# Patient Record
Sex: Female | Born: 2000 | Race: Black or African American | Hispanic: No | Marital: Single | Smoking: Former smoker
Health system: Southern US, Community
[De-identification: ages and names within clinical notes are randomized; demographics above are authoritative.]

## PROBLEM LIST (undated history)

## (undated) DIAGNOSIS — F17200 Nicotine dependence, unspecified, uncomplicated: Secondary | ICD-10-CM

## (undated) DIAGNOSIS — E119 Type 2 diabetes mellitus without complications: Secondary | ICD-10-CM

## (undated) DIAGNOSIS — F1210 Cannabis abuse, uncomplicated: Secondary | ICD-10-CM

## (undated) DIAGNOSIS — E785 Hyperlipidemia, unspecified: Secondary | ICD-10-CM

## (undated) DIAGNOSIS — E8809 Other disorders of plasma-protein metabolism, not elsewhere classified: Secondary | ICD-10-CM

## (undated) DIAGNOSIS — N059 Unspecified nephritic syndrome with unspecified morphologic changes: Secondary | ICD-10-CM

---
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
## 2019-07-28 ENCOUNTER — Other Ambulatory Visit: Payer: Self-pay

## 2019-07-28 ENCOUNTER — Emergency Department: Payer: PPO

## 2019-07-28 ENCOUNTER — Encounter: Payer: Self-pay | Admitting: Emergency Medicine

## 2019-07-28 ENCOUNTER — Inpatient Hospital Stay
Admission: EM | Admit: 2019-07-28 | Discharge: 2019-07-31 | DRG: 919 | Disposition: A | Discharge status: Home / Self Care | Payer: PPO | Attending: Internal Medicine | Admitting: Internal Medicine

## 2019-07-28 DIAGNOSIS — E876 Hypokalemia: Secondary | ICD-10-CM | POA: Diagnosis present

## 2019-07-28 DIAGNOSIS — Z9641 Presence of insulin pump (external) (internal): Secondary | ICD-10-CM | POA: Diagnosis present

## 2019-07-28 DIAGNOSIS — E1010 Type 1 diabetes mellitus with ketoacidosis without coma: Secondary | ICD-10-CM | POA: Diagnosis present

## 2019-07-28 DIAGNOSIS — Z87441 Personal history of nephrotic syndrome: Secondary | ICD-10-CM | POA: Diagnosis not present

## 2019-07-28 DIAGNOSIS — E872 Acidosis, unspecified: Secondary | ICD-10-CM | POA: Insufficient documentation

## 2019-07-28 DIAGNOSIS — Z20822 Contact with and (suspected) exposure to covid-19: Secondary | ICD-10-CM | POA: Diagnosis present

## 2019-07-28 DIAGNOSIS — T383X6A Underdosing of insulin and oral hypoglycemic [antidiabetic] drugs, initial encounter: Secondary | ICD-10-CM | POA: Diagnosis present

## 2019-07-28 DIAGNOSIS — E871 Hypo-osmolality and hyponatremia: Secondary | ICD-10-CM | POA: Diagnosis present

## 2019-07-28 DIAGNOSIS — N179 Acute kidney failure, unspecified: Secondary | ICD-10-CM | POA: Diagnosis present

## 2019-07-28 DIAGNOSIS — T85614A Breakdown (mechanical) of insulin pump, initial encounter: Principal | ICD-10-CM | POA: Diagnosis present

## 2019-07-28 DIAGNOSIS — Z794 Long term (current) use of insulin: Secondary | ICD-10-CM | POA: Diagnosis not present

## 2019-07-28 DIAGNOSIS — E1110 Type 2 diabetes mellitus with ketoacidosis without coma: Secondary | ICD-10-CM | POA: Diagnosis present

## 2019-07-28 DIAGNOSIS — R0602 Shortness of breath: Secondary | ICD-10-CM

## 2019-07-28 DIAGNOSIS — E1021 Type 1 diabetes mellitus with diabetic nephropathy: Secondary | ICD-10-CM | POA: Insufficient documentation

## 2019-07-28 DIAGNOSIS — R000 Tachycardia, unspecified: Secondary | ICD-10-CM | POA: Diagnosis not present

## 2019-07-28 DIAGNOSIS — Z7952 Long term (current) use of systemic steroids: Secondary | ICD-10-CM | POA: Diagnosis not present

## 2019-07-28 DIAGNOSIS — R739 Hyperglycemia, unspecified: Secondary | ICD-10-CM | POA: Diagnosis not present

## 2019-07-28 LAB — COMPREHENSIVE METABOLIC PANEL
ALT: 17 U/L (ref 0–44)
AST: 17 U/L (ref 15–41)
Albumin: 2.3 g/dL — ABNORMAL LOW (ref 3.5–5.0)
Alkaline Phosphatase: 141 U/L — ABNORMAL HIGH (ref 38–126)
Anion gap: 19 — ABNORMAL HIGH (ref 5–15)
BUN: 16 mg/dL (ref 6–20)
CO2: 11 mmol/L — ABNORMAL LOW (ref 22–32)
Calcium: 8.9 mg/dL (ref 8.9–10.3)
Chloride: 102 mmol/L (ref 98–111)
Creatinine, Ser: 0.86 mg/dL (ref 0.44–1.00)
GFR calc Af Amer: >60 mL/min (ref >60)
GFR calc non Af Amer: >60 mL/min (ref >60)
Glucose, Bld: 389 mg/dL — ABNORMAL HIGH (ref 70–99)
Potassium: 4.7 mmol/L (ref 3.5–5.1)
Sodium: 132 mmol/L — ABNORMAL LOW (ref 135–145)
Total Bilirubin: 1.2 mg/dL (ref 0.3–1.2)
Total Protein: 7.2 g/dL (ref 6.5–8.1)

## 2019-07-28 LAB — RESPIRATORY PANEL BY RT PCR (FLU A&B, COVID)
Influenza A by PCR: NEGATIVE
Influenza B by PCR: NEGATIVE
SARS Coronavirus 2 by RT PCR: NEGATIVE

## 2019-07-28 LAB — BASIC METABOLIC PANEL
Anion gap: 15 (ref 5–15)
BUN: 14 mg/dL (ref 6–20)
CO2: 11 mmol/L — ABNORMAL LOW (ref 22–32)
Calcium: 7.7 mg/dL — ABNORMAL LOW (ref 8.9–10.3)
Chloride: 110 mmol/L (ref 98–111)
Creatinine, Ser: 0.66 mg/dL (ref 0.44–1.00)
GFR calc Af Amer: >60 mL/min (ref >60)
GFR calc non Af Amer: >60 mL/min (ref >60)
Glucose, Bld: 238 mg/dL — ABNORMAL HIGH (ref 70–99)
Potassium: 4 mmol/L (ref 3.5–5.1)
Sodium: 136 mmol/L (ref 135–145)

## 2019-07-28 LAB — GLUCOSE, CAPILLARY
Glucose-Capillary: 121 mg/dL — ABNORMAL HIGH (ref 70–99)
Glucose-Capillary: 232 mg/dL — ABNORMAL HIGH (ref 70–99)
Glucose-Capillary: 368 mg/dL — ABNORMAL HIGH (ref 70–99)
Glucose-Capillary: 431 mg/dL — ABNORMAL HIGH (ref 70–99)
Glucose-Capillary: 447 mg/dL — ABNORMAL HIGH (ref 70–99)

## 2019-07-28 LAB — CBC WITH DIFFERENTIAL/PLATELET
Abs Immature Granulocytes: 0.09 10*3/uL — ABNORMAL HIGH (ref 0.00–0.07)
Basophils Absolute: 0.1 10*3/uL (ref 0.0–0.1)
Basophils Relative: 1 %
Eosinophils Absolute: 0.1 10*3/uL (ref 0.0–0.5)
Eosinophils Relative: 0 %
HCT: 51.5 % — ABNORMAL HIGH (ref 36.0–46.0)
Hemoglobin: 17.4 g/dL — ABNORMAL HIGH (ref 12.0–15.0)
Immature Granulocytes: 1 %
Lymphocytes Relative: 15 %
Lymphs Abs: 2 10*3/uL (ref 0.7–4.0)
MCH: 28.6 pg (ref 26.0–34.0)
MCHC: 33.8 g/dL (ref 30.0–36.0)
MCV: 84.6 fL (ref 80.0–100.0)
Monocytes Absolute: 0.4 10*3/uL (ref 0.1–1.0)
Monocytes Relative: 3 %
Neutro Abs: 10.1 10*3/uL — ABNORMAL HIGH (ref 1.7–7.7)
Neutrophils Relative %: 80 %
Platelets: 440 10*3/uL — ABNORMAL HIGH (ref 150–400)
RBC: 6.09 MIL/uL — ABNORMAL HIGH (ref 3.87–5.11)
RDW: 13.4 % (ref 11.5–15.5)
WBC: 12.7 10*3/uL — ABNORMAL HIGH (ref 4.0–10.5)
nRBC: 0 % (ref 0.0–0.2)

## 2019-07-28 LAB — HEMOGLOBIN A1C
Hgb A1c MFr Bld: 12.9 % — ABNORMAL HIGH (ref 4.8–5.6)
Mean Plasma Glucose: 323.53 mg/dL

## 2019-07-28 LAB — BETA-HYDROXYBUTYRIC ACID: Beta-Hydroxybutyric Acid: 6.17 mmol/L — ABNORMAL HIGH (ref 0.05–0.27)

## 2019-07-28 MED ORDER — DEXTROSE IN LACTATED RINGERS 5 % IV SOLN: INTRAVENOUS | Status: DC

## 2019-07-28 MED ORDER — LACTATED RINGERS IV SOLN: INTRAVENOUS | Status: DC

## 2019-07-28 MED ORDER — INSULIN REGULAR(HUMAN) IN NACL 100-0.9 UT/100ML-% IV SOLN
INTRAVENOUS | Status: DC
  Filled 2019-07-28: qty 100

## 2019-07-28 MED ORDER — ENOXAPARIN SODIUM 40 MG/0.4ML ~~LOC~~ SOLN
40.0000 mg | SUBCUTANEOUS | Status: DC
  Administered 2019-07-28 – 2019-07-29 (×2): 40 mg via SUBCUTANEOUS
  Filled 2019-07-28 (×2): qty 0.4

## 2019-07-28 MED ORDER — DEXTROSE 50 % IV SOLN: 0.0000 mL | INTRAVENOUS | Status: DC | PRN

## 2019-07-28 MED ORDER — POTASSIUM CHLORIDE 10 MEQ/100ML IV SOLN
10.0000 meq | INTRAVENOUS | Status: AC
  Filled 2019-07-28 (×2): qty 100

## 2019-07-28 MED ORDER — ACETAMINOPHEN 500 MG PO TABS
500.0000 mg | ORAL_TABLET | Freq: Four times a day (QID) | ORAL | Status: DC | PRN
  Administered 2019-07-28: 18:00:00 500 mg via ORAL
  Filled 2019-07-28: qty 1

## 2019-07-28 MED ORDER — DEXTROSE-NACL 5-0.45 % IV SOLN: INTRAVENOUS | Status: DC

## 2019-07-28 MED ORDER — PANTOPRAZOLE SODIUM 40 MG PO TBEC
40.0000 mg | DELAYED_RELEASE_TABLET | Freq: Every day | ORAL | Status: DC
  Administered 2019-07-29 – 2019-07-31 (×3): 40 mg via ORAL
  Filled 2019-07-28 (×3): qty 1

## 2019-07-28 MED ORDER — SODIUM CHLORIDE 0.9 % IV SOLN: INTRAVENOUS | Status: DC

## 2019-07-28 MED ORDER — POTASSIUM CHLORIDE 10 MEQ/100ML IV SOLN: 10.0000 meq | INTRAVENOUS | Status: DC

## 2019-07-28 MED ORDER — PHENOL 1.4 % MT LIQD
1.0000 | OROMUCOSAL | Status: DC | PRN
  Filled 2019-07-28: qty 177

## 2019-07-28 MED ORDER — INSULIN REGULAR(HUMAN) IN NACL 100-0.9 UT/100ML-% IV SOLN: INTRAVENOUS | Status: DC

## 2019-07-28 MED ORDER — TACROLIMUS 1 MG PO CAPS
2.0000 mg | ORAL_CAPSULE | Freq: Two times a day (BID) | ORAL | Status: DC
  Administered 2019-07-28 – 2019-07-31 (×5): 2 mg via ORAL
  Filled 2019-07-28 (×8): qty 2

## 2019-07-28 MED ORDER — INSULIN ASPART 100 UNIT/ML ~~LOC~~ SOLN
300.0000 [IU] | Freq: Once | SUBCUTANEOUS | Status: AC
  Administered 2019-07-28: 16:00:00 300 [IU] via SUBCUTANEOUS
  Filled 2019-07-28: qty 1

## 2019-07-28 NOTE — ED Provider Notes (Signed)
[facility] Department Provider Note   ____________________________________________    I have reviewed the triage vital signs and the nursing notes.   HISTORY  Chief Complaint Shortness of Breath and Emesis     HPI Catherine [name] is a 19 y.o. female who presents with complaints of mild shortness of breath, nausea and vomiting.  Patient reports the symptoms started early this morning.  She does have a history of diabetes.  She denies fevers or chills.  No body aches.  No cough.  Normal stools.  No sick contacts reported.  No pleurisy, has not take anything for this  Past Medical History:  Diagnosis Date  . Diabetes mellitus without complication (HCC)   . Nephritic syndrome     There are no problems to display for this patient.   History reviewed. No pertinent surgical history.  Prior to Admission medications   Medication Sig Start Date End Date Taking? Authorizing Provider  furosemide (LASIX) 20 MG tablet Take 20 mg by mouth daily. 12/30/16   [provider]  NOVOLOG 100 UNIT/ML injection  01/08/17   [provider]  omeprazole (PRILOSEC) 20 MG capsule Take 20 mg by mouth daily. 12/30/16   [provider]  omeprazole (PRILOSEC) 20 MG capsule Take 20 mg by mouth. 12/30/16 12/30/17  [provider]  predniSONE (DELTASONE) 10 MG tablet take 5 tablets by mouth daily (50 MG TOTAL)..CHECK FIRST MORNING ...  (REFER TO PRESCRIPTION NOTES). 12/26/16   [provider]  tacrolimus (PROGRAF) 1 MG capsule Take 2 mg by mouth 2 (two) times daily. 12/30/16   [provider]     Allergies Patient has no known allergies.  No family history on file.  Social History Social History   Tobacco Use  . Smoking status: Never Smoker  . Smokeless tobacco: Never Used  Substance Use Topics  . Alcohol use: Not Currently  . Drug use: Not on file    Review of Systems  Constitutional: No fever/chills  Eyes: No visual changes.  ENT: No sore throat. Cardiovascular: Denies chest pain. Respiratory: As above Gastrointestinal: No abdominal pain, nausea and vomiting Genitourinary: Negative for dysuria. Musculoskeletal: Negative for back pain. Skin: Negative for rash. Neurological: Negative for headaches or weakness   ____________________________________________   PHYSICAL EXAM:  VITAL SIGNS: ED Triage Vitals  Enc Vitals Group     BP 07/28/19 1108 130/83     Pulse Rate 07/28/19 1108 95     Resp 07/28/19 1108 (!) 28     Temp 07/28/19 1108 97.8 F (36.6 C)     Temp Source 07/28/19 1108 Oral     SpO2 07/28/19 1108 98 %     Weight 07/28/19 1109 56.7 kg (125 lb)     Height 07/28/19 1109 1.499 m (4\' 11" )     Head Circumference --      Peak Flow --      Pain Score 07/28/19 1108 7     Pain Loc --      Pain Edu? --      Excl. in GC? --     Constitutional: Alert and oriented.  Eyes: Conjunctivae are normal.   Nose: No congestion/rhinnorhea. Mouth/Throat: Mucous membranes are moist.   Neck:  Painless ROM Cardiovascular: Initially tachycardic grossly normal heart sounds.  Good peripheral circulation. Respiratory: Mild tachypnea no retractions. Lungs CTAB. Gastrointestinal: Soft and nontender. No distention.  No CVA tenderness. Genitourinary: deferred Musculoskeletal: No lower extremity tenderness nor edema.  Warm and  well perfused Neurologic:  Normal speech and language. No gross focal neurologic deficits are appreciated.  Skin:  Skin is warm, dry and intact. No rash noted. Psychiatric: Mood and affect are normal. Speech and behavior are normal.  ____________________________________________   LABS (all labs ordered are listed, but only abnormal results are displayed)  Labs Reviewed  CBC WITH DIFFERENTIAL/PLATELET - Abnormal; Notable for the following components:      Result Value   WBC 12.7 (*)    RBC 6.09 (*)    Hemoglobin 17.4 (*)    HCT 51.5 (*)    Platelets 440 (*)     Neutro Abs 10.1 (*)    Abs Immature Granulocytes 0.09 (*)    All other components within normal limits  COMPREHENSIVE METABOLIC PANEL - Abnormal; Notable for the following components:   Sodium 132 (*)    CO2 11 (*)    Glucose, Bld 389 (*)    Albumin 2.3 (*)    Alkaline Phosphatase 141 (*)    Anion gap 19 (*)    All other components within normal limits  GLUCOSE, CAPILLARY - Abnormal; Notable for the following components:   Glucose-Capillary 368 (*)    All other components within normal limits  BLOOD GAS, VENOUS - Abnormal; Notable for the following components:   pH, Ven 7.13 (*)    pCO2, Ven 36 (*)    Bicarbonate 12.0 (*)    Acid-base deficit 16.3 (*)    All other components within normal limits  RESPIRATORY PANEL BY RT PCR (FLU A&B, COVID)  CBG MONITORING, ED  POC URINE PREG, ED   ____________________________________________  EKG  ED ECG REPORT I, Jene Every, the attending physician, personally viewed and interpreted this ECG.  Date: 07/28/2019  Rhythm: normal sinus rhythm QRS Axis: normal Intervals: normal ST/T Wave abnormalities: normal Narrative Interpretation: no evidence of acute ischemia  ____________________________________________  RADIOLOGY  Chest x-ray unremarkable ____________________________________________   PROCEDURES  Procedure(s) performed: No  Procedures   Critical Care performed: yes  CRITICAL CARE Performed by: Jene Every   Total critical care time: 30 minutes  Critical care time was exclusive of separately billable procedures and treating other patients.  Critical care was necessary to treat or prevent imminent or life-threatening deterioration.  Critical care was time spent personally by me on the following activities: development of treatment plan with patient and/or surrogate as well as nursing, discussions with consultants, evaluation of patient's response to treatment, examination of patient, obtaining history from  patient or surrogate, ordering and performing treatments and interventions, ordering and review of laboratory studies, ordering and review of radiographic studies, pulse oximetry and re-evaluation of patient's condition.  ____________________________________________   INITIAL IMPRESSION / ASSESSMENT AND PLAN / ED COURSE  Pertinent labs & imaging results that were available during my care of the patient were reviewed by me and considered in my medical decision making (see chart for details).  Patient presents with nausea some episodes of vomiting, found to be somewhat tachypneic, clear to auscultation.  Mildly tachycardic upon arrival.  Glucose elevated very suspicious for DKA, confirmed by CMP and VBG.  Anion gap is 19, mild hyponatremia related to elevated glucose.  VBG demonstrates pH of 7.13 consistent with DKA.  Patient does have an insulin pump, this was disconnected in the emergency department.  IV fluid bolus, IV insulin drip ordered discussed with Dr. Enedina Finner of hospitalist service for admission    ____________________________________________   FINAL CLINICAL IMPRESSION(S) / ED DIAGNOSES  Final diagnoses:  Diabetic ketoacidosis without coma associated with type 1 diabetes mellitus ([place])        Note:  This document was prepared using Dragon voice recognition software and may include unintentional dictation errors.   Lavonia Drafts, MD 07/28/19 1314

## 2019-07-28 NOTE — Progress Notes (Addendum)
Inpatient Diabetes Program Recommendations  AACE/ADA: New Consensus Statement on Inpatient Glycemic Control (2015)  Target Ranges:  Prepandial:   less than 140 mg/dL      Peak postprandial:   less than 180 mg/dL (1-2 hours)      Critically ill patients:  140 - 180 mg/dL   Lab Results  Component Value Date   GLUCAP 368 (H) 07/28/2019    Review of Glycemic Control Results for Catherine [name], Catherine [name] (MRN [id number]) as of 07/28/2019 13:20  Ref. Range 07/28/2019 11:23  Glucose-Capillary Latest Ref Range: 70 - 99 mg/dL 226 (H)    Diabetes history: Type 1 DM (does not make insulin) Outpatient Diabetes medications: Novolog via [employer] insulin pump Basal setting on pump is 37.8 units daily Current orders for Inpatient glycemic control: IV insulin  Note: Spoke with patient at bedside.  She is a 18yo with T1D presenting with hyperglycemia and vomiting twice.  She is being hydrated with IVF and will start on IV insulin.  Pump has been removed.  Asked patient to remove site from her right thigh so we could examine if kinked.  It was slightly bent.  Explained to patient that the thigh is not the best place for the insulin pump site as there is not a lot of fatty tissue there.  She explained that her pediatric endocrinologists told to not to use her belly because she had a lot of scar tissue from injections years ago.  Examined and palpated belly with MD and did not notice any signs of scar tissue.  MD agrees she could start inserting her sites on the abdomen.    Patient states she is switching to Dr. Tedd Sias from [facility] and has an appointment in February.  In Care Everywhere, her last A1C was >14% on 05/08/19.  MD has ordered a new A1C.  We discussed the long term risks of high blood glucose levels and she is aware she needs to focus on bring her BS's down.  She drinks diet drinks and states she tries to watch her CHO intake but "it's hard".  She has a Dexcom but has not been wearing it  lately.  She said she is going to start wearing it again and recently ordered more supplies.  She denies difficulties obtaining medications or supplies.  She checks her CBG 2-3 times daily..  Encouraged her to check AC and HS while not wearing Dexcom.   Will continue to follow.  Addendum @ 1545-  MD called this RN and reported that patients gap had closed; did not need IV insulin.  Recommended going back on insulin pump.  Reservoir filled with Novolog and tubing primed.  New site placed on left abdomen.  MD made aware via secure chat.     Thank you, Dulce Sellar, RN, BSN Diabetes Coordinator Inpatient Diabetes Program [phone] (team pager from 8a-5p)

## 2019-07-28 NOTE — ED Triage Notes (Signed)
Pt presents to ED via POV with c/o SOB since last night, emesis since last night as well. Pt states 2 episodes of vomiting since last night. Pt states hx of Type 1 DM. Pt ambulatory without difficulty, A&O x4, pt noted to be SOB upon arrival to triage room.

## 2019-07-28 NOTE — ED Notes (Signed)
Pt's care discussed with Dr. Erma Heritage, see orders. Pt states at home CBG has been 250-270 with her insulin pump. Per Dr. Erma Heritage, no troponin at this time.

## 2019-07-28 NOTE — H&P (Addendum)
[facility] [place] NAME: Chryl Holten    MR#:  [id number]  DATE OF BIRTH:  [birth year]  DATE OF ADMISSION:  07/28/2019  PRIMARY CARE PHYSICIAN: [facility] [place]   REQUESTING/REFERRING PHYSICIAN: Dr. Corky Downs  Patient coming from : home   CHIEF COMPLAINT:   My sugars have been high since yesterday I was not feeling well for 1 to 2 days HISTORY OF PRESENT ILLNESS:  Tiffini Blacksher  is a 19 y.o. female with a known history of type I diabetes on insulin pump, nephrotic syndrome at age 86 now off steroids how were on chronic oral tacrolimus follows with [facility] nephrology comes to the emergency room with generalized weakness, malaise and sugars in the 350s. Patient had emesis this morning. Came to the emergency room was found to be in DKA. Her anion gap is 19. Denies any fever, shortness of breath, diarrhea, loss of taste, recent travel. She hasn't thrown up yet and feels okay so far. Was hemodynamically stable receiving IV fluids and will be started on IV insulin drip.  Patient's insulin pump was not pumping insulin adequately and will need to be evaluated. Believes she was low on her medication.  ED course: in the ER patient is hemodynamically stable. She has anion gap metabolic acidosis receiving IV fluids and need to be started on insulin drip. Diabetes coordinator visited patient in the ER.  PAST MEDICAL HISTORY:   Past Medical History:  Diagnosis Date  . Diabetes mellitus without complication ([place])   . Nephritic syndrome     PAST SURGICAL HISTOIRY:  History reviewed. No pertinent surgical history.  SOCIAL HISTORY:   Social History   Tobacco Use  . Smoking status: Never Smoker  . Smokeless tobacco: Never Used  Substance Use Topics  . Alcohol use: Not Currently    FAMILY HISTORY:  No family history on file.  DRUG ALLERGIES:  No Known Allergies  REVIEW OF SYSTEMS:  Review of Systems  Constitutional:  Positive for malaise/fatigue. Negative for chills, fever and weight loss.  HENT: Negative for ear discharge, ear pain and nosebleeds.   Eyes: Negative for blurred vision, pain and discharge.  Respiratory: Negative for sputum production, shortness of breath, wheezing and stridor.   Cardiovascular: Negative for chest pain, palpitations, orthopnea and PND.  Gastrointestinal: Positive for nausea and vomiting. Negative for abdominal pain and diarrhea.  Genitourinary: Negative for frequency and urgency.  Musculoskeletal: Negative for back pain and joint pain.  Neurological: Negative for sensory change, speech change, focal weakness and weakness.  Psychiatric/Behavioral: Negative for depression and hallucinations. The patient is not nervous/anxious.      MEDICATIONS AT HOME:   Prior to Admission medications   Medication Sig Start Date End Date Taking? Authorizing Provider  NOVOLOG 100 UNIT/ML injection Inject 0-100 Units into the skin daily. Patient has insulin pump 01/08/17  Yes [provider]  tacrolimus (PROGRAF) 1 MG capsule Take 2 mg by mouth 2 (two) times daily. 12/30/16  Yes [provider]  furosemide (LASIX) 20 MG tablet Take 20 mg by mouth daily. 12/30/16   [provider]  Glucagon, rDNA, (GLUCAGON EMERGENCY) 1 MG KIT Inject 1 mg into the muscle once. 02/27/19   [provider]  omeprazole (PRILOSEC) 20 MG capsule Take 20 mg by mouth daily. 12/30/16   [provider]  omeprazole (PRILOSEC) 20 MG capsule Take 20 mg by mouth. 12/30/16 12/30/17  [provider]  predniSONE (DELTASONE) 10 MG tablet take 5  tablets by mouth daily (50 MG TOTAL)..CHECK FIRST MORNING ...  (REFER TO PRESCRIPTION NOTES). 12/26/16   [provider]      VITAL SIGNS:  Blood pressure 130/83, pulse 95, temperature 97.8 F (36.6 C), temperature source Oral, resp. rate (!) 28, height 4' 11"  (1.499 m), weight 56.7 kg, SpO2 98 %.  PHYSICAL EXAMINATION:  GENERAL:   19 y.o.-year-old patient lying in the bed with no acute distress.  EYES: Pupils equal, round, reactive to light and accommodation. No scleral icterus.  HEENT: Head atraumatic, normocephalic. Oropharynx and nasopharynx clear.  NECK:  Supple, no jugular venous distention. No thyroid enlargement, no tenderness.  LUNGS: Normal breath sounds bilaterally, no wheezing, rales,rhonchi or crepitation. No use of accessory muscles of respiration.  CARDIOVASCULAR: S1, S2 normal. No murmurs, rubs, or gallops.  ABDOMEN: Soft, nontender, nondistended. Bowel sounds present. No organomegaly or mass.  EXTREMITIES: No pedal edema, cyanosis, or clubbing.  NEUROLOGIC: Cranial nerves II through XII are intact. Muscle strength 5/5 in all extremities. Sensation intact. Gait not checked.  PSYCHIATRIC: The patient is alert and oriented x 3.  SKIN: No obvious rash, lesion, or ulcer.   LABORATORY PANEL:   CBC Recent Labs  Lab 07/28/19 1111  WBC 12.7*  HGB 17.4*  HCT 51.5*  PLT 440*   ------------------------------------------------------------------------------------------------------------------  Chemistries  Recent Labs  Lab 07/28/19 1111  NA 132*  K 4.7  CL 102  CO2 11*  GLUCOSE 389*  BUN 16  CREATININE 0.86  CALCIUM 8.9  AST 17  ALT 17  ALKPHOS 141*  BILITOT 1.2   ------------------------------------------------------------------------------------------------------------------  Cardiac Enzymes No results for input(s): TROPONINI in the last 168 hours. ------------------------------------------------------------------------------------------------------------------  RADIOLOGY:  DG Chest 2 View  Result Date: 07/28/2019 CLINICAL DATA:  Shortness of breath, emesis EXAM: CHEST - 2 VIEW COMPARISON:  05/29/2018 FINDINGS: The heart size and mediastinal contours are within normal limits. Both lungs are clear. The visualized skeletal structures are unremarkable. IMPRESSION: No acute abnormality of the  lungs. Electronically Signed   By: Eddie Candle M.D.   On: 07/28/2019 11:41    EKG:    IMPRESSION AND PLAN:   Zeola Brys  is a 19 y.o. female with a known history of type I diabetes on insulin pump, nephrotic syndrome at age 60 now off steroids how were on chronic oral tacrolimus follows with [facility] nephrology comes to the emergency room with generalized weakness, malaise and sugars in the 350s. Patient had emesis this morning. Came to the emergency room was found to be in DKA. Her anion gap is 19.  1. DKA in type I diabetes -admit to step down -IV insulin drip per EndoTool protocol -transition to insulin pump once anion gap closes -diabetes coordinator consultation -IV fluids -monitor labs per Endo tool protocol  2. History of nephrotic syndrome -patient is off steroids -continue tacrolimus  3. Low sodium suspected pseudo-hyponatremia in the setting of high sugars -it should correct with IV fluids and insulin drip  4. DVT prophylaxis subcu heparin  Family Communication : tried reaching to family member numbers listed-- unable to Consults : none Code Status : full DVT prophylaxis : Lovenox  TOTALcritical  TIME TAKING CARE OF THIS PATIENT: *50* minutes.    Fritzi Mandes M.D  [facility]     CC: Primary care physician; [facility]

## 2019-07-28 NOTE — Progress Notes (Signed)
Pharmacy Electrolyte Monitoring Consult:  Pharmacy consulted to assist in monitoring and replacing electrolytes in this 19 y.o. female admitted on 07/28/2019 with DKA.   Labs:  Sodium (mmol/L)  Date Value  07/28/2019 136   Potassium (mmol/L)  Date Value  07/28/2019 4.0   Magnesium (mg/dL)  Date Value  96/28/3662 1.3 (L)   Calcium (mg/dL)  Date Value  94/76/5465 7.7 (L)   Albumin (g/dL)  Date Value  03/54/6568 2.3 (L)    Assessment/Plan: Patient's gapped has closed and patient not currently ordered insulin infusion.   Plan is for patient to be placed back on insulin pump.   Will order BMP/Magnesium with am labs. Will replace to maintain electrolytes within normal limits.   Pharmacy will continue to monitor and adjust per consult.   Johnnay Pleitez L 07/28/2019 5:23 PM

## 2019-07-28 NOTE — Progress Notes (Signed)
Pharmacy Electrolyte Monitoring Consult:  Pharmacy consulted to assist in monitoring and replacing electrolytes in this 19 y.o. female admitted on 07/28/2019 with DKA.   Labs:  Sodium (mmol/L)  Date Value  07/28/2019 136   Potassium (mmol/L)  Date Value  07/28/2019 4.0   Magnesium (mg/dL)  Date Value  51/04/2110 1.3 (L)   Calcium (mg/dL)  Date Value  73/56/7014 7.7 (L)   Albumin (g/dL)  Date Value  05/01/1313 2.3 (L)    Assessment/Plan: Patient's gapped has closed and patient not currently ordered insulin infusion.   Plan is for patient to be placed back on insulin pump.   LR infusing at 111mL/hr.   Will order BMP/Magnesium with am labs. Will replace to maintain electrolytes within normal limits.   Pharmacy will continue to monitor and adjust per consult.   Nole Robey L 07/28/2019 5:25 PM

## 2019-07-28 NOTE — Progress Notes (Signed)
Notified Webb Silversmith, NP: Following up on pt in 229, her BG was 431. She has an insulin pump, and gave herself 10.1 units. Just wanted to make you aware, thanks!  NP comment: "sure usually we dc the pump but not sure why she is still on it, will ask diabetes co-ordinator in the am"

## 2019-07-28 NOTE — Progress Notes (Signed)
Patient ID: Catherine [name], female   DOB: 2000-08-30, 19 y.o.   MRN: [id number] patient's anion gap is close. She is feeling little better. Bicarb is 11 continue IV fluids with normal saline. Discussed with diabetes coordinator Dulce Sellar. She will ask patient's mother to bring her insulin pump and start her back on insulin pump. Will start carb control diet. Admit to MedSurg.

## 2019-07-29 ENCOUNTER — Other Ambulatory Visit: Payer: Self-pay

## 2019-07-29 ENCOUNTER — Inpatient Hospital Stay: Payer: PPO

## 2019-07-29 DIAGNOSIS — Z87441 Personal history of nephrotic syndrome: Secondary | ICD-10-CM

## 2019-07-29 DIAGNOSIS — R000 Tachycardia, unspecified: Secondary | ICD-10-CM

## 2019-07-29 DIAGNOSIS — E876 Hypokalemia: Secondary | ICD-10-CM

## 2019-07-29 LAB — BASIC METABOLIC PANEL
Anion gap: 21 — ABNORMAL HIGH (ref 5–15)
Anion gap: 12 (ref 5–15)
Anion gap: 11 (ref 5–15)
Anion gap: 8 (ref 5–15)
BUN: 15 mg/dL (ref 6–20)
BUN: 15 mg/dL (ref 6–20)
BUN: 12 mg/dL (ref 6–20)
BUN: 11 mg/dL (ref 6–20)
BUN: 13 mg/dL (ref 6–20)
CO2: <7 mmol/L — ABNORMAL LOW (ref 22–32)
CO2: 10 mmol/L — ABNORMAL LOW (ref 22–32)
CO2: 17 mmol/L — ABNORMAL LOW (ref 22–32)
CO2: 18 mmol/L — ABNORMAL LOW (ref 22–32)
CO2: 23 mmol/L (ref 22–32)
Calcium: 8.5 mg/dL — ABNORMAL LOW (ref 8.9–10.3)
Calcium: 7.8 mg/dL — ABNORMAL LOW (ref 8.9–10.3)
Calcium: 7.7 mg/dL — ABNORMAL LOW (ref 8.9–10.3)
Calcium: 7.4 mg/dL — ABNORMAL LOW (ref 8.9–10.3)
Calcium: 7.5 mg/dL — ABNORMAL LOW (ref 8.9–10.3)
Chloride: 103 mmol/L (ref 98–111)
Chloride: 106 mmol/L (ref 98–111)
Chloride: 105 mmol/L (ref 98–111)
Chloride: 105 mmol/L (ref 98–111)
Chloride: 106 mmol/L (ref 98–111)
Creatinine, Ser: 1.05 mg/dL — ABNORMAL HIGH (ref 0.44–1.00)
Creatinine, Ser: 0.96 mg/dL (ref 0.44–1.00)
Creatinine, Ser: 0.86 mg/dL (ref 0.44–1.00)
Creatinine, Ser: 0.76 mg/dL (ref 0.44–1.00)
Creatinine, Ser: 0.79 mg/dL (ref 0.44–1.00)
GFR calc Af Amer: >60 mL/min (ref >60)
GFR calc Af Amer: >60 mL/min (ref >60)
GFR calc Af Amer: >60 mL/min (ref >60)
GFR calc Af Amer: >60 mL/min (ref >60)
GFR calc Af Amer: >60 mL/min (ref >60)
GFR calc non Af Amer: >60 mL/min (ref >60)
GFR calc non Af Amer: >60 mL/min (ref >60)
GFR calc non Af Amer: >60 mL/min (ref >60)
GFR calc non Af Amer: >60 mL/min (ref >60)
GFR calc non Af Amer: >60 mL/min (ref >60)
Glucose, Bld: 581 mg/dL (ref 70–99)
Glucose, Bld: 343 mg/dL — ABNORMAL HIGH (ref 70–99)
Glucose, Bld: 211 mg/dL — ABNORMAL HIGH (ref 70–99)
Glucose, Bld: 245 mg/dL — ABNORMAL HIGH (ref 70–99)
Glucose, Bld: 179 mg/dL — ABNORMAL HIGH (ref 70–99)
Potassium: 4.1 mmol/L (ref 3.5–5.1)
Potassium: 3.4 mmol/L — ABNORMAL LOW (ref 3.5–5.1)
Potassium: 3.1 mmol/L — ABNORMAL LOW (ref 3.5–5.1)
Potassium: 3.8 mmol/L (ref 3.5–5.1)
Potassium: 3.2 mmol/L — ABNORMAL LOW (ref 3.5–5.1)
Sodium: 131 mmol/L — ABNORMAL LOW (ref 135–145)
Sodium: 137 mmol/L (ref 135–145)
Sodium: 134 mmol/L — ABNORMAL LOW (ref 135–145)
Sodium: 134 mmol/L — ABNORMAL LOW (ref 135–145)
Sodium: 137 mmol/L (ref 135–145)

## 2019-07-29 LAB — GLUCOSE, CAPILLARY
Glucose-Capillary: 522 mg/dL (ref 70–99)
Glucose-Capillary: 560 mg/dL (ref 70–99)
Glucose-Capillary: 477 mg/dL — ABNORMAL HIGH (ref 70–99)
Glucose-Capillary: 529 mg/dL (ref 70–99)
Glucose-Capillary: 362 mg/dL — ABNORMAL HIGH (ref 70–99)
Glucose-Capillary: 278 mg/dL — ABNORMAL HIGH (ref 70–99)
Glucose-Capillary: 228 mg/dL — ABNORMAL HIGH (ref 70–99)
Glucose-Capillary: 264 mg/dL — ABNORMAL HIGH (ref 70–99)
Glucose-Capillary: 255 mg/dL — ABNORMAL HIGH (ref 70–99)
Glucose-Capillary: 207 mg/dL — ABNORMAL HIGH (ref 70–99)
Glucose-Capillary: 235 mg/dL — ABNORMAL HIGH (ref 70–99)
Glucose-Capillary: 184 mg/dL — ABNORMAL HIGH (ref 70–99)
Glucose-Capillary: 184 mg/dL — ABNORMAL HIGH (ref 70–99)
Glucose-Capillary: 159 mg/dL — ABNORMAL HIGH (ref 70–99)
Glucose-Capillary: 172 mg/dL — ABNORMAL HIGH (ref 70–99)
Glucose-Capillary: 220 mg/dL — ABNORMAL HIGH (ref 70–99)
Glucose-Capillary: 312 mg/dL — ABNORMAL HIGH (ref 70–99)
Glucose-Capillary: 204 mg/dL — ABNORMAL HIGH (ref 70–99)
Glucose-Capillary: 213 mg/dL — ABNORMAL HIGH (ref 70–99)
Glucose-Capillary: 189 mg/dL — ABNORMAL HIGH (ref 70–99)
Glucose-Capillary: 200 mg/dL — ABNORMAL HIGH (ref 70–99)

## 2019-07-29 LAB — BLOOD GAS, ARTERIAL
Acid-base deficit: 24.2 mmol/L — ABNORMAL HIGH (ref 0.0–2.0)
Bicarbonate: 3.1 mmol/L — ABNORMAL LOW (ref 20.0–28.0)
FIO2: 0.36
O2 Saturation: 98.9 %
Patient temperature: 37
pCO2 arterial: <19 mmHg — CL (ref 32.0–48.0)
pH, Arterial: 7.1 — CL (ref 7.350–7.450)
pO2, Arterial: 164 mmHg — ABNORMAL HIGH (ref 83.0–108.0)

## 2019-07-29 LAB — MRSA PCR SCREENING: MRSA by PCR: NEGATIVE

## 2019-07-29 LAB — MAGNESIUM
Magnesium: 1.8 mg/dL (ref 1.7–2.4)
Magnesium: 1.7 mg/dL (ref 1.7–2.4)

## 2019-07-29 MED ORDER — SODIUM CHLORIDE 0.9 % IV SOLN: INTRAVENOUS | Status: DC

## 2019-07-29 MED ORDER — ONDANSETRON HCL 4 MG/2ML IJ SOLN: 4.0000 mg | Freq: Four times a day (QID) | INTRAMUSCULAR | Status: DC | PRN

## 2019-07-29 MED ORDER — MAGNESIUM SULFATE 2 GM/50ML IV SOLN
2.0000 g | Freq: Once | INTRAVENOUS | Status: AC
  Administered 2019-07-29: 12:00:00 2 g via INTRAVENOUS
  Filled 2019-07-29: qty 50

## 2019-07-29 MED ORDER — CHLORHEXIDINE GLUCONATE CLOTH 2 % EX PADS
6.0000 | MEDICATED_PAD | Freq: Every day | CUTANEOUS | Status: DC
  Administered 2019-07-29 – 2019-07-30 (×2): 6 via TOPICAL

## 2019-07-29 MED ORDER — SODIUM BICARBONATE 8.4 % IV SOLN: 150.0000 meq | Freq: Once | INTRAVENOUS | Status: AC

## 2019-07-29 MED ORDER — INSULIN REGULAR(HUMAN) IN NACL 100-0.9 UT/100ML-% IV SOLN
INTRAVENOUS | Status: DC
  Administered 2019-07-29: 05:00:00 10 [IU]/h via INTRAVENOUS
  Administered 2019-07-29: 8 [IU]/h via INTRAVENOUS
  Filled 2019-07-29 (×3): qty 100

## 2019-07-29 MED ORDER — POTASSIUM CHLORIDE CRYS ER 20 MEQ PO TBCR
40.0000 meq | EXTENDED_RELEASE_TABLET | Freq: Once | ORAL | Status: AC
  Administered 2019-07-29: 40 meq via ORAL
  Filled 2019-07-29: qty 2

## 2019-07-29 MED ORDER — LACTATED RINGERS IV SOLN
INTRAVENOUS | Status: DC
  Administered 2019-07-29: 05:00:00 50 mL/h via INTRAVENOUS

## 2019-07-29 MED ORDER — PROMETHAZINE HCL 25 MG/ML IJ SOLN
25.0000 mg | Freq: Four times a day (QID) | INTRAMUSCULAR | Status: DC | PRN
  Administered 2019-07-29: 01:00:00 25 mg via INTRAVENOUS
  Filled 2019-07-29: qty 1

## 2019-07-29 MED ORDER — DEXTROSE 50 % IV SOLN: 0.0000 mL | INTRAVENOUS | Status: DC | PRN

## 2019-07-29 MED ORDER — SODIUM BICARBONATE 8.4 % IV SOLN
INTRAVENOUS | Status: AC
  Administered 2019-07-29: 150 meq via INTRAVENOUS
  Filled 2019-07-29: qty 100

## 2019-07-29 MED ORDER — SODIUM CHLORIDE 0.9 % IV BOLUS
1000.0000 mL | Freq: Once | INTRAVENOUS | Status: AC
  Administered 2019-07-29: 04:00:00 1000 mL via INTRAVENOUS

## 2019-07-29 MED ORDER — POTASSIUM CHLORIDE CRYS ER 20 MEQ PO TBCR
40.0000 meq | EXTENDED_RELEASE_TABLET | Freq: Once | ORAL | Status: AC
  Administered 2019-07-29: 15:00:00 40 meq via ORAL
  Filled 2019-07-29: qty 2

## 2019-07-29 MED ORDER — INSULIN ASPART 100 UNIT/ML ~~LOC~~ SOLN
5.0000 [IU] | Freq: Once | SUBCUTANEOUS | Status: AC
  Administered 2019-07-29: 5 [IU] via INTRAVENOUS
  Filled 2019-07-29: qty 1

## 2019-07-29 MED ORDER — SODIUM BICARBONATE-DEXTROSE 150-5 MEQ/L-% IV SOLN
150.0000 meq | INTRAVENOUS | Status: DC
  Administered 2019-07-29 – 2019-07-30 (×3): 150 meq via INTRAVENOUS
  Filled 2019-07-29 (×3): qty 1000

## 2019-07-29 MED ORDER — SODIUM CHLORIDE 0.9 % IV BOLUS
250.0000 mL | Freq: Once | INTRAVENOUS | Status: AC
  Administered 2019-07-29: 01:00:00 250 mL via INTRAVENOUS

## 2019-07-29 MED ORDER — MAGNESIUM OXIDE 400 (241.3 MG) MG PO TABS
800.0000 mg | ORAL_TABLET | Freq: Every day | ORAL | Status: DC
  Administered 2019-07-29 – 2019-07-31 (×3): 800 mg via ORAL
  Filled 2019-07-29 (×3): qty 2

## 2019-07-29 NOTE — Progress Notes (Addendum)
Inpatient Diabetes Program Recommendations  AACE/ADA: New Consensus Statement on Inpatient Glycemic Control (2015)  Target Ranges:  Prepandial:   less than 140 mg/dL      Peak postprandial:   less than 180 mg/dL (1-2 hours)      Critically ill patients:  140 - 180 mg/dL   Lab Results  Component Value Date   GLUCAP 264 (H) 07/29/2019   HGBA1C 12.9 (H) 07/28/2019    Review of Glycemic Control  Results for Catherine [name], Catherine [name] (MRN [id number]) as of 07/29/2019 09:45  Ref. Range 07/29/2019 05:11 07/29/2019 05:54 07/29/2019 06:57 07/29/2019 08:07 07/29/2019 09:09  Glucose-Capillary Latest Ref Range: 70 - 99 mg/dL 940 (H) 768 (H) 088 (H) 228 (H) 264 (H)    Diabetes history: Type 1 Diabetes (patient does not make insulin) Outpatient Diabetes medications: IV Insulin Current orders for Inpatient glycemic control: Novolog via [employer] Insulin Pump  Note:  Patient placed on IV insulin after pump was restarted.  Questionable pump malfunction.  DM coordinator to patient's room this am to look at pump and noticed the retainer ring was missing.  Went to [employer] and printed recall information about missing retainer rings.  This can cause under or overdosing of insulin.  Patient has called her mother and her mother is on the phone currently with [employer] requesting a new pump.  [employer] should ship pump within 24hrs.  Patient states it is still under warranty. Will continue to follow.  Addendum @ 1525-Spoke with patient and she states her replacement pump will be delivered to her home tomorrow and mother can bring here to hospital.  Patient will tell her mother to bring new insulin pump site, reservoir and regular insulin.     MD-if transitioning off of IV insulin today please consider  -Lantus 15 units daily 1-2 hours prior to discontinuation of IV insulin -Novolog 0-9 units Q4  Pump can be placed on patient and run on normal mode if basal insulin has been administered approximately 24 hr  prior.      Thank you, Dulce Sellar, RN, BSN Diabetes Coordinator Inpatient Diabetes Program [phone] (team pager from 8a-5p)

## 2019-07-29 NOTE — Progress Notes (Signed)
Notified Webb Silversmith, NP, that CBG was rechecked and was 447 post 10.1 units that patient administered herself via insulin pump. Patient had a few vomiting episodes. New orders were placed to start bolus of NS & start continuously at 140mL/Hr. 5 units of insulin was given IV, as well as phenergan. Labs were drawn. VS stable.

## 2019-07-29 NOTE — Progress Notes (Signed)
Rechecked CBG which was 560. Pt became symptomatic and stated "I can't breathe, and there is pressure in my chest." Pt also stated that she was lightheaded. Notified NP, and called rapid response. VS were stable. Pt transferred to ICU, report given to [facility], [place].

## 2019-07-29 NOTE — Progress Notes (Signed)
Pt A&OX4. Tired due to not sleeping much overnight. Per pt mother her new insulin pump should arrive tomorrow. Insulin managed per Endo Tool. Potassium levels have improved.

## 2019-07-29 NOTE — Progress Notes (Signed)
PROGRESS NOTE    Catherine [name]  DPO:[id number] DOB: 07-Apr-2001 DOA: 07/28/2019 PCP: [facility] [place]    Brief Narrative:  Catherine [name]  is a 19 y.o. female with a known history of type I diabetes on insulin pump, nephrotic syndrome at age 25 now off steroids how were on chronic oral tacrolimus follows with [facility] nephrology comes to the emergency room with generalized weakness, malaise and sugars in the 350s. Patient had emesis Came to the emergency room was found to be in DKA. Her anion gap is 19. Patient's insulin pump was not pumping insulin adequately and will need to be evaluated. Believes she was low on her medication. But in actually the pump is not working (the ring around it is missing and mom has contacted [employer] which needs to be replaced prior to discharge)     Consultants:   Diabetic educator  Procedures: None  Antimicrobials:   none   Subjective: Pt feeling better. bg this am 264. Denies abd pain, nausea, vomiting, urinary symptoms.  Objective: Vitals:   07/28/19 2046 07/29/19 0108 07/29/19 0233 07/29/19 0425  BP: 112/69 116/71 130/77   Pulse: 100 94 (!) 109   Resp: 16 (!) 22 20   Temp: 97.9 F (36.6 C) 97.7 F (36.5 C)  97.8 F (36.6 C)  TempSrc: Oral Oral  Oral  SpO2: 100% 100%  100%  Weight:      Height:        Intake/Output Summary (Last 24 hours) at 07/29/2019 0759 Last data filed at 07/29/2019 0558 Gross per 24 hour  Intake --  Output 975 ml  Net -975 ml   Filed Weights   07/28/19 1109  Weight: 56.7 kg    Examination:  General exam: Appears calm and comfortable, NAD Respiratory system: Clear to auscultation. Respiratory effort normal. Cardiovascular system: S1 & S2 heard, RRR. No JVD, murmurs, rubs, gallops or clicks. Gastrointestinal system: Abdomen is nondistended, soft and nontender.  Normal bowel sounds heard. Central nervous system: Alert and oriented. No focal neurological deficits. Extremities: No  edema Skin: Warm dry Psychiatry: Judgement and insight appear normal. Mood & affect appropriate.     Data Reviewed: I have personally reviewed following labs and imaging studies  CBC: Recent Labs  Lab 07/28/19 1111  WBC 12.7*  NEUTROABS 10.1*  HGB 17.4*  HCT 51.5*  MCV 84.6  PLT 431*   Basic Metabolic Panel: Recent Labs  Lab 07/28/19 1111 07/28/19 1343 07/29/19 0253 07/29/19 0623  NA 132* 136 131* 137  K 4.7 4.0 4.1 3.4*  CL 102 110 103 106  CO2 11* 11* <7* 10*  GLUCOSE 389* 238* 581* 343*  BUN 16 14 15 15   CREATININE 0.86 0.66 1.05* 0.96  CALCIUM 8.9 7.7* 8.5* 7.8*  MG  --   --  1.8 1.7   GFR: Estimated Creatinine Clearance: 72.3 mL/min (by C-G formula based on SCr of 0.96 mg/dL). Liver Function Tests: Recent Labs  Lab 07/28/19 1111  AST 17  ALT 17  ALKPHOS 141*  BILITOT 1.2  PROT 7.2  ALBUMIN 2.3*   No results for input(s): LIPASE, AMYLASE in the last 168 hours. No results for input(s): AMMONIA in the last 168 hours. Coagulation Profile: No results for input(s): INR, PROTIME in the last 168 hours. Cardiac Enzymes: No results for input(s): CKTOTAL, CKMB, CKMBINDEX, TROPONINI in the last 168 hours. BNP (last 3 results) No results for input(s): PROBNP in the last 8760 hours. HbA1C: Recent Labs    07/28/19 1111  HGBA1C 12.9*   CBG: Recent Labs  Lab 07/29/19 0325 07/29/19 0439 07/29/19 0511 07/29/19 0554 07/29/19 0657  GLUCAP 522* 529* 477* 362* 278*   Lipid Profile: No results for input(s): CHOL, HDL, LDLCALC, TRIG, CHOLHDL, LDLDIRECT in the last 72 hours. Thyroid Function Tests: No results for input(s): TSH, T4TOTAL, FREET4, T3FREE, THYROIDAB in the last 72 hours. Anemia Panel: No results for input(s): VITAMINB12, FOLATE, FERRITIN, TIBC, IRON, RETICCTPCT in the last 72 hours. Sepsis Labs: No results for input(s): PROCALCITON, LATICACIDVEN in the last 168 hours.  Recent Results (from the past 240 hour(s))  Respiratory Panel by RT PCR  (Flu A&B, Covid) - Nasopharyngeal Swab     Status: None   Collection Time: 07/28/19  1:15 PM   Specimen: Nasopharyngeal Swab  Result Value Ref Range Status   SARS Coronavirus 2 by RT PCR NEGATIVE NEGATIVE Final    Comment: (NOTE) SARS-CoV-2 target nucleic acids are NOT DETECTED. The SARS-CoV-2 RNA is generally detectable in upper respiratoy specimens during the acute phase of infection. The lowest concentration of SARS-CoV-2 viral copies this assay can detect is 131 copies/mL. A negative result does not preclude SARS-Cov-2 infection and should not be used as the sole basis for treatment or other patient management decisions. A negative result may occur with  improper specimen collection/handling, submission of specimen other than nasopharyngeal swab, presence of viral mutation(s) within the areas targeted by this assay, and inadequate number of viral copies (<131 copies/mL). A negative result must be combined with clinical observations, patient history, and epidemiological information. The expected result is Negative. Fact Sheet for Patients:  https://www.moore.com/ Fact Sheet for Healthcare Providers:  https://www.young.biz/ This test is not yet ap proved or cleared by the [place] FDA and  has been authorized for detection and/or diagnosis of SARS-CoV-2 by FDA under an Emergency Use Authorization (EUA). This EUA will remain  in effect (meaning this test can be used) for the duration of the COVID-19 declaration under Section 564(b)(1) of the Act, 21 U.S.C. section 360bbb-3(b)(1), unless the authorization is terminated or revoked sooner.    Influenza A by PCR NEGATIVE NEGATIVE Final   Influenza B by PCR NEGATIVE NEGATIVE Final    Comment: (NOTE) The Xpert Xpress SARS-CoV-2/FLU/RSV assay is intended as an aid in  the diagnosis of influenza from Nasopharyngeal swab specimens and  should not be used as a sole basis for treatment. Nasal  washings and  aspirates are unacceptable for Xpert Xpress SARS-CoV-2/FLU/RSV  testing. Fact Sheet for Patients: https://www.moore.com/ Fact Sheet for Healthcare Providers: https://www.young.biz/ This test is not yet approved or cleared by the [place] FDA and  has been authorized for detection and/or diagnosis of SARS-CoV-2 by  FDA under an Emergency Use Authorization (EUA). This EUA will remain  in effect (meaning this test can be used) for the duration of the  Covid-19 declaration under Section 564(b)(1) of the Act, 21  U.S.C. section 360bbb-3(b)(1), unless the authorization is  terminated or revoked. Performed at [facility], [address] [place], [place] [address]   MRSA PCR Screening     Status: None   Collection Time: 07/29/19  4:11 AM   Specimen: Nasal Mucosa; Nasopharyngeal  Result Value Ref Range Status   MRSA by PCR NEGATIVE NEGATIVE Final    Comment:        The GeneXpert MRSA Assay (FDA approved for NASAL specimens only), is one component of a comprehensive MRSA colonization surveillance program. It is not intended to diagnose MRSA infection nor to  guide or monitor treatment for MRSA infections. Performed at [facility], [address], [place], [place] [address]          Radiology Studies: DG Chest 1 View  Result Date: 07/29/2019 CLINICAL DATA:  Shortness of breath EXAM: CHEST  1 VIEW COMPARISON:  Radiograph 01/25/2020 FINDINGS: Lung volumes are low with some streaky basilar opacities favoring atelectasis. No focal consolidative process, convincing features of edema, pneumothorax or effusion. Cardiomediastinal contours are stable from prior counting for differences in technique. No acute osseous or soft tissue abnormality. Telemetry leads overlie the chest. Metallic necklace noted at the base the neck. Nasal cannula partially imaged. IMPRESSION: Low lung volumes with some streaky basilar  opacities favoring atelectasis. Electronically Signed   By: Kreg Shropshire M.D.   On: 07/29/2019 03:31   DG Chest 2 View  Result Date: 07/28/2019 CLINICAL DATA:  Shortness of breath, emesis EXAM: CHEST - 2 VIEW COMPARISON:  05/29/2018 FINDINGS: The heart size and mediastinal contours are within normal limits. Both lungs are clear. The visualized skeletal structures are unremarkable. IMPRESSION: No acute abnormality of the lungs. Electronically Signed   By: Lauralyn Primes M.D.   On: 07/28/2019 11:41        Scheduled Meds: . Chlorhexidine Gluconate Cloth  6 each Topical Daily  . enoxaparin (LOVENOX) injection  40 mg Subcutaneous Q24H  . magnesium oxide  800 mg Oral Daily  . pantoprazole  40 mg Oral Daily  . potassium chloride  40 mEq Oral Once  . tacrolimus  2 mg Oral BID   Continuous Infusions: . sodium chloride 100 mL/hr at 07/29/19 0106  . insulin 5 Units/hr (07/29/19 0558)  . lactated ringers 50 mL/hr (07/29/19 0446)  . sodium bicarbonate 150 mEq in dextrose 5% 1000 mL 150 mEq (07/29/19 0519)    Assessment & Plan:   Active Problems:   DKA (diabetic ketoacidoses) (HCC)   1. DKA in type I diabetes -admit to step down -IV insulin drip per EndoTool protocol -now on d5 There is calling [employer] to get the insulin pump replaced as it is not functioning.  She will need her pump prior to discharge as the retainer ring was missing from the pump.  Likely this was the cause of her going into DKA.. Follow-up with diabetic educator -IV fluids -monitor labs per Endo tool protocol Replace lites  2. History of nephrotic syndrome -patient is off steroids -continue tacrolimus  3. Low sodium suspected pseudo-hyponatremia in the setting of high sugars -Improving with correcting glucose and hydration  4.hypokalemia- will replace Monitor lytes   5.AGMA- 2/2 #1- improving Continue sodium bicarb, hydration, insulin Monitor labs   Family Communication :  None at bedside numbers    Code Status : full DVT prophylaxis : Lovenox Disposition Plan: Likely DC in 1 to 2 days when blood glucose levels are more stable, sodium levels more stable, and patient receives her insulin pump prior to discharge       LOS: 1 day   Time spent: 45 minutes with more than 50% COC    Lynn Ito, MD [facility] Pager [phone]  If 7PM-7AM, please contact night-coverage [email or web address] Password [facility] 07/29/2019, 7:59 AM

## 2019-07-29 NOTE — Progress Notes (Signed)
Pharmacy Electrolyte Monitoring Consult:  Pharmacy consulted to assist in monitoring and replacing electrolytes in this 19 y.o. female admitted on 07/28/2019 with DKA.   Labs:  Sodium (mmol/L)  Date Value  07/29/2019 134 (L)   Potassium (mmol/L)  Date Value  07/29/2019 3.1 (L)   Magnesium (mg/dL)  Date Value  16/04/9603 1.7   Calcium (mg/dL)  Date Value  54/03/8118 7.7 (L)   Albumin (g/dL)  Date Value  14/78/2956 2.3 (L)   Corrected Calcium: 9.1  Assessment/Plan: Patient on insulin infusion and sodium bicarb/D5 at 112mL/hr.   Patient has received potassium PO x 2 doses today.   Patient with schedule BMPs Q4hr.   Will replace to maintain potassium ~ 4 while on continuous insulin infusion.   Pharmacy will continue to monitor and adjust per consult.   Cori Justus L 07/29/2019 4:34 PM

## 2019-07-29 NOTE — Progress Notes (Addendum)
    BRIEF OVERNIGHT PROGRESS REPORT  SUBJECTIVE: Patient continues to have elevated blood glucose level greater than 500 despite use of insulin pump.  IV insulin 5 units administered with no improvement.  Patient received IV bolus to 250.  She continues to complain of nausea and vomiting.  At around 3:20 am patient was noted to be restless, hyperventilating and complaining of shortness of breath and chest pressure. Rapid response initiated.  OBJECTIVE: Patient examined at the bedside.  She is was afebrile with blood pressure 130/77 mm Hg and pulse rate 109 beats/min. There were no focal neurological deficits; he was alert but appeared restless with Kussmaul breathing noted.  ASSESSMENT: 19 year-old female with known history of type I DM on insulin pump, nephrotic syndrome at age 1 of steroid on chronic tacrolimus follows with [facility] nephrology presenting with DKA.  PLAN: 1. Diabetic Ketoacidosis - Patient with hx of Type 1DM on insulin pump. Unclear if there is malfunction with insulin pump as patient continues to have elevated BG >580 despite use of insulin pump.  -Transfer to stepdown -Stop insulin pump and start Insulin drip per DKA protocol (requires NPO) -CBC, BMP+Phos+Mag -ABG obtained to assess severity of acidemia and shows pH 7.10, pCO2 <19, PO2 164, bicarb 3.1 -CXR shows atelectasis -obtainTroponin, EKG for ischemia -Will give NS bolus 1L / hr x 1 then continue with LR until euvolemic (expect extensive volume depletion, could require 5L+) -Glucose: q1h to titrate insulin -Lab monitoring: q2-4h BMP+Phosphorus+pH (ABG/VBG)  -Goal to normalize anion gap  2. Anion gap metabolic acidosis -Likely due to DKA as above -start sodium bicarb gtt as above - Monitor BMP as above  3. Hyponatremia -sodium 131 likely due to above -Correct hyperglycemia as above  4. Acute kidney injury - Likely prerenal in the setting of DKA -Hold nephrotoxins -IV fluids as above -Continue to monitor renal  function   Webb Silversmith, DNP, CCRN, FNP-C [facility] Nurse Practitioner Between 7pm to 7am - Pager [phone]  After 7am go to [email or web address] - password:[email or web address]  [facility]  [employer]  [phone]

## 2019-07-30 LAB — GLUCOSE, CAPILLARY
Glucose-Capillary: 105 mg/dL — ABNORMAL HIGH (ref 70–99)
Glucose-Capillary: 110 mg/dL — ABNORMAL HIGH (ref 70–99)
Glucose-Capillary: 129 mg/dL — ABNORMAL HIGH (ref 70–99)
Glucose-Capillary: 134 mg/dL — ABNORMAL HIGH (ref 70–99)
Glucose-Capillary: 135 mg/dL — ABNORMAL HIGH (ref 70–99)
Glucose-Capillary: 137 mg/dL — ABNORMAL HIGH (ref 70–99)
Glucose-Capillary: 139 mg/dL — ABNORMAL HIGH (ref 70–99)
Glucose-Capillary: 143 mg/dL — ABNORMAL HIGH (ref 70–99)
Glucose-Capillary: 221 mg/dL — ABNORMAL HIGH (ref 70–99)
Glucose-Capillary: 354 mg/dL — ABNORMAL HIGH (ref 70–99)
Glucose-Capillary: 407 mg/dL — ABNORMAL HIGH (ref 70–99)
Glucose-Capillary: 54 mg/dL — ABNORMAL LOW (ref 70–99)
Glucose-Capillary: 65 mg/dL — ABNORMAL LOW (ref 70–99)
Glucose-Capillary: 67 mg/dL — ABNORMAL LOW (ref 70–99)
Glucose-Capillary: 73 mg/dL (ref 70–99)
Glucose-Capillary: 84 mg/dL (ref 70–99)
Glucose-Capillary: 90 mg/dL (ref 70–99)

## 2019-07-30 LAB — BASIC METABOLIC PANEL
Anion gap: 6 (ref 5–15)
Anion gap: 12 (ref 5–15)
BUN: 12 mg/dL (ref 6–20)
BUN: 12 mg/dL (ref 6–20)
CO2: 28 mmol/L (ref 22–32)
CO2: 22 mmol/L (ref 22–32)
Calcium: 7.6 mg/dL — ABNORMAL LOW (ref 8.9–10.3)
Calcium: 7.5 mg/dL — ABNORMAL LOW (ref 8.9–10.3)
Chloride: 106 mmol/L (ref 98–111)
Chloride: 105 mmol/L (ref 98–111)
Creatinine, Ser: 0.58 mg/dL (ref 0.44–1.00)
Creatinine, Ser: 0.55 mg/dL (ref 0.44–1.00)
GFR calc Af Amer: >60 mL/min (ref >60)
GFR calc Af Amer: >60 mL/min (ref >60)
GFR calc non Af Amer: >60 mL/min (ref >60)
GFR calc non Af Amer: >60 mL/min (ref >60)
Glucose, Bld: 118 mg/dL — ABNORMAL HIGH (ref 70–99)
Glucose, Bld: 152 mg/dL — ABNORMAL HIGH (ref 70–99)
Potassium: 3.3 mmol/L — ABNORMAL LOW (ref 3.5–5.1)
Potassium: 3.1 mmol/L — ABNORMAL LOW (ref 3.5–5.1)
Sodium: 140 mmol/L (ref 135–145)
Sodium: 139 mmol/L (ref 135–145)

## 2019-07-30 LAB — MAGNESIUM: Magnesium: 1.7 mg/dL (ref 1.7–2.4)

## 2019-07-30 LAB — PHOSPHORUS: Phosphorus: 1.7 mg/dL — ABNORMAL LOW (ref 2.5–4.6)

## 2019-07-30 MED ORDER — INSULIN ASPART 100 UNIT/ML ~~LOC~~ SOLN
25.0000 [IU] | Freq: Once | SUBCUTANEOUS | Status: AC
  Administered 2019-07-30: 25 [IU] via SUBCUTANEOUS
  Filled 2019-07-30: qty 1

## 2019-07-30 MED ORDER — POTASSIUM CHLORIDE CRYS ER 20 MEQ PO TBCR
40.0000 meq | EXTENDED_RELEASE_TABLET | ORAL | Status: AC
  Administered 2019-07-30 (×2): 40 meq via ORAL
  Filled 2019-07-30 (×2): qty 2

## 2019-07-30 MED ORDER — POTASSIUM PHOSPHATE MONOBASIC 500 MG PO TABS
1000.0000 mg | ORAL_TABLET | Freq: Three times a day (TID) | ORAL | Status: AC
  Administered 2019-07-30 – 2019-07-31 (×2): 1000 mg via ORAL
  Filled 2019-07-30 (×3): qty 2

## 2019-07-30 MED ORDER — POTASSIUM CHLORIDE IN NACL 20-0.9 MEQ/L-% IV SOLN
INTRAVENOUS | Status: DC
  Filled 2019-07-30: qty 1000

## 2019-07-30 MED ORDER — INSULIN GLARGINE 100 UNIT/ML ~~LOC~~ SOLN
15.0000 [IU] | SUBCUTANEOUS | Status: DC
  Administered 2019-07-30: 15 [IU] via SUBCUTANEOUS
  Filled 2019-07-30 (×2): qty 0.15

## 2019-07-30 MED ORDER — INSULIN ASPART 100 UNIT/ML ~~LOC~~ SOLN: 0.0000 [IU] | Freq: Three times a day (TID) | SUBCUTANEOUS | Status: DC

## 2019-07-30 NOTE — Progress Notes (Addendum)
Inpatient Diabetes Program Recommendations  AACE/ADA: New Consensus Statement on Inpatient Glycemic Control (2015)  Target Ranges:  Prepandial:   less than 140 mg/dL      Peak postprandial:   less than 180 mg/dL (1-2 hours)      Critically ill patients:  140 - 180 mg/dL   Lab Results  Component Value Date   GLUCAP 407 (H) 07/30/2019   HGBA1C 12.9 (H) 07/28/2019    Review of Glycemic Control  Results for REILY, TRELOAR (MRN [id number]) as of 07/30/2019 14:33  Ref. Range 07/30/2019 11:18 07/30/2019 13:04  Glucose-Capillary Latest Ref Range: 70 - 99 mg/dL 037 (H) 048 (H)    Diabetes history: DM1 (does not make insulin) Outpatient Diabetes medications: insulin pump  Current orders for Inpatient glycemic control: Has been placed on new insulin pump  Note: Patient received new insulin pump from [employer] which was sent overnight.  Patient programed her new pump with her damaged pump settings and placed site on left abdomen.  She bolused  for lunch with her pump and ate lunch.  At 1330 her CBG was 407 mg/dl.  Received 25 units Novolog SQ with syringe.  MD asked this DM coordinator to come to room and check pump because her blood sugar was 407 mg/dl.   Decided to change site to be on the safe side as her peds endocrinologist told her not to use her belly because she had scar tissue there.  Placed new site on right upper glut.  Explained to Nurse and MD that it will be a few hours if not more until we are sure her pump is infusing correctly because if the 25 units SQ at 1330.  Also explained it is likely her blood sugar will be elevated after supper for couple of hours.   Patient received 15 units of Lantus at 0400.  Still has 12 more hrs of basal onboard.  Called MD and asked to only bolus with pump for dinner and reconnect pump at 4am. MD would like to keep patient on pump; move to floor; and monitor CBG's Q4H.  In agreement that this is a safe plan.  Billey Gosling, RN and patient aware.  Will  continue to follow.  Thank you, Dulce Sellar, RN, BSN Diabetes Coordinator Inpatient Diabetes Program [phone] (team pager from 8a-5p)

## 2019-07-30 NOTE — Progress Notes (Addendum)
[facility]  - [place] at [facility]   PATIENT NAME: Catherine [name]    MR#:  [id number]  DATE OF BIRTH:  [birth year]  SUBJECTIVE:   Patient started back on her insulin pump after she got a new supply shipped and mother dropped it off. She is eating lunch. Labs look stable. No new complaints. REVIEW OF SYSTEMS:   Review of Systems  Constitutional: Negative for chills, fever and weight loss.  HENT: Negative for ear discharge, ear pain and nosebleeds.   Eyes: Negative for blurred vision, pain and discharge.  Respiratory: Negative for sputum production, shortness of breath, wheezing and stridor.   Cardiovascular: Negative for chest pain, palpitations, orthopnea and PND.  Gastrointestinal: Negative for abdominal pain, diarrhea, nausea and vomiting.  Genitourinary: Negative for frequency and urgency.  Musculoskeletal: Negative for back pain and joint pain.  Neurological: Negative for sensory change, speech change, focal weakness and weakness.  Psychiatric/Behavioral: Negative for depression and hallucinations. The patient is not nervous/anxious.    Tolerating Diet:yes Tolerating PT: not needed  DRUG ALLERGIES:  No Known Allergies  VITALS:  Blood pressure 114/89, pulse (!) 103, temperature 98 F (36.7 C), temperature source Oral, resp. rate (!) 22, height 4\' 11"  (1.499 m), weight 56.7 kg, SpO2 100 %.  PHYSICAL EXAMINATION:   Physical Exam  GENERAL:  19 y.o.-year-old patient lying in the bed with no acute distress.  EYES: Pupils equal, round, reactive to light and accommodation. No scleral icterus.   HEENT: Head atraumatic, normocephalic. Oropharynx and nasopharynx clear.  NECK:  Supple, no jugular venous distention. No thyroid enlargement, no tenderness.  LUNGS: Normal breath sounds bilaterally, no wheezing, rales, rhonchi. No use of accessory muscles of respiration.  CARDIOVASCULAR: S1, S2 normal. No murmurs, rubs, or gallops.  ABDOMEN: Soft, nontender,  nondistended. Bowel sounds present. No organomegaly or mass.  EXTREMITIES: No cyanosis, clubbing or edema b/l.    NEUROLOGIC: Cranial nerves II through XII are intact. No focal Motor or sensory deficits b/l.   PSYCHIATRIC:  patient is alert and oriented x 3.  SKIN: No obvious rash, lesion, or ulcer.   LABORATORY PANEL:  CBC Recent Labs  Lab 07/28/19 1111  WBC 12.7*  HGB 17.4*  HCT 51.5*  PLT 440*    Chemistries  Recent Labs  Lab 07/28/19 1111 07/28/19 1343 07/30/19 0411  NA 132*   < > 139  K 4.7   < > 3.1*  CL 102   < > 105  CO2 11*   < > 22  GLUCOSE 389*   < > 152*  BUN 16   < > 12  CREATININE 0.86   < > 0.55  CALCIUM 8.9   < > 7.5*  MG  --    < > 1.7  AST 17  --   --   ALT 17  --   --   ALKPHOS 141*  --   --   BILITOT 1.2  --   --    < > = values in this interval not displayed.   Cardiac Enzymes No results for input(s): TROPONINI in the last 168 hours. RADIOLOGY:  DG Chest 1 View  Result Date: 07/29/2019 CLINICAL DATA:  Shortness of breath EXAM: CHEST  1 VIEW COMPARISON:  Radiograph 01/25/2020 FINDINGS: Lung volumes are low with some streaky basilar opacities favoring atelectasis. No focal consolidative process, convincing features of edema, pneumothorax or effusion. Cardiomediastinal contours are stable from prior counting for differences in technique. No acute osseous or soft tissue abnormality.  Telemetry leads overlie the chest. Metallic necklace noted at the base the neck. Nasal cannula partially imaged. IMPRESSION: Low lung volumes with some streaky basilar opacities favoring atelectasis. Electronically Signed   By: Lovena Le M.D.   On: 07/29/2019 03:31   ASSESSMENT AND PLAN:  JamericalSellarsis a18 y.o.femalewith a known history of type I diabetes on insulin pump, nephrotic syndrome at age 21 now off steroids how were on chronicoral [facility]rolimusfollow[facility] comes to the emergency room with generalized weakness, malaise and sugars in the  350s.  1.DKA in type I diabetes -patient got a new insulin pump delivered by [employer] that has been placed. Her sugar this afternoon was 407. -Give her regular NovoLog 25 units subcu. Patient will adjust her insulin pump and sugar check will be done every four hours. -Follow-up with diabetic educator -d/c IV fluids  2.History of nephrotic syndrome -patient is off steroids -continue tacrolimus  3.Low sodium suspected pseudo-hyponatremia in the setting of high sugars -Improving with correcting glucose and hydratio  4.hypokalemia- will replace Monitor lytes  5.AGMA- 2/2 #1- improving Received IV sodium bicarb, hydration, insulin co2 22   Procedures:none Family communication :pt Consults :Diab coordinator Discharge Disposition :1/27 CODE STATUS: full DVT Prophylaxis :lovenox Barriers to discharge: high sugars anticipate discharge tomorrow once sugars are more stable and that new insulin pump is replaced  TOTAL TIME TAKING CARE OF THIS PATIENT: 25 minutes.  >50% time spent on counselling and coordination of care  Note: This dictation was prepared with Dragon dictation along with smaller phrase technology. Any transcriptional errors that result from this process are unintentional.  Fritzi Mandes M.D    [facility]   CC: Primary care physician; [facility] HealthPatient ID: Ellwood Dense, female   DOB: [birth year], 19 y.o.   MRN: [id number]

## 2019-07-30 NOTE — Progress Notes (Addendum)
Pharmacy Electrolyte Monitoring Consult:  Pharmacy consulted to assist in monitoring and replacing electrolytes in this 19 y.o. female admitted on 07/28/2019 with DKA.   Labs:  Sodium (mmol/L)  Date Value  07/30/2019 139   Potassium (mmol/L)  Date Value  07/30/2019 3.1 (L)   Magnesium (mg/dL)  Date Value  34/14/4360 1.7   Phosphorus (mg/dL)  Date Value  16/58/0063 1.7 (L)   Calcium (mg/dL)  Date Value  49/49/4473 7.5 (L)   Albumin (g/dL)  Date Value  95/84/4171 2.3 (L)    Assessment/Plan: Electrolytes WNL except for phosphorus and potassium. Potassium is low, despite receiving potassium 40 mEq x 2 doses yesterday. Potassium 40 mEq x 2 doses given today. Pt has schedule BMPs Q4H. Phosphorus levels are low today. Consider adding potassium phosphate 2 tabs PO QID, x 3 doses. Sodium bicarbonate 150 mEq IV was D/C'ed today. Pt taking magnesium oxide 800mg  daily. Corrected calcium level is 8.9 (WNL).   Will obtain BMP with am labs.   Will replace to maintain all electrolytes within normal limits.   Pharmacy will continue to monitor and adjust per consult.   Simpson,Michael L 07/30/2019 2:24 PM

## 2019-07-31 LAB — BASIC METABOLIC PANEL
Anion gap: 5 (ref 5–15)
BUN: 9 mg/dL (ref 6–20)
CO2: 28 mmol/L (ref 22–32)
Calcium: 8.1 mg/dL — ABNORMAL LOW (ref 8.9–10.3)
Chloride: 110 mmol/L (ref 98–111)
Creatinine, Ser: 0.51 mg/dL (ref 0.44–1.00)
GFR calc Af Amer: >60 mL/min (ref >60)
GFR calc non Af Amer: >60 mL/min (ref >60)
Glucose, Bld: 70 mg/dL (ref 70–99)
Potassium: 3.8 mmol/L (ref 3.5–5.1)
Sodium: 143 mmol/L (ref 135–145)

## 2019-07-31 LAB — GLUCOSE, CAPILLARY
Glucose-Capillary: 44 mg/dL — CL (ref 70–99)
Glucose-Capillary: 101 mg/dL — ABNORMAL HIGH (ref 70–99)
Glucose-Capillary: 54 mg/dL — ABNORMAL LOW (ref 70–99)
Glucose-Capillary: 77 mg/dL (ref 70–99)
Glucose-Capillary: 63 mg/dL — ABNORMAL LOW (ref 70–99)
Glucose-Capillary: 78 mg/dL (ref 70–99)
Glucose-Capillary: 139 mg/dL — ABNORMAL HIGH (ref 70–99)

## 2019-07-31 MED ORDER — INSULIN PUMP
SUBCUTANEOUS | Status: DC
  Administered 2019-07-31: 6.6 via SUBCUTANEOUS
  Administered 2019-07-31: 5.8 via SUBCUTANEOUS
  Filled 2019-07-31: qty 1

## 2019-07-31 NOTE — Progress Notes (Signed)
BGS -54, gave 8oz apple juice; retook 2315, BGS 90

## 2019-07-31 NOTE — Progress Notes (Signed)
Discharge order received. Patient mental status is at baseline. Vital signs stable . No signs of acute distress. Discharge instructions given. Patient verbalized understanding. No other issues noted at this time. Last CBG check was 139. No other issues noted at this time. Pt is ready to go home.Pt has been discharged to home.

## 2019-07-31 NOTE — Progress Notes (Signed)
BGS 44, gave 8oz orange juice: retook 500 BGS 101

## 2019-07-31 NOTE — Discharge Summary (Signed)
[facility] NAME: Catherine [name]    MR#:  [id number]  DATE OF BIRTH:  2001/02/28  DATE OF ADMISSION:  07/28/2019 ADMITTING PHYSICIAN: Fritzi Mandes, MD  DATE OF DISCHARGE: 07/31/2019  PRIMARY CARE PHYSICIAN: [facility] [place]    ADMISSION DIAGNOSIS:  DKA (diabetic ketoacidoses) ([place]) [E11.10] Diabetic ketoacidosis without coma associated with type 1 diabetes mellitus ([place]) [E10.10]  DISCHARGE DIAGNOSIS:  DKA in type 1 Diabetes  SECONDARY DIAGNOSIS:   Past Medical History:  Diagnosis Date  . Diabetes mellitus without complication ([place])   . Nephritic syndrome     HOSPITAL COURSE:   JamericalSellarsis a18 y.o.femalewith a known history of type I diabetes on insulin pump, nephrotic syndrome at age 46 now off steroids how were on chronic[facility] comes to the emergency room with generalized weakness, malaise and sugars in the 350s.  1.DKA in type I diabetes--DKA with Anion gap metabolic acidosis resolved -patient got a new insulin pump delivered by [employer] that has been placed. Her sugars last night and early am were low--pt asymptomatic -sugars much better in the 70's -eating well and using her Insulin pump per protocol -Follow-up with diabetic educator -pt will f/u Dr Gabriel Carina -endocrinology in feb--she has appt  2.History of nephrotic syndrome -patient is off steroids -continue tacrolimus  3.Low sodium suspected pseudo-hyponatremia in the setting of high sugars -Improving with correcting glucose and hydration  4.hypokalemia- repleted K 3.8  Procedures:none Family communication :pateint. She has informed her mom Consults :Diab coordinator Discharge Disposition :home  CODE STATUS: full DVT Prophylaxis :lovenox Barriers to discharge: Home today  Pt is agreeable with plan. CONSULTS OBTAINED:    DRUG ALLERGIES:  No Known Allergies  DISCHARGE  MEDICATIONS:   Allergies as of 07/31/2019   No Known Allergies     Medication List    STOP taking these medications   furosemide 20 MG tablet Commonly known as: LASIX     TAKE these medications   Glucagon Emergency 1 MG Kit Inject 1 mg into the muscle once.   NovoLOG 100 UNIT/ML injection Generic drug: insulin aspart Inject 0-100 Units into the skin daily. Patient has insulin pump   tacrolimus 1 MG capsule Commonly known as: PROGRAF Take 2 mg by mouth 2 (two) times daily.       If you experience worsening of your admission symptoms, develop shortness of breath, life threatening emergency, suicidal or homicidal thoughts you must seek medical attention immediately by calling 911 or calling your MD immediately  if symptoms less severe.  You Must read complete instructions/literature along with all the possible adverse reactions/side effects for all the Medicines you take and that have been prescribed to you. Take any new Medicines after you have completely understood and accept all the possible adverse reactions/side effects.   Please note  You were cared for by a hospitalist during your hospital stay. If you have any questions about your discharge medications or the care you received while you were in the hospital after you are discharged, you can call the unit and asked to speak with the hospitalist on call if the hospitalist that took care of you is not available. Once you are discharged, your primary care physician will handle any further medical issues. Please note that NO REFILLS for any discharge medications will be authorized once you are discharged, as it is imperative that you return to your primary care physician (or establish a relationship with a primary care  physician if you do not have one) for your aftercare needs so that they can reassess your need for medications and monitor your lab values. Today   SUBJECTIVE   Some low sugars earlier--pt asymptomatic   VITAL  SIGNS:  Blood pressure 116/79, pulse 91, temperature (!) 97.5 F (36.4 C), temperature source Oral, resp. rate 16, height 4' 11"  (1.499 m), weight 56.7 kg, SpO2 100 %.  I/O:    Intake/Output Summary (Last 24 hours) at 07/31/2019 1341 Last data filed at 07/30/2019 1600 Gross per 24 hour  Intake 0.79 ml  Output --  Net 0.79 ml    PHYSICAL EXAMINATION:  GENERAL:  19 y.o.-year-old patient lying in the bed with no acute distress.  EYES: Pupils equal, round, reactive to light and accommodation. No scleral icterus.  HEENT: Head atraumatic, normocephalic. Oropharynx and nasopharynx clear.  NECK:  Supple, no jugular venous distention. No thyroid enlargement, no tenderness.  LUNGS: Normal breath sounds bilaterally, no wheezing, rales,rhonchi or crepitation. No use of accessory muscles of respiration.  CARDIOVASCULAR: S1, S2 normal. No murmurs, rubs, or gallops.  ABDOMEN: Soft, non-tender, non-distended. Bowel sounds present. No organomegaly or mass.  EXTREMITIES: No pedal edema, cyanosis, or clubbing.  NEUROLOGIC: Cranial nerves II through XII are intact. Muscle strength 5/5 in all extremities. Sensation intact. Gait not checked.  PSYCHIATRIC: The patient is alert and oriented x 3.  SKIN: No obvious rash, lesion, or ulcer.   DATA REVIEW:   CBC  Recent Labs  Lab 07/28/19 1111  WBC 12.7*  HGB 17.4*  HCT 51.5*  PLT 440*    Chemistries  Recent Labs  Lab 07/28/19 1111 07/28/19 1343 07/30/19 0411 07/30/19 0411 07/31/19 0734  NA 132*   < > 139   < > 143  K 4.7   < > 3.1*   < > 3.8  CL 102   < > 105   < > 110  CO2 11*   < > 22   < > 28  GLUCOSE 389*   < > 152*   < > 70  BUN 16   < > 12   < > 9  CREATININE 0.86   < > 0.55   < > 0.51  CALCIUM 8.9   < > 7.5*   < > 8.1*  MG  --    < > 1.7  --   --   AST 17  --   --   --   --   ALT 17  --   --   --   --   ALKPHOS 141*  --   --   --   --   BILITOT 1.2  --   --   --   --    < > = values in this interval not displayed.     Microbiology Results   Recent Results (from the past 240 hour(s))  Respiratory Panel by RT PCR (Flu A&B, Covid) - Nasopharyngeal Swab     Status: None   Collection Time: 07/28/19  1:15 PM   Specimen: Nasopharyngeal Swab  Result Value Ref Range Status   SARS Coronavirus 2 by RT PCR NEGATIVE NEGATIVE Final    Comment: (NOTE) SARS-CoV-2 target nucleic acids are NOT DETECTED. The SARS-CoV-2 RNA is generally detectable in upper respiratoy specimens during the acute phase of infection. The lowest concentration of SARS-CoV-2 viral copies this assay can detect is 131 copies/mL. A negative result does not preclude SARS-Cov-2 infection and should not be used as the  sole basis for treatment or other patient management decisions. A negative result may occur with  improper specimen collection/handling, submission of specimen other than nasopharyngeal swab, presence of viral mutation(s) within the areas targeted by this assay, and inadequate number of viral copies (<131 copies/mL). A negative result must be combined with clinical observations, patient history, and epidemiological information. The expected result is Negative. Fact Sheet for Patients:  PinkCheek.be Fact Sheet for Healthcare Providers:  GravelBags.it This test is not yet ap proved or cleared by the [place] FDA and  has been authorized for detection and/or diagnosis of SARS-CoV-2 by FDA under an Emergency Use Authorization (EUA). This EUA will remain  in effect (meaning this test can be used) for the duration of the COVID-19 declaration under Section 564(b)(1) of the Act, 21 U.S.C. section 360bbb-3(b)(1), unless the authorization is terminated or revoked sooner.    Influenza A by PCR NEGATIVE NEGATIVE Final   Influenza B by PCR NEGATIVE NEGATIVE Final    Comment: (NOTE) The Xpert Xpress SARS-CoV-2/FLU/RSV assay is intended as an aid in  the diagnosis of  influenza from Nasopharyngeal swab specimens and  should not be used as a sole basis for treatment. Nasal washings and  aspirates are unacceptable for Xpert Xpress SARS-CoV-2/FLU/RSV  testing. Fact Sheet for Patients: PinkCheek.be Fact Sheet for Healthcare Providers: GravelBags.it This test is not yet approved or cleared by the [place] FDA and  has been authorized for detection and/or diagnosis of SARS-CoV-2 by  FDA under an Emergency Use Authorization (EUA). This EUA will remain  in effect (meaning this test can be used) for the duration of the  Covid-19 declaration under Section 564(b)(1) of the Act, 21  U.S.C. section 360bbb-3(b)(1), unless the authorization is  terminated or revoked. Performed at [facility], [address] [place], [place] [address]   MRSA PCR Screening     Status: None   Collection Time: 07/29/19  4:11 AM   Specimen: Nasal Mucosa; Nasopharyngeal  Result Value Ref Range Status   MRSA by PCR NEGATIVE NEGATIVE Final    Comment:        The GeneXpert MRSA Assay (FDA approved for NASAL specimens only), is one component of a comprehensive MRSA colonization surveillance program. It is not intended to diagnose MRSA infection nor to guide or monitor treatment for MRSA infections. Performed at [facility], [address] [place], [place] [address]     RADIOLOGY:  No results found.   CODE STATUS:     Code Status Orders  (From admission, onward)         Start     Ordered   07/28/19 1840  Full code  Continuous     07/28/19 1839        Code Status History    This patient has a current code status but no historical code status.   Advance Care Planning Activity       TOTAL TIME TAKING CARE OF THIS PATIENT: **35 minutes.    Fritzi Mandes M.D  [facility]    CC: Primary care physician; [facility]

## 2019-07-31 NOTE — Discharge Instructions (Signed)
Keep log of your sugars at home Manage your insulin pump as before and call your Endocrinologist or PCP  if you have any issues

## 2019-07-31 NOTE — Progress Notes (Signed)
Inpatient Diabetes Program Recommendations  AACE/ADA: New Consensus Statement on Inpatient Glycemic Control   Target Ranges:  Prepandial:   less than 140 mg/dL      Peak postprandial:   less than 180 mg/dL (1-2 hours)      Critically ill patients:  140 - 180 mg/dL   Results for Catherine [name], Catherine [name] (MRN [id number]) as of 07/31/2019 08:20  Ref. Range 07/30/2019 07:13 07/30/2019 11:18 07/30/2019 13:04 07/30/2019 15:14 07/30/2019 16:00 07/30/2019 16:56 07/30/2019 19:47 07/30/2019 20:18 07/30/2019 22:50 07/30/2019 23:15 07/31/2019 04:25 07/31/2019 05:02 07/31/2019 07:46 07/31/2019 08:09  Glucose-Capillary Latest Ref Range: 70 - 99 mg/dL 818 (H) 563 (H) 149 (H) 84 67 (L) 134 (H) 65 (L) 73 54 (L) 90 44 (LL) 101 (H) 54 (L) 77   Review of Glycemic Control  Diabetes history: DM1 (makes NO insulin; requires basal, correction, and carb coverage insulin) Outpatient Diabetes medications: [employer] insulin pump Current orders for Inpatient glycemic control:Lantus 15 units Q24H, Novolog 0-9 units TID with meals  Inpatient Diabetes Program Recommendations:    Insulin- Please discontinue Lantus and Novolog and order Insulin Pump order set since patient is using her insulin pump for DM control at this time.  NOTE: Patient experienced hypoglycemia several times over the past 16 hours. Patient received Lantus 15 units at 4:30 am on 07/30/19 and was advised to reconnect her insulin pump yesterday afternoon. Anticipate hypoglycemia due to Lantus on board as well as receiving hourly basal insulin via insulin pump.  Spoke with patient over the phone and she reports that she has had her insulin pump on since yesterday when she reconnected it. Patient is alert and oriented and states she feels okay this morning.  Patient reports that her insulin pump settings are:   Basal 12A 1.60 units/hour Total 24 hour basal: 38.4 units  Insulin Sensitivity  12A 1:25 (1 unit drops glucose 25 mg/dl)  Insulin Carb Coverage 12A  1:6 grams (1  unit covers 6 grams of carbs) 11A 1:5 grams (1 unit coverage 5 grams of carbs)  Discussed hypoglycemia noted over the past 16 hours. Explained that hypoglycemia likely due to her getting Lantus yesterday morning plus getting hourly basal insulin from her insulin pump since she reconnected.  Encouraged patient to eat breakfast before she boluses for carbohydrates she eats since current glucose is 77 mg/dl. Patient states that she has a Dexcom CGM but she does not currently have it on but plans to restart it once she gets home.  Patient verbalized understanding of information discussed and has no questions or concerns at this time. Sent chat message to Corey Skains, RN and Dr. Allena Katz.  Thanks, Orlando Penner, RN, MSN, CDE Diabetes Coordinator Inpatient Diabetes Program [phone] (Team Pager from 8am to 5pm)

## 2019-07-31 NOTE — Progress Notes (Signed)
Pt.carb intake was 40 grams after breakfast.. Pt bolus herself with 6.6 units via insulin pump with RN supervision. Pt. was educated since the bolus is based on carb intake, to make sure that she is calculating her carbs intake accurately. No other issues at this time.

## 2019-08-16 ENCOUNTER — Emergency Department
Admission: EM | Admit: 2019-08-16 | Discharge: 2019-08-16 | Disposition: A | Discharge status: Home / Self Care | Payer: PPO | Attending: Emergency Medicine | Admitting: Emergency Medicine

## 2019-08-16 ENCOUNTER — Other Ambulatory Visit: Payer: Self-pay

## 2019-08-16 ENCOUNTER — Encounter: Payer: Self-pay | Admitting: Emergency Medicine

## 2019-08-16 DIAGNOSIS — R220 Localized swelling, mass and lump, head: Secondary | ICD-10-CM | POA: Diagnosis not present

## 2019-08-16 DIAGNOSIS — Z79899 Other long term (current) drug therapy: Secondary | ICD-10-CM | POA: Insufficient documentation

## 2019-08-16 DIAGNOSIS — R112 Nausea with vomiting, unspecified: Secondary | ICD-10-CM | POA: Diagnosis present

## 2019-08-16 DIAGNOSIS — E119 Type 2 diabetes mellitus without complications: Secondary | ICD-10-CM | POA: Diagnosis not present

## 2019-08-16 DIAGNOSIS — N049 Nephrotic syndrome with unspecified morphologic changes: Secondary | ICD-10-CM | POA: Insufficient documentation

## 2019-08-16 LAB — CBC
HCT: 43.2 % (ref 36.0–46.0)
Hemoglobin: 14.5 g/dL (ref 12.0–15.0)
MCH: 28.8 pg (ref 26.0–34.0)
MCHC: 33.6 g/dL (ref 30.0–36.0)
MCV: 85.9 fL (ref 80.0–100.0)
Platelets: 241 10*3/uL (ref 150–400)
RBC: 5.03 MIL/uL (ref 3.87–5.11)
RDW: 13.6 % (ref 11.5–15.5)
WBC: 4.2 10*3/uL (ref 4.0–10.5)
nRBC: 0 % (ref 0.0–0.2)

## 2019-08-16 LAB — COMPREHENSIVE METABOLIC PANEL
ALT: 11 U/L (ref 0–44)
AST: 17 U/L (ref 15–41)
Albumin: <1 g/dL — ABNORMAL LOW (ref 3.5–5.0)
Alkaline Phosphatase: 103 U/L (ref 38–126)
Anion gap: 7 (ref 5–15)
BUN: 20 mg/dL (ref 6–20)
CO2: 22 mmol/L (ref 22–32)
Calcium: 7.4 mg/dL — ABNORMAL LOW (ref 8.9–10.3)
Chloride: 103 mmol/L (ref 98–111)
Creatinine, Ser: 0.85 mg/dL (ref 0.44–1.00)
GFR calc Af Amer: >60 mL/min (ref >60)
GFR calc non Af Amer: >60 mL/min (ref >60)
Glucose, Bld: 375 mg/dL — ABNORMAL HIGH (ref 70–99)
Potassium: 4.2 mmol/L (ref 3.5–5.1)
Sodium: 132 mmol/L — ABNORMAL LOW (ref 135–145)
Total Bilirubin: 0.5 mg/dL (ref 0.3–1.2)
Total Protein: 5 g/dL — ABNORMAL LOW (ref 6.5–8.1)

## 2019-08-16 LAB — LIPASE, BLOOD: Lipase: 28 U/L (ref 11–51)

## 2019-08-16 MED ORDER — PREDNISONE 10 MG PO TABS: ORAL_TABLET | ORAL | 0 refills | Status: DC

## 2019-08-16 MED ORDER — FUROSEMIDE 20 MG PO TABS: 20.0000 mg | ORAL_TABLET | Freq: Every day | ORAL | 0 refills | Status: DC | PRN

## 2019-08-16 NOTE — ED Notes (Signed)
Respiratory aware of VBG in lab

## 2019-08-16 NOTE — ED Triage Notes (Signed)
Pt to ED via POV stating that she has hx/o nephrotic syndrome and she believes that she is having a relapse. Pt states that she has been vomiting and swelling in her face and knees. Pt states that her symptoms started 2 days ago. Pt is in NAD at this time.

## 2019-08-16 NOTE — ED Provider Notes (Signed)
[facility] Emergency Department Provider Note   ____________________________________________    I have reviewed the triage vital signs and the nursing notes.   HISTORY  Chief Complaint Emesis and Edema     HPI Catherine [name] is a 19 y.o. female with history of diabetes and nephrotic syndrome who presents with complaints of nausea vomiting and facial swelling.  Patient reports this is consistent with a flareup of her nephrotic syndrome.  Typically she requires a prednisone taper for this.  She denies abdominal pain.  No diarrhea.  No fevers or chills.  Reports she has been monitoring her glucose carefully.  Review of medical records demonstrates an admission for DKA recently.  Past Medical History:  Diagnosis Date  . Diabetes mellitus without complication ([place])   . Nephritic syndrome     Patient Active Problem List   Diagnosis Date Noted  . DKA (diabetic ketoacidoses) ([place]) 07/28/2019  . Nephrotic syndrome due to type 1 diabetes mellitus ([place])   . Hyponatremia   . Acidosis     History reviewed. No pertinent surgical history.  Prior to Admission medications   Medication Sig Start Date End Date Taking? Authorizing Provider  furosemide (LASIX) 20 MG tablet Take 1 tablet (20 mg total) by mouth daily as needed for edema. 08/16/19 08/15/20  Lavonia Drafts, MD  Glucagon, rDNA, (GLUCAGON EMERGENCY) 1 MG KIT Inject 1 mg into the muscle once. 02/27/19   [provider]  NOVOLOG 100 UNIT/ML injection Inject 0-100 Units into the skin daily. Patient has insulin pump 01/08/17   [provider]  predniSONE (DELTASONE) 10 MG tablet Take 5 tablets daily until urine negative x 3 days, then taper gradually 08/16/19   Lavonia Drafts, MD  tacrolimus (PROGRAF) 1 MG capsule Take 2 mg by mouth 2 (two) times daily. 12/30/16   [provider]     Allergies Patient has no known allergies.  No family history on file.  Social History Social  History   Tobacco Use  . Smoking status: Never Smoker  . Smokeless tobacco: Never Used  Substance Use Topics  . Alcohol use: Not Currently  . Drug use: Not Currently    Review of Systems  Constitutional: No fever/chills Eyes: No visual changes.  ENT: No sore throat. Cardiovascular: Denies chest pain. Respiratory: Denies shortness of breath. Gastrointestinal: As above Genitourinary: Negative for dysuria. Musculoskeletal: As above Skin: Negative for rash. Neurological: Negative for headaches    ____________________________________________   PHYSICAL EXAM:  VITAL SIGNS: ED Triage Vitals  Enc Vitals Group     BP 08/16/19 0830 102/67     Pulse Rate 08/16/19 0830 94     Resp 08/16/19 0830 18     Temp 08/16/19 0830 97.9 F (36.6 C)     Temp Source 08/16/19 0830 Oral     SpO2 08/16/19 0830 99 %     Weight 08/16/19 0830 54.4 kg (120 lb)     Height 08/16/19 0830 1.499 m (_0 )     Head Circumference --      Peak Flow --      Pain Score 08/16/19 0845 0     Pain Loc --      Pain Edu? --      Excl. in [place]? --     Constitutional: Alert and oriented.  Pleasant and interactive Eyes: Conjunctivae are normal.  Edema noted to the eyelids, mild Head: Atraumatic. Nose: No congestion/rhinnorhea. Mouth/Throat: Mucous membranes are moist.    Cardiovascular: Normal  rate, regular rhythm. Grossly normal heart sounds.  Good peripheral circulation. Respiratory: Normal respiratory effort.  No retractions. Lungs CTAB. Gastrointestinal: Soft and nontender. No distention.  No CVA tenderness. Genitourinary: deferred Musculoskeletal: No lower extremity tenderness nor edema.  Warm and well perfused Neurologic:  Normal speech and language. No gross focal neurologic deficits are appreciated.  Skin:  Skin is warm, dry and intact. No rash noted. Psychiatric: Mood and affect are normal. Speech and behavior are normal.  ____________________________________________   LABS (all labs ordered  are listed, but only abnormal results are displayed)  Labs Reviewed  COMPREHENSIVE METABOLIC PANEL - Abnormal; Notable for the following components:      Result Value   Sodium 132 (*)    Glucose, Bld 375 (*)    Calcium 7.4 (*)    Total Protein 5.0 (*)    Albumin <1.0 (*)    All other components within normal limits  BLOOD GAS, VENOUS - Abnormal; Notable for the following components:   pCO2, Ven 40 (*)    All other components within normal limits  CBC  LIPASE, BLOOD   ____________________________________________  EKG  None ____________________________________________  RADIOLOGY  None ____________________________________________   PROCEDURES  Procedure(s) performed: No  Procedures   Critical Care performed: No ____________________________________________   INITIAL IMPRESSION / ASSESSMENT AND PLAN / ED COURSE  Pertinent labs & imaging results that were available during my care of the patient were reviewed by me and considered in my medical decision making (see chart for details).  Patient well-appearing and in no acute distress, mild edema to the face noted some nausea and vomiting, suspicious for flareup of nephrotic syndrome, will check labs VBG to rule out DKA given her history.  Lab work certainly consistent with nephrotic syndrome, no evidence of DKA.  Will treat with prednisone taper, Lasix, close follow-up with her nephrologist.  She is quite comfortable with this plan as she has been through this numerous times.  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    ____________________________________________   FINAL CLINICAL IMPRESSION(S) / ED DIAGNOSES  Final diagnoses:  Nephrotic syndrome        Note:  This document was prepared using Dragon voice recognition software and may include unintentional dictation errors.   Lavonia Drafts, MD 08/16/19 1038

## 2019-08-17 LAB — BLOOD GAS, VENOUS
Acid-base deficit: 2 mmol/L (ref 0.0–2.0)
Bicarbonate: 23.1 mmol/L (ref 20.0–28.0)
O2 Saturation: 48 %
Patient temperature: 37
pCO2, Ven: 40 mmHg — ABNORMAL LOW (ref 44.0–60.0)
pH, Ven: 7.37 (ref 7.250–7.430)

## 2019-08-21 ENCOUNTER — Other Ambulatory Visit: Payer: Self-pay

## 2019-08-21 ENCOUNTER — Encounter: Payer: Self-pay | Admitting: Emergency Medicine

## 2019-08-21 ENCOUNTER — Inpatient Hospital Stay
Admission: EM | Admit: 2019-08-21 | Discharge: 2019-08-24 | DRG: 637 | Disposition: A | Discharge status: Home / Self Care | Payer: PPO | Attending: Internal Medicine | Admitting: Internal Medicine

## 2019-08-21 DIAGNOSIS — E1110 Type 2 diabetes mellitus with ketoacidosis without coma: Secondary | ICD-10-CM | POA: Diagnosis present

## 2019-08-21 DIAGNOSIS — U071 COVID-19: Secondary | ICD-10-CM | POA: Diagnosis present

## 2019-08-21 DIAGNOSIS — Z9641 Presence of insulin pump (external) (internal): Secondary | ICD-10-CM | POA: Diagnosis present

## 2019-08-21 DIAGNOSIS — N049 Nephrotic syndrome with unspecified morphologic changes: Secondary | ICD-10-CM | POA: Diagnosis present

## 2019-08-21 DIAGNOSIS — Z79899 Other long term (current) drug therapy: Secondary | ICD-10-CM | POA: Diagnosis not present

## 2019-08-21 DIAGNOSIS — E1010 Type 1 diabetes mellitus with ketoacidosis without coma: Principal | ICD-10-CM | POA: Diagnosis present

## 2019-08-21 DIAGNOSIS — N179 Acute kidney failure, unspecified: Secondary | ICD-10-CM | POA: Diagnosis present

## 2019-08-21 DIAGNOSIS — E860 Dehydration: Secondary | ICD-10-CM | POA: Diagnosis present

## 2019-08-21 DIAGNOSIS — N040 Nephrotic syndrome with minor glomerular abnormality: Secondary | ICD-10-CM | POA: Diagnosis present

## 2019-08-21 DIAGNOSIS — E1029 Type 1 diabetes mellitus with other diabetic kidney complication: Secondary | ICD-10-CM

## 2019-08-21 DIAGNOSIS — N050 Unspecified nephritic syndrome with minor glomerular abnormality: Secondary | ICD-10-CM

## 2019-08-21 DIAGNOSIS — R809 Proteinuria, unspecified: Secondary | ICD-10-CM

## 2019-08-21 DIAGNOSIS — IMO0002 Reserved for concepts with insufficient information to code with codable children: Secondary | ICD-10-CM

## 2019-08-21 DIAGNOSIS — E1021 Type 1 diabetes mellitus with diabetic nephropathy: Secondary | ICD-10-CM | POA: Diagnosis present

## 2019-08-21 DIAGNOSIS — Z794 Long term (current) use of insulin: Secondary | ICD-10-CM | POA: Diagnosis not present

## 2019-08-21 LAB — URINALYSIS, COMPLETE (UACMP) WITH MICROSCOPIC
Bacteria, UA: NONE SEEN
Bilirubin Urine: NEGATIVE
Glucose, UA: >=500 mg/dL — AB
Ketones, ur: 20 mg/dL — AB
Leukocytes,Ua: NEGATIVE
Nitrite: NEGATIVE
Protein, ur: >=300 mg/dL — AB
Specific Gravity, Urine: 1.025 (ref 1.005–1.030)
pH: 5 (ref 5.0–8.0)

## 2019-08-21 LAB — CBC
HCT: 48.3 % — ABNORMAL HIGH (ref 36.0–46.0)
HCT: 47 % — ABNORMAL HIGH (ref 36.0–46.0)
Hemoglobin: 16.4 g/dL — ABNORMAL HIGH (ref 12.0–15.0)
Hemoglobin: 15.8 g/dL — ABNORMAL HIGH (ref 12.0–15.0)
MCH: 29 pg (ref 26.0–34.0)
MCH: 28.7 pg (ref 26.0–34.0)
MCHC: 34 g/dL (ref 30.0–36.0)
MCHC: 33.6 g/dL (ref 30.0–36.0)
MCV: 85.3 fL (ref 80.0–100.0)
MCV: 85.3 fL (ref 80.0–100.0)
Platelets: 321 10*3/uL (ref 150–400)
Platelets: 364 10*3/uL (ref 150–400)
RBC: 5.66 MIL/uL — ABNORMAL HIGH (ref 3.87–5.11)
RBC: 5.51 MIL/uL — ABNORMAL HIGH (ref 3.87–5.11)
RDW: 13.2 % (ref 11.5–15.5)
RDW: 13.2 % (ref 11.5–15.5)
WBC: 10.3 10*3/uL (ref 4.0–10.5)
WBC: 12.3 10*3/uL — ABNORMAL HIGH (ref 4.0–10.5)
nRBC: 0 % (ref 0.0–0.2)
nRBC: 0 % (ref 0.0–0.2)

## 2019-08-21 LAB — GLUCOSE, CAPILLARY
Glucose-Capillary: 135 mg/dL — ABNORMAL HIGH (ref 70–99)
Glucose-Capillary: 162 mg/dL — ABNORMAL HIGH (ref 70–99)
Glucose-Capillary: 169 mg/dL — ABNORMAL HIGH (ref 70–99)
Glucose-Capillary: 170 mg/dL — ABNORMAL HIGH (ref 70–99)
Glucose-Capillary: 260 mg/dL — ABNORMAL HIGH (ref 70–99)
Glucose-Capillary: 362 mg/dL — ABNORMAL HIGH (ref 70–99)
Glucose-Capillary: 391 mg/dL — ABNORMAL HIGH (ref 70–99)
Glucose-Capillary: 399 mg/dL — ABNORMAL HIGH (ref 70–99)
Glucose-Capillary: 415 mg/dL — ABNORMAL HIGH (ref 70–99)

## 2019-08-21 LAB — COMPREHENSIVE METABOLIC PANEL
ALT: 11 U/L (ref 0–44)
AST: 23 U/L (ref 15–41)
Albumin: <1 g/dL — ABNORMAL LOW (ref 3.5–5.0)
Alkaline Phosphatase: 96 U/L (ref 38–126)
Anion gap: 17 — ABNORMAL HIGH (ref 5–15)
BUN: 45 mg/dL — ABNORMAL HIGH (ref 6–20)
CO2: 13 mmol/L — ABNORMAL LOW (ref 22–32)
Calcium: 7.4 mg/dL — ABNORMAL LOW (ref 8.9–10.3)
Chloride: 96 mmol/L — ABNORMAL LOW (ref 98–111)
Creatinine, Ser: 1.26 mg/dL — ABNORMAL HIGH (ref 0.44–1.00)
GFR calc Af Amer: >60 mL/min (ref >60)
GFR calc non Af Amer: >60 mL/min (ref >60)
Glucose, Bld: 471 mg/dL — ABNORMAL HIGH (ref 70–99)
Potassium: 5.2 mmol/L — ABNORMAL HIGH (ref 3.5–5.1)
Sodium: 126 mmol/L — ABNORMAL LOW (ref 135–145)
Total Bilirubin: 1.3 mg/dL — ABNORMAL HIGH (ref 0.3–1.2)
Total Protein: 5.1 g/dL — ABNORMAL LOW (ref 6.5–8.1)

## 2019-08-21 LAB — BLOOD GAS, VENOUS
Acid-base deficit: 13.1 mmol/L — ABNORMAL HIGH (ref 0.0–2.0)
Bicarbonate: 13.5 mmol/L — ABNORMAL LOW (ref 20.0–28.0)
O2 Saturation: 64.2 %
Patient temperature: 37
pCO2, Ven: 33 mmHg — ABNORMAL LOW (ref 44.0–60.0)
pH, Ven: 7.22 — ABNORMAL LOW (ref 7.250–7.430)
pO2, Ven: 41 mmHg (ref 32.0–45.0)

## 2019-08-21 LAB — POCT PREGNANCY, URINE: Preg Test, Ur: NEGATIVE

## 2019-08-21 LAB — BASIC METABOLIC PANEL
Anion gap: 15 (ref 5–15)
Anion gap: 5 (ref 5–15)
BUN: 43 mg/dL — ABNORMAL HIGH (ref 6–20)
BUN: 34 mg/dL — ABNORMAL HIGH (ref 6–20)
CO2: 16 mmol/L — ABNORMAL LOW (ref 22–32)
CO2: 23 mmol/L (ref 22–32)
Calcium: 7.3 mg/dL — ABNORMAL LOW (ref 8.9–10.3)
Calcium: 6.9 mg/dL — ABNORMAL LOW (ref 8.9–10.3)
Chloride: 98 mmol/L (ref 98–111)
Chloride: 103 mmol/L (ref 98–111)
Creatinine, Ser: 1.24 mg/dL — ABNORMAL HIGH (ref 0.44–1.00)
Creatinine, Ser: 0.94 mg/dL (ref 0.44–1.00)
GFR calc Af Amer: >60 mL/min (ref >60)
GFR calc Af Amer: >60 mL/min (ref >60)
GFR calc non Af Amer: >60 mL/min (ref >60)
GFR calc non Af Amer: >60 mL/min (ref >60)
Glucose, Bld: 394 mg/dL — ABNORMAL HIGH (ref 70–99)
Glucose, Bld: 156 mg/dL — ABNORMAL HIGH (ref 70–99)
Potassium: 4.1 mmol/L (ref 3.5–5.1)
Potassium: 3.4 mmol/L — ABNORMAL LOW (ref 3.5–5.1)
Sodium: 129 mmol/L — ABNORMAL LOW (ref 135–145)
Sodium: 131 mmol/L — ABNORMAL LOW (ref 135–145)

## 2019-08-21 LAB — LIPASE, BLOOD: Lipase: 29 U/L (ref 11–51)

## 2019-08-21 LAB — RESPIRATORY PANEL BY RT PCR (FLU A&B, COVID)
Influenza A by PCR: NEGATIVE
Influenza B by PCR: NEGATIVE
SARS Coronavirus 2 by RT PCR: POSITIVE — AB

## 2019-08-21 LAB — BETA-HYDROXYBUTYRIC ACID
Beta-Hydroxybutyric Acid: 6.89 mmol/L — ABNORMAL HIGH (ref 0.05–0.27)
Beta-Hydroxybutyric Acid: 5.99 mmol/L — ABNORMAL HIGH (ref 0.05–0.27)

## 2019-08-21 LAB — HIV ANTIBODY (ROUTINE TESTING W REFLEX): HIV Screen 4th Generation wRfx: NONREACTIVE

## 2019-08-21 MED ORDER — INSULIN ASPART 100 UNIT/ML ~~LOC~~ SOLN
3.0000 [IU] | Freq: Three times a day (TID) | SUBCUTANEOUS | Status: DC
  Administered 2019-08-22 – 2019-08-24 (×5): 3 [IU] via SUBCUTANEOUS
  Filled 2019-08-21 (×5): qty 1

## 2019-08-21 MED ORDER — ONDANSETRON HCL 4 MG/2ML IJ SOLN
4.0000 mg | Freq: Once | INTRAMUSCULAR | Status: AC
  Administered 2019-08-21: 13:00:00 4 mg via INTRAVENOUS
  Filled 2019-08-21: qty 2

## 2019-08-21 MED ORDER — INSULIN REGULAR(HUMAN) IN NACL 100-0.9 UT/100ML-% IV SOLN
INTRAVENOUS | Status: DC
  Administered 2019-08-21: 15:00:00 6.5 [IU]/h via INTRAVENOUS
  Filled 2019-08-21: qty 100

## 2019-08-21 MED ORDER — SODIUM CHLORIDE 0.9 % IV BOLUS
1000.0000 mL | Freq: Once | INTRAVENOUS | Status: AC
  Administered 2019-08-21: 13:00:00 1000 mL via INTRAVENOUS

## 2019-08-21 MED ORDER — DEXTROSE-NACL 5-0.45 % IV SOLN: INTRAVENOUS | Status: DC

## 2019-08-21 MED ORDER — SODIUM CHLORIDE 0.9 % IV SOLN: INTRAVENOUS | Status: DC

## 2019-08-21 MED ORDER — MORPHINE SULFATE (PF) 2 MG/ML IV SOLN
2.0000 mg | Freq: Once | INTRAVENOUS | Status: AC
  Administered 2019-08-21: 13:00:00 2 mg via INTRAVENOUS
  Filled 2019-08-21: qty 1

## 2019-08-21 MED ORDER — INSULIN GLARGINE 100 UNIT/ML ~~LOC~~ SOLN
15.0000 [IU] | SUBCUTANEOUS | Status: DC
  Administered 2019-08-22: 15 [IU] via SUBCUTANEOUS
  Filled 2019-08-21 (×2): qty 0.15

## 2019-08-21 MED ORDER — DEXTROSE 50 % IV SOLN: 0.0000 mL | INTRAVENOUS | Status: DC | PRN

## 2019-08-21 MED ORDER — TACROLIMUS 1 MG PO CAPS
2.0000 mg | ORAL_CAPSULE | Freq: Two times a day (BID) | ORAL | Status: DC
  Administered 2019-08-21 – 2019-08-24 (×6): 2 mg via ORAL
  Filled 2019-08-21 (×8): qty 2

## 2019-08-21 MED ORDER — INSULIN ASPART 100 UNIT/ML ~~LOC~~ SOLN: 0.0000 [IU] | Freq: Every day | SUBCUTANEOUS | Status: DC

## 2019-08-21 MED ORDER — SODIUM CHLORIDE 0.9 % IV BOLUS
500.0000 mL | Freq: Once | INTRAVENOUS | Status: AC
  Administered 2019-08-21: 17:00:00 500 mL via INTRAVENOUS

## 2019-08-21 MED ORDER — INSULIN ASPART 100 UNIT/ML ~~LOC~~ SOLN
0.0000 [IU] | Freq: Three times a day (TID) | SUBCUTANEOUS | Status: DC
  Administered 2019-08-22 (×2): 3 [IU] via SUBCUTANEOUS
  Administered 2019-08-23 – 2019-08-24 (×3): 1 [IU] via SUBCUTANEOUS
  Filled 2019-08-21 (×5): qty 1

## 2019-08-21 MED ORDER — FUROSEMIDE 20 MG PO TABS: 20.0000 mg | ORAL_TABLET | Freq: Every day | ORAL | Status: DC | PRN

## 2019-08-21 MED ORDER — ENOXAPARIN SODIUM 40 MG/0.4ML ~~LOC~~ SOLN
40.0000 mg | SUBCUTANEOUS | Status: DC
  Administered 2019-08-21 – 2019-08-23 (×3): 40 mg via SUBCUTANEOUS
  Filled 2019-08-21 (×3): qty 0.4

## 2019-08-21 MED ORDER — CHLORHEXIDINE GLUCONATE CLOTH 2 % EX PADS
6.0000 | MEDICATED_PAD | Freq: Every day | CUTANEOUS | Status: DC
  Administered 2019-08-23: 6 via TOPICAL
  Filled 2019-08-21 (×2): qty 6

## 2019-08-21 NOTE — ED Notes (Signed)
This RN spoke with Thurston Hole, RN  from ICU and she states that as long as Covid Swab is collected when bed is assigned pt will be able to go to the floor.

## 2019-08-21 NOTE — ED Triage Notes (Signed)
Patient reports she was seen Saturday for nephrotic syndrome. States she feel like she is getting worse. Having worsening pain. Also reports increased emesis with some blood. Reports high sugar at home. Patient states she thinks she needs to stay a few days because she doesn't think she and her mom can take care of her at home.

## 2019-08-21 NOTE — ED Triage Notes (Signed)
Says she feels like she is going into nephrotic syndrome

## 2019-08-21 NOTE — ED Provider Notes (Signed)
[facility] Emergency Department Provider Note   ____________________________________________   First MD Initiated Contact with Patient 08/21/19 1159     (approximate)  I have reviewed the triage vital signs and the nursing notes.   HISTORY  Chief Complaint Abdominal Pain and Emesis    HPI Catherine [name] is a 19 y.o. female history of diabetes, previous DKA, nephrotic syndrome  Patient presents today, was seen in the ER on Saturday  at that point started on prednisone and Lasix.  She reports she is continued to feel nauseated, having intermittent abdominal pain, vomiting frequently.  On Sunday evening she vomited some which she reports dry heaving to the point that she saw blood in her vomit.  However she is not seen any further blood in her vomit, did vomit however this morning.  She feels dry dehydrated but at the same time feeling swollen like she is got nephrotic syndrome.  No chest pain or shortness of breath.  Denies pregnancy.  No fevers or chills.  Denies Covid exposure.  No cough.  She gave herself an insulin bolus this morning of about 12 units from her pump.  Her pump is continue to have normal infusion has been not had any malfunction.  She did notice her blood sugars started to rise quite a bit after starting steroid over the weekend  Ports a moderate abdominal pain throughout associated nausea vomiting.  No black or bloody stool.  Past Medical History:  Diagnosis Date  . Diabetes mellitus without complication ([place])   . Nephritic syndrome     Patient Active Problem List   Diagnosis Date Noted  . DKA (diabetic ketoacidoses) ([place]) 07/28/2019  . Nephrotic syndrome due to type 1 diabetes mellitus ([place])   . Hyponatremia   . Acidosis     History reviewed. No pertinent surgical history.  Prior to Admission medications   Medication Sig Start Date End Date Taking? Authorizing Provider  furosemide (LASIX) 20 MG tablet Take 1 tablet (20  mg total) by mouth daily as needed for edema. 08/16/19 08/15/20  Lavonia Drafts, MD  Glucagon, rDNA, (GLUCAGON EMERGENCY) 1 MG KIT Inject 1 mg into the muscle once. 02/27/19   [provider]  NOVOLOG 100 UNIT/ML injection Inject 0-100 Units into the skin daily. Patient has insulin pump 01/08/17   [provider]  predniSONE (DELTASONE) 10 MG tablet Take 5 tablets daily until urine negative x 3 days, then taper gradually 08/16/19   Lavonia Drafts, MD  tacrolimus (PROGRAF) 1 MG capsule Take 2 mg by mouth 2 (two) times daily. 12/30/16   [provider]    Allergies Patient has no known allergies.  No family history on file.  Social History Social History   Tobacco Use  . Smoking status: Never Smoker  . Smokeless tobacco: Never Used  Substance Use Topics  . Alcohol use: Not Currently  . Drug use: Not Currently    Review of Systems Constitutional: No fever/chills Eyes: No visual changes. ENT: No sore throat.  Dry. Cardiovascular: Denies chest pain. Respiratory: Denies shortness of breath. Gastrointestinal: See HPI Genitourinary: Negative for dysuria.  Ports decreased urine production. Musculoskeletal: Negative for back pain. Skin: Negative for rash. Neurological: Negative for headaches, areas of focal weakness or numbness.    ____________________________________________   PHYSICAL EXAM:  VITAL SIGNS: ED Triage Vitals  Enc Vitals Group     BP 08/21/19 1139 107/68     Pulse Rate 08/21/19 1139 77     Resp 08/21/19  1139 16     Temp 08/21/19 1139 98 F (36.7 C)     Temp Source 08/21/19 1139 Oral     SpO2 08/21/19 1139 100 %     Weight 08/21/19 1138 120 lb (54.4 kg)     Height 08/21/19 1138 4' 11"  (1.499 m)     Head Circumference --      Peak Flow --      Pain Score 08/21/19 1138 7     Pain Loc --      Pain Edu? --      Excl. in [place]? --     Constitutional: Alert and oriented.  Chronically ill-appearing, especially given for age 70.  She is not in  acute distress, but appears somewhat fatigued in general. Eyes: Conjunctivae are normal. Head: Atraumatic. Nose: No congestion/rhinnorhea. Mouth/Throat: Mucous membranes are dry. Neck: No stridor.  Cardiovascular: Normal rate, regular rhythm. Grossly normal heart sounds.  Good peripheral circulation. Respiratory: Normal respiratory effort.  No retractions. Lungs CTAB. Gastrointestinal: Soft and she reports mild tenderness to palpation throughout, no rebound or guarding.  No evidence of acute abdomen or peritonitis . No distention. Musculoskeletal: No lower extremity tenderness. Neurologic:  Normal speech and language. No gross focal neurologic deficits are appreciated.  Skin:  Skin is warm, dry and intact. No rash noted.  She appears mildly edematous throughout her extremities Psychiatric: Mood and affect are normal. Speech and behavior are normal.  ____________________________________________   LABS (all labs ordered are listed, but only abnormal results are displayed)  Labs Reviewed  GLUCOSE, CAPILLARY - Abnormal; Notable for the following components:      Result Value   Glucose-Capillary 391 (*)    All other components within normal limits  CBC - Abnormal; Notable for the following components:   RBC 5.66 (*)    Hemoglobin 16.4 (*)    HCT 48.3 (*)    All other components within normal limits  COMPREHENSIVE METABOLIC PANEL - Abnormal; Notable for the following components:   Sodium 126 (*)    Potassium 5.2 (*)    Chloride 96 (*)    CO2 13 (*)    Glucose, Bld 471 (*)    BUN 45 (*)    Creatinine, Ser 1.26 (*)    Calcium 7.4 (*)    Total Protein 5.1 (*)    Albumin <1.0 (*)    Total Bilirubin 1.3 (*)    Anion gap 17 (*)    All other components within normal limits  BLOOD GAS, VENOUS - Abnormal; Notable for the following components:   pH, Ven 7.22 (*)    pCO2, Ven 33 (*)    Bicarbonate 13.5 (*)    Acid-base deficit 13.1 (*)    All other components within normal limits   BETA-HYDROXYBUTYRIC ACID - Abnormal; Notable for the following components:   Beta-Hydroxybutyric Acid 6.89 (*)    All other components within normal limits  SARS CORONAVIRUS 2 (TAT 6-24 HRS)  LIPASE, BLOOD  URINALYSIS, COMPLETE (UACMP) WITH MICROSCOPIC  POC URINE PREG, ED  CBG MONITORING, ED   ____________________________________________  EKG   ____________________________________________  RADIOLOGY   ____________________________________________   PROCEDURES  Procedure(s) performed: None  Procedures  Critical Care performed: No  ____________________________________________   INITIAL IMPRESSION / ASSESSMENT AND PLAN / ED COURSE  Pertinent labs & imaging results that were available during my care of the patient were reviewed by me and considered in my medical decision making (see chart for details).   Patient presents for evaluation  of nausea, feeling dehydrated, vomiting generalized abdominal pain fatigue.  Blood glucose have been elevated after use of steroid.  History of DKA in the past and based on her clinical exam a would not be surprised all she is back in DKA.  Does not appear to have evidence of acute abdomen.  ----------------------------------------- 2:07 PM on 08/21/2019 -----------------------------------------  Lab work appears indicative of DKA.  Mild at this time.  Patient receiving hydration, will write for additional bolus, start DKA management based off recommendations from the Endo tool algorithm.  Patient reports she feels much better after fluids and antiemetic.  She appears more comfortable.  No acute distress, discussed with the patient and her lab findings, she is comfortable and understanding of plan for admission.  Catherine [name] was evaluated in Emergency Department on 08/21/2019 for the symptoms described in the history of present illness. She was evaluated in the context of the global COVID-19 pandemic, which necessitated consideration  that the patient might be at risk for infection with the SARS-CoV-2 virus that causes COVID-19. Institutional protocols and algorithms that pertain to the evaluation of patients at risk for COVID-19 are in a state of rapid change based on information released by regulatory bodies including the CDC and federal and state organizations. These policies and algorithms were followed during the patient's care in the ED.         ____________________________________________   FINAL CLINICAL IMPRESSION(S) / ED DIAGNOSES  Final diagnoses:  Diabetic ketoacidosis without coma associated with type 1 diabetes mellitus ([place])        Note:  This document was prepared using Dragon voice recognition software and may include unintentional dictation errors       Delman Kitten, MD 08/21/19 1409

## 2019-08-21 NOTE — ED Notes (Signed)
bg 135

## 2019-08-21 NOTE — ED Notes (Signed)
Pt given meal tray per NP [place], pt tolerated well. Pt ate 90% of [place] sandwich, 100 % of applesauce cup, and bag of chips, and diet soda.

## 2019-08-21 NOTE — ED Notes (Signed)
This RN attempted to give report to ICU RN, states that they will call back when assignment is established.

## 2019-08-21 NOTE — ED Notes (Signed)
Insulin drip change confirmed by this RN and Raquel RN

## 2019-08-21 NOTE — H&P (Signed)
TRH H&P   Patient Demographics:    Catherine [name], is a 19 y.o. female  MRN: [id number]   DOB - Jun 06, 2001  Admit Date - 08/21/2019  Outpatient Primary MD for the patient is [facility]  Referring MD: Dr. Jacqualine Code  Outpatient Specialists: None  Patient coming from: Home  Chief Complaint  Patient presents with  . Abdominal Pain  . Emesis      HPI:    Catherine [name]  is a 19 y.o. female, with history of type 1 diabetes mellitus on insulin pump, uncontrolled, history of nephrotic syndrome (since age 62) with 2-3 flareups during the year requiring prednisone taper, on chronic tacrolimus.  Patient was hospitalized 3 weeks back for DKA.  She received a new insulin pump at that time.  She follows with Dr. Sharrie Rothman (endocrinologist).  She presented to the ED 5 days back with generalized edema with features similar to her nephrotic syndrome flareup.  She was discharged from the ED on oral prednisone taper (50 mg daily with 10 mg weekly taper).  Patient reports that 3 days back her sugars were elevated in the 200s to 300s which continued yesterday and this morning was persistently >400.  She administer 12 units insulin bolus from her pump but CBG was still elevated in the 400s. She had nausea with 2 episodes of vomiting of food today.  Reports abdominal discomfort with nausea and vomiting.  She felt weak and dizzy so came to the ED. Patient denies any fevers, chills, hematemesis, diarrhea,headache, blurred vision, chest pain, palpitations, shortness of breath, dysuria, tingling or numbness of her extremities.  Reports her insulin pump to be functioning fine.  Course in the ED Vitals were stable.  Patient appeared clinically dehydrated.  Blood work showed normal CBC, CBG of 391, she had sodium of 126, K of 5.2, chloride 96, CO2 13, anion gap of 17, glucose of 471, BUN  of 45, creatinine 1.26. VBG showed pH of 7.22, PCO2 33, bicarb of 13.5.  Beta hydroxybutyrate was elevated at 6.89. Patient ordered for 1.5 L normal saline bolus and insulin drip ordered.  DKA protocol initiated and hospitalist consulted for admission to stepdown unit for DKA.   Review of systems:    In addition to the HPI above No Fever-chills, No Headache, No changes with Vision or hearing, No problems swallowing food or Liquids, No Chest pain, Cough or Shortness of Breath, Abdominal pain +, nausea and vomiting + + + No Blood in stool or Urine, No dysuria, No new skin rashes or bruises, No new joints pains-aches,  Generalized weakness +, tingling, numbness in any extremity, No recent weight gain or loss, Polyuria +, no polydypsia or polyphagia, No significant Mental Stressors.     With Past History of the following :    Past Medical History:  Diagnosis Date  . Diabetes mellitus without complication ([place])   .  Nephritic syndrome       History reviewed. No pertinent surgical history.    Social History:     Social History   Tobacco Use  . Smoking status: Never Smoker  . Smokeless tobacco: Never Used  Substance Use Topics  . Alcohol use: Not Currently     Lives -Home  Mobility -independent     Family History :   No family history of heart disease or diabetes  Home Medications:   Prior to Admission medications   Medication Sig Start Date End Date Taking? Authorizing Provider  NOVOLOG 100 UNIT/ML injection Inject 0-100 Units into the skin daily. Patient has insulin pump 01/08/17  Yes [provider]  furosemide (LASIX) 20 MG tablet Take 1 tablet (20 mg total) by mouth daily as needed for edema. 08/16/19 08/15/20  Lavonia Drafts, MD  Glucagon, rDNA, (GLUCAGON EMERGENCY) 1 MG KIT Inject 1 mg into the muscle once. 02/27/19   [provider]  predniSONE (DELTASONE) 10 MG tablet Take 5 tablets daily until urine negative x 3 days, then taper gradually  08/16/19   Lavonia Drafts, MD  tacrolimus (PROGRAF) 1 MG capsule Take 2 mg by mouth 2 (two) times daily. 12/30/16   [provider]     Allergies:    No Known Allergies   Physical Exam:   Vitals  Blood pressure 107/68, pulse 77, temperature 98 F (36.7 C), temperature source Oral, resp. rate 16, height 4' 11"  (1.499 m), weight 54.4 kg, SpO2 99 %.  General: Young female lying in bed, appears fatigued, not in acute distress HEENT: Pupils reactive bilaterally, EOMI, no pallor, no icterus, dry oral mucosa, supple neck, no cervical pain with Chest: Clear to auscultation bilaterally, no added sound CVs: Normal S1-S2, no murmurs or gallop GI: Soft, nondistended, nontender, bowel sounds are Musculoskeletal: Warm, trace edema bilaterally CNs: Alert and oriented, nonfocal   Data Review:    CBC Recent Labs  Lab 08/16/19 0900 08/21/19 1200  WBC 4.2 10.3  HGB 14.5 16.4*  HCT 43.2 48.3*  PLT 241 321  MCV 85.9 85.3  MCH 28.8 29.0  MCHC 33.6 34.0  RDW 13.6 13.2   ------------------------------------------------------------------------------------------------------------------  Chemistries  Recent Labs  Lab 08/16/19 0900 08/21/19 1200  NA 132* 126*  K 4.2 5.2*  CL 103 96*  CO2 22 13*  GLUCOSE 375* 471*  BUN 20 45*  CREATININE 0.85 1.26*  CALCIUM 7.4* 7.4*  AST 17 23  ALT 11 11  ALKPHOS 103 96  BILITOT 0.5 1.3*   ------------------------------------------------------------------------------------------------------------------ estimated creatinine clearance is 54.1 mL/min (A) (by C-G formula based on SCr of 1.26 mg/dL (H)). ------------------------------------------------------------------------------------------------------------------ No results for input(s): TSH, T4TOTAL, T3FREE, THYROIDAB in the last 72 hours.  Invalid input(s): FREET3  Coagulation profile No results for input(s): INR, PROTIME in the last 168  hours. ------------------------------------------------------------------------------------------------------------------- No results for input(s): DDIMER in the last 72 hours. -------------------------------------------------------------------------------------------------------------------  Cardiac Enzymes No results for input(s): CKMB, TROPONINI, MYOGLOBIN in the last 168 hours.  Invalid input(s): CK ------------------------------------------------------------------------------------------------------------------ No results found for: BNP   ---------------------------------------------------------------------------------------------------------------  Urinalysis    Component Value Date/Time   COLORURINE YELLOW (A) 07/03/2017 1926   APPEARANCEUR CLOUDY (A) 07/03/2017 1926   LABSPEC 1.033 (H) 07/03/2017 1926   PHURINE 5.0 07/03/2017 1926   GLUCOSEU 50 (A) 07/03/2017 1926   HGBUR NEGATIVE 07/03/2017 1926   BILIRUBINUR NEGATIVE 07/03/2017 1926   KETONESUR 80 (A) 07/03/2017 1926   PROTEINUR 100 (A) 07/03/2017 1926   NITRITE NEGATIVE 07/03/2017 1926  LEUKOCYTESUR SMALL (A) 07/03/2017 1926    ----------------------------------------------------------------------------------------------------------------   Imaging Results:    No results found.  My personal review of EKG:   Assessment & Plan:    Active Problems:   DKA (diabetic ketoacidoses) ([place]) Suspect this is associated with recently started steroid taper for her nephrotic syndrome flareup.  Admit to stepdown unit.  Received 1.5 L normal saline in the ED.  Will order another 1 L bolus followed by maintenance normal saline at 150 cc/h.  Diabetic Endo tool protocol initiated for DKA and started on insulin drip.  Monitor BMET q4 hrs. continue insulin drip until anion gap closed + no further nausea and vomiting will start her back on her insulin pump. Diabetic coordinator consult. Hold prednisone for now.   Active  symptoms  Nephrotic syndrome with recent flare Was prescribed oral prednisone taper 5 days back.  I will hold her prednisone given her DKA.  Patient reports she was seeing a pediatric nephrologist and is in the process of transitioning to adult nephrologist.  Continue Prograf.  Check level. On clinical exam she does not have anasarca.  Patient can be discharged on low-dose prednisone taper upon discharge and if symptoms concerning as inpatient nephrology needs to be consulted.  Acute kidney injury Prerenal secondary to dehydration.  Monitor with IV fluids.  Avoid nephrotoxins.     DVT Prophylaxis subcu Lovenox  AM Labs Ordered, also please review Full Orders  Family Communication: Admission, patients condition and plan of care including tests being ordered have been discussed with the patient at bedside  Code Status full code  Likely DC to home in 48 hours once DKA resolved and symptoms improved.  Condition: Fair  Consults called: None  Admission status: Inpatient   Opinion that patient presented with severe DKA with metabolic acidosis and elevated beta hydroxybutyrate and severe dehydration with acute kidney injury for which she needs to be monitored in an inpatient setting (stepdown) with aggressive IV hydration and insulin drip.  For this she needs to be monitored for at least >2 midnight. She is at high risk for further deterioration with worsening of her DKA, severe acidosis and ATN.    Time spent in minutes 70   Emilya Justen M.D on 08/21/2019 at 2:54 PM  Between 7am to 7pm - Pager - [phone]. After 7pm go to [email or web address] - password [facility]  [facility] - Office  [phone]

## 2019-08-22 ENCOUNTER — Encounter: Payer: Self-pay | Admitting: Internal Medicine

## 2019-08-22 DIAGNOSIS — E1021 Type 1 diabetes mellitus with diabetic nephropathy: Secondary | ICD-10-CM

## 2019-08-22 DIAGNOSIS — E1010 Type 1 diabetes mellitus with ketoacidosis without coma: Principal | ICD-10-CM

## 2019-08-22 DIAGNOSIS — N179 Acute kidney failure, unspecified: Secondary | ICD-10-CM

## 2019-08-22 LAB — BASIC METABOLIC PANEL
Anion gap: 8 (ref 5–15)
Anion gap: 7 (ref 5–15)
Anion gap: 7 (ref 5–15)
BUN: 34 mg/dL — ABNORMAL HIGH (ref 6–20)
BUN: 33 mg/dL — ABNORMAL HIGH (ref 6–20)
BUN: 30 mg/dL — ABNORMAL HIGH (ref 6–20)
CO2: 20 mmol/L — ABNORMAL LOW (ref 22–32)
CO2: 20 mmol/L — ABNORMAL LOW (ref 22–32)
CO2: 19 mmol/L — ABNORMAL LOW (ref 22–32)
Calcium: 6.9 mg/dL — ABNORMAL LOW (ref 8.9–10.3)
Calcium: 6.9 mg/dL — ABNORMAL LOW (ref 8.9–10.3)
Calcium: 6.8 mg/dL — ABNORMAL LOW (ref 8.9–10.3)
Chloride: 101 mmol/L (ref 98–111)
Chloride: 104 mmol/L (ref 98–111)
Chloride: 104 mmol/L (ref 98–111)
Creatinine, Ser: 0.93 mg/dL (ref 0.44–1.00)
Creatinine, Ser: 0.82 mg/dL (ref 0.44–1.00)
Creatinine, Ser: 0.8 mg/dL (ref 0.44–1.00)
GFR calc Af Amer: >60 mL/min (ref >60)
GFR calc Af Amer: >60 mL/min (ref >60)
GFR calc Af Amer: >60 mL/min (ref >60)
GFR calc non Af Amer: >60 mL/min (ref >60)
GFR calc non Af Amer: >60 mL/min (ref >60)
GFR calc non Af Amer: >60 mL/min (ref >60)
Glucose, Bld: 207 mg/dL — ABNORMAL HIGH (ref 70–99)
Glucose, Bld: 242 mg/dL — ABNORMAL HIGH (ref 70–99)
Glucose, Bld: 257 mg/dL — ABNORMAL HIGH (ref 70–99)
Potassium: 3.7 mmol/L (ref 3.5–5.1)
Potassium: 4 mmol/L (ref 3.5–5.1)
Potassium: 4.2 mmol/L (ref 3.5–5.1)
Sodium: 129 mmol/L — ABNORMAL LOW (ref 135–145)
Sodium: 131 mmol/L — ABNORMAL LOW (ref 135–145)
Sodium: 130 mmol/L — ABNORMAL LOW (ref 135–145)

## 2019-08-22 LAB — GLUCOSE, CAPILLARY
Glucose-Capillary: 108 mg/dL — ABNORMAL HIGH (ref 70–99)
Glucose-Capillary: 113 mg/dL — ABNORMAL HIGH (ref 70–99)
Glucose-Capillary: 172 mg/dL — ABNORMAL HIGH (ref 70–99)
Glucose-Capillary: 182 mg/dL — ABNORMAL HIGH (ref 70–99)
Glucose-Capillary: 207 mg/dL — ABNORMAL HIGH (ref 70–99)
Glucose-Capillary: 207 mg/dL — ABNORMAL HIGH (ref 70–99)
Glucose-Capillary: 208 mg/dL — ABNORMAL HIGH (ref 70–99)
Glucose-Capillary: 74 mg/dL (ref 70–99)

## 2019-08-22 LAB — SARS CORONAVIRUS 2 (TAT 6-24 HRS): SARS Coronavirus 2: POSITIVE — AB

## 2019-08-22 LAB — BETA-HYDROXYBUTYRIC ACID
Beta-Hydroxybutyric Acid: 0.74 mmol/L — ABNORMAL HIGH (ref 0.05–0.27)
Beta-Hydroxybutyric Acid: 2.38 mmol/L — ABNORMAL HIGH (ref 0.05–0.27)

## 2019-08-22 LAB — MRSA PCR SCREENING: MRSA by PCR: NEGATIVE

## 2019-08-22 MED ORDER — DEXTROSE 50 % IV SOLN: 0.0000 mL | INTRAVENOUS | Status: DC | PRN

## 2019-08-22 MED ORDER — INSULIN GLARGINE 100 UNIT/ML ~~LOC~~ SOLN
10.0000 [IU] | Freq: Once | SUBCUTANEOUS | Status: AC
  Administered 2019-08-22: 12:00:00 10 [IU] via SUBCUTANEOUS
  Filled 2019-08-22: qty 0.1

## 2019-08-22 MED ORDER — INSULIN REGULAR(HUMAN) IN NACL 100-0.9 UT/100ML-% IV SOLN: INTRAVENOUS | Status: DC

## 2019-08-22 MED ORDER — ONDANSETRON HCL 4 MG/2ML IJ SOLN
4.0000 mg | INTRAMUSCULAR | Status: AC
  Administered 2019-08-22: 02:00:00 4 mg via INTRAVENOUS
  Filled 2019-08-22: qty 2

## 2019-08-22 MED ORDER — PHENOL 1.4 % MT LIQD
1.0000 | OROMUCOSAL | Status: DC | PRN
  Filled 2019-08-22: qty 177

## 2019-08-22 MED ORDER — INSULIN GLARGINE 100 UNIT/ML ~~LOC~~ SOLN
25.0000 [IU] | Freq: Every day | SUBCUTANEOUS | Status: DC
  Filled 2019-08-22: qty 0.25

## 2019-08-22 NOTE — ED Notes (Signed)
Pt is resting in bed and appears comfortable. Respirations are equal and unlabored, no signs of acute distress. 

## 2019-08-22 NOTE — ED Notes (Signed)
Pt is resting in bed, updated on room assignment and new plan of care. Pt states understanding and denies any further questions at this time.

## 2019-08-22 NOTE — Progress Notes (Signed)
Progress Note    Catherine [name]  OIZ:[id number] DOB: 2001/03/27  DOA: 08/21/2019 PCP: [facility]      Brief Narrative:    Medical records reviewed and are as summarized below:  Catherine [name] is an 19 y.o. female with echo history of type 1 diabetes mellitus on insulin pump, uncontrolled, history of nephrotic syndrome (since age 89) with 2-3 flareups during the year requiring prednisone taper, on chronic tacrolimus.  Patient was hospitalized 3 weeks prior to this admission for DKA.  She received a new insulin pump at that time.  She follows with Dr. Rich Brave (endocrinologist).  She presented to the ED 5 days prior to admission with generalized edema with features similar to her nephrotic syndrome flareup.  She was discharged from the ED on oral prednisone taper (50 mg daily with 10 mg weekly taper).  Patient reported that her blood sugar had been elevated in the past 3 days prior to admission with glucose levels in the 200s to 300s.  The day before admission her blood glucose level was greater than 400 persistently. She administer 12 units insulin bolus from her pump but CBG was still elevated in the 400s. She had nausea with 2 episodes of vomiting associated with abdominal discomfort on the day of admission.  She felt weak and dizzy so came to the ED. She was diagnosed with DKA and she also tested positive for coronavirus infection.  She was treated with IV insulin infusion and IV fluids.    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  Continue Lantus and NovoLog as needed.  AKI: Resolved.  Discontinue IV fluids to avoid fluid overload.  Nephrotic syndrome: Continue tacrolimus.  Patient said she only completed 6 days of 50 mg of prednisone.  She said she was supposed to taper  prednisone over a period of 5 weeks.  Hold prednisone for now.  Consulted nephrologist for further recommendations.  Hyponatremia: Probably hyperglycemia induced.  Monitor sodium level.  COVID-19 infection: Patient is asymptomatic from this.  She is tolerating room air.  No need for treatment at this time.   Body mass index is 24.24 kg/m.   Family Communication/Anticipated D/C date and plan/Code Status   DVT prophylaxis: Lovenox Code Status: Full code Family Communication: Plan discussed with the patient Disposition Plan: Patient is from home.  Possible discharge to home in 1 to 2 days and glucose levels improve and stabilize.      Subjective:   Patient had no complaints at the time of my visit this morning.  She was seen in the emergency room.  No vomiting, abdominal pain, diarrhea, nausea, shortness of breath, chest pain, cough.  Objective:    Vitals:   08/22/19 0830 08/22/19 1030 08/22/19 1037 08/22/19 1038  BP: 100/70 99/64 107/68   Pulse: 84 83  82  Resp:      Temp:      TempSrc:      SpO2: 100% 99%  100%  Weight:      Height:        Intake/Output Summary (Last 24 hours) at 08/22/2019 1136 Last data filed at 08/21/2019 1514 Gross per 24 hour  Intake 999 ml  Output --  Net 999 ml   Filed Weights   08/21/19 1138  Weight: 54.4 kg  Exam:  GEN: NAD SKIN: No rash EYES: EOMI ENT: MMM CV: RRR PULM: CTA B ABD: soft, ND, NT, +BS CNS: AAO x 3, non focal EXT: Bilateral leg edema (1+), no erythema or tenderness   Data Reviewed:   I have personally reviewed following labs and imaging studies:  Labs: Labs show the following:   Basic Metabolic Panel: Recent Labs  Lab 08/21/19 1630 08/21/19 1630 08/21/19 2306 08/21/19 2306 08/22/19 0102 08/22/19 0102 08/22/19 0505 08/22/19 0852  NA 129*  --  131*  --  129*  --  131* 130*  K 4.1   < > 3.4*   < > 3.7   < > 4.0 4.2  CL 98  --  103  --  101  --  104 104  CO2 16*  --  23  --  20*  --  20* 19*   GLUCOSE 394*  --  156*  --  207*  --  242* 257*  BUN 43*  --  34*  --  34*  --  33* 30*  CREATININE 1.24*  --  0.94  --  0.93  --  0.82 0.80  CALCIUM 7.3*  --  6.9*  --  6.9*  --  6.9* 6.8*   < > = values in this interval not displayed.   GFR Estimated Creatinine Clearance: 85.2 mL/min (by C-G formula based on SCr of 0.8 mg/dL). Liver Function Tests: Recent Labs  Lab 08/16/19 0900 08/21/19 1200  AST 17 23  ALT 11 11  ALKPHOS 103 96  BILITOT 0.5 1.3*  PROT 5.0* 5.1*  ALBUMIN <1.0* <1.0*   Recent Labs  Lab 08/16/19 0900 08/21/19 1200  LIPASE 28 29   No results for input(s): AMMONIA in the last 168 hours. Coagulation profile No results for input(s): INR, PROTIME in the last 168 hours.  CBC: Recent Labs  Lab 08/16/19 0900 08/21/19 1200 08/21/19 1630  WBC 4.2 10.3 12.3*  HGB 14.5 16.4* 15.8*  HCT 43.2 48.3* 47.0*  MCV 85.9 85.3 85.3  PLT 241 321 364   Cardiac Enzymes: No results for input(s): CKTOTAL, CKMB, CKMBINDEX, TROPONINI in the last 168 hours. BNP (last 3 results) No results for input(s): PROBNP in the last 8760 hours. CBG: Recent Labs  Lab 08/21/19 2351 08/22/19 0055 08/22/19 0157 08/22/19 0522 08/22/19 0851  GLUCAP 162* 182* 172* 208* 207*   D-Dimer: No results for input(s): DDIMER in the last 72 hours. Hgb A1c: No results for input(s): HGBA1C in the last 72 hours. Lipid Profile: No results for input(s): CHOL, HDL, LDLCALC, TRIG, CHOLHDL, LDLDIRECT in the last 72 hours. Thyroid function studies: No results for input(s): TSH, T4TOTAL, T3FREE, THYROIDAB in the last 72 hours.  Invalid input(s): FREET3 Anemia work up: No results for input(s): VITAMINB12, FOLATE, FERRITIN, TIBC, IRON, RETICCTPCT in the last 72 hours. Sepsis Labs: Recent Labs  Lab 08/16/19 0900 08/21/19 1200 08/21/19 1630  WBC 4.2 10.3 12.3*    Microbiology Recent Results (from the past 240 hour(s))  SARS CORONAVIRUS 2 (TAT 6-24 HRS) Nasopharyngeal Nasopharyngeal Swab      Status: Abnormal   Collection Time: 08/21/19  3:15 PM   Specimen: Nasopharyngeal Swab  Result Value Ref Range Status   SARS Coronavirus 2 POSITIVE (A) NEGATIVE Final    Comment: RESULT CALLED TO, READ BACK BY AND VERIFIED WITH: RN E MCCULLEY @0208  08/22/19 BY S GEZAHEGN (NOTE) SARS-CoV-2 target nucleic acids are DETECTED. The SARS-CoV-2 RNA is generally detectable in upper and lower respiratory specimens during  the acute phase of infection. Positive results are indicative of the presence of SARS-CoV-2 RNA. Clinical correlation with patient history and other diagnostic information is  necessary to determine patient infection status. Positive results do not rule out bacterial infection or co-infection with other viruses.  The expected result is Negative. Fact Sheet for Patients: HairSlick.no Fact Sheet for Healthcare Providers: quierodirigir.com This test is not yet approved or cleared by the [place] FDA and  has been authorized for detection and/or diagnosis of SARS-CoV-2 by FDA under an Emergency Use Authorization (EUA). This EUA will remain  in effect (meaning this test can be used) f or the duration of the COVID-19 declaration under Section 564(b)(1) of the Act, 21 U.S.C. section 360bbb-3(b)(1), unless the authorization is terminated or revoked sooner. Performed at [facility], [address], [place], [place] [address]   Respiratory Panel by RT PCR (Flu A&B, Covid) - Nasopharyngeal Swab     Status: Abnormal   Collection Time: 08/21/19  8:15 PM   Specimen: Nasopharyngeal Swab  Result Value Ref Range Status   SARS Coronavirus 2 by RT PCR POSITIVE (A) NEGATIVE Final    Comment: RESULT CALLED TO, READ BACK BY AND VERIFIED WITH: ALYSA MCCULLEY RN 21072/18/21 HNM (NOTE) SARS-CoV-2 target nucleic acids are DETECTED. SARS-CoV-2 RNA is generally detectable in upper respiratory specimens  during the acute phase of  infection. Positive results are indicative of the presence of the identified virus, but do not rule out bacterial infection or co-infection with other pathogens not detected by the test. Clinical correlation with patient history and other diagnostic information is necessary to determine patient infection status. The expected result is Negative. Fact Sheet for Patients:  https://www.moore.com/ Fact Sheet for Healthcare Providers: https://www.young.biz/ This test is not yet approved or cleared by the [place] FDA and  has been authorized for detection and/or diagnosis of SARS-CoV-2 by FDA under an Emergency Use Authorization (EUA).  This EUA will remain in effect (meaning this test can be used) for  the duration of  the COVID-19 declaration under Section 564(b)(1) of the Act, 21 U.S.C. section 360bbb-3(b)(1), unless the authorization is terminated or revoked sooner.    Influenza A by PCR NEGATIVE NEGATIVE Final   Influenza B by PCR NEGATIVE NEGATIVE Final    Comment: (NOTE) The Xpert Xpress SARS-CoV-2/FLU/RSV assay is intended as an aid in  the diagnosis of influenza from Nasopharyngeal swab specimens and  should not be used as a sole basis for treatment. Nasal washings and  aspirates are unacceptable for Xpert Xpress SARS-CoV-2/FLU/RSV  testing. Fact Sheet for Patients: https://www.moore.com/ Fact Sheet for Healthcare Providers: https://www.young.biz/ This test is not yet approved or cleared by the [place] FDA and  has been authorized for detection and/or diagnosis of SARS-CoV-2 by  FDA under an Emergency Use Authorization (EUA). This EUA will remain  in effect (meaning this test can be used) for the duration of the  Covid-19 declaration under Section 564(b)(1) of the Act, 21  U.S.C. section 360bbb-3(b)(1), unless the authorization is  terminated or revoked. Performed at [facility],  [address] [place], [place] [address]   MRSA PCR Screening     Status: None   Collection Time: 08/21/19 11:06 PM   Specimen: Nasal Mucosa; Nasopharyngeal  Result Value Ref Range Status   MRSA by PCR NEGATIVE NEGATIVE Final    Comment:        The GeneXpert MRSA Assay (FDA approved for NASAL specimens only), is one component of  a comprehensive MRSA colonization surveillance program. It is not intended to diagnose MRSA infection nor to guide or monitor treatment for MRSA infections. Performed at [facility], [address] [place], [place] [address]     Procedures and diagnostic studies:  No results found.  Medications:   . Chlorhexidine Gluconate Cloth  6 each Topical Daily  . enoxaparin (LOVENOX) injection  40 mg Subcutaneous Q24H  . insulin aspart  0-5 Units Subcutaneous QHS  . insulin aspart  0-9 Units Subcutaneous TID WC  . insulin aspart  3 Units Subcutaneous TID WC  . insulin glargine  10 Units Subcutaneous Once  . [START ON 08/23/2019] insulin glargine  25 Units Subcutaneous Daily  . tacrolimus  2 mg Oral BID   Continuous Infusions: . sodium chloride 75 mL/hr at 08/22/19 0203     LOS: 1 day   Bernadette Armijo  [facility]     08/22/2019, 11:36 AM

## 2019-08-22 NOTE — ED Notes (Signed)
This RN stopped Myxredlin and D5 1/2 NS at 0200, 2 hours after Lantus was given per Catherine [name] orders. Novolog administration not given due to blood glucose being 172 and order parameters not being met. Pt maintenance fluids switched to 0.9% NS. Pt is in bed resting at this time, and denies any further needs.

## 2019-08-22 NOTE — ED Notes (Signed)
Pt actively vomiting. Pt was assisted in cleaning up and was given an antiemetic. Nothing needed from this RN at this time

## 2019-08-22 NOTE — ED Notes (Signed)
Webb Silversmith, NP states that pt will receive AM Novolog coverage at 0800.

## 2019-08-22 NOTE — Plan of Care (Signed)
  Problem: Education: Goal: Knowledge of General Education information will improve Description Including pain rating scale, medication(s)/side effects and non-pharmacologic comfort measures Outcome: Progressing   Problem: Health Behavior/Discharge Planning: Goal: Ability to manage health-related needs will improve Outcome: Progressing   

## 2019-08-22 NOTE — Progress Notes (Addendum)
Inpatient Diabetes Program Recommendations  AACE/ADA: New Consensus Statement on Inpatient Glycemic Control (2015)  Target Ranges:  Prepandial:   less than 140 mg/dL      Peak postprandial:   less than 180 mg/dL (1-2 hours)      Critically ill patients:  140 - 180 mg/dL   Lab Results  Component Value Date   GLUCAP 208 (H) 08/22/2019   HGBA1C 12.9 (H) 07/28/2019    Review of Glycemic Control Results for Catherine [name], Catherine [name] (MRN [id number]) as of 08/22/2019 08:25  Ref. Range 08/22/2019 00:55 08/22/2019 01:57 08/22/2019 05:22  Glucose-Capillary Latest Ref Range: 70 - 99 mg/dL 008 (H) 676 (H) 195 (H)   Diabetes history: Type 1 DM- (makes no insulin) Outpatient Diabetes medications: Medtronic insulin pump Basal 12A      1.60 units/hour Total 24 hour basal: 38.4 units  Insulin Sensitivity  12A      1:25 (1 unit drops glucose 25 mg/dl)  Insulin Carb Coverage 12A      1:6 grams (1 unit covers 6 grams of carbs) 11A      1:5 grams (1 unit coverage 5 grams of carbs)  Current orders for Inpatient glycemic control:  IV insulin Lantus 15 units daily (first dose this morning) Novolog 3 units tid with meals Novolog sensitive tid with meals and HS  Inpatient Diabetes Program Recommendations:    Note history.  Unsure of what precipitated DKA.  Need to talk with patient and have her assess site to see if kinked.  Also note +COVID-19 which also could increase blood sugars.    Recommend increasing Lantus to 25 units daily (give Lantus 10 units x 1 this morning).  Also consider increasing Novolog to 5 units tid with meals. Due to repeated DKA admission, consider not restarting insulin pump for now and continue SQ insulin.    Thanks,  Beryl Meager, RN, BC-ADM Inpatient Diabetes Coordinator Pager [phone]   1400:  Spoke with patient by phone.  She states that her blood sugars starting rising after taking Prednisone for nephrotic syndrome.  She states that she started taking Prednisone 50  mg on Saturday, 08/16/19.  We discussed her site, and she states that the canula was not bent when she removed.  She states she is not eating much and feeling poorly.  Discussed plan to use Basal/bolus for now and not restart insulin pump.  She verbalized understanding and agrees that this would be best.  I encouraged her to report to nursing if she feels like blood sugars are increasing or if she is concerned about amounts of insulin administered.  She agrees.  Will ask DM coordinator to follow over weekend for needs.

## 2019-08-22 NOTE — Progress Notes (Signed)
Report given to 1C RN. Patient to be transported to room.

## 2019-08-22 NOTE — ED Notes (Signed)
This RN informed Webb Silversmith, NP of pt CBG of 208.

## 2019-08-23 ENCOUNTER — Encounter: Payer: Self-pay | Admitting: Internal Medicine

## 2019-08-23 DIAGNOSIS — IMO0002 Reserved for concepts with insufficient information to code with codable children: Secondary | ICD-10-CM

## 2019-08-23 DIAGNOSIS — E1029 Type 1 diabetes mellitus with other diabetic kidney complication: Secondary | ICD-10-CM

## 2019-08-23 DIAGNOSIS — N050 Unspecified nephritic syndrome with minor glomerular abnormality: Secondary | ICD-10-CM

## 2019-08-23 DIAGNOSIS — N049 Nephrotic syndrome with unspecified morphologic changes: Secondary | ICD-10-CM

## 2019-08-23 DIAGNOSIS — R809 Proteinuria, unspecified: Secondary | ICD-10-CM

## 2019-08-23 HISTORY — DX: Unspecified nephritic syndrome with minor glomerular abnormality: N05.0

## 2019-08-23 HISTORY — DX: Nephrotic syndrome with unspecified morphologic changes: N04.9

## 2019-08-23 LAB — BASIC METABOLIC PANEL
Anion gap: 7 (ref 5–15)
BUN: 24 mg/dL — ABNORMAL HIGH (ref 6–20)
CO2: 21 mmol/L — ABNORMAL LOW (ref 22–32)
Calcium: 7.2 mg/dL — ABNORMAL LOW (ref 8.9–10.3)
Chloride: 105 mmol/L (ref 98–111)
Creatinine, Ser: 0.79 mg/dL (ref 0.44–1.00)
GFR calc Af Amer: >60 mL/min (ref >60)
GFR calc non Af Amer: >60 mL/min (ref >60)
Glucose, Bld: 149 mg/dL — ABNORMAL HIGH (ref 70–99)
Potassium: 3.9 mmol/L (ref 3.5–5.1)
Sodium: 133 mmol/L — ABNORMAL LOW (ref 135–145)

## 2019-08-23 LAB — GLUCOSE, CAPILLARY
Glucose-Capillary: 138 mg/dL — ABNORMAL HIGH (ref 70–99)
Glucose-Capillary: 130 mg/dL — ABNORMAL HIGH (ref 70–99)
Glucose-Capillary: 78 mg/dL (ref 70–99)
Glucose-Capillary: 115 mg/dL — ABNORMAL HIGH (ref 70–99)
Glucose-Capillary: 114 mg/dL — ABNORMAL HIGH (ref 70–99)

## 2019-08-23 LAB — PROTEIN / CREATININE RATIO, URINE
Creatinine, Urine: 142 mg/dL
Protein Creatinine Ratio: 3.38 mg/mg{Cre} — ABNORMAL HIGH (ref 0.00–0.15)
Total Protein, Urine: 480 mg/dL

## 2019-08-23 MED ORDER — OXYCODONE HCL 5 MG PO TABS
5.0000 mg | ORAL_TABLET | Freq: Four times a day (QID) | ORAL | Status: DC | PRN
  Administered 2019-08-23 – 2019-08-24 (×3): 5 mg via ORAL
  Filled 2019-08-23 (×3): qty 1

## 2019-08-23 MED ORDER — INSULIN GLARGINE 100 UNIT/ML ~~LOC~~ SOLN
20.0000 [IU] | Freq: Every day | SUBCUTANEOUS | Status: DC
  Administered 2019-08-23: 20 [IU] via SUBCUTANEOUS
  Filled 2019-08-23 (×2): qty 0.2

## 2019-08-23 MED ORDER — MORPHINE SULFATE (PF) 2 MG/ML IV SOLN
2.0000 mg | Freq: Once | INTRAVENOUS | Status: AC
  Administered 2019-08-23: 2 mg via INTRAVENOUS
  Filled 2019-08-23: qty 1

## 2019-08-23 NOTE — Consult Note (Signed)
[facility]  CONSULT NOTE    Date: 08/23/2019                  Patient Name:  Catherine [name]  MRN: [id number]  DOB: 2001/06/15  Age / Sex: 19 y.o., female         PCP: [place]                 Service Requesting Consult: Dr. Mal Misty                 Reason for Consult: Nephrotic syndrome            History of Present Illness: Catherine [name] admitted to [facility] on 08/21/2019 for DKA (diabetic ketoacidoses) ([place]) [E11.10] Diabetic ketoacidosis without coma associated with type 1 diabetes mellitus ([place]) [E10.10]  Patient states she was given a prednisone taper for her flare up of nephrotic syndrome. This caused her to have hyperglycemia and caused her to go in diabetic ketoacidosis.    Medications: Outpatient medications: Medications Prior to Admission  Medication Sig Dispense Refill Last Dose  . furosemide (LASIX) 20 MG tablet Take 1 tablet (20 mg total) by mouth daily as needed for edema. 30 tablet 0 08/20/2019 at 0900  . NOVOLOG 100 UNIT/ML injection Inject 0-100 Units into the skin daily. Patient has insulin pump  0 08/21/2019 at Unknown time  . predniSONE (DELTASONE) 10 MG tablet Take 5 tablets daily until urine negative x 3 days, then taper gradually 100 tablet 0 08/20/2019 at 0900  . tacrolimus (PROGRAF) 1 MG capsule Take 2 mg by mouth 2 (two) times daily.  1 08/20/2019 at 2100  . Glucagon, rDNA, (GLUCAGON EMERGENCY) 1 MG KIT Inject 1 mg into the muscle once.   unknown at prn    Current medications: Current Facility-Administered Medications  Medication Dose Route Frequency Provider Last Rate Last Admin  . Chlorhexidine Gluconate Cloth 2 % PADS 6 each  6 each Topical Daily Dhungel, Nishant, MD   6 each at 08/23/19 0936  . dextrose 50 % solution 0-50 mL  0-50 mL Intravenous PRN Dhungel, Nishant, MD      . dextrose 50 % solution 0-50 mL  0-50 mL Intravenous PRN Dhungel, Nishant, MD      . enoxaparin (LOVENOX) injection 40  mg  40 mg Subcutaneous Q24H Dhungel, Nishant, MD   40 mg at 08/22/19 2157  . furosemide (LASIX) tablet 20 mg  20 mg Oral Daily PRN Dhungel, Nishant, MD      . insulin aspart (novoLOG) injection 0-5 Units  0-5 Units Subcutaneous QHS Ouma, Bing Neighbors, NP      . insulin aspart (novoLOG) injection 0-9 Units  0-9 Units Subcutaneous TID WC Lang Snow, NP   1 Units at 08/23/19 1135  . insulin aspart (novoLOG) injection 3 Units  3 Units Subcutaneous TID WC Lang Snow, NP   3 Units at 08/23/19 1136  . insulin glargine (LANTUS) injection 20 Units  20 Units Subcutaneous Daily Jennye Boroughs, MD   20 Units at 08/23/19 [phone]  . oxyCODONE (Oxy IR/ROXICODONE) immediate release tablet 5 mg  5 mg Oral Q6H PRN Jennye Boroughs, MD   5 mg at 08/23/19 1219  . phenol (CHLORASEPTIC) mouth spray 1 spray  1 spray Mouth/Throat PRN Jennye Boroughs, MD      . tacrolimus (PROGRAF) capsule 2 mg  2 mg Oral BID Dhungel, Nishant, MD   2 mg at 08/23/19 [phone]  Allergies: No Known Allergies    Past Medical History: Past Medical History:  Diagnosis Date  . Diabetes mellitus without complication ([place])   . Nephritic syndrome      Past Surgical History: History reviewed. No pertinent surgical history.   Family History: No family history on file.   Social History: Social History   Socioeconomic History  . Marital status: Single    Spouse name: Not on file  . Number of children: Not on file  . Years of education: Not on file  . Highest education level: Not on file  Occupational History  . Not on file  Tobacco Use  . Smoking status: Never Smoker  . Smokeless tobacco: Never Used  Substance and Sexual Activity  . Alcohol use: Not Currently  . Drug use: Not Currently  . Sexual activity: Not Currently  Other Topics Concern  . Not on file  Social History Narrative  . Not on file   Social Determinants of Health   Financial Resource Strain:   . Difficulty of Paying Living  Expenses: Not on file  Food Insecurity:   . Worried About Charity fundraiser in the Last Year: Not on file  . Ran Out of Food in the Last Year: Not on file  Transportation Needs:   . Lack of Transportation (Medical): Not on file  . Lack of Transportation (Non-Medical): Not on file  Physical Activity:   . Days of Exercise per Week: Not on file  . Minutes of Exercise per Session: Not on file  Stress:   . Feeling of Stress : Not on file  Social Connections:   . Frequency of Communication with Friends and Family: Not on file  . Frequency of Social Gatherings with Friends and Family: Not on file  . Attends Religious Services: Not on file  . Active Member of Clubs or Organizations: Not on file  . Attends Archivist Meetings: Not on file  . Marital Status: Not on file  Intimate Partner Violence:   . Fear of Current or Ex-Partner: Not on file  . Emotionally Abused: Not on file  . Physically Abused: Not on file  . Sexually Abused: Not on file     Review of Systems: Review of Systems  Constitutional: Positive for malaise/fatigue. Negative for chills, diaphoresis, fever and weight loss.  HENT: Negative.  Negative for congestion, ear discharge, ear pain, hearing loss, nosebleeds, sinus pain, sore throat and tinnitus.   Eyes: Negative.  Negative for blurred vision, double vision, photophobia, pain, discharge and redness.  Respiratory: Positive for shortness of breath. Negative for cough, hemoptysis, sputum production, wheezing and stridor.   Cardiovascular: Positive for leg swelling. Negative for chest pain, palpitations, orthopnea, claudication and PND.  Gastrointestinal: Positive for nausea and vomiting. Negative for abdominal pain, blood in stool, constipation, diarrhea, heartburn and melena.  Genitourinary: Negative.  Negative for dysuria, flank pain, frequency, hematuria and urgency.  Musculoskeletal: Negative.  Negative for back pain, falls, joint pain, myalgias and neck pain.   Skin: Negative.  Negative for itching and rash.  Neurological: Negative.  Negative for dizziness, tingling, tremors, sensory change, speech change, focal weakness, seizures, loss of consciousness, weakness and headaches.  Endo/Heme/Allergies: Negative.  Negative for environmental allergies and polydipsia. Does not bruise/bleed easily.  Psychiatric/Behavioral: Negative for depression, hallucinations, memory loss, substance abuse and suicidal ideas. The patient is not nervous/anxious and does not have insomnia.     Vital Signs: Blood pressure 114/78, pulse 88, temperature 98 F (36.7 C), temperature  source Oral, resp. rate 18, height _0  (1.499 m), weight 53.4 kg, SpO2 100 %.  Weight trends: Filed Weights   08/21/19 1138 08/22/19 1351  Weight: 54.4 kg 53.4 kg    Physical Exam: General: NAD, laying in bed  Head: Normocephalic, atraumatic. Moist oral mucosal membranes  Eyes: Anicteric, PERRL  Neck: Supple, trachea midline  Lungs:  Clear to auscultation  Heart: Regular rate and rhythm  Abdomen:  Soft, nontender,   Extremities: trace peripheral edema.  Neurologic: Nonfocal, moving all four extremities  Skin: No lesions         Lab results: Basic Metabolic Panel: Recent Labs  Lab 08/22/19 0505 08/22/19 0852 08/23/19 0617  NA 131* 130* 133*  K 4.0 4.2 3.9  CL 104 104 105  CO2 20* 19* 21*  GLUCOSE 242* 257* 149*  BUN 33* 30* 24*  CREATININE 0.82 0.80 0.79  CALCIUM 6.9* 6.8* 7.2*    Liver Function Tests: Recent Labs  Lab 08/21/19 1200  AST 23  ALT 11  ALKPHOS 96  BILITOT 1.3*  PROT 5.1*  ALBUMIN <1.0*   Recent Labs  Lab 08/21/19 1200  LIPASE 29   No results for input(s): AMMONIA in the last 168 hours.  CBC: Recent Labs  Lab 08/21/19 1200 08/21/19 1630  WBC 10.3 12.3*  HGB 16.4* 15.8*  HCT 48.3* 47.0*  MCV 85.3 85.3  PLT 321 364    Cardiac Enzymes: No results for input(s): CKTOTAL, CKMB, CKMBINDEX, TROPONINI in the last 168  hours.  BNP: Invalid input(s): POCBNP  CBG: Recent Labs  Lab 08/22/19 2107 08/22/19 2156 08/23/19 0735 08/23/19 1129 08/23/19 1415  GLUCAP 74 113* 138* 130* 68    Microbiology: Results for orders placed or performed during the hospital encounter of 08/21/19  SARS CORONAVIRUS 2 (TAT 6-24 HRS) Nasopharyngeal Nasopharyngeal Swab     Status: Abnormal   Collection Time: 08/21/19  3:15 PM   Specimen: Nasopharyngeal Swab  Result Value Ref Range Status   SARS Coronavirus 2 POSITIVE (A) NEGATIVE Final    Comment: RESULT CALLED TO, READ BACK BY AND VERIFIED WITH: RN E MCCULLEY _1  08/22/19 BY S GEZAHEGN (NOTE) SARS-CoV-2 target nucleic acids are DETECTED. The SARS-CoV-2 RNA is generally detectable in upper and lower respiratory specimens during the acute phase of infection. Positive results are indicative of the presence of SARS-CoV-2 RNA. Clinical correlation with patient history and other diagnostic information is  necessary to determine patient infection status. Positive results do not rule out bacterial infection or co-infection with other viruses.  The expected result is Negative. Fact Sheet for Patients: SugarRoll.be Fact Sheet for Healthcare Providers: https://www.woods-mathews.com/ This test is not yet approved or cleared by the [place] FDA and  has been authorized for detection and/or diagnosis of SARS-CoV-2 by FDA under an Emergency Use Authorization (EUA). This EUA will remain  in effect (meaning this test can be used) f or the duration of the COVID-19 declaration under Section 564(b)(1) of the Act, 21 U.S.C. section 360bbb-3(b)(1), unless the authorization is terminated or revoked sooner. Performed at [facility], [place] [address] [place], [place] [address]   Respiratory Panel by RT PCR (Flu A&B, Covid) - Nasopharyngeal Swab     Status: Abnormal   Collection Time: 08/21/19  8:15 PM   Specimen: Nasopharyngeal Swab   Result Value Ref Range Status   SARS Coronavirus 2 by RT PCR POSITIVE (A) NEGATIVE Final    Comment: RESULT CALLED TO, READ BACK BY AND VERIFIED WITH: ALYSA MCCULLEY  RN 21072/18/21 HNM (NOTE) SARS-CoV-2 target nucleic acids are DETECTED. SARS-CoV-2 RNA is generally detectable in upper respiratory specimens  during the acute phase of infection. Positive results are indicative of the presence of the identified virus, but do not rule out bacterial infection or co-infection with other pathogens not detected by the test. Clinical correlation with patient history and other diagnostic information is necessary to determine patient infection status. The expected result is Negative. Fact Sheet for Patients:  PinkCheek.be Fact Sheet for Healthcare Providers: GravelBags.it This test is not yet approved or cleared by the [place] FDA and  has been authorized for detection and/or diagnosis of SARS-CoV-2 by FDA under an Emergency Use Authorization (EUA).  This EUA will remain in effect (meaning this test can be used) for  the duration of  the COVID-19 declaration under Section 564(b)(1) of the Act, 21 U.S.C. section 360bbb-3(b)(1), unless the authorization is terminated or revoked sooner.    Influenza A by PCR NEGATIVE NEGATIVE Final   Influenza B by PCR NEGATIVE NEGATIVE Final    Comment: (NOTE) The Xpert Xpress SARS-CoV-2/FLU/RSV assay is intended as an aid in  the diagnosis of influenza from Nasopharyngeal swab specimens and  should not be used as a sole basis for treatment. Nasal washings and  aspirates are unacceptable for Xpert Xpress SARS-CoV-2/FLU/RSV  testing. Fact Sheet for Patients: PinkCheek.be Fact Sheet for Healthcare Providers: GravelBags.it This test is not yet approved or cleared by the [place] FDA and  has been authorized for detection and/or  diagnosis of SARS-CoV-2 by  FDA under an Emergency Use Authorization (EUA). This EUA will remain  in effect (meaning this test can be used) for the duration of the  Covid-19 declaration under Section 564(b)(1) of the Act, 21  U.S.C. section 360bbb-3(b)(1), unless the authorization is  terminated or revoked. Performed at [facility], [address] [place], [place] [address]   MRSA PCR Screening     Status: None   Collection Time: 08/21/19 11:06 PM   Specimen: Nasal Mucosa; Nasopharyngeal  Result Value Ref Range Status   MRSA by PCR NEGATIVE NEGATIVE Final    Comment:        The GeneXpert MRSA Assay (FDA approved for NASAL specimens only), is one component of a comprehensive MRSA colonization surveillance program. It is not intended to diagnose MRSA infection nor to guide or monitor treatment for MRSA infections. Performed at [facility], [address] [place], [place] [address]     Coagulation Studies: No results for input(s): LABPROT, INR in the last 72 hours.  Urinalysis: Recent Labs    08/21/19 1515  COLORURINE YELLOW*  LABSPEC 1.025  PHURINE 5.0  GLUCOSEU >=500*  HGBUR MODERATE*  BILIRUBINUR NEGATIVE  KETONESUR 20*  PROTEINUR >=300*  NITRITE NEGATIVE  LEUKOCYTESUR NEGATIVE      Imaging:  No results found.   Assessment & Plan: Catherine [name] is a 19 y.o. 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 Followed by [facility].  - Continue tacrolimus - Check tacrolimus trough - Check urine studies  2. 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   LOS: 2 [place] H[place] 2/20/20212:35  PM

## 2019-08-23 NOTE — Progress Notes (Addendum)
@  2015 Paged E. Ouma concerning Dr. Louann Sjogren discharge order to clarify if this is for tonight or tomorrow. @1930  Patient stated no nurse or provider has spoken with her about discharge or when she will discharge. , NP aware and noted there is no discharge summary present.  @2230  Dr. Reyes Ivan now off duty but notified this RN that discharge was ordered/intended for tonight and patient was aware of this. Patient now has no ride home for discharge at this time due to the late hour.

## 2019-08-23 NOTE — Plan of Care (Signed)
  Problem: Education: Goal: Knowledge of General Education information will improve Description Including pain rating scale, medication(s)/side effects and non-pharmacologic comfort measures Outcome: Progressing   

## 2019-08-23 NOTE — Discharge Summary (Addendum)
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  Diet recommendation: Low sugar diet  Code status: Full code    Hospital Course:   Catherine [name] is an 19 y.o. female with echo history of type 1 diabetes mellitus on insulin pump, uncontrolled, history of nephrotic syndrome (since age 77) with 2-3 flareups during the year requiring prednisone taper, on chronic tacrolimus. Patient was hospitalized 3 weeks prior to this admission for DKA. She received a new insulin pump at that time. She follows with Dr. Sharrie Rothman (endocrinologist). She presented to the ED 5 days prior to admission with generalized edema with features similar to her nephrotic syndrome flareup. She was discharged from the ED on oral prednisone taper (50 mg daily with 10 mg weekly taper). Patient reported that her blood sugar had been elevated in the past 3 days prior to admission with glucose levels in the 200s to 300s.  The day before admission her blood glucose level was greater than 400 persistently.She administer 12 units insulin bolus from her pump but CBG was still elevated in the 400s. She had nausea with 2 episodes of vomiting associated with abdominal discomfort on the day of admission. She felt weak and dizzy so came to the ED. She was diagnosed with DKA and she also tested positive for coronavirus infection.  She was  treated with IV insulin infusion and IV fluids and DKA has resolved.  Glucose levels have stabilized.  Nephrologist was consulted to assist with management of nephrotic syndrome and to make a decision whether patient would need prednisone and Lasix at discharge.  Nephrologist recommended urine protein creatinine ratio prior to discharge. Discharge plan was discussed with the patient and her nurse, McKenzie.  Patient was informed that she was to continue Lasix as needed for swelling but to discontinue prednisone.   ADDENDUM:  Patient had to stay another night in the hospital because she said she could not get a ride home.  This note will serve as a progress note for patient encounter on 08/23/2019.   Discharge Exam:   Vitals:   08/23/19 1700 08/23/19 1800  BP: 117/89   Pulse: 89 88  Resp: (!) 23 18  Temp:  98.1 F (36.7 C)  SpO2: 100% 100%   Vitals:   08/23/19 1500 08/23/19 1600 08/23/19 1700 08/23/19 1800  BP:  (!) 96/58 117/89   Pulse: 87 90 89 88  Resp: (!) 23 (!) 26 (!) 23 18  Temp:    98.1 F (36.7 C)  TempSrc:    Oral  SpO2: 99% 99% 100% 100%  Weight:      Height:         GEN: NAD SKIN: No rash EYES: EOMI ENT: MMM CV: RRR PULM: CTA B ABD: soft, mild distention, NT, +BS CNS: AAO x 3, non focal EXT: No edema or tenderness   The results of significant diagnostics from this hospitalization (including imaging, microbiology, ancillary and laboratory) are  listed below for reference.     Procedures and Diagnostic Studies:   No results found.   Labs:   Basic Metabolic Panel: Recent Labs  Lab 08/21/19 2306 08/21/19 2306 08/22/19 0102 08/22/19 0102 08/22/19 0505 08/22/19 0505 08/22/19 0852 08/23/19 0617  NA 131*  --  129*  --  131*  --  130* 133*  K 3.4*   < > 3.7   < > 4.0   < > 4.2 3.9  CL 103  --  101  --  104  --  104 105  CO2 23  --  20*  --  20*  --  19* 21*  GLUCOSE 156*  --  207*  --  242*  --  257* 149*  BUN 34*  --  34*  --  33*  --  30* 24*   CREATININE 0.94  --  0.93  --  0.82  --  0.80 0.79  CALCIUM 6.9*  --  6.9*  --  6.9*  --  6.8* 7.2*   < > = values in this interval not displayed.   GFR Estimated Creatinine Clearance: 84.5 mL/min (by C-G formula based on SCr of 0.79 mg/dL). Liver Function Tests: Recent Labs  Lab 08/21/19 1200  AST 23  ALT 11  ALKPHOS 96  BILITOT 1.3*  PROT 5.1*  ALBUMIN <1.0*   Recent Labs  Lab 08/21/19 1200  LIPASE 29   No results for input(s): AMMONIA in the last 168 hours. Coagulation profile No results for input(s): INR, PROTIME in the last 168 hours.  CBC: Recent Labs  Lab 08/21/19 1200 08/21/19 1630  WBC 10.3 12.3*  HGB 16.4* 15.8*  HCT 48.3* 47.0*  MCV 85.3 85.3  PLT 321 364   Cardiac Enzymes: No results for input(s): CKTOTAL, CKMB, CKMBINDEX, TROPONINI in the last 168 hours. BNP: Invalid input(s): POCBNP CBG: Recent Labs  Lab 08/22/19 2156 08/23/19 0735 08/23/19 1129 08/23/19 1415 08/23/19 1626  GLUCAP 113* 138* 130* 78 115*   D-Dimer No results for input(s): DDIMER in the last 72 hours. Hgb A1c No results for input(s): HGBA1C in the last 72 hours. Lipid Profile No results for input(s): CHOL, HDL, LDLCALC, TRIG, CHOLHDL, LDLDIRECT in the last 72 hours. Thyroid function studies No results for input(s): TSH, T4TOTAL, T3FREE, THYROIDAB in the last 72 hours.  Invalid input(s): FREET3 Anemia work up No results for input(s): VITAMINB12, FOLATE, FERRITIN, TIBC, IRON, RETICCTPCT in the last 72 hours. Microbiology Recent Results (from the past 240 hour(s))  SARS CORONAVIRUS 2 (TAT 6-24 HRS) Nasopharyngeal Nasopharyngeal Swab     Status: Abnormal   Collection Time: 08/21/19  3:15 PM   Specimen: Nasopharyngeal Swab  Result Value Ref Range Status   SARS Coronavirus 2 POSITIVE (A) NEGATIVE Final    Comment: RESULT CALLED TO, READ BACK BY AND VERIFIED WITH: RN E MCCULLEY @0208  08/22/19 BY S GEZAHEGN (NOTE) SARS-CoV-2 target nucleic acids are DETECTED. The  SARS-CoV-2 RNA is generally detectable in upper and lower respiratory specimens during the acute phase of infection. Positive results are indicative of the presence of SARS-CoV-2 RNA. Clinical correlation with patient history and other diagnostic information is  necessary to determine patient infection status. Positive results do not rule out bacterial infection or co-infection with other viruses.  The expected result is Negative. Fact Sheet for Patients: SugarRoll.be Fact Sheet for Healthcare Providers: https://www.woods-mathews.com/ This test is not yet approved or cleared by the [place] FDA and  has been authorized for detection and/or diagnosis of  SARS-CoV-2 by FDA under an Emergency Use Authorization (EUA). This EUA will remain  in effect (meaning this test can be used) f or the duration of the COVID-19 declaration under Section 564(b)(1) of the Act, 21 U.S.C. section 360bbb-3(b)(1), unless the authorization is terminated or revoked sooner. Performed at [facility], [address] [place], [place] [address]   Respiratory Panel by RT PCR (Flu A&B, Covid) - Nasopharyngeal Swab     Status: Abnormal   Collection Time: 08/21/19  8:15 PM   Specimen: Nasopharyngeal Swab  Result Value Ref Range Status   SARS Coronavirus 2 by RT PCR POSITIVE (A) NEGATIVE Final    Comment: RESULT CALLED TO, READ BACK BY AND VERIFIED WITH: ALYSA MCCULLEY RN 21072/18/21 HNM (NOTE) SARS-CoV-2 target nucleic acids are DETECTED. SARS-CoV-2 RNA is generally detectable in upper respiratory specimens  during the acute phase of infection. Positive results are indicative of the presence of the identified virus, but do not rule out bacterial infection or co-infection with other pathogens not detected by the test. Clinical correlation with patient history and other diagnostic information is necessary to determine patient infection status. The expected result is  Negative. Fact Sheet for Patients:  PinkCheek.be Fact Sheet for Healthcare Providers: GravelBags.it This test is not yet approved or cleared by the [place] FDA and  has been authorized for detection and/or diagnosis of SARS-CoV-2 by FDA under an Emergency Use Authorization (EUA).  This EUA will remain in effect (meaning this test can be used) for  the duration of  the COVID-19 declaration under Section 564(b)(1) of the Act, 21 U.S.C. section 360bbb-3(b)(1), unless the authorization is terminated or revoked sooner.    Influenza A by PCR NEGATIVE NEGATIVE Final   Influenza B by PCR NEGATIVE NEGATIVE Final    Comment: (NOTE) The Xpert Xpress SARS-CoV-2/FLU/RSV assay is intended as an aid in  the diagnosis of influenza from Nasopharyngeal swab specimens and  should not be used as a sole basis for treatment. Nasal washings and  aspirates are unacceptable for Xpert Xpress SARS-CoV-2/FLU/RSV  testing. Fact Sheet for Patients: PinkCheek.be Fact Sheet for Healthcare Providers: GravelBags.it This test is not yet approved or cleared by the [place] FDA and  has been authorized for detection and/or diagnosis of SARS-CoV-2 by  FDA under an Emergency Use Authorization (EUA). This EUA will remain  in effect (meaning this test can be used) for the duration of the  Covid-19 declaration under Section 564(b)(1) of the Act, 21  U.S.C. section 360bbb-3(b)(1), unless the authorization is  terminated or revoked. Performed at [facility], [address] [place], [place] [address]   MRSA PCR Screening     Status: None   Collection Time: 08/21/19 11:06 PM   Specimen: Nasal Mucosa; Nasopharyngeal  Result Value Ref Range Status   MRSA by PCR NEGATIVE NEGATIVE Final    Comment:        The GeneXpert MRSA Assay (FDA approved for NASAL specimens only), is one component  of a comprehensive MRSA colonization surveillance program. It is not intended to diagnose MRSA infection nor to guide or monitor treatment for MRSA infections. Performed at [facility], [address], [place],  [address]      Discharge Instructions:   Discharge Instructions    Diet - low sodium heart healthy   Complete by: As directed    Diet Carb Modified   Complete by: As directed    Increase activity slowly   Complete by: As directed  Allergies as of 08/23/2019   No Known Allergies     Medication List    STOP taking these medications   predniSONE 10 MG tablet Commonly known as: DELTASONE     TAKE these medications   furosemide 20 MG tablet Commonly known as: Lasix Take 1 tablet (20 mg total) by mouth daily as needed for edema.   Glucagon Emergency 1 MG Kit Inject 1 mg into the muscle once.   NovoLOG 100 UNIT/ML injection Generic drug: insulin aspart Inject 0-100 Units into the skin daily. Patient has insulin pump   tacrolimus 1 MG capsule Commonly known as: PROGRAF Take 2 mg by mouth 2 (two) times daily.           Signed:  Jennye Boroughs  [facility] 08/23/2019, 7:02 PM

## 2019-08-24 LAB — BASIC METABOLIC PANEL
Anion gap: 4 — ABNORMAL LOW (ref 5–15)
BUN: 17 mg/dL (ref 6–20)
CO2: 22 mmol/L (ref 22–32)
Calcium: 7.2 mg/dL — ABNORMAL LOW (ref 8.9–10.3)
Chloride: 110 mmol/L (ref 98–111)
Creatinine, Ser: 0.62 mg/dL (ref 0.44–1.00)
GFR calc Af Amer: >60 mL/min (ref >60)
GFR calc non Af Amer: >60 mL/min (ref >60)
Glucose, Bld: 68 mg/dL — ABNORMAL LOW (ref 70–99)
Potassium: 3.5 mmol/L (ref 3.5–5.1)
Sodium: 136 mmol/L (ref 135–145)

## 2019-08-24 LAB — GLUCOSE, CAPILLARY
Glucose-Capillary: 124 mg/dL — ABNORMAL HIGH (ref 70–99)
Glucose-Capillary: 183 mg/dL — ABNORMAL HIGH (ref 70–99)

## 2019-08-24 MED ORDER — NOVOLOG 100 UNIT/ML ~~LOC~~ SOLN: 0.0000 [IU] | Freq: Every day | SUBCUTANEOUS | 0 refills | Status: DC

## 2019-08-24 MED ORDER — IOHEXOL 9 MG/ML PO SOLN: 500.0000 mL | ORAL | Status: AC

## 2019-08-24 MED ORDER — INSULIN GLARGINE 100 UNIT/ML ~~LOC~~ SOLN
15.0000 [IU] | Freq: Every day | SUBCUTANEOUS | Status: DC
  Administered 2019-08-24: 11:00:00 15 [IU] via SUBCUTANEOUS
  Filled 2019-08-24: qty 0.15

## 2019-08-24 MED ORDER — NOVOLOG FLEXPEN 100 UNIT/ML ~~LOC~~ SOPN: PEN_INJECTOR | SUBCUTANEOUS | 0 refills | Status: DC

## 2019-08-24 NOTE — Progress Notes (Signed)
[facility]  ROUNDING NOTE   Subjective:   Patient feels better.  Does not want a CT scan after learning how much oral contrast is required.   Objective:  Vital signs in last 24 hours:  Temp:  [97.6 F (36.4 C)-98.4 F (36.9 C)] 98.4 F (36.9 C) (02/21 0736) Pulse Rate:  [73-91] 73 (02/21 0014) Resp:  [16-26] 16 (02/21 0014) BP: (96-117)/(58-89) 109/76 (02/21 0736) SpO2:  [99 %-100 %] 100 % (02/21 0014)  Weight change:  Filed Weights   08/21/19 1138 08/22/19 1351  Weight: 54.4 kg 53.4 kg    Intake/Output: I/O last 3 completed shifts: In: 120 [P.O.:120] Out: -    Intake/Output this shift:  No intake/output data recorded.  Physical Exam: General: NAD,   Head: Normocephalic, atraumatic. Moist oral mucosal membranes  Eyes: Anicteric, PERRL  Neck: Supple, trachea midline  Lungs:  Clear to auscultation  Heart: Regular rate and rhythm  Abdomen:  Soft, mild tenderness  Extremities:  no peripheral edema.  Neurologic: Nonfocal, moving all four extremities  Skin: No lesions        Basic Metabolic Panel: Recent Labs  Lab 08/22/19 0102 08/22/19 0102 08/22/19 0505 08/22/19 0505 08/22/19 0852 08/23/19 0617 08/24/19 0431  NA 129*  --  131*  --  130* 133* 136  K 3.7  --  4.0  --  4.2 3.9 3.5  CL 101  --  104  --  104 105 110  CO2 20*  --  20*  --  19* 21* 22  GLUCOSE 207*  --  242*  --  257* 149* 68*  BUN 34*  --  33*  --  30* 24* 17  CREATININE 0.93  --  0.82  --  0.80 0.79 0.62  CALCIUM 6.9*   < > 6.9*   < > 6.8* 7.2* 7.2*   < > = values in this interval not displayed.    Liver Function Tests: Recent Labs  Lab 08/21/19 1200  AST 23  ALT 11  ALKPHOS 96  BILITOT 1.3*  PROT 5.1*  ALBUMIN <1.0*   Recent Labs  Lab 08/21/19 1200  LIPASE 29   No results for input(s): AMMONIA in the last 168 hours.  CBC: Recent Labs  Lab 08/21/19 1200 08/21/19 1630  WBC 10.3 12.3*  HGB 16.4* 15.8*  HCT 48.3* 47.0*  MCV 85.3 85.3  PLT 321 364     Cardiac Enzymes: No results for input(s): CKTOTAL, CKMB, CKMBINDEX, TROPONINI in the last 168 hours.  BNP: Invalid input(s): POCBNP  CBG: Recent Labs  Lab 08/23/19 1415 08/23/19 1626 08/23/19 2136 08/24/19 0735 08/24/19 1026  GLUCAP 78 115* 114* 124* 183*    Microbiology: Results for orders placed or performed during the hospital encounter of 08/21/19  SARS CORONAVIRUS 2 (TAT 6-24 HRS) Nasopharyngeal Nasopharyngeal Swab     Status: Abnormal   Collection Time: 08/21/19  3:15 PM   Specimen: Nasopharyngeal Swab  Result Value Ref Range Status   SARS Coronavirus 2 POSITIVE (A) NEGATIVE Final    Comment: RESULT CALLED TO, READ BACK BY AND VERIFIED WITH: RN E MCCULLEY @0208  08/22/19 BY S GEZAHEGN (NOTE) SARS-CoV-2 target nucleic acids are DETECTED. The SARS-CoV-2 RNA is generally detectable in upper and lower respiratory specimens during the acute phase of infection. Positive results are indicative of the presence of SARS-CoV-2 RNA. Clinical correlation with patient history and other diagnostic information is  necessary to determine patient infection status. Positive results do not rule out bacterial infection or  co-infection with other viruses.  The expected result is Negative. Fact Sheet for Patients: SugarRoll.be Fact Sheet for Healthcare Providers: https://www.woods-mathews.com/ This test is not yet approved or cleared by the [place] FDA and  has been authorized for detection and/or diagnosis of SARS-CoV-2 by FDA under an Emergency Use Authorization (EUA). This EUA will remain  in effect (meaning this test can be used) f or the duration of the COVID-19 declaration under Section 564(b)(1) of the Act, 21 U.S.C. section 360bbb-3(b)(1), unless the authorization is terminated or revoked sooner. Performed at [facility], [place] [address], [place], [place] [address]   Respiratory Panel by RT PCR (Flu A&B, Covid) -  Nasopharyngeal Swab     Status: Abnormal   Collection Time: 08/21/19  8:15 PM   Specimen: Nasopharyngeal Swab  Result Value Ref Range Status   SARS Coronavirus 2 by RT PCR POSITIVE (A) NEGATIVE Final    Comment: RESULT CALLED TO, READ BACK BY AND VERIFIED WITH: ALYSA MCCULLEY RN 21072/18/21 HNM (NOTE) SARS-CoV-2 target nucleic acids are DETECTED. SARS-CoV-2 RNA is generally detectable in upper respiratory specimens  during the acute phase of infection. Positive results are indicative of the presence of the identified virus, but do not rule out bacterial infection or co-infection with other pathogens not detected by the test. Clinical correlation with patient history and other diagnostic information is necessary to determine patient infection status. The expected result is Negative. Fact Sheet for Patients:  PinkCheek.be Fact Sheet for Healthcare Providers: GravelBags.it This test is not yet approved or cleared by the [place] FDA and  has been authorized for detection and/or diagnosis of SARS-CoV-2 by FDA under an Emergency Use Authorization (EUA).  This EUA will remain in effect (meaning this test can be used) for  the duration of  the COVID-19 declaration under Section 564(b)(1) of the Act, 21 U.S.C. section 360bbb-3(b)(1), unless the authorization is terminated or revoked sooner.    Influenza A by PCR NEGATIVE NEGATIVE Final   Influenza B by PCR NEGATIVE NEGATIVE Final    Comment: (NOTE) The Xpert Xpress SARS-CoV-2/FLU/RSV assay is intended as an aid in  the diagnosis of influenza from Nasopharyngeal swab specimens and  should not be used as a sole basis for treatment. Nasal washings and  aspirates are unacceptable for Xpert Xpress SARS-CoV-2/FLU/RSV  testing. Fact Sheet for Patients: PinkCheek.be Fact Sheet for Healthcare Providers: GravelBags.it This test  is not yet approved or cleared by the [place] FDA and  has been authorized for detection and/or diagnosis of SARS-CoV-2 by  FDA under an Emergency Use Authorization (EUA). This EUA will remain  in effect (meaning this test can be used) for the duration of the  Covid-19 declaration under Section 564(b)(1) of the Act, 21  U.S.C. section 360bbb-3(b)(1), unless the authorization is  terminated or revoked. Performed at [facility], [address] [place], [place] [address]   MRSA PCR Screening     Status: None   Collection Time: 08/21/19 11:06 PM   Specimen: Nasal Mucosa; Nasopharyngeal  Result Value Ref Range Status   MRSA by PCR NEGATIVE NEGATIVE Final    Comment:        The GeneXpert MRSA Assay (FDA approved for NASAL specimens only), is one component of a comprehensive MRSA colonization surveillance program. It is not intended to diagnose MRSA infection nor to guide or monitor treatment for MRSA infections. Performed at [facility], [address], [place], [place] [address]     Coagulation Studies:  No results for input(s): LABPROT, INR in the last 72 hours.  Urinalysis: Recent Labs    08/21/19 1515  COLORURINE YELLOW*  LABSPEC 1.025  PHURINE 5.0  GLUCOSEU >=500*  HGBUR MODERATE*  BILIRUBINUR NEGATIVE  KETONESUR 20*  PROTEINUR >=300*  NITRITE NEGATIVE  LEUKOCYTESUR NEGATIVE      Imaging: No results found.   Medications:    . Chlorhexidine Gluconate Cloth  6 each Topical Daily  . enoxaparin (LOVENOX) injection  40 mg Subcutaneous Q24H  . insulin aspart  0-5 Units Subcutaneous QHS  . insulin aspart  0-9 Units Subcutaneous TID WC  . insulin aspart  3 Units Subcutaneous TID WC  . insulin glargine  15 Units Subcutaneous Daily  . iohexol  500 mL Oral Q1 Hr x 2  . tacrolimus  2 mg Oral BID   dextrose, dextrose, furosemide, oxyCODONE, phenol  Assessment/ Plan:  Catherine [name] is a 19 y.o. black female with type I  diabetes mellitus, minimal change disease/nephrotic syndrome with last renal biopsy in 2015 on tacrolimus, who was admitted to [facility] on 08/21/2019 for DKA (diabetic ketoacidoses) (HCC) [E11.10] Diabetic ketoacidosis without coma associated with type 1 diabetes mellitus (HCC) [E10.10]   1. 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 Pending tacrolimus trough. Greater than 3.3 grams.  - Will need repeat renal biopsy at some point.   2. Diabetes mellitus type I with renal manifestations of glycosuria: hemoglobin A1c of 12.9% on 07/28/19.   Will need hospital follow up. Will see if we can schedule patient for hospital follow up with Nephrology this week.    LOS: 3 Betha Shadix 2/21/202112:03 PM

## 2019-08-24 NOTE — Progress Notes (Signed)
Pt discharged to home via private vehicle. IV's removed, Vitals stable, no c/o pain. Verbalized understanding of all education provided prior to leaving, to include importance of keeping all scheduled appointments and taking all meds as scheduled.

## 2019-08-24 NOTE — Discharge Summary (Signed)
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  Diet recommendation: Low sugar diet  Code status: Full code.    Hospital Course:   Catherine [name] an 19 y.o.femalewithechohistory of type 1 diabetes mellitus on insulin pump, uncontrolled, history of nephrotic syndrome (since age 38) with 2-3 flareups during the year requiring prednisone taper, on chronic tacrolimus. Patient was hospitalized 3 weeksprior to this admission forDKA. She received a new insulin pump at that time. She follows with Dr. Sharrie Rothman (endocrinologist). She presented to the ED 5 daysprior to admission withgeneralized edema with features similar to her nephrotic syndrome flareup. She was discharged from the ED on oral prednisone taper (50 mg daily with 10 mg weekly taper). Patient reportedthat her blood sugar had been elevated in the past 3 days prior to admission with glucose levels in the200s to 300s. The day before admission her blood glucose level was greater than 400 persistently.She administer 12 units insulin bolus from her pump but CBG was still elevated in the 400s. She had nausea with 2 episodes of vomitingassociated with abdominal discomfort on the day of admission.She felt weak and dizzy so came to the ED.She was diagnosed with DKA and she also tested positive for coronavirus  infection. She was treated with IV insulin infusion and IV fluids and DKA has resolved.  Glucose levels have stabilized.  Nephrologist was consulted to assist with management of nephrotic syndrome and to make a decision whether patient would need prednisone and Lasix at discharge.  Nephrologist recommended urine protein creatinine ratio prior to discharge.  This showed nephrotic range proteinuria.  Nephrologist was of the view that patient likely has has nephrotic syndrome from her diabetes while on minimal-change disease.  He recommended that steroids be discontinued.  Patient complained of abdominal pain on the day of discharge and she attributed this to swelling around her abdomen.  Initially, she was agreeable to a CT abdomen and pelvis for further evaluation.  However, she rescinded her decision and decided against CT abdomen and pelvis.  Her condition has improved and she is deemed stable for discharge to home today.  She was given refills for her NovoLog for insulin pump.  Catherine Breslow, RN, assisted with coordination of care and discharge.      Discharge Exam:   Vitals:   08/24/19 0014 08/24/19 0736  BP: 103/69 109/76  Pulse: 73   Resp: 16   Temp: 97.6 F (36.4 C) 98.4 F (36.9 C)  SpO2: 100%    Vitals:   08/23/19 1800 08/23/19 1930 08/24/19 0014 08/24/19 0736  BP:  107/82 103/69 109/76  Pulse: 88 88 73   Resp: 18 (!) 22 16   Temp: 98.1 F (36.7 C) 97.6 F (36.4 C) 97.6 F (36.4 C) 98.4 F (36.9 C)  TempSrc: Oral Oral Oral Oral  SpO2: 100% 100% 100%   Weight:      Height:         GEN:  NAD SKIN: No rash EYES: EOMI ENT: MMM CV: RRR PULM: CTA B ABD: soft, ND, mild periumbilical tenderness without rebound tenderness or guarding, +BS CNS: AAO x 3, non focal EXT: No edema or tenderness   The results of significant diagnostics from this hospitalization (including imaging, microbiology, ancillary and laboratory) are listed below for reference.     Procedures and  Diagnostic Studies:   No results found.   Labs:   Basic Metabolic Panel: Recent Labs  Lab 08/22/19 0102 08/22/19 0102 08/22/19 0505 08/22/19 0505 08/22/19 2202 08/22/19 5427 08/23/19 0617 08/24/19 0431  NA 129*  --  131*  --  130*  --  133* 136  K 3.7   < > 4.0   < > 4.2   < > 3.9 3.5  CL 101  --  104  --  104  --  105 110  CO2 20*  --  20*  --  19*  --  21* 22  GLUCOSE 207*  --  242*  --  257*  --  149* 68*  BUN 34*  --  33*  --  30*  --  24* 17  CREATININE 0.93  --  0.82  --  0.80  --  0.79 0.62  CALCIUM 6.9*  --  6.9*  --  6.8*  --  7.2* 7.2*   < > = values in this interval not displayed.   GFR Estimated Creatinine Clearance: 84.5 mL/min (by C-G formula based on SCr of 0.62 mg/dL). Liver Function Tests: Recent Labs  Lab 08/21/19 1200  AST 23  ALT 11  ALKPHOS 96  BILITOT 1.3*  PROT 5.1*  ALBUMIN <1.0*   Recent Labs  Lab 08/21/19 1200  LIPASE 29   No results for input(s): AMMONIA in the last 168 hours. Coagulation profile No results for input(s): INR, PROTIME in the last 168 hours.  CBC: Recent Labs  Lab 08/21/19 1200 08/21/19 1630  WBC 10.3 12.3*  HGB 16.4* 15.8*  HCT 48.3* 47.0*  MCV 85.3 85.3  PLT 321 364   Cardiac Enzymes: No results for input(s): CKTOTAL, CKMB, CKMBINDEX, TROPONINI in the last 168 hours. BNP: Invalid input(s): POCBNP CBG: Recent Labs  Lab 08/23/19 1415 08/23/19 1626 08/23/19 2136 08/24/19 0735 08/24/19 1026  GLUCAP 78 115* 114* 124* 183*   D-Dimer No results for input(s): DDIMER in the last 72 hours. Hgb A1c No results for input(s): HGBA1C in the last 72 hours. Lipid Profile No results for input(s): CHOL, HDL, LDLCALC, TRIG, CHOLHDL, LDLDIRECT in the last 72 hours. Thyroid function studies No results for input(s): TSH, T4TOTAL, T3FREE, THYROIDAB in the last 72 hours.  Invalid input(s): FREET3 Anemia work up No results for input(s): VITAMINB12, FOLATE, FERRITIN, TIBC, IRON, RETICCTPCT in the last 72  hours. Microbiology Recent Results (from the past 240 hour(s))  SARS CORONAVIRUS 2 (TAT 6-24 HRS) Nasopharyngeal Nasopharyngeal Swab     Status: Abnormal   Collection Time: 08/21/19  3:15 PM   Specimen: Nasopharyngeal Swab  Result Value Ref Range Status   SARS Coronavirus 2 POSITIVE (A) NEGATIVE Final    Comment: RESULT CALLED TO, READ BACK BY AND VERIFIED WITH: RN E MCCULLEY @0208  08/22/19 BY S GEZAHEGN (NOTE) SARS-CoV-2 target nucleic acids are DETECTED. The SARS-CoV-2 RNA is generally detectable in upper and lower respiratory specimens during the acute phase of infection. Positive results are indicative of the presence of SARS-CoV-2 RNA. Clinical correlation with patient history and other diagnostic information is  necessary to determine patient infection status.  Positive results do not rule out bacterial infection or co-infection with other viruses.  The expected result is Negative. Fact Sheet for Patients: SugarRoll.be Fact Sheet for Healthcare Providers: https://www.woods-mathews.com/ This test is not yet approved or cleared by the [place] FDA and  has been authorized for detection and/or diagnosis of SARS-CoV-2 by FDA under an Emergency Use Authorization (EUA). This EUA will remain  in effect (meaning this test can be used) f or the duration of the COVID-19 declaration under Section 564(b)(1) of the Act, 21 U.S.C. section 360bbb-3(b)(1), unless the authorization is terminated or revoked sooner. Performed at [facility], [place] [address], [place], [place] [address]   Respiratory Panel by RT PCR (Flu A&B, Covid) - Nasopharyngeal Swab     Status: Abnormal   Collection Time: 08/21/19  8:15 PM   Specimen: Nasopharyngeal Swab  Result Value Ref Range Status   SARS Coronavirus 2 by RT PCR POSITIVE (A) NEGATIVE Final    Comment: RESULT CALLED TO, READ BACK BY AND VERIFIED WITH: ALYSA MCCULLEY RN 21072/18/21 HNM (NOTE) SARS-CoV-2  target nucleic acids are DETECTED. SARS-CoV-2 RNA is generally detectable in upper respiratory specimens  during the acute phase of infection. Positive results are indicative of the presence of the identified virus, but do not rule out bacterial infection or co-infection with other pathogens not detected by the test. Clinical correlation with patient history and other diagnostic information is necessary to determine patient infection status. The expected result is Negative. Fact Sheet for Patients:  PinkCheek.be Fact Sheet for Healthcare Providers: GravelBags.it This test is not yet approved or cleared by the [place] FDA and  has been authorized for detection and/or diagnosis of SARS-CoV-2 by FDA under an Emergency Use Authorization (EUA).  This EUA will remain in effect (meaning this test can be used) for  the duration of  the COVID-19 declaration under Section 564(b)(1) of the Act, 21 U.S.C. section 360bbb-3(b)(1), unless the authorization is terminated or revoked sooner.    Influenza A by PCR NEGATIVE NEGATIVE Final   Influenza B by PCR NEGATIVE NEGATIVE Final    Comment: (NOTE) The Xpert Xpress SARS-CoV-2/FLU/RSV assay is intended as an aid in  the diagnosis of influenza from Nasopharyngeal swab specimens and  should not be used as a sole basis for treatment. Nasal washings and  aspirates are unacceptable for Xpert Xpress SARS-CoV-2/FLU/RSV  testing. Fact Sheet for Patients: PinkCheek.be Fact Sheet for Healthcare Providers: GravelBags.it This test is not yet approved or cleared by the [place] FDA and  has been authorized for detection and/or diagnosis of SARS-CoV-2 by  FDA under an Emergency Use Authorization (EUA). This EUA will remain  in effect (meaning this test can be used) for the duration of the  Covid-19 declaration under Section 564(b)(1) of  the Act, 21  U.S.C. section 360bbb-3(b)(1), unless the authorization is  terminated or revoked. Performed at [facility], [address] [place],  [address]   MRSA PCR Screening     Status: None   Collection Time: 08/21/19 11:06 PM   Specimen: Nasal Mucosa; Nasopharyngeal  Result Value Ref Range Status   MRSA by PCR NEGATIVE NEGATIVE Final    Comment:        The GeneXpert MRSA Assay (FDA approved for NASAL specimens only), is one component of a comprehensive MRSA colonization surveillance program. It is not intended to diagnose MRSA infection nor to guide or monitor treatment for MRSA infections. Performed at [facility], [place],  [place], [place] [address]      Discharge Instructions:   Discharge Instructions    Diet - low sodium heart healthy   Complete by: As directed    Diet Carb Modified   Complete by: As directed    Increase activity slowly   Complete by: As directed      Allergies as of 08/24/2019   No Known Allergies     Medication List    STOP taking these medications   predniSONE 10 MG tablet Commonly known as: DELTASONE     TAKE these medications   furosemide 20 MG tablet Commonly known as: Lasix Take 1 tablet (20 mg total) by mouth daily as needed for edema.   Glucagon Emergency 1 MG Kit Inject 1 mg into the muscle once.   NovoLOG 100 UNIT/ML injection Generic drug: insulin aspart Inject 0-100 Units into the skin daily. Patient has insulin pump. Use as directed via insulin pen> What changed: additional instructions   tacrolimus 1 MG capsule Commonly known as: PROGRAF Take 2 mg by mouth 2 (two) times daily.         Time coordinating discharge: 28 minutes  Signed:  Genieve Ramaswamy  [facility] 08/24/2019, 1:17 PM

## 2019-08-24 NOTE — Plan of Care (Signed)
  Problem: Education: Goal: Knowledge of General Education information will improve Description: Including pain rating scale, medication(s)/side effects and non-pharmacologic comfort measures Outcome: Adequate for Discharge   

## 2019-08-25 LAB — TACROLIMUS LEVEL: Tacrolimus (FK506) - LabCorp: 3.7 ng/mL (ref 2.0–20.0)

## 2019-08-27 DIAGNOSIS — E1029 Type 1 diabetes mellitus with other diabetic kidney complication: Secondary | ICD-10-CM | POA: Insufficient documentation

## 2019-08-27 DIAGNOSIS — E1069 Type 1 diabetes mellitus with other specified complication: Secondary | ICD-10-CM | POA: Insufficient documentation

## 2019-09-01 LAB — BLOOD GAS, VENOUS
Acid-base deficit: 16.3 mmol/L — ABNORMAL HIGH (ref 0.0–2.0)
Bicarbonate: 12 mmol/L — ABNORMAL LOW (ref 20.0–28.0)
O2 Saturation: 20.7 %
Patient temperature: 37
pCO2, Ven: 36 mmHg — ABNORMAL LOW (ref 44.0–60.0)
pH, Ven: 7.13 — CL (ref 7.250–7.430)

## 2019-10-02 ENCOUNTER — Emergency Department
Admission: EM | Admit: 2019-10-02 | Discharge: 2019-10-02 | Disposition: A | Discharge status: Home / Self Care | Payer: Medicaid Other | Attending: Emergency Medicine | Admitting: Emergency Medicine

## 2019-10-02 ENCOUNTER — Encounter: Payer: Self-pay | Admitting: Emergency Medicine

## 2019-10-02 ENCOUNTER — Other Ambulatory Visit: Payer: Self-pay

## 2019-10-02 DIAGNOSIS — Z5321 Procedure and treatment not carried out due to patient leaving prior to being seen by health care provider: Secondary | ICD-10-CM | POA: Diagnosis not present

## 2019-10-02 DIAGNOSIS — R109 Unspecified abdominal pain: Secondary | ICD-10-CM | POA: Insufficient documentation

## 2019-10-02 LAB — CBC
HCT: 42.4 % (ref 36.0–46.0)
Hemoglobin: 14 g/dL (ref 12.0–15.0)
MCH: 28.7 pg (ref 26.0–34.0)
MCHC: 33 g/dL (ref 30.0–36.0)
MCV: 86.9 fL (ref 80.0–100.0)
Platelets: 366 10*3/uL (ref 150–400)
RBC: 4.88 MIL/uL (ref 3.87–5.11)
RDW: 13.3 % (ref 11.5–15.5)
WBC: 10.1 10*3/uL (ref 4.0–10.5)
nRBC: 0 % (ref 0.0–0.2)

## 2019-10-02 LAB — COMPREHENSIVE METABOLIC PANEL
ALT: 11 U/L (ref 0–44)
AST: 16 U/L (ref 15–41)
Albumin: 1.2 g/dL — ABNORMAL LOW (ref 3.5–5.0)
Alkaline Phosphatase: 85 U/L (ref 38–126)
Anion gap: 7 (ref 5–15)
BUN: 21 mg/dL — ABNORMAL HIGH (ref 6–20)
CO2: 24 mmol/L (ref 22–32)
Calcium: 8 mg/dL — ABNORMAL LOW (ref 8.9–10.3)
Chloride: 110 mmol/L (ref 98–111)
Creatinine, Ser: 0.95 mg/dL (ref 0.44–1.00)
GFR calc Af Amer: >60 mL/min (ref >60)
GFR calc non Af Amer: >60 mL/min (ref >60)
Glucose, Bld: 80 mg/dL (ref 70–99)
Potassium: 3.6 mmol/L (ref 3.5–5.1)
Sodium: 141 mmol/L (ref 135–145)
Total Bilirubin: 0.3 mg/dL (ref 0.3–1.2)
Total Protein: 5 g/dL — ABNORMAL LOW (ref 6.5–8.1)

## 2019-10-02 LAB — URINALYSIS, COMPLETE (UACMP) WITH MICROSCOPIC
Bacteria, UA: NONE SEEN
Bilirubin Urine: NEGATIVE
Glucose, UA: NEGATIVE mg/dL
Hgb urine dipstick: NEGATIVE
Ketones, ur: NEGATIVE mg/dL
Leukocytes,Ua: NEGATIVE
Nitrite: NEGATIVE
Protein, ur: >=300 mg/dL — AB
Specific Gravity, Urine: 1.02 (ref 1.005–1.030)
pH: 7 (ref 5.0–8.0)

## 2019-10-02 LAB — LIPASE, BLOOD: Lipase: 23 U/L (ref 11–51)

## 2019-10-02 LAB — POCT PREGNANCY, URINE: Preg Test, Ur: NEGATIVE

## 2019-10-02 NOTE — ED Triage Notes (Signed)
Patient reports she is on "fluid pills" for nephritis syndrome. States the pills have not been working and she is starting to notice significant fluid build-up. Patient states PCP told her to come to ED to get IV lasix.

## 2019-10-02 NOTE — ED Notes (Signed)
Pt called x3 without answer. Not visualized in waiting room.

## 2019-10-07 ENCOUNTER — Emergency Department: Payer: PPO

## 2019-10-07 ENCOUNTER — Emergency Department
Admission: EM | Admit: 2019-10-07 | Discharge: 2019-10-07 | Disposition: A | Discharge status: Home / Self Care | Payer: PPO | Attending: Emergency Medicine | Admitting: Emergency Medicine

## 2019-10-07 ENCOUNTER — Other Ambulatory Visit: Payer: Self-pay

## 2019-10-07 DIAGNOSIS — E109 Type 1 diabetes mellitus without complications: Secondary | ICD-10-CM | POA: Diagnosis not present

## 2019-10-07 DIAGNOSIS — J189 Pneumonia, unspecified organism: Secondary | ICD-10-CM | POA: Insufficient documentation

## 2019-10-07 DIAGNOSIS — R0602 Shortness of breath: Secondary | ICD-10-CM | POA: Insufficient documentation

## 2019-10-07 DIAGNOSIS — R112 Nausea with vomiting, unspecified: Secondary | ICD-10-CM

## 2019-10-07 DIAGNOSIS — R109 Unspecified abdominal pain: Secondary | ICD-10-CM | POA: Diagnosis not present

## 2019-10-07 LAB — URINALYSIS, COMPLETE (UACMP) WITH MICROSCOPIC
Bacteria, UA: NONE SEEN
Bilirubin Urine: NEGATIVE
Glucose, UA: >=500 mg/dL — AB
Hgb urine dipstick: NEGATIVE
Ketones, ur: 20 mg/dL — AB
Leukocytes,Ua: NEGATIVE
Nitrite: NEGATIVE
Protein, ur: >=300 mg/dL — AB
Specific Gravity, Urine: 1.045 — ABNORMAL HIGH (ref 1.005–1.030)
pH: 6 (ref 5.0–8.0)

## 2019-10-07 LAB — COMPREHENSIVE METABOLIC PANEL
ALT: 11 U/L (ref 0–44)
AST: 19 U/L (ref 15–41)
Albumin: 1.3 g/dL — ABNORMAL LOW (ref 3.5–5.0)
Alkaline Phosphatase: 79 U/L (ref 38–126)
Anion gap: 9 (ref 5–15)
BUN: 18 mg/dL (ref 6–20)
CO2: 20 mmol/L — ABNORMAL LOW (ref 22–32)
Calcium: 7.8 mg/dL — ABNORMAL LOW (ref 8.9–10.3)
Chloride: 109 mmol/L (ref 98–111)
Creatinine, Ser: 0.86 mg/dL (ref 0.44–1.00)
GFR calc Af Amer: >60 mL/min (ref >60)
GFR calc non Af Amer: >60 mL/min (ref >60)
Glucose, Bld: 105 mg/dL — ABNORMAL HIGH (ref 70–99)
Potassium: 3.5 mmol/L (ref 3.5–5.1)
Sodium: 138 mmol/L (ref 135–145)
Total Bilirubin: 0.6 mg/dL (ref 0.3–1.2)
Total Protein: 4.9 g/dL — ABNORMAL LOW (ref 6.5–8.1)

## 2019-10-07 LAB — GLUCOSE, CAPILLARY
Glucose-Capillary: 103 mg/dL — ABNORMAL HIGH (ref 70–99)
Glucose-Capillary: 248 mg/dL — ABNORMAL HIGH (ref 70–99)

## 2019-10-07 LAB — CBC
HCT: 40.2 % (ref 36.0–46.0)
Hemoglobin: 13.7 g/dL (ref 12.0–15.0)
MCH: 29.2 pg (ref 26.0–34.0)
MCHC: 34.1 g/dL (ref 30.0–36.0)
MCV: 85.7 fL (ref 80.0–100.0)
Platelets: 320 10*3/uL (ref 150–400)
RBC: 4.69 MIL/uL (ref 3.87–5.11)
RDW: 13.2 % (ref 11.5–15.5)
WBC: 23.8 10*3/uL — ABNORMAL HIGH (ref 4.0–10.5)
nRBC: 0 % (ref 0.0–0.2)

## 2019-10-07 LAB — HCG, QUANTITATIVE, PREGNANCY: hCG, Beta Chain, Quant, S: 1 m[IU]/mL (ref <5)

## 2019-10-07 LAB — LACTIC ACID, PLASMA
Lactic Acid, Venous: 0.9 mmol/L (ref 0.5–1.9)
Lactic Acid, Venous: 0.9 mmol/L (ref 0.5–1.9)

## 2019-10-07 LAB — LIPASE, BLOOD: Lipase: 32 U/L (ref 11–51)

## 2019-10-07 LAB — BRAIN NATRIURETIC PEPTIDE: B Natriuretic Peptide: 61 pg/mL (ref 0.0–100.0)

## 2019-10-07 MED ORDER — CEFPODOXIME PROXETIL 200 MG PO TABS: 200.0000 mg | ORAL_TABLET | Freq: Two times a day (BID) | ORAL | 0 refills | Status: DC

## 2019-10-07 MED ORDER — IOHEXOL 300 MG/ML  SOLN
75.0000 mL | Freq: Once | INTRAMUSCULAR | Status: AC | PRN
  Administered 2019-10-07: 75 mL via INTRAVENOUS

## 2019-10-07 MED ORDER — MORPHINE SULFATE (PF) 4 MG/ML IV SOLN
4.0000 mg | Freq: Once | INTRAVENOUS | Status: AC
  Administered 2019-10-07: 4 mg via INTRAVENOUS
  Filled 2019-10-07: qty 1

## 2019-10-07 MED ORDER — IOHEXOL 9 MG/ML PO SOLN
500.0000 mL | ORAL | Status: AC
  Administered 2019-10-07 (×2): 500 mL via ORAL

## 2019-10-07 MED ORDER — AZITHROMYCIN 250 MG PO TABS: ORAL_TABLET | ORAL | 0 refills | Status: DC

## 2019-10-07 MED ORDER — ONDANSETRON HCL 4 MG/2ML IJ SOLN
4.0000 mg | Freq: Once | INTRAMUSCULAR | Status: AC
  Administered 2019-10-07: 4 mg via INTRAVENOUS
  Filled 2019-10-07: qty 2

## 2019-10-07 MED ORDER — ONDANSETRON 4 MG PO TBDP
ORAL_TABLET | ORAL | Status: AC
  Filled 2019-10-07: qty 1

## 2019-10-07 MED ORDER — ONDANSETRON 4 MG PO TBDP: 4.0000 mg | ORAL_TABLET | Freq: Four times a day (QID) | ORAL | 0 refills | Status: DC | PRN

## 2019-10-07 MED ORDER — AZITHROMYCIN 500 MG PO TABS
500.0000 mg | ORAL_TABLET | Freq: Once | ORAL | Status: AC
  Administered 2019-10-07: 500 mg via ORAL
  Filled 2019-10-07: qty 1

## 2019-10-07 MED ORDER — ONDANSETRON 4 MG PO TBDP
4.0000 mg | ORAL_TABLET | Freq: Once | ORAL | Status: AC | PRN
  Administered 2019-10-07: 4 mg via ORAL

## 2019-10-07 NOTE — ED Triage Notes (Signed)
Pt states N&V with blood and "fluid build up around my lungs." states began last night. A&O, in wheelchair. Pt hyperventilating in triage. States takes a fluid pill but states it hasn't been working. C/o of abd swelling.

## 2019-10-07 NOTE — ED Provider Notes (Signed)
[facility] Emergency Department Provider Note   ____________________________________________   First MD Initiated Contact with Patient 10/07/19 1627     (approximate)  I have reviewed the triage vital signs and the nursing notes.   HISTORY  Chief Complaint Emesis    HPI Catherine [name] is a 19 y.o. female history of diabetes and nephrotic syndrome  Patient seen by her doctor about 2 weeks ago for feeling like she had too much fluid on had her increased Lasix dosing.  Was not started on steroids because of a history of DKA.  She also has an insulin pump which she has been using, her blood sugars have been well controlled she reports they have been in the low 100s here most recently in she is not had any high readings.  She started developing some right back pain rather sharp in intensity in her right bank right flank region.  Associate with nausea and vomiting.  First she was vomiting up just food and whenever she had eaten, but after vomiting a few times started no small amounts of streaky blood in it.  This seems to have improved now  No diarrhea.  Normal bowel movements.  Reports her back hurts and also said the right side of her abdomen.  Been having a little bit of shortness of breath in the evenings, but this is been steady for about 2 weeks now and seems actually like it is getting a little bit better.  No chest pain.  No shortness of breath at present  No recent Covid exposure, but had Covid middle of February for which she is recovering   Past Medical History:  Diagnosis Date  . Diabetes mellitus without complication ([place])   . 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 Nephritic syndrome   . Nephrotic syndrome 08/23/2019    Patient Active Problem List   Diagnosis Date Noted  . 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 Proteinuria 08/23/2019  . Nephrotic syndrome 08/23/2019  . DM (diabetes mellitus) type I uncontrolled with renal manifestation  ([place]) 08/23/2019  . AKI (acute kidney injury) ([place]) 08/21/2019  . DKA (diabetic ketoacidoses) ([place]) 07/28/2019  . Nephrotic syndrome due to type 1 diabetes mellitus ([place])   . Hyponatremia   . Acidosis     History reviewed. No pertinent surgical history.  Prior to Admission medications   Medication Sig Start Date End Date Taking? Authorizing Provider  Glucagon, rDNA, (GLUCAGON EMERGENCY) 1 MG KIT Inject 1 mg into the muscle once. 02/27/19  Yes [provider]  NOVOLOG 100 UNIT/ML injection Inject 0-100 Units into the skin daily. Patient has insulin pump. Use as directed via insulin pen> Patient taking differently: Inject 67 Units into the skin daily. Patient has insulin pump. 08/24/19 10/07/19 Yes Jennye Boroughs, MD  tacrolimus (PROGRAF) 1 MG capsule Take 2 mg by mouth 2 (two) times daily.   Yes [provider]  azithromycin (ZITHROMAX Z-PAK) 250 MG tablet 1 tablet by mouth daily 10/07/19   Delman Kitten, MD  cefpodoxime (VANTIN) 200 MG tablet Take 1 tablet (200 mg total) by mouth 2 (two) times daily. 10/07/19   Delman Kitten, MD  furosemide (LASIX) 20 MG tablet Take 1 tablet (20 mg total) by mouth daily as needed for edema. 08/16/19 08/15/20  Lavonia Drafts, MD  ondansetron (ZOFRAN ODT) 4 MG disintegrating tablet Take 1 tablet (4 mg total) by mouth every 6 (six) hours as needed for nausea or vomiting. 10/07/19   Delman Kitten, MD  Allergies Patient has no known allergies.  History reviewed. No pertinent family history.  Social History Social History   Tobacco Use  . Smoking status: Never Smoker  . Smokeless tobacco: Never Used  Substance Use Topics  . Alcohol use: Not Currently  . Drug use: Not Currently    Review of Systems Constitutional: No fever/chills Eyes: No visual changes. ENT: No sore throat. Cardiovascular: Denies chest pain. Respiratory: See HPI Gastrointestinal: See HPI Genitourinary: Negative for dysuria.  Denies pregnancy. Musculoskeletal: Negative for  back pain on left but reports significant discomfort over the right. Skin: Negative for rash. Neurological: Negative for headaches, areas of focal weakness or numbness.    ____________________________________________   PHYSICAL EXAM:  VITAL SIGNS: ED Triage Vitals  Enc Vitals Group     BP 10/07/19 1504 114/74     Pulse Rate 10/07/19 1504 (!) 102     Resp 10/07/19 1504 (!) 24     Temp 10/07/19 1504 98.7 F (37.1 C)     Temp Source 10/07/19 1504 Oral     SpO2 10/07/19 1504 100 %     Weight 10/07/19 1505 120 lb (54.4 kg)     Height 10/07/19 1505 4' 11"  (1.499 m)     Head Circumference --      Peak Flow --      Pain Score 10/07/19 1505 8     Pain Loc --      Pain Edu? --      Excl. in [place]? --     Constitutional: Alert and oriented. Well appearing and in no acute distress.  She is pleasant. Eyes: Conjunctivae are normal. Head: Atraumatic. Nose: No congestion/rhinnorhea. Mouth/Throat: Mucous membranes are moist. Neck: No stridor.  Cardiovascular: Minimally tachycardic rate, regular rhythm. Grossly normal heart sounds.  Good peripheral circulation. Respiratory: Normal respiratory effort.  No retractions. Lungs CTAB. Gastrointestinal: Soft and nontender over the left, but reports moderate discomfort right mid and lower abdomen without obvious focality.  Minimal left-sided CVA tenderness, reports severe right-sided CVA tenderness to percussion. No distention.  Insulin pump appears to be functioning.   Musculoskeletal: No lower extremity tenderness trace edema. Neurologic:  Normal speech and language. No gross focal neurologic deficits are appreciated.  Skin:  Skin is warm, dry and intact. No rash noted. Psychiatric: Mood and affect are normal. Speech and behavior are normal.  ____________________________________________   LABS (all labs ordered are listed, but only abnormal results are displayed)  Labs Reviewed  COMPREHENSIVE METABOLIC PANEL - Abnormal; Notable for the  following components:      Result Value   CO2 20 (*)    Glucose, Bld 105 (*)    Calcium 7.8 (*)    Total Protein 4.9 (*)    Albumin 1.3 (*)    All other components within normal limits  CBC - Abnormal; Notable for the following components:   WBC 23.8 (*)    All other components within normal limits  URINALYSIS, COMPLETE (UACMP) WITH MICROSCOPIC - Abnormal; Notable for the following components:   Color, Urine YELLOW (*)    APPearance CLEAR (*)    Specific Gravity, Urine 1.045 (*)    Glucose, UA >=500 (*)    Ketones, ur 20 (*)    Protein, ur >=300 (*)    All other components within normal limits  GLUCOSE, CAPILLARY - Abnormal; Notable for the following components:   Glucose-Capillary 103 (*)    All other components within normal limits  GLUCOSE, CAPILLARY - Abnormal; Notable for the following  components:   Glucose-Capillary 248 (*)    All other components within normal limits  CULTURE, BLOOD (ROUTINE X 2)  CULTURE, BLOOD (ROUTINE X 2)  URINE CULTURE  LIPASE, BLOOD  LACTIC ACID, PLASMA  LACTIC ACID, PLASMA  BRAIN NATRIURETIC PEPTIDE  HCG, QUANTITATIVE, PREGNANCY  CBG MONITORING, ED  CBG MONITORING, ED  CBG MONITORING, ED  CBG MONITORING, ED  CBG MONITORING, ED  CBG MONITORING, ED  ____________________________________________  RADIOLOGY  DG Chest 2 View  Result Date: 10/07/2019 CLINICAL DATA:  19 year old female with shortness of breath. EXAM: CHEST - 2 VIEW COMPARISON:  Chest radiograph dated 07/29/2019. FINDINGS: Shallow inspiration. No focal consolidation, pleural effusion, pneumothorax. The cardiac silhouette is within normal limits. No acute osseous pathology. IMPRESSION: No acute cardiopulmonary process. Electronically Signed   By: Anner Crete M.D.   On: 10/07/2019 17:37   CT ABDOMEN PELVIS W CONTRAST  Result Date: 10/07/2019 CLINICAL DATA:  Right abdominal pain, flank pain, leukocytosis. EXAM: CT ABDOMEN AND PELVIS WITH CONTRAST TECHNIQUE: Multidetector CT  imaging of the abdomen and pelvis was performed using the standard protocol following bolus administration of intravenous contrast. CONTRAST:  80m OMNIPAQUE IOHEXOL 300 MG/ML  SOLN COMPARISON:  None. FINDINGS: Lower chest: Bibasilar opacities, right greater than left. This could reflect atelectasis or early infiltrate/pneumonia. Hepatobiliary: No focal hepatic abnormality. Gallbladder unremarkable. Pancreas: No focal abnormality or ductal dilatation. Spleen: No focal abnormality.  Normal size. Adrenals/Urinary Tract: No adrenal abnormality. No focal renal abnormality. No stones or hydronephrosis. Urinary bladder is unremarkable. Stomach/Bowel: Stomach, large and small bowel grossly unremarkable. Normal appendix. Vascular/Lymphatic: Aorta normal caliber. There are mildly enlarged retroperitoneal lymph nodes. Index left periaortic lymph node has a short axis diameter of 12 mm. Other similarly sized and smaller lymph nodes throughout the retroperitoneum. Borderline sized mesenteric lymph nodes also noted. Reproductive: Uterus and adnexa unremarkable.  No mass. Other: Small amount of free fluid in the pelvis.  No free air. Musculoskeletal: No acute bony abnormality. IMPRESSION: Bibasilar airspace opacities, right greater than left. This could reflect atelectasis or early infiltrate/pneumonia. Normal appendix. No CT evidence for pyelonephritis. Mildly enlarged retroperitoneal lymph nodes. Borderline mesenteric lymph nodes. These could be reactive. Recommend follow-up to exclude lymphoproliferative disorder in 3-6 months. Electronically Signed   By: KRolm BaptiseM.D.   On: 10/07/2019 18:55     Imaging studies reviewed, abdomen pelvis demonstrates bibasilar airspace opacities.  Unclear but may be atelectasis or early infiltrate pneumonia.  CT scan does show enlarged lymph nodes, possibly reactive.  Message sent to Dr. KJuleen Chinato request follow-up on this through him who serves as her nephrologist, he can follow-up  on today's visit and imaging ____________________________________________   PROCEDURES  Procedure(s) performed: None  Procedures  Critical Care performed: No  ____________________________________________   INITIAL IMPRESSION / ASSESSMENT AND PLAN / ED COURSE  Pertinent labs & imaging results that were available during my care of the patient were reviewed by me and considered in my medical decision making (see chart for details).   Discussed case with Dr. KJuleen Chinaher nephrologist.  He saw her about 2 weeks ago.  Reports that at that time she had increased diuretic but recommends against use of steroids for her due to history of DKA.  Overall she was having some shortness of breath at that time and the patient reports that the shortness of breath seems to be steady if not slightly improved from them.  She does appear not to have any acute dyspnea at this time.  Reassuring cardiac  and pulmonary exam.  She does however have acute right flank pain associate with vomiting.  She reports some blood in her vomit, and based on her description I suspect is likely some sort of a small esophageal tear but no signs or symptoms suggest a [place] or complete rupture.  I am most concerned about the possibly of her right flank pain leukocytosis possibly representing infection sepsis, pyelonephritis cholecystitis appendicitis colitis etc. all differential  Proceed with CT imaging.  Pain control.  Discussed with nephrology, okay to obtain CT imaging with contrast  Clinical Course as of Oct 07 2131  Tue Oct 07, 2019  2117 Consult placed with hospitalist, reviewed the case with Dr. Posey Pronto of the hospitalist service who after reviewing case recommends outpatient treatment with oral cephalosporin and azithromycin with close physician follow-up.  Discussed this with the patient and she is agreeable with this plan.  Her vital signs are currently normal work of breathing normal.  Pain controlled.  She is  understanding agreeable with plan   [MQ]    Clinical Course User Index [MQ] Delman Kitten, MD   ----------------------------------------- 9:35 PM on 10/07/2019 -----------------------------------------  Patient feels well, ready for discharge.  Plan in place to follow-up closely with Dr. Juleen China regarding her visit today as well as follow-up on CT findings and future course.  ____________________________________________   FINAL CLINICAL IMPRESSION(S) / ED DIAGNOSES  Final diagnoses:  Community acquired pneumonia, unspecified laterality  Right flank pain  Non-intractable vomiting with nausea, unspecified vomiting type        Note:  This document was prepared using Dragon voice recognition software and may include unintentional dictation errors       Delman Kitten, MD 10/07/19 2141

## 2019-10-08 ENCOUNTER — Encounter: Payer: Self-pay | Admitting: Internal Medicine

## 2019-10-09 LAB — URINE CULTURE: Culture: 10000 — AB

## 2019-10-12 LAB — CULTURE, BLOOD (ROUTINE X 2)
Culture: NO GROWTH
Culture: NO GROWTH

## 2020-04-18 ENCOUNTER — Inpatient Hospital Stay
Admission: EM | Admit: 2020-04-18 | Discharge: 2020-04-20 | DRG: 638 | Disposition: A | Discharge status: Home / Self Care | Payer: Medicaid Other | Attending: Obstetrics and Gynecology | Admitting: Obstetrics and Gynecology

## 2020-04-18 ENCOUNTER — Other Ambulatory Visit: Payer: Self-pay

## 2020-04-18 DIAGNOSIS — Z7952 Long term (current) use of systemic steroids: Secondary | ICD-10-CM | POA: Diagnosis not present

## 2020-04-18 DIAGNOSIS — N049 Nephrotic syndrome with unspecified morphologic changes: Secondary | ICD-10-CM | POA: Diagnosis not present

## 2020-04-18 DIAGNOSIS — E1110 Type 2 diabetes mellitus with ketoacidosis without coma: Secondary | ICD-10-CM | POA: Diagnosis present

## 2020-04-18 DIAGNOSIS — E1010 Type 1 diabetes mellitus with ketoacidosis without coma: Secondary | ICD-10-CM | POA: Diagnosis present

## 2020-04-18 DIAGNOSIS — Z794 Long term (current) use of insulin: Secondary | ICD-10-CM | POA: Diagnosis not present

## 2020-04-18 DIAGNOSIS — N040 Nephrotic syndrome with minor glomerular abnormality: Secondary | ICD-10-CM | POA: Diagnosis present

## 2020-04-18 DIAGNOSIS — Z20822 Contact with and (suspected) exposure to covid-19: Secondary | ICD-10-CM | POA: Diagnosis present

## 2020-04-18 DIAGNOSIS — Z79899 Other long term (current) drug therapy: Secondary | ICD-10-CM

## 2020-04-18 LAB — GLUCOSE, CAPILLARY
Glucose-Capillary: 115 mg/dL — ABNORMAL HIGH (ref 70–99)
Glucose-Capillary: 135 mg/dL — ABNORMAL HIGH (ref 70–99)
Glucose-Capillary: 138 mg/dL — ABNORMAL HIGH (ref 70–99)
Glucose-Capillary: 149 mg/dL — ABNORMAL HIGH (ref 70–99)
Glucose-Capillary: 150 mg/dL — ABNORMAL HIGH (ref 70–99)
Glucose-Capillary: 156 mg/dL — ABNORMAL HIGH (ref 70–99)
Glucose-Capillary: 165 mg/dL — ABNORMAL HIGH (ref 70–99)
Glucose-Capillary: 179 mg/dL — ABNORMAL HIGH (ref 70–99)
Glucose-Capillary: 181 mg/dL — ABNORMAL HIGH (ref 70–99)
Glucose-Capillary: 196 mg/dL — ABNORMAL HIGH (ref 70–99)
Glucose-Capillary: 217 mg/dL — ABNORMAL HIGH (ref 70–99)
Glucose-Capillary: 250 mg/dL — ABNORMAL HIGH (ref 70–99)
Glucose-Capillary: 255 mg/dL — ABNORMAL HIGH (ref 70–99)
Glucose-Capillary: 266 mg/dL — ABNORMAL HIGH (ref 70–99)
Glucose-Capillary: 272 mg/dL — ABNORMAL HIGH (ref 70–99)
Glucose-Capillary: 338 mg/dL — ABNORMAL HIGH (ref 70–99)

## 2020-04-18 LAB — CBC
HCT: 45.9 % (ref 36.0–46.0)
Hemoglobin: 15.7 g/dL — ABNORMAL HIGH (ref 12.0–15.0)
MCH: 28.4 pg (ref 26.0–34.0)
MCHC: 34.2 g/dL (ref 30.0–36.0)
MCV: 83.2 fL (ref 80.0–100.0)
Platelets: 401 10*3/uL — ABNORMAL HIGH (ref 150–400)
RBC: 5.52 MIL/uL — ABNORMAL HIGH (ref 3.87–5.11)
RDW: 13.2 % (ref 11.5–15.5)
WBC: 12.1 10*3/uL — ABNORMAL HIGH (ref 4.0–10.5)
nRBC: 0 % (ref 0.0–0.2)

## 2020-04-18 LAB — URINALYSIS, COMPLETE (UACMP) WITH MICROSCOPIC
Bilirubin Urine: NEGATIVE
Glucose, UA: >=500 mg/dL — AB
Hgb urine dipstick: NEGATIVE
Ketones, ur: 80 mg/dL — AB
Leukocytes,Ua: NEGATIVE
Nitrite: NEGATIVE
Protein, ur: 100 mg/dL — AB
Specific Gravity, Urine: 1.015 (ref 1.005–1.030)
pH: 6 (ref 5.0–8.0)

## 2020-04-18 LAB — BASIC METABOLIC PANEL
Anion gap: 21 — ABNORMAL HIGH (ref 5–15)
Anion gap: 14 (ref 5–15)
Anion gap: 7 (ref 5–15)
Anion gap: 6 (ref 5–15)
Anion gap: 8 (ref 5–15)
BUN: 26 mg/dL — ABNORMAL HIGH (ref 6–20)
BUN: 19 mg/dL (ref 6–20)
BUN: 14 mg/dL (ref 6–20)
BUN: 12 mg/dL (ref 6–20)
BUN: 12 mg/dL (ref 6–20)
CO2: 16 mmol/L — ABNORMAL LOW (ref 22–32)
CO2: 14 mmol/L — ABNORMAL LOW (ref 22–32)
CO2: 19 mmol/L — ABNORMAL LOW (ref 22–32)
CO2: 20 mmol/L — ABNORMAL LOW (ref 22–32)
CO2: 18 mmol/L — ABNORMAL LOW (ref 22–32)
Calcium: 9.8 mg/dL (ref 8.9–10.3)
Calcium: 8.1 mg/dL — ABNORMAL LOW (ref 8.9–10.3)
Calcium: 8.1 mg/dL — ABNORMAL LOW (ref 8.9–10.3)
Calcium: 8.3 mg/dL — ABNORMAL LOW (ref 8.9–10.3)
Calcium: 8.1 mg/dL — ABNORMAL LOW (ref 8.9–10.3)
Chloride: 99 mmol/L (ref 98–111)
Chloride: 111 mmol/L (ref 98–111)
Chloride: 111 mmol/L (ref 98–111)
Chloride: 113 mmol/L — ABNORMAL HIGH (ref 98–111)
Chloride: 110 mmol/L (ref 98–111)
Creatinine, Ser: 1.19 mg/dL — ABNORMAL HIGH (ref 0.44–1.00)
Creatinine, Ser: 0.76 mg/dL (ref 0.44–1.00)
Creatinine, Ser: 0.67 mg/dL (ref 0.44–1.00)
Creatinine, Ser: 0.69 mg/dL (ref 0.44–1.00)
Creatinine, Ser: 0.8 mg/dL (ref 0.44–1.00)
GFR, Estimated: >60 mL/min (ref >60)
GFR, Estimated: >60 mL/min (ref >60)
GFR, Estimated: >60 mL/min (ref >60)
GFR, Estimated: >60 mL/min (ref >60)
GFR, Estimated: >60 mL/min (ref >60)
Glucose, Bld: 281 mg/dL — ABNORMAL HIGH (ref 70–99)
Glucose, Bld: 212 mg/dL — ABNORMAL HIGH (ref 70–99)
Glucose, Bld: 194 mg/dL — ABNORMAL HIGH (ref 70–99)
Glucose, Bld: 143 mg/dL — ABNORMAL HIGH (ref 70–99)
Glucose, Bld: 350 mg/dL — ABNORMAL HIGH (ref 70–99)
Potassium: 3.4 mmol/L — ABNORMAL LOW (ref 3.5–5.1)
Potassium: 4 mmol/L (ref 3.5–5.1)
Potassium: 3.8 mmol/L (ref 3.5–5.1)
Potassium: 3.8 mmol/L (ref 3.5–5.1)
Potassium: 3.6 mmol/L (ref 3.5–5.1)
Sodium: 136 mmol/L (ref 135–145)
Sodium: 139 mmol/L (ref 135–145)
Sodium: 137 mmol/L (ref 135–145)
Sodium: 139 mmol/L (ref 135–145)
Sodium: 136 mmol/L (ref 135–145)

## 2020-04-18 LAB — POC URINE PREG, ED: Preg Test, Ur: NEGATIVE

## 2020-04-18 LAB — BETA-HYDROXYBUTYRIC ACID
Beta-Hydroxybutyric Acid: 6.26 mmol/L — ABNORMAL HIGH (ref 0.05–0.27)
Beta-Hydroxybutyric Acid: 1.71 mmol/L — ABNORMAL HIGH (ref 0.05–0.27)

## 2020-04-18 LAB — RESPIRATORY PANEL BY RT PCR (FLU A&B, COVID)
Influenza A by PCR: NEGATIVE
Influenza B by PCR: NEGATIVE
SARS Coronavirus 2 by RT PCR: NEGATIVE

## 2020-04-18 MED ORDER — INSULIN REGULAR(HUMAN) IN NACL 100-0.9 UT/100ML-% IV SOLN: INTRAVENOUS | Status: DC

## 2020-04-18 MED ORDER — TACROLIMUS 1 MG PO CAPS
2.0000 mg | ORAL_CAPSULE | Freq: Two times a day (BID) | ORAL | Status: DC
  Administered 2020-04-18 – 2020-04-20 (×5): 2 mg via ORAL
  Filled 2020-04-18 (×7): qty 2

## 2020-04-18 MED ORDER — SODIUM CHLORIDE 0.9 % IV BOLUS
1000.0000 mL | Freq: Once | INTRAVENOUS | Status: AC
  Administered 2020-04-18: 1000 mL via INTRAVENOUS

## 2020-04-18 MED ORDER — POTASSIUM CHLORIDE 10 MEQ/100ML IV SOLN: 10.0000 meq | INTRAVENOUS | Status: DC

## 2020-04-18 MED ORDER — DEXTROSE IN LACTATED RINGERS 5 % IV SOLN: INTRAVENOUS | Status: DC

## 2020-04-18 MED ORDER — DEXTROSE 50 % IV SOLN: 0.0000 mL | INTRAVENOUS | Status: DC | PRN

## 2020-04-18 MED ORDER — ONDANSETRON HCL 4 MG/2ML IJ SOLN
4.0000 mg | Freq: Once | INTRAMUSCULAR | Status: AC
  Administered 2020-04-18: 4 mg via INTRAVENOUS
  Filled 2020-04-18: qty 2

## 2020-04-18 MED ORDER — INSULIN GLARGINE 100 UNIT/ML ~~LOC~~ SOLN
20.0000 [IU] | Freq: Every day | SUBCUTANEOUS | Status: DC
  Administered 2020-04-18: 20 [IU] via SUBCUTANEOUS
  Filled 2020-04-18 (×2): qty 0.2

## 2020-04-18 MED ORDER — INSULIN GLARGINE 100 UNITS/ML SOLOSTAR PEN
20.0000 [IU] | PEN_INJECTOR | Freq: Every day | SUBCUTANEOUS | Status: DC
  Filled 2020-04-18: qty 3

## 2020-04-18 MED ORDER — LACTATED RINGERS IV BOLUS: 20.0000 mL/kg | Freq: Once | INTRAVENOUS | Status: DC

## 2020-04-18 MED ORDER — LACTATED RINGERS IV SOLN: INTRAVENOUS | Status: DC

## 2020-04-18 MED ORDER — PANTOPRAZOLE SODIUM 40 MG IV SOLR
40.0000 mg | Freq: Every day | INTRAVENOUS | Status: DC
  Administered 2020-04-18 – 2020-04-20 (×3): 40 mg via INTRAVENOUS
  Filled 2020-04-18 (×3): qty 40

## 2020-04-18 MED ORDER — INSULIN REGULAR(HUMAN) IN NACL 100-0.9 UT/100ML-% IV SOLN
INTRAVENOUS | Status: DC
  Administered 2020-04-18: 3.8 [IU]/h via INTRAVENOUS
  Filled 2020-04-18: qty 100

## 2020-04-18 MED ORDER — LACTATED RINGERS IV BOLUS
1000.0000 mL | Freq: Once | INTRAVENOUS | Status: AC
  Administered 2020-04-18: 1000 mL via INTRAVENOUS

## 2020-04-18 MED ORDER — POTASSIUM CHLORIDE 10 MEQ/100ML IV SOLN
10.0000 meq | INTRAVENOUS | Status: AC
  Administered 2020-04-18 (×4): 10 meq via INTRAVENOUS
  Filled 2020-04-18 (×4): qty 100

## 2020-04-18 MED ORDER — ENOXAPARIN SODIUM 40 MG/0.4ML ~~LOC~~ SOLN
40.0000 mg | SUBCUTANEOUS | Status: DC
  Administered 2020-04-18 – 2020-04-19 (×2): 40 mg via SUBCUTANEOUS
  Filled 2020-04-18 (×3): qty 0.4

## 2020-04-18 NOTE — ED Provider Notes (Signed)
[facility] Emergency Department Provider Note    First MD Initiated Contact with Patient 04/18/20 [phone]     (approximate)  I have reviewed the triage vital signs and the nursing notes.   HISTORY  Chief Complaint high blood sugar    HPI Catherine [name] is a 19 y.o. female with the below listed past medical history presents to the ER for evaluation of generalized malaise feeling winded having nausea decreased p.o. intake.  Also having decreased urine output for the past several days.  She is insulin-dependent diabetic.  Has been taking her insulin.  States that 3 to 4 days ago she did not have any ketones in her urine but it started to feel worse.  No measured fevers.  Denies any dysuria or flank pain.  Does feel similar to previous episodes of DKA.    Past Medical History:  Diagnosis Date  . Diabetes mellitus without complication ([place])   . 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 Nephritic syndrome   . Nephrotic syndrome 08/23/2019   No family history on file. No past surgical history on file. Patient Active Problem List   Diagnosis Date Noted  . 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 Proteinuria 08/23/2019  . Nephrotic syndrome 08/23/2019  . DM (diabetes mellitus) type I uncontrolled with renal manifestation ([place]) 08/23/2019  . AKI (acute kidney injury) ([place]) 08/21/2019  . DKA (diabetic ketoacidoses) 07/28/2019  . Nephrotic syndrome due to type 1 diabetes mellitus ([place])   . Hyponatremia   . Acidosis       Prior to Admission medications   Medication Sig Start Date End Date Taking? Authorizing Provider  azithromycin (ZITHROMAX Z-PAK) 250 MG tablet 1 tablet by mouth daily 10/07/19   Delman Kitten, MD  cefpodoxime (VANTIN) 200 MG tablet Take 1 tablet (200 mg total) by mouth 2 (two) times daily. 10/07/19   Delman Kitten, MD  furosemide (LASIX) 20 MG tablet Take 1 tablet (20 mg total) by mouth daily as needed for edema. 08/16/19 08/15/20  Lavonia Drafts, MD    Glucagon, rDNA, (GLUCAGON EMERGENCY) 1 MG KIT Inject 1 mg into the muscle once. 02/27/19   [provider]  LANTUS 100 UNIT/ML injection Inject 30 Units into the skin daily. 01/27/20   [provider]  NOVOLOG 100 UNIT/ML injection Inject 0-100 Units into the skin daily. Patient has insulin pump. Use as directed via insulin pen> Patient taking differently: Inject 67 Units into the skin daily. Patient has insulin pump. 08/24/19 10/07/19  Jennye Boroughs, MD  ondansetron (ZOFRAN ODT) 4 MG disintegrating tablet Take 1 tablet (4 mg total) by mouth every 6 (six) hours as needed for nausea or vomiting. 10/07/19   Delman Kitten, MD  predniSONE (DELTASONE) 20 MG tablet Take 20 mg by mouth 2 (two) times daily. 01/27/20   [provider]  sertraline (ZOLOFT) 50 MG tablet Take 50 mg by mouth daily. 11/24/19   [provider]  tacrolimus (PROGRAF) 1 MG capsule Take 2 mg by mouth 2 (two) times daily.    [provider]    Allergies Patient has no known allergies.    Social History Social History   Tobacco Use  . Smoking status: Never Smoker  . Smokeless tobacco: Never Used  Vaping Use  . Vaping Use: Never used  Substance Use Topics  . Alcohol use: Not Currently  . Drug use: Not Currently    Review of Systems Patient denies headaches, rhinorrhea, blurry vision, numbness, shortness of breath, chest pain,  edema, cough, abdominal pain, nausea, vomiting, diarrhea, dysuria, fevers, rashes or hallucinations unless otherwise stated above in HPI. ____________________________________________   PHYSICAL EXAM:  VITAL SIGNS: Vitals:   04/18/20 0802 04/18/20 0830  BP:  (!) 129/93  Pulse:  78  Resp:  20  Temp: (!) 97.4 F (36.3 C)   SpO2:  99%    Constitutional: Alert and oriented.  Eyes: Conjunctivae are normal.  Head: Atraumatic. Nose: No congestion/rhinnorhea. Mouth/Throat: Mucous membranes are moist.   Neck: No stridor. Painless ROM.  Cardiovascular:  Normal rate, regular rhythm. Grossly normal heart sounds.  Good peripheral circulation. Respiratory: Normal respiratory effort.  No retractions. Lungs CTAB. Gastrointestinal: Soft and nontender. No distention. No abdominal bruits. No CVA tenderness. Genitourinary:  Musculoskeletal: No lower extremity tenderness nor edema.  No joint effusions. Neurologic:  Normal speech and language. No gross focal neurologic deficits are appreciated. No facial droop Skin:  Skin is warm, dry and intact. No rash noted. Psychiatric: Mood and affect are normal. Speech and behavior are normal.  ____________________________________________   LABS (all labs ordered are listed, but only abnormal results are displayed)  Results for orders placed or performed during the hospital encounter of 04/18/20 (from the past 24 hour(s))  Basic metabolic panel     Status: Abnormal   Collection Time: 04/18/20  7:56 AM  Result Value Ref Range   Sodium 136 135 - 145 mmol/L   Potassium 3.4 (L) 3.5 - 5.1 mmol/L   Chloride 99 98 - 111 mmol/L   CO2 16 (L) 22 - 32 mmol/L   Glucose, Bld 281 (H) 70 - 99 mg/dL   BUN 26 (H) 6 - 20 mg/dL   Creatinine, Ser 1.19 (H) 0.44 - 1.00 mg/dL   Calcium 9.8 8.9 - 10.3 mg/dL   GFR, Estimated >60 >60 mL/min   Anion gap 21 (H) 5 - 15  CBC     Status: Abnormal   Collection Time: 04/18/20  7:56 AM  Result Value Ref Range   WBC 12.1 (H) 4.0 - 10.5 K/uL   RBC 5.52 (H) 3.87 - 5.11 MIL/uL   Hemoglobin 15.7 (H) 12.0 - 15.0 g/dL   HCT 45.9 36 - 46 %   MCV 83.2 80.0 - 100.0 fL   MCH 28.4 26.0 - 34.0 pg   MCHC 34.2 30.0 - 36.0 g/dL   RDW 13.2 11.5 - 15.5 %   Platelets 401 (H) 150 - 400 K/uL   nRBC 0.0 0.0 - 0.2 %  Glucose, capillary     Status: Abnormal   Collection Time: 04/18/20  8:20 AM  Result Value Ref Range   Glucose-Capillary 266 (H) 70 - 99 mg/dL  Blood gas, venous     Status: Abnormal (Preliminary result)   Collection Time: 04/18/20  8:22 AM  Result Value Ref Range   pH, Ven 7.25 7.25  - 7.43   pCO2, Ven 37 (L) 44 - 60 mmHg   pO2, Ven PENDING 32 - 45 mmHg   Bicarbonate 16.2 (L) 20.0 - 28.0 mmol/L   Acid-base deficit 10.2 (H) 0.0 - 2.0 mmol/L   O2 Saturation 38.6 %   Patient temperature 37.0    Collection site VEIN    Sample type VEIN    ____________________________________________  EKG My review and personal interpretation at Time: 8:15   Indication: weakness  Rate: 80  Rhythm: sinus Axis: normal Other: normal intervals, no stemi ____________________________________________  RADIOLOGY  I personally reviewed all radiographic images ordered to evaluate for the above acute complaints and  reviewed radiology reports and findings.  These findings were personally discussed with the patient.  Please see medical record for radiology report.  ____________________________________________   PROCEDURES  Procedure(s) performed:  .Critical Care Performed by: Merlyn Lot, MD Authorized by: Merlyn Lot, MD   Critical care provider statement:    Critical care time (minutes):  35   Critical care time was exclusive of:  Separately billable procedures and treating other patients   Critical care was necessary to treat or prevent imminent or life-threatening deterioration of the following conditions:  Endocrine crisis   Critical care was time spent personally by me on the following activities:  Development of treatment plan with patient or surrogate, discussions with consultants, evaluation of patient's response to treatment, examination of patient, obtaining history from patient or surrogate, ordering and performing treatments and interventions, ordering and review of laboratory studies, ordering and review of radiographic studies, pulse oximetry, re-evaluation of patient's condition and review of old charts      Critical Care performed: yes ____________________________________________   INITIAL IMPRESSION / [place] / ED COURSE  Pertinent labs &  imaging results that were available during my care of the patient were reviewed by me and considered in my medical decision making (see chart for details).   DDX: dka, hhns, aki, electrolyte abn, uti, sepsis  Catherine [name] is a 19 y.o. who presents to the ED with presentation as described above.  Patient mildly tachycardic does appear dehydrated.  Blood work ordered for above differential does show evidence of high anion gap acute metabolic acidosis with respiratory compensation as well as mild hypokalemia.  Have ordered IV fluid resuscitation as well as IV insulin drip and replete K with IV K, no EKG changes.  She is a sinus tachycardia on telemetry monitor.  No signs of sepsis.  Abdominal exam is soft and benign.  Will discuss with hospitalist for admission.     The patient was evaluated in Emergency Department today for the symptoms described in the history of present illness. He/she was evaluated in the context of the global COVID-19 pandemic, which necessitated consideration that the patient might be at risk for infection with the SARS-CoV-2 virus that causes COVID-19. Institutional protocols and algorithms that pertain to the evaluation of patients at risk for COVID-19 are in a state of rapid change based on information released by regulatory bodies including the CDC and federal and state organizations. These policies and algorithms were followed during the patient's care in the ED.  As part of my medical decision making, I reviewed the following data within the [place] notes reviewed and incorporated, Labs reviewed, notes from prior ED visits and [place] Controlled Substance Database   ____________________________________________   FINAL CLINICAL IMPRESSION(S) / ED DIAGNOSES  Final diagnoses:  Diabetic ketoacidosis without coma associated with type 1 diabetes mellitus ([place])      NEW MEDICATIONS STARTED DURING THIS VISIT:  New Prescriptions   No  medications on file     Note:  This document was prepared using Dragon voice recognition software and may include unintentional dictation errors.    Merlyn Lot, MD 04/18/20 [phone]

## 2020-04-18 NOTE — ED Notes (Signed)
Pt given meal tray at this time 

## 2020-04-18 NOTE — ED Triage Notes (Signed)
Pt to ED POV states she thinks she is going into DKA, Type 1 diabetic. States last sugar was in the 300's and they have been "all over the place" Denies increased urination or thirst. +vomitng, nausea CBG 266

## 2020-04-18 NOTE — ED Notes (Signed)
Pt assisted to the bathroom at this time. UA collected by this RN. Admitting MD made aware BMET resulted at this time.

## 2020-04-18 NOTE — ED Notes (Signed)
Admitting MD at bedside at this time. Endotool adjusted per protocol by this RN. Bloodwork sent to lab by this RN. Medications administered as ordered. Pt continues to rest in bed with NAD noted at this time. Call bell remains within reach of patient. Pt denies further needs. Lights remain dimmed for patient comfort.

## 2020-04-18 NOTE — H&P (Signed)
History and Physical    Catherine [name] YQM:[id number] DOB: [birth year] DOA: 04/18/2020  PCP: [facility], [facility]   Patient coming from: Home  I have personally briefly reviewed patient's old medical records in [facility]  Chief Complaint: Nausea/vomiting  HPI: Catherine [name] is a 19 y.o. female with medical history significant for diabetes mellitus, history of nephrotic syndrome who presents to the emergency room via private vehicle with complaints of nausea and vomiting.  Per patient she felt like she was going into DKA.  She states that her blood sugars have been all over the place. She also complains of generalized malaise and weakness but denies having any frequency of urination, no increased thirst, no changes in her bowel habits, no fever, no chills, no chest pain, no diaphoresis or palpitations Labs 7.25/37/16/38.6 Sodium 136, potassium 3.4, chloride 99, bicarb 16, glucose 281, BUN 26, creatinine 1.19, calcium 9.8, lactic acid 0.9, white count 12.1, hemoglobin 15.7, hematocrit 45.9, MCV 83.2, RDW 13.2, platelet count 401, anion gap of 21 Twelve-lead EKG shows sinus rhythm with right axis deviation   ED Course: Patient is a 19 year old female with type 1 diabetes mellitus who presents to the emergency room for evaluation of nausea, vomiting and inability to tolerate any oral intake.  She states that her blood sugars have been erratic at home and she was concerned that she may be going into DKA.  Patient has an anion gap of 21 and serum bicarb of 16. She received IVF hydration and was started on an insulin drip. She will be admitted to the hospital for further evaluation.   Review of Systems: As per HPI otherwise 10 point review of systems negative.    Past Medical History:  Diagnosis Date  . Diabetes mellitus without complication (HCC)   . 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 Nephritic syndrome   . Nephrotic syndrome 08/23/2019    No past surgical  history on file.   reports that she has never smoked. She has never used smokeless tobacco. She reports previous alcohol use. She reports previous drug use.  No Known Allergies  No family history on file.   Prior to Admission medications   Medication Sig Start Date End Date Taking? Authorizing Provider  insulin aspart (NOVOLOG) 100 UNIT/ML injection Inject 5-8 Units into the skin 3 (three) times daily before meals.   Yes [provider]  LANTUS 100 UNIT/ML injection Inject 30 Units into the skin at bedtime.  01/27/20  Yes [provider]  predniSONE (DELTASONE) 20 MG tablet Take 20 mg by mouth daily as needed.  01/27/20  Yes [provider]  tacrolimus (PROGRAF) 1 MG capsule Take 2 mg by mouth 2 (two) times daily.   Yes [provider]  azithromycin (ZITHROMAX Z-PAK) 250 MG tablet 1 tablet by mouth daily Patient not taking: Reported on 04/18/2020 10/07/19   Sharyn Creamer, MD  cefpodoxime (VANTIN) 200 MG tablet Take 1 tablet (200 mg total) by mouth 2 (two) times daily. Patient not taking: Reported on 04/18/2020 10/07/19   Sharyn Creamer, MD  furosemide (LASIX) 20 MG tablet Take 1 tablet (20 mg total) by mouth daily as needed for edema. 08/16/19 08/15/20  Jene Every, MD  NOVOLOG 100 UNIT/ML injection Inject 0-100 Units into the skin daily. Patient has insulin pump. Use as directed via insulin pen> Patient not taking: Reported on 04/18/2020 08/24/19 10/07/19  Lurene Shadow, MD  ondansetron (ZOFRAN ODT) 4 MG disintegrating tablet Take 1 tablet (4 mg total) by  mouth every 6 (six) hours as needed for nausea or vomiting. Patient not taking: Reported on 04/18/2020 10/07/19   Sharyn Creamer, MD    Physical Exam: Vitals:   04/18/20 0830 04/18/20 0930 04/18/20 1119 04/18/20 1135  BP: (!) 129/93 (!) 126/95 101/66 103/68  Pulse: 78 81 81 87  Resp: 20 18 (!) 21 (!) 23  Temp:      TempSrc:      SpO2: 99% 100% 100% 100%  Weight:      Height:         Vitals:   04/18/20  0830 04/18/20 0930 04/18/20 1119 04/18/20 1135  BP: (!) 129/93 (!) 126/95 101/66 103/68  Pulse: 78 81 81 87  Resp: 20 18 (!) 21 (!) 23  Temp:      TempSrc:      SpO2: 99% 100% 100% 100%  Weight:      Height:        Constitutional: NAD, alert and oriented x 3 Eyes: PERRL, lids and conjunctivae normal ENMT: Mucous membranes are dry  Neck: normal, supple, no masses, no thyromegaly Respiratory: clear to auscultation bilaterally, no wheezing, no crackles. Normal respiratory effort. No accessory muscle use.  Cardiovascular: Regular rate and rhythm, no murmurs / rubs / gallops. No extremity edema. 2+ pedal pulses. No carotid bruits.  Abdomen: no tenderness, no masses palpated. No hepatosplenomegaly. Bowel sounds positive.  Musculoskeletal: no clubbing / cyanosis. No joint deformity upper and lower extremities.  Skin: no rashes, lesions, ulcers.  Neurologic: No gross focal neurologic deficit.  Generalized weakness Psychiatric: Normal mood and affect.   Labs on Admission: I have personally reviewed following labs and imaging studies  CBC: Recent Labs  Lab 04/18/20 0756  WBC 12.1*  HGB 15.7*  HCT 45.9  MCV 83.2  PLT 401*   Basic Metabolic Panel: Recent Labs  Lab 04/18/20 0756 04/18/20 1112  NA 136 139  K 3.4* 4.0  CL 99 111  CO2 16* 14*  GLUCOSE 281* 212*  BUN 26* 19  CREATININE 1.19* 0.76  CALCIUM 9.8 8.1*   GFR: Estimated Creatinine Clearance: 82.9 mL/min (by C-G formula based on SCr of 0.76 mg/dL). Liver Function Tests: No results for input(s): AST, ALT, ALKPHOS, BILITOT, PROT, ALBUMIN in the last 168 hours. No results for input(s): LIPASE, AMYLASE in the last 168 hours. No results for input(s): AMMONIA in the last 168 hours. Coagulation Profile: No results for input(s): INR, PROTIME in the last 168 hours. Cardiac Enzymes: No results for input(s): CKTOTAL, CKMB, CKMBINDEX, TROPONINI in the last 168 hours. BNP (last 3 results) No results for input(s): PROBNP in  the last 8760 hours. HbA1C: No results for input(s): HGBA1C in the last 72 hours. CBG: Recent Labs  Lab 04/18/20 0820 04/18/20 0953 04/18/20 1110 04/18/20 1209  GLUCAP 266* 217* 179* 165*   Lipid Profile: No results for input(s): CHOL, HDL, LDLCALC, TRIG, CHOLHDL, LDLDIRECT in the last 72 hours. Thyroid Function Tests: No results for input(s): TSH, T4TOTAL, FREET4, T3FREE, THYROIDAB in the last 72 hours. Anemia Panel: No results for input(s): VITAMINB12, FOLATE, FERRITIN, TIBC, IRON, RETICCTPCT in the last 72 hours. Urine analysis:    Component Value Date/Time   COLORURINE YELLOW (A) 10/07/2019 1508   APPEARANCEUR CLEAR (A) 10/07/2019 1508   LABSPEC 1.045 (H) 10/07/2019 1508   PHURINE 6.0 10/07/2019 1508   GLUCOSEU >=500 (A) 10/07/2019 1508   HGBUR NEGATIVE 10/07/2019 1508   BILIRUBINUR NEGATIVE 10/07/2019 1508   KETONESUR 20 (A) 10/07/2019 1508   PROTEINUR >=300 (  A) 10/07/2019 1508   NITRITE NEGATIVE 10/07/2019 1508   LEUKOCYTESUR NEGATIVE 10/07/2019 1508    Radiological Exams on Admission: No results found.  EKG: Independently reviewed.  Sinus rhythm Right axis deviation  Assessment/Plan Active Problems:   Nephrotic syndrome   DKA (diabetic ketoacidosis) (HCC)     Diabetic ketoacidosis Patient with a history of type 1 insulin-dependent diabetes mellitus who presents to the ER for evaluation of nausea and vomiting as well as erratic blood sugars at home Patient was found to be in DKA with an anion gap of 21 Aggressive IV fluid resuscitation Start patient on insulin drip per protocol Check and supplement electrolytes    Nephrotic syndrome Continue tacrolimus    DVT prophylaxis: Lovenox Code Status: Full code Family Communication: Plan of care was discussed with the patient at the bedside and all questions and concerns have been addressed. She verbalizes understanding and agrees with the plan Disposition Plan: Back to previous home environment Consults  called:  None    Phillippe Orlick MD [facility]     04/18/2020, 12:16 PM

## 2020-04-18 NOTE — ED Notes (Signed)
Per admitting MD, pt to remain on insulin drip until repeat BMET and beta-hydroxybutyric acid results.

## 2020-04-18 NOTE — ED Notes (Signed)
Pt resting in bed with eyes closed, respirations even and unlabored, lights dimmed again for patient comfort. Pt denies any needs at this time. Call bell remains within reach of patient at this time.

## 2020-04-18 NOTE — ED Notes (Signed)
Pt resting in bed at this time. Pt ate approx 10% of lunch tray at this time. Pt states she does not like the lunch tray.

## 2020-04-18 NOTE — ED Notes (Signed)
Pt provided with another warm blanket per her request. Pt denies further needs at this time.

## 2020-04-18 NOTE — ED Notes (Signed)
Dr. Joylene Igo made aware that patient has not vomited since eating her lunch tray and that 2nd BMET has resulted. Awaiting orders.

## 2020-04-18 NOTE — ED Notes (Signed)
CBG obtained by this RN. Insulin drip adjusted per Endotool. Pt continues to rest in bed with eyes closed. Lights remain dimmed for patient comfort patient denies further needs at this time. Per Admitting MD, will see if patient able to tolerate liquid diet prior to changing from insulin drip to subq insulin.

## 2020-04-18 NOTE — ED Notes (Signed)
Pt c/o increasing weakness, intermittent SOB, general malaise, N/V, variable CBG's at home x 2-3 days. Pt states is type 1 DM, normal CBG between 150-250, 2-3 days has been "all over the place". Pt states last tested urine for Ketones on Wednesday, no ketones in urine. Pt A&O x4, able to speak in full and complete sentences without difficulty, NSR on the monitor, respirations even and unlabored, skin warm, dry and intact.   Pt provided with warm blankets, lights dimmed for comfort, call bell within reach of patient at this time.

## 2020-04-18 NOTE — ED Notes (Signed)
Admitting MD notified that patient's repeat metabolic panel resulted. Awaiting orders at this time regarding insulin drip.

## 2020-04-18 NOTE — ED Notes (Signed)
Medications administered as ordered. Pt resting in bed with lights dimmed. Call bell within reach at this time. Pt denies further needs. VSS and WNL.

## 2020-04-18 NOTE — ED Notes (Signed)
Potassium decreased to 6mL/hr due to patient c/o burning at the IV site.

## 2020-04-18 NOTE — ED Notes (Signed)
Messaged Dr Para March regarding patient's cbg.  Patient to remain on endo tool through the night and attempt to transition in the morning.

## 2020-04-19 LAB — GLUCOSE, CAPILLARY
Glucose-Capillary: 114 mg/dL — ABNORMAL HIGH (ref 70–99)
Glucose-Capillary: 153 mg/dL — ABNORMAL HIGH (ref 70–99)
Glucose-Capillary: 218 mg/dL — ABNORMAL HIGH (ref 70–99)
Glucose-Capillary: 254 mg/dL — ABNORMAL HIGH (ref 70–99)
Glucose-Capillary: 298 mg/dL — ABNORMAL HIGH (ref 70–99)
Glucose-Capillary: 317 mg/dL — ABNORMAL HIGH (ref 70–99)
Glucose-Capillary: 61 mg/dL — ABNORMAL LOW (ref 70–99)
Glucose-Capillary: 85 mg/dL (ref 70–99)
Glucose-Capillary: 98 mg/dL (ref 70–99)
Glucose-Capillary: >600 mg/dL (ref 70–99)

## 2020-04-19 LAB — BASIC METABOLIC PANEL
Anion gap: 8 (ref 5–15)
Anion gap: 11 (ref 5–15)
BUN: 11 mg/dL (ref 6–20)
BUN: 9 mg/dL (ref 6–20)
CO2: 19 mmol/L — ABNORMAL LOW (ref 22–32)
CO2: 15 mmol/L — ABNORMAL LOW (ref 22–32)
Calcium: 8.1 mg/dL — ABNORMAL LOW (ref 8.9–10.3)
Calcium: 7.8 mg/dL — ABNORMAL LOW (ref 8.9–10.3)
Chloride: 114 mmol/L — ABNORMAL HIGH (ref 98–111)
Chloride: 110 mmol/L (ref 98–111)
Creatinine, Ser: 0.57 mg/dL (ref 0.44–1.00)
Creatinine, Ser: 0.61 mg/dL (ref 0.44–1.00)
GFR, Estimated: >60 mL/min (ref >60)
GFR, Estimated: >60 mL/min (ref >60)
Glucose, Bld: 133 mg/dL — ABNORMAL HIGH (ref 70–99)
Glucose, Bld: 252 mg/dL — ABNORMAL HIGH (ref 70–99)
Potassium: 3.9 mmol/L (ref 3.5–5.1)
Potassium: 3.8 mmol/L (ref 3.5–5.1)
Sodium: 141 mmol/L (ref 135–145)
Sodium: 136 mmol/L (ref 135–145)

## 2020-04-19 LAB — HEPATIC FUNCTION PANEL
ALT: 18 U/L (ref 0–44)
AST: 26 U/L (ref 15–41)
Albumin: 2.7 g/dL — ABNORMAL LOW (ref 3.5–5.0)
Alkaline Phosphatase: 68 U/L (ref 38–126)
Bilirubin, Direct: <0.1 mg/dL (ref 0.0–0.2)
Total Bilirubin: 0.6 mg/dL (ref 0.3–1.2)
Total Protein: 5.4 g/dL — ABNORMAL LOW (ref 6.5–8.1)

## 2020-04-19 LAB — LIPID PANEL
Cholesterol: 174 mg/dL (ref 0–200)
HDL: 32 mg/dL — ABNORMAL LOW (ref >40)
LDL Cholesterol: 116 mg/dL — ABNORMAL HIGH (ref 0–99)
Total CHOL/HDL Ratio: 5.4 RATIO
Triglycerides: 131 mg/dL (ref <150)
VLDL: 26 mg/dL (ref 0–40)

## 2020-04-19 LAB — BETA-HYDROXYBUTYRIC ACID: Beta-Hydroxybutyric Acid: 0.8 mmol/L — ABNORMAL HIGH (ref 0.05–0.27)

## 2020-04-19 LAB — HEMOGLOBIN A1C
Hgb A1c MFr Bld: 14.3 % — ABNORMAL HIGH (ref 4.8–5.6)
Mean Plasma Glucose: 364 mg/dL

## 2020-04-19 MED ORDER — POTASSIUM CHLORIDE IN NACL 20-0.9 MEQ/L-% IV SOLN
INTRAVENOUS | Status: DC
  Filled 2020-04-19: qty 1000

## 2020-04-19 MED ORDER — INSULIN GLARGINE 100 UNIT/ML ~~LOC~~ SOLN
25.0000 [IU] | Freq: Every day | SUBCUTANEOUS | Status: DC
  Administered 2020-04-19: 25 [IU] via SUBCUTANEOUS
  Filled 2020-04-19 (×3): qty 0.25

## 2020-04-19 MED ORDER — INSULIN GLARGINE 100 UNIT/ML ~~LOC~~ SOLN: 25.0000 [IU] | SUBCUTANEOUS | Status: DC

## 2020-04-19 MED ORDER — KCL IN DEXTROSE-NACL 20-5-0.45 MEQ/L-%-% IV SOLN: INTRAVENOUS | Status: DC

## 2020-04-19 MED ORDER — SODIUM CHLORIDE 0.9 % IV SOLN: INTRAVENOUS | Status: DC

## 2020-04-19 MED ORDER — INSULIN ASPART 100 UNIT/ML ~~LOC~~ SOLN
4.0000 [IU] | Freq: Three times a day (TID) | SUBCUTANEOUS | Status: DC
  Administered 2020-04-19 – 2020-04-20 (×2): 4 [IU] via SUBCUTANEOUS
  Filled 2020-04-19 (×2): qty 1

## 2020-04-19 MED ORDER — INSULIN ASPART 100 UNIT/ML ~~LOC~~ SOLN
0.0000 [IU] | Freq: Three times a day (TID) | SUBCUTANEOUS | Status: DC
  Administered 2020-04-19: 3 [IU] via SUBCUTANEOUS
  Administered 2020-04-19: 7 [IU] via SUBCUTANEOUS
  Filled 2020-04-19: qty 1

## 2020-04-19 MED ORDER — INSULIN ASPART 100 UNIT/ML ~~LOC~~ SOLN
0.0000 [IU] | SUBCUTANEOUS | Status: DC
  Administered 2020-04-19: 3 [IU] via SUBCUTANEOUS
  Filled 2020-04-19: qty 1

## 2020-04-19 MED ORDER — INSULIN ASPART 100 UNIT/ML ~~LOC~~ SOLN
0.0000 [IU] | Freq: Every day | SUBCUTANEOUS | Status: DC
  Administered 2020-04-19: 3 [IU] via SUBCUTANEOUS
  Filled 2020-04-19: qty 1

## 2020-04-19 NOTE — ED Notes (Signed)
CBG 317, taken by NT

## 2020-04-19 NOTE — ED Notes (Signed)
This RN messaged and made Agbata MD aware of pt status. Awaiting orders at this time.

## 2020-04-19 NOTE — ED Notes (Signed)
Admitting MD [facility] messaged about pt insulin orders.

## 2020-04-19 NOTE — ED Notes (Signed)
Thorough report given to [facility] on floor (1A). Patient alert and oriented on transport from ED. Patient verbalized understanding of admission. Patient with all belongings taken on transport.

## 2020-04-19 NOTE — ED Notes (Addendum)
MD Agbata states pt was to be off endotool at 9 PM last night. Will keep pt off endotool per order and wait for new orders at this time.   Pt level of care changed to medsurg at this time.

## 2020-04-19 NOTE — Progress Notes (Signed)
PROGRESS NOTE    Catherine [name]  EVO:[id number] DOB: 29-Oct-2000 DOA: 04/18/2020 PCP: [facility], [facility]  Outpatient Specialists: [facility], endocrinology    Brief Narrative:   Catherine [name] is a 19 y.o. female with medical history significant for diabetes mellitus, history of nephrotic syndrome who presents to the emergency room via private vehicle with complaints of nausea and vomiting.  Per patient she felt like she was going into DKA.  She states that her blood sugars have been all over the place. She also complains of generalized malaise and weakness but denies having any frequency of urination, no increased thirst, no changes in her bowel habits, no fever, no chills, no chest pain, no diaphoresis or palpitations Labs 7.25/37/16/38.6 Sodium 136, potassium 3.4, chloride 99, bicarb 16, glucose 281, BUN 26, creatinine 1.19, calcium 9.8, lactic acid 0.9, white count 12.1, hemoglobin 15.7, hematocrit 45.9, MCV 83.2, RDW 13.2, platelet count 401, anion gap of 21 Twelve-lead EKG shows sinus rhythm with right axis deviation   ED Course: Patient is a 19 year old female with type 1 diabetes mellitus who presents to the emergency room for evaluation of nausea, vomiting and inability to tolerate any oral intake.  She states that her blood sugars have been erratic at home and she was concerned that she may be going into DKA.  Patient has an anion gap of 21 and serum bicarb of 16. She received IVF hydration and was started on an insulin drip. She will be admitted to the hospital for further evaluation.    Assessment & Plan:   Active Problems:   Nephrotic syndrome   DKA (diabetic ketoacidosis) (HCC)   # Diabetic ketoacidosis Patient with a history of type 1 insulin-dependent diabetes mellitus who presents to the ER for evaluation of nausea and vomiting as well as erratic blood sugars at home, in the setting of recent poor compliance with her home insulin.  Borderline acidotic with low bicarb and elevated gap. S/p 3 L IV fluids. Now off insulin gtt, received 20 of home lantus yesterday evening. K 3.8 this morning, glucose 133, bicarb 19 - diabetes coordinator consulted, will appreciate recs - increase qhs lantus to 25 - bid bmp - SSI q4 - continue fluids NS @ 125 with 20 kcl  # Nephrotic syndrome / minimal change disease - does not appear to be in exacerbation, no edema, no sig proteinuria seen on dip. - Continue tacrolimus, f/u upc and lipids   DVT prophylaxis: lovenox Code Status: full Family Communication: none at bedside  Status is: Inpatient  Remains inpatient appropriate because:IV treatments appropriate due to intensity of illness or inability to take PO   Dispo: The patient is from: Home              Anticipated d/c is to: Home              Anticipated d/c date is: 2 days              Patient currently is not medically stable to d/c.        Consultants:  none3  Procedures: none  Antimicrobials:  none    Subjective: This morning nausea/vomiting resolved. Denies abd pain or ha. No cough or sob or fevers or dysuria. No dyspnea.  Objective: Vitals:   04/19/20 0300 04/19/20 0400 04/19/20 0500 04/19/20 0600  BP: 117/84 123/90 103/78 (!) 125/92  Pulse: 78 75 75 84  Resp:  (!) 21 17 18   Temp:      TempSrc:  SpO2: 100% 100% 100% 96%  Weight:      Height:        Intake/Output Summary (Last 24 hours) at 04/19/2020 0741 Last data filed at 04/18/2020 1850 Gross per 24 hour  Intake 3749.49 ml  Output 800 ml  Net 2949.49 ml   Filed Weights   04/18/20 0752  Weight: 52.2 kg    Examination:  General exam: Appears calm and comfortable  Respiratory system: Clear to auscultation. Respiratory effort normal. Cardiovascular system: S1 & S2 heard, RRR. No JVD, murmurs, rubs, gallops or clicks. No pedal edema. Gastrointestinal system: Abdomen is nondistended, soft and nontender. No organomegaly or masses felt.  Normal bowel sounds heard. Central nervous system: Alert and oriented. No focal neurological deficits. Extremities: Symmetric 5 x 5 power. Skin: No rashes, lesions or ulcers Psychiatry: Judgement and insight appear normal. Mood & affect appropriate.     Data Reviewed: I have personally reviewed following labs and imaging studies  CBC: Recent Labs  Lab 04/18/20 0756  WBC 12.1*  HGB 15.7*  HCT 45.9  MCV 83.2  PLT 401*   Basic Metabolic Panel: Recent Labs  Lab 04/18/20 1112 04/18/20 1439 04/18/20 1746 04/18/20 2149 04/19/20 0634  NA 139 137 139 136 141  K 4.0 3.8 3.8 3.6 3.9  CL 111 111 113* 110 114*  CO2 14* 19* 20* 18* 19*  GLUCOSE 212* 194* 143* 350* 133*  BUN 19 14 12 12 11   CREATININE 0.76 0.67 0.69 0.80 0.57  CALCIUM 8.1* 8.1* 8.3* 8.1* 8.1*   GFR: Estimated Creatinine Clearance: 82.9 mL/min (by C-G formula based on SCr of 0.57 mg/dL). Liver Function Tests: No results for input(s): AST, ALT, ALKPHOS, BILITOT, PROT, ALBUMIN in the last 168 hours. No results for input(s): LIPASE, AMYLASE in the last 168 hours. No results for input(s): AMMONIA in the last 168 hours. Coagulation Profile: No results for input(s): INR, PROTIME in the last 168 hours. Cardiac Enzymes: No results for input(s): CKTOTAL, CKMB, CKMBINDEX, TROPONINI in the last 168 hours. BNP (last 3 results) No results for input(s): PROBNP in the last 8760 hours. HbA1C: No results for input(s): HGBA1C in the last 72 hours. CBG: Recent Labs  Lab 04/19/20 0039 04/19/20 0135 04/19/20 0243 04/19/20 0247 04/19/20 0503  GLUCAP 85 98 >600* 61* 114*   Lipid Profile: No results for input(s): CHOL, HDL, LDLCALC, TRIG, CHOLHDL, LDLDIRECT in the last 72 hours. Thyroid Function Tests: No results for input(s): TSH, T4TOTAL, FREET4, T3FREE, THYROIDAB in the last 72 hours. Anemia Panel: No results for input(s): VITAMINB12, FOLATE, FERRITIN, TIBC, IRON, RETICCTPCT in the last 72 hours. Urine analysis:      Component Value Date/Time   COLORURINE YELLOW (A) 04/18/2020 0951   APPEARANCEUR HAZY (A) 04/18/2020 0951   LABSPEC 1.015 04/18/2020 0951   PHURINE 6.0 04/18/2020 0951   GLUCOSEU >=500 (A) 04/18/2020 0951   HGBUR NEGATIVE 04/18/2020 0951   BILIRUBINUR NEGATIVE 04/18/2020 0951   KETONESUR 80 (A) 04/18/2020 0951   PROTEINUR 100 (A) 04/18/2020 0951   NITRITE NEGATIVE 04/18/2020 0951   LEUKOCYTESUR NEGATIVE 04/18/2020 0951   Sepsis Labs: @LABRCNTIP (procalcitonin:4,lacticidven:4)  ) Recent Results (from the past 240 hour(s))  Respiratory Panel by RT PCR (Flu A&B, Covid) - Nasopharyngeal Swab     Status: None   Collection Time: 04/18/20  9:51 AM   Specimen: Nasopharyngeal Swab  Result Value Ref Range Status   SARS Coronavirus 2 by RT PCR NEGATIVE NEGATIVE Final    Comment: (NOTE) SARS-CoV-2 target nucleic acids are NOT  DETECTED.  The SARS-CoV-2 RNA is generally detectable in upper respiratoy specimens during the acute phase of infection. The lowest concentration of SARS-CoV-2 viral copies this assay can detect is 131 copies/mL. A negative result does not preclude SARS-Cov-2 infection and should not be used as the sole basis for treatment or other patient management decisions. A negative result may occur with  improper specimen collection/handling, submission of specimen other than nasopharyngeal swab, presence of viral mutation(s) within the areas targeted by this assay, and inadequate number of viral copies (<131 copies/mL). A negative result must be combined with clinical observations, patient history, and epidemiological information. The expected result is Negative.  Fact Sheet for Patients:  https://www.moore.com/  Fact Sheet for Healthcare Providers:  https://www.young.biz/  This test is no t yet approved or cleared by the [place] FDA and  has been authorized for detection and/or diagnosis of SARS-CoV-2 by FDA under an  Emergency Use Authorization (EUA). This EUA will remain  in effect (meaning this test can be used) for the duration of the COVID-19 declaration under Section 564(b)(1) of the Act, 21 U.S.C. section 360bbb-3(b)(1), unless the authorization is terminated or revoked sooner.     Influenza A by PCR NEGATIVE NEGATIVE Final   Influenza B by PCR NEGATIVE NEGATIVE Final    Comment: (NOTE) The Xpert Xpress SARS-CoV-2/FLU/RSV assay is intended as an aid in  the diagnosis of influenza from Nasopharyngeal swab specimens and  should not be used as a sole basis for treatment. Nasal washings and  aspirates are unacceptable for Xpert Xpress SARS-CoV-2/FLU/RSV  testing.  Fact Sheet for Patients: https://www.moore.com/  Fact Sheet for Healthcare Providers: https://www.young.biz/  This test is not yet approved or cleared by the [place] FDA and  has been authorized for detection and/or diagnosis of SARS-CoV-2 by  FDA under an Emergency Use Authorization (EUA). This EUA will remain  in effect (meaning this test can be used) for the duration of the  Covid-19 declaration under Section 564(b)(1) of the Act, 21  U.S.C. section 360bbb-3(b)(1), unless the authorization is  terminated or revoked. Performed at [facility], [address] [place], [place] [address]          Radiology Studies: No results found.      Scheduled Meds: . enoxaparin (LOVENOX) injection  40 mg Subcutaneous Q24H  . insulin aspart  0-15 Units Subcutaneous Q4H  . insulin glargine  25 Units Subcutaneous Q24H  . pantoprazole (PROTONIX) IV  40 mg Intravenous Daily  . tacrolimus  2 mg Oral BID   Continuous Infusions: . 0.9 % NaCl with KCl 20 mEq / L       LOS: 1 day    Time spent: 35 min    Silvano Bilis, MD [facility]   If 7PM-7AM, please contact night-coverage [email or web address] Password [facility] 04/19/2020, 7:41 AM

## 2020-04-19 NOTE — ED Notes (Signed)
Attempted to call report, informed that receiving RN unavailable at this time

## 2020-04-19 NOTE — ED Notes (Signed)
Unable to get any blood from pt by Lucrezia Starch. Lab called and spoke with Nimisha and they will come and collect lab.

## 2020-04-19 NOTE — ED Notes (Signed)
Pt was given 3 units of insulin as per verbal order from MD Wouk at this time

## 2020-04-19 NOTE — ED Notes (Signed)
Pt given meal tray.

## 2020-04-19 NOTE — Plan of Care (Signed)
  Problem: Education: Goal: Knowledge of General Education information will improve Description: Including pain rating scale, medication(s)/side effects and non-pharmacologic comfort measures Outcome: Progressing   Problem: Clinical Measurements: Goal: Will remain free from infection Outcome: Progressing   Problem: Coping: Goal: Level of anxiety will decrease Outcome: Progressing   Problem: Elimination: Goal: Will not experience complications related to bowel motility Outcome: Progressing   Problem: Safety: Goal: Ability to remain free from injury will improve Outcome: Progressing   Problem: Skin Integrity: Goal: Risk for impaired skin integrity will decrease Outcome: Progressing    Pt admitted to the unit from ED and oriented to the room.

## 2020-04-19 NOTE — Progress Notes (Signed)
Inpatient Diabetes Program Recommendations  AACE/ADA: New Consensus Statement on Inpatient Glycemic Control   Target Ranges:  Prepandial:   less than 140 mg/dL      Peak postprandial:   less than 180 mg/dL (1-2 hours)      Critically ill patients:  140 - 180 mg/dL  Results for MARCHETTA, NAVRATIL (MRN [id number]) as of 04/19/2020 07:37  Ref. Range 04/19/2020 00:39 04/19/2020 01:35 04/19/2020 02:43 04/19/2020 02:47 04/19/2020 05:03  Glucose-Capillary Latest Ref Range: 70 - 99 mg/dL 85 98 >562 (HH) 61 (L) 114 (H)  Results for LAMEISHA, SCHUENEMANN (MRN [id number]) as of 04/19/2020 07:37  Ref. Range 04/18/2020 17:44 04/18/2020 18:35 04/18/2020 19:42 04/18/2020 20:34 04/18/2020 21:34 04/18/2020 22:37 04/18/2020 23:45  Glucose-Capillary Latest Ref Range: 70 - 99 mg/dL 696 (H) 295 (H) 284 (H) 272 (H) 338 (H) 250 (H) 115 (H)  Results for SHILAH, HEFEL (MRN [id number]) as of 04/19/2020 12:54  Ref. Range 04/18/2020 07:56  Glucose Latest Ref Range: 70 - 99 mg/dL 725 (H)   Results for ANDREYAH, NATIVIDAD (MRN [id number]) as of 04/19/2020 07:37  Ref. Range 07/28/2019 11:11  Hemoglobin A1C Latest Ref Range: 4.8 - 5.6 % 12.9 (H)   Review of Glycemic Control  Diabetes history: DM1 (makes NO insulin; requires basal, correction, and carbohydrate coverage insulin) Outpatient Diabetes medications: Lantus 30 units QHS, Novolog  Current orders for Inpatient glycemic control: Lantus 25 units QHS, Novolog 0-15 units Q4H  Inpatient Diabetes Program Recommendations:    Insulin: Patient received Lantus 20 units at 19:15 on 04/18/20 and per note by I. Junita Push, RN, IV insulin was stopped around 2am. Please consider changing CBGs to AC&HS, decreasing Novolog to 0-9 units TID with meals, Novolog 0-5 units QHS, and adding Novolog 4 units TID with meals for meal coverage if patient eats at least 50% of meals.  HbgA1C: Current A1C in process.  NOTE: Spoke with patient about diabetes and home regimen for diabetes  control. Patient reports being followed by [facility] for diabetes management and reports that she has an appointment in December. Patient states that she is using Lantus 30 units QHS and Novolog 1 unit for 15 grams of carbs plus additional units for correction TID with meals (If glucose less than 150 mg/dl 0 correction, if CBG 425-956 1 unit, if 201-250 2 units, if 251-300 3 units, if 301-350 4 units, if 351-400 5 units, and if over 400 mg/dl then 6 units) as an outpatient for diabetes control. Patient reports taking DM medications as prescribed and notes that glucose has been very variable over the past week. She reports that glucose was running 100-250 mg/dl Monday-Wednesday of last week and then on Thursday she starting having N/V and not able to keep anything down and glucose was running higher (up over 400 mg/dl at times). Patient states that she was trying to take Novolog more often to get glucose back down but she continued to feel like she was in DKA so she came to the hospital.  Initial glucose was 281 mg/dl on 38/75/64.  Current A1C in process. Discussed glucose and A1C goals. Discussed importance of checking CBGs and maintaining good CBG control to prevent long-term and short-term complications. Patient states that she had been on an insulin pump but she transitioned to SQ insulin this year because "I had gotten lazy and wasn't doing right. So I changed to giving myself shots for now. I plan to talk with the doctor about going to a new insulin pump when I  go in December." Patient reports that she has been on Dexcom CGM in the past but not currently using a CGM. Discussed benefit of CGM to provide more data to provider to assist with insulin adjustments if needed.  Patient reports that she has to use Prednisone as needed but notes that she has not had to use it lately and she is unsure why her glucose has been up and down. Encouraged patient to check glucose 3-4 times per day, to take insulin  consistently, and reach out to Endocrinology if glucose remains uncontrolled at home. Patient reports that she would appreciate a refill for Lantus and Novolog (vials for both) at time of discharge so she has enough to last until her next Endocrinology appointment in December.   Patient verbalized understanding of information discussed and reports no further questions at this time related to diabetes.  Thanks, Orlando Penner, RN, MSN, CDE Diabetes Coordinator Inpatient Diabetes Program [phone] (Team Pager from 8am to 5pm)

## 2020-04-19 NOTE — ED Notes (Signed)
Per [employer], pt has been off endotool since around 0200. Admit MD Hendricks Limes regarding and is aware. Pt sugars have been fluctuating. Last BMP resulted at 0630 Glucose 133.   Admit MD Para March informed and has seen message, but no response.

## 2020-04-19 NOTE — ED Notes (Signed)
Pt with meal tray provided by dietary

## 2020-04-19 NOTE — ED Notes (Signed)
Lab at bedside

## 2020-04-19 NOTE — ED Notes (Signed)
Medication paused due to pt stating swelling to left side of face. Will inform MD

## 2020-04-20 LAB — GLUCOSE, CAPILLARY
Glucose-Capillary: 148 mg/dL — ABNORMAL HIGH (ref 70–99)
Glucose-Capillary: 178 mg/dL — ABNORMAL HIGH (ref 70–99)
Glucose-Capillary: 64 mg/dL — ABNORMAL LOW (ref 70–99)
Glucose-Capillary: 71 mg/dL (ref 70–99)
Glucose-Capillary: 89 mg/dL (ref 70–99)

## 2020-04-20 LAB — COMPREHENSIVE METABOLIC PANEL
ALT: 20 U/L (ref 0–44)
AST: 28 U/L (ref 15–41)
Albumin: 2.7 g/dL — ABNORMAL LOW (ref 3.5–5.0)
Alkaline Phosphatase: 67 U/L (ref 38–126)
Anion gap: 10 (ref 5–15)
BUN: 15 mg/dL (ref 6–20)
CO2: 21 mmol/L — ABNORMAL LOW (ref 22–32)
Calcium: 8.3 mg/dL — ABNORMAL LOW (ref 8.9–10.3)
Chloride: 107 mmol/L (ref 98–111)
Creatinine, Ser: 0.57 mg/dL (ref 0.44–1.00)
GFR, Estimated: >60 mL/min (ref >60)
Glucose, Bld: 228 mg/dL — ABNORMAL HIGH (ref 70–99)
Potassium: 3.4 mmol/L — ABNORMAL LOW (ref 3.5–5.1)
Sodium: 138 mmol/L (ref 135–145)
Total Bilirubin: 0.4 mg/dL (ref 0.3–1.2)
Total Protein: 5.5 g/dL — ABNORMAL LOW (ref 6.5–8.1)

## 2020-04-20 LAB — MICROALBUMIN / CREATININE URINE RATIO
Creatinine, Urine: 62.3 mg/dL
Microalb Creat Ratio: 468 mg/g creat — ABNORMAL HIGH (ref 0–29)
Microalb, Ur: 291.5 ug/mL — ABNORMAL HIGH

## 2020-04-20 NOTE — Progress Notes (Signed)
Inpatient Diabetes Program Recommendations  AACE/ADA: New Consensus Statement on Inpatient Glycemic Control   Target Ranges:  Prepandial:   less than 140 mg/dL      Peak postprandial:   less than 180 mg/dL (1-2 hours)      Critically ill patients:  140 - 180 mg/dL  Results for KASIYA, BURCK (MRN [id number]) as of 04/20/2020 07:14  Ref. Range 04/19/2020 07:39 04/19/2020 13:53 04/19/2020 17:50 04/19/2020 19:37 04/19/2020 20:26 04/20/2020 00:40 04/20/2020 04:23  Glucose-Capillary Latest Ref Range: 70 - 99 mg/dL 409 (H) 811 (H) 914 (H) 298 (H) 254 (H) 148 (H) 178 (H)    Review of Glycemic Control  Diabetes history: DM1 (makes NO insulin; requires basal, correction, and carbohydrate coverage insulin) Outpatient Diabetes medications: Lantus 30 units QHS, Novolog  Current orders for Inpatient glycemic control: Lantus 25 units QHS, Novolog 0-9 units TID with meals, Novolog 0-5 units QHS, Novolog 4 units TID with meals   Inpatient Diabetes Program Recommendations:    Insulin: Please consider increasing meal coverage to Novolog 6 units TID with meals.  Thanks, Orlando Penner, RN, MSN, CDE Diabetes Coordinator Inpatient Diabetes Program [phone] (Team Pager from 8am to 5pm)

## 2020-04-20 NOTE — Discharge Summary (Addendum)
Catherine [name] SWF:[id number] DOB: 2000-07-05 DOA: 04/18/2020  PCP: [facility]  Admit date: 04/18/2020 Discharge date: 04/20/2020  Time spent: 25 minutes  Recommendations for Outpatient Follow-up:  1. Close f/u with outpatient [facility] endocrinology and nephrology     Discharge Diagnoses:  Active Problems:   Nephrotic syndrome   DKA (diabetic ketoacidosis) (HCC)   Discharge Condition: good  Diet recommendation: carb controlled  Filed Weights   04/18/20 0752 04/19/20 1105  Weight: 52.2 kg 52.2 kg    History of present illness:  Catherine G Sellarsis a 19 y.o.femalewith medical history significant fordiabetes mellitus, history of nephrotic syndrome who presents to the emergency room via private vehicle with complaints of nausea and vomiting. Per patient she felt like she was going into DKA. She states that her blood sugars have been all over the place. She also complains of generalized malaise and weakness but denies having any frequency of urination, no increased thirst, no changes in her bowel habits, no fever, no chills, no chest pain, no diaphoresis or palpitations Labs7.25/37/16/38.6 Sodium 136, potassium 3.4, chloride 99, bicarb16,glucose 281, BUN 26, creatinine 1.19, calcium 9.8, lactic acid 0.9, white count 12.1, hemoglobin 15.7, hematocrit 45.9, MCV 83.2, RDW 13.2, platelet count 401, anion gap of 21 Twelve-lead EKG shows sinus rhythm with right axis deviation  ED Course:Patient is a 19 year old female with type 1 diabetes mellitus who presents to the emergency room for evaluation of nausea, vomiting and inability to tolerate any oral intake. She states that her blood sugars have been erratic at home and she was concerned that she may be going into DKA. Patient has an anion gap of 21 and serum bicarb of 16. She received IVF hydration and was started on an insulin drip. She will be admitted to the hospital for further  evaluation.  Hospital Course:  # Diabetic ketoacidosis Patient with a history of type 1 insulin-dependent diabetes mellitus who presents to the ER for evaluation of nausea and vomiting as well as erratic blood sugars at home, in the setting of recent poor compliance with her home insulin. Borderline acidotic with low bicarb and elevated gap. S/p fluids, insulin drip, re-started home regimen, most recent glucose wnl, pt tolerating diet and feeling well, gap is closed. - resume home meds, discussed importance of med compliance - close f/u with outpt endocrinologist, pt to schedule (has appt in December but I advised closer f/u)  # Nephrotic syndrome / minimal change disease - does not appear to be in exacerbation, no edema, no sig proteinuria seen on dip. - Continue tacrolimus  Procedures:  none   Consultations:  none  Discharge Exam: Vitals:   04/20/20 0855 04/20/20 1138  BP:  (!) 142/109  Pulse:  83  Resp:  17  Temp:    SpO2: 100% 97%    General exam: Appears calm and comfortable  Respiratory system: Clear to auscultation. Respiratory effort normal. Cardiovascular system: S1 & S2 heard, RRR. No JVD, murmurs, rubs, gallops or clicks. No pedal edema. Gastrointestinal system: Abdomen is nondistended, soft and nontender. No organomegaly or masses felt. Normal bowel sounds heard. Central nervous system: Alert and oriented. No focal neurological deficits. Extremities: Symmetric 5 x 5 power. Skin: No rashes, lesions or ulcers Psychiatry: Judgement and insight appear normal. Mood & affect appropriate.   Discharge Instructions   Discharge Instructions    Call MD for:  difficulty breathing, headache or visual disturbances   Complete by: As directed    Call MD for:  extreme fatigue  Complete by: As directed    Call MD for:  persistant dizziness or light-headedness   Complete by: As directed    Call MD for:  persistant nausea and vomiting   Complete by: As directed    Call MD  for:  redness, tenderness, or signs of infection (pain, swelling, redness, odor or green/yellow discharge around incision site)   Complete by: As directed    Call MD for:  severe uncontrolled pain   Complete by: As directed    Call MD for:  temperature >100.4   Complete by: As directed    Diet Carb Modified   Complete by: As directed    Increase activity slowly   Complete by: As directed      Allergies as of 04/20/2020   No Known Allergies     Medication List    STOP taking these medications   azithromycin 250 MG tablet Commonly known as: Zithromax Z-Pak   cefpodoxime 200 MG tablet Commonly known as: VANTIN     TAKE these medications   furosemide 20 MG tablet Commonly known as: Lasix Take 1 tablet (20 mg total) by mouth daily as needed for edema.   insulin aspart 100 UNIT/ML injection Commonly known as: novoLOG Inject 5-8 Units into the skin 3 (three) times daily before meals. What changed: Another medication with the same name was removed. Continue taking this medication, and follow the directions you see here.   Lantus 100 UNIT/ML injection Generic drug: insulin glargine Inject 30 Units into the skin at bedtime.   ondansetron 4 MG disintegrating tablet Commonly known as: Zofran ODT Take 1 tablet (4 mg total) by mouth every 6 (six) hours as needed for nausea or vomiting.   predniSONE 20 MG tablet Commonly known as: DELTASONE Take 20 mg by mouth daily as needed.   tacrolimus 1 MG capsule Commonly known as: PROGRAF Take 2 mg by mouth 2 (two) times daily.      No Known Allergies  Follow-up Information    Your [facility] endocrinologist and nephrologist. Schedule an appointment as soon as possible for a visit.                The results of significant diagnostics from this hospitalization (including imaging, microbiology, ancillary and laboratory) are listed below for reference.    Significant Diagnostic Studies: No results found.  Microbiology: Recent  Results (from the past 240 hour(s))  Respiratory Panel by RT PCR (Flu A&B, Covid) - Nasopharyngeal Swab     Status: None   Collection Time: 04/18/20  9:51 AM   Specimen: Nasopharyngeal Swab  Result Value Ref Range Status   SARS Coronavirus 2 by RT PCR NEGATIVE NEGATIVE Final    Comment: (NOTE) SARS-CoV-2 target nucleic acids are NOT DETECTED.  The SARS-CoV-2 RNA is generally detectable in upper respiratoy specimens during the acute phase of infection. The lowest concentration of SARS-CoV-2 viral copies this assay can detect is 131 copies/mL. A negative result does not preclude SARS-Cov-2 infection and should not be used as the sole basis for treatment or other patient management decisions. A negative result may occur with  improper specimen collection/handling, submission of specimen other than nasopharyngeal swab, presence of viral mutation(s) within the areas targeted by this assay, and inadequate number of viral copies (<131 copies/mL). A negative result must be combined with clinical observations, patient history, and epidemiological information. The expected result is Negative.  Fact Sheet for Patients:  https://www.moore.com/  Fact Sheet for Healthcare Providers:  https://www.young.biz/  This test  is no t yet approved or cleared by the [place] and  has been authorized for detection and/or diagnosis of SARS-CoV-2 by FDA under an Emergency Use Authorization (EUA). This EUA will remain  in effect (meaning this test can be used) for the duration of the COVID-19 declaration under Section 564(b)(1) of the Act, 21 U.S.C. section 360bbb-3(b)(1), unless the authorization is terminated or revoked sooner.     Influenza A by PCR NEGATIVE NEGATIVE Final   Influenza B by PCR NEGATIVE NEGATIVE Final    Comment: (NOTE) The Xpert Xpress SARS-CoV-2/FLU/RSV assay is intended as an aid in  the diagnosis of influenza from Nasopharyngeal swab  specimens and  should not be used as a sole basis for treatment. Nasal washings and  aspirates are unacceptable for Xpert Xpress SARS-CoV-2/FLU/RSV  testing.  Fact Sheet for Patients: https://www.moore.com/  Fact Sheet for Healthcare Providers: https://www.young.biz/  This test is not yet approved or cleared by the [place] FDA and  has been authorized for detection and/or diagnosis of SARS-CoV-2 by  FDA under an Emergency Use Authorization (EUA). This EUA will remain  in effect (meaning this test can be used) for the duration of the  Covid-19 declaration under Section 564(b)(1) of the Act, 21  U.S.C. section 360bbb-3(b)(1), unless the authorization is  terminated or revoked. Performed at [facility], [address] [place], [place] [address]      Labs: Basic Metabolic Panel: Recent Labs  Lab 04/18/20 1746 04/18/20 2149 04/19/20 0634 04/19/20 1421 04/20/20 0330  NA 139 136 141 136 138  K 3.8 3.6 3.9 3.8 3.4*  CL 113* 110 114* 110 107  CO2 20* 18* 19* 15* 21*  GLUCOSE 143* 350* 133* 252* 228*  BUN 12 12 11 9 15   CREATININE 0.69 0.80 0.57 0.61 0.57  CALCIUM 8.3* 8.1* 8.1* 7.8* 8.3*   Liver Function Tests: Recent Labs  Lab 04/19/20 1421 04/20/20 0330  AST 26 28  ALT 18 20  ALKPHOS 68 67  BILITOT 0.6 0.4  PROT 5.4* 5.5*  ALBUMIN 2.7* 2.7*   No results for input(s): LIPASE, AMYLASE in the last 168 hours. No results for input(s): AMMONIA in the last 168 hours. CBC: Recent Labs  Lab 04/18/20 0756  WBC 12.1*  HGB 15.7*  HCT 45.9  MCV 83.2  PLT 401*   Cardiac Enzymes: No results for input(s): CKTOTAL, CKMB, CKMBINDEX, TROPONINI in the last 168 hours. BNP: BNP (last 3 results) Recent Labs    10/07/19 1509  BNP 61.0    ProBNP (last 3 results) No results for input(s): PROBNP in the last 8760 hours.  CBG: Recent Labs  Lab 04/20/20 0040 04/20/20 0423 04/20/20 0902 04/20/20 1140 04/20/20 1203   GLUCAP 148* 178* 71 64* 89       Signed:  04/22/20 MD.  [facility] 04/20/2020, 1:11 PM

## 2020-04-20 NOTE — Progress Notes (Signed)
Patient is stable and ready for discharge home. Patient's IVs removed. Writer went over discharge paperwork and she verbalized understanding and had no questions. Patient packed her belongings herself and independent able to dress herself. Patient transported via [facility] to her private car to go home.

## 2020-04-20 NOTE — Progress Notes (Signed)
NT reported patient's BS 54, gave apple juice and BS 89. MD notified.

## 2020-04-21 LAB — BLOOD GAS, VENOUS
Acid-base deficit: 10.2 mmol/L — ABNORMAL HIGH (ref 0.0–2.0)
Bicarbonate: 16.2 mmol/L — ABNORMAL LOW (ref 20.0–28.0)
O2 Saturation: 38.6 %
Patient temperature: 37
pCO2, Ven: 37 mmHg — ABNORMAL LOW (ref 44.0–60.0)
pH, Ven: 7.25 (ref 7.250–7.430)

## 2020-04-21 LAB — GLUCOSE, CAPILLARY: Glucose-Capillary: 266 mg/dL — ABNORMAL HIGH (ref 70–99)

## 2021-02-10 ENCOUNTER — Inpatient Hospital Stay
Admission: EM | Admit: 2021-02-10 | Discharge: 2021-02-12 | DRG: 638 | Disposition: A | Discharge status: Home / Self Care | Payer: Medicaid Other | Attending: Internal Medicine | Admitting: Internal Medicine

## 2021-02-10 ENCOUNTER — Other Ambulatory Visit: Payer: Self-pay

## 2021-02-10 DIAGNOSIS — N390 Urinary tract infection, site not specified: Secondary | ICD-10-CM | POA: Diagnosis present

## 2021-02-10 DIAGNOSIS — N049 Nephrotic syndrome with unspecified morphologic changes: Secondary | ICD-10-CM

## 2021-02-10 DIAGNOSIS — I10 Essential (primary) hypertension: Secondary | ICD-10-CM

## 2021-02-10 DIAGNOSIS — E1110 Type 2 diabetes mellitus with ketoacidosis without coma: Secondary | ICD-10-CM | POA: Diagnosis present

## 2021-02-10 DIAGNOSIS — E1010 Type 1 diabetes mellitus with ketoacidosis without coma: Secondary | ICD-10-CM | POA: Diagnosis not present

## 2021-02-10 DIAGNOSIS — E876 Hypokalemia: Secondary | ICD-10-CM | POA: Diagnosis present

## 2021-02-10 DIAGNOSIS — R109 Unspecified abdominal pain: Secondary | ICD-10-CM

## 2021-02-10 DIAGNOSIS — Z87441 Personal history of nephrotic syndrome: Secondary | ICD-10-CM

## 2021-02-10 DIAGNOSIS — R809 Proteinuria, unspecified: Secondary | ICD-10-CM | POA: Diagnosis present

## 2021-02-10 DIAGNOSIS — Z20822 Contact with and (suspected) exposure to covid-19: Secondary | ICD-10-CM | POA: Diagnosis present

## 2021-02-10 DIAGNOSIS — Z794 Long term (current) use of insulin: Secondary | ICD-10-CM

## 2021-02-10 DIAGNOSIS — Z79899 Other long term (current) drug therapy: Secondary | ICD-10-CM

## 2021-02-10 DIAGNOSIS — E1310 Other specified diabetes mellitus with ketoacidosis without coma: Secondary | ICD-10-CM

## 2021-02-10 LAB — RESP PANEL BY RT-PCR (FLU A&B, COVID) ARPGX2
Influenza A by PCR: NEGATIVE
Influenza B by PCR: NEGATIVE
SARS Coronavirus 2 by RT PCR: NEGATIVE

## 2021-02-10 LAB — BASIC METABOLIC PANEL
Anion gap: 24 — ABNORMAL HIGH (ref 5–15)
Anion gap: 24 — ABNORMAL HIGH (ref 5–15)
BUN: 26 mg/dL — ABNORMAL HIGH (ref 6–20)
BUN: 26 mg/dL — ABNORMAL HIGH (ref 6–20)
CO2: 13 mmol/L — ABNORMAL LOW (ref 22–32)
CO2: 10 mmol/L — ABNORMAL LOW (ref 22–32)
Calcium: 10.4 mg/dL — ABNORMAL HIGH (ref 8.9–10.3)
Calcium: 10.4 mg/dL — ABNORMAL HIGH (ref 8.9–10.3)
Chloride: 97 mmol/L — ABNORMAL LOW (ref 98–111)
Chloride: 98 mmol/L (ref 98–111)
Creatinine, Ser: 1.4 mg/dL — ABNORMAL HIGH (ref 0.44–1.00)
Creatinine, Ser: 1.28 mg/dL — ABNORMAL HIGH (ref 0.44–1.00)
GFR, Estimated: 55 mL/min — ABNORMAL LOW (ref >60)
GFR, Estimated: >60 mL/min (ref >60)
Glucose, Bld: 181 mg/dL — ABNORMAL HIGH (ref 70–99)
Glucose, Bld: 108 mg/dL — ABNORMAL HIGH (ref 70–99)
Potassium: 4.6 mmol/L (ref 3.5–5.1)
Potassium: 5.1 mmol/L (ref 3.5–5.1)
Sodium: 134 mmol/L — ABNORMAL LOW (ref 135–145)
Sodium: 132 mmol/L — ABNORMAL LOW (ref 135–145)

## 2021-02-10 LAB — CBC
HCT: 48.6 % — ABNORMAL HIGH (ref 36.0–46.0)
Hemoglobin: 16.4 g/dL — ABNORMAL HIGH (ref 12.0–15.0)
MCH: 30.4 pg (ref 26.0–34.0)
MCHC: 33.7 g/dL (ref 30.0–36.0)
MCV: 90 fL (ref 80.0–100.0)
Platelets: 488 10*3/uL — ABNORMAL HIGH (ref 150–400)
RBC: 5.4 MIL/uL — ABNORMAL HIGH (ref 3.87–5.11)
RDW: 14.1 % (ref 11.5–15.5)
WBC: 12.6 10*3/uL — ABNORMAL HIGH (ref 4.0–10.5)
nRBC: 0 % (ref 0.0–0.2)

## 2021-02-10 LAB — BLOOD GAS, VENOUS
Acid-base deficit: 15 mmol/L — ABNORMAL HIGH (ref 0.0–2.0)
Bicarbonate: 11.1 mmol/L — ABNORMAL LOW (ref 20.0–28.0)
O2 Saturation: 68.6 %
Patient temperature: 37
pCO2, Ven: 27 mmHg — ABNORMAL LOW (ref 44.0–60.0)
pH, Ven: 7.22 — ABNORMAL LOW (ref 7.250–7.430)
pO2, Ven: 44 mmHg (ref 32.0–45.0)

## 2021-02-10 LAB — CBG MONITORING, ED
Glucose-Capillary: 131 mg/dL — ABNORMAL HIGH (ref 70–99)
Glucose-Capillary: 136 mg/dL — ABNORMAL HIGH (ref 70–99)
Glucose-Capillary: 154 mg/dL — ABNORMAL HIGH (ref 70–99)
Glucose-Capillary: 156 mg/dL — ABNORMAL HIGH (ref 70–99)
Glucose-Capillary: 192 mg/dL — ABNORMAL HIGH (ref 70–99)
Glucose-Capillary: 203 mg/dL — ABNORMAL HIGH (ref 70–99)
Glucose-Capillary: 207 mg/dL — ABNORMAL HIGH (ref 70–99)

## 2021-02-10 LAB — BETA-HYDROXYBUTYRIC ACID: Beta-Hydroxybutyric Acid: 6.57 mmol/L — ABNORMAL HIGH (ref 0.05–0.27)

## 2021-02-10 MED ORDER — LACTATED RINGERS IV BOLUS
1000.0000 mL | Freq: Once | INTRAVENOUS | Status: AC
  Administered 2021-02-10: 1000 mL via INTRAVENOUS

## 2021-02-10 MED ORDER — LACTATED RINGERS IV SOLN: INTRAVENOUS | Status: DC

## 2021-02-10 MED ORDER — ONDANSETRON HCL 4 MG/2ML IJ SOLN: 4.0000 mg | Freq: Four times a day (QID) | INTRAMUSCULAR | Status: DC | PRN

## 2021-02-10 MED ORDER — POTASSIUM CHLORIDE 10 MEQ/100ML IV SOLN
10.0000 meq | INTRAVENOUS | Status: AC
  Administered 2021-02-10: 10 meq via INTRAVENOUS
  Filled 2021-02-10: qty 100

## 2021-02-10 MED ORDER — LACTATED RINGERS IV BOLUS: 20.0000 mL/kg | Freq: Once | INTRAVENOUS | Status: DC

## 2021-02-10 MED ORDER — DEXTROSE 50 % IV SOLN: 0.0000 mL | INTRAVENOUS | Status: DC | PRN

## 2021-02-10 MED ORDER — FENTANYL CITRATE (PF) 100 MCG/2ML IJ SOLN
50.0000 ug | Freq: Once | INTRAMUSCULAR | Status: AC
  Administered 2021-02-10: 50 ug via INTRAVENOUS
  Filled 2021-02-10: qty 2

## 2021-02-10 MED ORDER — ONDANSETRON HCL 4 MG/2ML IJ SOLN
4.0000 mg | Freq: Once | INTRAMUSCULAR | Status: AC
  Administered 2021-02-10: 4 mg via INTRAVENOUS
  Filled 2021-02-10: qty 2

## 2021-02-10 MED ORDER — INSULIN REGULAR(HUMAN) IN NACL 100-0.9 UT/100ML-% IV SOLN
INTRAVENOUS | Status: DC
  Administered 2021-02-10: 3 [IU]/h via INTRAVENOUS
  Filled 2021-02-10: qty 100

## 2021-02-10 MED ORDER — ENOXAPARIN SODIUM 40 MG/0.4ML IJ SOSY
40.0000 mg | PREFILLED_SYRINGE | INTRAMUSCULAR | Status: DC
  Administered 2021-02-10 – 2021-02-11 (×2): 40 mg via SUBCUTANEOUS
  Filled 2021-02-10 (×2): qty 0.4

## 2021-02-10 MED ORDER — TACROLIMUS 1 MG PO CAPS
2.0000 mg | ORAL_CAPSULE | Freq: Two times a day (BID) | ORAL | Status: DC
  Administered 2021-02-10 – 2021-02-12 (×4): 2 mg via ORAL
  Filled 2021-02-10 (×5): qty 2

## 2021-02-10 MED ORDER — SODIUM CHLORIDE 0.9 % IV SOLN
1.0000 g | Freq: Once | INTRAVENOUS | Status: AC
  Administered 2021-02-10: 1 g via INTRAVENOUS
  Filled 2021-02-10: qty 10

## 2021-02-10 MED ORDER — DEXTROSE IN LACTATED RINGERS 5 % IV SOLN: INTRAVENOUS | Status: DC

## 2021-02-10 NOTE — ED Notes (Signed)
Lab called to assist with new lab draw orders

## 2021-02-10 NOTE — ED Provider Notes (Signed)
[facility] Emergency Department Provider Note   ____________________________________________   I have reviewed the triage vital signs and the nursing notes.   HISTORY  Chief Complaint Hyperglycemia   History limited by: Not Limited   HPI Catherine [name] is a 20 y.o. female who presents to the emergency department today because of concern for possible DKA. She states that she started feeling off a few days ago. She then developed nausea. Because of this she has not been eating or drinking her normal amounts. She does have history of diabetes and has been in DKA in the past and the way she feels now reminds her of the way she felt with her DKA. She did adjust her insulin given that she was not eating as much. Checking her sugars at home it was over 200 two days ago. The patient denies any fevers.    Records reviewed. Per medical record review patient has a history of DKA.  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 No pertinent surgical history.  Prior to Admission medications   Medication Sig Start Date End Date Taking? Authorizing Provider  furosemide (LASIX) 20 MG tablet Take 1 tablet (20 mg total) by mouth daily as needed for edema. 08/16/19 08/15/20  Jene Every, MD  insulin aspart (NOVOLOG) 100 UNIT/ML injection Inject 5-8 Units into the  skin 3 (three) times daily before meals.    [provider]  LANTUS 100 UNIT/ML injection Inject 30 Units into the skin at bedtime.  01/27/20   [provider]  ondansetron (ZOFRAN ODT) 4 MG disintegrating tablet Take 1 tablet (4 mg total) by mouth every 6 (six) hours as needed for nausea or vomiting. Patient not taking: Reported on 04/18/2020 10/07/19   Sharyn Creamer, MD  predniSONE (DELTASONE) 20 MG tablet Take 20 mg by mouth daily as needed.  01/27/20   [provider]  tacrolimus (PROGRAF) 1 MG capsule Take 2 mg by mouth 2 (two) times daily.    [provider]    Allergies Patient has no known allergies.  No family history on file.  Social History Social History   Tobacco Use   Smoking status: Never   Smokeless tobacco: Never  Vaping Use   Vaping Use: Never used  Substance Use Topics   Alcohol use: Not Currently   Drug use: Not Currently    Review of Systems Constitutional: No fever/chills Eyes: No visual changes. ENT: No sore throat. Cardiovascular: Denies chest pain. Respiratory: Denies shortness of breath. Gastrointestinal: Positive for nausea.  Genitourinary: Negative for dysuria. Musculoskeletal: Negative for back pain. Skin: Negative for rash. Neurological: Negative for headaches, focal weakness or numbness.  ____________________________________________   PHYSICAL EXAM:  VITAL SIGNS: ED Triage Vitals  Enc Vitals Group     BP 02/10/21 1301 (!) 121/92     Pulse Rate 02/10/21 1301 (!) 108  Resp 02/10/21 1301 16     Temp 02/10/21 1301 97.6 F (36.4 C)     Temp Source 02/10/21 1301 Oral     SpO2 02/10/21 1301 98 %     Weight --      Height --      Head Circumference --      Peak Flow --      Pain Score 02/10/21 1253 5   Constitutional: Alert and oriented.  Eyes: Conjunctivae are normal.  ENT      Head: Normocephalic and atraumatic.      Nose: No congestion/rhinnorhea.      Mouth/Throat: Mucous membranes are moist.       Neck: No stridor. Hematological/Lymphatic/Immunilogical: No cervical lymphadenopathy. Cardiovascular: Tachycardic, regular rhythm.  No murmurs, rubs, or gallops.  Respiratory: Normal respiratory effort without tachypnea nor retractions. Breath sounds are clear and equal bilaterally. No wheezes/rales/rhonchi. Gastrointestinal: Soft and non tender. No rebound. No guarding.  Genitourinary: Deferred Musculoskeletal: Normal range of motion in all extremities. No lower extremity edema. Neurologic:  Normal speech and language. No gross focal neurologic deficits are appreciated.  Skin:  Skin is warm, dry and intact. No rash noted. Psychiatric: Mood and affect are normal. Speech and behavior are normal. Patient exhibits appropriate insight and judgment.  ____________________________________________    LABS (pertinent positives/negatives)  CBC wbc 12.6, hgb 16.4, plt 488 BMP na 134, k 4.6, glu 181, cr 1.40, anion gap 24 VBG pH 7.22 Beta hydroxybutyric acid 6.57 ____________________________________________   EKG  None  ____________________________________________    RADIOLOGY  None  ____________________________________________   PROCEDURES  Procedures  CRITICAL CARE Performed by: Phineas Semen   Total critical care time: 30 minutes  Critical care time was exclusive of separately billable procedures and treating other patients.  Critical care was necessary to treat or prevent imminent or life-threatening deterioration.  Critical care was time spent personally by me on the following activities: development of treatment plan with patient and/or surrogate as well as nursing, discussions with consultants, evaluation of patient's response to treatment, examination of patient, obtaining history from patient or surrogate, ordering and performing treatments and interventions, ordering and review of laboratory studies, ordering and review of radiographic studies, pulse oximetry  and re-evaluation of patient's condition.  ____________________________________________   INITIAL IMPRESSION / ASSESSMENT AND PLAN / ED COURSE  Pertinent labs & imaging results that were available during my care of the patient were reviewed by me and considered in my medical decision making (see chart for details).   Patient presented to the emergency department today with concerns for possible DKA.  Patient has a history of diabetes and has had DKA in the past stated that this reminded for her as she fell in the past.  Work-up here is consistent with DKA.  Patient with large anion gap, ketones in her blood and pH of 7.22.  Will plan on admission to the hospital service. Will start insulin drip.   ____________________________________________   FINAL CLINICAL IMPRESSION(S) / ED DIAGNOSES  Final diagnoses:  Diabetic ketoacidosis without coma associated with other specified diabetes mellitus (HCC)     Note: This dictation was prepared with Dragon dictation. Any transcriptional errors that result from this process are unintentional     Phineas Semen, MD 02/10/21 1940

## 2021-02-10 NOTE — H&P (Signed)
History and Physical    [name]:[id number] DOB: 10/14/2000 DOA: 02/10/2021  PCP: [facility]  Patient coming from: Home , female friend at bedside  I have personally briefly reviewed patient's old medical records in [facility]  Chief Complaint: Nausea,vomiting  HPI: Catherine [name] is a 20 y.o. female with medical history significant for type 1 diabetes, focal and segmental glomerular nephrotic syndrome, and hypertension who presents with concerns of nausea and vomiting.  About 4 days ago she was getting her hair done at a salon when she recently had acute onset diaphoresis, dizziness and blurry vision.  Improved some when they tried to turn on the fan.  Checked her sugar and it was around 200.  She continued to have some symptoms of dizziness the following day with her glucose around 300.  Then progressively throughout the week she developed nausea, vomiting and lower abdominal pain.  The past 2 days has also felt lower back pain.  Denies any fever.  No dysuria, increased urgency or frequency.  No hematuria.  She continues to take tacrolimus and is followed by [facility] nephrology for her nephrotic syndrome. Patient has on a Nexplanon does not think she could be pregnant.   ED Course: She was afebrile, normotensive.  pH of 7.22, CO2 27, bicarb 11, blood glucose of 181, beta hydroxybutyrate of 6.5 WBC of 12.6, hemoglobin of 16.4, platelet of 488 Sodium of 132, potassium 5.1  Patient was initiated on insulin infusion and hospitalist was called for admission for DKA.  Review of Systems: Constitutional: No Weight Change, No Fever ENT/Mouth: No sore throat, No Rhinorrhea Eyes: No Eye Pain, No Vision Changes Cardiovascular: No Chest Pain, no SOB, Respiratory: No Cough, No Sputum Gastrointestinal: + Nausea, + Vomiting, No Diarrhea, No Constipation, + Pain Genitourinary: no Urinary Incontinence, No Urgency, No Flank Pain Musculoskeletal: No  Arthralgias, No Myalgias Skin: No Skin Lesions, No Pruritus, Neuro: no Weakness, No Numbness Psych: No Anxiety/Panic, No Depression, no decrease appetite Heme/Lymph: No Bruising, No Bleeding  Past Medical History:  Diagnosis Date   Diabetes mellitus without complication (HCC)    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 No pertinent surgical history.   reports that she has never smoked. She has never used smokeless tobacco. She reports that she does not currently use alcohol. She reports that she does not currently use drugs. Social History  No Known Allergies  No family history on file.   Prior to Admission medications   Medication Sig Start Date End Date Taking? Authorizing Provider  furosemide (LASIX) 20 MG tablet Take 1 tablet (20 mg total) by mouth daily as needed for edema. 08/16/19 08/15/20  Jene Every, MD  insulin aspart (NOVOLOG) 100 UNIT/ML injection Inject 5-8 Units into the skin 3 (three) times daily before meals.    [provider]  LANTUS 100 UNIT/ML injection Inject 30 Units into the skin at bedtime.  01/27/20   [provider]  ondansetron (ZOFRAN ODT) 4 MG disintegrating tablet Take 1 tablet (4 mg total) by mouth every 6 (six) hours as needed for nausea or vomiting. Patient not taking: Reported on 04/18/2020 10/07/19   Sharyn Creamer, MD  predniSONE (DELTASONE) 20 MG tablet Take 20 mg by mouth daily as needed.  01/27/20   [provider]  tacrolimus (PROGRAF) 1 MG capsule Take 2 mg by mouth 2 (two) times daily.    [provider]  Physical Exam: Vitals:   02/10/21 1301 02/10/21 1606 02/10/21 1730 02/10/21 1830  BP: (!) 121/92 113/89 (!) 129/96 108/75  Pulse: (!) 108 100 86 84  Resp: 16 20 16 17   Temp: 97.6 F (36.4 C)     TempSrc: Oral     SpO2: 98% 100% 100% 100%    Constitutional: NAD, calm, comfortable, thin young female laying flat in bed Vitals:    02/10/21 1301 02/10/21 1606 02/10/21 1730 02/10/21 1830  BP: (!) 121/92 113/89 (!) 129/96 108/75  Pulse: (!) 108 100 86 84  Resp: 16 20 16 17   Temp: 97.6 F (36.4 C)     TempSrc: Oral     SpO2: 98% 100% 100% 100%   Eyes: PERRL, lids and conjunctivae normal ENMT: Mucous membranes are moist.  Neck: normal, supple Respiratory: clear to auscultation bilaterally, no wheezing, no crackles. Normal respiratory effort. No accessory muscle use.  Cardiovascular: Regular rate and rhythm, no murmurs / rubs / gallops. No extremity edema.  Abdomen: Suprapubic tenderness tenderness, no masses palpated.  No CVA tenderness. bowel sounds positive.  Musculoskeletal: no clubbing / cyanosis. No joint deformity upper and lower extremities. Good ROM, no contractures. Normal muscle tone.  Skin: no rashes, lesions, ulcers. No induration Neurologic: CN 2-12 grossly intact. Sensation intact. Strength 5/5 in all 4.  Psychiatric: Normal judgment and insight. Alert and oriented x 3. Normal mood.    Labs on Admission: I have personally reviewed following labs and imaging studies  CBC: Recent Labs  Lab 02/10/21 1302  WBC 12.6*  HGB 16.4*  HCT 48.6*  MCV 90.0  PLT 488*   Basic Metabolic Panel: Recent Labs  Lab 02/10/21 1302 02/10/21 1553  NA 134* 132*  K 4.6 5.1  CL 97* 98  CO2 13* 10*  GLUCOSE 181* 108*  BUN 26* 26*  CREATININE 1.40* 1.28*  CALCIUM 10.4* 10.4*   GFR: CrCl cannot be calculated (Unknown ideal weight.). Liver Function Tests: No results for input(s): AST, ALT, ALKPHOS, BILITOT, PROT, ALBUMIN in the last 168 hours. No results for input(s): LIPASE, AMYLASE in the last 168 hours. No results for input(s): AMMONIA in the last 168 hours. Coagulation Profile: No results for input(s): INR, PROTIME in the last 168 hours. Cardiac Enzymes: No results for input(s): CKTOTAL, CKMB, CKMBINDEX, TROPONINI in the last 168 hours. BNP (last 3 results) No results for input(s): PROBNP in the last  8760 hours. HbA1C: No results for input(s): HGBA1C in the last 72 hours. CBG: Recent Labs  Lab 02/10/21 1306 02/10/21 1626 02/10/21 1850 02/10/21 2038  GLUCAP 203* 136* 207* 192*   Lipid Profile: No results for input(s): CHOL, HDL, LDLCALC, TRIG, CHOLHDL, LDLDIRECT in the last 72 hours. Thyroid Function Tests: No results for input(s): TSH, T4TOTAL, FREET4, T3FREE, THYROIDAB in the last 72 hours. Anemia Panel: No results for input(s): VITAMINB12, FOLATE, FERRITIN, TIBC, IRON, RETICCTPCT in the last 72 hours. Urine analysis:    Component Value Date/Time   COLORURINE YELLOW (A) 04/18/2020 0951   APPEARANCEUR HAZY (A) 04/18/2020 0951   LABSPEC 1.015 04/18/2020 0951   PHURINE 6.0 04/18/2020 0951   GLUCOSEU >=500 (A) 04/18/2020 0951   HGBUR NEGATIVE 04/18/2020 0951   BILIRUBINUR NEGATIVE 04/18/2020 0951   KETONESUR 80 (A) 04/18/2020 0951   PROTEINUR 100 (A) 04/18/2020 0951   NITRITE NEGATIVE 04/18/2020 0951   LEUKOCYTESUR NEGATIVE 04/18/2020 0951    Radiological Exams on Admission: No results found.    Assessment/Plan  DKA w/hx of Type 1 DM -pt normally on  30 units Lantus qHS and correction sliding scale Novolog. Seems she is chronically uncontrolled -obtain HbA1C - replete potassium as needed  - continue insulin gtt with goal of 140-180 and AG <12 - IV NS until BG <250, then switch to D5 1/2 NS  - BMP q4hr  - keep NPO  UTI -pt with symptom of suprapubic pain and possible flank pain -pending UA but will give Rocephin empirically -obtain renal ultrasound  Nephrotic syndrome -pending UA  -continue Tacrolimus  HTN -controlled not on antihypertensives   DVT prophylaxis:.Lovenox Code Status: Full Family Communication: Plan discussed with patient at bedside  disposition Plan: Home with observation Consults called:  Admission status: Observation  Level of care: Stepdown  Status is: Observation  The patient remains OBS appropriate and will d/c before 2  midnights.  Dispo: The patient is from: Home              Anticipated d/c is to: Home              Patient currently is not medically stable to d/c.   Difficult to place patient No         Anselm Jungling DO [facility]   If 7PM-7AM, please contact night-coverage [email or web address]   02/10/2021, 8:49 PM

## 2021-02-10 NOTE — ED Notes (Signed)
Lab called to disregard lab draw, IV placed with blood draw.

## 2021-02-10 NOTE — ED Triage Notes (Signed)
Pt comes with c/o N/V for about 3 days. Pt states she thinks she might be in DKA. Pt states BS in 300s.

## 2021-02-10 NOTE — ED Notes (Signed)
Pt care taken, pt on insulin drip and said that her pain is much better

## 2021-02-11 ENCOUNTER — Observation Stay: Payer: Medicaid Other

## 2021-02-11 ENCOUNTER — Encounter: Payer: Self-pay | Admitting: Internal Medicine

## 2021-02-11 DIAGNOSIS — Z79899 Other long term (current) drug therapy: Secondary | ICD-10-CM | POA: Diagnosis not present

## 2021-02-11 DIAGNOSIS — E1110 Type 2 diabetes mellitus with ketoacidosis without coma: Secondary | ICD-10-CM | POA: Diagnosis present

## 2021-02-11 DIAGNOSIS — R809 Proteinuria, unspecified: Secondary | ICD-10-CM | POA: Diagnosis present

## 2021-02-11 DIAGNOSIS — E876 Hypokalemia: Secondary | ICD-10-CM | POA: Diagnosis present

## 2021-02-11 DIAGNOSIS — N049 Nephrotic syndrome with unspecified morphologic changes: Secondary | ICD-10-CM | POA: Diagnosis not present

## 2021-02-11 DIAGNOSIS — I10 Essential (primary) hypertension: Secondary | ICD-10-CM | POA: Diagnosis present

## 2021-02-11 DIAGNOSIS — Z794 Long term (current) use of insulin: Secondary | ICD-10-CM | POA: Diagnosis not present

## 2021-02-11 DIAGNOSIS — E1010 Type 1 diabetes mellitus with ketoacidosis without coma: Secondary | ICD-10-CM | POA: Diagnosis not present

## 2021-02-11 DIAGNOSIS — N390 Urinary tract infection, site not specified: Secondary | ICD-10-CM | POA: Diagnosis present

## 2021-02-11 DIAGNOSIS — Z20822 Contact with and (suspected) exposure to covid-19: Secondary | ICD-10-CM | POA: Diagnosis present

## 2021-02-11 DIAGNOSIS — Z87441 Personal history of nephrotic syndrome: Secondary | ICD-10-CM | POA: Diagnosis not present

## 2021-02-11 LAB — URINALYSIS, ROUTINE W REFLEX MICROSCOPIC
Bacteria, UA: NONE SEEN
Bilirubin Urine: NEGATIVE
Glucose, UA: >=500 mg/dL — AB
Ketones, ur: 80 mg/dL — AB
Leukocytes,Ua: NEGATIVE
Nitrite: NEGATIVE
Protein, ur: >=300 mg/dL — AB
Specific Gravity, Urine: 1.02 (ref 1.005–1.030)
pH: 5 (ref 5.0–8.0)

## 2021-02-11 LAB — CBG MONITORING, ED
Glucose-Capillary: 168 mg/dL — ABNORMAL HIGH (ref 70–99)
Glucose-Capillary: 169 mg/dL — ABNORMAL HIGH (ref 70–99)
Glucose-Capillary: 154 mg/dL — ABNORMAL HIGH (ref 70–99)
Glucose-Capillary: 149 mg/dL — ABNORMAL HIGH (ref 70–99)
Glucose-Capillary: 141 mg/dL — ABNORMAL HIGH (ref 70–99)
Glucose-Capillary: 188 mg/dL — ABNORMAL HIGH (ref 70–99)
Glucose-Capillary: 150 mg/dL — ABNORMAL HIGH (ref 70–99)
Glucose-Capillary: 597 mg/dL (ref 70–99)
Glucose-Capillary: 277 mg/dL — ABNORMAL HIGH (ref 70–99)
Glucose-Capillary: 227 mg/dL — ABNORMAL HIGH (ref 70–99)

## 2021-02-11 LAB — GLUCOSE, CAPILLARY
Glucose-Capillary: 366 mg/dL — ABNORMAL HIGH (ref 70–99)
Glucose-Capillary: 291 mg/dL — ABNORMAL HIGH (ref 70–99)

## 2021-02-11 LAB — BASIC METABOLIC PANEL
Anion gap: 15 (ref 5–15)
Anion gap: 9 (ref 5–15)
Anion gap: 10 (ref 5–15)
Anion gap: 9 (ref 5–15)
Anion gap: 11 (ref 5–15)
Anion gap: 9 (ref 5–15)
BUN: 18 mg/dL (ref 6–20)
BUN: 14 mg/dL (ref 6–20)
BUN: 12 mg/dL (ref 6–20)
BUN: 11 mg/dL (ref 6–20)
BUN: 17 mg/dL (ref 6–20)
BUN: 21 mg/dL — ABNORMAL HIGH (ref 6–20)
CO2: 15 mmol/L — ABNORMAL LOW (ref 22–32)
CO2: 20 mmol/L — ABNORMAL LOW (ref 22–32)
CO2: 20 mmol/L — ABNORMAL LOW (ref 22–32)
CO2: 20 mmol/L — ABNORMAL LOW (ref 22–32)
CO2: 19 mmol/L — ABNORMAL LOW (ref 22–32)
CO2: 23 mmol/L (ref 22–32)
Calcium: 9.1 mg/dL (ref 8.9–10.3)
Calcium: 9 mg/dL (ref 8.9–10.3)
Calcium: 9 mg/dL (ref 8.9–10.3)
Calcium: 8.8 mg/dL — ABNORMAL LOW (ref 8.9–10.3)
Calcium: 8.8 mg/dL — ABNORMAL LOW (ref 8.9–10.3)
Calcium: 8.9 mg/dL (ref 8.9–10.3)
Chloride: 103 mmol/L (ref 98–111)
Chloride: 107 mmol/L (ref 98–111)
Chloride: 106 mmol/L (ref 98–111)
Chloride: 107 mmol/L (ref 98–111)
Chloride: 106 mmol/L (ref 98–111)
Chloride: 105 mmol/L (ref 98–111)
Creatinine, Ser: 0.89 mg/dL (ref 0.44–1.00)
Creatinine, Ser: 0.85 mg/dL (ref 0.44–1.00)
Creatinine, Ser: 0.79 mg/dL (ref 0.44–1.00)
Creatinine, Ser: 1.05 mg/dL — ABNORMAL HIGH (ref 0.44–1.00)
Creatinine, Ser: 0.96 mg/dL (ref 0.44–1.00)
Creatinine, Ser: 0.93 mg/dL (ref 0.44–1.00)
GFR, Estimated: >60 mL/min (ref >60)
GFR, Estimated: >60 mL/min (ref >60)
GFR, Estimated: >60 mL/min (ref >60)
GFR, Estimated: >60 mL/min (ref >60)
GFR, Estimated: >60 mL/min (ref >60)
GFR, Estimated: >60 mL/min (ref >60)
Glucose, Bld: 167 mg/dL — ABNORMAL HIGH (ref 70–99)
Glucose, Bld: 156 mg/dL — ABNORMAL HIGH (ref 70–99)
Glucose, Bld: 285 mg/dL — ABNORMAL HIGH (ref 70–99)
Glucose, Bld: 166 mg/dL — ABNORMAL HIGH (ref 70–99)
Glucose, Bld: 358 mg/dL — ABNORMAL HIGH (ref 70–99)
Glucose, Bld: 288 mg/dL — ABNORMAL HIGH (ref 70–99)
Potassium: 3.6 mmol/L (ref 3.5–5.1)
Potassium: 3.6 mmol/L (ref 3.5–5.1)
Potassium: 3.4 mmol/L — ABNORMAL LOW (ref 3.5–5.1)
Potassium: 3.8 mmol/L (ref 3.5–5.1)
Potassium: 4.1 mmol/L (ref 3.5–5.1)
Potassium: 4.1 mmol/L (ref 3.5–5.1)
Sodium: 133 mmol/L — ABNORMAL LOW (ref 135–145)
Sodium: 136 mmol/L (ref 135–145)
Sodium: 136 mmol/L (ref 135–145)
Sodium: 136 mmol/L (ref 135–145)
Sodium: 136 mmol/L (ref 135–145)
Sodium: 137 mmol/L (ref 135–145)

## 2021-02-11 LAB — HEMOGLOBIN A1C
Hgb A1c MFr Bld: 11.9 % — ABNORMAL HIGH (ref 4.8–5.6)
Mean Plasma Glucose: 294.83 mg/dL

## 2021-02-11 LAB — BETA-HYDROXYBUTYRIC ACID
Beta-Hydroxybutyric Acid: 4.4 mmol/L — ABNORMAL HIGH (ref 0.05–0.27)
Beta-Hydroxybutyric Acid: 0.9 mmol/L — ABNORMAL HIGH (ref 0.05–0.27)

## 2021-02-11 LAB — HIV ANTIBODY (ROUTINE TESTING W REFLEX): HIV Screen 4th Generation wRfx: NONREACTIVE

## 2021-02-11 MED ORDER — POTASSIUM CHLORIDE 10 MEQ/100ML IV SOLN
10.0000 meq | INTRAVENOUS | Status: AC
  Administered 2021-02-11 (×2): 10 meq via INTRAVENOUS
  Filled 2021-02-11 (×2): qty 100

## 2021-02-11 MED ORDER — POTASSIUM CHLORIDE CRYS ER 20 MEQ PO TBCR
40.0000 meq | EXTENDED_RELEASE_TABLET | Freq: Once | ORAL | Status: AC
  Administered 2021-02-11: 40 meq via ORAL
  Filled 2021-02-11: qty 2

## 2021-02-11 MED ORDER — INSULIN ASPART 100 UNIT/ML IJ SOLN
0.0000 [IU] | Freq: Three times a day (TID) | INTRAMUSCULAR | Status: DC
  Administered 2021-02-11: 3 [IU] via SUBCUTANEOUS
  Administered 2021-02-11: 9 [IU] via SUBCUTANEOUS
  Administered 2021-02-12: 5 [IU] via SUBCUTANEOUS
  Administered 2021-02-12: 1 [IU] via SUBCUTANEOUS
  Filled 2021-02-11 (×5): qty 1

## 2021-02-11 MED ORDER — INSULIN ASPART 100 UNIT/ML IJ SOLN
0.0000 [IU] | Freq: Every day | INTRAMUSCULAR | Status: DC
  Administered 2021-02-11: 3 [IU] via SUBCUTANEOUS
  Filled 2021-02-11: qty 1

## 2021-02-11 MED ORDER — INSULIN ASPART 100 UNIT/ML IJ SOLN
5.0000 [IU] | Freq: Three times a day (TID) | INTRAMUSCULAR | Status: DC
  Administered 2021-02-11 – 2021-02-12 (×3): 5 [IU] via SUBCUTANEOUS
  Filled 2021-02-11 (×3): qty 1

## 2021-02-11 MED ORDER — INSULIN GLARGINE-YFGN 100 UNIT/ML ~~LOC~~ SOLN
15.0000 [IU] | Freq: Two times a day (BID) | SUBCUTANEOUS | Status: DC
  Administered 2021-02-11 – 2021-02-12 (×3): 15 [IU] via SUBCUTANEOUS
  Filled 2021-02-11 (×4): qty 0.15

## 2021-02-11 MED ORDER — INSULIN ASPART 100 UNIT/ML IJ SOLN
3.0000 [IU] | Freq: Three times a day (TID) | INTRAMUSCULAR | Status: DC
  Administered 2021-02-11: 3 [IU] via SUBCUTANEOUS
  Filled 2021-02-11: qty 1

## 2021-02-11 NOTE — ED Notes (Signed)
Pt resting waiting on a bed assignment

## 2021-02-11 NOTE — Progress Notes (Signed)
PROGRESS NOTE    ALLIENE KLUGH  EVO:[id number] DOB: 14-Jul-2000 DOA: 02/10/2021 PCP: [facility]   Brief Narrative: Taken from H&P. Swetha LAURIEANNE GALLOWAY is a 20 y.o. female with medical history significant for type 1 diabetes, focal and segmental glomerular nephrotic syndrome, and hypertension who presents with concerns of nausea and vomiting. Admitted for DKA and started on Endo tool. She was switched to basal and short-acting this morning once gap was closed x2.  Subjective: Patient was feeling little improved when seen this morning.  Continue to have some abdominal pain.  No more nausea or vomiting.  Able to tolerate diet.  Assessment & Plan:   Principal Problem:   DKA (diabetic ketoacidosis) (HCC) Active Problems:   Nephrotic syndrome   HTN (hypertension)  DKA with type 1 diabetes mellitus.  Gap closed x2.  Bicarb improved but remained little acidotic at 20. -We will switch to basal and short-acting. -Continue with IV fluid as she is still little acidotic. -Continue to monitor. -Advised her to talk with her endocrinologist regarding getting insulin pump and a continuous glucose monitor.  Hypokalemia. -Replete potassium and monitor  History of nephrotic syndrome.  Being managed by nephrology. UA with significant proteinuria. -Continue home dose of tacrolimus  Hypertension.  Not on any antihypertensives at home now. Blood pressure within goal -Continue to monitor  Objective: Vitals:   02/11/21 1030 02/11/21 1100 02/11/21 1230 02/11/21 1440  BP: 124/88 122/82 112/75   Pulse: (!) 103 100 98   Resp: 18 17 17    Temp:    98.5 F (36.9 C)  TempSrc:    Oral  SpO2: 93% 96% 100%    No intake or output data in the 24 hours ending 02/11/21 1615 There were no vitals filed for this visit.  Examination:  General exam: Appears calm and comfortable  Respiratory system: Clear to auscultation. Respiratory effort normal. Cardiovascular system: S1 &  S2 heard, RRR.  Gastrointestinal system: Soft, nontender, nondistended, bowel sounds positive. Central nervous system: Alert and oriented. No focal neurological deficits.Symmetric 5 x 5 power. Extremities: No edema, no cyanosis, pulses intact and symmetrical. Psychiatry: Judgement and insight appear normal. Mood & affect appropriate.    DVT prophylaxis:  Code Status:  Family Communication:  Disposition Plan:  Status is: In patient  The patient will require care spanning > 2 midnights and should be moved to inpatient because: Inpatient level of care appropriate due to severity of illness  Dispo: The patient is from: Home              Anticipated d/c is to: Home              Patient currently is not medically stable to d/c.   Difficult to place patient No             Level of care: Med-Surg  All the records are reviewed and case discussed with Care Management/Social Worker. Management plans discussed with the patient, nursing and they are in agreement.  Consultants:  None  Procedures:  Antimicrobials:   Data Reviewed: I have personally reviewed following labs and imaging studies  CBC: Recent Labs  Lab 02/10/21 1302  WBC 12.6*  HGB 16.4*  HCT 48.6*  MCV 90.0  PLT 488*   Basic Metabolic Panel: Recent Labs  Lab 02/10/21 1553 02/11/21 0029 02/11/21 0549 02/11/21 0856 02/11/21 1246  NA 132* 133* 136 136 136  K 5.1 3.6 3.6 3.4* 3.8  CL 98 103 107 106 107  CO2  10* 15* 20* 20* 20*  GLUCOSE 108* 167* 156* 285* 166*  BUN 26* 18 14 12 11   CREATININE 1.28* 0.89 0.85 0.79 1.05*  CALCIUM 10.4* 9.1 9.0 9.0 8.8*   GFR: CrCl cannot be calculated (Unknown ideal weight.). Liver Function Tests: No results for input(s): AST, ALT, ALKPHOS, BILITOT, PROT, ALBUMIN in the last 168 hours. No results for input(s): LIPASE, AMYLASE in the last 168 hours. No results for input(s): AMMONIA in the last 168 hours. Coagulation Profile: No results for input(s): INR, PROTIME in the last  168 hours. Cardiac Enzymes: No results for input(s): CKTOTAL, CKMB, CKMBINDEX, TROPONINI in the last 168 hours. BNP (last 3 results) No results for input(s): PROBNP in the last 8760 hours. HbA1C: No results for input(s): HGBA1C in the last 72 hours. CBG: Recent Labs  Lab 02/11/21 0734 02/11/21 0852 02/11/21 0854 02/11/21 1035 02/11/21 1221  GLUCAP 188* 597* 150* 277* 227*   Lipid Profile: No results for input(s): CHOL, HDL, LDLCALC, TRIG, CHOLHDL, LDLDIRECT in the last 72 hours. Thyroid Function Tests: No results for input(s): TSH, T4TOTAL, FREET4, T3FREE, THYROIDAB in the last 72 hours. Anemia Panel: No results for input(s): VITAMINB12, FOLATE, FERRITIN, TIBC, IRON, RETICCTPCT in the last 72 hours. Sepsis Labs: No results for input(s): PROCALCITON, LATICACIDVEN in the last 168 hours.  Recent Results (from the past 240 hour(s))  Resp Panel by RT-PCR (Flu A&B, Covid) Nasopharyngeal Swab     Status: None   Collection Time: 02/10/21  5:32 PM   Specimen: Nasopharyngeal Swab; Nasopharyngeal(NP) swabs in vial transport medium  Result Value Ref Range Status   SARS Coronavirus 2 by RT PCR NEGATIVE NEGATIVE Final    Comment: (NOTE) SARS-CoV-2 target nucleic acids are NOT DETECTED.  The SARS-CoV-2 RNA is generally detectable in upper respiratory specimens during the acute phase of infection. The lowest concentration of SARS-CoV-2 viral copies this assay can detect is 138 copies/mL. A negative result does not preclude SARS-Cov-2 infection and should not be used as the sole basis for treatment or other patient management decisions. A negative result may occur with  improper specimen collection/handling, submission of specimen other than nasopharyngeal swab, presence of viral mutation(s) within the areas targeted by this assay, and inadequate number of viral copies(<138 copies/mL). A negative result must be combined with clinical observations, patient history, and  epidemiological information. The expected result is Negative.  Fact Sheet for Patients:  04/12/21  Fact Sheet for Healthcare Providers:  BloggerCourse.com  This test is no t yet approved or cleared by the [employer]iousBroker.[place] FDA and  has been authorized for detection and/or diagnosis of SARS-CoV-2 by FDA under an Emergency Use Authorization (EUA). This EUA will remain  in effect (meaning this test can be used) for the duration of the COVID-19 declaration under Section 564(b)(1) of the Act, 21 U.S.C.section 360bbb-3(b)(1), unless the authorization is terminated  or revoked sooner.       Influenza A by PCR NEGATIVE NEGATIVE Final   Influenza B by PCR NEGATIVE NEGATIVE Final    Comment: (NOTE) The Xpert Xpress SARS-CoV-2/FLU/RSV plus assay is intended as an aid in the diagnosis of influenza from Nasopharyngeal swab specimens and should not be used as a sole basis for treatment. Nasal washings and aspirates are unacceptable for Xpert Xpress SARS-CoV-2/FLU/RSV testing.  Fact Sheet for Patients: [place]  Fact Sheet for Healthcare Providers: BloggerCourse.com  This test is not yet approved or cleared by the SeriousBroker.it FDA and has been authorized for detection and/or diagnosis of SARS-CoV-2 by  FDA under an Emergency Use Authorization (EUA). This EUA will remain in effect (meaning this test can be used) for the duration of the COVID-19 declaration under Section 564(b)(1) of the Act, 21 U.S.C. section 360bbb-3(b)(1), unless the authorization is terminated or revoked.  Performed at [facility], [address] [place], [place] [address]      Radiology Studies: US RENAL  Result Date: 02/11/2021 CLINICAL DATA:  20 year old female with acute flank pain. EXAM: RENAL / URINARY TRACT ULTRASOUND COMPLETE COMPARISON:  CT Abdomen and Pelvis 10/07/2019. Abdomen  ultrasound 07/03/2017. FINDINGS: Right Kidney: Renal measurements: 10.2 x 4.8 x 5.2 cm = volume: 133 mL. Normal cortical echogenicity. Preserved corticomedullary differentiation, stable since the 2019 ultrasound (series 1, image 81 of that exam versus series 1, image 4 today). Mild prominence of the renal pelvis (image 8) appears stable from the CT last year. No convincing hydronephrosis. Left Kidney: Renal measurements: 9.7 x 6.5 x 5.8 cm = volume: 193 mL. Normal cortical echogenicity and corticomedullary differentiation. No hydronephrosis. Left kidney also appears stable. Bladder: Appears normal for degree of bladder distention. Both ureteral jets detected with Doppler. Other: None. IMPRESSION: Negative ultrasound appearance of both kidneys and the bladder. Electronically Signed   By: Odessa Fleming M.D.   On: 02/11/2021 04:41    Scheduled Meds:  enoxaparin (LOVENOX) injection  40 mg Subcutaneous Q24H   insulin aspart  0-5 Units Subcutaneous QHS   insulin aspart  0-9 Units Subcutaneous TID WC   insulin aspart  5 Units Subcutaneous TID WC   insulin glargine-yfgn  15 Units Subcutaneous BID   tacrolimus  2 mg Oral BID   Continuous Infusions:  dextrose 5% lactated ringers Stopped (02/11/21 1233)   insulin Stopped (02/11/21 1233)   lactated ringers 125 mL/hr at 02/11/21 1233     LOS: 0 days   Time spent: 35 minutes. More than 50% of the time was spent in counseling/coordination of care  Arnetha Courser, MD [facility]  If 7PM-7AM, please contact night-coverage [email or web address]  02/11/2021, 4:15 PM   This record has been created using Conservation officer, historic buildings. Errors have been sought and corrected,but may not always be located. Such creation errors do not reflect on the standard of care.

## 2021-02-11 NOTE — Progress Notes (Addendum)
Inpatient Diabetes Program Recommendations  AACE/ADA: New Consensus Statement on Inpatient Glycemic Control   Target Ranges:  Prepandial:   less than 140 mg/dL      Peak postprandial:   less than 180 mg/dL (1-2 hours)      Critically ill patients:  140 - 180 mg/dL  Results for Catherine [name], Catherine [name] (MRN [id number]) as of 02/11/2021 08:09  Ref. Range 02/10/2021 18:50 02/10/2021 20:38 02/10/2021 21:52 02/10/2021 22:50 02/10/2021 23:53 02/11/2021 00:53 02/11/2021 02:03 02/11/2021 03:29 02/11/2021 04:26 02/11/2021 05:44 02/11/2021 07:34  Glucose-Capillary Latest Ref Range: 70 - 99 mg/dL 782 (H) 956 (H) 213 (H) 156 (H) 131 (H) 168 (H) 169 (H) 154 (H) 149 (H) 141 (H) 188 (H)  Results for Catherine [name], Catherine [name] (MRN [id number]) as of 02/11/2021 08:09  Ref. Range 02/10/2021 13:02  CO2 Latest Ref Range: 22 - 32 mmol/L 13 (L)  Glucose Latest Ref Range: 70 - 99 mg/dL 629 (H)  Anion gap Latest Ref Range: 5 - 15  24 (H)  Results for Catherine [name], Catherine [name] (MRN [id number]) as of 02/11/2021 08:09  Ref. Range 02/10/2021 15:53 02/11/2021 00:29  Beta-Hydroxybutyric Acid Latest Ref Range: 0.05 - 0.27 mmol/L 6.57 (H) 4.40 (H)   Results for Catherine [name], Catherine [name] (MRN [id number]) as of 02/11/2021 08:09  Ref. Range 07/28/2019 11:11 04/18/2020 11:12  Hemoglobin A1C Latest Ref Range: 4.8 - 5.6 % 12.9 (H) 14.3 (H)   Review of Glycemic Control  Diabetes history: DM1 (makes NO insulin; requires basal, correction, and carbohydrate coverage insulin) Outpatient Diabetes medications: Lantus 30 units QHS, Novolog 5-8 units TID with meals Current orders for Inpatient glycemic control: IV insulin; transitioning to Semglee 15 units BID, Novolog 0-9 units TID, Novolog 0-5 units QHS, Novolog 3 units TID   Inpatient Diabetes Program Recommendations:    HbgA1C: Current A1C ordered this morning.  NOTE: Per chart, patient admitted with DKA and UTI with initial glucose of 181 mg/dl, CO2 13, AG 24, and Beta-hydroxybutyric acid 6.57. Patient was inpatient  04/18/20-04/20/20 for DKA and seen by inpatient diabetes coordinator at that time. Patient had reported that she was going to start seeing [facility] Endocrinology in December 2021 but no notes from [place] in Care Everywhere since then. Will plan to speak with patient today.  Addendum 02/11/21@13 :45-Spoke with patient at bedside. Patient reports being followed by [facility] Endocrinology for diabetes management and reports that she last seen Dr. Tedd Sias at the first of the year; however no office visit notes from Dr. Tedd Sias in Care Everywhere since 09/12/19 (initial consult).  Patient states that she is using Lantus 30 units QHS and Novolog 5-8 units TID with meals. Patient reports that glucose has been running better (reports 90-upper 100's mg/dl) until she started getting sick. She states that her glucose had started going up in the 200-300's since Sunday and she had been trying to check glucose more often and give herself more corrections to get it down. Patient reports that she has been using Dexcom CGM but notes she has not worn it in a week or so. Patient reports she has all needed supplies at home to resume Dexcom CGM.  Inquired about last A1C results and patient states that her last A1C was in 14% range last year when she was in the hospital; no A1C checked since then. Patient asked about current A1C; informed patient that A1C has been ordered and in process. Encouraged patient to ask nursing staff about results or check in MyChart for results. Discussed goal A1C and glucose. Discussed importance of  checking CBGs and maintaining good CBG control to prevent long-term and short-term complications. Explained how hyperglycemia leads to damage within blood vessels which lead to the common complications seen with uncontrolled diabetes. Stressed to the patient the importance of improving glycemic control to prevent further complications from uncontrolled diabetes. Asked patient to call [facility] Endocrinology and get an  appointment for follow up asap. Patient states she could use refill prescriptions for Lantus (vials), Novolog (vials), and syringes at time of discharge.  Patient verbalized understanding of information discussed and reports no further questions at this time related to diabetes. At time of discharge, please provide Rx for: Lantus vials (#30080), Novolog vials (#30076), and insulin syringes (#34051).  Thanks, Orlando Penner, RN, MSN, CDE Diabetes Coordinator Inpatient Diabetes Program [phone] (Team Pager from 8am to 5pm)

## 2021-02-12 LAB — BASIC METABOLIC PANEL
Anion gap: 9 (ref 5–15)
BUN: 18 mg/dL (ref 6–20)
CO2: 22 mmol/L (ref 22–32)
Calcium: 8.7 mg/dL — ABNORMAL LOW (ref 8.9–10.3)
Chloride: 111 mmol/L (ref 98–111)
Creatinine, Ser: 0.7 mg/dL (ref 0.44–1.00)
GFR, Estimated: >60 mL/min (ref >60)
Glucose, Bld: 119 mg/dL — ABNORMAL HIGH (ref 70–99)
Potassium: 3.1 mmol/L — ABNORMAL LOW (ref 3.5–5.1)
Sodium: 142 mmol/L (ref 135–145)

## 2021-02-12 LAB — URINE CULTURE: Culture: NO GROWTH

## 2021-02-12 LAB — GLUCOSE, CAPILLARY
Glucose-Capillary: 131 mg/dL — ABNORMAL HIGH (ref 70–99)
Glucose-Capillary: 271 mg/dL — ABNORMAL HIGH (ref 70–99)

## 2021-02-12 MED ORDER — POTASSIUM CHLORIDE CRYS ER 20 MEQ PO TBCR
40.0000 meq | EXTENDED_RELEASE_TABLET | Freq: Once | ORAL | Status: AC
  Administered 2021-02-12: 40 meq via ORAL
  Filled 2021-02-12: qty 2

## 2021-02-12 NOTE — Discharge Summary (Signed)
Physician Discharge Summary  Catherine [name] PTW:[id number] DOB: 04-Oct-2000 DOA: 02/10/2021  PCP: [facility]  Admit date: 02/10/2021 Discharge date: 02/12/2021  Admitted From: Home Disposition: Home  Recommendations for Outpatient Follow-up:  Follow up with PCP in 1-2 weeks Follow-up with endocrinologist in 1 week Please obtain BMP/CBC in one week Please follow up on the following pending results: None  Home Health: No Equipment/Devices: None Discharge Condition: Stable CODE STATUS: Full Diet recommendation: Heart Healthy / Carb Modified   Brief/Interim Summary: Catherine [name] is a 20 y.o. female with medical history significant for type 1 diabetes, focal and segmental glomerular nephrotic syndrome, and hypertension who presents with concerns of nausea and vomiting. Admitted for DKA and started on Endo tool, later transitioned to basal and short-acting once gap closed x2.  We continued IV fluids for another day as bicarb remained at 20 for some time.  Improved to 23 next morning.  Per patient she follow-up closely with her endocrinologist and did not miss her insulin.  We advised patient to follow-up very closely with endocrinologist and see if she can get an insulin pump with a continuous glucose monitor for better control of her type 1 diabetes and to prevent future complications including diabetic ketoacidosis.  Patient also has an history of nephrotic syndrome and followed up with nephrology.  UA remained with significant proteinuria and she will continue her home dose of tacrolimus.  She will continue with rest of her home medications and follow-up with her providers.  Discharge Diagnoses:  Principal Problem:   DKA (diabetic ketoacidosis) (HCC) Active Problems:   Nephrotic syndrome   HTN (hypertension)   Discharge Instructions  Discharge Instructions     Diet - low sodium heart healthy   Complete by: As directed    Discharge instructions    Complete by: As directed    It was pleasure taking care of you. It is important that you have a close follow-up with your endocrinologist and discussed about getting an insulin pump along with a continuous glucose monitor so you can avoid future episodes of diabetic ketoacidosis. Keep yourself well-hydrated. Continue taking your insulin.   Increase activity slowly   Complete by: As directed       Allergies as of 02/12/2021   No Known Allergies      Medication List     STOP taking these medications    ondansetron 4 MG disintegrating tablet Commonly known as: Zofran ODT   predniSONE 20 MG tablet Commonly known as: DELTASONE       TAKE these medications    insulin aspart 100 UNIT/ML injection Commonly known as: novoLOG Inject 5-8 Units into the skin 3 (three) times daily before meals.   Lantus 100 UNIT/ML injection Generic drug: insulin glargine Inject 30 Units into the skin at bedtime.   tacrolimus 1 MG capsule Commonly known as: PROGRAF Take 2 mg by mouth 2 (two) times daily.        Follow-up Information     [facility]. Schedule an appointment as soon as possible for a visit in 1 week(s).   Contact information: [address] [place] [address] [phone]                No Known Allergies  Consultations: None  Procedures/Studies: US RENAL  Result Date: 02/11/2021 CLINICAL DATA:  20 year old female with acute flank pain. EXAM: RENAL / URINARY TRACT ULTRASOUND COMPLETE COMPARISON:  CT Abdomen and Pelvis 10/07/2019. Abdomen ultrasound 07/03/2017. FINDINGS: Right Kidney:  Renal measurements: 10.2 x 4.8 x 5.2 cm = volume: 133 mL. Normal cortical echogenicity. Preserved corticomedullary differentiation, stable since the 2019 ultrasound (series 1, image 81 of that exam versus series 1, image 4 today). Mild prominence of the renal pelvis (image 8) appears stable from the CT last year. No convincing hydronephrosis. Left Kidney:  Renal measurements: 9.7 x 6.5 x 5.8 cm = volume: 193 mL. Normal cortical echogenicity and corticomedullary differentiation. No hydronephrosis. Left kidney also appears stable. Bladder: Appears normal for degree of bladder distention. Both ureteral jets detected with Doppler. Other: None. IMPRESSION: Negative ultrasound appearance of both kidneys and the bladder. Electronically Signed   By: Odessa Fleming M.D.   On: 02/11/2021 04:41    Subjective: Patient was seen and examined today.  No new complaints.  Denies any nausea, vomiting or abdominal pain.  Wants to go home.  We discussed about seeing her endocrinologist within next week and discussing with her regarding getting an insulin pump and a continuous glucose monitor to prevent future admissions with DKA.  Discharge Exam: Vitals:   02/12/21 0425 02/12/21 0805  BP: (!) 137/95 (!) 129/94  Pulse: 73 95  Resp: 18 18  Temp: 98 F (36.7 C) 98.2 F (36.8 C)  SpO2: 100% 100%   Vitals:   02/11/21 1617 02/11/21 2045 02/12/21 0425 02/12/21 0805  BP: 123/86 122/90 (!) 137/95 (!) 129/94  Pulse: 95 93 73 95  Resp: 19 20 18 18   Temp: 98.8 F (37.1 C) 97.8 F (36.6 C) 98 F (36.7 C) 98.2 F (36.8 C)  TempSrc: Oral Oral Oral Oral  SpO2: 99% 100% 100% 100%  Weight: 50.4 kg     Height: 4\' 11"  (1.499 m)       General: Pt is alert, awake, not in acute distress Cardiovascular: RRR, S1/S2 +, no rubs, no gallops Respiratory: CTA bilaterally, no wheezing, no rhonchi Abdominal: Soft, NT, ND, bowel sounds + Extremities: no edema, no cyanosis   The results of significant diagnostics from this hospitalization (including imaging, microbiology, ancillary and laboratory) are listed below for reference.    Microbiology: Recent Results (from the past 240 hour(s))  Resp Panel by RT-PCR (Flu A&B, Covid) Nasopharyngeal Swab     Status: None   Collection Time: 02/10/21  5:32 PM   Specimen: Nasopharyngeal Swab; Nasopharyngeal(NP) swabs in vial transport medium   Result Value Ref Range Status   SARS Coronavirus 2 by RT PCR NEGATIVE NEGATIVE Final    Comment: (NOTE) SARS-CoV-2 target nucleic acids are NOT DETECTED.  The SARS-CoV-2 RNA is generally detectable in upper respiratory specimens during the acute phase of infection. The lowest concentration of SARS-CoV-2 viral copies this assay can detect is 138 copies/mL. A negative result does not preclude SARS-Cov-2 infection and should not be used as the sole basis for treatment or other patient management decisions. A negative result may occur with  improper specimen collection/handling, submission of specimen other than nasopharyngeal swab, presence of viral mutation(s) within the areas targeted by this assay, and inadequate number of viral copies(<138 copies/mL). A negative result must be combined with clinical observations, patient history, and epidemiological information. The expected result is Negative.  Fact Sheet for Patients:   Fact Sheet for Healthcare Providers:  04/12/21  This test is no t yet approved or cleared by the [employer] FDA and  has been authorized for detection and/or diagnosis of SARS-CoV-2 by FDA under an Emergency Use Authorization (EUA). This EUA will remain  in effect (meaning this test  can be used) for the duration of the COVID-19 declaration under Section 564(b)(1) of the Act, 21 U.S.C.section 360bbb-3(b)(1), unless the authorization is terminated  or revoked sooner.       Influenza A by PCR NEGATIVE NEGATIVE Final   Influenza B by PCR NEGATIVE NEGATIVE Final    Comment: (NOTE) The Xpert Xpress SARS-CoV-2/FLU/RSV plus assay is intended as an aid in the diagnosis of influenza from Nasopharyngeal swab specimens and should not be used as a sole basis for treatment. Nasal washings and aspirates are unacceptable for Xpert Xpress SARS-CoV-2/FLU/RSV testing.  Fact Sheet for  Patients: BloggerCourse.com  Fact Sheet for Healthcare Providers: SeriousBroker.it  This test is not yet approved or cleared by the [place] FDA and has been authorized for detection and/or diagnosis of SARS-CoV-2 by FDA under an Emergency Use Authorization (EUA). This EUA will remain in effect (meaning this test can be used) for the duration of the COVID-19 declaration under Section 564(b)(1) of the Act, 21 U.S.C. section 360bbb-3(b)(1), unless the authorization is terminated or revoked.  Performed at [facility], [address], [place], [place] [address]   Urine Culture     Status: None   Collection Time: 02/11/21 12:58 AM   Specimen: Urine, Clean Catch  Result Value Ref Range Status   Specimen Description   Final    URINE, CLEAN CATCH Performed at [facility], [address], [place], [place] [address]    Special Requests   Final    NONE Performed at [facility], [address], [place], [place] [address]    Culture   Final    NO GROWTH Performed at [facility], [address], [place], [place] [address]    Report Status 02/12/2021 FINAL  Final     Labs: BNP (last 3 results) No results for input(s): BNP in the last 8760 hours. Basic Metabolic Panel: Recent Labs  Lab 02/11/21 0856 02/11/21 1246 02/11/21 1647 02/11/21 2052 02/12/21 0551  NA 136 136 136 137 142  K 3.4* 3.8 4.1 4.1 3.1*  CL 106 107 106 105 111  CO2 20* 20* 19* 23 22  GLUCOSE 285* 166* 358* 288* 119*  BUN 12 11 17  21* 18  CREATININE 0.79 1.05* 0.96 0.93 0.70  CALCIUM 9.0 8.8* 8.8* 8.9 8.7*   Liver Function Tests: No results for input(s): AST, ALT, ALKPHOS, BILITOT, PROT, ALBUMIN in the last 168 hours. No results for input(s): LIPASE, AMYLASE in the last 168 hours. No results for input(s): AMMONIA in the last 168 hours. CBC: Recent Labs  Lab 02/10/21 1302  WBC 12.6*  HGB 16.4*  HCT 48.6*  MCV  90.0  PLT 488*   Cardiac Enzymes: No results for input(s): CKTOTAL, CKMB, CKMBINDEX, TROPONINI in the last 168 hours. BNP: Invalid input(s): POCBNP CBG: Recent Labs  Lab 02/11/21 1035 02/11/21 1221 02/11/21 1629 02/11/21 2106 02/12/21 0838  GLUCAP 277* 227* 366* 291* 131*   D-Dimer No results for input(s): DDIMER in the last 72 hours. Hgb A1c Recent Labs    02/11/21 0856  HGBA1C 11.9*   Lipid Profile No results for input(s): CHOL, HDL, LDLCALC, TRIG, CHOLHDL, LDLDIRECT in the last 72 hours. Thyroid function studies No results for input(s): TSH, T4TOTAL, T3FREE, THYROIDAB in the last 72 hours.  Invalid input(s): FREET3 Anemia work up No results for input(s): VITAMINB12, FOLATE, FERRITIN, TIBC, IRON, RETICCTPCT in the last 72 hours. Urinalysis    Component Value Date/Time   COLORURINE YELLOW (A) 02/11/2021 04/13/2021  APPEARANCEUR CLOUDY (A) 02/11/2021 0058   LABSPEC 1.020 02/11/2021 0058   PHURINE 5.0 02/11/2021 0058   GLUCOSEU >=500 (A) 02/11/2021 0058   HGBUR SMALL (A) 02/11/2021 0058   BILIRUBINUR NEGATIVE 02/11/2021 0058   KETONESUR 80 (A) 02/11/2021 0058   PROTEINUR >=300 (A) 02/11/2021 0058   NITRITE NEGATIVE 02/11/2021 0058   LEUKOCYTESUR NEGATIVE 02/11/2021 0058   Sepsis Labs Invalid input(s): PROCALCITONIN,  WBC,  LACTICIDVEN Microbiology Recent Results (from the past 240 hour(s))  Resp Panel by RT-PCR (Flu A&B, Covid) Nasopharyngeal Swab     Status: None   Collection Time: 02/10/21  5:32 PM   Specimen: Nasopharyngeal Swab; Nasopharyngeal(NP) swabs in vial transport medium  Result Value Ref Range Status   SARS Coronavirus 2 by RT PCR NEGATIVE NEGATIVE Final    Comment: (NOTE) SARS-CoV-2 target nucleic acids are NOT DETECTED.  The SARS-CoV-2 RNA is generally detectable in upper respiratory specimens during the acute phase of infection. The lowest concentration of SARS-CoV-2 viral copies this assay can detect is 138 copies/mL. A negative result does not  preclude SARS-Cov-2 infection and should not be used as the sole basis for treatment or other patient management decisions. A negative result may occur with  improper specimen collection/handling, submission of specimen other than nasopharyngeal swab, presence of viral mutation(s) within the areas targeted by this assay, and inadequate number of viral copies(<138 copies/mL). A negative result must be combined with clinical observations, patient history, and epidemiological information. The expected result is Negative.  Fact Sheet for Patients:  BloggerCourse.comhttps://www.fda.gov/media/152166/download  Fact Sheet for Healthcare Providers:  SeriousBroker.ithttps://www.fda.gov/media/152162/download  This test is no t yet approved or cleared by the [place] FDA and  has been authorized for detection and/or diagnosis of SARS-CoV-2 by FDA under an Emergency Use Authorization (EUA). This EUA will remain  in effect (meaning this test can be used) for the duration of the COVID-19 declaration under Section 564(b)(1) of the Act, 21 U.S.C.section 360bbb-3(b)(1), unless the authorization is terminated  or revoked sooner.       Influenza A by PCR NEGATIVE NEGATIVE Final   Influenza B by PCR NEGATIVE NEGATIVE Final    Comment: (NOTE) The Xpert Xpress SARS-CoV-2/FLU/RSV plus assay is intended as an aid in the diagnosis of influenza from Nasopharyngeal swab specimens and should not be used as a sole basis for treatment. Nasal washings and aspirates are unacceptable for Xpert Xpress SARS-CoV-2/FLU/RSV testing.  Fact Sheet for Patients: BloggerCourse.comhttps://www.fda.gov/media/152166/download  Fact Sheet for Healthcare Providers: SeriousBroker.ithttps://www.fda.gov/media/152162/download  This test is not yet approved or cleared by the [place] FDA and has been authorized for detection and/or diagnosis of SARS-CoV-2 by FDA under an Emergency Use Authorization (EUA). This EUA will remain in effect (meaning this test can be used) for the  duration of the COVID-19 declaration under Section 564(b)(1) of the Act, 21 U.S.C. section 360bbb-3(b)(1), unless the authorization is terminated or revoked.  Performed at [facility], [address] [place], [place] [address]   Urine Culture     Status: None   Collection Time: 02/11/21 12:58 AM   Specimen: Urine, Clean Catch  Result Value Ref Range Status   Specimen Description   Final    URINE, CLEAN CATCH Performed at [facility], [address] [place], [place] [address]    Special Requests   Final    NONE Performed at [facility], [address] [place], [place] [address]    Culture   Final    NO GROWTH Performed at [facility], [address], [place], [place] [address]    Report Status 02/12/2021 FINAL  Final    Time coordinating discharge: Over 30 minutes  SIGNED:  Arnetha Courser, MD  [facility] 02/12/2021, 10:33 AM  If 7PM-7AM, please contact night-coverage [email or web address]  This record has been created using Conservation officer, historic buildings. Errors have been sought and corrected,but may not always be located. Such creation errors do not reflect on the standard of care.

## 2021-02-14 LAB — GLUCOSE, CAPILLARY: Glucose-Capillary: 597 mg/dL (ref 70–99)

## 2021-05-17 ENCOUNTER — Emergency Department: Payer: Medicaid Other

## 2021-05-17 ENCOUNTER — Inpatient Hospital Stay
Admission: EM | Admit: 2021-05-17 | Discharge: 2021-05-19 | DRG: 871 | Disposition: A | Discharge status: Home / Self Care | Payer: Medicaid Other | Attending: Internal Medicine | Admitting: Internal Medicine

## 2021-05-17 ENCOUNTER — Other Ambulatory Visit: Payer: Self-pay

## 2021-05-17 DIAGNOSIS — J9601 Acute respiratory failure with hypoxia: Secondary | ICD-10-CM | POA: Diagnosis present

## 2021-05-17 DIAGNOSIS — E876 Hypokalemia: Secondary | ICD-10-CM | POA: Diagnosis present

## 2021-05-17 DIAGNOSIS — A419 Sepsis, unspecified organism: Secondary | ICD-10-CM | POA: Diagnosis present

## 2021-05-17 DIAGNOSIS — E1110 Type 2 diabetes mellitus with ketoacidosis without coma: Secondary | ICD-10-CM | POA: Diagnosis present

## 2021-05-17 DIAGNOSIS — A4189 Other specified sepsis: Principal | ICD-10-CM | POA: Diagnosis present

## 2021-05-17 DIAGNOSIS — Z794 Long term (current) use of insulin: Secondary | ICD-10-CM | POA: Diagnosis not present

## 2021-05-17 DIAGNOSIS — E860 Dehydration: Secondary | ICD-10-CM | POA: Diagnosis present

## 2021-05-17 DIAGNOSIS — N041 Nephrotic syndrome with focal and segmental glomerular lesions: Secondary | ICD-10-CM | POA: Diagnosis present

## 2021-05-17 DIAGNOSIS — R0602 Shortness of breath: Secondary | ICD-10-CM | POA: Diagnosis present

## 2021-05-17 DIAGNOSIS — N179 Acute kidney failure, unspecified: Secondary | ICD-10-CM | POA: Diagnosis present

## 2021-05-17 DIAGNOSIS — R6520 Severe sepsis without septic shock: Secondary | ICD-10-CM | POA: Diagnosis present

## 2021-05-17 DIAGNOSIS — R5383 Other fatigue: Secondary | ICD-10-CM | POA: Diagnosis not present

## 2021-05-17 DIAGNOSIS — J101 Influenza due to other identified influenza virus with other respiratory manifestations: Secondary | ICD-10-CM | POA: Diagnosis present

## 2021-05-17 DIAGNOSIS — I10 Essential (primary) hypertension: Secondary | ICD-10-CM | POA: Diagnosis present

## 2021-05-17 DIAGNOSIS — E1010 Type 1 diabetes mellitus with ketoacidosis without coma: Secondary | ICD-10-CM | POA: Diagnosis present

## 2021-05-17 DIAGNOSIS — Z7960 Long term (current) use of unspecified immunomodulators and immunosuppressants: Secondary | ICD-10-CM | POA: Diagnosis not present

## 2021-05-17 DIAGNOSIS — R5381 Other malaise: Secondary | ICD-10-CM | POA: Diagnosis not present

## 2021-05-17 DIAGNOSIS — Z20822 Contact with and (suspected) exposure to covid-19: Secondary | ICD-10-CM | POA: Diagnosis present

## 2021-05-17 DIAGNOSIS — E1065 Type 1 diabetes mellitus with hyperglycemia: Secondary | ICD-10-CM | POA: Diagnosis present

## 2021-05-17 LAB — BASIC METABOLIC PANEL
Anion gap: 23 — ABNORMAL HIGH (ref 5–15)
Anion gap: 19 — ABNORMAL HIGH (ref 5–15)
BUN: 17 mg/dL (ref 6–20)
BUN: 14 mg/dL (ref 6–20)
CO2: 11 mmol/L — ABNORMAL LOW (ref 22–32)
CO2: 9 mmol/L — ABNORMAL LOW (ref 22–32)
Calcium: 9.8 mg/dL (ref 8.9–10.3)
Calcium: 8.8 mg/dL — ABNORMAL LOW (ref 8.9–10.3)
Chloride: 101 mmol/L (ref 98–111)
Chloride: 109 mmol/L (ref 98–111)
Creatinine, Ser: 1.31 mg/dL — ABNORMAL HIGH (ref 0.44–1.00)
Creatinine, Ser: 1 mg/dL (ref 0.44–1.00)
GFR, Estimated: 59 mL/min — ABNORMAL LOW (ref >60)
GFR, Estimated: >60 mL/min (ref >60)
Glucose, Bld: 450 mg/dL — ABNORMAL HIGH (ref 70–99)
Glucose, Bld: 244 mg/dL — ABNORMAL HIGH (ref 70–99)
Potassium: 4.4 mmol/L (ref 3.5–5.1)
Potassium: 4.7 mmol/L (ref 3.5–5.1)
Sodium: 135 mmol/L (ref 135–145)
Sodium: 137 mmol/L (ref 135–145)

## 2021-05-17 LAB — TROPONIN I (HIGH SENSITIVITY)
Troponin I (High Sensitivity): 11 ng/L (ref <18)
Troponin I (High Sensitivity): 8 ng/L (ref <18)

## 2021-05-17 LAB — CBC
HCT: 45.8 % (ref 36.0–46.0)
Hemoglobin: 15.4 g/dL — ABNORMAL HIGH (ref 12.0–15.0)
MCH: 28.8 pg (ref 26.0–34.0)
MCHC: 33.6 g/dL (ref 30.0–36.0)
MCV: 85.6 fL (ref 80.0–100.0)
Platelets: 338 10*3/uL (ref 150–400)
RBC: 5.35 MIL/uL — ABNORMAL HIGH (ref 3.87–5.11)
RDW: 13 % (ref 11.5–15.5)
WBC: 13.1 10*3/uL — ABNORMAL HIGH (ref 4.0–10.5)
nRBC: 0 % (ref 0.0–0.2)

## 2021-05-17 LAB — RESP PANEL BY RT-PCR (FLU A&B, COVID) ARPGX2
Influenza A by PCR: POSITIVE — AB
Influenza B by PCR: NEGATIVE
SARS Coronavirus 2 by RT PCR: NEGATIVE

## 2021-05-17 LAB — HEPATIC FUNCTION PANEL
ALT: 84 U/L — ABNORMAL HIGH (ref 0–44)
AST: 33 U/L (ref 15–41)
Albumin: 3.5 g/dL (ref 3.5–5.0)
Alkaline Phosphatase: 117 U/L (ref 38–126)
Bilirubin, Direct: 0.1 mg/dL (ref 0.0–0.2)
Indirect Bilirubin: 1.5 mg/dL — ABNORMAL HIGH (ref 0.3–0.9)
Total Bilirubin: 1.6 mg/dL — ABNORMAL HIGH (ref 0.3–1.2)
Total Protein: 8.2 g/dL — ABNORMAL HIGH (ref 6.5–8.1)

## 2021-05-17 LAB — BLOOD GAS, VENOUS
Acid-base deficit: 16.4 mmol/L — ABNORMAL HIGH (ref 0.0–2.0)
Bicarbonate: 11.6 mmol/L — ABNORMAL LOW (ref 20.0–28.0)
O2 Saturation: 34.8 %
Patient temperature: 37
pCO2, Ven: 34 mmHg — ABNORMAL LOW (ref 44.0–60.0)
pH, Ven: 7.14 — CL (ref 7.250–7.430)
pO2, Ven: <31 mmHg — CL (ref 32.0–45.0)

## 2021-05-17 LAB — BETA-HYDROXYBUTYRIC ACID: Beta-Hydroxybutyric Acid: 5.65 mmol/L — ABNORMAL HIGH (ref 0.05–0.27)

## 2021-05-17 LAB — CBG MONITORING, ED
Glucose-Capillary: 120 mg/dL — ABNORMAL HIGH (ref 70–99)
Glucose-Capillary: 126 mg/dL — ABNORMAL HIGH (ref 70–99)
Glucose-Capillary: 194 mg/dL — ABNORMAL HIGH (ref 70–99)
Glucose-Capillary: 403 mg/dL — ABNORMAL HIGH (ref 70–99)

## 2021-05-17 LAB — GROUP A STREP BY PCR: Group A Strep by PCR: NOT DETECTED

## 2021-05-17 LAB — LACTIC ACID, PLASMA: Lactic Acid, Venous: 2.1 mmol/L (ref 0.5–1.9)

## 2021-05-17 LAB — LIPASE, BLOOD: Lipase: 29 U/L (ref 11–51)

## 2021-05-17 MED ORDER — POTASSIUM CHLORIDE 10 MEQ/100ML IV SOLN
10.0000 meq | INTRAVENOUS | Status: AC
  Administered 2021-05-17 – 2021-05-18 (×2): 10 meq via INTRAVENOUS
  Filled 2021-05-17 (×2): qty 100

## 2021-05-17 MED ORDER — LACTATED RINGERS IV BOLUS
20.0000 mL/kg | Freq: Once | INTRAVENOUS | Status: AC
  Administered 2021-05-18: 1088 mL via INTRAVENOUS

## 2021-05-17 MED ORDER — OSELTAMIVIR PHOSPHATE 30 MG PO CAPS
30.0000 mg | ORAL_CAPSULE | Freq: Two times a day (BID) | ORAL | Status: DC
  Filled 2021-05-17 (×2): qty 1

## 2021-05-17 MED ORDER — INSULIN REGULAR(HUMAN) IN NACL 100-0.9 UT/100ML-% IV SOLN
INTRAVENOUS | Status: DC
  Administered 2021-05-17: 2.6 [IU]/h via INTRAVENOUS
  Filled 2021-05-17: qty 100

## 2021-05-17 MED ORDER — LACTATED RINGERS IV SOLN: INTRAVENOUS | Status: DC

## 2021-05-17 MED ORDER — DEXTROSE 50 % IV SOLN: 0.0000 mL | INTRAVENOUS | Status: DC | PRN

## 2021-05-17 MED ORDER — SODIUM CHLORIDE 0.9 % IV BOLUS
1000.0000 mL | Freq: Once | INTRAVENOUS | Status: AC
  Administered 2021-05-17: 1000 mL via INTRAVENOUS

## 2021-05-17 MED ORDER — MORPHINE SULFATE (PF) 4 MG/ML IV SOLN
4.0000 mg | Freq: Once | INTRAVENOUS | Status: AC
  Administered 2021-05-17: 4 mg via INTRAVENOUS
  Filled 2021-05-17: qty 1

## 2021-05-17 MED ORDER — HEPARIN SODIUM (PORCINE) 5000 UNIT/ML IJ SOLN
5000.0000 [IU] | Freq: Three times a day (TID) | INTRAMUSCULAR | Status: DC
  Administered 2021-05-18 (×3): 5000 [IU] via SUBCUTANEOUS
  Filled 2021-05-17 (×2): qty 1

## 2021-05-17 MED ORDER — LACTATED RINGERS IV BOLUS: 20.0000 mL/kg | Freq: Once | INTRAVENOUS | Status: DC

## 2021-05-17 MED ORDER — POTASSIUM CHLORIDE 10 MEQ/100ML IV SOLN: 10.0000 meq | INTRAVENOUS | Status: DC

## 2021-05-17 MED ORDER — ONDANSETRON HCL 4 MG/2ML IJ SOLN
4.0000 mg | Freq: Once | INTRAMUSCULAR | Status: AC
  Administered 2021-05-17: 4 mg via INTRAVENOUS
  Filled 2021-05-17: qty 2

## 2021-05-17 MED ORDER — DEXTROSE IN LACTATED RINGERS 5 % IV SOLN: INTRAVENOUS | Status: DC

## 2021-05-17 MED ORDER — SODIUM CHLORIDE 0.9 % IV SOLN
3.0000 g | Freq: Four times a day (QID) | INTRAVENOUS | Status: DC
  Administered 2021-05-17 – 2021-05-19 (×4): 3 g via INTRAVENOUS
  Filled 2021-05-17 (×4): qty 8

## 2021-05-17 NOTE — ED Provider Notes (Signed)
Emergency Medicine Provider Triage Evaluation Note  Catherine [name] , a 20 y.o. female  was evaluated in triage.  Pt complains shortness of breath, chest pain, chest tightness, cough and hyperglycemia.  Patient states that she has had cold-like symptoms for the past 2 to 3 days and has been unable to control her blood glucose levels at home.  Last admitted for DKA in August.  Review of Systems  Positive: Patient has chest pain, chest tightness and cough.  Negative: Patient has nausea.   Physical Exam  BP 97/66   Pulse (!) 115   Temp 97.8 F (36.6 C) (Oral)   Resp 16   Ht 4\' 11"  (1.499 m)   Wt 54.4 kg   SpO2 100%   BMI 24.24 kg/m  Gen:   Awake, no distress   Resp:  Normal effort  MSK:   Moves extremities without difficulty  Other:    Medical Decision Making  Medically screening exam initiated at 6:11 PM.  Appropriate orders placed.  Keshona G Schlottman was informed that the remainder of the evaluation will be completed by another provider, this initial triage assessment does not replace that evaluation, and the importance of remaining in the ED until their evaluation is complete.     [place], PA-C 05/17/21 1813    05/19/21, MD 05/17/21 2124

## 2021-05-17 NOTE — ED Triage Notes (Signed)
Pt to ED for shob, chest pain and hyperglycemia. States cbg 300s PTA. Sx started today NAD noted  Cbg 403

## 2021-05-17 NOTE — H&P (Signed)
History and Physical    [name] [id number] DOB: 04/18/2001 DOA: 05/17/2021  PCP: [facility]    Patient coming from:  Home   Chief Complaint:  Shortness of breath chest pain and hyperglycemia.   HPI:  Catherine [name] is a 20 y.o. female seen in ed with complaints of shortness of breath chest pain and diabetes diabetes type 1 presenting with cough congestion sore throat chest discomfort over symptoms of been going on for about 2 days or so.  Feels like the last time she had DKA per patient. Resistances and was negative for headaches blurred vision speech or gait issues palpitations any abdominal issues any skin or joint issues. Per pt her symptoms started thursday - headache/ congestion/ cough/fever and chils. Then she was taking robitussin and tylenol and she was still sick.  Chest pain is middle of chest since: Sunday  Pt has past medical history of cough, shortness of breath, chest pain, 10/10, pressure, worse with coughing and breathing, constant. Pt also has back pain and she has been trying ot get fresh air. No other alleviating factors.   ED Course: Patient is a young 20 year old female with past medical history of DKA type 1 diabetes presenting with shortness of breath cough and chest pain. Vitals:   05/17/21 1805 05/17/21 2057 05/17/21 2200 05/17/21 2300  BP:  (!) 103/55 104/75 116/84  Pulse:  (!) 111  (!) 106  Resp:  16 (!) 26 (!) 21  Temp:      TempSrc:      SpO2:  99%  100%  Weight: 54.4 kg     Height: 4\' 11"  (1.499 m)     In the emergency room ABG shows a pH of 7.014 This is a venous blood gas.  Lactic acid of 2.1, beta hydroxy of 5.65+ a positive, COVID-negative.  Patient has SIRS criteria source of infection sepsis protocol CBC shows a normal white count hemoglobin of 15.4 and platelets of 338. In the emergency room patient given Unasyn, insulin, LR, Tamiflu, normal saline.  Review of Systems:  Review of Systems   Constitutional:  Positive for chills, fever and malaise/fatigue.  HENT:  Positive for sinus pain.   Respiratory:  Positive for cough and shortness of breath.   Gastrointestinal:  Positive for nausea and vomiting.  All other systems reviewed and are negative.   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 No pertinent surgical history.   reports that she has never smoked. She has never used smokeless tobacco. She reports that she does not currently use alcohol. She reports that she does not currently use drugs.  No Known Allergies  History reviewed. No pertinent family history.  Prior to Admission medications   Medication Sig Start Date End Date Taking? Authorizing Provider  insulin aspart (NOVOLOG) 100 UNIT/ML injection Inject 5-8 Units into the skin 3 (three) times daily before meals.    [provider]  LANTUS 100 UNIT/ML injection Inject 30 Units into the skin at bedtime.  01/27/20   [provider]  tacrolimus (PROGRAF) 1 MG capsule Take 2 mg by mouth 2 (two) times daily.    [provider]    Physical Exam: Vitals:   05/17/21 1805 05/17/21 2057 05/17/21 2200 05/17/21 2300  BP:  (!) 103/55 104/75 116/84  Pulse:  (!) 111  Marland Kitchen)  106  Resp:  16 (!) 26 (!) 21  Temp:      TempSrc:      SpO2:  99%  100%  Weight: 54.4 kg     Height: 4\' 11"  (1.499 m)      Physical Exam Vitals and nursing note reviewed.  Constitutional:      General: She is not in acute distress.    Appearance: She is ill-appearing. She is not toxic-appearing or diaphoretic.  HENT:     Head: Normocephalic and atraumatic.     Right Ear: External ear normal.     Left Ear: External ear normal.     Nose: Nose normal.     Mouth/Throat:     Mouth: Mucous membranes are dry.  Eyes:     Extraocular Movements: Extraocular movements  intact.     Pupils: Pupils are equal, round, and reactive to light.  Cardiovascular:     Rate and Rhythm: Normal rate and regular rhythm.     Pulses: Normal pulses.     Heart sounds: Normal heart sounds.  Pulmonary:     Effort: Pulmonary effort is normal.     Breath sounds: Normal breath sounds.  Abdominal:     General: Bowel sounds are normal. There is no distension.     Palpations: Abdomen is soft. There is no mass.     Tenderness: There is no abdominal tenderness. There is no guarding.     Hernia: No hernia is present.  Musculoskeletal:       Arms:     Right lower leg: No edema.     Left lower leg: No edema.     Comments: Location of reproducible chest pain worse with coughing.    Skin:    General: Skin is warm.  Neurological:     General: No focal deficit present.     Mental Status: She is alert and oriented to person, place, and time.  Psychiatric:        Mood and Affect: Mood normal.        Behavior: Behavior normal.   Labs on Admission: I have personally reviewed following labs and imaging studies  No results for input(s): CKTOTAL, CKMB, TROPONINI in the last 72 hours. Lab Results  Component Value Date   WBC 13.1 (H) 05/17/2021   HGB 15.4 (H) 05/17/2021   HCT 45.8 05/17/2021   MCV 85.6 05/17/2021   PLT 338 05/17/2021    Recent Labs  Lab 05/17/21 2033  NA 137  K 4.7  CL 109  CO2 9*  BUN 14  CREATININE 1.00  CALCIUM 8.8*  PROT 8.2*  BILITOT 1.6*  ALKPHOS 117  ALT 84*  AST 33  GLUCOSE 244*   Lab Results  Component Value Date   CHOL 174 04/19/2020   HDL 32 (L) 04/19/2020   LDLCALC 116 (H) 04/19/2020   TRIG 131 04/19/2020   COVID-19 Labs No results for input(s): DDIMER, FERRITIN, LDH, CRP in the last 72 hours. Lab Results  Component Value Date   SARSCOV2NAA NEGATIVE 05/17/2021   SARSCOV2NAA NEGATIVE 02/10/2021   SARSCOV2NAA NEGATIVE 04/18/2020   SARSCOV2NAA POSITIVE (A) 08/21/2019    Radiological Exams on Admission: DG Chest 2  View  Result Date: 05/17/2021 CLINICAL DATA:  Chest pain and shortness of breath EXAM: CHEST - 2 VIEW COMPARISON:  10/07/2019 FINDINGS: Cardiac shadow is within normal limits. Lungs are well aerated bilaterally. Some mild patchy interstitial opacities are identified bilaterally stable in appearance from the prior exam. Healed rib fracture  in the right sixth rib anteriorly is noted. No other bony abnormality is seen. IMPRESSION: Chronic interstitial changes without acute abnormality. Electronically Signed   By: Alcide Clever M.D.   On: 05/17/2021 19:18    EKG: Independently reviewed.  Sinus tach 117, rad .   Assessment/Plan: Patient is a 20 year old American female presenting with generalized malaise fatigue, cough shortness of breath chest pain worse with coughing all since past 2 days. Principal Problem:   Malaise and fatigue Active Problems:   SOB (shortness of breath)   DKA (diabetic ketoacidosis) (HCC)   Type 1 diabetes mellitus with hyperglycemia (HCC)   AKI (acute kidney injury) (HCC)   Severe sepsis (HCC)   HTN (hypertension)   Influenza A Malaise and fatigue: Attribute to current illness with DKA and acidosis and sepsis. Will obtain a CPK.  Along with thyroid panels. Fall precautions.  Ambulation with assistance.  Shortness of breath: Attribute to influenza A. We will continue Tamiflu at full dose patient's kidney function has improved we will change to 70 mg.As needed albuterol and as needed meds for many 2D echocardiogram. Acute kidney injury:   DKA/type 1 diabetes mellitus: We will continue patient on DKA protocol with insulin drip. We will monitor electrolytes particularly potassium and magnesium. Accucheck q1 and bmp q2 hour x 3 occurrences.     Acute kidney injury: Lab Results  Component Value Date   CREATININE 1.00 05/17/2021   CREATININE 1.31 (H) 05/17/2021   CREATININE 0.70 02/12/2021  Initial kidney function is resolved suspect prerenal secondary to  dehydration and hypovolemia secondary to hyperglycemia. We will change Tamiflu dose to 70 mg twice daily instead of 30.  Severe sepsis: Patient meets guidelines for severe sepsis with heart rate respiratory rate source of infection elevated lactic which we will trend. CPK still pending. Supportive care with IV fluid therapy IV Protonix,heparin.    Hypertension: Blood pressure 116/84, pulse (!) 106, temperature 97.8 F (36.6 C), temperature source Oral, resp. rate (!) 21, height 4\' 11"  (1.499 m), weight 54.4 kg, SpO2 100 %. Patient on any medications for hypertension blood pressure is well controlled and not certain she has the diagnosis of high blood pressure.  We will monitor blood pressure overnight.  Influenza A: Droplet precautions, Tamiflu started.   DVT prophylaxis:  Heparin  Code Status:  Full code  Family Communication (Mother)  [phone] (Home Phone)   Disposition Plan:  Home  Consults called:  Note  Admission status: Inpatient   [phone] MD [facility] [phone] How to contact the [facility] Attending or Consulting provider 7A - 7P or covering provider during after hours 7P -7A, for this patient.    Check the care team in [facility] and look for a) attending/consulting [facility] provider listed and b) the [facility] team listed Log into [email or web address] and use [place]'s universal password to access. If you do not have the password, please contact the hospital operator. Locate the [facility] provider you are looking for under [facility] and page to a number that you can be directly reached. If you still have difficulty reaching the provider, please page the [facility] (Director on Call) for the Hospitalists listed on amion for assistance. [email or web address] Password [email or web address] 05/18/2021, 1:54 AM

## 2021-05-17 NOTE — ED Provider Notes (Signed)
[facility] Emergency Department Provider Note  ____________________________________________   Event Date/Time   First MD Initiated Contact with Patient 05/17/21 1938     (approximate)  I have reviewed the triage vital signs and the nursing notes.   HISTORY  Chief Complaint Shortness of Breath, Chest Pain, and Hyperglycemia    HPI Catherine [name] is a 20 y.o. female  with history of T1DM, nephrotic syndrome, here with multiple complaints. Pt reports approx 1 week of cough, nasal congestion, and sore throat. She has been trying to drink, stay hydrated but has had difficulty due to feeling so poor. She has had worsening cough, SOB, and sputum production w/ subjective fevers. She reports over the past 2 days or so, she's had worsening SOB, nausea, and fatigue. Feels similar to her prior DKA episodes. Has been taking her insulin without improvement and with increasing BG at home. No known sick contacts. She did receive her flu vaccine about 3 weeks ago. No urinary sx.        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  Prior to Admission medications   Medication Sig Start Date End Date Taking? Authorizing Provider  insulin aspart (NOVOLOG) 100 UNIT/ML injection Inject 5-8 Units into the skin 3 (three)  times daily before meals.    [provider]  LANTUS 100 UNIT/ML injection Inject 30 Units into the skin at bedtime.  01/27/20   [provider]  tacrolimus (PROGRAF) 1 MG capsule Take 2 mg by mouth 2 (two) times daily.    [provider]    Allergies Patient has no known allergies.  No family history on file.  Social History Social History   Tobacco Use   Smoking status: Never   Smokeless tobacco: Never  Vaping Use   Vaping Use: Never used  Substance Use Topics   Alcohol use: Not Currently   Drug use: Not Currently    Review of Systems  Review of Systems  Constitutional:  Positive for chills and fatigue. Negative for fever.  HENT:  Positive for congestion and rhinorrhea. Negative for sore throat.   Eyes:  Negative for visual disturbance.  Respiratory:  Positive for cough. Negative for shortness of breath.   Cardiovascular:  Negative for chest pain.  Gastrointestinal:  Positive for nausea. Negative for abdominal pain, diarrhea and vomiting.  Genitourinary:  Negative for flank pain.  Musculoskeletal:  Negative for back pain and neck pain.  Skin:  Negative for rash and wound.  Neurological:  Positive for weakness.  All other systems reviewed and are negative.   ____________________________________________  PHYSICAL EXAM:      VITAL SIGNS: ED Triage Vitals  Enc Vitals Group     BP 05/17/21 1801 97/66     Pulse Rate 05/17/21 1801 (!) 115     Resp  05/17/21 1801 16     Temp 05/17/21 1801 97.8 F (36.6 C)     Temp Source 05/17/21 1801 Oral     SpO2 05/17/21 1801 100 %     Weight 05/17/21 1805 120 lb (54.4 kg)     Height 05/17/21 1805 4\' 11"  (1.499 m)     Head Circumference --      Peak Flow --      Pain Score 05/17/21 1804 10     Pain Loc --      Pain Edu? --      Excl. in [place]? --      Physical Exam Vitals and nursing note reviewed.  Constitutional:      General: She is not in acute distress.    Appearance: She is well-developed.   HENT:     Head: Normocephalic and atraumatic.     Mouth/Throat:     Comments: Dry MM. Moderate posterior pharyngeal erythema. Possible slight uvular edema though OP is widely patent. No peritonsillar asymmetry. Neck supple. No sublingual swelling. Eyes:     Conjunctiva/sclera: Conjunctivae normal.  Cardiovascular:     Rate and Rhythm: Regular rhythm. Tachycardia present.     Heart sounds: Normal heart sounds. No murmur heard.   No friction rub.  Pulmonary:     Effort: Pulmonary effort is normal. Tachypnea present. No respiratory distress.     Breath sounds: Normal breath sounds. No wheezing or rales.  Abdominal:     General: There is no distension.     Palpations: Abdomen is soft.     Tenderness: There is no abdominal tenderness.  Musculoskeletal:     Cervical back: Neck supple.  Skin:    General: Skin is warm.     Capillary Refill: Capillary refill takes less than 2 seconds.  Neurological:     Mental Status: She is alert and oriented to person, place, and time.     Motor: No abnormal muscle tone.      ____________________________________________   LABS (all labs ordered are listed, but only abnormal results are displayed)  Labs Reviewed  RESP PANEL BY RT-PCR (FLU A&B, COVID) ARPGX2 - Abnormal; Notable for the following components:      Result Value   Influenza A by PCR POSITIVE (*)    All other components within normal limits  BASIC METABOLIC PANEL - Abnormal; Notable for the following components:   CO2 11 (*)    Glucose, Bld 450 (*)    Creatinine, Ser 1.31 (*)    GFR, Estimated 59 (*)    Anion gap 23 (*)    All other components within normal limits  CBC - Abnormal; Notable for the following components:   WBC 13.1 (*)    RBC 5.35 (*)    Hemoglobin 15.4 (*)    All other components within normal limits  LACTIC ACID, PLASMA - Abnormal; Notable for the following components:   Lactic Acid, Venous 2.1 (*)    All other components within normal limits  BLOOD GAS,  VENOUS - Abnormal; Notable for the following components:   pH, Ven 7.14 (*)    pCO2, Ven 34 (*)    pO2, Ven <31.0 (*)    Bicarbonate 11.6 (*)    Acid-base deficit 16.4 (*)    All other components within normal limits  BETA-HYDROXYBUTYRIC ACID - Abnormal; Notable for the following components:   Beta-Hydroxybutyric Acid 5.65 (*)    All other components within normal limits  CBG MONITORING, ED - Abnormal; Notable for  the following components:   Glucose-Capillary 403 (*)    All other components within normal limits  CBG MONITORING, ED - Abnormal; Notable for the following components:   Glucose-Capillary 194 (*)    All other components within normal limits  GROUP A STREP BY PCR  CULTURE, BLOOD (ROUTINE X 2)  CULTURE, BLOOD (ROUTINE X 2)  LACTIC ACID, PLASMA  BASIC METABOLIC PANEL  BASIC METABOLIC PANEL  BASIC METABOLIC PANEL  BETA-HYDROXYBUTYRIC ACID  URINALYSIS, ROUTINE W REFLEX MICROSCOPIC  HEPATIC FUNCTION PANEL  LIPASE, BLOOD  BASIC METABOLIC PANEL  BETA-HYDROXYBUTYRIC ACID  POC URINE PREG, ED  TROPONIN I (HIGH SENSITIVITY)  TROPONIN I (HIGH SENSITIVITY)    ____________________________________________  EKG: Sinus tachycardia, VR 117. PR 128, QRS 72, QTc 471. RA enlargement. No acute ST elevations or depressions. ________________________________________  RADIOLOGY All imaging, including plain films, CT scans, and ultrasounds, independently reviewed by me, and interpretations confirmed via formal radiology reads.  ED MD interpretation:   CXR: Chronic interstitial changes, no focal findings  Official radiology report(s): DG Chest 2 View  Result Date: 05/17/2021 CLINICAL DATA:  Chest pain and shortness of breath EXAM: CHEST - 2 VIEW COMPARISON:  10/07/2019 FINDINGS: Cardiac shadow is within normal limits. Lungs are well aerated bilaterally. Some mild patchy interstitial opacities are identified bilaterally stable in appearance from the prior exam. Healed rib fracture in  the right sixth rib anteriorly is noted. No other bony abnormality is seen. IMPRESSION: Chronic interstitial changes without acute abnormality. Electronically Signed   By: Inez Catalina M.D.   On: 05/17/2021 19:18    ____________________________________________  PROCEDURES   Procedure(s) performed (including Critical Care):  .Critical Care Performed by: Duffy Bruce, MD Authorized by: Duffy Bruce, MD   Critical care provider statement:    Critical care time (minutes):  30   Critical care time was exclusive of:  Separately billable procedures and treating other patients   Critical care was necessary to treat or prevent imminent or life-threatening deterioration of the following conditions:  Circulatory failure, cardiac failure and metabolic crisis   Critical care was time spent personally by me on the following activities:  Development of treatment plan with patient or surrogate, discussions with consultants, evaluation of patient's response to treatment, examination of patient, ordering and review of laboratory studies, ordering and review of radiographic studies, ordering and performing treatments and interventions, pulse oximetry, re-evaluation of patient's condition and review of old charts   I assumed direction of critical care for this patient from another provider in my specialty: no     Care discussed with: admitting provider    ____________________________________________  INITIAL IMPRESSION / MDM / [place] / ED COURSE  As part of my medical decision making, I reviewed the following data within the [place] notes reviewed and incorporated, Old chart reviewed, Notes from prior ED visits, and [place] Controlled Substance Database       *SHAYANA COLO was evaluated in Emergency Department on 05/17/2021 for the symptoms described in the history of present illness. She was evaluated in the context of the global COVID-19 pandemic, which  necessitated consideration that the patient might be at risk for infection with the SARS-CoV-2 virus that causes COVID-19. Institutional protocols and algorithms that pertain to the evaluation of patients at risk for COVID-19 are in a state of rapid change based on information released by regulatory bodies including the CDC and federal and state organizations. These policies and algorithms were followed during the patient's care  in the ED.  Some ED evaluations and interventions may be delayed as a result of limited staffing during the pandemic.*     Medical Decision Making:  20 yo F here with generalized weakness, nausea, SOB. Presentation is c/w DKA, likely in setting of URI, possible pharyngitis/uvulitis based on exam. Re: her DKA - labs are c/w severe DKA. pH 7.1, CO2 11, Glu >500, AG 23. Pt also has moderate leukocytosis and mild AKI. IVF,insulin gtt started. Re: source - suspect viral URI vs pharyngitis. Pt does have some mild uvular edema, though no asymmetry. No signs of airway compromise. She is vaccinated. Given her leukocytosis, will tx empirically with Unasyn. Strep, COVID, influenza sent. Pt is o/w mentating well, no signs of encephalopathy/coma.  Pt influenza A positive. Continue fluids, insulin gtt, will start tamiflu. Admit to step down on insulin gtt. ____________________________________________  FINAL CLINICAL IMPRESSION(S) / ED DIAGNOSES  Final diagnoses:  Diabetic ketoacidosis without coma associated with type 1 diabetes mellitus ([place])  Influenza A     MEDICATIONS GIVEN DURING THIS VISIT:  Medications  insulin regular, human (MYXREDLIN) 100 units/ 100 mL infusion (2.6 Units/hr Intravenous New Bag/Given 05/17/21 2128)  lactated ringers infusion ( Intravenous New Bag/Given 05/17/21 2143)  dextrose 50 % solution 0-50 mL (has no administration in time range)  potassium chloride 10 mEq in 100 mL IVPB (10 mEq Intravenous New Bag/Given 05/17/21 2137)  Ampicillin-Sulbactam (UNASYN)  3 g in sodium chloride 0.9 % 100 mL IVPB (has no administration in time range)  oseltamivir (TAMIFLU) capsule 30 mg (has no administration in time range)  sodium chloride 0.9 % bolus 1,000 mL (0 mLs Intravenous Stopped 05/17/21 1915)  morphine 4 MG/ML injection 4 mg (4 mg Intravenous Given 05/17/21 2031)  ondansetron (ZOFRAN) injection 4 mg (4 mg Intravenous Given 05/17/21 2031)     ED Discharge Orders     None        Note:  This document was prepared using Dragon voice recognition software and may include unintentional dictation errors.   Duffy Bruce, MD 05/17/21 2235

## 2021-05-18 ENCOUNTER — Encounter: Payer: Self-pay | Admitting: Internal Medicine

## 2021-05-18 DIAGNOSIS — J101 Influenza due to other identified influenza virus with other respiratory manifestations: Secondary | ICD-10-CM | POA: Diagnosis present

## 2021-05-18 DIAGNOSIS — R6520 Severe sepsis without septic shock: Secondary | ICD-10-CM | POA: Diagnosis present

## 2021-05-18 DIAGNOSIS — A419 Sepsis, unspecified organism: Secondary | ICD-10-CM | POA: Diagnosis present

## 2021-05-18 DIAGNOSIS — R0602 Shortness of breath: Secondary | ICD-10-CM | POA: Diagnosis present

## 2021-05-18 DIAGNOSIS — R5381 Other malaise: Secondary | ICD-10-CM | POA: Diagnosis present

## 2021-05-18 LAB — CBC
HCT: 34.6 % — ABNORMAL LOW (ref 36.0–46.0)
Hemoglobin: 12 g/dL (ref 12.0–15.0)
MCH: 28.6 pg (ref 26.0–34.0)
MCHC: 34.7 g/dL (ref 30.0–36.0)
MCV: 82.4 fL (ref 80.0–100.0)
Platelets: 277 10*3/uL (ref 150–400)
RBC: 4.2 MIL/uL (ref 3.87–5.11)
RDW: 12.6 % (ref 11.5–15.5)
WBC: 12.9 10*3/uL — ABNORMAL HIGH (ref 4.0–10.5)
nRBC: 0 % (ref 0.0–0.2)

## 2021-05-18 LAB — HEMOGLOBIN A1C
Hgb A1c MFr Bld: 10.5 % — ABNORMAL HIGH (ref 4.8–5.6)
Mean Plasma Glucose: 254.65 mg/dL

## 2021-05-18 LAB — BASIC METABOLIC PANEL
Anion gap: 9 (ref 5–15)
Anion gap: 8 (ref 5–15)
BUN: 9 mg/dL (ref 6–20)
BUN: 7 mg/dL (ref 6–20)
CO2: 18 mmol/L — ABNORMAL LOW (ref 22–32)
CO2: 18 mmol/L — ABNORMAL LOW (ref 22–32)
Calcium: 8.4 mg/dL — ABNORMAL LOW (ref 8.9–10.3)
Calcium: 8.5 mg/dL — ABNORMAL LOW (ref 8.9–10.3)
Chloride: 107 mmol/L (ref 98–111)
Chloride: 109 mmol/L (ref 98–111)
Creatinine, Ser: 0.8 mg/dL (ref 0.44–1.00)
Creatinine, Ser: 0.6 mg/dL (ref 0.44–1.00)
GFR, Estimated: >60 mL/min (ref >60)
GFR, Estimated: >60 mL/min (ref >60)
Glucose, Bld: 154 mg/dL — ABNORMAL HIGH (ref 70–99)
Glucose, Bld: 200 mg/dL — ABNORMAL HIGH (ref 70–99)
Potassium: 3.8 mmol/L (ref 3.5–5.1)
Potassium: 3.3 mmol/L — ABNORMAL LOW (ref 3.5–5.1)
Sodium: 134 mmol/L — ABNORMAL LOW (ref 135–145)
Sodium: 135 mmol/L (ref 135–145)

## 2021-05-18 LAB — CBG MONITORING, ED
Glucose-Capillary: 148 mg/dL — ABNORMAL HIGH (ref 70–99)
Glucose-Capillary: 154 mg/dL — ABNORMAL HIGH (ref 70–99)
Glucose-Capillary: 160 mg/dL — ABNORMAL HIGH (ref 70–99)
Glucose-Capillary: 161 mg/dL — ABNORMAL HIGH (ref 70–99)
Glucose-Capillary: 169 mg/dL — ABNORMAL HIGH (ref 70–99)
Glucose-Capillary: 175 mg/dL — ABNORMAL HIGH (ref 70–99)
Glucose-Capillary: 189 mg/dL — ABNORMAL HIGH (ref 70–99)
Glucose-Capillary: 197 mg/dL — ABNORMAL HIGH (ref 70–99)
Glucose-Capillary: 213 mg/dL — ABNORMAL HIGH (ref 70–99)
Glucose-Capillary: 230 mg/dL — ABNORMAL HIGH (ref 70–99)
Glucose-Capillary: 283 mg/dL — ABNORMAL HIGH (ref 70–99)
Glucose-Capillary: 289 mg/dL — ABNORMAL HIGH (ref 70–99)
Glucose-Capillary: 291 mg/dL — ABNORMAL HIGH (ref 70–99)

## 2021-05-18 LAB — URINALYSIS, ROUTINE W REFLEX MICROSCOPIC
Bacteria, UA: NONE SEEN
Bilirubin Urine: NEGATIVE
Glucose, UA: >=500 mg/dL — AB
Ketones, ur: 80 mg/dL — AB
Nitrite: NEGATIVE
Protein, ur: 100 mg/dL — AB
Specific Gravity, Urine: 1.014 (ref 1.005–1.030)
pH: 5 (ref 5.0–8.0)

## 2021-05-18 LAB — PREGNANCY, URINE: Preg Test, Ur: NEGATIVE

## 2021-05-18 LAB — LACTIC ACID, PLASMA: Lactic Acid, Venous: 1.2 mmol/L (ref 0.5–1.9)

## 2021-05-18 LAB — CK: Total CK: 37 U/L — ABNORMAL LOW (ref 38–234)

## 2021-05-18 MED ORDER — SODIUM CHLORIDE 0.9 % IV SOLN: INTRAVENOUS | Status: DC

## 2021-05-18 MED ORDER — OSELTAMIVIR PHOSPHATE 75 MG PO CAPS
75.0000 mg | ORAL_CAPSULE | Freq: Two times a day (BID) | ORAL | Status: DC
  Administered 2021-05-18 – 2021-05-19 (×3): 75 mg via ORAL
  Filled 2021-05-18 (×4): qty 1

## 2021-05-18 MED ORDER — HYDROCOD POLST-CPM POLST ER 10-8 MG/5ML PO SUER
5.0000 mL | Freq: Two times a day (BID) | ORAL | Status: DC | PRN
  Administered 2021-05-18 (×2): 5 mL via ORAL
  Filled 2021-05-18 (×2): qty 5

## 2021-05-18 MED ORDER — POTASSIUM CHLORIDE CRYS ER 20 MEQ PO TBCR
40.0000 meq | EXTENDED_RELEASE_TABLET | Freq: Once | ORAL | Status: AC
  Administered 2021-05-18: 40 meq via ORAL
  Filled 2021-05-18: qty 2

## 2021-05-18 MED ORDER — DEXTROSE IN LACTATED RINGERS 5 % IV SOLN: INTRAVENOUS | Status: DC

## 2021-05-18 MED ORDER — OXYCODONE HCL 5 MG PO TABS
5.0000 mg | ORAL_TABLET | Freq: Once | ORAL | Status: AC
  Administered 2021-05-18: 5 mg via ORAL
  Filled 2021-05-18: qty 1

## 2021-05-18 MED ORDER — INSULIN ASPART 100 UNIT/ML IJ SOLN
0.0000 [IU] | Freq: Three times a day (TID) | INTRAMUSCULAR | Status: DC
  Administered 2021-05-18 (×2): 2 [IU] via SUBCUTANEOUS
  Administered 2021-05-19 (×2): 3 [IU] via SUBCUTANEOUS
  Filled 2021-05-18 (×4): qty 1

## 2021-05-18 MED ORDER — INSULIN GLARGINE-YFGN 100 UNIT/ML ~~LOC~~ SOLN
15.0000 [IU] | Freq: Two times a day (BID) | SUBCUTANEOUS | Status: DC
  Administered 2021-05-18 – 2021-05-19 (×3): 15 [IU] via SUBCUTANEOUS
  Filled 2021-05-18 (×4): qty 0.15

## 2021-05-18 MED ORDER — INSULIN ASPART 100 UNIT/ML IJ SOLN
3.0000 [IU] | Freq: Three times a day (TID) | INTRAMUSCULAR | Status: DC
Start: 2021-05-18 — End: 2021-05-19
  Administered 2021-05-18 – 2021-05-19 (×2): 3 [IU] via SUBCUTANEOUS
  Filled 2021-05-18 (×2): qty 1

## 2021-05-18 NOTE — Progress Notes (Addendum)
PROGRESS NOTE    Catherine [name]   DDU:[id number]  DOB: 16-Apr-2001  PCP: [facility] [place]    DOA: 05/17/2021 LOS: 1    Brief Narrative / Hospital Course to Date:   20 y.o. female seen in ed with complaints of shortness of breath chest pain and diabetes diabetes type 1 presenting with cough congestion sore throat chest discomfort over symptoms of been going on for about 2 days.  Patient tested positive for Influenza A and found to be in DKA.  Admitted to [facility] service and started on Tamiflu and insulin drip.  Assessment & Plan   Principal Problem:   Malaise and fatigue Active Problems:   AKI (acute kidney injury) ([place])   DKA (diabetic ketoacidosis) (HCC)   HTN (hypertension)   Type 1 diabetes mellitus with hyperglycemia (HCC)   Severe sepsis (HCC)   SOB (shortness of breath)   Influenza A   Diabetic ketoacidosis /type 1 diabetes -DKA present on admission.  Glucose was 237 with metabolic acidosis, anion gap 23, VBG with pH 7.14. Patient on subcutaneous insulin at home.  Regimen appears to be Lantus 30 units at bedtime and NovoLog.  Follows with endocrinologist, Dr. Gabriel Carina. Gap closed x2 earlier this morning Transition off insulin drip and started on subcutaneous insulin today. Now on Lantus 15 units twice daily, NovoLog 3 units 3 times daily with meals plus sliding scale -- Replace potassium today --Adjust insulin for inpatient goal 140-180 --Diabetes coordinator following  Severe sepsis and Acute Respiratory Failure with Hypoxia due to Influenza A Shortness of breath /malaise and fatigue - Patient met severe sepsis criteria on admission with tachycardia, tachypnea, lactic acidosis consistent with organ dysfunction and positive for influenza A --continue Tamiflu --Droplet precautions --As needed albuterol --DC Unasyn, very low suspicion for bacterial infection at this time --Monitor oxygenation and supplement O2 if sats below 90%   Acute kidney  injury - presented with creatinine 1.31, baseline less than 1.  Likely prerenal azotemia in the setting of dehydration due to DKA. AKI resolved with fluids. --Monitor BMP  ?History of hypertension -not on any scheduled antihypertensives per med history.  BP is controlled. --Monitor.  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  Per chart review no longer on steroids.  Patient BMI: Body mass index is 24.24 kg/m.   DVT prophylaxis: heparin injection 5,000 Units Start: 05/17/21 2345 SCDs Start: 05/17/21 2331   Diet:  Diet Orders (From admission, onward)     Start     Ordered   05/18/21 0823  Diet Carb Modified Fluid consistency: Thin; Room service appropriate? Yes  Diet effective now       Question Answer Comment  Diet-HS Snack? Nothing   Calorie Level Medium 1600-2000   Fluid consistency: Thin   Room service appropriate? Yes      05/18/21 0822              Code Status: Full Code   Subjective 05/18/21    Patient seen in the ED this morning, holding for a bed.  She reported feeling a little bit better.  Reports sore throat and soreness in her chest secondary to coughing.  Denies fevers or chills.  No other acute complaints.   Disposition Plan & Communication   Status is: Inpatient  Remains inpatient appropriate because: Warrants very close monitoring of blood glucose and adjustment of insulin to prevent recurrence of DKA given active infection.    Consults, Procedures, Significant Events   Consultants:  None  Procedures:  None  Antimicrobials:  Anti-infectives (From admission, onward)    Start     Dose/Rate Route Frequency Ordered Stop   05/18/21 1000  oseltamivir (TAMIFLU) capsule 75 mg        75 mg Oral 2 times daily 05/18/21 0153     05/17/21 2200  oseltamivir (TAMIFLU) capsule 30 mg  Status:  Discontinued        30 mg Oral 2 times daily 05/17/21 2145 05/18/21 0153   05/17/21 2045  Ampicillin-Sulbactam (UNASYN) 3 g in sodium  chloride 0.9 % 100 mL IVPB        3 g 200 mL/hr over 30 Minutes Intravenous Every 6 hours 05/17/21 2042           Micro    Objective   Vitals:   05/18/21 1330 05/18/21 1550 05/18/21 1600 05/18/21 1758  BP: 121/89  (!) 120/92 122/88  Pulse: 100  (!) 109 (!) 110  Resp: (!) 28  (!) 28 (!) 26  Temp:   98.2 F (36.8 C) 98.6 F (37 C)  TempSrc:   Oral   SpO2: 94% (!) 82% 94% 93%  Weight:      Height:        Intake/Output Summary (Last 24 hours) at 05/18/2021 1801 Last data filed at 05/17/2021 1915 Gross per 24 hour  Intake 1000 ml  Output --  Net 1000 ml   Filed Weights   05/17/21 1805  Weight: 54.4 kg    Physical Exam:  General exam: awake, alert, no acute distress, mildly ill-appearing HEENT: atraumatic, clear conjunctiva, anicteric sclera, moist mucus membranes, hearing grossly normal  Respiratory system: Decreased breath sounds but overall lungs are clear, no wheezes, rales or rhonchi, normal respiratory effort. Cardiovascular system: normal S1/S2, RRR, no JVD, murmurs, rubs, gallops, no pedal edema.   Gastrointestinal system: soft, NT, ND, no HSM felt, +bowel sounds. Central nervous system: A&O x3. no gross focal neurologic deficits, normal speech Skin: dry, intact, normal temperature Psychiatry: normal mood, congruent affect, judgement and insight appear normal  Labs   Data Reviewed: I have personally reviewed following labs and imaging studies  CBC: Recent Labs  Lab 05/17/21 1805 05/18/21 0718  WBC 13.1* 12.9*  HGB 15.4* 12.0  HCT 45.8 34.6*  MCV 85.6 82.4  PLT 338 373   Basic Metabolic Panel: Recent Labs  Lab 05/17/21 1805 05/17/21 2033 05/18/21 0343 05/18/21 0717  NA 135 137 134* 135  K 4.4 4.7 3.8 3.3*  CL 101 109 107 109  CO2 11* 9* 18* 18*  GLUCOSE 450* 244* 154* 200*  BUN 17 14 9 7   CREATININE 1.31* 1.00 0.80 0.60  CALCIUM 9.8 8.8* 8.4* 8.5*   GFR: Estimated Creatinine Clearance: 83.8 mL/min (by C-G formula based on SCr of 0.6  mg/dL). Liver Function Tests: Recent Labs  Lab 05/17/21 2033  AST 33  ALT 84*  ALKPHOS 117  BILITOT 1.6*  PROT 8.2*  ALBUMIN 3.5   Recent Labs  Lab 05/17/21 2033  LIPASE 29   No results for input(s): AMMONIA in the last 168 hours. Coagulation Profile: No results for input(s): INR, PROTIME in the last 168 hours. Cardiac Enzymes: Recent Labs  Lab 05/18/21 0343  CKTOTAL 37*   BNP (last 3 results) No results for input(s): PROBNP in the last 8760 hours. HbA1C: Recent Labs    05/18/21 0718  HGBA1C 10.5*   CBG: Recent Labs  Lab 05/18/21 0849 05/18/21 0943 05/18/21 1039 05/18/21 1226 05/18/21 1651  GLUCAP  161* 160* 213* 169* 175*   Lipid Profile: No results for input(s): CHOL, HDL, LDLCALC, TRIG, CHOLHDL, LDLDIRECT in the last 72 hours. Thyroid Function Tests: No results for input(s): TSH, T4TOTAL, FREET4, T3FREE, THYROIDAB in the last 72 hours. Anemia Panel: No results for input(s): VITAMINB12, FOLATE, FERRITIN, TIBC, IRON, RETICCTPCT in the last 72 hours. Sepsis Labs: Recent Labs  Lab 05/17/21 2033 05/18/21 0717  LATICACIDVEN 2.1* 1.2    Recent Results (from the past 240 hour(s))  Resp Panel by RT-PCR (Flu A&B, Covid) Nasopharyngeal Swab     Status: Abnormal   Collection Time: 05/17/21  8:33 PM   Specimen: Nasopharyngeal Swab; Nasopharyngeal(NP) swabs in vial transport medium  Result Value Ref Range Status   SARS Coronavirus 2 by RT PCR NEGATIVE NEGATIVE Final    Comment: (NOTE) SARS-CoV-2 target nucleic acids are NOT DETECTED.  The SARS-CoV-2 RNA is generally detectable in upper respiratory specimens during the acute phase of infection. The lowest concentration of SARS-CoV-2 viral copies this assay can detect is 138 copies/mL. A negative result does not preclude SARS-Cov-2 infection and should not be used as the sole basis for treatment or other patient management decisions. A negative result may occur with  improper specimen collection/handling,  submission of specimen other than nasopharyngeal swab, presence of viral mutation(s) within the areas targeted by this assay, and inadequate number of viral copies(<138 copies/mL). A negative result must be combined with clinical observations, patient history, and epidemiological information. The expected result is Negative.  Fact Sheet for Patients:  EntrepreneurPulse.com.au  Fact Sheet for Healthcare Providers:  IncredibleEmployment.be  This test is no t yet approved or cleared by the [place] FDA and  has been authorized for detection and/or diagnosis of SARS-CoV-2 by FDA under an Emergency Use Authorization (EUA). This EUA will remain  in effect (meaning this test can be used) for the duration of the COVID-19 declaration under Section 564(b)(1) of the Act, 21 U.S.C.section 360bbb-3(b)(1), unless the authorization is terminated  or revoked sooner.       Influenza A by PCR POSITIVE (A) NEGATIVE Final   Influenza B by PCR NEGATIVE NEGATIVE Final    Comment: (NOTE) The Xpert Xpress SARS-CoV-2/FLU/RSV plus assay is intended as an aid in the diagnosis of influenza from Nasopharyngeal swab specimens and should not be used as a sole basis for treatment. Nasal washings and aspirates are unacceptable for Xpert Xpress SARS-CoV-2/FLU/RSV testing.  Fact Sheet for Patients: EntrepreneurPulse.com.au  Fact Sheet for Healthcare Providers: IncredibleEmployment.be  This test is not yet approved or cleared by the [place] FDA and has been authorized for detection and/or diagnosis of SARS-CoV-2 by FDA under an Emergency Use Authorization (EUA). This EUA will remain in effect (meaning this test can be used) for the duration of the COVID-19 declaration under Section 564(b)(1) of the Act, 21 U.S.C. section 360bbb-3(b)(1), unless the authorization is terminated or revoked.  Performed at [facility],  [place], [place] [address]   Group A Strep by PCR [facility] Only)     Status: None   Collection Time: 05/17/21  8:33 PM   Specimen: Nasopharyngeal Swab; Sterile Swab  Result Value Ref Range Status   Group A Strep by PCR NOT DETECTED NOT DETECTED Final    Comment: Performed at [facility], [address] [place], [place] [address]      Imaging Studies   DG Chest 2 View  Result Date: 05/17/2021 CLINICAL DATA:  Chest pain and shortness of breath EXAM: CHEST -  2 VIEW COMPARISON:  10/07/2019 FINDINGS: Cardiac shadow is within normal limits. Lungs are well aerated bilaterally. Some mild patchy interstitial opacities are identified bilaterally stable in appearance from the prior exam. Healed rib fracture in the right sixth rib anteriorly is noted. No other bony abnormality is seen. IMPRESSION: Chronic interstitial changes without acute abnormality. Electronically Signed   By: Inez Catalina M.D.   On: 05/17/2021 19:18     Medications   Scheduled Meds:  heparin  5,000 Units Subcutaneous Q8H   insulin aspart  0-9 Units Subcutaneous TID WC   insulin aspart  3 Units Subcutaneous TID WC   insulin glargine-yfgn  15 Units Subcutaneous BID   oseltamivir  75 mg Oral BID   Continuous Infusions:  sodium chloride Stopped (05/18/21 0151)   ampicillin-sulbactam (UNASYN) IV 3 g (05/18/21 1340)   dextrose 5% lactated ringers Stopped (05/18/21 0153)   dextrose 5% lactated ringers Stopped (05/18/21 1223)   insulin Stopped (05/18/21 1223)   lactated ringers Stopped (05/17/21 2310)   lactated ringers Stopped (05/17/21 2337)       LOS: 1 day    Time spent: 30 minutes    Ezekiel Slocumb, DO [facility]  05/18/2021, 6:01 PM      If 7PM-7AM, please contact night-coverage. How to contact the [facility] Attending or Consulting provider [place] or covering provider during after hours [place], for this patient?    Check the care team in [facility] and look for a)  attending/consulting [facility] provider listed and b) the [facility] team listed Log into [email or web address] and use [place]'s universal password to access. If you do not have the password, please contact the hospital operator. Locate the [facility] provider you are looking for under [facility] and page to a number that you can be directly reached. If you still have difficulty reaching the provider, please page the [facility] (Director on Call) for the Hospitalists listed on amion for assistance.

## 2021-05-18 NOTE — ED Notes (Signed)
Spoke with Catherine [name] in pharmacy and requested insulin glargine. Catherine [name] stated that they would walk the insulin here.

## 2021-05-18 NOTE — ED Notes (Signed)
CBG 169. 

## 2021-05-18 NOTE — ED Notes (Signed)
Lb at bedside drawing BMP etc. Attending messaged re: orders for L/A insulin and beta hydroxybuteric acid.

## 2021-05-18 NOTE — ED Notes (Signed)
Placed pt on 2L oxygen. Pt is sleeping and SPO2 was reading 86% with good pleth. Now 97% on 2L.

## 2021-05-18 NOTE — Progress Notes (Addendum)
Inpatient Diabetes Program Recommendations  AACE/ADA: New Consensus Statement on Inpatient Glycemic Control (2015)  Target Ranges:  Prepandial:   less than 140 mg/dL      Peak postprandial:   less than 180 mg/dL (1-2 hours)      Critically ill patients:  140 - 180 mg/dL   Lab Results  Component Value Date   GLUCAP 161 (H) 05/18/2021   HGBA1C 11.9 (H) 02/11/2021    Review of Glycemic Control  Diabetes history: DM1 (makes NO insulin; requires basal, correction, and carbohydrate coverage insulin) Outpatient Diabetes medications: Lantus 30 units QHS, Novolog  Current orders for Inpatient glycemic control: Semglee 15 units bid, Novolog 0-9 units TID with meals, Novolog 0-5 units QHS,  Inpatient Diabetes Program Recommendations:   When patient eating 50% meals: -Add Novolog 3 units tid meal coverage if eats 50% Secure chat sent to Dr. Denton Lank.  DM coordinator spoke with patient on previous admission 02/11/21 regarding diabetes management and elevated A1c.. Patient had appt with endocrinologist Dr. Tedd Sias on 03/02/21  and poorly controlled diabetes addressed with patient.  Thank you, Billy Fischer. Hayes Czaja, RN, MSN, CDE  Diabetes Coordinator Inpatient Glycemic Control Team Team Pager [phone] (8am-5pm) 05/18/2021 9:13 AM

## 2021-05-18 NOTE — ED Notes (Signed)
Pt given breakfast tray at this time. 

## 2021-05-18 NOTE — ED Notes (Signed)
Called pharmacy and requested insulin glargine sent up asap.

## 2021-05-19 LAB — CBC
HCT: 35.6 % — ABNORMAL LOW (ref 36.0–46.0)
Hemoglobin: 12.4 g/dL (ref 12.0–15.0)
MCH: 29.1 pg (ref 26.0–34.0)
MCHC: 34.8 g/dL (ref 30.0–36.0)
MCV: 83.6 fL (ref 80.0–100.0)
Platelets: 248 10*3/uL (ref 150–400)
RBC: 4.26 MIL/uL (ref 3.87–5.11)
RDW: 12.5 % (ref 11.5–15.5)
WBC: 11.2 10*3/uL — ABNORMAL HIGH (ref 4.0–10.5)
nRBC: 0 % (ref 0.0–0.2)

## 2021-05-19 LAB — BASIC METABOLIC PANEL
Anion gap: 12 (ref 5–15)
BUN: 5 mg/dL — ABNORMAL LOW (ref 6–20)
CO2: 21 mmol/L — ABNORMAL LOW (ref 22–32)
Calcium: 8.2 mg/dL — ABNORMAL LOW (ref 8.9–10.3)
Chloride: 104 mmol/L (ref 98–111)
Creatinine, Ser: 0.6 mg/dL (ref 0.44–1.00)
GFR, Estimated: >60 mL/min (ref >60)
Glucose, Bld: 232 mg/dL — ABNORMAL HIGH (ref 70–99)
Potassium: 3.5 mmol/L (ref 3.5–5.1)
Sodium: 137 mmol/L (ref 135–145)

## 2021-05-19 LAB — CBG MONITORING, ED: Glucose-Capillary: 239 mg/dL — ABNORMAL HIGH (ref 70–99)

## 2021-05-19 LAB — GLUCOSE, CAPILLARY: Glucose-Capillary: 204 mg/dL — ABNORMAL HIGH (ref 70–99)

## 2021-05-19 LAB — MAGNESIUM: Magnesium: 1.6 mg/dL — ABNORMAL LOW (ref 1.7–2.4)

## 2021-05-19 MED ORDER — POTASSIUM CHLORIDE CRYS ER 20 MEQ PO TBCR
40.0000 meq | EXTENDED_RELEASE_TABLET | Freq: Once | ORAL | Status: AC
  Administered 2021-05-19: 11:00:00 40 meq via ORAL
  Filled 2021-05-19: qty 2

## 2021-05-19 MED ORDER — MAGNESIUM SULFATE 2 GM/50ML IV SOLN
2.0000 g | Freq: Once | INTRAVENOUS | Status: AC
  Administered 2021-05-19: 11:00:00 2 g via INTRAVENOUS
  Filled 2021-05-19: qty 50

## 2021-05-19 MED ORDER — INSULIN ASPART 100 UNIT/ML IJ SOLN
6.0000 [IU] | Freq: Three times a day (TID) | INTRAMUSCULAR | Status: DC
  Filled 2021-05-19: qty 1

## 2021-05-19 MED ORDER — OSELTAMIVIR PHOSPHATE 75 MG PO CAPS: 75.0000 mg | ORAL_CAPSULE | Freq: Two times a day (BID) | ORAL | 0 refills | Status: AC

## 2021-05-19 MED ORDER — TACROLIMUS 1 MG PO CAPS
2.0000 mg | ORAL_CAPSULE | Freq: Two times a day (BID) | ORAL | Status: DC
  Administered 2021-05-19: 14:00:00 2 mg via ORAL
  Filled 2021-05-19 (×3): qty 2

## 2021-05-19 NOTE — Discharge Summary (Signed)
Physician Discharge Summary  EMELLY WURTZ XMI:[id number] DOB: 2000-11-10 DOA: 05/17/2021  [facility]P: [facility] [place] date: 05/17/2021 Discharge date: 05/19/2021  Admitted From: home Disposition:  home  Recommendations for Outpatient Follow-up:  Follow up with PCP in 1-2 weeks Please obtain BMP/CBC in one week Please follow up on blood glucose control and insulin requirements  Home Health: no  Equipment/Devices: none   Discharge Condition: stable  CODE STATUS: full  Diet recommendation: Carb Modified     Discharge Diagnoses: Principal Problem:   Malaise and fatigue Active Problems:   AKI (acute kidney injury) ([place])   DKA (diabetic ketoacidosis) ([place])   HTN (hypertension)   Type 1 diabetes mellitus with hyperglycemia ([place])   Severe sepsis ([place])   SOB (shortness of breath)   Influenza A    Summary of HPI and Hospital Course:   20 y.o. female seen in ed with complaints of shortness of breath chest pain and diabetes diabetes type 1 presenting with cough congestion sore throat chest discomfort over symptoms of been going on for about 2 days.  Patient tested positive for Influenza A and found to be in DKA.  Admitted to [facility] service and started on Tamiflu and insulin drip.   Diabetic ketoacidosis /type 1 diabetes -DKA present on admission.   Glucose was 825 with metabolic acidosis, anion gap 23, VBG with pH 7.14. Patient on subcutaneous insulin at home.   Regimen appears to be Lantus 30 units at bedtime and NovoLog.   Follows with endocrinologist, Dr. Gabriel Carina. DKA resolved with insulin drip. Transitioned to subcutaneous insulin with Lantus 15 units BID, NovoLog 3 units TID WC plus sliding scale. Appreciate diabetes coordinator assistance. Discussed with patient this morning that her insulin requirements may be slightly higher than normal while she is recovering from acute viral illness.  She agrees to stay in close contact with her PCP and her  endocrinologist.  Severe sepsis and Acute Respiratory Failure with Hypoxia due to Influenza A Shortness of breath /malaise and fatigue - Patient met severe sepsis criteria on admission with tachycardia, tachypnea, lactic acidosis consistent with organ dysfunction and positive for influenza A. Patient had oxygen saturations of 82% on room air, improved on 2 L/min supplemental O2. Started on Tamiflu, discharged with 3 more days to complete 5-day course. Patient was able to be weaned off of oxygen with O2 sats 95 to 100% on room air. Patient advised to contact primary care provider if she develops worsening respiratory symptoms again due to concern for secondary bacterial infection.  Hypokalemia -due to DKA and insulin use.  Potassium was replaced. -- BMP in follow-up in 1 week  Acute kidney injury -resolved.  Presented with creatinine 1.31, baseline less than 1.   AKI likely due to prerenal azotemia in the setting of dehydration due to DKA. AKI resolved with fluids. --Monitor BMP and follow-up  ?History of hypertension -not on any scheduled antihypertensives per med history.   BP is controlled without medications.  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   Per chart review, appears no longer on steroids. Outpatient nephrology follow-up as scheduled.   Discharge Instructions   Discharge Instructions     Call MD for:   Complete by: As directed    Uncontrolled blood sugars Worsening shortness of breath, cough, fevers, sore throat (worsening flu / respiratory symptoms)   Call MD for:  extreme fatigue   Complete by: As directed  Call MD for:  persistant dizziness or light-headedness   Complete by: As directed    Call MD for:  persistant nausea and vomiting   Complete by: As directed    Call MD for:  severe uncontrolled pain   Complete by: As directed    Call MD for:  temperature >100.4   Complete by: As directed     Diet - low sodium heart healthy   Complete by: As directed    Discharge instructions   Complete by: As directed    For Flu - please take Tamiflu for 3 more days, starting this evening.  Please keep a close eye on your Blood Sugars and call your doctor if they are uncontrolled. Since you're starting to feel better from the flu, your sugars should go back to how they normally run. Just watch closely and adjust your insulin as needed to keep them controlled.   --Dr. Nicole Kindred, DO   [facility]   Increase activity slowly   Complete by: As directed       Allergies as of 05/19/2021   No Known Allergies      Medication List     TAKE these medications    insulin aspart 100 UNIT/ML injection Commonly known as: novoLOG Inject 5-8 Units into the skin 3 (three) times daily before meals.   Lantus 100 UNIT/ML injection Generic drug: insulin glargine Inject 30 Units into the skin at bedtime.   oseltamivir 75 MG capsule Commonly known as: TAMIFLU Take 1 capsule (75 mg total) by mouth 2 (two) times daily for 3 days.   tacrolimus 1 MG capsule Commonly known as: PROGRAF Take 2 mg by mouth 2 (two) times daily.        No Known Allergies   If you experience worsening of your admission symptoms, develop shortness of breath, life threatening emergency, suicidal or homicidal thoughts you must seek medical attention immediately by calling 911 or calling your MD immediately  if symptoms less severe.    Please note   You were cared for by a hospitalist during your hospital stay. If you have any questions about your discharge medications or the care you received while you were in the hospital after you are discharged, you can call the unit and asked to speak with the hospitalist on call if the hospitalist that took care of you is not available. Once you are discharged, your primary care physician will handle any further medical issues. Please note that NO REFILLS for any  discharge medications will be authorized once you are discharged, as it is imperative that you return to your primary care physician (or establish a relationship with a primary care physician if you do not have one) for your aftercare needs so that they can reassess your need for medications and monitor your lab values.   Consultations: None   Procedures/Studies: DG Chest 2 View  Result Date: 05/17/2021 CLINICAL DATA:  Chest pain and shortness of breath EXAM: CHEST - 2 VIEW COMPARISON:  10/07/2019 FINDINGS: Cardiac shadow is within normal limits. Lungs are well aerated bilaterally. Some mild patchy interstitial opacities are identified bilaterally stable in appearance from the prior exam. Healed rib fracture in the right sixth rib anteriorly is noted. No other bony abnormality is seen. IMPRESSION: Chronic interstitial changes without acute abnormality. Electronically Signed   By: Inez Catalina M.D.   On: 05/17/2021 19:18       Subjective: Patient seen this morning awake laying in bed.  She reports feeling  much better today.  Denies fevers or chills.  Cough and sore throat improved.  Since she has been using insulin since age 69.  Says she understands well out of adjust her insulin dosing appropriately if blood sugars running high.  Agrees to close outpatient follow-up.   Discharge Exam: Vitals:   05/19/21 0700 05/19/21 0839  BP: (!) 130/93 96/76  Pulse: 90 100  Resp: (!) 23 18  Temp: 98.5 F (36.9 C) 98.7 F (37.1 C)  SpO2: 100% 97%   Vitals:   05/19/21 0400 05/19/21 0600 05/19/21 0700 05/19/21 0839  BP: (!) 126/96 115/90 (!) 130/93 96/76  Pulse:   90 100  Resp: (!) 23 (!) 29 (!) 23 18  Temp:   98.5 F (36.9 C) 98.7 F (37.1 C)  TempSrc:   Oral Oral  SpO2:   100% 97%  Weight:      Height:        General: Pt is alert, awake, not in acute distress Cardiovascular: RRR, S1/S2 +, no rubs, no gallops Respiratory: CTA bilaterally, no wheezing, no rhonchi Abdominal: Soft, NT, ND,  bowel sounds + Extremities: no edema, no cyanosis    The results of significant diagnostics from this hospitalization (including imaging, microbiology, ancillary and laboratory) are listed below for reference.     Microbiology: Recent Results (from the past 240 hour(s))  Resp Panel by RT-PCR (Flu A&B, Covid) Nasopharyngeal Swab     Status: Abnormal   Collection Time: 05/17/21  8:33 PM   Specimen: Nasopharyngeal Swab; Nasopharyngeal(NP) swabs in vial transport medium  Result Value Ref Range Status   SARS Coronavirus 2 by RT PCR NEGATIVE NEGATIVE Final    Comment: (NOTE) SARS-CoV-2 target nucleic acids are NOT DETECTED.  The SARS-CoV-2 RNA is generally detectable in upper respiratory specimens during the acute phase of infection. The lowest concentration of SARS-CoV-2 viral copies this assay can detect is 138 copies/mL. A negative result does not preclude SARS-Cov-2 infection and should not be used as the sole basis for treatment or other patient management decisions. A negative result may occur with  improper specimen collection/handling, submission of specimen other than nasopharyngeal swab, presence of viral mutation(s) within the areas targeted by this assay, and inadequate number of viral copies(<138 copies/mL). A negative result must be combined with clinical observations, patient history, and epidemiological information. The expected result is Negative.  Fact Sheet for Patients:  EntrepreneurPulse.com.au  Fact Sheet for Healthcare Providers:  IncredibleEmployment.be  This test is no t yet approved or cleared by the [place] FDA and  has been authorized for detection and/or diagnosis of SARS-CoV-2 by FDA under an Emergency Use Authorization (EUA). This EUA will remain  in effect (meaning this test can be used) for the duration of the COVID-19 declaration under Section 564(b)(1) of the Act, 21 U.S.C.section 360bbb-3(b)(1), unless  the authorization is terminated  or revoked sooner.       Influenza A by PCR POSITIVE (A) NEGATIVE Final   Influenza B by PCR NEGATIVE NEGATIVE Final    Comment: (NOTE) The Xpert Xpress SARS-CoV-2/FLU/RSV plus assay is intended as an aid in the diagnosis of influenza from Nasopharyngeal swab specimens and should not be used as a sole basis for treatment. Nasal washings and aspirates are unacceptable for Xpert Xpress SARS-CoV-2/FLU/RSV testing.  Fact Sheet for Patients: EntrepreneurPulse.com.au  Fact Sheet for Healthcare Providers: IncredibleEmployment.be  This test is not yet approved or cleared by the [place] FDA and has been authorized for detection and/or diagnosis of SARS-CoV-2  by FDA under an Emergency Use Authorization (EUA). This EUA will remain in effect (meaning this test can be used) for the duration of the COVID-19 declaration under Section 564(b)(1) of the Act, 21 U.S.C. section 360bbb-3(b)(1), unless the authorization is terminated or revoked.  Performed at [facility], [place], [place] [address]   Group A Strep by PCR [facility] Only)     Status: None   Collection Time: 05/17/21  8:33 PM   Specimen: Nasopharyngeal Swab; Sterile Swab  Result Value Ref Range Status   Group A Strep by PCR NOT DETECTED NOT DETECTED Final    Comment: Performed at [facility], [address] [place], [place] [address]  Culture, blood (Routine X 2) w Reflex to ID Panel     Status: None (Preliminary result)   Collection Time: 05/18/21  7:17 AM   Specimen: BLOOD LEFT FOREARM  Result Value Ref Range Status   Specimen Description BLOOD LEFT FOREARM  Final   Special Requests   Final    BOTTLES DRAWN AEROBIC AND ANAEROBIC Blood Culture adequate volume   Culture   Final    NO GROWTH 1 DAY Performed at [facility], [address], [place], [place] [address]    Report Status PENDING  Incomplete   Culture, blood (Routine X 2) w Reflex to ID Panel     Status: None (Preliminary result)   Collection Time: 05/18/21  7:19 AM   Specimen: BLOOD LEFT HAND  Result Value Ref Range Status   Specimen Description BLOOD LEFT HAND  Final   Special Requests   Final    BOTTLES DRAWN AEROBIC AND ANAEROBIC Blood Culture adequate volume   Culture   Final    NO GROWTH 1 DAY Performed at [facility], [address] [place], [place] [address]    Report Status PENDING  Incomplete     Labs: BNP (last 3 results) No results for input(s): BNP in the last 8760 hours. Basic Metabolic Panel: Recent Labs  Lab 05/17/21 1805 05/17/21 2033 05/18/21 0343 05/18/21 0717 05/19/21 0622  NA 135 137 134* 135 137  K 4.4 4.7 3.8 3.3* 3.5  CL 101 109 107 109 104  CO2 11* 9* 18* 18* 21*  GLUCOSE 450* 244* 154* 200* 232*  BUN 17 14 9 7  5*  CREATININE 1.31* 1.00 0.80 0.60 0.60  CALCIUM 9.8 8.8* 8.4* 8.5* 8.2*  MG  --   --   --   --  1.6*   Liver Function Tests: Recent Labs  Lab 05/17/21 2033  AST 33  ALT 84*  ALKPHOS 117  BILITOT 1.6*  PROT 8.2*  ALBUMIN 3.5   Recent Labs  Lab 05/17/21 2033  LIPASE 29   No results for input(s): AMMONIA in the last 168 hours. CBC: Recent Labs  Lab 05/17/21 1805 05/18/21 0718 05/19/21 0622  WBC 13.1* 12.9* 11.2*  HGB 15.4* 12.0 12.4  HCT 45.8 34.6* 35.6*  MCV 85.6 82.4 83.6  PLT 338 277 248   Cardiac Enzymes: Recent Labs  Lab 05/18/21 0343  CKTOTAL 37*   BNP: Invalid input(s): POCBNP CBG: Recent Labs  Lab 05/18/21 1039 05/18/21 1226 05/18/21 1651 05/18/21 2351 05/19/21 0732  GLUCAP 213* 169* 175* 289* 239*   D-Dimer No results for input(s): DDIMER in the last 72 hours. Hgb A1c Recent Labs    05/18/21 0718  HGBA1C 10.5*   Lipid Profile No results for input(s): CHOL, HDL, LDLCALC, TRIG, CHOLHDL, LDLDIRECT in the last  72 hours. Thyroid function studies No results for input(s): TSH, T4TOTAL, T3FREE, THYROIDAB in the last 72  hours.  Invalid input(s): FREET3 Anemia work up No results for input(s): VITAMINB12, FOLATE, FERRITIN, TIBC, IRON, RETICCTPCT in the last 72 hours. Urinalysis    Component Value Date/Time   COLORURINE YELLOW (A) 05/18/2021 0404   APPEARANCEUR HAZY (A) 05/18/2021 0404   LABSPEC 1.014 05/18/2021 0404   PHURINE 5.0 05/18/2021 0404   GLUCOSEU >=500 (A) 05/18/2021 0404   HGBUR SMALL (A) 05/18/2021 0404   BILIRUBINUR NEGATIVE 05/18/2021 0404   KETONESUR 80 (A) 05/18/2021 0404   PROTEINUR 100 (A) 05/18/2021 0404   NITRITE NEGATIVE 05/18/2021 0404   LEUKOCYTESUR TRACE (A) 05/18/2021 0404   Sepsis Labs Invalid input(s): PROCALCITONIN,  WBC,  LACTICIDVEN Microbiology Recent Results (from the past 240 hour(s))  Resp Panel by RT-PCR (Flu A&B, Covid) Nasopharyngeal Swab     Status: Abnormal   Collection Time: 05/17/21  8:33 PM   Specimen: Nasopharyngeal Swab; Nasopharyngeal(NP) swabs in vial transport medium  Result Value Ref Range Status   SARS Coronavirus 2 by RT PCR NEGATIVE NEGATIVE Final    Comment: (NOTE) SARS-CoV-2 target nucleic acids are NOT DETECTED.  The SARS-CoV-2 RNA is generally detectable in upper respiratory specimens during the acute phase of infection. The lowest concentration of SARS-CoV-2 viral copies this assay can detect is 138 copies/mL. A negative result does not preclude SARS-Cov-2 infection and should not be used as the sole basis for treatment or other patient management decisions. A negative result may occur with  improper specimen collection/handling, submission of specimen other than nasopharyngeal swab, presence of viral mutation(s) within the areas targeted by this assay, and inadequate number of viral copies(<138 copies/mL). A negative result must be combined with clinical observations, patient history, and epidemiological information. The expected result is Negative.  Fact Sheet for Patients:  EntrepreneurPulse.com.au  Fact Sheet  for Healthcare Providers:  IncredibleEmployment.be  This test is no t yet approved or cleared by the [place] FDA and  has been authorized for detection and/or diagnosis of SARS-CoV-2 by FDA under an Emergency Use Authorization (EUA). This EUA will remain  in effect (meaning this test can be used) for the duration of the COVID-19 declaration under Section 564(b)(1) of the Act, 21 U.S.C.section 360bbb-3(b)(1), unless the authorization is terminated  or revoked sooner.       Influenza A by PCR POSITIVE (A) NEGATIVE Final   Influenza B by PCR NEGATIVE NEGATIVE Final    Comment: (NOTE) The Xpert Xpress SARS-CoV-2/FLU/RSV plus assay is intended as an aid in the diagnosis of influenza from Nasopharyngeal swab specimens and should not be used as a sole basis for treatment. Nasal washings and aspirates are unacceptable for Xpert Xpress SARS-CoV-2/FLU/RSV testing.  Fact Sheet for Patients: EntrepreneurPulse.com.au  Fact Sheet for Healthcare Providers: IncredibleEmployment.be  This test is not yet approved or cleared by the [place] FDA and has been authorized for detection and/or diagnosis of SARS-CoV-2 by FDA under an Emergency Use Authorization (EUA). This EUA will remain in effect (meaning this test can be used) for the duration of the COVID-19 declaration under Section 564(b)(1) of the Act, 21 U.S.C. section 360bbb-3(b)(1), unless the authorization is terminated or revoked.  Performed at [facility], [place], [place] [address]   Group A Strep by PCR [facility] Only)     Status: None   Collection Time: 05/17/21  8:33 PM   Specimen: Nasopharyngeal Swab; Sterile Swab  Result Value Ref Range  Status   Group A Strep by PCR NOT DETECTED NOT DETECTED Final    Comment: Performed at [facility], [address] [place], [place] [address]  Culture, blood (Routine X 2) w Reflex to ID Panel      Status: None (Preliminary result)   Collection Time: 05/18/21  7:17 AM   Specimen: BLOOD LEFT FOREARM  Result Value Ref Range Status   Specimen Description BLOOD LEFT FOREARM  Final   Special Requests   Final    BOTTLES DRAWN AEROBIC AND ANAEROBIC Blood Culture adequate volume   Culture   Final    NO GROWTH 1 DAY Performed at [facility], [address], [place], [place] [address]    Report Status PENDING  Incomplete  Culture, blood (Routine X 2) w Reflex to ID Panel     Status: None (Preliminary result)   Collection Time: 05/18/21  7:19 AM   Specimen: BLOOD LEFT HAND  Result Value Ref Range Status   Specimen Description BLOOD LEFT HAND  Final   Special Requests   Final    BOTTLES DRAWN AEROBIC AND ANAEROBIC Blood Culture adequate volume   Culture   Final    NO GROWTH 1 DAY Performed at [facility], [address] [place], [place] [address]    Report Status PENDING  Incomplete     Time coordinating discharge: Over 30 minutes  SIGNED:   Ezekiel Slocumb, DO [facility] 05/19/2021, 9:59 AM   If 7PM-7AM, please contact night-coverage [email or web address]

## 2021-05-19 NOTE — Progress Notes (Addendum)
Patient is being discharged home.  Discharge papers given and explained to patient.  Patient verbalized understanding.  Meds reviewed.  No f/u appointments at this time.  Rx sent electronically to pharmacy.  Patient made aware.   Educational information about Influenza and hyperglycemia given and explained to patient.  She verbalized understanding.

## 2021-05-19 NOTE — ED Notes (Signed)
RN informed bed assigned 

## 2021-05-23 LAB — CULTURE, BLOOD (ROUTINE X 2)
Culture: NO GROWTH
Culture: NO GROWTH
Special Requests: ADEQUATE
Special Requests: ADEQUATE

## 2021-07-31 IMAGING — DX DG CHEST 1V
1 series · 1 of 1 positions shown · non-contrast
Comparison: Radiograph 01/25/2020

CLINICAL DATA: Shortness of breath

EXAM:
CHEST  1 VIEW

[chest ap]
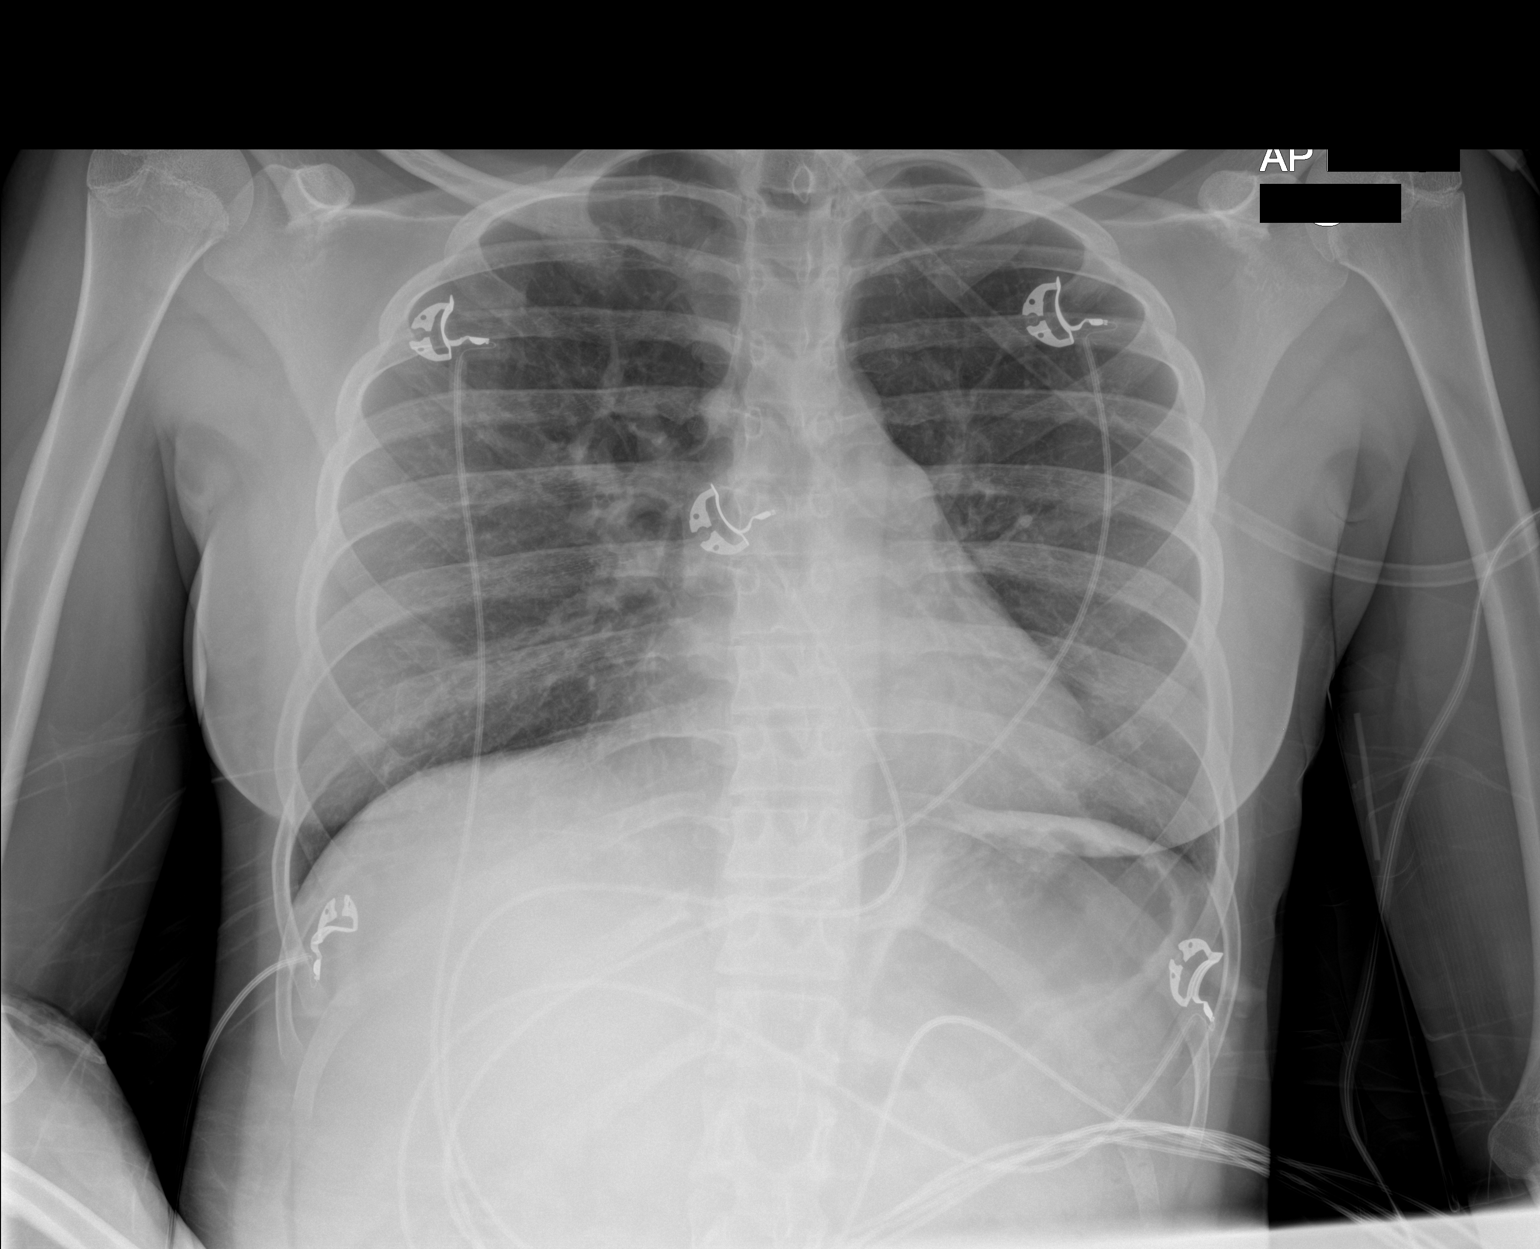

[1 of 1 positions shown; findings below may reference images not displayed]

FINDINGS: Lung volumes are low with some streaky basilar opacities favoring
atelectasis. No focal consolidative process, convincing features of
edema, pneumothorax or effusion. Cardiomediastinal contours are
stable from prior counting for differences in technique. No acute
osseous or soft tissue abnormality. Telemetry leads overlie the
chest. Metallic necklace noted at the base the neck. Nasal cannula
partially imaged.
IMPRESSION: Low lung volumes with some streaky basilar opacities favoring
atelectasis.

## 2021-09-04 ENCOUNTER — Other Ambulatory Visit: Payer: Self-pay

## 2021-09-04 ENCOUNTER — Inpatient Hospital Stay
Admission: EM | Admit: 2021-09-04 | Discharge: 2021-09-06 | DRG: 637 | Disposition: A | Discharge status: Home / Self Care | Payer: Medicaid Other | Attending: Hospitalist | Admitting: Hospitalist

## 2021-09-04 ENCOUNTER — Emergency Department: Payer: Medicaid Other

## 2021-09-04 ENCOUNTER — Inpatient Hospital Stay: Payer: Medicaid Other

## 2021-09-04 DIAGNOSIS — E1065 Type 1 diabetes mellitus with hyperglycemia: Secondary | ICD-10-CM | POA: Diagnosis not present

## 2021-09-04 DIAGNOSIS — I158 Other secondary hypertension: Secondary | ICD-10-CM

## 2021-09-04 DIAGNOSIS — Z79899 Other long term (current) drug therapy: Secondary | ICD-10-CM | POA: Diagnosis not present

## 2021-09-04 DIAGNOSIS — E860 Dehydration: Secondary | ICD-10-CM | POA: Diagnosis present

## 2021-09-04 DIAGNOSIS — N170 Acute kidney failure with tubular necrosis: Secondary | ICD-10-CM | POA: Diagnosis not present

## 2021-09-04 DIAGNOSIS — R739 Hyperglycemia, unspecified: Secondary | ICD-10-CM

## 2021-09-04 DIAGNOSIS — N179 Acute kidney failure, unspecified: Secondary | ICD-10-CM | POA: Diagnosis not present

## 2021-09-04 DIAGNOSIS — Z9114 Patient's other noncompliance with medication regimen: Secondary | ICD-10-CM | POA: Diagnosis not present

## 2021-09-04 DIAGNOSIS — Z20822 Contact with and (suspected) exposure to covid-19: Secondary | ICD-10-CM | POA: Diagnosis present

## 2021-09-04 DIAGNOSIS — Z794 Long term (current) use of insulin: Secondary | ICD-10-CM | POA: Diagnosis not present

## 2021-09-04 DIAGNOSIS — E1021 Type 1 diabetes mellitus with diabetic nephropathy: Secondary | ICD-10-CM | POA: Diagnosis not present

## 2021-09-04 DIAGNOSIS — R601 Generalized edema: Secondary | ICD-10-CM | POA: Diagnosis present

## 2021-09-04 DIAGNOSIS — E8809 Other disorders of plasma-protein metabolism, not elsewhere classified: Secondary | ICD-10-CM | POA: Diagnosis present

## 2021-09-04 DIAGNOSIS — R7989 Other specified abnormal findings of blood chemistry: Secondary | ICD-10-CM

## 2021-09-04 DIAGNOSIS — E1110 Type 2 diabetes mellitus with ketoacidosis without coma: Secondary | ICD-10-CM | POA: Diagnosis present

## 2021-09-04 DIAGNOSIS — E1010 Type 1 diabetes mellitus with ketoacidosis without coma: Secondary | ICD-10-CM | POA: Diagnosis not present

## 2021-09-04 DIAGNOSIS — I10 Essential (primary) hypertension: Secondary | ICD-10-CM | POA: Diagnosis present

## 2021-09-04 LAB — HEPATIC FUNCTION PANEL
ALT: 16 U/L (ref 0–44)
AST: 21 U/L (ref 15–41)
Albumin: <1.5 g/dL — ABNORMAL LOW (ref 3.5–5.0)
Alkaline Phosphatase: 111 U/L (ref 38–126)
Bilirubin, Direct: <0.1 mg/dL (ref 0.0–0.2)
Total Bilirubin: 0.6 mg/dL (ref 0.3–1.2)
Total Protein: 5.7 g/dL — ABNORMAL LOW (ref 6.5–8.1)

## 2021-09-04 LAB — URINALYSIS, ROUTINE W REFLEX MICROSCOPIC
Bilirubin Urine: NEGATIVE
Glucose, UA: >=500 mg/dL — AB
Ketones, ur: 20 mg/dL — AB
Leukocytes,Ua: NEGATIVE
Nitrite: NEGATIVE
Protein, ur: 100 mg/dL — AB
Specific Gravity, Urine: 1.024 (ref 1.005–1.030)
pH: 5 (ref 5.0–8.0)

## 2021-09-04 LAB — BASIC METABOLIC PANEL
Anion gap: 13 (ref 5–15)
Anion gap: 6 (ref 5–15)
BUN: 24 mg/dL — ABNORMAL HIGH (ref 6–20)
BUN: 26 mg/dL — ABNORMAL HIGH (ref 6–20)
CO2: 20 mmol/L — ABNORMAL LOW (ref 22–32)
CO2: 22 mmol/L (ref 22–32)
Calcium: 8 mg/dL — ABNORMAL LOW (ref 8.9–10.3)
Calcium: 7.9 mg/dL — ABNORMAL LOW (ref 8.9–10.3)
Chloride: 98 mmol/L (ref 98–111)
Chloride: 109 mmol/L (ref 98–111)
Creatinine, Ser: 1.1 mg/dL — ABNORMAL HIGH (ref 0.44–1.00)
Creatinine, Ser: 0.78 mg/dL (ref 0.44–1.00)
GFR, Estimated: >60 mL/min (ref >60)
GFR, Estimated: >60 mL/min (ref >60)
Glucose, Bld: 722 mg/dL (ref 70–99)
Glucose, Bld: 164 mg/dL — ABNORMAL HIGH (ref 70–99)
Potassium: 4.6 mmol/L (ref 3.5–5.1)
Potassium: 3.4 mmol/L — ABNORMAL LOW (ref 3.5–5.1)
Sodium: 131 mmol/L — ABNORMAL LOW (ref 135–145)
Sodium: 137 mmol/L (ref 135–145)

## 2021-09-04 LAB — GLUCOSE, CAPILLARY
Glucose-Capillary: 266 mg/dL — ABNORMAL HIGH (ref 70–99)
Glucose-Capillary: 312 mg/dL — ABNORMAL HIGH (ref 70–99)
Glucose-Capillary: 240 mg/dL — ABNORMAL HIGH (ref 70–99)
Glucose-Capillary: 146 mg/dL — ABNORMAL HIGH (ref 70–99)
Glucose-Capillary: 201 mg/dL — ABNORMAL HIGH (ref 70–99)
Glucose-Capillary: 238 mg/dL — ABNORMAL HIGH (ref 70–99)
Glucose-Capillary: 165 mg/dL — ABNORMAL HIGH (ref 70–99)
Glucose-Capillary: 155 mg/dL — ABNORMAL HIGH (ref 70–99)

## 2021-09-04 LAB — PREGNANCY, URINE: Preg Test, Ur: NEGATIVE

## 2021-09-04 LAB — CBC WITH DIFFERENTIAL/PLATELET
Abs Immature Granulocytes: 0.04 10*3/uL (ref 0.00–0.07)
Basophils Absolute: 0.1 10*3/uL (ref 0.0–0.1)
Basophils Relative: 1 %
Eosinophils Absolute: 0.1 10*3/uL (ref 0.0–0.5)
Eosinophils Relative: 1 %
HCT: 43.6 % (ref 36.0–46.0)
Hemoglobin: 14.4 g/dL (ref 12.0–15.0)
Immature Granulocytes: 1 %
Lymphocytes Relative: 15 %
Lymphs Abs: 1.4 10*3/uL (ref 0.7–4.0)
MCH: 28.5 pg (ref 26.0–34.0)
MCHC: 33 g/dL (ref 30.0–36.0)
MCV: 86.2 fL (ref 80.0–100.0)
Monocytes Absolute: 0.3 10*3/uL (ref 0.1–1.0)
Monocytes Relative: 4 %
Neutro Abs: 7 10*3/uL (ref 1.7–7.7)
Neutrophils Relative %: 78 %
Platelets: 299 10*3/uL (ref 150–400)
RBC: 5.06 MIL/uL (ref 3.87–5.11)
RDW: 13.6 % (ref 11.5–15.5)
WBC: 8.9 10*3/uL (ref 4.0–10.5)
nRBC: 0 % (ref 0.0–0.2)

## 2021-09-04 LAB — BLOOD GAS, VENOUS
Acid-base deficit: 8.2 mmol/L — ABNORMAL HIGH (ref 0.0–2.0)
Bicarbonate: 17.9 mmol/L — ABNORMAL LOW (ref 20.0–28.0)
O2 Saturation: 54.6 %
Patient temperature: 37
pCO2, Ven: 38 mmHg — ABNORMAL LOW (ref 44–60)
pH, Ven: 7.28 (ref 7.25–7.43)
pO2, Ven: 36 mmHg (ref 32–45)

## 2021-09-04 LAB — CBG MONITORING, ED
Glucose-Capillary: 463 mg/dL — ABNORMAL HIGH (ref 70–99)
Glucose-Capillary: 573 mg/dL (ref 70–99)
Glucose-Capillary: 333 mg/dL — ABNORMAL HIGH (ref 70–99)

## 2021-09-04 LAB — RESP PANEL BY RT-PCR (FLU A&B, COVID) ARPGX2
Influenza A by PCR: NEGATIVE
Influenza B by PCR: NEGATIVE
SARS Coronavirus 2 by RT PCR: NEGATIVE

## 2021-09-04 LAB — BETA-HYDROXYBUTYRIC ACID
Beta-Hydroxybutyric Acid: 3.99 mmol/L — ABNORMAL HIGH (ref 0.05–0.27)
Beta-Hydroxybutyric Acid: 0.15 mmol/L (ref 0.05–0.27)

## 2021-09-04 LAB — PROTEIN, URINE, RANDOM: Total Protein, Urine: 65 mg/dL

## 2021-09-04 LAB — MRSA NEXT GEN BY PCR, NASAL: MRSA by PCR Next Gen: NOT DETECTED

## 2021-09-04 LAB — CREATININE, URINE, RANDOM: Creatinine, Urine: 18 mg/dL

## 2021-09-04 MED ORDER — INSULIN ASPART 100 UNIT/ML IJ SOLN
0.0000 [IU] | Freq: Three times a day (TID) | INTRAMUSCULAR | Status: DC
  Administered 2021-09-05: 3 [IU] via SUBCUTANEOUS
  Administered 2021-09-05: 8 [IU] via SUBCUTANEOUS
  Administered 2021-09-06: 11 [IU] via SUBCUTANEOUS
  Filled 2021-09-04 (×3): qty 1

## 2021-09-04 MED ORDER — LACTATED RINGERS IV BOLUS
20.0000 mL/kg | Freq: Once | INTRAVENOUS | Status: AC
  Administered 2021-09-04: 1116 mL via INTRAVENOUS

## 2021-09-04 MED ORDER — TACROLIMUS 1 MG PO CAPS
2.0000 mg | ORAL_CAPSULE | Freq: Two times a day (BID) | ORAL | Status: DC
Start: 2021-09-04 — End: 2021-09-06
  Administered 2021-09-04 – 2021-09-06 (×4): 2 mg via ORAL
  Filled 2021-09-04 (×6): qty 2

## 2021-09-04 MED ORDER — DEXTROSE 50 % IV SOLN: 0.0000 mL | INTRAVENOUS | Status: DC | PRN

## 2021-09-04 MED ORDER — INSULIN REGULAR(HUMAN) IN NACL 100-0.9 UT/100ML-% IV SOLN
INTRAVENOUS | Status: DC
  Administered 2021-09-04: 8 [IU]/h via INTRAVENOUS
  Filled 2021-09-04: qty 100

## 2021-09-04 MED ORDER — ONDANSETRON HCL 4 MG/2ML IJ SOLN
4.0000 mg | Freq: Once | INTRAMUSCULAR | Status: AC
  Administered 2021-09-04: 4 mg via INTRAVENOUS
  Filled 2021-09-04: qty 2

## 2021-09-04 MED ORDER — DEXTROSE IN LACTATED RINGERS 5 % IV SOLN: INTRAVENOUS | Status: DC

## 2021-09-04 MED ORDER — CHLORHEXIDINE GLUCONATE CLOTH 2 % EX PADS
6.0000 | MEDICATED_PAD | Freq: Every day | CUTANEOUS | Status: DC
  Administered 2021-09-05: 6 via TOPICAL

## 2021-09-04 MED ORDER — INSULIN ASPART 100 UNIT/ML IJ SOLN
0.0000 [IU] | Freq: Every day | INTRAMUSCULAR | Status: DC
  Administered 2021-09-05: 2 [IU] via SUBCUTANEOUS
  Filled 2021-09-04: qty 1

## 2021-09-04 MED ORDER — LACTATED RINGERS IV BOLUS: 20.0000 mL/kg | Freq: Once | INTRAVENOUS | Status: DC

## 2021-09-04 MED ORDER — SODIUM CHLORIDE 0.9 % IV SOLN: INTRAVENOUS | Status: DC

## 2021-09-04 MED ORDER — ACETAMINOPHEN 325 MG PO TABS
325.0000 mg | ORAL_TABLET | Freq: Four times a day (QID) | ORAL | Status: DC | PRN
  Administered 2021-09-04 – 2021-09-05 (×3): 325 mg via ORAL
  Filled 2021-09-04 (×3): qty 1

## 2021-09-04 MED ORDER — INSULIN DETEMIR 100 UNIT/ML ~~LOC~~ SOLN
18.0000 [IU] | SUBCUTANEOUS | Status: DC
  Administered 2021-09-04: 18 [IU] via SUBCUTANEOUS
  Filled 2021-09-04 (×2): qty 0.18

## 2021-09-04 MED ORDER — MORPHINE SULFATE (PF) 4 MG/ML IV SOLN
4.0000 mg | Freq: Once | INTRAVENOUS | Status: AC
  Administered 2021-09-04: 4 mg via INTRAVENOUS
  Filled 2021-09-04: qty 1

## 2021-09-04 NOTE — Assessment & Plan Note (Addendum)
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

## 2021-09-04 NOTE — H&P (Addendum)
HISTORY AND PHYSICAL  Patient: Catherine [name] 21 y.o. female MRN: [id number]  Today is hospital day 0 after admission on 09/04/2021 10:15 AM  RECORD [place] COURSE: Catherine [name] is a 21 y.o. female with a history of type 1 diabetes on insulin, nephrotic syndrome, and hypertension who presented to ED 09/04/21 with swelling over the last several days mainly in her abdomen, and similar to prior flares of her nephrotic syndrome.  In addition: nausea and vomiting, decreased appetite, and back pain.  NO cough, fever, diarrhea, or urinary symptoms, she endorses compliance with her tacrolimus and insulin. In ED 09/04/21: Glc 722, Cr 1.1, Beta Hydroxybutyric acid 3.99, hypoalminuemic, urine collection pending. Given insulin infusion, IV fluids, zofran, morphine.   Procedures and Significant Results:  CXR 09/04/21  no focal consolidation or edema.  Radiology report notes a linear opacity in the right midlung  Consultants:  Nephrology to see 09/05/2021    SUBJECTIVE:  Patient seen and examined resting comfortably in ED.  She confirms above history, chief complaint swelling/abdominal bloating over the past few days similar to previous flares of her nephrotic syndrome.  Endorses compliance with home medications.  Reports some abdominal pain/shortness of breath which has improved since she has been in the ED. Her primary complaint at this point as she feels thirsty/dehydrated.     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 Significant hypoalbuminemia, pending urine collection but given history likely renal wasting albumin Pt reports compliance w/ home  tacrolimus  Consult to nephrology   HTN (hypertension) Controlled, montitor  Type 1 diabetes mellitus with hyperglycemia ([place]) See above re: DKA  Pseudohyponatremia No concerns  Sodium corrects to WNL for hyperglycemia    VTE Ppx: LOVENOX CODE STATUS   Code Status: Full Code Admitted from: home Expected Dispo: home Barriers to discharge: continued medical treatment, await nephro recs re: nephrotic syndrome outpatient movement  Family communication: none at this time, pt declines update to family, she's been in contact w/ her mother already and has no questions               Past Medical History:  Diagnosis Date   Diabetes mellitus without complication ([place])    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 No pertinent surgical history.  History reviewed. No pertinent family history. Social History:  reports that she has never smoked. She has never used smokeless tobacco. She reports that she does not currently use alcohol. She reports that she does not currently use drugs.  Allergies: No Known Allergies   No current facility-administered medications on file prior to encounter.   Current Outpatient Medications on File Prior to Encounter  Medication Sig Dispense Refill   acetaminophen (TYLENOL) 325 MG tablet Take 325 mg by mouth every 6 (six) hours as needed.     atorvastatin (LIPITOR) 40 MG tablet Take 40 mg by mouth daily as needed.     furosemide (LASIX) 20 MG tablet Take 20 mg by mouth daily as needed.     glucagon 1 MG injection Inject 1 mg into the muscle as needed.     insulin aspart (  NOVOLOG) 100 UNIT/ML injection Inject 5-8 Units into the skin 3 (three) times daily before meals.     LANTUS 100 UNIT/ML injection Inject 30 Units into the skin at bedtime.      tacrolimus (PROGRAF) 1 MG capsule Take 2 mg by mouth 2 (two) times daily.     torsemide (DEMADEX) 20 MG tablet Take 20 mg by mouth See  admin instructions. Take 1 tablet (20mg  total) by mouth 2 times daily - go home and weigh yourself. This is your 'dry weight'. - you should weight yourself daily. You should take an extra dose of your diuretic (torsemide) if the following occur: - If your weight is up by 3 pounds in one day or 5 pounds in one week. - If you are experiencing more swelling than normal in both your ankles. - If you are experiencing gradually worsening trouble breathing. - if taking an extra dose of your diuretic does not give you relief and / or improve your weight, then please call your PCP and / or cardiologist for instructions. - If you feel lightheaded or dizzy please do not take your diuretic.      Results for orders placed or performed during the hospital encounter of 09/04/21 (from the past 48 hour(s))  Basic metabolic panel     Status: Abnormal   Collection Time: 09/04/21 10:56 AM  Result Value Ref Range   Sodium 131 (L) 135 - 145 mmol/L   Potassium 4.6 3.5 - 5.1 mmol/L   Chloride 98 98 - 111 mmol/L   CO2 20 (L) 22 - 32 mmol/L   Glucose, Bld 722 (HH) 70 - 99 mg/dL    Comment: CRITICAL RESULT CALLED TO, READ BACK BY AND VERIFIED WITH BRANDON GROGG AT 1132 09/04/21.PMF Glucose reference range applies only to samples taken after fasting for at least 8 hours.    BUN 24 (H) 6 - 20 mg/dL   Creatinine, Ser 1.10 (H) 0.44 - 1.00 mg/dL   Calcium 8.0 (L) 8.9 - 10.3 mg/dL   GFR, Estimated >60 >60 mL/min    Comment: (NOTE) Calculated using the CKD-EPI Creatinine Equation (2021)    Anion gap 13 5 - 15    Comment: Performed at [facility], [address] [place], [place] [address]  CBC with Differential     Status: None   Collection Time: 09/04/21 10:56 AM  Result Value Ref Range   WBC 8.9 4.0 - 10.5 K/uL   RBC 5.06 3.87 - 5.11 MIL/uL   Hemoglobin 14.4 12.0 - 15.0 g/dL   HCT 43.6 36.0 - 46.0 %   MCV 86.2 80.0 - 100.0 fL   MCH 28.5 26.0 - 34.0 pg   MCHC 33.0 30.0 - 36.0 g/dL   RDW 13.6 11.5 - 15.5 %    Platelets 299 150 - 400 K/uL   nRBC 0.0 0.0 - 0.2 %   Neutrophils Relative % 78 %   Neutro Abs 7.0 1.7 - 7.7 K/uL   Lymphocytes Relative 15 %   Lymphs Abs 1.4 0.7 - 4.0 K/uL   Monocytes Relative 4 %   Monocytes Absolute 0.3 0.1 - 1.0 K/uL   Eosinophils Relative 1 %   Eosinophils Absolute 0.1 0.0 - 0.5 K/uL   Basophils Relative 1 %   Basophils Absolute 0.1 0.0 - 0.1 K/uL   Immature Granulocytes 1 %   Abs Immature Granulocytes 0.04 0.00 - 0.07 K/uL    Comment: Performed at [facility], [address] [place], [place] [address]  Beta-hydroxybutyric acid     Status: Abnormal   Collection Time: 09/04/21 10:56 AM  Result Value Ref Range   Beta-Hydroxybutyric Acid 3.99 (H) 0.05 - 0.27 mmol/L    Comment: Performed at [facility], [address] [place], [place] [address]  Hepatic function panel     Status: Abnormal   Collection Time: 09/04/21 10:56 AM  Result Value Ref Range   Total Protein 5.7 (L) 6.5 - 8.1 g/dL   Albumin <1.5 (L) 3.5 - 5.0 g/dL   AST 21 15 - 41 U/L   ALT 16 0 - 44 U/L   Alkaline Phosphatase 111 38 - 126 U/L   Total Bilirubin 0.6 0.3 - 1.2 mg/dL   Bilirubin, Direct <0.1 0.0 - 0.2 mg/dL   Indirect Bilirubin NOT CALCULATED 0.3 - 0.9 mg/dL    Comment: Performed at [facility], [address] [place], [place] [address]  Blood gas, venous     Status: Abnormal   Collection Time: 09/04/21 12:30 PM  Result Value Ref Range   pH, Ven 7.28 7.25 - 7.43   pCO2, Ven 38 (L) 44 - 60 mmHg   pO2, Ven 36 32 - 45 mmHg   Bicarbonate 17.9 (L) 20.0 - 28.0 mmol/L   Acid-base deficit 8.2 (H) 0.0 - 2.0 mmol/L   O2 Saturation 54.6 %   Patient temperature 37.0    Collection site VEIN     Comment: Performed at [facility], [address] [place], [place] [address]  Resp Panel by RT-PCR (Flu A&B, Covid) Nasopharyngeal Swab     Status: None   Collection Time: 09/04/21 12:30 PM   Specimen: Nasopharyngeal Swab; Nasopharyngeal(NP) swabs in vial  transport medium  Result Value Ref Range   SARS Coronavirus 2 by RT PCR NEGATIVE NEGATIVE    Comment: (NOTE) SARS-CoV-2 target nucleic acids are NOT DETECTED.  The SARS-CoV-2 RNA is generally detectable in upper respiratory specimens during the acute phase of infection. The lowest concentration of SARS-CoV-2 viral copies this assay can detect is 138 copies/mL. A negative result does not preclude SARS-Cov-2 infection and should not be used as the sole basis for treatment or other patient management decisions. A negative result may occur with  improper specimen collection/handling, submission of specimen other than nasopharyngeal swab, presence of viral mutation(s) within the areas targeted by this assay, and inadequate number of viral copies(<138 copies/mL). A negative result must be combined with clinical observations, patient history, and epidemiological information. The expected result is Negative.  Fact Sheet for Patients:  EntrepreneurPulse.com.au  Fact Sheet for Healthcare Providers:  IncredibleEmployment.be  This test is no t yet approved or cleared by the [place] FDA and  has been authorized for detection and/or diagnosis of SARS-CoV-2 by FDA under an Emergency Use Authorization (EUA). This EUA will remain  in effect (meaning this test can be used) for the duration of the COVID-19 declaration under Section 564(b)(1) of the Act, 21 U.S.C.section 360bbb-3(b)(1), unless the authorization is terminated  or revoked sooner.       Influenza A by PCR NEGATIVE NEGATIVE   Influenza B by PCR NEGATIVE NEGATIVE    Comment: (NOTE) The Xpert Xpress SARS-CoV-2/FLU/RSV plus assay is intended as an aid in the diagnosis of influenza from Nasopharyngeal swab specimens and should not be used as a sole basis for treatment. Nasal washings and aspirates are unacceptable for Xpert Xpress SARS-CoV-2/FLU/RSV testing.  Fact Sheet for  Patients: EntrepreneurPulse.com.au  Fact Sheet for Healthcare Providers: IncredibleEmployment.be  This test  is not yet approved or cleared by the [place] and has been authorized for detection and/or diagnosis of SARS-CoV-2 by FDA under an Emergency Use Authorization (EUA). This EUA will remain in effect (meaning this test can be used) for the duration of the COVID-19 declaration under Section 564(b)(1) of the Act, 21 U.S.C. section 360bbb-3(b)(1), unless the authorization is terminated or revoked.  Performed at [facility], [address] [place], [place] [address]   Urinalysis, Routine w reflex microscopic Nasopharyngeal Swab     Status: Abnormal   Collection Time: 09/04/21 12:30 PM  Result Value Ref Range   Color, Urine STRAW (A) YELLOW   APPearance HAZY (A) CLEAR   Specific Gravity, Urine 1.024 1.005 - 1.030   pH 5.0 5.0 - 8.0   Glucose, UA >=500 (A) NEGATIVE mg/dL   Hgb urine dipstick SMALL (A) NEGATIVE   Bilirubin Urine NEGATIVE NEGATIVE   Ketones, ur 20 (A) NEGATIVE mg/dL   Protein, ur 100 (A) NEGATIVE mg/dL   Nitrite NEGATIVE NEGATIVE   Leukocytes,Ua NEGATIVE NEGATIVE   RBC / HPF 0-5 0 - 5 RBC/hpf   WBC, UA 0-5 0 - 5 WBC/hpf   Bacteria, UA RARE (A) NONE SEEN   Squamous Epithelial / LPF 6-10 0 - 5    Comment: Performed at [facility], [address] [place], [place] [address]  Pregnancy, urine     Status: None   Collection Time: 09/04/21 12:58 PM  Result Value Ref Range   Preg Test, Ur NEGATIVE NEGATIVE    Comment: Performed at [facility], [address], [place],  [address]   DG Chest 2 View  Result Date: 09/04/2021 CLINICAL DATA:  21 year old female with history of shortness of breath with exertion. EXAM: CHEST - 2 VIEW COMPARISON:  Chest x-ray 05/17/2021. FINDINGS: Unusual linear opacity in the periphery of the right mid to lower lung, new compared to prior examinations. Left  lung is clear. No pleural effusions. No pneumothorax. No evidence of pulmonary edema. Heart size is normal. Upper mediastinal contours are within normal limits. IMPRESSION: 1. Unusual linear opacity in the periphery of the right mid to lower lung, potentially an area of atelectasis or developing scarring. Electronically Signed   By: Vinnie Langton M.D.   On: 09/04/2021 11:36    Review of Systems  Constitutional:  Positive for appetite change, fatigue and unexpected weight change.  Respiratory:  Positive for shortness of breath. Negative for cough and chest tightness.   Cardiovascular:  Positive for leg swelling. Negative for chest pain.  Gastrointestinal:  Positive for abdominal distention, abdominal pain, nausea and vomiting.  Genitourinary:  Negative for difficulty urinating.  Musculoskeletal:  Negative for gait problem.  Neurological:  Negative for dizziness, syncope and light-headedness.  Psychiatric/Behavioral:  Negative for confusion.    Blood pressure 123/83, pulse 97, temperature 97.8 F (36.6 C), temperature source Oral, resp. rate (!) 22, height 4\' 11"  (1.499 m), weight 55.8 kg, SpO2 97 %. Physical Exam Constitutional:      General: She is not in acute distress.    Appearance: Normal appearance.  HENT:     Head: Normocephalic and atraumatic.  Cardiovascular:     Rate and Rhythm: Normal rate and regular rhythm.  Pulmonary:     Effort: Pulmonary effort is normal.     Breath sounds: Rales (faint at bases lungs, possible atelectasis) present.  Abdominal:     General: Bowel sounds are normal.     Palpations: There is no mass.  Tenderness: There is no abdominal tenderness.  Musculoskeletal:     Cervical back: Normal range of motion and neck supple.     Right lower leg: No edema.     Left lower leg: No edema.  Skin:    General: Skin is warm and dry.  Neurological:     General: No focal deficit present.     Mental Status: She is alert and oriented to person, place, and  time.      Emeterio Reeve, DO 09/04/2021, 2:09 PM

## 2021-09-04 NOTE — Progress Notes (Signed)
Catherine [name]  MRN: [id number]  DOB/AGE: 25-Jul-2000 21 y.o.  Primary Care Physician:[facility]  Admit date: 09/04/2021  Chief Complaint:  Chief Complaint  Patient presents with   kidney issues    S-Pt presented on  09/04/2021 with  Chief Complaint  Patient presents with   kidney issues  .  Catherine [name] is a 21 y.o. female with a history of type 1 diabetes on insulin, nephrotic syndrome, and hypertension who presented to ED 09/04/21 with swelling over the last several days mainly in her abdomen, and similar to prior flares of her nephrotic syndrome.   Upon evaluation in the ER patient was found to have DKA with blood glucose of 722, patient's beta hydroxy butyric acid was high and patient was admitted for further care.  Nephrology was consulted for patient's nephrotic syndrome Patient was seen today in the ER Patient continues to complain of abdominal fullness from her flareup Patient complains of decreased appetite Patient complains of her back being tight No complaint of fever No complaint of chest pain No complaint of shortness of breath Patient gave a history that she is indeed compliant with her tacrolimus and insulin. Patient was seen by Dr. Wynelle Link in office on March 2021 before that patient was seen as an inpatient when patient was admitted for DKA and February 2021. Patient in the past was seen by [facility] and was recommended to have rituximab treatment during the discussion patient informed me that she never received any infusion   Medications outpatient  tacrolimus  2 mg Oral BID         [id number] from the symptoms mentioned above,there are no other symptoms referable to all systems reviewed.  Physical Exam: Vital signs in last 24 hours: Temp:  [97.8 F (36.6 C)] 97.8 F (36.6 C) (03/05 1018) Pulse Rate:  [94-106] 97 (03/05 1245) Resp:  [18-26] 22 (03/05 1245) BP: (104-123)/(62-84) 123/83 (03/05 1238) SpO2:  [97 %-100 %]  97 % (03/05 1245) Weight:  [55.8 kg] 55.8 kg (03/05 1016) Weight change:     Intake/Output from previous day: No intake/output data recorded. No intake/output data recorded.   Physical Exam:  General- pt is awake,alert, oriented to time place and person  Resp- No acute REsp distress, Decreased at bases  CVS- S1S2 regular in rate and rhythm  GIT- BS+, soft, Non tender , distended  EXT- 1+  LE Edema,  No Cyanosis    Lab Results:  CBC  Recent Labs    09/04/21 1056  WBC 8.9  HGB 14.4  HCT 43.6  PLT 299    BMET  Recent Labs    09/04/21 1056  NA 131*  K 4.6  CL 98  CO2 20*  GLUCOSE 722*  BUN 24*  CREATININE 1.10*  CALCIUM 8.0*      Most recent Creatinine trend  Lab Results  Component Value Date   CREATININE 1.10 (H) 09/04/2021   CREATININE 0.60 05/19/2021   CREATININE 0.60 05/18/2021      MICRO   Recent Results (from the past 240 hour(s))  Resp Panel by RT-PCR (Flu A&B, Covid) Nasopharyngeal Swab     Status: None   Collection Time: 09/04/21 12:30 PM   Specimen: Nasopharyngeal Swab; Nasopharyngeal(NP) swabs in vial transport medium  Result Value Ref Range Status   SARS Coronavirus 2 by RT PCR NEGATIVE NEGATIVE Final    Comment: (NOTE) SARS-CoV-2 target nucleic acids are NOT DETECTED.  The SARS-CoV-2 RNA is generally detectable in upper respiratory specimens  during the acute phase of infection. The lowest concentration of SARS-CoV-2 viral copies this assay can detect is 138 copies/mL. A negative result does not preclude SARS-Cov-2 infection and should not be used as the sole basis for treatment or other patient management decisions. A negative result may occur with  improper specimen collection/handling, submission of specimen other than nasopharyngeal swab, presence of viral mutation(s) within the areas targeted by this assay, and inadequate number of viral copies(<138 copies/mL). A negative result must be combined with clinical  observations, patient history, and epidemiological information. The expected result is Negative.  Fact Sheet for Patients:  BloggerCourse.com  Fact Sheet for Healthcare Providers:  SeriousBroker.it  This test is no t yet approved or cleared by the [place] FDA and  has been authorized for detection and/or diagnosis of SARS-CoV-2 by FDA under an Emergency Use Authorization (EUA). This EUA will remain  in effect (meaning this test can be used) for the duration of the COVID-19 declaration under Section 564(b)(1) of the Act, 21 U.S.C.section 360bbb-3(b)(1), unless the authorization is terminated  or revoked sooner.       Influenza A by PCR NEGATIVE NEGATIVE Final   Influenza B by PCR NEGATIVE NEGATIVE Final    Comment: (NOTE) The Xpert Xpress SARS-CoV-2/FLU/RSV plus assay is intended as an aid in the diagnosis of influenza from Nasopharyngeal swab specimens and should not be used as a sole basis for treatment. Nasal washings and aspirates are unacceptable for Xpert Xpress SARS-CoV-2/FLU/RSV testing.  Fact Sheet for Patients: BloggerCourse.com  Fact Sheet for Healthcare Providers: SeriousBroker.it  This test is not yet approved or cleared by the [place] FDA and has been authorized for detection and/or diagnosis of SARS-CoV-2 by FDA under an Emergency Use Authorization (EUA). This EUA will remain in effect (meaning this test can be used) for the duration of the COVID-19 declaration under Section 564(b)(1) of the Act, 21 U.S.C. section 360bbb-3(b)(1), unless the authorization is terminated or revoked.  Performed at [facility], [address], [place], [place] [address]          Impression:  Patient is a 21 year old African-American female with a past medical history of diabetes mellitus, minimal-change disease, history of frequent admission and flareup  of nephrotic syndrome who is being admitted with -DKA -Acute kidney injury -Nephrotic syndrome -Type 1 diabetes mellitus -Hyponatremia   1)Renal    Acute kidney injury Patient has AKI secondary to ATN Patient has AKI secondary to DKA as patient creatinine has increased from her baseline of 0.6 to now 1.1 Patient has acute kidney injury on CKD 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   2)Pre Hypertension   Blood pressure is stable    3)Anemia of chronic disease  CBC Latest Ref Rng & Units 09/04/2021 05/19/2021 05/18/2021  WBC 4.0 - 10.5 K/uL 8.9 11.2(H) 12.9(H)  Hemoglobin 12.0 - 15.0 g/dL 38.9 37.3 42.8  Hematocrit 36.0 - 46.0 % 43.6 35.6(L) 34.6(L)  Platelets 150 - 400 K/uL  299 248 277       HGb at goal (9--11)   4) Hypophosphatemia We will encourage oral intake We will follow Lab Results  Component Value Date   CALCIUM 8.0 (L) 09/04/2021   PHOS 1.7 (L) 07/30/2019    Patient calcium when  corrected for albumin is at goal   5) Nephrotic syndrome Patient is now presenting with albumin of less than 1.5   6) Electrolytes   BMP Latest Ref Rng & Units 09/04/2021 05/19/2021 05/18/2021  Glucose 70 - 99 mg/dL 981(XB) 147(W) 295(A)  BUN 6 - 20 mg/dL 21(H) 5(L) 7  Creatinine 0.44 - 1.00 mg/dL 0.86(V) 7.84 6.96  Sodium 135 - 145 mmol/L 131(L) 137 135  Potassium 3.5 - 5.1 mmol/L 4.6 3.5 3.3(L)  Chloride 98 -  111 mmol/L 98 104 109  CO2 22 - 32 mmol/L 20(L) 21(L) 18(L)  Calcium 8.9 - 10.3 mg/dL 8.0(L) 8.2(L) 8.5(L)     Sodium Hyponatremia secondary to uncontrolled hyperglycemia Patient is currently on insulin drip that should help We will follow   Potassium Normokalemic    7)Acute metabolic acidosis Patient has acidosis secondary to DKA  Patient is on DKA management that should help   8) Type 1 diabetes mellitus with DKA Patient is being admitted with DKA Patient is on insulin drip Patient is being followed with the primary team  Plan:  We will ask for autoimmune work-up for completion We will start patient on prednisone of 50 mg in the morning The reason we are not starting it tonight is because at this time patient is in diabetic ketoacidosis and steroids will not help We will quantify patient's proteinuria Patient will most likely need another biopsy Patient will need rituximab as an outpatient as well      Adalina Dopson s [facility] 09/04/2021, 3:23 PM

## 2021-09-04 NOTE — ED Provider Notes (Signed)
? ?[facility] ?Provider Note ? ? ? Event Date/Time  ? First MD Initiated Contact with Patient 09/04/21 1022   ?  (approximate) ? ? ?History  ? ?kidney issues ? ? ?HPI ? ?Catherine [name] is a 21 y.o. female with a history of type 1 diabetes on insulin, nephrotic syndrome, and hypertension who presents with swelling over the last several days mainly in her abdomen, and similar to prior flares of her nephrotic syndrome.  In addition she reports nausea and vomiting, decreased appetite, and back pain.  She denies cough, fever, diarrhea, or urinary symptoms.  The patient states that she has been compliant with her tacrolimus and insulin. ? ? ?Physical Exam  ? ?Triage Vital Signs: ?ED Triage Vitals  ?Enc Vitals Group  ?   BP 09/04/21 1018 120/81  ?   Pulse Rate 09/04/21 1018 (!) 106  ?   Resp 09/04/21 1018 18  ?   Temp 09/04/21 1018 97.8 ?F (36.6 ?C)  ?   Temp Source 09/04/21 1018 Oral  ?   SpO2 09/04/21 1018 100 %  ?   Weight 09/04/21 1016 123 lb (55.8 kg)  ?   Height 09/04/21 1016 4\' 11"  (1.499 m)  ?   Head Circumference --   ?   Peak Flow --   ?   Pain Score 09/04/21 1016 7  ?   Pain Loc --   ?   Pain Edu? --   ?   Excl. in GC? --   ? ? ?Most recent vital signs: ?Vitals:  ? 09/04/21 1100 09/04/21 1130  ?BP: 123/84 104/62  ?Pulse: (!) 104 96  ?Resp:    ?Temp:    ?SpO2: 97% 100%  ? ? ? ?General: Alert and oriented, relatively comfortable appearing. ?CV:  Good peripheral perfusion.  ?Resp:  Normal effort.  Lungs CTAB. ?Abd:  Soft with mild diffuse tenderness.  No peritoneal signs.  Mild distention ?Other:  No significant peripheral edema.  No jaundice. ? ? ?ED Results / Procedures / Treatments  ? ?Labs ?(all labs ordered are listed, but only abnormal results are displayed) ?Labs Reviewed  ?BASIC METABOLIC PANEL - Abnormal; Notable for the following components:  ?    Result Value  ? Sodium 131 (*)   ? CO2 20 (*)   ? Glucose, Bld 722 (*)   ? BUN 24 (*)   ? Creatinine, Ser 1.10 (*)   ? Calcium 8.0  (*)   ? All other components within normal limits  ?BETA-HYDROXYBUTYRIC ACID - Abnormal; Notable for the following components:  ? Beta-Hydroxybutyric Acid 3.99 (*)   ? All other components within normal limits  ?HEPATIC FUNCTION PANEL - Abnormal; Notable for the following components:  ? Total Protein 5.7 (*)   ? Albumin <1.5 (*)   ? All other components within normal limits  ?RESP PANEL BY RT-PCR (FLU A&B, COVID) ARPGX2  ?CBC WITH DIFFERENTIAL/PLATELET  ?BLOOD GAS, VENOUS  ?URINALYSIS, ROUTINE W REFLEX MICROSCOPIC  ?POC URINE PREG, ED  ? ? ? ?EKG ? ? ? ?RADIOLOGY ? ?Chest x-ray: I independently viewed and interpreted the images; there is no focal consolidation or edema.  Radiology report notes a linear opacity in the right midlung. ? ?PROCEDURES: ? ?Critical Care performed: Yes, see critical care procedure note(s) ? ?.Critical Care ?Performed by: 11/04/21, MD ?Authorized by: Dionne Bucy, MD  ? ?Critical care provider statement:  ?  Critical care time (minutes):  20 ?  Critical care was necessary to  treat or prevent imminent or life-threatening deterioration of the following conditions:  Dehydration and endocrine crisis ?  Critical care was time spent personally by me on the following activities:  Development of treatment plan with patient or surrogate, discussions with consultants, evaluation of patient's response to treatment, examination of patient, ordering and review of laboratory studies, ordering and review of radiographic studies, ordering and performing treatments and interventions, pulse oximetry, re-evaluation of patient's condition and review of old charts ?  Care discussed with: admitting provider   ? ? ?MEDICATIONS ORDERED IN ED: ?Medications  ?insulin regular, human (MYXREDLIN) 100 units/ 100 mL infusion (8 Units/hr Intravenous New Bag/Given 09/04/21 1225)  ?dextrose 5 % in lactated ringers infusion (0 mLs Intravenous Hold 09/04/21 1151)  ?dextrose 50 % solution 0-50 mL (has no  administration in time range)  ?0.9 %  sodium chloride infusion ( Intravenous New Bag/Given 09/04/21 1223)  ?ondansetron (ZOFRAN) injection 4 mg (4 mg Intravenous Given 09/04/21 1226)  ?morphine (PF) 4 MG/ML injection 4 mg (4 mg Intravenous Given 09/04/21 1226)  ?lactated ringers bolus 1,116 mL (1,116 mLs Intravenous New Bag/Given 09/04/21 1235)  ? ? ? ?IMPRESSION / MDM / ASSESSMENT AND PLAN / ED COURSE  ?I reviewed the triage vital signs and the nursing notes. ? ?21 year old female with PMH as noted above presents with swelling, nausea and vomiting, decreased appetite over the last several days. ? ?On exam the patient is overall well-appearing.  She is slightly tachycardic with otherwise normal vital signs.  She has mild distention to her abdomen but otherwise no significant edema. ? ?Differential diagnosis includes, but is not limited to, nephrotic syndrome, DKA, other hyperglycemic crisis, AKI, electrolyte abnormality other metabolic disturbance, UTI or other acute infection. ? ?We will obtain lab work-up and reassess. ? ?The patient is on the cardiac monitor to evaluate for evidence of arrhythmia and/or significant heart rate changes. ? ?----------------------------------------- ?12:35 PM on 09/04/2021 ?----------------------------------------- ? ?Glucose is significantly elevated, as is the beta hydroxybutyrate, although with only minimally elevated anion gap.  Work-up is concerning for HHS or developing DKA.  The patient's creatinine is mildly elevated from baseline.  Urinalysis is still pending. ? ?I ordered IV fluids and an insulin infusion.  The patient remains alert and relatively comfortable appearing.  She requires admission, butb appears stable for the floor and does not need ICU level care at this time.  I consulted Dr. Lyn Hollingshead from the hospitalist service; based on our discussion she agrees to admit the patient. ? ?FINAL CLINICAL IMPRESSION(S) / ED DIAGNOSES  ? ?Final diagnoses:  ?Hyperglycemia  ? ? ? ?Rx /  DC Orders  ? ?ED Discharge Orders   ? ? None  ? ?  ? ? ? ?Note:  This document was prepared using Dragon voice recognition software and may include unintentional dictation errors.  ?  Dionne Bucy, MD ?09/04/21 1237 ? ?

## 2021-09-04 NOTE — ED Triage Notes (Addendum)
Pt comes pov with "nephritis syndrome flair up". States her kidneys have been hurting, stomach is swollen, face is swollen. Has been going on about a week. Hasn't been eating or drinking well. T1D, most recent cbg 220 at home.  ?

## 2021-09-04 NOTE — Plan of Care (Signed)

## 2021-09-04 NOTE — Assessment & Plan Note (Signed)
No concerns  ?Sodium corrects to WNL for hyperglycemia ?

## 2021-09-04 NOTE — Assessment & Plan Note (Addendum)
--  off insulin gtt, transitioned to subQ insulin ?--pt reported compliance with home insulin ?

## 2021-09-04 NOTE — Assessment & Plan Note (Addendum)
Likely dehydration from DKA.  Resolved with IVF. ?--d/c MIVF and encourage oral hydration ?

## 2021-09-04 NOTE — ED Notes (Addendum)
See triage note. Pt has DM type 1, states poor intake over past week and less urine output last few days with swelling in abdomen and face worsening over last 6 days. Hx nephritic or nephrotic syndrome, pt not sure which. ? ?Pt denies hematuria or burning with urination. CVAT + bilateral. ? ?Also pt concerned that may be starting to go into DKA. ?

## 2021-09-04 NOTE — Assessment & Plan Note (Signed)
Controlled, montitor ?

## 2021-09-04 NOTE — Assessment & Plan Note (Addendum)
--  diabetes uncontrolled ?--A1c 13.1 ?--resume home glargine 30u nightly ?--SSI ? ?

## 2021-09-04 NOTE — ED Notes (Signed)
Sent light gree, purople, blue, grey, and dark green tube sto lab. ?

## 2021-09-05 LAB — BASIC METABOLIC PANEL
Anion gap: 6 (ref 5–15)
BUN: 20 mg/dL (ref 6–20)
CO2: 23 mmol/L (ref 22–32)
Calcium: 7.9 mg/dL — ABNORMAL LOW (ref 8.9–10.3)
Chloride: 110 mmol/L (ref 98–111)
Creatinine, Ser: 0.67 mg/dL (ref 0.44–1.00)
GFR, Estimated: >60 mL/min (ref >60)
Glucose, Bld: 79 mg/dL (ref 70–99)
Potassium: 3.4 mmol/L — ABNORMAL LOW (ref 3.5–5.1)
Sodium: 139 mmol/L (ref 135–145)

## 2021-09-05 LAB — GLUCOSE, CAPILLARY
Glucose-Capillary: 124 mg/dL — ABNORMAL HIGH (ref 70–99)
Glucose-Capillary: 145 mg/dL — ABNORMAL HIGH (ref 70–99)
Glucose-Capillary: 49 mg/dL — ABNORMAL LOW (ref 70–99)
Glucose-Capillary: 68 mg/dL — ABNORMAL LOW (ref 70–99)
Glucose-Capillary: 97 mg/dL (ref 70–99)
Glucose-Capillary: 164 mg/dL — ABNORMAL HIGH (ref 70–99)
Glucose-Capillary: 262 mg/dL — ABNORMAL HIGH (ref 70–99)
Glucose-Capillary: 229 mg/dL — ABNORMAL HIGH (ref 70–99)

## 2021-09-05 LAB — HEPATITIS PANEL, ACUTE
HCV Ab: NONREACTIVE
Hep A IgM: NONREACTIVE
Hep B C IgM: NONREACTIVE
Hepatitis B Surface Ag: NONREACTIVE

## 2021-09-05 LAB — PROTEIN / CREATININE RATIO, URINE
Creatinine, Urine: 131 mg/dL
Protein Creatinine Ratio: 7.18 mg/mg{Cre} — ABNORMAL HIGH (ref 0.00–0.15)
Total Protein, Urine: 940 mg/dL

## 2021-09-05 LAB — BETA-HYDROXYBUTYRIC ACID
Beta-Hydroxybutyric Acid: 0.13 mmol/L (ref 0.05–0.27)
Beta-Hydroxybutyric Acid: 0.12 mmol/L (ref 0.05–0.27)

## 2021-09-05 LAB — HEMOGLOBIN A1C
Hgb A1c MFr Bld: 13.1 % — ABNORMAL HIGH (ref 4.8–5.6)
Mean Plasma Glucose: 329 mg/dL

## 2021-09-05 MED ORDER — POTASSIUM CHLORIDE CRYS ER 20 MEQ PO TBCR
40.0000 meq | EXTENDED_RELEASE_TABLET | Freq: Once | ORAL | Status: AC
  Administered 2021-09-05: 40 meq via ORAL
  Filled 2021-09-05: qty 2

## 2021-09-05 MED ORDER — INSULIN GLARGINE-YFGN 100 UNIT/ML ~~LOC~~ SOLN
30.0000 [IU] | Freq: Every day | SUBCUTANEOUS | Status: DC
Start: 2021-09-05 — End: 2021-09-06
  Administered 2021-09-05: 30 [IU] via SUBCUTANEOUS
  Filled 2021-09-05 (×3): qty 0.3

## 2021-09-05 MED ORDER — ENOXAPARIN SODIUM 40 MG/0.4ML IJ SOSY
40.0000 mg | PREFILLED_SYRINGE | INTRAMUSCULAR | Status: DC
Start: 2021-09-05 — End: 2021-09-06
  Filled 2021-09-05: qty 0.4

## 2021-09-05 NOTE — Progress Notes (Signed)
?  Progress Note ? ? ?Patient: Catherine [name]:[id number] DOB: 2001-02-14 DOA: 09/04/2021     1 ?DOS: the patient was seen and examined on 09/05/2021 ?  ?Brief hospital course: ?No notes on file ? ?Assessment and Plan: ?* DKA (diabetic ketoacidosis) (HCC) ?--off insulin gtt, transitioned to subQ insulin ?--pt reported compliance with home insulin ? ?Pseudohyponatremia ?No concerns  ?Sodium corrects to WNL for hyperglycemia ? ?Type 1 diabetes mellitus with hyperglycemia (HCC) ?--diabetes uncontrolled ?--A1c 13.1 ?--resume home glargine 30u nightly ?--SSI ? ? ?HTN (hypertension) ?Controlled, montitor ? ?AKI (acute kidney injury) (HCC) ?Likely dehydration from DKA.  Resolved with IVF. ?--d/c MIVF and encourage oral hydration ? 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 ? ? ? ?  ? ?Subjective:  ?Pt denied N/V.  Having oral intake now.  Complained of generalized swelling. ? ? ?Physical Exam: ?Vitals:  ? 09/05/21 0800 09/05/21 0900 09/05/21 1203 09/05/21 1837  ?BP: (!) 132/97 (!) 129/98 113/79 (!) 139/98  ?Pulse: 89 89 89 92  ?Resp: (!) 24 (!) 22 20 18   ?Temp: 98.7 ?F (37.1 ?C)  97.9 ?F (36.6 ?C) 98.5 ?F (36.9 ?C)  ?TempSrc: Oral  Oral Oral  ?SpO2: 99% 96% 100% 100%  ?Weight:      ?Height:      ? ? ?Constitutional: NAD, AAOx3 ?HEENT: conjunctivae and lids normal, EOMI ?CV: No cyanosis.   ?RESP: normal respiratory effort, on RA ?Neuro: II - XII grossly intact.   ?Psych: subdued mood and affect.   ? ? ?Data Reviewed: ? ?Family Communication:  ? ?Disposition: ?Status is: Inpatient ? ? Planned Discharge Destination: Home ? ? ? ?Time spent: 35 minutes ? ?Author: ? , MD ?09/05/2021 8:03 PM ? ?For on call review [email or web address].  ?

## 2021-09-05 NOTE — Progress Notes (Signed)
[facility] [place], [place] 09/05/21  Subjective:   Hospital day # 1  Patient has been transitioned off of insulin drip and is not on subcu insulin.  She has been able to get up and go to bathroom.  Denies any shortness of breath.  Using oxygen by nasal cannula.  No leg edema. Renal: 03/05 0701 - 03/06 0700 In: 2680.9 [I.V.:1563; IV Piggyback:1117.9] Out: 100 [Urine:100] Lab Results  Component Value Date   CREATININE 0.67 09/05/2021   CREATININE 0.78 09/04/2021   CREATININE 1.10 (H) 09/04/2021     Objective:  Vital signs in last 24 hours:  Temp:  [97.6 F (36.4 C)-98.8 F (37.1 C)] 98.7 F (37.1 C) (03/06 0800) Pulse Rate:  [84-107] 89 (03/06 0800) Resp:  [17-31] 24 (03/06 0800) BP: (94-132)/(62-100) 132/97 (03/06 0800) SpO2:  [89 %-100 %] 99 % (03/06 0800) Weight:  [55.8 kg-59.3 kg] 59.3 kg (03/05 1711)  Weight change:  Filed Weights   09/04/21 1016 09/04/21 1711  Weight: 55.8 kg 59.3 kg    Intake/Output:    Intake/Output Summary (Last 24 hours) at 09/05/2021 0837 Last data filed at 09/05/2021 0500 Gross per 24 hour  Intake 2680.89 ml  Output 100 ml  Net 2580.89 ml    Physical Exam: General:  No acute distress, laying in the bed  HEENT  anicteric, moist oral mucous membrane  Pulm/lungs  normal breathing effort, lungs are clear to auscultation  CVS/Heart  regular rhythm, no rub or gallop  Abdomen:   Soft, mild diffuse tenderness, no guarding  Extremities:  No peripheral edema  Neurologic:  Alert, oriented, able to follow commands  Skin:  No acute rashes      Basic Metabolic Panel:  Recent Labs  Lab 09/04/21 1056 09/04/21 1933 09/05/21 0511  NA 131* 137 139  K 4.6 3.4* 3.4*  CL 98 109 110  CO2 20* 22 23  GLUCOSE 722* 164* 79  BUN 24* 26* 20  CREATININE 1.10* 0.78 0.67  CALCIUM 8.0* 7.9* 7.9*     CBC: Recent Labs  Lab 09/04/21 1056  WBC 8.9  NEUTROABS 7.0  HGB 14.4  HCT 43.6  MCV 86.2  PLT 299      Lab Results   Component Value Date   HEPBSAG NON REACTIVE 09/04/2021   HEPBIGM NON REACTIVE 09/04/2021      Microbiology:  Recent Results (from the past 240 hour(s))  Resp Panel by RT-PCR (Flu A&B, Covid) Nasopharyngeal Swab     Status: None   Collection Time: 09/04/21 12:30 PM   Specimen: Nasopharyngeal Swab; Nasopharyngeal(NP) swabs in vial transport medium  Result Value Ref Range Status   SARS Coronavirus 2 by RT PCR NEGATIVE NEGATIVE Final    Comment: (NOTE) SARS-CoV-2 target nucleic acids are NOT DETECTED.  The SARS-CoV-2 RNA is generally detectable in upper respiratory specimens during the acute phase of infection. The lowest concentration of SARS-CoV-2 viral copies this assay can detect is 138 copies/mL. A negative result does not preclude SARS-Cov-2 infection and should not be used as the sole basis for treatment or other patient management decisions. A negative result may occur with  improper specimen collection/handling, submission of specimen other than nasopharyngeal swab, presence of viral mutation(s) within the areas targeted by this assay, and inadequate number of viral copies(<138 copies/mL). A negative result must be combined with clinical observations, patient history, and epidemiological information. The expected result is Negative.  Fact Sheet for Patients:  BloggerCourse.com  Fact Sheet for Healthcare Providers:  SeriousBroker.it  This test is no t yet approved or cleared by the [place] and  has been authorized for detection and/or diagnosis of SARS-CoV-2 by FDA under an Emergency Use Authorization (EUA). This EUA will remain  in effect (meaning this test can be used) for the duration of the COVID-19 declaration under Section 564(b)(1) of the Act, 21 U.S.C.section 360bbb-3(b)(1), unless the authorization is terminated  or revoked sooner.       Influenza A by PCR NEGATIVE NEGATIVE Final   Influenza B by  PCR NEGATIVE NEGATIVE Final    Comment: (NOTE) The Xpert Xpress SARS-CoV-2/FLU/RSV plus assay is intended as an aid in the diagnosis of influenza from Nasopharyngeal swab specimens and should not be used as a sole basis for treatment. Nasal washings and aspirates are unacceptable for Xpert Xpress SARS-CoV-2/FLU/RSV testing.  Fact Sheet for Patients: BloggerCourse.com  Fact Sheet for Healthcare Providers: SeriousBroker.it  This test is not yet approved or cleared by the [place] FDA and has been authorized for detection and/or diagnosis of SARS-CoV-2 by FDA under an Emergency Use Authorization (EUA). This EUA will remain in effect (meaning this test can be used) for the duration of the COVID-19 declaration under Section 564(b)(1) of the Act, 21 U.S.C. section 360bbb-3(b)(1), unless the authorization is terminated or revoked.  Performed at [facility], [address] [place], [place] [address]   MRSA Next Gen by PCR, Nasal     Status: None   Collection Time: 09/04/21  5:19 PM   Specimen: Nasal Mucosa; Nasal Swab  Result Value Ref Range Status   MRSA by PCR Next Gen NOT DETECTED NOT DETECTED Final    Comment: (NOTE) The GeneXpert MRSA Assay (FDA approved for NASAL specimens only), is one component of a comprehensive MRSA colonization surveillance program. It is not intended to diagnose MRSA infection nor to guide or monitor treatment for MRSA infections. Test performance is not FDA approved in patients less than 14 years old. Performed at [facility], [address] [place], [place] [address]     Coagulation Studies: No results for input(s): LABPROT, INR in the last 72 hours.  Urinalysis: Recent Labs    09/04/21 1230  COLORURINE STRAW*  LABSPEC 1.024  PHURINE 5.0  GLUCOSEU >=500*  HGBUR SMALL*  BILIRUBINUR NEGATIVE  KETONESUR 20*  PROTEINUR 100*  NITRITE NEGATIVE  LEUKOCYTESUR NEGATIVE       Imaging: DG Chest 2 View  Result Date: 09/04/2021 CLINICAL DATA:  21 year old female with history of shortness of breath with exertion. EXAM: CHEST - 2 VIEW COMPARISON:  Chest x-ray 05/17/2021. FINDINGS: Unusual linear opacity in the periphery of the right mid to lower lung, new compared to prior examinations. Left lung is clear. No pleural effusions. No pneumothorax. No evidence of pulmonary edema. Heart size is normal. Upper mediastinal contours are within normal limits. IMPRESSION: 1. Unusual linear opacity in the periphery of the right mid to lower lung, potentially an area of atelectasis or developing scarring. Electronically Signed   By: Trudie Reed M.D.   On: 09/04/2021 11:36   US RENAL  Result Date: 09/04/2021 CLINICAL DATA:  Acute tubular necrosis. EXAM: RENAL / URINARY TRACT ULTRASOUND COMPLETE COMPARISON:  None. FINDINGS: Right Kidney: Renal measurements: 10.4 x 6.2 x 6.1 cm = volume: 205 mL. Echogenicity within normal limits. No mass or hydronephrosis visualized. Left Kidney: Renal measurements: 10.5 x 6.2 x 5.3 cm = volume: 182 mL. Echogenicity within normal limits. No mass or hydronephrosis visualized. Bladder: Appears normal for degree  of bladder distention. Other: None. IMPRESSION: Normal renal ultrasound. Electronically Signed   By: Amie Portland M.D.   On: 09/04/2021 16:30     Medications:    sodium chloride Stopped (09/04/21 1826)   dextrose 5% lactated ringers Stopped (09/05/21 0026)   insulin Stopped (09/05/21 0026)   lactated ringers Stopped (09/04/21 1442)    Chlorhexidine Gluconate Cloth  6 each Topical Q0600   insulin aspart  0-15 Units Subcutaneous TID WC   insulin aspart  0-5 Units Subcutaneous QHS   insulin detemir  18 Units Subcutaneous Q24H   tacrolimus  2 mg Oral BID   acetaminophen, dextrose  Assessment/ Plan:  21 y.o. female with type 1 diabetes on insulin, nephrotic syndrome, history of steroid responsive minimal-change disease followed by [facility]  pediatric nephrology in the past, hypertension  admitted on 09/04/2021 for ATN (acute tubular necrosis) (HCC) [N17.0] DKA (diabetic ketoacidosis) (HCC) [E11.10] Hyperglycemia [R73.9]   Acute kidney injury Likely secondary to volume shifts.  Creatinine peaked at 1.10 and is now back to baseline of 0.6 Continue to maintain hydration and encourage oral fluid intake.  Nephrotic syndrome Urinalysis from March Notes significant glucosuria, proteinuria. We will obtain urine protein to creatinine ratio Differential diagnosis includes diabetic nephropathy versus minimal-change disease recurrence. Outpatient regimen included tacrolimus 2 mg twice a day.  Per outpatient notes, no dose adjustment necessary based on level.  Previous plan was for rituximab infusion as outpatient.     LOS: 1 Catherine [name] 3/6/20238:37 AM  [facility] [place], [place] [phone]  Note: This note was prepared with Dragon dictation. Any transcription errors are unintentional

## 2021-09-05 NOTE — Progress Notes (Addendum)
Inpatient Diabetes Program Recommendations ? ?AACE/ADA: New Consensus Statement on Inpatient Glycemic Control (2015) ? ?Target Ranges:  Prepandial:   less than 140 mg/dL ?     Peak postprandial:   less than 180 mg/dL (1-2 hours) ?     Critically ill patients:  140 - 180 mg/dL  ? ? Latest Reference Range & Units 09/04/21 10:56  ?Sodium 135 - 145 mmol/L 131 (L)  ?Potassium 3.5 - 5.1 mmol/L 4.6  ?Chloride 98 - 111 mmol/L 98  ?CO2 22 - 32 mmol/L 20 (L)  ?Glucose 70 - 99 mg/dL 722 (HH)  ?BUN 6 - 20 mg/dL 24 (H)  ?Creatinine 0.44 - 1.00 mg/dL 1.10 (H)  ?Calcium 8.9 - 10.3 mg/dL 8.0 (L)  ?Anion gap 5 - 15  13  ? ? Latest Reference Range & Units 09/04/21 10:56 09/04/21 19:33 09/05/21 00:07 09/05/21 05:11  ?Beta-Hydroxybutyric Acid 0.05 - 0.27 mmol/L 3.99 (H) 0.15 0.13 0.12  ?(H): Data is abnormally high ? Latest Reference Range & Units 09/04/21 19:22 09/04/21 20:21 09/04/21 21:21 09/04/21 22:23 09/04/21 23:23 09/05/21 00:28 09/05/21 02:08 09/05/21 06:42 09/05/21 07:00  ?Glucose-Capillary 70 - 99 mg/dL 146 (H) 201 (H) 238 (H) 165 (H) ? ?18 units Levemir 155 (H) 145 (H) ? ?IV Insulin Drip Stopped 124 (H) 49 (L) 68 (L)  ? ? ?Admit with: DKA/ Acute Kidney Injury ? ?History: Type 1 Diabetes, Nephrotic Syndrome ? ?Home DM Meds: Lantus 30 units QHS ?      Novolog 5-8 units TID ? ?Current Orders: Novolog Moderate Correction Scale/ SSI (0-15 units) TID AC + HS ?    Levemir 18 units Q24H ? ? ? ?Transitioned off the IV Insulin Drip last PM ? ?Current A1c Pending ?Counseled extensively by the Diabetes RN back in August 2022 during admission for DKA ? ?Endocrinologist: Dr. Gabriel Carina ?Last Seen 03/02/2021 ?Given Rx for Dexcom CGM ? ? ?Addendum 11:40am--Met w/ pt at bedside this AM.  Verified home insulin regimen (see above).  Uses Novolog with a sliding scale and Takes Lantus at bedtime.  Reviewed her Last A1c of 10.5% (Nov 2022) and discussed with pt that we have a current A1c pending.  Reminded patient that her goal A1c is 7% or less per  ADA standards to prevent both acute and long-term complications.  Explained to patient the extreme importance of good glucose control at home.  Encouraged patient to check her CBGs at least TID AC at home and to record all CBGs in a logbook for her PCP and Endocrinologist to review. ?Pt states she takes her Insulin as prescribed--rarely has Hypoglycemia but states her CBGs have been running higher the last week due to her current kidney issues.  Has Meter at home but requesting new CBG meter--will ask MD to prescribe at time of discharge.  Checks CBGs TID at home.  Pt states she has appt with ENDO Dr. Gabriel Carina in April.  Encouraged pt to make sure she goes to next ENDO appt so she can review current A1c and make sure she is taking sufficient insulin at home.  Discussed with pt that we currently are giving her Levemir and Novolog SSI (hospital does not carry Lantus).  Pt appreciative of info provided and did not have any questions for me regarding her diabetes at this time. ? ? ? ?--Will follow patient during hospitalization-- ? ?Wyn Quaker RN, MSN, CDE ?Diabetes Coordinator ?Inpatient Glycemic Control Team ?Team Pager: [phone] (8a-5p) ? ?

## 2021-09-05 NOTE — Progress Notes (Signed)
?  Transition of Care (TOC) Screening Note ? ? ?Patient Details  ?Name: Catherine [name] ?Date of Birth: Jan 23, 2001 ? ? ?Transition of Care (TOC) CM/SW Contact:    ?Allayne Butcher, RN ?Phone Number: ?09/05/2021, 11:47 AM ? ? ? ?Transition of Care Department [facility]) has reviewed patient and no TOC needs have been identified at this time. We will continue to monitor patient advancement through interdisciplinary progression rounds. If new patient transition needs arise, please place a TOC consult. ?  ?

## 2021-09-06 LAB — CBC
HCT: 37.5 % (ref 36.0–46.0)
Hemoglobin: 12.5 g/dL (ref 12.0–15.0)
MCH: 28.3 pg (ref 26.0–34.0)
MCHC: 33.3 g/dL (ref 30.0–36.0)
MCV: 85 fL (ref 80.0–100.0)
Platelets: 292 10*3/uL (ref 150–400)
RBC: 4.41 MIL/uL (ref 3.87–5.11)
RDW: 13.3 % (ref 11.5–15.5)
WBC: 6.7 10*3/uL (ref 4.0–10.5)
nRBC: 0 % (ref 0.0–0.2)

## 2021-09-06 LAB — BASIC METABOLIC PANEL
Anion gap: 6 (ref 5–15)
BUN: 17 mg/dL (ref 6–20)
CO2: 23 mmol/L (ref 22–32)
Calcium: 7.9 mg/dL — ABNORMAL LOW (ref 8.9–10.3)
Chloride: 107 mmol/L (ref 98–111)
Creatinine, Ser: 0.64 mg/dL (ref 0.44–1.00)
GFR, Estimated: >60 mL/min (ref >60)
Glucose, Bld: 267 mg/dL — ABNORMAL HIGH (ref 70–99)
Potassium: 4.1 mmol/L (ref 3.5–5.1)
Sodium: 136 mmol/L (ref 135–145)

## 2021-09-06 LAB — LIPID PANEL
Cholesterol: 382 mg/dL — ABNORMAL HIGH (ref 0–200)
HDL: 41 mg/dL (ref >40)
LDL Cholesterol: 268 mg/dL — ABNORMAL HIGH (ref 0–99)
Total CHOL/HDL Ratio: 9.3 RATIO
Triglycerides: 365 mg/dL — ABNORMAL HIGH (ref <150)
VLDL: 73 mg/dL — ABNORMAL HIGH (ref 0–40)

## 2021-09-06 LAB — ANCA TITERS
Atypical P-ANCA titer: <1:20 {titer}
C-ANCA: <1:20 {titer}
P-ANCA: <1:20 {titer}

## 2021-09-06 LAB — C3 COMPLEMENT: C3 Complement: 173 mg/dL — ABNORMAL HIGH (ref 82–167)

## 2021-09-06 LAB — MAGNESIUM: Magnesium: 1.8 mg/dL (ref 1.7–2.4)

## 2021-09-06 LAB — GLUCOSE, CAPILLARY
Glucose-Capillary: 316 mg/dL — ABNORMAL HIGH (ref 70–99)
Glucose-Capillary: 52 mg/dL — ABNORMAL LOW (ref 70–99)

## 2021-09-06 LAB — ANTI-DNA ANTIBODY, DOUBLE-STRANDED: ds DNA Ab: <1 IU/mL (ref 0–9)

## 2021-09-06 LAB — C4 COMPLEMENT: Complement C4, Body Fluid: 38 mg/dL (ref 12–38)

## 2021-09-06 MED ORDER — ATORVASTATIN CALCIUM 40 MG PO TABS
40.0000 mg | ORAL_TABLET | Freq: Every day | ORAL | 0 refills | Status: DC
Start: 2021-09-06 — End: 2022-09-05

## 2021-09-06 MED ORDER — FUROSEMIDE 10 MG/ML IJ SOLN
10.0000 mg | Freq: Once | INTRAMUSCULAR | Status: AC
  Administered 2021-09-06: 10 mg via INTRAVENOUS
  Filled 2021-09-06: qty 2

## 2021-09-06 MED ORDER — LANTUS 100 UNIT/ML ~~LOC~~ SOLN: 30.0000 [IU] | Freq: Every day | SUBCUTANEOUS | 2 refills | Status: DC

## 2021-09-06 MED ORDER — FUROSEMIDE 10 MG/ML IJ SOLN
40.0000 mg | Freq: Once | INTRAMUSCULAR | Status: AC
  Administered 2021-09-06: 40 mg via INTRAVENOUS
  Filled 2021-09-06: qty 4

## 2021-09-06 MED ORDER — BLOOD GLUCOSE METER KIT: PACK | 0 refills | Status: AC

## 2021-09-06 MED ORDER — INSULIN ASPART 100 UNIT/ML ~~LOC~~ SOLN: 5.0000 [IU] | Freq: Three times a day (TID) | SUBCUTANEOUS | 2 refills | Status: DC

## 2021-09-06 MED ORDER — FUROSEMIDE 20 MG PO TABS: 20.0000 mg | ORAL_TABLET | Freq: Every day | ORAL | 0 refills | Status: DC | PRN

## 2021-09-06 NOTE — Discharge Summary (Signed)
Physician Discharge Summary   Catherine [name]  female DOB: 2000/12/19  XMD:[id number]  PCP: [facility] [place] date: 09/04/2021 Discharge date: 09/06/2021  Admitted From: home Disposition:  home CODE STATUS: Full code  Discharge Instructions     Discharge instructions   Complete by: As directed    You were treated for DKA and your A1c is high at 13.1.  Please be sure to take your Lantus every day, and at least 5 units of meal-time short-acting insulin with each meal.  Please follow up with your outpatient endocrin Dr. Gabriel Carina.    Your bad cholesterol LDL is high, so please resume taking Lipitor.   Dr. Enzo Bi [facility] Course:  For full details, please see H&P, progress notes, consult notes and ancillary notes.  Briefly,  Catherine [name] is a 21 y.o. female with a history of type 1 diabetes on insulin, nephrotic syndrome, and hypertension who presented to ED 09/04/21 with swelling over the last several days mainly in her abdomen similar to prior flares of her nephrotic syndrome.  In addition: nausea and vomiting, decreased appetite, and back pain.   Workup in the ED found pt to be in DKA.  * DKA (diabetic ketoacidosis) (HCC) 2/2 Insulin non-compliance Type 1 diabetes mellitus, poorly controlled with hyperglycemia (HCC) --off insulin gtt, transitioned to subQ insulin. --pt initially reported compliance with home insulin, however, later admitted to not taking her insulin regularly as prescribed, as evidenced by her severely elevated A1c 13.1. --cont home Lantus 30u nightly and advised pt to take at least 5u meal-time insulin with each meal.  Both insulin refilled at discharge, as well as ordered BG meter kit and supplies. --follow up with outpatient endocrin Dr. Gabriel Carina.     Pseudohyponatremia Sodium corrects to WNL for hyperglycemia   HTN (hypertension) Controlled   AKI (acute kidney injury) ([place]) --Cr 1.1 on presentation.   Likely dehydration from DKA.  Resolved with IVF.  Cr 0.64 prior to discharge.   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 Pt reports compliance w/ home tacrolimus  Consulted to nephrology. --pt will continue outpatient [facility] f/u at [facility]  Swelling generalized --Pt complained of swelling, and reported taking Lasix (not torsemide) at home PRN for swelling.  No hypoxia or respiratory distress.  Pt was given IV lasix 40 mg x1 prior to discharge per request, and discharged on Lasix 20 mg daily PRN.    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 The score is based on dignosis, age, lab data, medications, orders, and past utilization.   Low:  0-14.9   Medium: 15-21.9   High: 22-29.9   Extreme: 30 and above         Discharge Instructions:  Allergies as of 09/06/2021  No Known Allergies      Medication List     STOP taking these medications    torsemide 20 MG tablet Commonly known as: DEMADEX       TAKE these medications    acetaminophen 325 MG tablet Commonly known as: TYLENOL Take 325 mg by mouth every 6 (six) hours as needed.   atorvastatin 40 MG tablet Commonly known as: LIPITOR Take 1 tablet (40 mg total) by mouth daily. What changed:  when to take this reasons to take  this   blood glucose meter kit and supplies Dispense based on patient and insurance preference. Use up to four times daily as directed. (FOR ICD-10 E10.9, E11.9).   furosemide 20 MG tablet Commonly known as: LASIX Take 1 tablet (20 mg total) by mouth daily as needed.   glucagon 1 MG injection Inject 1 mg into the muscle as needed.   insulin aspart 100 UNIT/ML injection Commonly known as: novoLOG Inject 5-8 Units into the skin 3 (three) times daily before meals.   Lantus 100 UNIT/ML injection Generic drug: insulin glargine Inject 0.3 mLs (30 Units total) into the skin at bedtime.   tacrolimus 1 MG capsule Commonly known as: PROGRAF Take 2 mg by mouth 2 (two) times daily.         Follow-up Information     Solum, Betsey Holiday, MD Follow up.   Specialty: Endocrinology Why: follow up with your scheduled appointment in April. Contact information: [place] [address] [place], [place] Follow up in 1 week(s).   Contact information: [place] [address] [phone]                 No Known Allergies   The results of significant diagnostics from this hospitalization (including imaging, microbiology, ancillary and laboratory) are listed below for reference.   Consultations:   Procedures/Studies: DG Chest 2 View  Result Date: 09/04/2021 CLINICAL DATA:  21 year old female with history of shortness of breath with exertion. EXAM: CHEST - 2 VIEW COMPARISON:  Chest x-ray 05/17/2021. FINDINGS: Unusual linear opacity in the periphery of the right mid to lower lung, new compared to prior examinations. Left lung is clear. No pleural effusions. No pneumothorax. No evidence of pulmonary edema. Heart size is normal. Upper mediastinal contours are within normal limits. IMPRESSION: 1. Unusual linear opacity in the periphery of the right mid to lower lung, potentially an area of  atelectasis or developing scarring. Electronically Signed   By: Vinnie Langton M.D.   On: 09/04/2021 11:36   US RENAL  Result Date: 09/04/2021 CLINICAL DATA:  Acute tubular necrosis. EXAM: RENAL / URINARY TRACT ULTRASOUND COMPLETE COMPARISON:  None. FINDINGS: Right Kidney: Renal measurements: 10.4 x 6.2 x 6.1 cm = volume: 205 mL. Echogenicity within normal limits. No mass or hydronephrosis visualized. Left Kidney: Renal measurements: 10.5 x 6.2 x 5.3 cm = volume: 182 mL. Echogenicity within normal limits. No mass or hydronephrosis visualized. Bladder: Appears normal for degree of bladder distention. Other: None. IMPRESSION: Normal renal ultrasound. Electronically Signed   By: Lajean Manes M.D.   On: 09/04/2021 16:30      Labs: BNP (last 3 results) No results for input(s): BNP in the last 8760 hours. Basic Metabolic Panel: Recent Labs  Lab 09/04/21 1056 09/04/21 1933 09/05/21 0511 09/06/21 0500  NA 131* 137 139 136  K 4.6 3.4* 3.4* 4.1  CL 98 109 110  107  CO2 20* _0 GLUCOSE 722* 164* 79 267*  BUN 24* 26* 20 17  CREATININE 1.10* 0.78 0.67 0.64  CALCIUM 8.0* 7.9* 7.9* 7.9*  MG  --   --   --  1.8   Liver Function Tests: Recent Labs  Lab 09/04/21 1056  AST 21  ALT 16  ALKPHOS 111  BILITOT 0.6  PROT 5.7*  ALBUMIN <1.5*   No results for input(s): LIPASE, AMYLASE in the last 168 hours. No results for input(s): AMMONIA in the last 168 hours. CBC: Recent Labs  Lab 09/04/21 1056 09/06/21 0500  WBC 8.9 6.7  NEUTROABS 7.0  --   HGB 14.4 12.5  HCT 43.6 37.5  MCV 86.2 85.0  PLT 299 292   Cardiac Enzymes: No results for input(s): CKTOTAL, CKMB, CKMBINDEX, TROPONINI in the last 168 hours. BNP: Invalid input(s): POCBNP CBG: Recent Labs  Lab 09/05/21 1117 09/05/21 1707 09/05/21 2054 09/06/21 0044 09/06/21 0752  GLUCAP 164* 262* 229* 52* 316*   D-Dimer No results for input(s): DDIMER in the last 72 hours. Hgb A1c Recent Labs    09/04/21 1306  HGBA1C 13.1*    Lipid Profile Recent Labs    09/06/21 0500  CHOL 382*  HDL 41  LDLCALC 268*  TRIG 365*  CHOLHDL 9.3   Thyroid function studies No results for input(s): TSH, T4TOTAL, T3FREE, THYROIDAB in the last 72 hours.  Invalid input(s): FREET3 Anemia work up No results for input(s): VITAMINB12, FOLATE, FERRITIN, TIBC, IRON, RETICCTPCT in the last 72 hours. Urinalysis    Component Value Date/Time   COLORURINE STRAW (A) 09/04/2021 1230   APPEARANCEUR HAZY (A) 09/04/2021 1230   LABSPEC 1.024 09/04/2021 1230   PHURINE 5.0 09/04/2021 1230   GLUCOSEU >=500 (A) 09/04/2021 1230   HGBUR SMALL (A) 09/04/2021 1230   BILIRUBINUR NEGATIVE 09/04/2021 1230   KETONESUR 20 (A) 09/04/2021 1230   PROTEINUR 100 (A) 09/04/2021 1230   NITRITE NEGATIVE 09/04/2021 1230   LEUKOCYTESUR NEGATIVE 09/04/2021 1230   Sepsis Labs Invalid input(s): PROCALCITONIN,  WBC,  LACTICIDVEN Microbiology Recent Results (from the past 240 hour(s))  Resp Panel by RT-PCR (Flu A&B, Covid) Nasopharyngeal Swab     Status: None   Collection Time: 09/04/21 12:30 PM   Specimen: Nasopharyngeal Swab; Nasopharyngeal(NP) swabs in vial transport medium  Result Value Ref Range Status   SARS Coronavirus 2 by RT PCR NEGATIVE NEGATIVE Final    Comment: (NOTE) SARS-CoV-2 target nucleic acids are NOT DETECTED.  The SARS-CoV-2 RNA is generally detectable in upper respiratory specimens during the acute phase of infection. The lowest concentration of SARS-CoV-2 viral copies this assay can detect is 138 copies/mL. A negative result does not preclude SARS-Cov-2 infection and should not be used as the sole basis for treatment or other patient management decisions. A negative result may occur with  improper specimen collection/handling, submission of specimen other than nasopharyngeal swab, presence of viral mutation(s) within the areas targeted by this assay, and inadequate number of viral copies(<138 copies/mL). A negative result must be  combined with clinical observations, patient history, and epidemiological information. The expected result is Negative.  Fact Sheet for Patients:  EntrepreneurPulse.com.au  Fact Sheet for Healthcare Providers:  IncredibleEmployment.be  This test is no t yet approved or cleared by the [place] FDA and  has been authorized for detection and/or diagnosis of SARS-CoV-2 by FDA under an Emergency Use Authorization (EUA). This EUA will remain  in effect (meaning this test can be used) for the  duration of the COVID-19 declaration under Section 564(b)(1) of the Act, 21 U.S.C.section 360bbb-3(b)(1), unless the authorization is terminated  or revoked sooner.       Influenza A by PCR NEGATIVE NEGATIVE Final   Influenza B by PCR NEGATIVE NEGATIVE Final    Comment: (NOTE) The Xpert Xpress SARS-CoV-2/FLU/RSV plus assay is intended as an aid in the diagnosis of influenza from Nasopharyngeal swab specimens and should not be used as a sole basis for treatment. Nasal washings and aspirates are unacceptable for Xpert Xpress SARS-CoV-2/FLU/RSV testing.  Fact Sheet for Patients: EntrepreneurPulse.com.au  Fact Sheet for Healthcare Providers: IncredibleEmployment.be  This test is not yet approved or cleared by the [place] FDA and has been authorized for detection and/or diagnosis of SARS-CoV-2 by FDA under an Emergency Use Authorization (EUA). This EUA will remain in effect (meaning this test can be used) for the duration of the COVID-19 declaration under Section 564(b)(1) of the Act, 21 U.S.C. section 360bbb-3(b)(1), unless the authorization is terminated or revoked.  Performed at [facility], [address] [place], [place] [address]   MRSA Next Gen by PCR, Nasal     Status: None   Collection Time: 09/04/21  5:19 PM   Specimen: Nasal Mucosa; Nasal Swab  Result Value Ref Range Status   MRSA by PCR  Next Gen NOT DETECTED NOT DETECTED Final    Comment: (NOTE) The GeneXpert MRSA Assay (FDA approved for NASAL specimens only), is one component of a comprehensive MRSA colonization surveillance program. It is not intended to diagnose MRSA infection nor to guide or monitor treatment for MRSA infections. Test performance is not FDA approved in patients less than 12 years old. Performed at [facility], [address] [place], [place] [address]      Total time spend on discharging this patient, including the last patient exam, discussing the hospital stay, instructions for ongoing care as it relates to all pertinent caregivers, as well as preparing the medical discharge records, prescriptions, and/or referrals as applicable, is 40 minutes.    Enzo Bi, MD  [facility] 09/06/2021, 8:14 AM

## 2021-09-06 NOTE — Progress Notes (Signed)
Pt voided in bathroom and urine wasn't saved for 24 hour urine collection. I called lab and they said if the collection wasn't complete it was not a true 24 hr collection and wouldn't be accepted. New hat supplied to patient. Pt c/o feeling full of fluids all over and requested lasix. IV lasix given. Waiting to void so 24 hr collection can be restarted. On call MD informed of missed collection. ?

## 2021-09-06 NOTE — Care Management (Signed)
Floor coverage  ?Pt c/o fluid retention and bloat. Requesting fluid pill. Given Lasix 10 mg IV x1 dose. ?

## 2021-09-06 NOTE — Progress Notes (Signed)
Patient was discharged to home. AVS was reviewed, she is aware of f/u appointment with her PCP. Unable to schedule an appointment with nephrology at [facility], but they will call the patient to set up an appointment. Volunteers assisted the patient to her car, she provided her own transportation.  ? ?IV lasix given and patient reported her breathing had improved. ?

## 2021-09-07 LAB — QUANTIFERON-TB GOLD PLUS (RQFGPL)
QuantiFERON Mitogen Value: 0.84 IU/mL
QuantiFERON Nil Value: 0.01 IU/mL
QuantiFERON TB1 Ag Value: 0.01 IU/mL
QuantiFERON TB2 Ag Value: 0.01 IU/mL

## 2021-09-07 LAB — QUANTIFERON-TB GOLD PLUS: QuantiFERON-TB Gold Plus: NEGATIVE

## 2021-09-07 LAB — COMPLEMENT, TOTAL: Compl, Total (CH50): >60 U/mL (ref >41)

## 2021-09-09 LAB — ANTINUCLEAR ANTIBODIES, IFA: ANA Ab, IFA: NEGATIVE

## 2021-09-19 ENCOUNTER — Emergency Department
Admission: EM | Admit: 2021-09-19 | Discharge: 2021-09-19 | Disposition: A | Discharge status: Home / Self Care | Payer: Medicaid Other | Attending: Emergency Medicine | Admitting: Emergency Medicine

## 2021-09-19 ENCOUNTER — Emergency Department: Payer: Medicaid Other

## 2021-09-19 ENCOUNTER — Other Ambulatory Visit: Payer: Self-pay

## 2021-09-19 DIAGNOSIS — N049 Nephrotic syndrome with unspecified morphologic changes: Secondary | ICD-10-CM | POA: Diagnosis not present

## 2021-09-19 DIAGNOSIS — R220 Localized swelling, mass and lump, head: Secondary | ICD-10-CM | POA: Diagnosis present

## 2021-09-19 DIAGNOSIS — R739 Hyperglycemia, unspecified: Secondary | ICD-10-CM

## 2021-09-19 DIAGNOSIS — E1065 Type 1 diabetes mellitus with hyperglycemia: Secondary | ICD-10-CM | POA: Insufficient documentation

## 2021-09-19 DIAGNOSIS — I10 Essential (primary) hypertension: Secondary | ICD-10-CM | POA: Diagnosis not present

## 2021-09-19 LAB — CBC WITH DIFFERENTIAL/PLATELET
Abs Immature Granulocytes: 0.03 10*3/uL (ref 0.00–0.07)
Basophils Absolute: 0.1 10*3/uL (ref 0.0–0.1)
Basophils Relative: 1 %
Eosinophils Absolute: 0.2 10*3/uL (ref 0.0–0.5)
Eosinophils Relative: 3 %
HCT: 42 % (ref 36.0–46.0)
Hemoglobin: 13.7 g/dL (ref 12.0–15.0)
Immature Granulocytes: 0 %
Lymphocytes Relative: 25 %
Lymphs Abs: 2.1 10*3/uL (ref 0.7–4.0)
MCH: 28.2 pg (ref 26.0–34.0)
MCHC: 32.6 g/dL (ref 30.0–36.0)
MCV: 86.4 fL (ref 80.0–100.0)
Monocytes Absolute: 0.4 10*3/uL (ref 0.1–1.0)
Monocytes Relative: 5 %
Neutro Abs: 5.5 10*3/uL (ref 1.7–7.7)
Neutrophils Relative %: 66 %
Platelets: 284 10*3/uL (ref 150–400)
RBC: 4.86 MIL/uL (ref 3.87–5.11)
RDW: 13.7 % (ref 11.5–15.5)
WBC: 8.2 10*3/uL (ref 4.0–10.5)
nRBC: 0 % (ref 0.0–0.2)

## 2021-09-19 LAB — COMPREHENSIVE METABOLIC PANEL
ALT: 10 U/L (ref 0–44)
AST: 15 U/L (ref 15–41)
Albumin: <1.5 g/dL — ABNORMAL LOW (ref 3.5–5.0)
Alkaline Phosphatase: 103 U/L (ref 38–126)
Anion gap: 10 (ref 5–15)
BUN: 24 mg/dL — ABNORMAL HIGH (ref 6–20)
CO2: 24 mmol/L (ref 22–32)
Calcium: 7.8 mg/dL — ABNORMAL LOW (ref 8.9–10.3)
Chloride: 102 mmol/L (ref 98–111)
Creatinine, Ser: 1.07 mg/dL — ABNORMAL HIGH (ref 0.44–1.00)
GFR, Estimated: >60 mL/min (ref >60)
Glucose, Bld: 391 mg/dL — ABNORMAL HIGH (ref 70–99)
Potassium: 4.1 mmol/L (ref 3.5–5.1)
Sodium: 136 mmol/L (ref 135–145)
Total Bilirubin: 0.4 mg/dL (ref 0.3–1.2)
Total Protein: 5.1 g/dL — ABNORMAL LOW (ref 6.5–8.1)

## 2021-09-19 LAB — POC URINE PREG, ED: Preg Test, Ur: NEGATIVE

## 2021-09-19 LAB — URINALYSIS, ROUTINE W REFLEX MICROSCOPIC
Bilirubin Urine: NEGATIVE
Glucose, UA: >=500 mg/dL — AB
Ketones, ur: 5 mg/dL — AB
Leukocytes,Ua: NEGATIVE
Nitrite: NEGATIVE
Protein, ur: >=300 mg/dL — AB
Specific Gravity, Urine: 1.024 (ref 1.005–1.030)
pH: 6 (ref 5.0–8.0)

## 2021-09-19 MED ORDER — FUROSEMIDE 20 MG PO TABS: 20.0000 mg | ORAL_TABLET | Freq: Every day | ORAL | 0 refills | Status: DC

## 2021-09-19 MED ORDER — FUROSEMIDE 10 MG/ML IJ SOLN
40.0000 mg | Freq: Once | INTRAMUSCULAR | Status: AC
Start: 2021-09-19 — End: 2021-09-19
  Administered 2021-09-19: 40 mg via INTRAVENOUS
  Filled 2021-09-19: qty 4

## 2021-09-19 MED ORDER — FUROSEMIDE 10 MG/ML IJ SOLN
40.0000 mg | Freq: Once | INTRAMUSCULAR | Status: AC
  Administered 2021-09-19: 40 mg via INTRAVENOUS
  Filled 2021-09-19: qty 4

## 2021-09-19 MED ORDER — INSULIN ASPART 100 UNIT/ML IJ SOLN
8.0000 [IU] | Freq: Once | INTRAMUSCULAR | Status: AC
  Administered 2021-09-19: 8 [IU] via INTRAVENOUS
  Filled 2021-09-19: qty 1

## 2021-09-19 NOTE — ED Provider Notes (Signed)
? ?[facility] ?Provider Note ? ? ? Event Date/Time  ? First MD Initiated Contact with Patient 09/19/21 1156   ?  (approximate) ? ? ?History  ? ?Chief Complaint ?Facial Swelling ? ? ?HPI ? ?TITIANA [name] is a 21 y.o. female with past medical history of hypertension, diabetes, and nephrotic syndrome who presents to the ED complaining of facial swelling.  Patient reports that she was recently admitted to the hospital for DKA and exacerbation of her nephrotic syndrome, was started on Lasix at that time.  She states she has almost completed the course of Lasix but has had worsening in her swelling despite this.  She primarily notices the swelling around her legs, abdomen, and face.  She reports being compliant with her insulin regimen since discharge from the hospital, denies any nausea, vomiting, dysuria, or abdominal pain.  She states she has a follow-up appointment with nephrology in 10 days. ?  ? ? ?Physical Exam  ? ?Triage Vital Signs: ?ED Triage Vitals [09/19/21 1135]  ?Enc Vitals Group  ?   BP 124/89  ?   Pulse Rate 95  ?   Resp (!) 24  ?   Temp 98 ?F (36.7 ?C)  ?   Temp Source Oral  ?   SpO2 97 %  ?   Weight   ?   Height 4\' 11"  (1.499 m)  ?   Head Circumference   ?   Peak Flow   ?   Pain Score 0  ?   Pain Loc   ?   Pain Edu?   ?   Excl. in GC?   ? ? ?Most recent vital signs: ?Vitals:  ? 09/19/21 1135 09/19/21 1430  ?BP: 124/89 (!) 128/95  ?Pulse: 95 96  ?Resp: (!) 24 20  ?Temp: 98 ?F (36.7 ?C)   ?SpO2: 97% 97%  ? ? ?Constitutional: Alert and oriented. ?Eyes: Conjunctivae are normal.  Periorbital edema noted. ?Head: Atraumatic. ?Nose: No congestion/rhinnorhea. ?Mouth/Throat: Mucous membranes are moist.  ?Cardiovascular: Normal rate, regular rhythm. Grossly normal heart sounds.  2+ radial pulses bilaterally. ?Respiratory: Normal respiratory effort.  No retractions. Lungs CTAB. ?Gastrointestinal: Soft and nontender. No distention.  Abdominal wall edema noted. ?Musculoskeletal: No lower  extremity tenderness nor edema.  Pitting edema to mid shins bilaterally. ?Neurologic:  Normal speech and language. No gross focal neurologic deficits are appreciated. ? ? ? ?ED Results / Procedures / Treatments  ? ?Labs ?(all labs ordered are listed, but only abnormal results are displayed) ?Labs Reviewed  ?COMPREHENSIVE METABOLIC PANEL - Abnormal; Notable for the following components:  ?    Result Value  ? Glucose, Bld 391 (*)   ? BUN 24 (*)   ? Creatinine, Ser 1.07 (*)   ? Calcium 7.8 (*)   ? Total Protein 5.1 (*)   ? Albumin <1.5 (*)   ? All other components within normal limits  ?URINALYSIS, ROUTINE W REFLEX MICROSCOPIC - Abnormal; Notable for the following components:  ? Color, Urine YELLOW (*)   ? APPearance CLOUDY (*)   ? Glucose, UA >=500 (*)   ? Hgb urine dipstick SMALL (*)   ? Ketones, ur 5 (*)   ? Protein, ur >=300 (*)   ? Bacteria, UA RARE (*)   ? All other components within normal limits  ?CBC WITH DIFFERENTIAL/PLATELET  ?POC URINE PREG, ED  ? ? ? ?EKG ? ?ED ECG REPORT ?09/21/21, the attending physician, personally viewed and interpreted this ECG. ? ?  Date: 09/19/2021 ? EKG Time: 11:44 ? Rate: 92 ? Rhythm: normal sinus rhythm ? Axis: RAD ? Intervals:none ? ST&T Change: None ? ?RADIOLOGY ?Chest x-ray reviewed by me with no infiltrate, edema, or effusion. ? ?PROCEDURES: ? ?Critical Care performed: No ? ?Procedures ? ? ?MEDICATIONS ORDERED IN ED: ?Medications  ?furosemide (LASIX) injection 40 mg (has no administration in time range)  ?insulin aspart (novoLOG) injection 8 Units (8 Units Intravenous Given 09/19/21 1420)  ?furosemide (LASIX) injection 40 mg (40 mg Intravenous Given 09/19/21 1414)  ? ? ? ?IMPRESSION / MDM / ASSESSMENT AND PLAN / ED COURSE  ?I reviewed the triage vital signs and the nursing notes. ?             ?               ? ?20 y.o. female with past medical history of hypertension, type 1 diabetes, and nephrotic syndrome who presents to the ED complaining of increased swelling  around her face, abdomen, and legs over about the past week. ? ?Differential diagnosis includes, but is not limited to, nephrotic syndrome, electrolyte abnormality, hyperglycemia, DKA, CHF. ? ?Patient is nontoxic-appearing and in no acute distress, had reported some mild difficulty breathing but is maintaining O2 sats on room air with no respiratory difficulties at this time.  Chest x-ray shows no signs of pulmonary edema or other abnormality.  Labs remarkable for very low protein levels with albumin of less than 1.5 along with significant protein in her urine.  Patient also noted to be hyperglycemic but no increase in anion gap or acidosis to suggest DKA.  We will give IV dose of insulin and hold off on IV fluid bolus.  Case discussed with Dr. Thedore Mins of nephrology, who recommends IV Lasix here in the ED along with outpatient neurology follow-up.  He recommends against steroids given concern this could incite DKA. ? ?Patient appropriately diuresed following dose of IV Lasix, we will give additional prescription for 20 mg Lasix daily as an outpatient until her scheduled follow-up with nephrology in 10 days.  Patient and mother counseled to return to the ED for new worsening symptoms, patient agrees with plan. ? ?  ? ? ?FINAL CLINICAL IMPRESSION(S) / ED DIAGNOSES  ? ?Final diagnoses:  ?Nephrotic syndrome  ?Facial swelling  ?Hyperglycemia  ? ? ? ?Rx / DC Orders  ? ?ED Discharge Orders   ? ?      Ordered  ?  furosemide (LASIX) 20 MG tablet  Daily       ? 09/19/21 1508  ? ?  ?  ? ?  ? ? ? ?Note:  This document was prepared using Dragon voice recognition software and may include unintentional dictation errors. ?  ?Chesley Noon, MD ?09/19/21 1513 ? ?

## 2021-09-19 NOTE — ED Triage Notes (Signed)
Patient to ER via POV with complaints of face/ eye swelling and some shortness of breath. Patient reports she was previously seen and given diuretics, has been taking these as prescribed but reports increase in swelling.  ? ?Reports hx of nephrotic syndrome.  ?

## 2021-09-19 NOTE — ED Notes (Signed)
Pt taken to xray 

## 2021-09-26 ENCOUNTER — Inpatient Hospital Stay: Payer: Medicaid Other

## 2021-09-26 ENCOUNTER — Other Ambulatory Visit: Payer: Self-pay

## 2021-09-26 ENCOUNTER — Inpatient Hospital Stay
Admission: EM | Admit: 2021-09-26 | Discharge: 2021-09-30 | DRG: 871 | Disposition: A | Discharge status: Home / Self Care | Payer: Medicaid Other | Attending: Student | Admitting: Student

## 2021-09-26 ENCOUNTER — Encounter: Payer: Self-pay | Admitting: Internal Medicine

## 2021-09-26 ENCOUNTER — Emergency Department: Payer: Medicaid Other

## 2021-09-26 DIAGNOSIS — N040 Nephrotic syndrome with minor glomerular abnormality: Secondary | ICD-10-CM | POA: Diagnosis present

## 2021-09-26 DIAGNOSIS — A401 Sepsis due to streptococcus, group B: Secondary | ICD-10-CM | POA: Diagnosis present

## 2021-09-26 DIAGNOSIS — R140 Abdominal distension (gaseous): Secondary | ICD-10-CM | POA: Diagnosis present

## 2021-09-26 DIAGNOSIS — J9811 Atelectasis: Secondary | ICD-10-CM | POA: Diagnosis present

## 2021-09-26 DIAGNOSIS — Z8249 Family history of ischemic heart disease and other diseases of the circulatory system: Secondary | ICD-10-CM | POA: Diagnosis not present

## 2021-09-26 DIAGNOSIS — R1084 Generalized abdominal pain: Secondary | ICD-10-CM | POA: Diagnosis not present

## 2021-09-26 DIAGNOSIS — E1021 Type 1 diabetes mellitus with diabetic nephropathy: Secondary | ICD-10-CM | POA: Diagnosis present

## 2021-09-26 DIAGNOSIS — R109 Unspecified abdominal pain: Secondary | ICD-10-CM | POA: Diagnosis present

## 2021-09-26 DIAGNOSIS — D849 Immunodeficiency, unspecified: Secondary | ICD-10-CM | POA: Diagnosis present

## 2021-09-26 DIAGNOSIS — R6521 Severe sepsis with septic shock: Secondary | ICD-10-CM | POA: Diagnosis present

## 2021-09-26 DIAGNOSIS — R197 Diarrhea, unspecified: Secondary | ICD-10-CM | POA: Diagnosis not present

## 2021-09-26 DIAGNOSIS — Z833 Family history of diabetes mellitus: Secondary | ICD-10-CM | POA: Diagnosis not present

## 2021-09-26 DIAGNOSIS — R6510 Systemic inflammatory response syndrome (SIRS) of non-infectious origin without acute organ dysfunction: Secondary | ICD-10-CM | POA: Diagnosis not present

## 2021-09-26 DIAGNOSIS — E876 Hypokalemia: Secondary | ICD-10-CM | POA: Diagnosis present

## 2021-09-26 DIAGNOSIS — R7881 Bacteremia: Secondary | ICD-10-CM | POA: Diagnosis not present

## 2021-09-26 DIAGNOSIS — E872 Acidosis, unspecified: Secondary | ICD-10-CM | POA: Diagnosis present

## 2021-09-26 DIAGNOSIS — Z8349 Family history of other endocrine, nutritional and metabolic diseases: Secondary | ICD-10-CM

## 2021-09-26 DIAGNOSIS — E1069 Type 1 diabetes mellitus with other specified complication: Secondary | ICD-10-CM | POA: Diagnosis present

## 2021-09-26 DIAGNOSIS — R6520 Severe sepsis without septic shock: Secondary | ICD-10-CM | POA: Diagnosis not present

## 2021-09-26 DIAGNOSIS — E1029 Type 1 diabetes mellitus with other diabetic kidney complication: Secondary | ICD-10-CM | POA: Diagnosis present

## 2021-09-26 DIAGNOSIS — E8770 Fluid overload, unspecified: Secondary | ICD-10-CM | POA: Diagnosis present

## 2021-09-26 DIAGNOSIS — E860 Dehydration: Secondary | ICD-10-CM | POA: Diagnosis present

## 2021-09-26 DIAGNOSIS — N049 Nephrotic syndrome with unspecified morphologic changes: Secondary | ICD-10-CM | POA: Diagnosis not present

## 2021-09-26 DIAGNOSIS — I10 Essential (primary) hypertension: Secondary | ICD-10-CM | POA: Diagnosis present

## 2021-09-26 DIAGNOSIS — Z79899 Other long term (current) drug therapy: Secondary | ICD-10-CM

## 2021-09-26 DIAGNOSIS — R8271 Bacteriuria: Secondary | ICD-10-CM | POA: Diagnosis not present

## 2021-09-26 DIAGNOSIS — Z794 Long term (current) use of insulin: Secondary | ICD-10-CM | POA: Diagnosis not present

## 2021-09-26 DIAGNOSIS — R112 Nausea with vomiting, unspecified: Secondary | ICD-10-CM | POA: Diagnosis present

## 2021-09-26 DIAGNOSIS — J189 Pneumonia, unspecified organism: Secondary | ICD-10-CM

## 2021-09-26 DIAGNOSIS — B951 Streptococcus, group B, as the cause of diseases classified elsewhere: Secondary | ICD-10-CM | POA: Diagnosis not present

## 2021-09-26 DIAGNOSIS — Y95 Nosocomial condition: Secondary | ICD-10-CM | POA: Diagnosis present

## 2021-09-26 DIAGNOSIS — B955 Unspecified streptococcus as the cause of diseases classified elsewhere: Secondary | ICD-10-CM | POA: Diagnosis not present

## 2021-09-26 DIAGNOSIS — E785 Hyperlipidemia, unspecified: Secondary | ICD-10-CM | POA: Diagnosis present

## 2021-09-26 DIAGNOSIS — E119 Type 2 diabetes mellitus without complications: Secondary | ICD-10-CM | POA: Diagnosis not present

## 2021-09-26 DIAGNOSIS — Z20822 Contact with and (suspected) exposure to covid-19: Secondary | ICD-10-CM | POA: Diagnosis present

## 2021-09-26 DIAGNOSIS — E1065 Type 1 diabetes mellitus with hyperglycemia: Secondary | ICD-10-CM | POA: Diagnosis present

## 2021-09-26 DIAGNOSIS — A419 Sepsis, unspecified organism: Secondary | ICD-10-CM | POA: Diagnosis not present

## 2021-09-26 DIAGNOSIS — M7989 Other specified soft tissue disorders: Secondary | ICD-10-CM | POA: Diagnosis present

## 2021-09-26 DIAGNOSIS — R1013 Epigastric pain: Secondary | ICD-10-CM | POA: Diagnosis not present

## 2021-09-26 DIAGNOSIS — E8809 Other disorders of plasma-protein metabolism, not elsewhere classified: Secondary | ICD-10-CM | POA: Diagnosis present

## 2021-09-26 DIAGNOSIS — N179 Acute kidney failure, unspecified: Secondary | ICD-10-CM | POA: Diagnosis not present

## 2021-09-26 HISTORY — DX: Pneumonia, unspecified organism: J18.9

## 2021-09-26 HISTORY — DX: Severe sepsis with septic shock: R65.21

## 2021-09-26 LAB — COMPREHENSIVE METABOLIC PANEL
ALT: 11 U/L (ref 0–44)
AST: 24 U/L (ref 15–41)
Albumin: <1.5 g/dL — ABNORMAL LOW (ref 3.5–5.0)
Alkaline Phosphatase: 104 U/L (ref 38–126)
Anion gap: 7 (ref 5–15)
BUN: 22 mg/dL — ABNORMAL HIGH (ref 6–20)
CO2: 28 mmol/L (ref 22–32)
Calcium: 8 mg/dL — ABNORMAL LOW (ref 8.9–10.3)
Chloride: 107 mmol/L (ref 98–111)
Creatinine, Ser: 1.48 mg/dL — ABNORMAL HIGH (ref 0.44–1.00)
GFR, Estimated: 51 mL/min — ABNORMAL LOW (ref >60)
Glucose, Bld: 125 mg/dL — ABNORMAL HIGH (ref 70–99)
Potassium: 3.4 mmol/L — ABNORMAL LOW (ref 3.5–5.1)
Sodium: 142 mmol/L (ref 135–145)
Total Bilirubin: 0.5 mg/dL (ref 0.3–1.2)
Total Protein: 5.2 g/dL — ABNORMAL LOW (ref 6.5–8.1)

## 2021-09-26 LAB — URINALYSIS, COMPLETE (UACMP) WITH MICROSCOPIC
Bilirubin Urine: NEGATIVE
Glucose, UA: 50 mg/dL — AB
Ketones, ur: NEGATIVE mg/dL
Leukocytes,Ua: NEGATIVE
Nitrite: NEGATIVE
Protein, ur: >=300 mg/dL — AB
Specific Gravity, Urine: 1.028 (ref 1.005–1.030)
pH: 6 (ref 5.0–8.0)

## 2021-09-26 LAB — LACTIC ACID, PLASMA
Lactic Acid, Venous: 2.2 mmol/L (ref 0.5–1.9)
Lactic Acid, Venous: 3 mmol/L (ref 0.5–1.9)
Lactic Acid, Venous: 3.4 mmol/L (ref 0.5–1.9)
Lactic Acid, Venous: 3.1 mmol/L (ref 0.5–1.9)
Lactic Acid, Venous: 4.3 mmol/L (ref 0.5–1.9)
Lactic Acid, Venous: 4.4 mmol/L (ref 0.5–1.9)

## 2021-09-26 LAB — BLOOD CULTURE ID PANEL (REFLEXED) - BCID2

## 2021-09-26 LAB — RESPIRATORY PANEL BY PCR

## 2021-09-26 LAB — CBC WITH DIFFERENTIAL/PLATELET
Abs Immature Granulocytes: 0.06 10*3/uL (ref 0.00–0.07)
Basophils Absolute: 0.1 10*3/uL (ref 0.0–0.1)
Basophils Relative: 0 %
Eosinophils Absolute: 0.1 10*3/uL (ref 0.0–0.5)
Eosinophils Relative: 1 %
HCT: 43.1 % (ref 36.0–46.0)
Hemoglobin: 14 g/dL (ref 12.0–15.0)
Immature Granulocytes: 0 %
Lymphocytes Relative: 10 %
Lymphs Abs: 1.7 10*3/uL (ref 0.7–4.0)
MCH: 28.2 pg (ref 26.0–34.0)
MCHC: 32.5 g/dL (ref 30.0–36.0)
MCV: 86.7 fL (ref 80.0–100.0)
Monocytes Absolute: 0.5 10*3/uL (ref 0.1–1.0)
Monocytes Relative: 3 %
Neutro Abs: 15.1 10*3/uL — ABNORMAL HIGH (ref 1.7–7.7)
Neutrophils Relative %: 86 %
Platelets: 336 10*3/uL (ref 150–400)
RBC: 4.97 MIL/uL (ref 3.87–5.11)
RDW: 13.9 % (ref 11.5–15.5)
WBC: 17.5 10*3/uL — ABNORMAL HIGH (ref 4.0–10.5)
nRBC: 0 % (ref 0.0–0.2)

## 2021-09-26 LAB — GLUCOSE, CAPILLARY
Glucose-Capillary: 86 mg/dL (ref 70–99)
Glucose-Capillary: 75 mg/dL (ref 70–99)
Glucose-Capillary: 173 mg/dL — ABNORMAL HIGH (ref 70–99)
Glucose-Capillary: 104 mg/dL — ABNORMAL HIGH (ref 70–99)

## 2021-09-26 LAB — BLOOD GAS, VENOUS
Acid-base deficit: 1.5 mmol/L (ref 0.0–2.0)
Bicarbonate: 21.3 mmol/L (ref 20.0–28.0)
O2 Saturation: 86.4 %
Patient temperature: 37
pCO2, Ven: 30 mmHg — ABNORMAL LOW (ref 44–60)
pH, Ven: 7.46 — ABNORMAL HIGH (ref 7.25–7.43)
pO2, Ven: 53 mmHg — ABNORMAL HIGH (ref 32–45)

## 2021-09-26 LAB — RESP PANEL BY RT-PCR (FLU A&B, COVID) ARPGX2
Influenza A by PCR: NEGATIVE
Influenza B by PCR: NEGATIVE
SARS Coronavirus 2 by RT PCR: NEGATIVE

## 2021-09-26 LAB — PROTIME-INR
INR: 1.2 (ref 0.8–1.2)
Prothrombin Time: 15.3 seconds — ABNORMAL HIGH (ref 11.4–15.2)

## 2021-09-26 LAB — STREP PNEUMONIAE URINARY ANTIGEN: Strep Pneumo Urinary Antigen: NEGATIVE

## 2021-09-26 LAB — MAGNESIUM: Magnesium: 1.5 mg/dL — ABNORMAL LOW (ref 1.7–2.4)

## 2021-09-26 LAB — LIPASE, BLOOD: Lipase: 31 U/L (ref 11–51)

## 2021-09-26 LAB — HCG, QUANTITATIVE, PREGNANCY: hCG, Beta Chain, Quant, S: 1 m[IU]/mL (ref <5)

## 2021-09-26 LAB — MRSA NEXT GEN BY PCR, NASAL: MRSA by PCR Next Gen: NOT DETECTED

## 2021-09-26 LAB — CBG MONITORING, ED: Glucose-Capillary: 95 mg/dL (ref 70–99)

## 2021-09-26 LAB — PROCALCITONIN: Procalcitonin: 30.05 ng/mL

## 2021-09-26 LAB — SALICYLATE LEVEL: Salicylate Lvl: <7 mg/dL — ABNORMAL LOW (ref 7.0–30.0)

## 2021-09-26 MED ORDER — METOCLOPRAMIDE HCL 5 MG/ML IJ SOLN
10.0000 mg | Freq: Once | INTRAMUSCULAR | Status: AC
  Administered 2021-09-26: 10 mg via INTRAVENOUS
  Filled 2021-09-26: qty 2

## 2021-09-26 MED ORDER — ONDANSETRON HCL 4 MG/2ML IJ SOLN
4.0000 mg | Freq: Three times a day (TID) | INTRAMUSCULAR | Status: DC | PRN
Start: 2021-09-26 — End: 2021-09-30

## 2021-09-26 MED ORDER — SODIUM CHLORIDE 0.9 % IV SOLN: INTRAVENOUS | Status: DC

## 2021-09-26 MED ORDER — CHLORHEXIDINE GLUCONATE CLOTH 2 % EX PADS
6.0000 | MEDICATED_PAD | Freq: Every day | CUTANEOUS | Status: DC
  Administered 2021-09-27: 6 via TOPICAL

## 2021-09-26 MED ORDER — FENTANYL CITRATE PF 50 MCG/ML IJ SOSY
50.0000 ug | PREFILLED_SYRINGE | Freq: Once | INTRAMUSCULAR | Status: AC
  Administered 2021-09-26: 50 ug via INTRAVENOUS
  Filled 2021-09-26: qty 1

## 2021-09-26 MED ORDER — ONDANSETRON HCL 4 MG/2ML IJ SOLN
4.0000 mg | Freq: Once | INTRAMUSCULAR | Status: AC
  Administered 2021-09-26: 4 mg via INTRAVENOUS
  Filled 2021-09-26: qty 2

## 2021-09-26 MED ORDER — ALBUMIN HUMAN 25 % IV SOLN
12.5000 g | Freq: Four times a day (QID) | INTRAVENOUS | Status: AC
  Administered 2021-09-26 – 2021-09-27 (×4): 12.5 g via INTRAVENOUS
  Filled 2021-09-26 (×4): qty 50

## 2021-09-26 MED ORDER — LACTATED RINGERS IV BOLUS
1000.0000 mL | Freq: Once | INTRAVENOUS | Status: AC
  Administered 2021-09-26: 1000 mL via INTRAVENOUS

## 2021-09-26 MED ORDER — HALOPERIDOL LACTATE 5 MG/ML IJ SOLN
5.0000 mg | Freq: Once | INTRAMUSCULAR | Status: AC
  Administered 2021-09-26: 5 mg via INTRAVENOUS
  Filled 2021-09-26: qty 1

## 2021-09-26 MED ORDER — ALBUTEROL SULFATE HFA 108 (90 BASE) MCG/ACT IN AERS
2.0000 | INHALATION_SPRAY | RESPIRATORY_TRACT | Status: DC | PRN
Start: 2021-09-26 — End: 2021-09-30

## 2021-09-26 MED ORDER — HEPARIN SODIUM (PORCINE) 5000 UNIT/ML IJ SOLN
5000.0000 [IU] | Freq: Three times a day (TID) | INTRAMUSCULAR | Status: DC
  Administered 2021-09-26 – 2021-09-30 (×13): 5000 [IU] via SUBCUTANEOUS
  Filled 2021-09-26 (×13): qty 1

## 2021-09-26 MED ORDER — TACROLIMUS 1 MG PO CAPS
2.0000 mg | ORAL_CAPSULE | Freq: Two times a day (BID) | ORAL | Status: DC
Start: 2021-09-26 — End: 2021-09-26
  Filled 2021-09-26: qty 2

## 2021-09-26 MED ORDER — SODIUM CHLORIDE 0.9 % IV SOLN: 500.0000 mg | INTRAVENOUS | Status: DC

## 2021-09-26 MED ORDER — INSULIN ASPART 100 UNIT/ML IJ SOLN: 0.0000 [IU] | Freq: Three times a day (TID) | INTRAMUSCULAR | Status: DC

## 2021-09-26 MED ORDER — NOREPINEPHRINE 4 MG/250ML-% IV SOLN: 2.0000 ug/min | INTRAVENOUS | Status: DC

## 2021-09-26 MED ORDER — METOCLOPRAMIDE HCL 5 MG/ML IJ SOLN
5.0000 mg | Freq: Three times a day (TID) | INTRAMUSCULAR | Status: DC
  Administered 2021-09-26 – 2021-09-27 (×3): 5 mg via INTRAVENOUS
  Filled 2021-09-26 (×3): qty 2

## 2021-09-26 MED ORDER — HYDRALAZINE HCL 20 MG/ML IJ SOLN
5.0000 mg | INTRAMUSCULAR | Status: DC | PRN
  Filled 2021-09-26: qty 1

## 2021-09-26 MED ORDER — POTASSIUM CHLORIDE CRYS ER 20 MEQ PO TBCR: 40.0000 meq | EXTENDED_RELEASE_TABLET | Freq: Once | ORAL | Status: DC

## 2021-09-26 MED ORDER — INSULIN ASPART 100 UNIT/ML IJ SOLN
0.0000 [IU] | INTRAMUSCULAR | Status: DC
  Administered 2021-09-26: 2 [IU] via SUBCUTANEOUS
  Filled 2021-09-26: qty 1

## 2021-09-26 MED ORDER — ATORVASTATIN CALCIUM 20 MG PO TABS
40.0000 mg | ORAL_TABLET | Freq: Every evening | ORAL | Status: DC
Start: 2021-09-26 — End: 2021-09-26

## 2021-09-26 MED ORDER — LACTATED RINGERS IV BOLUS: 1000.0000 mL | Freq: Once | INTRAVENOUS | Status: DC

## 2021-09-26 MED ORDER — MAGNESIUM SULFATE 2 GM/50ML IV SOLN
2.0000 g | Freq: Once | INTRAVENOUS | Status: AC
Start: 2021-09-26 — End: 2021-09-26
  Administered 2021-09-26: 2 g via INTRAVENOUS
  Filled 2021-09-26: qty 50

## 2021-09-26 MED ORDER — NOREPINEPHRINE 4 MG/250ML-% IV SOLN
0.0000 ug/min | INTRAVENOUS | Status: DC
Start: 2021-09-26 — End: 2021-09-26

## 2021-09-26 MED ORDER — LACTATED RINGERS IV BOLUS
500.0000 mL | Freq: Once | INTRAVENOUS | Status: DC
Start: 2021-09-26 — End: 2021-09-26

## 2021-09-26 MED ORDER — INSULIN GLARGINE-YFGN 100 UNIT/ML ~~LOC~~ SOLN
20.0000 [IU] | Freq: Every day | SUBCUTANEOUS | Status: DC
Start: 2021-09-26 — End: 2021-09-26

## 2021-09-26 MED ORDER — MORPHINE SULFATE (PF) 2 MG/ML IV SOLN
2.0000 mg | INTRAVENOUS | Status: DC | PRN
  Administered 2021-09-26 (×3): 2 mg via INTRAVENOUS
  Filled 2021-09-26 (×3): qty 1

## 2021-09-26 MED ORDER — VANCOMYCIN HCL 750 MG/150ML IV SOLN
750.0000 mg | INTRAVENOUS | Status: DC
Start: 2021-09-27 — End: 2021-09-26

## 2021-09-26 MED ORDER — ACETAMINOPHEN 325 MG PO TABS
650.0000 mg | ORAL_TABLET | ORAL | Status: DC | PRN
  Administered 2021-09-26 – 2021-09-28 (×3): 650 mg via ORAL
  Filled 2021-09-26 (×3): qty 2

## 2021-09-26 MED ORDER — INSULIN ASPART 100 UNIT/ML IJ SOLN: 0.0000 [IU] | Freq: Every day | INTRAMUSCULAR | Status: DC

## 2021-09-26 MED ORDER — SODIUM CHLORIDE 0.9 % IV SOLN: INTRAVENOUS | Status: DC | PRN

## 2021-09-26 MED ORDER — VANCOMYCIN HCL 1500 MG/300ML IV SOLN
1500.0000 mg | Freq: Once | INTRAVENOUS | Status: AC
  Administered 2021-09-26: 1500 mg via INTRAVENOUS
  Filled 2021-09-26 (×2): qty 300

## 2021-09-26 MED ORDER — MORPHINE SULFATE (PF) 2 MG/ML IV SOLN: 2.0000 mg | INTRAVENOUS | Status: DC | PRN

## 2021-09-26 MED ORDER — OXYCODONE-ACETAMINOPHEN 5-325 MG PO TABS: 1.0000 | ORAL_TABLET | ORAL | Status: DC | PRN

## 2021-09-26 MED ORDER — PIPERACILLIN-TAZOBACTAM 3.375 G IVPB
3.3750 g | Freq: Three times a day (TID) | INTRAVENOUS | Status: DC
Start: 2021-09-26 — End: 2021-09-26

## 2021-09-26 MED ORDER — ONDANSETRON 4 MG PO TBDP: 4.0000 mg | ORAL_TABLET | Freq: Three times a day (TID) | ORAL | 0 refills | Status: DC | PRN

## 2021-09-26 MED ORDER — ACETAMINOPHEN 325 MG PO TABS
650.0000 mg | ORAL_TABLET | Freq: Four times a day (QID) | ORAL | Status: DC | PRN
Start: 2021-09-26 — End: 2021-09-26
  Administered 2021-09-26 (×2): 650 mg via ORAL
  Filled 2021-09-26 (×2): qty 2

## 2021-09-26 MED ORDER — POTASSIUM CHLORIDE 10 MEQ/100ML IV SOLN
10.0000 meq | INTRAVENOUS | Status: AC
  Administered 2021-09-26 (×4): 10 meq via INTRAVENOUS
  Filled 2021-09-26 (×5): qty 100

## 2021-09-26 MED ORDER — SODIUM CHLORIDE 0.9 % IV SOLN
INTRAVENOUS | Status: DC | PRN
Start: 2021-09-26 — End: 2021-09-30

## 2021-09-26 MED ORDER — VANCOMYCIN VARIABLE DOSE PER UNSTABLE RENAL FUNCTION (PHARMACIST DOSING): Status: DC

## 2021-09-26 MED ORDER — PIPERACILLIN-TAZOBACTAM 3.375 G IVPB
3.3750 g | Freq: Three times a day (TID) | INTRAVENOUS | Status: AC
  Administered 2021-09-26 – 2021-09-27 (×5): 3.375 g via INTRAVENOUS
  Filled 2021-09-26 (×5): qty 50

## 2021-09-26 MED ORDER — PIPERACILLIN-TAZOBACTAM 3.375 G IVPB 30 MIN
3.3750 g | Freq: Once | INTRAVENOUS | Status: AC
  Administered 2021-09-26: 3.375 g via INTRAVENOUS
  Filled 2021-09-26: qty 50

## 2021-09-26 MED ORDER — DM-GUAIFENESIN ER 30-600 MG PO TB12
1.0000 | ORAL_TABLET | Freq: Two times a day (BID) | ORAL | Status: DC | PRN
Start: 2021-09-26 — End: 2021-09-30
  Administered 2021-09-28 (×2): 1 via ORAL
  Filled 2021-09-26 (×2): qty 1

## 2021-09-26 NOTE — Consult Note (Signed)
SURGICAL CONSULTATION NOTE  ? ?HISTORY OF PRESENT ILLNESS (HPI):  ?21 y.o. female presented to [facility] ED for evaluation of abdominal pain since yesterday night. Patient reports she has been not feeling well for the last 3 weeks.  She was given admitted 2 weeks ago due to DKA and exacerbated nephrotic syndrome.  Once stabilized she was discharged on 09/06/2021.  On 09/19/2021 she came back to the ED complaining of facial swelling.  She was treated with Lasix at the ED and discharged home.  Now she come back with nausea, multiple episode of vomiting and abdominal pain.  Patient endorsed that the abdominal pain is not localized.  She endorses that the pain radiates to her back.  She endorses that she has had at least 5 days an episode of vomiting since last night. ? ?At the ED she was found with generalized swelling and edema.  She was found with mild abdominal tenderness.  She was found with fever of 102.5.  Her heart rate has been between 110-125.  Her labs shows leukocytosis of 17,000.  There is mild increase of creatinine to 1.4.  She had a CT scan of the abdomen and pelvis without contrast that shows no intra-abdominal pathology.  I personally ordered the images.  I was able to identify generalized edema consistent with her history of nephrotic syndrome. ? ?Surgery is consulted by Dr. Blaine Hamper in this context for evaluation and management of abdominal pain. ? ?PAST MEDICAL HISTORY (PMH):  ?Past Medical History:  ?Diagnosis Date  ? Diabetes mellitus without complication ([place])   ? 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 Nephritic syndrome   ? Nephrotic syndrome 08/23/2019  ?  ? ?PAST SURGICAL HISTORY ([place]):  ?History reviewed. No pertinent surgical history.  ? ?MEDICATIONS:  ?Prior to Admission medications   ?Medication Sig Start Date End Date Taking? Authorizing Provider  ?atorvastatin (LIPITOR) 40 MG tablet Take 1 tablet (40 mg total) by mouth daily. 09/06/21 12/05/21 Yes Enzo Bi, MD  ?furosemide (LASIX) 20 MG tablet Take 1  tablet (20 mg total) by mouth daily for 12 days. 09/19/21 10/01/21 Yes Blake Divine, MD  ?glucagon 1 MG injection Inject 1 mg into the muscle as needed. 02/26/19  Yes [provider]  ?insulin aspart (NOVOLOG) 100 UNIT/ML injection Inject 5-8 Units into the skin 3 (three) times daily before meals. 09/06/21 12/05/21 Yes Enzo Bi, MD  ?LANTUS 100 UNIT/ML injection Inject 0.3 mLs (30 Units total) into the skin at bedtime. 09/06/21 12/05/21 Yes Enzo Bi, MD  ?ondansetron (ZOFRAN-ODT) 4 MG disintegrating tablet Take 1 tablet (4 mg total) by mouth every 8 (eight) hours as needed for nausea or vomiting. 09/26/21  Yes Alfred Levins, [place], MD  ?tacrolimus (PROGRAF) 1 MG capsule Take 2 mg by mouth 2 (two) times daily.   Yes [provider]  ?acetaminophen (TYLENOL) 325 MG tablet Take 325 mg by mouth every 6 (six) hours as needed. ?Patient not taking: Reported on 09/26/2021 01/14/17   [provider]  ?blood glucose meter kit and supplies Dispense based on patient and insurance preference. Use up to four times daily as directed. (FOR ICD-10 E10.9, E11.9). 09/06/21   Enzo Bi, MD  ?  ? ?ALLERGIES:  ?No Known Allergies  ? ?SOCIAL HISTORY:  ?Social History  ? ?Socioeconomic History  ? Marital status: Single  ?  Spouse name: Not on file  ? Number of children: Not on file  ? Years of education: Not on file  ? Highest education level: Not on file  ?  Occupational History  ? Not on file  ?Tobacco Use  ? Smoking status: Never  ? Smokeless tobacco: Never  ?Vaping Use  ? Vaping Use: Never used  ?Substance and Sexual Activity  ? Alcohol use: Not Currently  ? Drug use: Not Currently  ? Sexual activity: Not Currently  ?Other Topics Concern  ? Not on file  ?Social History Narrative  ? Not on file  ? ?Social Determinants of Health  ? ?Financial Resource Strain: Not on file  ?Food Insecurity: Not on file  ?Transportation Needs: Not on file  ?Physical Activity: Not on file  ?Stress: Not on file  ?Social Connections: Not on file   ?Intimate Partner Violence: Not on file  ?  ? ? ?FAMILY HISTORY:  ?History reviewed. No pertinent family history.  ? ?REVIEW OF SYSTEMS:  ?Constitutional: denies weight loss, fever, chills, or sweats  ?Eyes: denies any other vision changes, history of eye injury  ?ENT: denies sore throat, hearing problems  ?Respiratory: denies shortness of breath, wheezing  ?Cardiovascular: denies chest pain, palpitations.  Positive for generalized swelling ?Gastrointestinal: Positive abdominal pain, nausea and vomiting ?Genitourinary: denies burning with urination or urinary frequency ?Musculoskeletal: denies any other joint pains or cramps  ?Skin: denies any other rashes or skin discolorations  ?Neurological: denies any other headache, dizziness, weakness  ?Psychiatric: denies any other depression, anxiety  ? ?All other review of systems were negative  ? ?VITAL SIGNS:  ?Temp:  [98.9 ?F (37.2 ?C)-102.5 ?F (39.2 ?C)] 99.3 ?F (37.4 ?C) (03/27 1239) ?Pulse Rate:  [110-131] 125 (03/27 1300) ?Resp:  [26-38] 35 (03/27 1300) ?BP: (77-136)/(42-106) 108/60 (03/27 1300) ?SpO2:  [87 %-100 %] 94 % (03/27 1300) ?Weight:  [60 kg] 60 kg (03/27 0409)     Height: 4' 11"  (149.9 cm) Weight: 60 kg BMI (Calculated): 26.7  ? ?INTAKE/OUTPUT:  ?This shift: Total I/O ?In: 2628.8 [I.V.:415.7; IV Piggyback:2213.1] ?Out: 100 [Urine:100]  ?Last 2 shifts: @IOLAST2SHIFTS @  ? ?PHYSICAL EXAM:  ?Constitutional:  ?-- Generalized body edema ?-- Awake, alert, and oriented x3  ?Eyes:  ?-- Pupils equally round and reactive to light  ?-- No scleral icterus  ?Ear, nose, and throat:  ?-- No jugular venous distension  ?Pulmonary:  ?-- No crackles  ?-- Equal breath sounds bilaterally ?-- Breathing non-labored at rest ?Cardiovascular:  ?-- S1, S2 present  ?-- No pericardial rubs ?Gastrointestinal:  ?-- Abdomen soft, mild tender, non-distended, no guarding or rebound tenderness ?-- No abdominal masses appreciated, pulsatile or otherwise  ?Musculoskeletal and Integumentary:   ?-- Wounds: None appreciated ?-- Extremities: B/L UE and LE FROM, hands and feet warm, no edema  ?Neurologic:  ?-- Motor function: intact and symmetric ?-- Sensation: intact and symmetric ? ? ?Labs:  ? ?  Latest Ref Rng & Units 09/26/2021  ?  4:37 AM 09/19/2021  ? 11:38 AM 09/06/2021  ?  5:00 AM  ?CBC  ?WBC 4.0 - 10.5 K/uL 17.5   8.2   6.7    ?Hemoglobin 12.0 - 15.0 g/dL 14.0   13.7   12.5    ?Hematocrit 36.0 - 46.0 % 43.1   42.0   37.5    ?Platelets 150 - 400 K/uL 336   284   292    ? ? ?  Latest Ref Rng & Units 09/26/2021  ?  4:37 AM 09/19/2021  ? 11:38 AM 09/06/2021  ?  5:00 AM  ?CMP  ?Glucose 70 - 99 mg/dL 125   391   267    ?BUN 6 -  20 mg/dL 22   24   17     ?Creatinine 0.44 - 1.00 mg/dL 1.48   1.07   0.64    ?Sodium 135 - 145 mmol/L 142   136   136    ?Potassium 3.5 - 5.1 mmol/L 3.4   4.1   4.1    ?Chloride 98 - 111 mmol/L 107   102   107    ?CO2 22 - 32 mmol/L 28   24   23     ?Calcium 8.9 - 10.3 mg/dL 8.0   7.8   7.9    ?Total Protein 6.5 - 8.1 g/dL 5.2   5.1     ?Total Bilirubin 0.3 - 1.2 mg/dL 0.5   0.4     ?Alkaline Phos 38 - 126 U/L 104   103     ?AST 15 - 41 U/L 24   15     ?ALT 0 - 44 U/L 11   10     ? ? ?Imaging studies:  ?EXAM: ?CT ABDOMEN AND PELVIS WITHOUT CONTRAST ?  ?TECHNIQUE: ?Multidetector CT imaging of the abdomen and pelvis was performed ?following the standard protocol without IV contrast. ?  ?RADIATION DOSE REDUCTION: This exam was performed according to the ?departmental dose-optimization program which includes automated ?exposure control, adjustment of the mA and/or kV according to ?patient size and/or use of iterative reconstruction technique. ?  ?COMPARISON:  10/07/2019 ?  ?FINDINGS: ?Lower chest: Mild dependent atelectasis. Minor fibrotic changes in ?the posterior costophrenic sulci, stable. ?  ?Hepatobiliary: Prominent density of the liver. No focal lesion is ?seen.No evidence of biliary obstruction or stone. ?  ?Pancreas: Unremarkable. ?  ?Spleen: Unremarkable. ?  ?Adrenals/Urinary Tract:  Negative adrenals. No hydronephrosis or ?stone. Unremarkable bladder. ?  ?Stomach/Bowel: No obstruction. Prominent colonic wall thickness ?proximally is likely from submucosal fat deposition. No convincing ?inf

## 2021-09-26 NOTE — ED Notes (Signed)
Surgeon at bedside. Mother and father at bedside. ?

## 2021-09-26 NOTE — Consult Note (Signed)
CODE SEPSIS - PHARMACY COMMUNICATION ? ?**Broad Spectrum Antibiotics should be administered within 1 hour of Sepsis diagnosis** ? ?Time Code Sepsis Called/Page Received: 0730 ? ?Antibiotics Ordered: Zosyn ? ?Time of 1st antibiotic administration: 0806 ? ? ? ? ?Sharen Hones, PharmD, BCPS ?Clinical Pharmacist   ?09/26/2021  8:12 AM  ?

## 2021-09-26 NOTE — ED Notes (Signed)
ICU MD was just at bedside. Pt may go to step down instead of ICU. Pt resting in bed, MAP is >65 at this time, still tachypneic and tachycardic. ?

## 2021-09-26 NOTE — Progress Notes (Signed)
Elink following code sepsis °

## 2021-09-26 NOTE — ED Notes (Signed)
Called ICU to give report, secretary took name and will have RN call this RN back for report. ?

## 2021-09-26 NOTE — ED Provider Notes (Signed)
? ?[facility] ?Provider Note ? ? ? Event Date/Time  ? First MD Initiated Contact with Patient 09/26/21 [id number]   ?  (approximate) ? ? ?History  ? ?Abdominal Pain ? ? ?HPI ? ?Catherine [name] is a 21 y.o. female with a history of type 1 diabetes and nephrotic syndrome who presents for evaluation of abdominal pain.  Patient reports pain started at 1 AM.  Pain is severe, diffuse, sharp, associated with nausea and nonbloody nonbilious emesis.  No diarrhea, no constipation, no dysuria, no hematuria, no chest pain, no shortness of breath, no fever.  Patient denies any prior abdominal surgeries. ?  ? ? ?Past Medical History:  ?Diagnosis Date  ? Diabetes mellitus without complication (H[facility])   ? 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 Nephritic syndrome   ? Nephrotic syndrome 08/23/2019  ? ? ?No past surgical history on file. ? ? ?Physical Exam  ? ?Triage Vital Signs: ?ED Triage Vitals [09/26/21 0409]  ?Enc Vitals Group  ?   BP (!) 136/106  ?   Pulse Rate (!) 110  ?   Resp (!) 26  ?   Temp 99.7 ?F (37.6 ?C)  ?   Temp Source Oral  ?   SpO2 100 %  ?   Weight 132 lb 4.4 oz (60 kg)  ?   Height 4\' 11"  (1.499 m)  ?   Head Circumference   ?   Peak Flow   ?   Pain Score 10  ?   Pain Loc   ?   Pain Edu?   ?   Excl. in GC?   ? ? ?Most recent vital signs: ?Vitals:  ? 09/26/21 0500 09/26/21 0600  ?BP: 112/72 (!) 99/58  ?Pulse: (!) 115 (!) 127  ?Resp:    ?Temp:    ?SpO2: 92% 90%  ? ? ? ?Constitutional: Alert and oriented, curled on the stretcher with severe pain ?HEENT: ?     Head: Normocephalic and atraumatic.    ?     Eyes: Conjunctivae are normal. Sclera is non-icteric.  ?     Mouth/Throat: Mucous membranes are moist.  ?     Neck: Supple with no signs of meningismus. ?Cardiovascular: Regular rate and rhythm. No murmurs, gallops, or rubs. 2+ symmetrical distal pulses are present in all extremities.  ?Respiratory: Normal respiratory effort. Lungs are clear to auscultation bilaterally.  ?Gastrointestinal: Soft, diffusely  tender to palpation, distended with positive bowel sounds. No rebound or guarding. ?Genitourinary: No CVA tenderness. ?Musculoskeletal:  No edema, cyanosis, or erythema of extremities. ?Neurologic: Normal speech and language. Face is symmetric. Moving all extremities. No gross focal neurologic deficits are appreciated. ?Skin: Skin is warm, dry and intact. No rash noted. ?Psychiatric: Mood and affect are normal. Speech and behavior are normal. ? ?ED Results / Procedures / Treatments  ? ?Labs ?(all labs ordered are listed, but only abnormal results are displayed) ?Labs Reviewed  ?CBC WITH DIFFERENTIAL/PLATELET - Abnormal; Notable for the following components:  ?    Result Value  ? WBC 17.5 (*)   ? Neutro Abs 15.1 (*)   ? All other components within normal limits  ?COMPREHENSIVE METABOLIC PANEL - Abnormal; Notable for the following components:  ? Potassium 3.4 (*)   ? Glucose, Bld 125 (*)   ? BUN 22 (*)   ? Creatinine, Ser 1.48 (*)   ? Calcium 8.0 (*)   ? Total Protein 5.2 (*)   ? Albumin <1.5 (*)   ? GFR, Estimated  51 (*)   ? All other components within normal limits  ?URINALYSIS, COMPLETE (UACMP) WITH MICROSCOPIC - Abnormal; Notable for the following components:  ? Color, Urine YELLOW (*)   ? APPearance CLOUDY (*)   ? Glucose, UA 50 (*)   ? Hgb urine dipstick SMALL (*)   ? Protein, ur >=300 (*)   ? Bacteria, UA RARE (*)   ? All other components within normal limits  ?URINE CULTURE  ?HCG, QUANTITATIVE, PREGNANCY  ?LIPASE, BLOOD  ? ? ? ?EKG ? ?none ? ? ?RADIOLOGY ?I, Nita Sickle, attending MD, have personally viewed and interpreted the images obtained during this visit as below: ? ?CT with no acute finding ? ? ?___________________________________________________ ?Interpretation by Radiologist:  ?CT ABDOMEN PELVIS WO CONTRAST ? ?Result Date: 09/26/2021 ?CLINICAL DATA:  Acute, nonlocalized abdominal pain EXAM: CT ABDOMEN AND PELVIS WITHOUT CONTRAST TECHNIQUE: Multidetector CT imaging of the abdomen and pelvis was  performed following the standard protocol without IV contrast. RADIATION DOSE REDUCTION: This exam was performed according to the departmental dose-optimization program which includes automated exposure control, adjustment of the mA and/or kV according to patient size and/or use of iterative reconstruction technique. COMPARISON:  10/07/2019 FINDINGS: Lower chest: Mild dependent atelectasis. Minor fibrotic changes in the posterior costophrenic sulci, stable. Hepatobiliary: Prominent density of the liver. No focal lesion is seen.No evidence of biliary obstruction or stone. Pancreas: Unremarkable. Spleen: Unremarkable. Adrenals/Urinary Tract: Negative adrenals. No hydronephrosis or stone. Unremarkable bladder. Stomach/Bowel: No obstruction. Prominent colonic wall thickness proximally is likely from submucosal fat deposition. No convincing inflammatory bowel wall thickening. No bowel obstruction. Negative appendix Vascular/Lymphatic: No acute vascular abnormality. No mass or adenopathy. Reproductive:No pathologic findings. Other: Small volume peritoneal fluid.  Body wall edema Musculoskeletal: No acute abnormalities. Mild fat deposition in the filum terminalis. IMPRESSION: 1. No definite cause for symptoms. 2. Body wall edema and trace ascites, presumably third-spacing. 3. Prominent density of the liver, are there risk factors for hemo siderosis. Electronically Signed   By: Tiburcio Pea M.D.   On: 09/26/2021 06:25   ? ? ? ?PROCEDURES: ? ?Critical Care performed: No ? ?Procedures ? ? ? ?IMPRESSION / MDM / ASSESSMENT AND PLAN / ED COURSE  ?I reviewed the triage vital signs and the nursing notes. ? ? 21 y.o. female with a history of type 1 diabetes and nephrotic syndrome who presents for evaluation of abdominal pain.  Patient looks very uncomfortable, curled on the stretcher holding her abdomen.  Abdomen is distended with positive bowel sounds and diffusely tender to palpation. ? ?Ddx: Viral gastroenteritis versus  gastritis versus gastroparesis versus peptic ulcer disease versus pancreatitis versus gallbladder pathology versus SBO versus pyelonephritis versus kidney stone versus DKA versus perforated bowel versus ectopic pregnancy ? ? ?Plan: CBC, CMP, hCG, CMP, lipase, urinalysis, CT abdomen pelvis.  Will give IV fluids, Zofran and fentanyl. ? ? ?MEDICATIONS GIVEN IN ED: ?Medications  ?lactated ringers bolus 1,000 mL (0 mLs Intravenous Stopped 09/26/21 0602)  ?ondansetron [facility]) injection 4 mg (4 mg Intravenous Given 09/26/21 0435)  ?fentaNYL (SUBLIMAZE) injection 50 mcg (50 mcg Intravenous Given 09/26/21 0435)  ?metoCLOPramide (REGLAN) injection 10 mg (10 mg Intravenous Given 09/26/21 0515)  ?lactated ringers bolus 1,000 mL (1,000 mLs Intravenous New Bag/Given 09/26/21 0602)  ?haloperidol lactate (HALDOL) injection 5 mg (5 mg Intravenous Given 09/26/21 6283)  ? ? ? ?ED COURSE: UA negative for UTI.  Patient has proteinuria which is known and stable.  Negative pregnancy test patient does have a white count of 17.5 with  a left shift.  Mild AKI with a creatinine of 1.48, baseline is 0.6.  No signs of DKA, no significant electrolyte derangements.  LFTs and lipase are within normal limits.  CT abdomen pelvis showing no acute pathology.  Patient has received fentanyl, Zofran and Reglan and is still complaining of pain.  Possible gastroparesis with a history of diabetes.  Will give Haldol.  We will give a second liter bolus.  Care transferred to Dr. Terrilee Files at 7AM ? ? ?Consults: none ? ? ?EMR reviewed including patient's last visit to the hospital 3 weeks ago for hyperglycemia ? ? ? ?FINAL CLINICAL IMPRESSION(S) / ED DIAGNOSES  ? ?Final diagnoses:  ?Nausea and vomiting, unspecified vomiting type  ?Generalized abdominal pain  ? ? ? ?Rx / DC Orders  ? ?ED Discharge Orders   ? ?      Ordered  ?  ondansetron (ZOFRAN-ODT) 4 MG disintegrating tablet  Every 8 hours PRN       ? 09/26/21 0705  ? ?  ?  ? ?  ? ? ? ?Note:  This document was  prepared using Dragon voice recognition software and may include unintentional dictation errors. ? ? ?Please note:  Patient was evaluated in Emergency Department today for the symptoms described in the history

## 2021-09-26 NOTE — ED Notes (Signed)
Looking for IV pump to start potassium. ?

## 2021-09-26 NOTE — ED Notes (Signed)
Patient was encouraged to produce a urine specimen prior to receiving pain medication but patient states she is in too much pain. Immediately following administration of pain medication she asked for a blanket and for the lights to be turned off. I told patient that I would return in 30 min to get urine. Patient verbalize understanding. ?

## 2021-09-26 NOTE — H&P (Addendum)
?History and Physical  ? ? ?Catherine [name]:[id number] DOB: 07/24/2000 DOA: 09/26/2021 ? ?Referring MD/NP/PA:  ? ?PCP: [facility]  ? ?Patient coming from:  The patient is coming from home.  At baseline, pt is independent for most of ADL.       ? ?Chief Complaint: Abdominal pain ? ?HPI: Catherine [name] is a 21 y.o. female with medical history significant of type 1 diabetes, hypertension, hyperlipidemia, nephrotic syndrome on tacrolimus, who presents with abdominal pain. ? ?Patient states that her symptoms started in the early morning at about 1 AM.  Patient has nausea, vomiting and abdominal pain.  She has vomited more than 10 times with nonbilious nonbloody vomiting.  No diarrhea.  Her abdominal pain is diffuse, severe, sharp, nonradiating.  Patient denies subjective fever, but she has temperature 102.2 in ED.  No chills. Patient reports shortness of breath, no cough, chest pain.  She had 1 episode of oxygen desaturating to 87% on room air, which improved to 98-100% on room air later on.  Denies symptoms of UTI.  Patient is lethargic, but easily arousable, oriented x3.  Moves all extremities normally. ? ?Patient was initially hypotensive with blood pressure 86/57, which improved to 103/62 after giving 2 L LR bolus, but dropped to 77/50, then back to 94/51 after given 3L of LR. Levophed is ordered if MAP < 65 again.  ? ? ?Data Reviewed and ED Course: pt was found to have WBC 17.5, negative pregnancy test, urinalysis positive but with squamous cell contamination, potassium 3.4, AKI with creatinine 1.48, BUN 22, GFR 51 (baseline creatinine 1.07 on 09/19/2021).  Temperature normal, heart rate 128, 38, oxygen saturation 87-100% on room air.  CT abdomen/pelvis negative for acute intra-abdominal issues.  Patient is admitted to the ICU as inpatient. Consulted Dr. Jonnie Finner of ICU and Dr. Peyton Najjar of surgery.  ? ?CT-abd/pelvis: ?1. No definite cause for symptoms. ?2. Body wall edema and trace  ascites, presumably third-spacing. ?3. Prominent density of the liver, are there risk factors for hemo siderosis. ? ? ?EKG: I have personally reviewed.  Sinus rhythm, QTc 461, low voltage, poor R wave progression ? ?Review of Systems:  ? ?General: has fevers, no chills, no body weight gain, has poor appetite, has fatigue ?HEENT: no blurry vision, hearing changes or sore throat ?Respiratory: has dyspnea, no coughing, wheezing ?CV: no chest pain, no palpitations ?GI: has nausea, vomiting, abdominal pain, no diarrhea, constipation ?GU: no dysuria, burning on urination, increased urinary frequency, hematuria  ?Ext: has leg edema ?Neuro: no unilateral weakness, numbness, or tingling, no vision change or hearing loss ?Skin: no rash, no skin tear. ?MSK: No muscle spasm, no deformity, no limitation of range of movement in spin ?Heme: No easy bruising.  ?Travel history: No recent long distant travel. ? ? ?Allergy: No Known Allergies ? ?Past Medical History:  ?Diagnosis Date  ? Diabetes mellitus without complication ([place])   ? 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 Nephritic syndrome   ? Nephrotic syndrome 08/23/2019  ? ? ?History reviewed. No pertinent surgical history. ? ?Social History:  reports that she has never smoked. She has never used smokeless tobacco. She reports that she does not currently use alcohol. She reports that she does not currently use drugs. ? ?Family History: I reviewed with patient about family medical history, she states that all family members do not have significant medical issues. ? ?Prior to Admission medications   ?Medication Sig Start Date End Date Taking? Authorizing Provider  ?ondansetron (ZOFRAN-ODT)  4 MG disintegrating tablet Take 1 tablet (4 mg total) by mouth every 8 (eight) hours as needed for nausea or vomiting. 09/26/21  Yes Alfred Levins, [place], MD  ?acetaminophen (TYLENOL) 325 MG tablet Take 325 mg by mouth every 6 (six) hours as needed. 01/14/17   [provider]  ?atorvastatin  (LIPITOR) 40 MG tablet Take 1 tablet (40 mg total) by mouth daily. 09/06/21 12/05/21  Enzo Bi, MD  ?blood glucose meter kit and supplies Dispense based on patient and insurance preference. Use up to four times daily as directed. (FOR ICD-10 E10.9, E11.9). 09/06/21   Enzo Bi, MD  ?furosemide (LASIX) 20 MG tablet Take 1 tablet (20 mg total) by mouth daily for 12 days. 09/19/21 10/01/21  Blake Divine, MD  ?glucagon 1 MG injection Inject 1 mg into the muscle as needed. 02/26/19   [provider]  ?insulin aspart (NOVOLOG) 100 UNIT/ML injection Inject 5-8 Units into the skin 3 (three) times daily before meals. 09/06/21 12/05/21  Enzo Bi, MD  ?LANTUS 100 UNIT/ML injection Inject 0.3 mLs (30 Units total) into the skin at bedtime. 09/06/21 12/05/21  Enzo Bi, MD  ?tacrolimus (PROGRAF) 1 MG capsule Take 2 mg by mouth 2 (two) times daily.    [provider]  ? ? ?Physical Exam: ?Vitals:  ? 09/26/21 0837 09/26/21 0845 09/26/21 0900 09/26/21 0915  ?BP: (!) 94/51 (!) 103/57 (!) 99/57 (!) 101/58  ?Pulse: (!) 128 (!) 122 (!) 128 (!) 129  ?Resp: (!) 37 (!) 34 (!) 34 (!) 30  ?Temp:      ?TempSrc:      ?SpO2: 97% 100% 100% 100%  ?Weight:      ?Height:      ? ?General: Not in acute distress ?HEENT: ?      Eyes: PERRL, EOMI, no scleral icterus. ?      ENT: No discharge from the ears and nose, no pharynx injection, no tonsillar enlargement.  ?      Neck: No JVD, no bruit, no mass felt. ?Heme: No neck lymph node enlargement. ?Cardiac: S1/S2, RRR, No murmurs, No gallops or rubs. ?Respiratory: No rales, wheezing, rhonchi or rubs. ?GI: Soft, nondistended, has diffused tenderness with guarding on palpation, no rebound pain, no organomegaly, BS present. ?GU: No hematuria ?Ext: has 1+ pitting leg edema bilaterally. 1+DP/PT pulse bilaterally. ?Musculoskeletal: No joint deformities, No joint redness or warmth, no limitation of ROM in spin. ?Skin: No rashes.  ?Neuro: Lethargic, easily arousable, oriented X3, cranial nerves II-XII  grossly intact, moves all extremities normally.  ?Psych: Patient is not psychotic, no suicidal or hemocidal ideation. ? ?Labs on Admission: I have personally reviewed following labs and imaging studies ? ?CBC: ?Recent Labs  ?Lab 09/19/21 ?1138 09/26/21 ?0437  ?WBC 8.2 17.5*  ?NEUTROABS 5.5 15.1*  ?HGB 13.7 14.0  ?HCT 42.0 43.1  ?MCV 86.4 86.7  ?PLT 284 336  ? ?Basic Metabolic Panel: ?Recent Labs  ?Lab 09/19/21 ?1138 09/26/21 ?0437 09/26/21 ?0810  ?NA 136 142  --   ?K 4.1 3.4*  --   ?CL 102 107  --   ?CO2 24 28  --   ?GLUCOSE 391* 125*  --   ?BUN 24* 22*  --   ?CREATININE 1.07* 1.48*  --   ?CALCIUM 7.8* 8.0*  --   ?MG  --   --  1.5*  ? ?GFR: ?Estimated Creatinine Clearance: 47.4 mL/min (A) (by C-G formula based on SCr of 1.48 mg/dL (H)). ?Liver Function Tests: ?Recent Labs  ?Lab 09/19/21 ?1138  09/26/21 ?0437  ?AST 15 24  ?ALT 10 11  ?ALKPHOS 103 104  ?BILITOT 0.4 0.5  ?PROT 5.1* 5.2*  ?ALBUMIN <1.5* <1.5*  ? ?Recent Labs  ?Lab 09/26/21 ?7622  ?LIPASE 31  ? ?No results for input(s): AMMONIA in the last 168 hours. ?Coagulation Profile: ?No results for input(s): INR, PROTIME in the last 168 hours. ?Cardiac Enzymes: ?No results for input(s): CKTOTAL, CKMB, CKMBINDEX, TROPONINI in the last 168 hours. ?BNP (last 3 results) ?No results for input(s): PROBNP in the last 8760 hours. ?HbA1C: ?No results for input(s): HGBA1C in the last 72 hours. ?CBG: ?Recent Labs  ?Lab 09/26/21 ?6333  ?GLUCAP 95  ? ?Lipid Profile: ?No results for input(s): CHOL, HDL, LDLCALC, TRIG, CHOLHDL, LDLDIRECT in the last 72 hours. ?Thyroid Function Tests: ?No results for input(s): TSH, T4TOTAL, FREET4, T3FREE, THYROIDAB in the last 72 hours. ?Anemia Panel: ?No results for input(s): VITAMINB12, FOLATE, FERRITIN, TIBC, IRON, RETICCTPCT in the last 72 hours. ?Urine analysis: ?   ?Component Value Date/Time  ? COLORURINE YELLOW (A) 09/26/2021 0518  ? APPEARANCEUR CLOUDY (A) 09/26/2021 0518  ? LABSPEC 1.028 09/26/2021 0518  ? PHURINE 6.0 09/26/2021 0518  ?  GLUCOSEU 50 (A) 09/26/2021 0518  ? HGBUR SMALL (A) 09/26/2021 0518  ? [place] NEGATIVE 09/26/2021 0518  ? [place] NEGATIVE 09/26/2021 0518  ? PROTEINUR >=300 (A) 09/26/2021 0518  ? NITRITE NEGATIVE 09/27/18

## 2021-09-26 NOTE — Progress Notes (Signed)
PHARMACY - PHYSICIAN COMMUNICATION ?CRITICAL VALUE ALERT - BLOOD CULTURE IDENTIFICATION (BCID) ? ?Catherine [name] is an 21 y.o. female who presented to [facility] on 09/26/2021 with a chief complaint of abdominal pain ? ?Assessment:  1/4 bottles GPC. BCID detected Streptococcus agalactiae. Suspect GI/GU source.  ? ?Name of physician (or Provider) Contacted: Neomia Glass ? ?Current antibiotics: Vancomycin & Zosyn ? ?Changes to prescribed antibiotics recommended:  ?Recommend discontinue vancomycin and continue with Zosyn monotherapy for now ? ?Results for orders placed or performed during the hospital encounter of 09/26/21  ?Blood Culture ID Panel (Reflexed) (Collected: 09/26/2021  8:10 AM)  ?Result Value Ref Range  ? Enterococcus faecalis NOT DETECTED NOT DETECTED  ? Enterococcus Faecium NOT DETECTED NOT DETECTED  ? Listeria monocytogenes NOT DETECTED NOT DETECTED  ? Staphylococcus species NOT DETECTED NOT DETECTED  ? Staphylococcus aureus (BCID) NOT DETECTED NOT DETECTED  ? Staphylococcus epidermidis NOT DETECTED NOT DETECTED  ? Staphylococcus lugdunensis NOT DETECTED NOT DETECTED  ? Streptococcus species DETECTED (A) NOT DETECTED  ? Streptococcus agalactiae DETECTED (A) NOT DETECTED  ? Streptococcus pneumoniae NOT DETECTED NOT DETECTED  ? Streptococcus pyogenes NOT DETECTED NOT DETECTED  ? A.calcoaceticus-baumannii NOT DETECTED NOT DETECTED  ? Bacteroides fragilis NOT DETECTED NOT DETECTED  ? Enterobacterales NOT DETECTED NOT DETECTED  ? Enterobacter cloacae complex NOT DETECTED NOT DETECTED  ? Escherichia coli NOT DETECTED NOT DETECTED  ? Klebsiella aerogenes NOT DETECTED NOT DETECTED  ? Klebsiella oxytoca NOT DETECTED NOT DETECTED  ? Klebsiella pneumoniae NOT DETECTED NOT DETECTED  ? Proteus species NOT DETECTED NOT DETECTED  ? Salmonella species NOT DETECTED NOT DETECTED  ? Serratia marcescens NOT DETECTED NOT DETECTED  ? Haemophilus influenzae NOT DETECTED NOT DETECTED  ? Neisseria meningitidis NOT DETECTED  NOT DETECTED  ? Pseudomonas aeruginosa NOT DETECTED NOT DETECTED  ? Stenotrophomonas maltophilia NOT DETECTED NOT DETECTED  ? Candida albicans NOT DETECTED NOT DETECTED  ? Candida auris NOT DETECTED NOT DETECTED  ? Candida glabrata NOT DETECTED NOT DETECTED  ? Candida krusei NOT DETECTED NOT DETECTED  ? Candida parapsilosis NOT DETECTED NOT DETECTED  ? Candida tropicalis NOT DETECTED NOT DETECTED  ? Cryptococcus neoformans/gattii NOT DETECTED NOT DETECTED  ? ? ?[name] ?09/26/2021  8:07 PM ? ?

## 2021-09-26 NOTE — ED Notes (Signed)
Per Dr Blaine Hamper, if MAP less than 65 after next 1L bolus (3rd) then start NE. ?

## 2021-09-26 NOTE — ED Triage Notes (Addendum)
Pt states mid abd pain radiating to her back and vomiting that began at 0100 this am. Pt is hyperventilating in triage, states has chills. Pt states is diabetic and has nephrotic syndrome.  ?

## 2021-09-26 NOTE — ED Notes (Signed)
Dr Clyde Lundborg at [facility]. ? ?Will give PO tylenol now. ?

## 2021-09-26 NOTE — Consult Note (Signed)
Pharmacy Antibiotic Note ? ?Catherine [name] is a 21 y.o. female admitted on 09/26/2021 with abdominal pain. Patient with PMH of T1DM, nephrotic syndrome immunosuppressed on tacrolimus. Pharmacy has been consulted for Zosyn and Vancomycin dosing for sepsis with suspected source of PNA.  ?Scr elevated at 1.58 (BL ~ 0.7) ? ?Plan: ?Zosyn 3.375g IV q8h (4 hour infusion). ?Vancomycin 1500 mg LD x 1 ordered  ?Given AKI will withhold scheduled Vanco regimen at this time ?Ke: 0.038 t1/2: 18 hr Vd 43.2 ? ?Height: 4\' 11"  (149.9 cm) ?Weight: 60 kg (132 lb 4.4 oz) ?IBW/kg (Calculated) : 43.2 ? ?Temp (24hrs), Avg:99.7 ?F (37.6 ?C), Min:99.7 ?F (37.6 ?C), Max:99.7 ?F (37.6 ?C) ? ?Recent Labs  ?Lab 09/19/21 ?1138 09/26/21 ?0437  ?WBC 8.2 17.5*  ?CREATININE 1.07* 1.48*  ?  ?Estimated Creatinine Clearance: 47.4 mL/min (A) (by C-G formula based on SCr of 1.48 mg/dL (H)).   ? ?No Known Allergies ? ?Antimicrobials this admission: ?3/27 Zosyn >>  ?3/27 Vancomycin >>  ?3/27 Azithromycin >> ? ?Dose adjustments this admission: ? ? ?Microbiology results: ?3/27 BCx: ordered ?3/27 UCx: sent  ?3/27 Sputum: ordered  ?3/27 MRSA PCR: ordered ? ?Thank you for allowing pharmacy to be a part of this patient?s care. ? ?4/27, PharmD, BCPS ?Clinical Pharmacist   ?09/26/2021 8:12 AM ? ?

## 2021-09-26 NOTE — Progress Notes (Signed)
Notified provider and bedside nurse of need to order and draw repeat lactic acid #3.  

## 2021-09-26 NOTE — ED Notes (Signed)
Pt sleeping. Connected to 5 lead cardiac monitor. Pulse remains in 120s and pt appears tachypneic with shallow respirations but unlabored breathing. Second 1L bolus almost complete. EDP informed. ? ?EDP seeing pt at bedside now. ?

## 2021-09-26 NOTE — Plan of Care (Signed)
Received from ED this am accompanied by parents. Pt awake and alert, oriented x 4. LR bolus # 4 nearing completion upon arrival. NS infusing at 125 per hour, and IV K runs ongoing for total x 4. Mg 2 gm also given. PIV x 3 in place. Heart rhythm sinus tach 120's -130, resp rate in upper 30's. Sat 89-92%. Placed on 2 liters O2 [place]. BP MAP low 70's.  ?Patient currently with improved VS, more comfortable. Was medicated with morphine x 1 for abdominal pain with partial relief. Was able to tolerate getting OOB to [facility] to void. Tolerated small sips water; advanced to clear liquids. Tolerated apple juice thus far. Monitoring blood sugars every 4 hours. Lactic acid improved but still elevated; per Dr. Blaine Hamper, will cont to trend this evening. Albumin also given as ordered for nephrotic syndrome. Resp panel 20 pending.  ? ?Problem: Education: ?Goal: Knowledge of General Education information will improve ?Description: Including pain rating scale, medication(s)/side effects and non-pharmacologic comfort measures ?Outcome: Progressing ?  ?Problem: Health Behavior/Discharge Planning: ?Goal: Ability to manage health-related needs will improve ?Outcome: Progressing ?  ?Problem: Clinical Measurements: ?Goal: Ability to maintain clinical measurements within normal limits will improve ?Outcome: Progressing ?Goal: Will remain free from infection ?Outcome: Progressing ?Goal: Diagnostic test results will improve ?Outcome: Progressing ?Goal: Respiratory complications will improve ?Outcome: Progressing ?Goal: Cardiovascular complication will be avoided ?Outcome: Progressing ?  ?Problem: Activity: ?Goal: Risk for activity intolerance will decrease ?Outcome: Progressing ?  ?Problem: Nutrition: ?Goal: Adequate nutrition will be maintained ?Outcome: Progressing ?  ?Problem: Coping: ?Goal: Level of anxiety will decrease ?Outcome: Progressing ?  ?Problem: Elimination: ?Goal: Will not experience complications related to bowel motility ?Outcome:  Progressing ?Goal: Will not experience complications related to urinary retention ?Outcome: Progressing ?  ?Problem: Pain Managment: ?Goal: General experience of comfort will improve ?Outcome: Progressing ?  ?Problem: Safety: ?Goal: Ability to remain free from injury will improve ?Outcome: Progressing ?  ?Problem: Skin Integrity: ?Goal: Risk for impaired skin integrity will decrease ?Outcome: Progressing ?  ?

## 2021-09-26 NOTE — Consult Note (Signed)
? ?NAME:  Catherine [name], MRN:  [id number], DOB:  [birth year], LOS: 0 ?ADMISSION DATE:  09/26/2021, CONSULTATION DATE:  09/26/21  ?REFERRING MD:  Dr. Blaine Hamper, CHIEF COMPLAINT:  nausea, vomiting  ? ?History of Present Illness:  ?Catherine [name] is a 21 y.o. female admitted with fever, nausea, vomiting and back pain. 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 She endorses adherence with Prograf and Lasix. Denies sick contacts. No respiratory symptoms. No diarrhea. No recent travel. ? ?In the ED, she was tachypneic, tachycardic and hypotensive. She responded appropriately to volume resuscitation. Labs notable for leukocytosis with left shift, mild AKI with creatinine 1.48 (from baseline 1.0-1.1), lipase 31, lactate 2.2, procalcitonin 30.05. VBG showed pH 7.46, pCO2 30. U/A not indicative of infection. Salicylate level negative.  ? ?CXR shows new right sided consolidative opacities concerning for CAP. CT a/p w/o contrast showed no acute intraabdominal findings, lower chest revealed mild atelectasis and questionable early bibasilar fibrotic changes. ? ?Pertinent  Medical History  ?Minimal change disease with nephrotic syndrome ?Chronic immunosuppression ?Type 1 diabetes mellitus ? ?Significant Hospital Events: ?Including procedures, antibiotic start and stop dates in addition to other pertinent events   ?3/26: admitted to hospital ?3/27: PCCM consulted ? ?Interim History / Subjective:  ?N/A ? ?Objective   ?Blood pressure (!) 91/53, pulse (!) 127, temperature 100.1 ?F (37.8 ?C), temperature source Axillary, resp. rate (!) 38, height 4' 11"  (1.499 m), weight 60 kg, SpO2 100 %. ?   ?   ? ?Intake/Output Summary (Last 24 hours) at 09/26/2021 1018 ?Last data filed at 09/26/2021 2119 ?Gross per 24 hour  ?Intake 3100 ml  ?Output 100 ml  ?Net 3000 ml  ? ?Filed Weights  ? 09/26/21 0409  ?Weight: 60 kg  ? ? ?Examination: ?General: African American female in mild distress ?HENT:  PERRL, dry mucus membranes ?Lungs: tachypneic, crackles at bases bilaterally, coarse breath sounds in right anterior lung fields, no W/R ?Cardiovascular: tachycardic, no M/R/G ?Abdomen: soft, mildly distended, mild TTP, NABS ?Extremities: 1+ pitting edema in BLE and BUE, mild facial and periorbital swelling ?Neuro: A/Ox3, no focal deficits ?GU: deferred ? ?Resolved Hospital Problem list   ?N/A ? ?Assessment & Plan:  ?Severe sepsis 2/2 CAP in immunosuppressed patient ?Fluid-responsive hypotension in setting of sepsis, dehydration ?AKI ?Minimal change disease with nephrotic syndrome ?Type 1 diabetes mellitus ? ?- No acute ICU needs, recommend admission to Stepdown ?- Agree with broad spectrum empiric antibiotics ?- f/u culture data; send RVP, sputum cx ?- will need follow up chest imaging post-hospitalization to document resolution given immunosuppressed status ?- Aggressive volume resuscitation ?- Hold home antihypertensives ?- MAP goal >65 ?- monitor UOP; strict I/O; avoid nephrotoxins ?- consider Nephrology consult given history of MCD ?- hold Prograf until level returns; discuss goal level and resumption with Nephro ?- hold on basal insulin while NPO; q4H CBG checks with SSI ?- PCCM will continue to follow ? ?Best Practice (right click and "Reselect all SmartList Selections" daily)  ? ?Diet/type: NPO w/ oral meds ?DVT prophylaxis: prophylactic heparin  ?GI prophylaxis: N/A ?Lines: N/A ?Foley:  N/A ?Code Status:  full code ?Last date of multidisciplinary goals of care discussion [09/26/21] ? ?I Assessed the need for Labs ?I Assessed the need for Foley ?I Assessed the need for Central Venous Line ?Family Discussion when available ?I Assessed the need for Mobilization ?I made an Assessment of medications to be adjusted accordingly ?Safety Risk assessment completed ? ?Labs   ?  CBC: ?Recent Labs  ?Lab 09/19/21 ?1138 09/26/21 ?0437  ?WBC 8.2 17.5*  ?NEUTROABS 5.5 15.1*  ?HGB 13.7 14.0  ?HCT 42.0 43.1  ?MCV 86.4 86.7  ?PLT  284 336  ? ? ?Basic Metabolic Panel: ?Recent Labs  ?Lab 09/19/21 ?1138 09/26/21 ?0437 09/26/21 ?0810  ?NA 136 142  --   ?K 4.1 3.4*  --   ?CL 102 107  --   ?CO2 24 28  --   ?GLUCOSE 391* 125*  --   ?BUN 24* 22*  --   ?CREATININE 1.07* 1.48*  --   ?CALCIUM 7.8* 8.0*  --   ?MG  --   --  1.5*  ? ?GFR: ?Estimated Creatinine Clearance: 47.4 mL/min (A) (by C-G formula based on SCr of 1.48 mg/dL (H)). ?Recent Labs  ?Lab 09/19/21 ?1138 09/26/21 ?0437 09/26/21 ?0810  ?PROCALCITON  --   --  30.05  ?WBC 8.2 17.5*  --   ?LATICACIDVEN  --   --  2.2*  ? ? ?Liver Function Tests: ?Recent Labs  ?Lab 09/19/21 ?1138 09/26/21 ?0437  ?AST 15 24  ?ALT 10 11  ?ALKPHOS 103 104  ?BILITOT 0.4 0.5  ?PROT 5.1* 5.2*  ?ALBUMIN <1.5* <1.5*  ? ?Recent Labs  ?Lab 09/26/21 ?0211  ?LIPASE 31  ? ?No results for input(s): AMMONIA in the last 168 hours. ? ?ABG ?   ?Component Value Date/Time  ? PHART 7.10 (LL) 07/29/2019 0321  ? PCO2ART <19.0 (LL) 07/29/2019 0321  ? PO2ART 164 (H) 07/29/2019 0321  ? HCO3 21.3 09/26/2021 0912  ? ACIDBASEDEF 1.5 09/26/2021 0912  ? O2SAT 86.4 09/26/2021 0912  ?  ? ?Coagulation Profile: ?No results for input(s): INR, PROTIME in the last 168 hours. ? ?Cardiac Enzymes: ?No results for input(s): CKTOTAL, CKMB, CKMBINDEX, TROPONINI in the last 168 hours. ? ?HbA1C: ?Hgb A1c MFr Bld  ?Date/Time Value Ref Range Status  ?09/04/2021 01:06 PM 13.1 (H) 4.8 - 5.6 % Final  ?  Comment:  ?  (NOTE) ?**Verified by repeat analysis** ?        Prediabetes: 5.7 - 6.4 ?        Diabetes: >6.4 ?        Glycemic control for adults with diabetes: <7.0 ?  ?05/18/2021 07:18 AM 10.5 (H) 4.8 - 5.6 % Final  ?  Comment:  ?  (NOTE) ?Pre diabetes:          5.7%-6.4% ? ?Diabetes:              >6.4% ? ?Glycemic control for   <7.0% ?adults with diabetes ?  ? ? ?CBG: ?Recent Labs  ?Lab 09/26/21 ?1552  ?GLUCAP 95  ? ? ?Review of Systems:   ?Pertinent findings noted in HPI. All other systems reviewed and negative unless otherwise documented. ? ?Past Medical  History:  ?She,  has a past medical history of Diabetes mellitus without complication ([place]), Minimal change disease (08/23/2019), Nephritic syndrome, and Nephrotic syndrome (08/23/2019).  ? ?Surgical History:  ?History reviewed. No pertinent surgical history.  ? ?Social History:  ? reports that she has never smoked. She has never used smokeless tobacco. She reports that she does not currently use alcohol. She reports that she does not currently use drugs.  ? ?Family History:  ?Her family history is not on file.  ? ?Allergies ?No Known Allergies  ? ?Home Medications  ?Prior to Admission medications   ?Medication Sig Start Date End Date Taking? Authorizing Provider  ?atorvastatin (LIPITOR) 40 MG tablet Take 1 tablet (40  mg total) by mouth daily. 09/06/21 12/05/21 Yes Enzo Bi, MD  ?furosemide (LASIX) 20 MG tablet Take 1 tablet (20 mg total) by mouth daily for 12 days. 09/19/21 10/01/21 Yes Blake Divine, MD  ?glucagon 1 MG injection Inject 1 mg into the muscle as needed. 02/26/19  Yes [provider]  ?insulin aspart (NOVOLOG) 100 UNIT/ML injection Inject 5-8 Units into the skin 3 (three) times daily before meals. 09/06/21 12/05/21 Yes Enzo Bi, MD  ?LANTUS 100 UNIT/ML injection Inject 0.3 mLs (30 Units total) into the skin at bedtime. 09/06/21 12/05/21 Yes Enzo Bi, MD  ?ondansetron (ZOFRAN-ODT) 4 MG disintegrating tablet Take 1 tablet (4 mg total) by mouth every 8 (eight) hours as needed for nausea or vomiting. 09/26/21  Yes Alfred Levins, [place], MD  ?tacrolimus (PROGRAF) 1 MG capsule Take 2 mg by mouth 2 (two) times daily.   Yes [provider]  ?acetaminophen (TYLENOL) 325 MG tablet Take 325 mg by mouth every 6 (six) hours as needed. ?Patient not taking: Reported on 09/26/2021 01/14/17   [provider]  ?blood glucose meter kit and supplies Dispense based on patient and insurance preference. Use up to four times daily as directed. (FOR ICD-10 E10.9, E11.9). 09/06/21   Enzo Bi, MD  ?  ? ? ?Bennie Pierini, MD 09/26/21 10:18 AM   ?  ?

## 2021-09-26 NOTE — Progress Notes (Signed)
Chaplain Maggie made initial visit with family as pt was brought to ICU. Family gathered in waiting room. Spiritual support in the form of prayer was shared. Continued care available per on call chaplain. ?

## 2021-09-26 NOTE — ED Notes (Signed)
"  Currently being processed" per [facility], will send labs with chart labels. ?

## 2021-09-26 NOTE — ED Notes (Signed)
IU stated will place orders for vasopressors and ICU consult.  ?

## 2021-09-26 NOTE — ED Notes (Signed)
Patient to CT via stretcher. Second liter of fluid started as patient was leaving for CT. ?

## 2021-09-26 NOTE — ED Notes (Signed)
Patient ambulated to the bedside commode without assistance. While there she complained of nausea. This nurse notified the provider who placed orders. While administering med, patient requested a beverage. This nurse explained to the patient that if she is continuing to have nausea, then all beverages would be held. Patient verbalized understanding. ?

## 2021-09-26 NOTE — Progress Notes (Signed)
Order for USGPIV noted - went to ED patient has 3 IV's. Has order for vasopressor but MD holding on that for now. Patient will go to ICU - nurse can place order for IV if needed at that time for vasopressor. ?

## 2021-09-26 NOTE — ED Provider Notes (Signed)
I assumed care of this patient approximately 0 700.  Please see outgoing providers note for full details regarding patient's initial evaluation assessment.  In brief patient presents for evaluation of nausea vomiting abdominal pain in the setting of a history of type 1 diabetes and nephrotic syndrome. ? ?Initial labs remarkable for CBC with WBC count of 17.5 without evidence of acute anemia and normal platelets.  hCG is negative.  CMP shows K of 3.4 and an AKI with a creatinine of 1.48 compared to 1.077 days ago as well as chronic hypoalbuminemia.  No evidence of acute hepatitis or cholestatic process.  Lipase not suggestive of acute pancreatitis.  Overall picture is not suggestive of DKA.  Urine has some protein as well as 21-50 WBCs and some rare bacteria.  Plan is to reassess after patient received some IV fluids analgesia and antiemetics.  On reassessment patient still complaining of posterior neck abdominal pain.  She is still tachycardic and tachypneic after 2 L of fluid.  She is still.  Tender throughout her abdomen.  I am concerned for possible sepsis from abdominal source versus possible cystitis.  Patient is immunosuppressed on tacrolimus..  Code sepsis initiated.  Started on Zosyn.  Will admit to medicine service for further evaluation and management. ? ?.Critical Care ?Performed by: Gilles Chiquito, MD ?Authorized by: Gilles Chiquito, MD  ? ?Critical care provider statement:  ?  Critical care time (minutes):  30 ?  Critical care was necessary to treat or prevent imminent or life-threatening deterioration of the following conditions:  Sepsis and dehydration ?  Critical care was time spent personally by me on the following activities:  Development of treatment plan with patient or surrogate, discussions with consultants, evaluation of patient's response to treatment, examination of patient, ordering and review of laboratory studies, ordering and review of radiographic studies, ordering and performing  treatments and interventions, pulse oximetry, re-evaluation of patient's condition and review of old charts ?  I assumed direction of critical care for this patient from another provider in my specialty: yes   ?  Care discussed with: admitting provider   ? ?  ?Gilles Chiquito, MD ?09/26/21 [phone] ? ?

## 2021-09-26 NOTE — ED Notes (Signed)
Patient returned from CT and was started back on fluids. Patient also placed on monitoring equipment. Patient states she would like additional pain medication. Provider was notified and states medication will be address upon results of CT. Patient verbalized understanding. ?

## 2021-09-26 NOTE — Progress Notes (Signed)
Admission profile updated. ?

## 2021-09-27 ENCOUNTER — Inpatient Hospital Stay: Payer: Medicaid Other

## 2021-09-27 ENCOUNTER — Inpatient Hospital Stay
Admit: 2021-09-27 | Discharge: 2021-09-27 | Disposition: A | Discharge status: Home / Self Care | Payer: Medicaid Other | Attending: Critical Care Medicine | Admitting: Critical Care Medicine

## 2021-09-27 DIAGNOSIS — N179 Acute kidney failure, unspecified: Secondary | ICD-10-CM

## 2021-09-27 DIAGNOSIS — B955 Unspecified streptococcus as the cause of diseases classified elsewhere: Secondary | ICD-10-CM | POA: Diagnosis not present

## 2021-09-27 DIAGNOSIS — R1013 Epigastric pain: Secondary | ICD-10-CM

## 2021-09-27 DIAGNOSIS — R7881 Bacteremia: Secondary | ICD-10-CM

## 2021-09-27 DIAGNOSIS — R8271 Bacteriuria: Secondary | ICD-10-CM

## 2021-09-27 DIAGNOSIS — R1084 Generalized abdominal pain: Secondary | ICD-10-CM

## 2021-09-27 DIAGNOSIS — E1021 Type 1 diabetes mellitus with diabetic nephropathy: Secondary | ICD-10-CM

## 2021-09-27 DIAGNOSIS — J189 Pneumonia, unspecified organism: Secondary | ICD-10-CM

## 2021-09-27 LAB — ECHOCARDIOGRAM COMPLETE
AR max vel: 2.16 cm2
AV Area VTI: 2.27 cm2
AV Area mean vel: 2.08 cm2
AV Mean grad: 5 mmHg
AV Peak grad: 8.5 mmHg
Ao pk vel: 1.46 m/s
Area-P 1/2: 8.82 cm2
Height: 59 in
MV VTI: 1.99 cm2
S' Lateral: 2.33 cm
Weight: 2116.42 oz

## 2021-09-27 LAB — GASTROINTESTINAL PANEL BY PCR, STOOL (REPLACES STOOL CULTURE)

## 2021-09-27 LAB — LACTIC ACID, PLASMA: Lactic Acid, Venous: 2 mmol/L (ref 0.5–1.9)

## 2021-09-27 LAB — URINE CULTURE: Culture: 4000 — AB

## 2021-09-27 LAB — GLUCOSE, CAPILLARY
Glucose-Capillary: 71 mg/dL (ref 70–99)
Glucose-Capillary: 111 mg/dL — ABNORMAL HIGH (ref 70–99)
Glucose-Capillary: 229 mg/dL — ABNORMAL HIGH (ref 70–99)
Glucose-Capillary: 244 mg/dL — ABNORMAL HIGH (ref 70–99)
Glucose-Capillary: 134 mg/dL — ABNORMAL HIGH (ref 70–99)

## 2021-09-27 LAB — CBC
HCT: 33.5 % — ABNORMAL LOW (ref 36.0–46.0)
Hemoglobin: 10.9 g/dL — ABNORMAL LOW (ref 12.0–15.0)
MCH: 28.8 pg (ref 26.0–34.0)
MCHC: 32.5 g/dL (ref 30.0–36.0)
MCV: 88.6 fL (ref 80.0–100.0)
Platelets: 204 10*3/uL (ref 150–400)
RBC: 3.78 MIL/uL — ABNORMAL LOW (ref 3.87–5.11)
RDW: 14.4 % (ref 11.5–15.5)
WBC: 27.9 10*3/uL — ABNORMAL HIGH (ref 4.0–10.5)
nRBC: 0 % (ref 0.0–0.2)

## 2021-09-27 LAB — COMPREHENSIVE METABOLIC PANEL
ALT: 41 U/L (ref 0–44)
AST: 96 U/L — ABNORMAL HIGH (ref 15–41)
Albumin: 1.6 g/dL — ABNORMAL LOW (ref 3.5–5.0)
Alkaline Phosphatase: 95 U/L (ref 38–126)
Anion gap: 6 (ref 5–15)
BUN: 15 mg/dL (ref 6–20)
CO2: 22 mmol/L (ref 22–32)
Calcium: 7.4 mg/dL — ABNORMAL LOW (ref 8.9–10.3)
Chloride: 111 mmol/L (ref 98–111)
Creatinine, Ser: 1.3 mg/dL — ABNORMAL HIGH (ref 0.44–1.00)
GFR, Estimated: 60 mL/min — ABNORMAL LOW (ref >60)
Glucose, Bld: 67 mg/dL — ABNORMAL LOW (ref 70–99)
Potassium: 4 mmol/L (ref 3.5–5.1)
Sodium: 139 mmol/L (ref 135–145)
Total Bilirubin: 0.6 mg/dL (ref 0.3–1.2)
Total Protein: 4.9 g/dL — ABNORMAL LOW (ref 6.5–8.1)

## 2021-09-27 LAB — C DIFFICILE QUICK SCREEN W PCR REFLEX
C Diff antigen: NEGATIVE
C Diff interpretation: NOT DETECTED
C Diff toxin: NEGATIVE

## 2021-09-27 LAB — LEGIONELLA PNEUMOPHILA SEROGP 1 UR AG: L. pneumophila Serogp 1 Ur Ag: NEGATIVE

## 2021-09-27 LAB — PROCALCITONIN: Procalcitonin: 65.93 ng/mL

## 2021-09-27 LAB — MAGNESIUM: Magnesium: 2.2 mg/dL (ref 1.7–2.4)

## 2021-09-27 LAB — PROTEIN / CREATININE RATIO, URINE
Creatinine, Urine: 30 mg/dL
Protein Creatinine Ratio: 25.93 mg/mg{Cre} — ABNORMAL HIGH (ref 0.00–0.15)
Total Protein, Urine: 778 mg/dL

## 2021-09-27 LAB — PHOSPHORUS: Phosphorus: 4 mg/dL (ref 2.5–4.6)

## 2021-09-27 MED ORDER — INSULIN GLARGINE-YFGN 100 UNIT/ML ~~LOC~~ SOLN
12.0000 [IU] | Freq: Two times a day (BID) | SUBCUTANEOUS | Status: DC
  Administered 2021-09-27 – 2021-09-28 (×2): 12 [IU] via SUBCUTANEOUS
  Filled 2021-09-27 (×4): qty 0.12

## 2021-09-27 MED ORDER — SODIUM CHLORIDE 0.9 % IV SOLN
3.0000 g | Freq: Four times a day (QID) | INTRAVENOUS | Status: DC
  Administered 2021-09-28 – 2021-09-30 (×10): 3 g via INTRAVENOUS
  Filled 2021-09-27 (×3): qty 3
  Filled 2021-09-27: qty 8
  Filled 2021-09-27 (×6): qty 3
  Filled 2021-09-27: qty 8

## 2021-09-27 MED ORDER — INSULIN ASPART 100 UNIT/ML IJ SOLN
0.0000 [IU] | Freq: Three times a day (TID) | INTRAMUSCULAR | Status: DC
  Administered 2021-09-27 – 2021-09-28 (×3): 3 [IU] via SUBCUTANEOUS
  Administered 2021-09-28: 5 [IU] via SUBCUTANEOUS
  Administered 2021-09-28: 2 [IU] via SUBCUTANEOUS
  Filled 2021-09-27 (×5): qty 1

## 2021-09-27 MED ORDER — INSULIN ASPART 100 UNIT/ML IJ SOLN
0.0000 [IU] | Freq: Every day | INTRAMUSCULAR | Status: DC
  Administered 2021-09-29: 3 [IU] via SUBCUTANEOUS
  Filled 2021-09-27: qty 1

## 2021-09-27 MED ORDER — SODIUM CHLORIDE 0.9 % IV SOLN
500.0000 mg | INTRAVENOUS | Status: DC
  Administered 2021-09-27 – 2021-09-29 (×3): 500 mg via INTRAVENOUS
  Filled 2021-09-27 (×3): qty 500

## 2021-09-27 MED ORDER — INSULIN ASPART 100 UNIT/ML IJ SOLN
3.0000 [IU] | Freq: Three times a day (TID) | INTRAMUSCULAR | Status: DC
  Administered 2021-09-27 – 2021-09-28 (×3): 3 [IU] via SUBCUTANEOUS
  Filled 2021-09-27 (×3): qty 1

## 2021-09-27 MED ORDER — METOCLOPRAMIDE HCL 5 MG/ML IJ SOLN: 5.0000 mg | Freq: Three times a day (TID) | INTRAMUSCULAR | Status: DC | PRN

## 2021-09-27 NOTE — Assessment & Plan Note (Signed)
Resolved with repletion. -Continue to monitor and replete as needed 

## 2021-09-27 NOTE — Progress Notes (Signed)
Patient alert and oriented x4, sinus tach, on room air. Patient denies pain, is able to reposition self in bed. Patient ambulates to bedside commode with stand by assist, tolerates well. Denies pain. Patient education provided for incentive spirometer and flutter valve. Patient encouraged to use both devices. Patient refused incentive spirometer. Patient currently sitting in chair at bedside eating dinner.  ?

## 2021-09-27 NOTE — Assessment & Plan Note (Addendum)
Poorly controlled diabetes with A1c of 13.1 and hyperglycemia. ?CBG elevated. ?Patient was on Lantus 30 units daily along with NovoLog. ?-Make glargine 12 units twice daily. ?-Add 3 units for mealtime coverage ?-Continue with sliding scale ?

## 2021-09-27 NOTE — Assessment & Plan Note (Signed)
Resolved. ?No more nausea or vomiting today. ?Did develop some diarrhea-GI pathogen panel sent. ?General surgery was also consulted but there is no concern of acute abdomen as CT abdomen was without any acute findings. ?-Continue to monitor ?-Supportive care ?

## 2021-09-27 NOTE — Hospital Course (Addendum)
Taken from prior notes. ? ?Catherine [name] is a 21 y.o. female with medical history significant of type 1 diabetes, hypertension, hyperlipidemia, nephrotic syndrome on tacrolimus, who presents with abdominal pain. ?  ?Patient states that her symptoms started in the early morning at about 1 AM.  Patient has nausea, vomiting and abdominal pain.  She has vomited more than 10 times with nonbilious nonbloody vomiting.  No diarrhea.  Her abdominal pain is diffuse, severe, sharp, nonradiating.  Patient denies subjective fever, but she has temperature 102.2 in ED.  No chills. Patient reports shortness of breath, no cough, chest pain.  She had 1 episode of oxygen desaturating to 87% on room air, which improved to 98-100% on room air later on.  Denies symptoms of UTI.  Patient is lethargic, but easily arousable, oriented x3.  Moves all extremities normally. ? ?Patient was initially hypotensive with blood pressure 86/57, which improved to 103/62 after giving 2 L LR bolus, but dropped to 77/50, then back to 94/51 after given 3L of LR. Levophed is ordered if MAP < 65 again, but it was never required and blood pressure remained within goal after that.. ? ?CT abdomen was negative for any acute intra-abdominal findings.  General surgery was also consulted and they signed off as there was no concern of acute abdomen. ?Chest x-ray concerning for patchy bilateral pneumonia.  She was having cough for 3 days. ?MRSA PCR negative. ? ?Patient was initially started on broad-spectrum antibiotics due to concern of immunocompromised with tacrolimus, she received Zosyn and vancomycin.  Later blood culture came back positive for Streptococcus agalactia, vancomycin was discontinued. ?Echocardiogram ordered-pending ?ID, nephrology and PCCM was also consulted. ?PCCM also signed off as patient is now stable and not requiring any pressors. ? ?Patient developed diarrhea today, GI pathogen panel negative. ?Respiratory viral panel  negative. ?Zithromax was also added for CAP coverage. ? ?Echocardiogram was normal, no vegetations.  No need for TEE per ID. ? ?Clinically seems improving.  Zosyn was switched with Unasyn by ID. ? ?Renal duplex scan done today by nephrology was negative. ? ?3/30: Clinically stable, leukocytosis improving.  Home dose of tacrolimus was held at ID request due to significant leukocytosis.  ID would like to normalize leukocytosis before repeating blood cultures.  TTE was normal, no need for TEE per ID. ?Home Lasix was switched with torsemide by nephrology today. ? ?

## 2021-09-27 NOTE — Progress Notes (Signed)
[facility] ?[place], [place] ?09/27/21 ? ?Subjective:  ? ?Hospital day # 1 ?Patient presented to the emergency room for mid abdominal pain, radiating to her back and vomiting.  She is currently admitted to ICU for hypotension and sepsis.  Requiring oxygen by nasal cannula.  Initially blood pressure was in the 80s upon arrival.  Was given IV fluid boluses. ?WBC count is elevated.  Chest x-ray showed airspace disease in the right lung suggesting possible pneumonia. ?Patient is currently being treated with broad-spectrum antibiotics, bronchodilators and oxygen supplementation. ?Patient's mother is at bedside ?States that patient was staying by herself and mother is not sure whether she is taking all her medications appropriately.  Patient states that she is compliant with her insulin and tacrolimus. ?In the past, [facility] for minimal-change disease and she was steroid responsive. ?Mother states that usually she gets high-dose steroids initially and then taper for total course of about 6 to 8 weeks. ? ? ?Renal: ?03/27 0701 - 03/28 0700 ?In: 6470.8 [P.O.:360; I.V.:2953.5; IV Piggyback:3157.3] ?Out: 2425 [Urine:2225; Stool:200] ?Lab Results  ?Component Value Date  ? CREATININE 1.30 (H) 09/27/2021  ? CREATININE 1.48 (H) 09/26/2021  ? CREATININE 1.07 (H) 09/19/2021  ? ? ? ?Objective:  ?Vital signs in last 24 hours:  ?Temp:  [97.8 ?F (36.6 ?C)-101.5 ?F (38.6 ?C)] 99.5 ?F (37.5 ?C) (03/28 0800) ?Pulse Rate:  [98-125] 113 (03/28 0800) ?Resp:  [23-41] 30 (03/28 0800) ?BP: (93-127)/(54-84) 120/84 (03/28 0800) ?SpO2:  [93 %-100 %] 100 % (03/28 0800) ? ?Weight change:  ?Filed Weights  ? 09/26/21 0409  ?Weight: 60 kg  ? ? ?Intake/Output: ?  ? ?Intake/Output Summary (Last 24 hours) at 09/27/2021 1155 ?Last data filed at 09/27/2021 0945 ?Gross per 24 hour  ?Intake 4210.18 ml  ?Output 2855 ml  ?Net 1355.18 ml  ? ? ? ?Physical Exam: ?General: Ill-appearing, laying in the bed   ?HEENT [place] O2, moist oral mucous membranes  ?Pulm/lungs Coarse breath sounds  ?CVS/Heart Tachycardic  ?Abdomen:  Soft  ?Extremities: + Dependent peripheral edema up to thighs  ?Neurologic: Lethargic but able to answer simple yes/no questions  ?Skin: Warm, dry  ?   ?   ? ? ?Basic Metabolic Panel: ? ?Recent Labs  ?Lab 09/26/21 ?0437 09/26/21 ?0810 09/27/21 ?0404  ?NA 142  --  139  ?K 3.4*  --  4.0  ?CL 107  --  111  ?CO2 28  --  22  ?GLUCOSE 125*  --  67*  ?BUN 22*  --  15  ?CREATININE 1.48*  --  1.30*  ?CALCIUM 8.0*  --  7.4*  ?MG  --  1.5* 2.2  ?PHOS  --   --  4.0  ? ? ? ?CBC: ?Recent Labs  ?Lab 09/26/21 ?0437 09/27/21 ?0404  ?WBC 17.5* 27.9*  ?NEUTROABS 15.1*  --   ?HGB 14.0 10.9*  ?HCT 43.1 33.5*  ?MCV 86.7 88.6  ?PLT 336 204  ? ? ?  ?Lab Results  ?Component Value Date  ? HEPBSAG NON REACTIVE 09/04/2021  ? HEPBIGM NON REACTIVE 09/04/2021  ? ? ? ? ?Microbiology: ? ?Recent Results (from the past 240 hour(s))  ?Urine Culture     Status: Abnormal  ? Collection Time: 09/26/21  5:18 AM  ? Specimen: Urine, Clean Catch  ?Result Value Ref Range Status  ? Specimen Description URINE, CLEAN CATCH  Final  ? Special Requests NONE  Final  ? Culture (A)  Final  ?  4,000 COLONIES/mL  GROUP B STREP(S.AGALACTIAE)ISOLATED ?TESTING AGAINST S. AGALACTIAE NOT ROUTINELY PERFORMED DUE TO PREDICTABILITY OF AMP/PEN/VAN SUSCEPTIBILITY. ?  ? Report Status 09/27/2021 FINAL  Final  ?Blood Culture (routine x 2)     Status: Abnormal (Preliminary result)  ? Collection Time: 09/26/21  8:10 AM  ? Specimen: BLOOD  ?Result Value Ref Range Status  ? Specimen Description   Final  ?  BLOOD LEFT ANTECUBITAL ?Performed at [facility], [address] [place], [place] [address] ?  ? Special Requests   Final  ?  BOTTLES DRAWN AEROBIC AND ANAEROBIC Blood Culture results may not be optimal due to an excessive volume of blood received in culture bottles ?Performed at [facility], [address] [place], [place] [address] ?  ? Culture   Setup Time   Final  ?  GRAM POSITIVE COCCI ?ANAEROBIC BOTTLE ONLY ?Organism ID to follow ?CRITICAL RESULT CALLED TO, READ BACK BY AND VERIFIED WITH: ALEX CHAPPELL 2001 09/26/21 MU ?  ? Culture (A)  Final  ?  GROUP B STREP(S.AGALACTIAE)ISOLATED ?SUSCEPTIBILITIES TO FOLLOW ?Performed at [facility], [address], [place], [place] [address] ?  ? Report Status PENDING  Incomplete  ?Blood Culture ID Panel (Reflexed)     Status: Abnormal  ? Collection Time: 09/26/21  8:10 AM  ?Result Value Ref Range Status  ? Enterococcus faecalis NOT DETECTED NOT DETECTED Final  ? Enterococcus Faecium NOT DETECTED NOT DETECTED Final  ? Listeria monocytogenes NOT DETECTED NOT DETECTED Final  ? Staphylococcus species NOT DETECTED NOT DETECTED Final  ? Staphylococcus aureus (BCID) NOT DETECTED NOT DETECTED Final  ? Staphylococcus epidermidis NOT DETECTED NOT DETECTED Final  ? Staphylococcus lugdunensis NOT DETECTED NOT DETECTED Final  ? Streptococcus species DETECTED (A) NOT DETECTED Final  ?  Comment: CRITICAL RESULT CALLED TO, READ BACK BY AND VERIFIED WITH: ?ALEC CHAPPELL 2001 09/26/21 MU ?  ? Streptococcus agalactiae DETECTED (A) NOT DETECTED Final  ?  Comment: CRITICAL RESULT CALLED TO, READ BACK BY AND VERIFIED WITH: ?ALEX CHAPPELL 2001 09/26/21 MU ?  ? Streptococcus pneumoniae NOT DETECTED NOT DETECTED Final  ? Streptococcus pyogenes NOT DETECTED NOT DETECTED Final  ? A.calcoaceticus-baumannii NOT DETECTED NOT DETECTED Final  ? Bacteroides fragilis NOT DETECTED NOT DETECTED Final  ? Enterobacterales NOT DETECTED NOT DETECTED Final  ? Enterobacter cloacae complex NOT DETECTED NOT DETECTED Final  ? Escherichia coli NOT DETECTED NOT DETECTED Final  ? Klebsiella aerogenes NOT DETECTED NOT DETECTED Final  ? Klebsiella oxytoca NOT DETECTED NOT DETECTED Final  ? Klebsiella pneumoniae NOT DETECTED NOT DETECTED Final  ? Proteus species NOT DETECTED NOT DETECTED Final  ? Salmonella species NOT DETECTED NOT DETECTED Final  ? Serratia  marcescens NOT DETECTED NOT DETECTED Final  ? Haemophilus influenzae NOT DETECTED NOT DETECTED Final  ? Neisseria meningitidis NOT DETECTED NOT DETECTED Final  ? Pseudomonas aeruginosa NOT DETECTED NOT DETECTED Final  ? Stenotrophomonas maltophilia NOT DETECTED NOT DETECTED Final  ? Candida albicans NOT DETECTED NOT DETECTED Final  ? Candida auris NOT DETECTED NOT DETECTED Final  ? Candida glabrata NOT DETECTED NOT DETECTED Final  ? Candida krusei NOT DETECTED NOT DETECTED Final  ? Candida parapsilosis NOT DETECTED NOT DETECTED Final  ? Candida tropicalis NOT DETECTED NOT DETECTED Final  ? Cryptococcus neoformans/gattii NOT DETECTED NOT DETECTED Final  ?  Comment: Performed at [facility], [address], [place], [place] [address]  ?Blood Culture (routine x 2)     Status: None (Preliminary result)  ?  Collection Time: 09/26/21  8:11 AM  ? Specimen: BLOOD  ?Result Value Ref Range Status  ? Specimen Description BLOOD BLOOD LEFT WRIST  Final  ? Special Requests   Final  ?  BOTTLES DRAWN AEROBIC AND ANAEROBIC Blood Culture adequate volume  ? Culture   Final  ?  NO GROWTH < 24 HOURS ?Performed at [facility], [address], [place], [place] [address] ?  ? Report Status PENDING  Incomplete  ?Resp Panel by RT-PCR (Flu A&B, Covid) Nasopharyngeal Swab     Status: None  ? Collection Time: 09/26/21  8:11 AM  ? Specimen: Nasopharyngeal Swab; Nasopharyngeal(NP) swabs in vial transport medium  ?Result Value Ref Range Status  ? SARS Coronavirus 2 by RT PCR NEGATIVE NEGATIVE Final  ?  Comment: (NOTE) ?SARS-CoV-2 target nucleic acids are NOT DETECTED. ? ?The SARS-CoV-2 RNA is generally detectable in upper respiratory ?specimens during the acute phase of infection. The lowest ?concentration of SARS-CoV-2 viral copies this assay can detect is ?138 copies/mL. A negative result does not preclude SARS-Cov-2 ?infection and should not be used as the sole basis for treatment or ?other patient management decisions. A  negative result may occur with  ?improper specimen collection/handling, submission of specimen other ?than nasopharyngeal swab, presence of viral mutation(s) within the ?areas targeted by this assay, and inadequa

## 2021-09-27 NOTE — Assessment & Plan Note (Signed)
Patient met severe sepsis with septic shock criteria with fever, tachycardia, tachypnea, lactic acidosis and AKI.  She was hypotensive and required 3 IV boluses, levo was ordered but never required. ?PCCM was also consulted, now the signed off as she is currently stable. ?Blood cultures growing strep agalactiae, concern of GI or GU source. ?Urine and respiratory cultures pending. ?Chest x-ray concerning for pneumonia-on repeat there is some worsening. ?MRSA PCR negative. ?Procalcitonin continue to rise, at 65.95 today. ?Echocardiogram ordered-pending ?GI pathogen ordered pending ?ID consult pending. ?-Continue with Zosyn ?-Add Zithromax ?-Current continue with supportive care. ?

## 2021-09-27 NOTE — Assessment & Plan Note (Addendum)
Chest x-ray concerning for right middle lower lobe pneumonia on admission, repeat x-ray done this morning with bilateral worsening opacities concerning for pneumonia.  MRSA PCR negative.  Strep pneumo antigen negative, Legionella pending. ?-Continue with Zosyn ?-Add Zithromax ?

## 2021-09-27 NOTE — Progress Notes (Signed)
? ?NAME:  Catherine [name], MRN:  [id number], DOB:  [birth year], LOS: 1 ?ADMISSION DATE:  09/26/2021, CONSULTATION DATE:  09/27/21  ?REFERRING MD:  Dr. Clyde LundborgNiu, CHIEF COMPLAINT:  nausea, vomiting  ? ?History of Present Illness:  ?[name] is a 21 y.o. female admitted with fever, nausea, vomiting and back pain. 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 She endorses adherence with Prograf and Lasix. Denies sick contacts. No respiratory symptoms. No diarrhea. No recent travel. ? ?In the ED, she was tachypneic, tachycardic and hypotensive. She responded appropriately to volume resuscitation. Labs notable for leukocytosis with left shift, mild AKI with creatinine 1.48 (from baseline 1.0-1.1), lipase 31, lactate 2.2, procalcitonin 30.05. VBG showed pH 7.46, pCO2 30. U/A not indicative of infection. Salicylate level negative.  ? ?CXR shows new right sided consolidative opacities concerning for CAP. CT a/p w/o contrast showed no acute intraabdominal findings, lower chest revealed mild atelectasis and questionable early bibasilar fibrotic changes. ? ?Pertinent  Medical History  ?Minimal change disease with nephrotic syndrome ?Chronic immunosuppression ?Type 1 diabetes mellitus ? ?Significant Hospital Events: ?Including procedures, antibiotic start and stop dates in addition to other pertinent events   ?3/26: admitted to hospital ?3/27: PCCM consulted ?3/28: Nephrology and ID consulted to assist with management. Blood cultures positive for group B strep   ? ?Interim History / Subjective:  ?Lactic acid improved following iv fluid resuscitation.  No acute events overnight.  Pt states abdominal pain has resolved.  She does endorse continued shortness of breath ? ?Objective   ?Blood pressure 120/84, pulse (!) 113, temperature 99.5 ?F (37.5 ?C), temperature source Oral, resp. rate (!) 30, height 4\' 11"  (1.499 m), weight 60 kg, SpO2 100 %. ?   ?   ? ?Intake/Output  Summary (Last 24 hours) at 09/27/2021 0854 ?Last data filed at 09/27/2021 0700 ?Gross per 24 hour  ?Intake 5470.77 ml  ?Output 2325 ml  ?Net 3145.77 ml  ? ?Filed Weights  ? 09/26/21 0409  ?Weight: 60 kg  ? ? ?Examination: ?General: Acutely ill appearing female with mild tachypnea with exertion  ?HENT: supple, no JVD  ?Lungs: Mild tachypnea with exertion, faint crackles throughout, non labored ?Cardiovascular: NSR, rrr, no M/R/G, 2+ radial/2+ distal pulses, 2+ generalized edema  ?Abdomen: +BS x4, non tender, mild distension ?Extremities: Moves all extremities, normal bulk and tone  ?Neuro: A/Ox3, no focal deficits ?[facility] ? ?Resolved Hospital Problem list   ?Fluid responsive hypotension  ? ?Assessment & Plan:  ?Severe sepsis 2/2 CAP in immunosuppressed patient ?  Blood cultures positive 1/4 bottles GPC. BCID detected Streptococcus agalactiae ?- Trend WBC and monitor fever curve  ?- Trend PCT ?- Follow cultures ?- Continue zosyn and vancomycin ?- ID consulted appreciate input  ?- Maintain map >65, continue to hold outpatient antihypertensives  ? ?Acute respiratory failure secondary to CAP  ?- Supplemental O2 for dyspnea and/or hypoxia  ?- Continue abx coverage as outline above ?- Prn bronchodilator therapy  ?- Aggressive pulmonary hygiene ? ?AKI ?Minimal change disease with nephrotic syndrome ?- Trend BMP  ?- Replace electrolytes as indicated  ?- Monitor UOP  ?- Avoid nephrotoxic medications when able  ?- Nephrology consulted appreciate input~per recommendations continue to hold Prograf due to current infection  ? ?Nausea/Vomiting/Abdominal Pain~improving  ?  CT Abd Pelvis 03/27 revealed no definite cause for symptoms but revealed body wall edema and trace ascites, presumably third-spacing and prominent density of the liver ?- General Surgery  consulted no findings of acute abdomen recommended if pt deteriorates obtain CT Abd Pelvis with Contrast  ? ?Uncontrolled Type 1 diabetes mellitus ?  Hemoglobin A1C  09/04/21: 13.1 ?- CBG's ac/hs ?- SSI  ?- Diabetes coordinator consulted appreciate input  ? ?Best Practice (right click and "Reselect all SmartList Selections" daily)  ? ?Diet/type: Carb modified  ?DVT prophylaxis: prophylactic heparin  ?GI prophylaxis: N/A ?Lines: N/A ?Foley:  N/A ?Code Status:  full code ?Last date of multidisciplinary goals of care discussion [09/27/21] ? ?Labs   ?CBC: ?Recent Labs  ?Lab 09/26/21 ?0437 09/27/21 ?0404  ?WBC 17.5* 27.9*  ?NEUTROABS 15.1*  --   ?HGB 14.0 10.9*  ?HCT 43.1 33.5*  ?MCV 86.7 88.6  ?PLT 336 204  ? ? ?Basic Metabolic Panel: ?Recent Labs  ?Lab 09/26/21 ?0437 09/26/21 ?0810 09/27/21 ?0404  ?NA 142  --  139  ?K 3.4*  --  4.0  ?CL 107  --  111  ?CO2 28  --  22  ?GLUCOSE 125*  --  67*  ?BUN 22*  --  15  ?CREATININE 1.48*  --  1.30*  ?CALCIUM 8.0*  --  7.4*  ?MG  --  1.5* 2.2  ?PHOS  --   --  4.0  ? ?GFR: ?Estimated Creatinine Clearance: 53.9 mL/min (A) (by C-G formula based on SCr of 1.3 mg/dL (H)). ?Recent Labs  ?Lab 09/26/21 ?0437 09/26/21 ?0810 09/26/21 ?0815 09/26/21 ?1336 09/26/21 ?1810 09/26/21 ?2117 09/27/21 ?0404  ?PROCALCITON  --  30.05  --   --   --   --  65.93  ?WBC 17.5*  --   --   --   --   --  27.9*  ?LATICACIDVEN  --  2.2*   < > 3.1* 4.3* 4.4* 2.0*  ? < > = values in this interval not displayed.  ? ? ?Liver Function Tests: ?Recent Labs  ?Lab 09/26/21 ?0437 09/27/21 ?0404  ?AST 24 96*  ?ALT 11 41  ?ALKPHOS 104 95  ?BILITOT 0.5 0.6  ?PROT 5.2* 4.9*  ?ALBUMIN <1.5* 1.6*  ? ?Recent Labs  ?Lab 09/26/21 ?0437  ?LIPASE 31  ? ?No results for input(s): AMMONIA in the last 168 hours. ? ?ABG ?   ?Component Value Date/Time  ? PHART 7.10 (LL) 07/29/2019 0321  ? PCO2ART <19.0 (LL) 07/29/2019 0321  ? PO2ART 164 (H) 07/29/2019 0321  ? HCO3 21.3 09/26/2021 0912  ? ACIDBASEDEF 1.5 09/26/2021 0912  ? O2SAT 86.4 09/26/2021 0912  ?  ? ?Coagulation Profile: ?Recent Labs  ?Lab 09/26/21 ?1054  ?INR 1.2  ? ? ?Cardiac Enzymes: ?No results for input(s): CKTOTAL, CKMB, CKMBINDEX, TROPONINI  in the last 168 hours. ? ?HbA1C: ?Hgb A1c MFr Bld  ?Date/Time Value Ref Range Status  ?09/04/2021 01:06 PM 13.1 (H) 4.8 - 5.6 % Final  ?  Comment:  ?  (NOTE) ?**Verified by repeat analysis** ?        Prediabetes: 5.7 - 6.4 ?        Diabetes: >6.4 ?        Glycemic control for adults with diabetes: <7.0 ?  ?05/18/2021 07:18 AM 10.5 (H) 4.8 - 5.6 % Final  ?  Comment:  ?  (NOTE) ?Pre diabetes:          5.7%-6.4% ? ?Diabetes:              >6.4% ? ?Glycemic control for   <7.0% ?adults with diabetes ?  ? ? ?CBG: ?Recent Labs  ?Lab 09/26/21 ?1531 09/26/21 ?1922 09/26/21 ?  2317 09/27/21 ?3244 09/27/21 ?0102  ?GLUCAP 75 173* 104* 71 111*  ? ? ?Review of Systems:   ?Pertinent findings noted in HPI. All other systems reviewed and negative unless otherwise documented. ? ?Past Medical History:  ?She,  has a past medical history of Diabetes mellitus without complication (HCC), Minimal change disease (08/23/2019), Nephritic syndrome, and Nephrotic syndrome (08/23/2019).  ? ?Surgical History:  ?History reviewed. No pertinent surgical history.  ? ?Social History:  ? reports that she has never smoked. She has never used smokeless tobacco. She reports that she does not currently use alcohol. She reports that she does not currently use drugs.  ? ?Family History:  ?Her family history is not on file.  ? ?Allergies ?No Known Allergies  ? ?Home Medications  ?Prior to Admission medications   ?Medication Sig Start Date End Date Taking? Authorizing Provider  ?atorvastatin (LIPITOR) 40 MG tablet Take 1 tablet (40 mg total) by mouth daily. 09/06/21 12/05/21 Yes Darlin Priestly, MD  ?furosemide (LASIX) 20 MG tablet Take 1 tablet (20 mg total) by mouth daily for 12 days. 09/19/21 10/01/21 Yes Chesley Noon, MD  ?glucagon 1 MG injection Inject 1 mg into the muscle as needed. 02/26/19  Yes [provider]  ?insulin aspart (NOVOLOG) 100 UNIT/ML injection Inject 5-8 Units into the skin 3 (three) times daily before meals. 09/06/21 12/05/21 Yes Darlin Priestly, MD   ?LANTUS 100 UNIT/ML injection Inject 0.3 mLs (30 Units total) into the skin at bedtime. 09/06/21 12/05/21 Yes Darlin Priestly, MD  ?ondansetron (ZOFRAN-ODT) 4 MG disintegrating tablet Take 1 tablet (4 mg total) by mouth ev

## 2021-09-27 NOTE — TOC Initial Note (Signed)
Transition of Care (TOC) - Initial/Assessment Note  ? ? ?Patient Details  ?Name: Catherine [name] ?MRN: [id number] ?Date of Birth: [birth year] ? ?Transition of Care (TOC) CM/SW Contact:    ?Shelbie Hutching, RN ?Phone Number: ?09/27/2021, 4:09 PM ? ?Clinical Narrative:                 ?Lonn Georgia from [facility] called and said to call if patient should have any discharge needs- [phone]. ? ? ?Transition of Care (TOC) Screening Note ? ? ?Patient Details  ?Name: Catherine [name] ?Date of Birth: Oct 03, 2000 ? ? ?Transition of Care (TOC) CM/SW Contact:    ?Shelbie Hutching, RN ?Phone Number: ?09/27/2021, 4:10 PM ? ? ? ?Transition of Care Department [facility]) has reviewed patient and no TOC needs have been identified at this time. We will continue to monitor patient advancement through interdisciplinary progression rounds. If new patient transition needs arise, please place a TOC consult. ?  ? ?Expected Discharge Plan: Home/Self Care ?Barriers to Discharge: Continued Medical Work up ? ? ?Patient Goals and CMS Choice ?  ?  ?  ? ?Expected Discharge Plan and Services ?Expected Discharge Plan: Home/Self Care ?  ?  ?  ?  ?                ?  ?  ?  ?  ?  ?  ?  ?  ?  ?  ? ?Prior Living Arrangements/Services ?  ?  ?  ?       ?  ?  ?  ?  ? ?Activities of Daily Living ?Home Assistive Devices/Equipment: CBG Meter ?ADL Screening (condition at time of admission) ?Patient's cognitive ability adequate to safely complete daily activities?: No ?Is the patient deaf or have difficulty hearing?: No ?Does the patient have difficulty seeing, even when wearing glasses/contacts?: No ?Does the patient have difficulty concentrating, remembering, or making decisions?: No ?Patient able to express need for assistance with ADLs?: Yes ?Does the patient have difficulty dressing or bathing?: No ?Independently performs ADLs?: Yes (appropriate for developmental age) ?Does the patient have difficulty walking or climbing stairs?: No ?Weakness of Legs:  None ?Weakness of Arms/Hands: None ? ?Permission Sought/Granted ?  ?  ?   ?   ?   ?   ? ?Emotional Assessment ?  ?  ?  ?  ?  ?  ? ?Admission diagnosis:  Hypokalemia [E87.6] ?Hypoalbuminemia [E88.09] ?Generalized abdominal pain [R10.84] ?SIRS (systemic inflammatory response syndrome) (HCC) [R65.10] ?AKI (acute kidney injury) ([place]) [N17.9] ?Abdominal pain [R10.9] ?Sepsis, due to unspecified organism, unspecified whether acute organ dysfunction present ([place]) [A41.9] ?Nausea and vomiting, unspecified vomiting type [R11.2] ?Patient Active Problem List  ? Diagnosis Date Noted  ? Abdominal pain 09/26/2021  ? Hypokalemia 09/26/2021  ? HLD (hyperlipidemia) 09/26/2021  ? HCAP (healthcare-associated pneumonia) 09/26/2021  ? Severe sepsis with septic shock (CODE) ([place]) 09/26/2021  ? Hypomagnesemia 09/26/2021  ? Pseudohyponatremia 09/04/2021  ? HTN (hypertension) 02/10/2021  ? DKA (diabetic ketoacidosis) ([place]) 04/18/2020  ? 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 Proteinuria 08/23/2019  ? DM (diabetes mellitus) type I uncontrolled with renal manifestation 08/23/2019  ? AKI (acute kidney injury) ([place]) 08/21/2019  ? 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 Hyponatremia   ? Acidosis   ? Type 1 diabetes mellitus with hyperglycemia ([place]) 10/12/2005  ? ?PCP:  [facility] [employer] ?  Pharmacy:   ?[facility] [place] [facility] ?[place] ?[place] [address] ?Phone: [phone] Fax: [phone] ? ? ? ? ?Social Determinants of Health (SDOH) Interventions ?  ? ?Readmission Risk Interventions ? ?  09/27/2021  ?  4:09 PM  ?Readmission Risk Prevention Plan  ?Transportation Screening Complete  ?PCP or Specialist Appt within 3-5 Days Complete  ?[place] or Home Care Consult Complete  ?Social Work Consult for [place] Planning/Counseling Complete  ?Palliative  Care Screening Not Applicable  ?Medication Review Press photographer) Referral to Pharmacy  ? ? ? ?

## 2021-09-27 NOTE — Assessment & Plan Note (Signed)
Nephrology was consulted. ?-Nephrology advised to hold tacrolimus until infection is clear. ?-Per nephrology she might get benefit from a repeat kidney biopsy to see if it is minimal-change or diabetic nephropathy as management will be different. ?-Patient follow-up with pediatric nephrology at [facility]. ?

## 2021-09-27 NOTE — Assessment & Plan Note (Signed)
Resolved after repletion. ?-Monitor potassium and replete as needed ?

## 2021-09-27 NOTE — Progress Notes (Addendum)
?Progress Note ? ? ?Patient: Catherine [name] [id number] DOB: 2000-10-11 DOA: 09/26/2021     1 ?DOS: the patient was seen and examined on 09/27/2021 ?  ?Brief hospital course: ?Taken from prior notes. ? ?Catherine [name] is a 21 y.o. female with medical history significant of type 1 diabetes, hypertension, hyperlipidemia, nephrotic syndrome on tacrolimus, who presents with abdominal pain. ?  ?Patient states that her symptoms started in the early morning at about 1 AM.  Patient has nausea, vomiting and abdominal pain.  She has vomited more than 10 times with nonbilious nonbloody vomiting.  No diarrhea.  Her abdominal pain is diffuse, severe, sharp, nonradiating.  Patient denies subjective fever, but she has temperature 102.2 in ED.  No chills. Patient reports shortness of breath, no cough, chest pain.  She had 1 episode of oxygen desaturating to 87% on room air, which improved to 98-100% on room air later on.  Denies symptoms of UTI.  Patient is lethargic, but easily arousable, oriented x3.  Moves all extremities normally. ? ?Patient was initially hypotensive with blood pressure 86/57, which improved to 103/62 after giving 2 L LR bolus, but dropped to 77/50, then back to 94/51 after given 3L of LR. Levophed is ordered if MAP < 65 again, but it was never required and blood pressure remained within goal after that.. ? ?CT abdomen was negative for any acute intra-abdominal findings.  General surgery was also consulted and they signed off as there was no concern of acute abdomen. ?Chest x-ray concerning for patchy bilateral pneumonia.  She was having cough for 3 days. ?MRSA PCR negative. ? ?Patient was initially started on broad-spectrum antibiotics due to concern of immunocompromised with tacrolimus, she received Zosyn and vancomycin.  Later blood culture came back positive for Streptococcus agalactia, vancomycin was discontinued. ?Echocardiogram ordered-pending ?ID, nephrology and PCCM was also consulted. ?PCCM  also signed off as patient is now stable and not requiring any pressors. ? ?Patient developed diarrhea today, GI pathogen panel sent. ?Respiratory viral panel negative. ?Zithromax was also added for CAP coverage. ? ? ?Assessment and Plan: ?* Severe sepsis with septic shock (CODE) ([place]) ?Patient met severe sepsis with septic shock criteria with fever, tachycardia, tachypnea, lactic acidosis and AKI.  She was hypotensive and required 3 IV boluses, levo was ordered but never required. ?PCCM was also consulted, now the signed off as she is currently stable. ?Blood cultures growing strep agalactiae, concern of GI or GU source. ?Urine and respiratory cultures pending. ?Chest x-ray concerning for pneumonia-on repeat there is some worsening. ?MRSA PCR negative. ?Procalcitonin continue to rise, at 65.95 today. ?Echocardiogram ordered-pending ?GI pathogen ordered pending ?ID consult pending. ?-Continue with Zosyn ?-Add Zithromax ?-Current continue with supportive care. ? ?HCAP (healthcare-associated pneumonia) ?Chest x-ray concerning for right middle lower lobe pneumonia on admission, repeat x-ray done this morning with bilateral worsening opacities concerning for pneumonia.  MRSA PCR negative.  Strep pneumo antigen negative, Legionella pending. ?-Continue with Zosyn ?-Add Zithromax ? ?AKI (acute kidney injury) ([place]) ?Most likely with severe sepsis.  Baseline creatinine appears to be less than 1.  Also has an history of nephrotic syndrome.  Creatinine started improving, at 1.3 today. ?Nephrology was also consulted. ?-Monitor renal function ?-Avoid nephrotoxins ? 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 ?-Nephrology advised to hold tacrolimus until infection is clear. ?-Per nephrology she might get benefit from a repeat kidney biopsy to see if it is minimal-change or diabetic nephropathy as  management will be different. ?-Patient follow-up with  pediatric nephrology at [facility]. ? ?Abdominal pain ?Resolved. ?No more nausea or vomiting today. ?Did develop some diarrhea-GI pathogen panel sent. ?General surgery was also consulted but there is no concern of acute abdomen as CT abdomen was without any acute findings. ?-Continue to monitor ?-Supportive care ? ?HTN (hypertension) ?Patient presented initially with hypotension secondary to septic shock which has been resolved now.  Blood pressure now within goal. ?-Keep holding home Lasix. ?-Continue to monitor ? 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 ?CBG elevated. ?Patient was on Lantus 30 units daily along with NovoLog. ?-Make glargine 12 units twice daily. ?-Add 3 units for mealtime coverage ?-Continue with sliding scale ? ?Hypokalemia ?Resolved after repletion. ?-Monitor potassium and replete as needed ? ?Hypomagnesemia ?Resolved with repletion. ?-Continue to monitor and replete as needed ? ?HLD (hyperlipidemia) ?- Continue with Lipitor ? ? ?Subjective: Patient was seen and examined in today.  Abdominal pain has been improved.  No nausea or vomiting.  Having a cough for the past 3 days and developed diarrhea since this morning. ? ?Physical Exam: ?Vitals:  ? 09/27/21 0700 09/27/21 0800 09/27/21 1100 09/27/21 1213  ?BP: 127/80 120/84 (!) 137/94 (!) 134/94  ?Pulse: (!) 122 (!) 113 (!) 118 (!) 105  ?Resp: (!) 35 (!) 30 (!) 4 (!) 26  ?Temp:  99.5 ?F (37.5 ?C)  98.2 ?F (36.8 ?C)  ?TempSrc:  Oral  Oral  ?SpO2: 99% 100% 97% 94%  ?Weight:      ?Height:      ? ?General.  Young lady, in no acute distress. ?Pulmonary.  Lungs clear bilaterally, normal respiratory effort. ?CV.  Regular rate and rhythm, no JVD, rub or murmur. ?Abdomen.  Soft, nontender, nondistended, BS positive. ?CNS.  Alert and oriented .  No focal neurologic deficit. ?Extremities.  No edema, no cyanosis, pulses intact and symmetrical. ?Psychiatry.  Judgment and insight  appears normal. ? ?Data Reviewed: ?Prior notes, labs and images reviewed. ? ?Family Communication: Discussed with mother on phone ? ?Disposition: ?Status is: Inpatient ?Remains inpatient appropriate because: Severity of illness ? ? Planned Discharge Destination: Home ? ?DVT prophylaxis.  Subcu heparin ? ?Time spent: 55 minutes ? ?This record has been created using Systems analyst. Errors have been sought and corrected,but may not always be located. Such creation errors do not reflect on the standard of care. ? ?Author: ?Lorella Nimrod, MD ?09/27/2021 1:37 PM ? ?For on call review [email or web address].  ?

## 2021-09-27 NOTE — Assessment & Plan Note (Signed)
Most likely with severe sepsis.  Baseline creatinine appears to be less than 1.  Also has an history of nephrotic syndrome.  Creatinine started improving, at 1.3 today. ?Nephrology was also consulted. ?-Monitor renal function ?-Avoid nephrotoxins ?

## 2021-09-27 NOTE — Assessment & Plan Note (Signed)
Patient presented initially with hypotension secondary to septic shock which has been resolved now.  Blood pressure now within goal. ?-Keep holding home Lasix. ?-Continue to monitor ?

## 2021-09-27 NOTE — Assessment & Plan Note (Signed)
Continue with core 

## 2021-09-27 NOTE — Assessment & Plan Note (Signed)
Nephrology was consulted. ?-Continue home dose of tacrolimus ?

## 2021-09-27 NOTE — Progress Notes (Signed)
Inpatient Diabetes Program Recommendations ? ?AACE/ADA: New Consensus Statement on Inpatient Glycemic Control  ? ?Target Ranges:  Prepandial:   less than 140 mg/dL ?     Peak postprandial:   less than 180 mg/dL (1-2 hours) ?     Critically ill patients:  140 - 180 mg/dL  ? ? Latest Reference Range & Units 09/27/21 03:07 09/27/21 07:39  ?Glucose-Capillary 70 - 99 mg/dL 71 161 (H)  ? ? Latest Reference Range & Units 09/26/21 07:53 09/26/21 10:26 09/26/21 15:31 09/26/21 19:22 09/26/21 23:17  ?Glucose-Capillary 70 - 99 mg/dL 95 86 75 096 (H) 045 (H)  ? ?Review of Glycemic Control ? ?History: Type 1 Diabetes, Nephrotic Syndrome ?  ?Home DM Meds: Lantus 30 units QHS, Novolog 5-8 units TID ?Current Orders: Novolog 0-9 units Q4H ? ?Inpatient Diabetes Program Recommendations:   ? ?Insulin: Once glucose begins to rise, please consider ordering basal insulin. ? ?NOTE: Patient has Type 1 DM, well known to inpatient diabetes team due to frequent admissions with DKA, was last inpatient 09/04/21-09/06/21, and diabetes coordinator spoke with patient on 09/05/21.   Patient sees Dr. Tedd Sias (Endocrinologist) for DM management but not seen since 03/02/2021. Patient reported to diabetes coordinator on 09/05/21 that she has an upcoming appointment with Dr. Tedd Sias in April. Patient admitted on 09/26/21 with abdominal pain and SIRS and initial glucose 125 mg/dl at 4:09 am. ? ?Thanks, ?Orlando Penner, RN, MSN, CDE ?Diabetes Coordinator ?Inpatient Diabetes Program ?[phone] (Team Pager from 8am to 5pm) ? ?

## 2021-09-27 NOTE — Progress Notes (Signed)
Patient ID: Catherine [name], female   DOB: 2001-05-29, 21 y.o.   MRN: [id number] ?    SURGICAL PROGRESS NOTE  ? ?Hospital Day(s): 1.  ? ?Interval History: Patient seen and examined, no acute events or new complaints overnight. Patient reports resolved abdominal pain.  Patient denies any nausea with acute liquids.  She endorses having bowel movement. ? ?Vital signs in last 24 hours: [min-max] current  ?Temp:  [97.8 ?F (36.6 ?C)-101.5 ?F (38.6 ?C)] 99.5 ?F (37.5 ?C) (03/28 0800) ?Pulse Rate:  [98-131] 113 (03/28 0800) ?Resp:  [23-41] 30 (03/28 0800) ?BP: (77-127)/(42-84) 120/84 (03/28 0800) ?SpO2:  [89 %-100 %] 100 % (03/28 0800)     Height: 4\' 11"  (149.9 cm) Weight: 60 kg BMI (Calculated): 26.7  ? ?Physical Exam:  ?Constitutional: alert, cooperative and no distress  ?Respiratory: breathing non-labored at rest  ?Cardiovascular: regular rate and sinus rhythm  ?Gastrointestinal: soft, non-tender, and non-distended ? ?Labs:  ? ?  Latest Ref Rng & Units 09/27/2021  ?  4:04 AM 09/26/2021  ?  4:37 AM 09/19/2021  ? 11:38 AM  ?CBC  ?WBC 4.0 - 10.5 K/uL 27.9   17.5   8.2    ?Hemoglobin 12.0 - 15.0 g/dL 09/21/2021   16.0   10.9    ?Hematocrit 36.0 - 46.0 % 33.5   43.1   42.0    ?Platelets 150 - 400 K/uL 204   336   284    ? ? ?  Latest Ref Rng & Units 09/27/2021  ?  4:04 AM 09/26/2021  ?  4:37 AM 09/19/2021  ? 11:38 AM  ?CMP  ?Glucose 70 - 99 mg/dL 67   09/21/2021   557    ?BUN 6 - 20 mg/dL 15   22   24     ?Creatinine 0.44 - 1.00 mg/dL 322     0.25    ?Sodium 135 - 145 mmol/L 139   142   136    ?Potassium 3.5 - 5.1 mmol/L 4.0   3.4   4.1    ?Chloride 98 - 111 mmol/L 111   107   102    ?CO2 22 - 32 mmol/L 22   28   24     ?Calcium 8.9 - 10.3 mg/dL 7.4   8.0   7.8    ?Total Protein 6.5 - 8.1 g/dL 4.9   5.2   5.1    ?Total Bilirubin 0.3 - 1.2 mg/dL 0.6   0.5   0.4    ?Alkaline Phos 38 - 126 U/L 95   104   103    ?AST 15 - 41 U/L 96   24   15    ?ALT 0 - 44 U/L 41   11   10    ? ? ?Imaging studies: No new pertinent imaging  studies ? ? ?Assessment/Plan:  ?21 y.o. female with abdominal pain, complicated by pertinent comorbidities including nephrotic syndrome, diabetes mellitus type 1, hypertension. ? ?Abdominal pain seems to be resolved.  I have low suspicion for intra-abdominal pathology.  Diet could be advanced as tolerated.  Patient was found with positive bacteremia.  This can explain her fevers, her tachycardia and white blood cell count.  Also found with community-acquired pneumonia. ? ?From the surgical standpoint, no indication for surgery at this point.  No indication for further intra-abdominal pathology or imaging at this moment.  If there is any clinical deterioration and intra-abdominal pathology is suspected I recommend CT scan of the abdomen  and pelvis with contrast. ? ?I will continue to follow along. ? ?Wilmon Arms, MD ? ? ? ?

## 2021-09-27 NOTE — Consult Note (Signed)
NAME: Catherine [name]  ?DOB: 2000/12/30  ?MRN: [id number]  ?Date/Time: 09/27/2021 2:32 PM ?:  ?REQUESTING PROVIDER:Dana Graves ?Subjective:  ?REASON FOR CONSULT: group B streptococcus bacteremia ??chart reviewed, history from patient ?Catherine [name] is a 21 y.o. 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 WBC 17.5, cr 1.48,  ?Started on broad spectrum antibiotic and blood culture positive for GBS and I am asked to see the patient ? ?Past Medical History:  ?Diagnosis Date  ? Diabetes mellitus without complication (HCC)   ? 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 Nephritic syndrome   ? Nephrotic syndrome 08/23/2019  ?  ?PSH-none ?  ?Social History  ? ?Socioeconomic History  ? Marital status: Single  ?  Spouse name: Not on file  ? Number of children: Not on file  ? Years of education: Not on file  ? Highest education level: Not on file  ?Occupational History  ? Not on file  ?Tobacco Use  ? Smoking status: Never  ? Smokeless tobacco: Never  ?Vaping Use  ? Vaping Use: Never used  ?Substance and Sexual Activity  ? Alcohol use: Not Currently  ? Drug use: Not Currently  ? Sexual activity: Not Currently  ?Other Topics Concern  ? Not on file  ?Social History Narrative  ? Not on file  ? ?Social Determinants of Health  ? ?Financial Resource Strain: Not on file  ?Food Insecurity: Not on file  ?Transportation Needs: Not on file  ?Physical Activity: Not on file  ?Stress: Not on file  ?Social Connections: Not on file  ?Intimate Partner Violence: Not on file  ?  ?Family history ?Thyroid disease Mother  ? Diabetes Maternal Aunt   ? Hypertension Maternal Grandfather  ?No Known Allergies ?I? ?Current Facility-Administered Medications  ?Medication Dose Route Frequency Provider Last Rate Last Admin  ? 0.9 %  sodium chloride infusion   Intravenous PRN Lorretta Harp, MD   Stopped at 09/27/21 0040  ? 0.9 %  sodium chloride infusion   Intravenous PRN Lorretta Harp, MD   Stopped at 09/27/21 0553  ? acetaminophen (TYLENOL) tablet 650 mg  650 mg Oral Q4H PRN Rust-Chester, Cecelia Byars, NP   650 mg at 09/26/21 2143  ? albuterol (VENTOLIN HFA) 108 (90 Base) MCG/ACT inhaler 2 puff  2 puff Inhalation Q4H PRN Lorretta Harp, MD      ? azithromycin (ZITHROMAX) 500 mg in sodium chloride 0.9 % 250 mL IVPB  500 mg Intravenous Q24H Arnetha Courser, MD 250 mL/hr at 09/27/21 1419 500 mg at 09/27/21 1419  ? Chlorhexidine Gluconate Cloth 2 % PADS 6 each  6 each Topical Daily Lorretta Harp, MD      ? dextromethorphan-guaiFENesin (MUCINEX DM) 30-600 MG per 12 hr tablet 1 tablet  1 tablet Oral BID PRN Lorretta Harp, MD      ? heparin injection 5,000 Units  5,000 Units Subcutaneous Q8H Lorretta Harp, MD   5,000 Units at 09/27/21 1037  ? insulin aspart (novoLOG) injection 0-5 Units  0-5 Units Subcutaneous QHS Ezequiel Essex, NP      ?  insulin aspart (novoLOG) injection 0-9 Units  0-9 Units Subcutaneous TID WC Ezequiel Essex, NP   3 Units at 09/27/21 1303  ? insulin aspart (novoLOG) injection 3 Units  3 Units Subcutaneous TID WC Arnetha Courser, MD      ? insulin glargine-yfgn (SEMGLEE) injection 12 Units  12 Units Subcutaneous BID Arnetha Courser, MD      ? metoCLOPramide (REGLAN) injection 5 mg  5 mg Intravenous Q8H PRN Martyn Malay, RPH      ? morphine (PF) 2 MG/ML injection 2 mg  2 mg Intravenous Q4H PRN Lorretta Harp, MD   2 mg at 09/26/21 2347  ? ondansetron (ZOFRAN) injection 4 mg  4 mg Intravenous Q8H PRN Lorretta Harp, MD      ? piperacillin-tazobactam (ZOSYN) IVPB 3.375 g  3.375 g Intravenous Q8H Cheron Every E, RPH 12.5 mL/hr at 09/27/21 1303 3.375 g at 09/27/21 1303  ?  ? ?Abtx:   ?Anti-infectives (From admission, onward)  ? ? Start     Dose/Rate Route Frequency Ordered Stop  ? 09/27/21 1400  azithromycin (ZITHROMAX) 500 mg in sodium chloride 0.9 % 250 mL IVPB       ? 500 mg ?250 mL/hr over 60 Minutes Intravenous Every 24 hours 09/27/21 1250    ? 09/27/21 1100  vancomycin (VANCOREADY) IVPB 750 mg/150 mL  Status:  Discontinued       ? 750 mg ?150 mL/hr over 60 Minutes Intravenous Every 24 hours 09/26/21 0936 09/26/21 0943  ? 09/26/21 1400  piperacillin-tazobactam (ZOSYN) IVPB 3.375 g  Status:  Discontinued       ? 3.375 g ?12.5 mL/hr over 240 Minutes Intravenous Every 8 hours 09/26/21 0813 09/26/21 0933  ? 09/26/21 1400  piperacillin-tazobactam (ZOSYN) IVPB 3.375 g       ? 3.375 g ?12.5 mL/hr over 240 Minutes Intravenous Every 8 hours 09/26/21 0933    ? 09/26/21 1000  vancomycin (VANCOREADY) IVPB 1500 mg/300 mL       ? 1,500 mg ?150 mL/hr over 120 Minutes Intravenous Once 09/26/21 0915 09/26/21 1208  ? 09/26/21 0943  vancomycin variable dose per unstable renal function (pharmacist dosing)  Status:  Discontinued       ?  Does not apply See admin instructions 09/26/21 0943 09/26/21 2012  ? 09/26/21 0930  azithromycin (ZITHROMAX) 500 mg in sodium chloride 0.9 % 250 mL IVPB  Status:  Discontinued       ? 500 mg ?250 mL/hr over 60 Minutes Intravenous Every 24 hours 09/26/21 0918 09/26/21 0921  ? 09/26/21 0745  piperacillin-tazobactam (ZOSYN) IVPB 3.375 g       ? 3.375 g ?100 mL/hr over 30 Minutes Intravenous  Once 09/26/21 0737 09/26/21 0837  ? ?  ? ? ?REVIEW OF SYSTEMS:  ?Const: negative fever, negative chills, negative weight loss ?Eyes: negative diplopia or visual changes, negative eye pain ?ENT: negative coryza, negative sore throat ?Resp: + cough, no hemoptysis, dyspnea ?Cards: negative for chest pain, palpitations, has lower extremity edema ?GU: negative for frequency, dysuria and hematuria ?GI: + abdominal pain, diarrhea 3-4 watery stool- no blood or mucus, vomiting ?Skin: negative for rash  and pruritus ?Heme: negative for easy bruising and gum/nose bleeding ?MS: weakness ?Neurolo:negative for headaches, dizziness, vertigo, memory problems  ?Psych:  anxiety, depression  ?Endocrine:  diabetes ?Allergy/Immunology- negative for any medication or food allergies ?? ?On contraceptive- no periods ?Sexually active with 1 female partner ?Objective:  ?VITALS:  ?BP (!) 133/93   Pulse (!) 113  Temp 98.2 ?F (36.8 ?C) (Oral)   Resp (!) 38   Ht 4\' 11"  (1.499 m)   Wt 60 kg   LMP  (LMP Unknown)   SpO2 93%   BMI 26.72 kg/m?  ?PHYSICAL EXAM:  ?General: Alert, cooperative, no distress, appears stated age.  ?Head: Normocephalic, without obvious abnormality, atraumatic. ?Eyes: Conjunctivae clear, anicteric sclerae. Pupils are equal ?ENT Nares normal. No drainage or sinus tenderness. ?Lips, mucosa, and tongue normal. No Thrush ?Neck: Supple, symmetrical, no adenopathy, thyroid: non tender ?no carotid bruit and no JVD. ?Back: No CVA tenderness. ?Lungs: Clear to auscultation bilaterally. No Wheezing or Rhonchi. No rales. ?Heart: Regular rate and rhythm, no murmur, rub or gallop. ?Abdomen: Soft, -tender epigastrium ? Bowel sounds normal. No masses ?Extremities:edema ankles left > right ?Skin: No rashes or lesions. Or bruising ?Lymph: Cervical, supraclavicular normal. ?Neurologic: Grossly non-focal ?Pertinent Labs ?Lab Results ?CBC ?   ?Component Value Date/Time  ? WBC 27.9 (H) 09/27/2021 0404  ? RBC 3.78 (L) 09/27/2021 0404  ? HGB 10.9 (L) 09/27/2021 0404  ? HCT 33.5 (L) 09/27/2021 0404  ? PLT 204 09/27/2021 0404  ? MCV 88.6 09/27/2021 0404  ? MCH 28.8 09/27/2021 0404  ? MCHC 32.5 09/27/2021 0404  ? RDW 14.4 09/27/2021 0404  ? LYMPHSABS 1.7 09/26/2021 0437  ? MONOABS 0.5 09/26/2021 0437  ? EOSABS 0.1 09/26/2021 0437  ? BASOSABS 0.1 09/26/2021 0437  ? ? ? ?  Latest Ref Rng & Units 09/27/2021  ?  4:04 AM 09/26/2021  ?  4:37 AM 09/19/2021  ? 11:38 AM  ?CMP  ?Glucose 70 - 99 mg/dL 67   161125   [id number]    ?BUN 6 - 20 mg/dL 15   22    24     ?Creatinine 0.44 - 1.00 mg/dL 0.451.30   4.091.48   8.111.07    ?Sodium 135 - 145 mmol/L 139   142   136    ?Potassium 3.5 - 5.1 mmol/L 4.0   3.4   4.1    ?Chloride 98 - 111 mmol/L 111   107   102    ?CO2 22 - 32 mmol/L

## 2021-09-28 ENCOUNTER — Encounter: Payer: Self-pay | Admitting: Internal Medicine

## 2021-09-28 ENCOUNTER — Inpatient Hospital Stay: Payer: Medicaid Other

## 2021-09-28 DIAGNOSIS — B951 Streptococcus, group B, as the cause of diseases classified elsewhere: Secondary | ICD-10-CM

## 2021-09-28 DIAGNOSIS — R6521 Severe sepsis with septic shock: Secondary | ICD-10-CM

## 2021-09-28 LAB — CULTURE, BLOOD (ROUTINE X 2)

## 2021-09-28 LAB — HEPATIC FUNCTION PANEL
ALT: 26 U/L (ref 0–44)
AST: 25 U/L (ref 15–41)
Albumin: <1.5 g/dL — ABNORMAL LOW (ref 3.5–5.0)
Alkaline Phosphatase: 140 U/L — ABNORMAL HIGH (ref 38–126)
Bilirubin, Direct: <0.1 mg/dL (ref 0.0–0.2)
Total Bilirubin: 0.5 mg/dL (ref 0.3–1.2)
Total Protein: 4.2 g/dL — ABNORMAL LOW (ref 6.5–8.1)

## 2021-09-28 LAB — CBC WITH DIFFERENTIAL/PLATELET
Abs Immature Granulocytes: 0.41 10*3/uL — ABNORMAL HIGH (ref 0.00–0.07)
Basophils Absolute: 0.1 10*3/uL (ref 0.0–0.1)
Basophils Relative: 0 %
Eosinophils Absolute: 0.4 10*3/uL (ref 0.0–0.5)
Eosinophils Relative: 2 %
HCT: 31.1 % — ABNORMAL LOW (ref 36.0–46.0)
Hemoglobin: 10.3 g/dL — ABNORMAL LOW (ref 12.0–15.0)
Immature Granulocytes: 2 %
Lymphocytes Relative: 13 %
Lymphs Abs: 3 10*3/uL (ref 0.7–4.0)
MCH: 28.6 pg (ref 26.0–34.0)
MCHC: 33.1 g/dL (ref 30.0–36.0)
MCV: 86.4 fL (ref 80.0–100.0)
Monocytes Absolute: 0.9 10*3/uL (ref 0.1–1.0)
Monocytes Relative: 4 %
Neutro Abs: 18 10*3/uL — ABNORMAL HIGH (ref 1.7–7.7)
Neutrophils Relative %: 79 %
Platelets: 252 10*3/uL (ref 150–400)
RBC: 3.6 MIL/uL — ABNORMAL LOW (ref 3.87–5.11)
RDW: 13.9 % (ref 11.5–15.5)
WBC: 22.7 10*3/uL — ABNORMAL HIGH (ref 4.0–10.5)
nRBC: 0 % (ref 0.0–0.2)

## 2021-09-28 LAB — GLUCOSE, CAPILLARY
Glucose-Capillary: 267 mg/dL — ABNORMAL HIGH (ref 70–99)
Glucose-Capillary: 292 mg/dL — ABNORMAL HIGH (ref 70–99)
Glucose-Capillary: 209 mg/dL — ABNORMAL HIGH (ref 70–99)
Glucose-Capillary: 156 mg/dL — ABNORMAL HIGH (ref 70–99)
Glucose-Capillary: 149 mg/dL — ABNORMAL HIGH (ref 70–99)

## 2021-09-28 LAB — BASIC METABOLIC PANEL
Anion gap: 5 (ref 5–15)
BUN: 11 mg/dL (ref 6–20)
CO2: 20 mmol/L — ABNORMAL LOW (ref 22–32)
Calcium: 7.4 mg/dL — ABNORMAL LOW (ref 8.9–10.3)
Chloride: 108 mmol/L (ref 98–111)
Creatinine, Ser: 1.34 mg/dL — ABNORMAL HIGH (ref 0.44–1.00)
GFR, Estimated: 58 mL/min — ABNORMAL LOW (ref >60)
Glucose, Bld: 293 mg/dL — ABNORMAL HIGH (ref 70–99)
Potassium: 3.9 mmol/L (ref 3.5–5.1)
Sodium: 133 mmol/L — ABNORMAL LOW (ref 135–145)

## 2021-09-28 LAB — PROCALCITONIN: Procalcitonin: 38.51 ng/mL

## 2021-09-28 LAB — LEGIONELLA PNEUMOPHILA SEROGP 1 UR AG: L. pneumophila Serogp 1 Ur Ag: NEGATIVE

## 2021-09-28 LAB — LIPASE, BLOOD: Lipase: 36 U/L (ref 11–51)

## 2021-09-28 LAB — AMYLASE: Amylase: 22 U/L — ABNORMAL LOW (ref 28–100)

## 2021-09-28 MED ORDER — TACROLIMUS 1 MG PO CAPS
2.0000 mg | ORAL_CAPSULE | Freq: Two times a day (BID) | ORAL | Status: DC
  Administered 2021-09-28 – 2021-09-29 (×2): 2 mg via ORAL
  Filled 2021-09-28 (×2): qty 2

## 2021-09-28 MED ORDER — INSULIN GLARGINE-YFGN 100 UNIT/ML ~~LOC~~ SOLN
15.0000 [IU] | Freq: Two times a day (BID) | SUBCUTANEOUS | Status: DC
  Administered 2021-09-28 – 2021-09-30 (×4): 15 [IU] via SUBCUTANEOUS
  Filled 2021-09-28 (×5): qty 0.15

## 2021-09-28 MED ORDER — PREDNISONE 20 MG PO TABS
40.0000 mg | ORAL_TABLET | Freq: Every day | ORAL | Status: DC
  Administered 2021-09-28 – 2021-09-30 (×3): 40 mg via ORAL
  Filled 2021-09-28: qty 2
  Filled 2021-09-28: qty 4
  Filled 2021-09-28: qty 2

## 2021-09-28 NOTE — Progress Notes (Signed)
Patient alert and oriented x4, ST, on room air. Incentive spirometer teaching reviewed with patient, patient encourage to use IS and flutter valve. Patient ambulates independently in room, tolerates well. Patient resting in bed, family updated at bedside. Handoff given to floor RN.  ?

## 2021-09-28 NOTE — Assessment & Plan Note (Signed)
Patient presented initially with hypotension secondary to septic shock which has been resolved now.  Blood pressure now within goal. ?-Keep holding home Lasix. ?-Continue to monitor ?

## 2021-09-28 NOTE — Progress Notes (Signed)
?Progress Note ? ? ?Patient: Catherine [name] QMG:[id number] DOB: 04/09/2001 DOA: 09/26/2021     2 ?DOS: the patient was seen and examined on 09/28/2021 ?  ?Brief hospital course: ?Taken from prior notes. ? ?Catherine [name] is a 21 y.o. female with medical history significant of type 1 diabetes, hypertension, hyperlipidemia, nephrotic syndrome on tacrolimus, who presents with abdominal pain. ?  ?Patient states that her symptoms started in the early morning at about 1 AM.  Patient has nausea, vomiting and abdominal pain.  She has vomited more than 10 times with nonbilious nonbloody vomiting.  No diarrhea.  Her abdominal pain is diffuse, severe, sharp, nonradiating.  Patient denies subjective fever, but she has temperature 102.2 in ED.  No chills. Patient reports shortness of breath, no cough, chest pain.  She had 1 episode of oxygen desaturating to 87% on room air, which improved to 98-100% on room air later on.  Denies symptoms of UTI.  Patient is lethargic, but easily arousable, oriented x3.  Moves all extremities normally. ? ?Patient was initially hypotensive with blood pressure 86/57, which improved to 103/62 after giving 2 L LR bolus, but dropped to 77/50, then back to 94/51 after given 3L of LR. Levophed is ordered if MAP < 65 again, but it was never required and blood pressure remained within goal after that.. ? ?CT abdomen was negative for any acute intra-abdominal findings.  General surgery was also consulted and they signed off as there was no concern of acute abdomen. ?Chest x-ray concerning for patchy bilateral pneumonia.  She was having cough for 3 days. ?MRSA PCR negative. ? ?Patient was initially started on broad-spectrum antibiotics due to concern of immunocompromised with tacrolimus, she received Zosyn and vancomycin.  Later blood culture came back positive for Streptococcus agalactia, vancomycin was discontinued. ?Echocardiogram ordered-pending ?ID, nephrology and PCCM was also consulted. ?PCCM  also signed off as patient is now stable and not requiring any pressors. ? ?Patient developed diarrhea today, GI pathogen panel negative. ?Respiratory viral panel negative. ?Zithromax was also added for CAP coverage. ? ?Echocardiogram was normal, no vegetations.  No need for TEE per ID. ? ?Clinically seems improving.  Zosyn was switched with Unasyn by ID. ? ?Renal duplex scan done today by nephrology was negative. ? ? ?Assessment and Plan: ?* Severe sepsis with septic shock (CODE) ([place]) ?Patient met severe sepsis with septic shock criteria with fever, tachycardia, tachypnea, lactic acidosis and AKI.  She was hypotensive and required 3 IV boluses, levo was ordered but never required. ?PCCM was also consulted, now the signed off as she is currently stable. ?Blood cultures growing strep agalactiae, concern of GI or GU source. ?Urine and respiratory cultures pending. ?Chest x-ray concerning for pneumonia-on repeat there is some worsening. ?MRSA PCR negative. ?Procalcitonin started improving 65.95>> 38.51 today. ?Echocardiogram was normal ?GI pathogen panel negative ?ID consult is on board.  Zosyn was switched with Unasyn ?-Continue with Unasyn ?-Continue Zithromax ?-Current continue with supportive care. ? ?HCAP (healthcare-associated pneumonia) ?Chest x-ray concerning for right middle lower lobe pneumonia on admission, repeat x-ray done this morning with bilateral worsening opacities concerning for pneumonia.  MRSA PCR negative.  Strep pneumo antigen negative, Legionella negative ?-Continue with Unasyn ?-Continue Zithromax ?-Continue with supportive care ? ?AKI (acute kidney injury) ([place]) ?Most likely with severe sepsis.  Baseline creatinine appears to be less than 1.  Also has an history of nephrotic syndrome.  Creatinine currently stable, at 1.3 today. ?Nephrology was also consulted. ?-Monitor renal function ?-Avoid nephrotoxins ? ?  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 ?-Nephrology advised to hold tacrolimus until infection is clear. ?-Per nephrology she might get benefit from a repeat kidney biopsy to see if it is minimal-change or diabetic nephropathy as management will be different. ?-Patient follow-up with pediatric nephrology at [facility]. ? ?Abdominal pain ?Resolved. ?No more nausea or vomiting today. ?Did develop some diarrhea-GI pathogen panel sent. ?General surgery was also consulted but there is no concern of acute abdomen as CT abdomen was without any acute findings. ?-Continue to monitor ?-Supportive care ? ?HTN (hypertension) ?Patient presented initially with hypotension secondary to septic shock which has been resolved now.  Blood pressure now within goal. ?-Keep holding home Lasix. ?-Continue to monitor ? 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 ?CBG elevated. ?Patient was on Lantus 30 units daily along with NovoLog. ?-Increase Semglee to 15 units twice daily. ?-Add 3 units for mealtime coverage ?-Continue with sliding scale ? ?Hypokalemia ?Resolved after repletion. ?-Monitor potassium and replete as needed ? ?Hypomagnesemia ?Resolved with repletion. ?-Continue to monitor and replete as needed ? ?HLD (hyperlipidemia) ?- Continue with Lipitor ? ? ?Subjective: Patient was feeling much improved when seen today.  Both parents at bedside. ?Able to tolerate diet. ? ?Physical Exam: ?Vitals:  ? 09/28/21 0958 09/28/21 1000 09/28/21 1200 09/28/21 1525  ?BP: 120/82 120/82  121/86  ?Pulse: 99 100 (!) 112 (!) 102  ?Resp: (!) 22 17 (!) 24 19  ?Temp:   97.7 ?F (36.5 ?C) 98.1 ?F (36.7 ?C)  ?TempSrc:   Oral Oral  ?SpO2: 100% 100% 100% 100%  ?Weight:      ?Height:      ? ?General.     In no acute distress. ?Pulmonary.  Lungs clear bilaterally, normal respiratory effort. ?CV.  Regular rate and rhythm, no JVD, rub or murmur. ?Abdomen.  Soft, nontender, nondistended, BS  positive. ?CNS.  Alert and oriented x3.  No focal neurologic deficit. ?Extremities.  No edema, no cyanosis, pulses intact and symmetrical. ?Psychiatry.  Judgment and insight appears normal. ? ?Data Reviewed: ?Prior notes, labs and images reviewed ? ?Family Communication: Discussed with both parents at bedside ? ?Disposition: ?Status is: Inpatient ?Remains inpatient appropriate because: Severity of illness ? ? Planned Discharge Destination: Home ? ?DVT prophylaxis.  Subcu heparin ? ?Time spent: 45 minutes ? ?This record has been created using Systems analyst. Errors have been sought and corrected,but may not always be located. Such creation errors do not reflect on the standard of care. ? ?Author: ?Lorella Nimrod, MD ?09/28/2021 3:54 PM ? ?For on call review [email or web address].  ?

## 2021-09-28 NOTE — Assessment & Plan Note (Signed)
Chest x-ray concerning for right middle lower lobe pneumonia on admission, repeat x-ray done this morning with bilateral worsening opacities concerning for pneumonia.  MRSA PCR negative.  Strep pneumo antigen negative, Legionella negative ?-Continue with Unasyn ?-Continue Zithromax ?-Continue with supportive care ?

## 2021-09-28 NOTE — Progress Notes (Signed)
[facility] ?[place], [place] ?09/28/21 ? ?Subjective:  ? ?Hospital day # 2 ? ?Patient is doing well today.  States abdominal pain is better. ?Now on room air ?Blood pressures well controlled at 121/86 this afternoon. ? ? ?Renal: ?03/28 0701 - 03/29 0700 ?In: 694 [P.O.:240; I.V.:8.1; IV Piggyback:446] ?Out: 3132 [Urine:3130; Stool:2] ?Lab Results  ?Component Value Date  ? CREATININE 1.34 (H) 09/28/2021  ? CREATININE 1.30 (H) 09/27/2021  ? CREATININE 1.48 (H) 09/26/2021  ? ? ? ?Objective:  ?Vital signs in last 24 hours:  ?Temp:  [97.7 ?F (36.5 ?C)-99.4 ?F (37.4 ?C)] 98.1 ?F (36.7 ?C) (03/29 1525) ?Pulse Rate:  [98-132] 102 (03/29 1525) ?Resp:  [0-47] 19 (03/29 1525) ?BP: (116-131)/(70-93) 121/86 (03/29 1525) ?SpO2:  [90 %-100 %] 100 % (03/29 1525) ? ?Weight change:  ?Filed Weights  ? 09/26/21 0409  ?Weight: 60 kg  ? ? ?Intake/Output: ?  ? ?Intake/Output Summary (Last 24 hours) at 09/28/2021 1544 ?Last data filed at 09/28/2021 1500 ?Gross per 24 hour  ?Intake 480.32 ml  ?Output 1200 ml  ?Net -719.68 ml  ? ? ? ? ?Physical Exam: ?General: INAD, laying in the bed  ?HEENT Room air, moist oral mucous membranes  ?Pulm/lungs Coarse breath sounds  ?CVS/Heart Tachycardic  ?Abdomen:  Soft  ?Extremities: + Dependent peripheral edema up to thighs  ?Neurologic: Alert, oriented  ?Skin: Warm, dry  ?   ?   ? ? ?Basic Metabolic Panel: ? ?Recent Labs  ?Lab 09/26/21 ?0437 09/26/21 ?0810 09/27/21 ?0404 09/28/21 ?[id number]  ?NA 142  --  139 133*  ?K 3.4*  --  4.0 3.9  ?CL 107  --  111 108  ?CO2 28  --  22 20*  ?GLUCOSE 125*  --  67* 293*  ?BUN 22*  --  15 11  ?CREATININE 1.48*  --  1.30* 1.34*  ?CALCIUM 8.0*  --  7.4* 7.4*  ?MG  --  1.5* 2.2  --   ?PHOS  --   --  4.0  --   ? ? ? ? ?CBC: ?Recent Labs  ?Lab 09/26/21 ?0437 09/27/21 ?0404 09/28/21 ?[id number]  ?WBC 17.5* 27.9* 22.7*  ?NEUTROABS 15.1*  --  18.0*  ?HGB 14.0 10.9* 10.3*  ?HCT 43.1 33.5* 31.1*  ?MCV 86.7 88.6 86.4  ?PLT 336 204 252  ? ? ? ?  ?Lab Results  ?Component Value  Date  ? HEPBSAG NON REACTIVE 09/04/2021  ? HEPBIGM NON REACTIVE 09/04/2021  ? ? ? ? ?Microbiology: ? ?Recent Results (from the past 240 hour(s))  ?Urine Culture     Status: Abnormal  ? Collection Time: 09/26/21  5:18 AM  ? Specimen: Urine, Clean Catch  ?Result Value Ref Range Status  ? Specimen Description URINE, CLEAN CATCH  Final  ? Special Requests NONE  Final  ? Culture (A)  Final  ?  4,000 COLONIES/mL GROUP B STREP(S.AGALACTIAE)ISOLATED ?TESTING AGAINST S. AGALACTIAE NOT ROUTINELY PERFORMED DUE TO PREDICTABILITY OF AMP/PEN/VAN SUSCEPTIBILITY. ?  ? Report Status 09/27/2021 FINAL  Final  ?Blood Culture (routine x 2)     Status: Abnormal  ? Collection Time: 09/26/21  8:10 AM  ? Specimen: BLOOD  ?Result Value Ref Range Status  ? Specimen Description   Final  ?  BLOOD LEFT ANTECUBITAL ?Performed at [facility], [address] [place], [place] [id number] ?  ? Special Requests   Final  ?  BOTTLES DRAWN AEROBIC AND ANAEROBIC Blood Culture results may not be optimal due to an excessive volume of  blood received in culture bottles ?Performed at [facility], [address] [place], [place] [address] ?  ? Culture  Setup Time   Final  ?  GRAM POSITIVE COCCI ?ANAEROBIC BOTTLE ONLY ?Organism ID to follow ?CRITICAL RESULT CALLED TO, READ BACK BY AND VERIFIED WITH: ALEX CHAPPELL 2001 09/26/21 MU ?  ? Culture GROUP B STREP(S.AGALACTIAE)ISOLATED (A)  Final  ? Report Status 09/28/2021 FINAL  Final  ? Organism ID, Bacteria GROUP B STREP(S.AGALACTIAE)ISOLATED  Final  ?    Susceptibility  ? Group b strep(s.agalactiae)isolated - MIC*  ?  CLINDAMYCIN <=0.25 SENSITIVE Sensitive   ?  AMPICILLIN <=0.25 SENSITIVE Sensitive   ?  ERYTHROMYCIN <=0.12 SENSITIVE Sensitive   ?  VANCOMYCIN 0.5 SENSITIVE Sensitive   ?  CEFTRIAXONE <=0.12 SENSITIVE Sensitive   ?  LEVOFLOXACIN 1 SENSITIVE Sensitive   ?  * GROUP B STREP(S.AGALACTIAE)ISOLATED  ?Blood Culture ID Panel (Reflexed)     Status: Abnormal  ? Collection Time: 09/26/21   8:10 AM  ?Result Value Ref Range Status  ? Enterococcus faecalis NOT DETECTED NOT DETECTED Final  ? Enterococcus Faecium NOT DETECTED NOT DETECTED Final  ? Listeria monocytogenes NOT DETECTED NOT DETECTED Final  ? Staphylococcus species NOT DETECTED NOT DETECTED Final  ? Staphylococcus aureus (BCID) NOT DETECTED NOT DETECTED Final  ? Staphylococcus epidermidis NOT DETECTED NOT DETECTED Final  ? Staphylococcus lugdunensis NOT DETECTED NOT DETECTED Final  ? Streptococcus species DETECTED (A) NOT DETECTED Final  ?  Comment: CRITICAL RESULT CALLED TO, READ BACK BY AND VERIFIED WITH: ?ALEC CHAPPELL 2001 09/26/21 MU ?  ? Streptococcus agalactiae DETECTED (A) NOT DETECTED Final  ?  Comment: CRITICAL RESULT CALLED TO, READ BACK BY AND VERIFIED WITH: ?ALEX CHAPPELL 2001 09/26/21 MU ?  ? Streptococcus pneumoniae NOT DETECTED NOT DETECTED Final  ? Streptococcus pyogenes NOT DETECTED NOT DETECTED Final  ? A.calcoaceticus-baumannii NOT DETECTED NOT DETECTED Final  ? Bacteroides fragilis NOT DETECTED NOT DETECTED Final  ? Enterobacterales NOT DETECTED NOT DETECTED Final  ? Enterobacter cloacae complex NOT DETECTED NOT DETECTED Final  ? Escherichia coli NOT DETECTED NOT DETECTED Final  ? Klebsiella aerogenes NOT DETECTED NOT DETECTED Final  ? Klebsiella oxytoca NOT DETECTED NOT DETECTED Final  ? Klebsiella pneumoniae NOT DETECTED NOT DETECTED Final  ? Proteus species NOT DETECTED NOT DETECTED Final  ? Salmonella species NOT DETECTED NOT DETECTED Final  ? Serratia marcescens NOT DETECTED NOT DETECTED Final  ? Haemophilus influenzae NOT DETECTED NOT DETECTED Final  ? Neisseria meningitidis NOT DETECTED NOT DETECTED Final  ? Pseudomonas aeruginosa NOT DETECTED NOT DETECTED Final  ? Stenotrophomonas maltophilia NOT DETECTED NOT DETECTED Final  ? Candida albicans NOT DETECTED NOT DETECTED Final  ? Candida auris NOT DETECTED NOT DETECTED Final  ? Candida glabrata NOT DETECTED NOT DETECTED Final  ? Candida krusei NOT DETECTED NOT DETECTED  Final  ? Candida parapsilosis NOT DETECTED NOT DETECTED Final  ? Candida tropicalis NOT DETECTED NOT DETECTED Final  ? Cryptococcus neoformans/gattii NOT DETECTED NOT DETECTED Final  ?  Comment: Performed at [facility], [address] [place], [place] [address]  ?Blood Culture (routine x 2)     Status: None (Preliminary result)  ? Collection Time: 09/26/21  8:11 AM  ? Specimen: BLOOD  ?Result Value Ref Range Status  ? Specimen Description BLOOD BLOOD LEFT WRIST  Final  ? Special Requests   Final  ?  BOTTLES DRAWN AEROBIC AND ANAEROBIC Blood Culture adequate volume  ? Culture   Final  ?  NO GROWTH 2 DAYS ?Performed at [facility], [address], [place], [place] [address] ?  ? Report Status PENDING  Incomplete  ?Resp Panel by RT-PCR (Flu A&B, Covid) Nasopharyngeal Swab     Status: None  ? Collection Time: 09/26/21  8:11 AM  ? Specimen: Nasopharyngeal Swab; Nasopharyngeal(NP) swabs in vial transport medium  ?Result Value Ref Range Status  ? SARS Coronavirus 2 by RT PCR NEGATIVE NEGATIVE Final  ?  Comment: (NOTE) ?SARS-CoV-2 target nucleic acids are NOT DETECTED. ? ?The SARS-CoV-2 RNA is generally detectable in upper respiratory ?specimens during the acute phase of infection. The lowest ?concentration of SARS-CoV-2 viral copies this assay can detect is ?138 copies/mL. A negative result does not preclude SARS-Cov-2 ?infection and should not be used as the sole basis for treatment or ?other patient management decisions. A negative result may occur with  ?improper specimen collection/handling, submission of specimen other ?than nasopharyngeal swab, presence of viral mutation(s) within the ?areas targeted by this assay, and inadequate number of viral ?copies(<138 copies/mL). A negative result must be combined with ?clinical observations, patient history, and epidemiological ?information. The expected result is Negative. ? ?Fact Sheet for Patients:  ?BloggerCourse.com ? ?Fact  Sheet for Healthcare Providers:  ?SeriousBroker.it ? ?This test is no t yet approved or cleared by the [place] FDA and  ?has been authorized for detection and/or diagnosis

## 2021-09-28 NOTE — Progress Notes (Signed)
Patient blood sugars 267. Recheck was 292. MD notified, insulin given per MD orders.  ?

## 2021-09-28 NOTE — Progress Notes (Signed)
? ?Date of Admission:  09/26/2021    ? ?ID: Catherine AuerJamerical G [name] is a 21 y.o. female  ?Principal Problem: ?  Severe sepsis with septic shock (CODE) (HCC) ?Active Problems: ?  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  AKI (acute kidney injury) (HCC) ?  HTN (hypertension) ?  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  Abdominal pain ?  Hypokalemia ?  HLD (hyperlipidemia) ?  HCAP (healthcare-associated pneumonia) ?  Hypomagnesemia ? ? ? ?Subjective: ?Pt feeling better ?Pain abdomen resolved ?Diarrhea better than before ?Appetite improved ?No fever ? ?Medications:  ? Chlorhexidine Gluconate Cloth  6 each Topical Daily  ? heparin  5,000 Units Subcutaneous Q8H  ? insulin aspart  0-5 Units Subcutaneous QHS  ? insulin aspart  0-9 Units Subcutaneous TID WC  ? insulin aspart  3 Units Subcutaneous TID WC  ? insulin glargine-yfgn  12 Units Subcutaneous BID  ? ? ?Objective: ?Vital signs in last 24 hours: ?Patient Vitals for the past 24 hrs: ? BP Temp Temp src Pulse Resp SpO2  ?09/28/21 1000 120/82 -- -- 100 17 100 %  ?09/28/21 0958 120/82 -- -- 99 (!) 22 100 %  ?09/28/21 0800 126/84 97.9 ?F (36.6 ?C) Oral 98 (!) 33 100 %  ?09/28/21 0600 -- -- -- 98 13 97 %  ?09/28/21 0500 -- -- -- (!) 101 (!) 21 96 %  ?09/28/21 0400 116/70 98.4 ?F (36.9 ?C) Oral (!) 107 20 92 %  ?09/28/21 0300 -- -- -- (!) 108 (!) 7 98 %  ?09/28/21 0200 129/89 -- -- (!) 108 16 99 %  ?09/28/21 0100 118/75 -- -- (!) 110 15 95 %  ?09/28/21 0000 116/88 99 ?F (37.2 ?C) -- (!) 115 (!) 33 93 %  ?09/27/21 2300 118/78 -- -- (!) 115 (!) 23 96 %  ?09/27/21 2200 -- -- -- (!) 116 (!) 0 97 %  ?09/27/21 2100 -- -- -- (!) 125 (!) 23 96 %  ?09/27/21 2000 -- 99.4 ?F (37.4 ?C) Oral (!) 132 (!) 41 90 %  ?09/27/21 1800 123/81 -- -- (!) 127 (!) 47 98 %  ?09/27/21 1700 (!) 127/93 -- -- (!) 124 (!) 35 96 %  ?09/27/21 1600 131/90 98.9 ?F (37.2 ?C) Oral (!) 120 (!) 42 96 %  ?09/27/21 1500 (!) 135/93 -- -- (!) 122 (!) 27 96 %   ?09/27/21 1400 (!) 133/93 -- -- (!) 113 (!) 38 93 %  ?09/27/21 1300 (!) 133/93 -- -- (!) 109 (!) 34 94 %  ?09/27/21 1213 (!) 134/94 98.2 ?F (36.8 ?C) Oral (!) 105 (!) 26 94 %  ?  ?PHYSICAL EXAM:  ?General: Alert, cooperative, no distress, appears stated age.  ?Head: Normocephalic, without obvious abnormality, atraumatic. ?Eyes: Conjunctivae clear, anicteric sclerae. Pupils are equal ?ENT Nares normal. No drainage or sinus tenderness. ?Lips, mucosa, and tongue normal. No Thrush ?Neck: Supple, symmetrical, no adenopathy, thyroid: non tender ?no carotid bruit and no JVD. ?Back: No CVA tenderness. ?Lungs: b/l air entry- few crepts ?Heart: Regular rate and rhythm, no murmur, rub or gallop. ?Abdomen: Soft, non-tender,not distended. Bowel sounds normal. No masses ?Extremities: atraumatic, no cyanosis. No edema. No clubbing ?Skin: No rashes or lesions. Or bruising ?Lymph: Cervical, supraclavicular normal. ?Neurologic: Grossly non-focal ? ?Lab Results ?Recent Labs  ?  09/27/21 ?0404 09/28/21 ?[id number]  ?WBC 27.9* 22.7*  ?HGB 10.9* 10.3*  ?HCT 33.5* 31.1*  ?NA 139 133*  ?K 4.0 3.9  ?CL  111 108  ?CO2 22 20*  ?BUN 15 11  ?CREATININE 1.30* 1.34*  ? ?Liver Panel ?Recent Labs  ?  09/26/21 ?0437 09/27/21 ?0404  ?PROT 5.2* 4.9*  ?ALBUMIN <1.5* 1.6*  ?AST 24 96*  ?ALT 11 41  ?ALKPHOS 104 95  ?BILITOT 0.5 0.6  ? ?Sedimentation Rate ?No results for input(s): ESRSEDRATE in the last 72 hours. ?C-Reactive Protein ?No results for input(s): CRP in the last 72 hours. ? ?Microbiology: ? ?Studies/Results: ?DG Chest Port 1 View ? ?Result Date: 09/27/2021 ?CLINICAL DATA:  Acute respiratory failure, diabetes EXAM: PORTABLE CHEST 1 VIEW COMPARISON:  09/26/2021 FINDINGS: Slight worsening mid and lower lung patchy areas of airspace disease and consolidation with further obscuration of the hemidiaphragms concerning for bilateral pneumonia. Difficult to exclude small developing effusions. Stable heart size and vascularity. No pneumothorax. Trachea  midline. No acute osseous finding. IMPRESSION: Worsening patchy bilateral pneumonia pattern Electronically Signed   By: Judie Petit.  Shick M.D.   On: 09/27/2021 08:49  ? ?ECHOCARDIOGRAM COMPLETE ? ?Result Date: 09/27/2021 ?   ECHOCARDIOGRAM REPORT   Patient Name:   Catherine [name] Date of Exam: 09/27/2021 Medical Rec #:  [id number]           Height:       59.0 in Accession #:    [id number]          Weight:       132.3 lb Date of Birth:  2000/08/29           BSA:          1.547 m? Patient Age:    20 years            BP:           134/94 mmHg Patient Gender: F                   HR:           119 bpm. Exam Location:  [facility] Procedure: 2D Echo, Color Doppler and Cardiac Doppler Indications:     R06.03 Acute respiratory distress  History:         Patient has no prior history of Echocardiogram examinations.                  Arrythmias:Tachycardia; Risk Factors:Diabetes.  Sonographer:     Ceasar Mons Referring Phys:  [id number] DANA G NELSON Diagnosing Phys: Adrian Blackwater IMPRESSIONS  1. Left ventricular ejection fraction, by estimation, is 60 to 65%. The left ventricle has normal function. The left ventricle has no regional wall motion abnormalities. Left ventricular diastolic parameters were normal.  2. Right ventricular systolic function is normal. The right ventricular size is normal.  3. The mitral valve is normal in structure. Mild mitral valve regurgitation. No evidence of mitral stenosis.  4. The aortic valve is normal in structure. Aortic valve regurgitation is not visualized. No aortic stenosis is present.  5. The inferior vena cava is normal in size with greater than 50% respiratory variability, suggesting right atrial pressure of 3 mmHg. FINDINGS  Left Ventricle: Left ventricular ejection fraction, by estimation, is 60 to 65%. The left ventricle has normal function. The left ventricle has no regional wall motion abnormalities. The left ventricular internal cavity size was normal in size. There is  no left ventricular  hypertrophy. Left ventricular diastolic parameters were normal. Right Ventricle: The right ventricular size is normal. No increase in right ventricular wall thickness. Right ventricular systolic function is normal. Left Atrium: Left atrial  size was normal in size. Right Atrium: Right atrial size was normal in size. Pericardium: There is no evidence of pericardial effusion. Mitral Valve: The mitral valve is normal in structure. Mild mitral valve regurgitation. No evidence of mitral valve stenosis. MV peak gradient, 10.0 mmHg. The mean mitral valve gradient is 6.0 mmHg. Tricuspid Valve: The tricuspid valve is normal in structure. Tricuspid valve regurgitation is mild . No evidence of tricuspid stenosis. Aortic Valve: The aortic valve is normal in structure. Aortic valve regurgitation is not visualized. No aortic stenosis is present. Aortic valve mean gradient measures 5.0 mmHg. Aortic valve peak gradient measures 8.5 mmHg. Aortic valve area, by VTI measures 2.27 cm?. Pulmonic Valve: The pulmonic valve was normal in structure. Pulmonic valve regurgitation is not visualized. No evidence of pulmonic stenosis. Aorta: The aortic root is normal in size and structure. Venous: The inferior vena cava is normal in size with greater than 50% respiratory variability, suggesting right atrial pressure of 3 mmHg. IAS/Shunts: No atrial level shunt detected by color flow Doppler.  LEFT VENTRICLE PLAX 2D LVIDd:         4.02 cm   Diastology LVIDs:         2.33 cm   LV e' medial:    10.10 cm/s LV PW:         1.02 cm   LV E/e' medial:  15.8 LV IVS:        0.62 cm   LV e' lateral:   13.10 cm/s LVOT diam:     1.80 cm   LV E/e' lateral: 12.2 LV SV:         51 LV SV Index:   33 LVOT Area:     2.54 cm?  RIGHT VENTRICLE RV Basal diam:  3.43 cm RV S prime:     14.80 cm/s LEFT ATRIUM             Index        RIGHT ATRIUM           Index LA diam:        3.10 cm 2.00 cm/m?   RA Area:     12.60 cm? LA Vol (A2C):   20.7 ml 13.38 ml/m?  RA Volume:    26.40 ml  17.06 ml/m? LA Vol (A4C):   27.8 ml 17.97 ml/m? LA Biplane Vol: 24.0 ml 15.51 ml/m?  AORTIC VALVE                     PULMONIC VALVE AV Area (Vmax):    2.16 cm?      PV Vmax:       1.49 m/s AV Area (Vmea

## 2021-09-28 NOTE — Assessment & Plan Note (Signed)
Resolved with repletion. -Continue to monitor and replete as needed 

## 2021-09-28 NOTE — Assessment & Plan Note (Signed)
Resolved after repletion. ?-Monitor potassium and replete as needed ?

## 2021-09-28 NOTE — Progress Notes (Signed)
Patient ID: Catherine [name], female   DOB: 2000-12-14, 21 y.o.   MRN: [id number] ?    SURGICAL PROGRESS NOTE  ? ?Hospital Day(s): 2.  ? ?Interval History: Patient seen and examined, no acute events or new complaints overnight. Patient reports feeling better.  Patient denies abdominal pain.  Patient reports she has been tolerating diet without nausea or vomiting.  She has been having a bowel movement. ? ?Vital signs in last 24 hours: [min-max] current  ?Temp:  [97.9 ?F (36.6 ?C)-99.4 ?F (37.4 ?C)] 97.9 ?F (36.6 ?C) (03/29 0800) ?Pulse Rate:  [98-132] 100 (03/29 1000) ?Resp:  [0-47] 17 (03/29 1000) ?BP: (116-135)/(70-93) 120/82 (03/29 1000) ?SpO2:  [90 %-100 %] 100 % (03/29 1000)     Height: 4\' 11"  (149.9 cm) Weight: 60 kg BMI (Calculated): 26.7  ? ?Physical Exam:  ?Constitutional: alert, cooperative and no distress  ?Respiratory: breathing non-labored at rest  ?Cardiovascular: regular rate and sinus rhythm  ?Gastrointestinal: soft, non-tender, and non-distended ? ?Labs:  ? ?  Latest Ref Rng & Units 09/28/2021  ?  5:51 AM 09/27/2021  ?  4:04 AM 09/26/2021  ?  4:37 AM  ?CBC  ?WBC 4.0 - 10.5 K/uL 22.7   27.9   17.5    ?Hemoglobin 12.0 - 15.0 g/dL 09/28/2021   50.5   39.7    ?Hematocrit 36.0 - 46.0 % 31.1   33.5   43.1    ?Platelets 150 - 400 K/uL 252   204   336    ? ? ?  Latest Ref Rng & Units 09/28/2021  ?  5:51 AM 09/27/2021  ?  4:04 AM 09/26/2021  ?  4:37 AM  ?CMP  ?Glucose 70 - 99 mg/dL 09/28/2021   67   419    ?BUN 6 - 20 mg/dL 11   15   22     ?Creatinine 0.44 - 1.00 mg/dL 379     0.24    ?Sodium 135 - 145 mmol/L 133   139   142    ?Potassium 3.5 - 5.1 mmol/L 3.9   4.0   3.4    ?Chloride 98 - 111 mmol/L 108   111   107    ?CO2 22 - 32 mmol/L 20   22   28     ?Calcium 8.9 - 10.3 mg/dL 7.4   7.4   8.0    ?Total Protein 6.5 - 8.1 g/dL 4.2   4.9   5.2    ?Total Bilirubin 0.3 - 1.2 mg/dL 0.5   0.6   0.5    ?Alkaline Phos 38 - 126 U/L 140   95   104    ?AST 15 - 41 U/L 25   96   24    ?ALT 0 - 44 U/L 26   41   11    ? ? ?Imaging  studies: No new pertinent imaging studies ? ? ?Assessment/Plan:  ?21 y.o. female with abdominal pain, complicated by pertinent comorbidities including nephrotic syndrome, diabetes mellitus type 1, hypertension. ?  ?Patient continue without abdominal pain.  She has been tolerating regular diet.  She has not had any nausea or vomiting.  She is having bowel movement.  I have low suspicious of intra-abdominal pathology as the cause of her sepsis.  As recommended before if there is still any suspicious of intra-abdominal pathology as a source of her sepsis I would recommend CT of the abdomen and pelvis with IV contrast. ? ?Otherwise no surgical pathology  identified at this moment.  Continue medical management as per primary team and consultants.  Surgery will sign off but contact us if there is any question or concern. ?  ? ?Wilmon Arms, MD ? ? ? ?

## 2021-09-28 NOTE — Assessment & Plan Note (Signed)
Resolved. ?No more nausea or vomiting today. ?Did develop some diarrhea-GI pathogen panel sent. ?General surgery was also consulted but there is no concern of acute abdomen as CT abdomen was without any acute findings. ?-Continue to monitor ?-Supportive care ?

## 2021-09-28 NOTE — Assessment & Plan Note (Signed)
Patient met severe sepsis with septic shock criteria with fever, tachycardia, tachypnea, lactic acidosis and AKI.  She was hypotensive and required 3 IV boluses, levo was ordered but never required. ?PCCM was also consulted, now the signed off as she is currently stable. ?Blood cultures growing strep agalactiae, concern of GI or GU source. ?Urine and respiratory cultures pending. ?Chest x-ray concerning for pneumonia-on repeat there is some worsening. ?MRSA PCR negative. ?Procalcitonin started improving 65.95>> 38.51 today. ?Echocardiogram was normal ?GI pathogen panel negative ?ID consult is on board.  Zosyn was switched with Unasyn ?-Continue with Unasyn ?-Continue Zithromax ?-Current continue with supportive care. ?

## 2021-09-28 NOTE — Assessment & Plan Note (Signed)
Poorly controlled diabetes with A1c of 13.1 and hyperglycemia. ?CBG elevated. ?Patient was on Lantus 30 units daily along with NovoLog. ?-Increase Semglee to 15 units twice daily. ?-Add 3 units for mealtime coverage ?-Continue with sliding scale ?

## 2021-09-28 NOTE — Assessment & Plan Note (Signed)
Most likely with severe sepsis.  Baseline creatinine appears to be less than 1.  Also has an history of nephrotic syndrome.  Creatinine currently stable, at 1.3 today. ?Nephrology was also consulted. ?-Monitor renal function ?-Avoid nephrotoxins ?

## 2021-09-29 LAB — CBC WITH DIFFERENTIAL/PLATELET
Abs Immature Granulocytes: 0.18 10*3/uL — ABNORMAL HIGH (ref 0.00–0.07)
Basophils Absolute: 0.1 10*3/uL (ref 0.0–0.1)
Basophils Relative: 0 %
Eosinophils Absolute: 0 10*3/uL (ref 0.0–0.5)
Eosinophils Relative: 0 %
HCT: 38.4 % (ref 36.0–46.0)
Hemoglobin: 12.6 g/dL (ref 12.0–15.0)
Immature Granulocytes: 1 %
Lymphocytes Relative: 15 %
Lymphs Abs: 2.9 10*3/uL (ref 0.7–4.0)
MCH: 28.3 pg (ref 26.0–34.0)
MCHC: 32.8 g/dL (ref 30.0–36.0)
MCV: 86.1 fL (ref 80.0–100.0)
Monocytes Absolute: 0.4 10*3/uL (ref 0.1–1.0)
Monocytes Relative: 2 %
Neutro Abs: 16 10*3/uL — ABNORMAL HIGH (ref 1.7–7.7)
Neutrophils Relative %: 82 %
Platelets: 337 10*3/uL (ref 150–400)
RBC: 4.46 MIL/uL (ref 3.87–5.11)
RDW: 13.5 % (ref 11.5–15.5)
WBC: 19.7 10*3/uL — ABNORMAL HIGH (ref 4.0–10.5)
nRBC: 0 % (ref 0.0–0.2)

## 2021-09-29 LAB — GLUCOSE, CAPILLARY
Glucose-Capillary: 310 mg/dL — ABNORMAL HIGH (ref 70–99)
Glucose-Capillary: 147 mg/dL — ABNORMAL HIGH (ref 70–99)
Glucose-Capillary: 181 mg/dL — ABNORMAL HIGH (ref 70–99)
Glucose-Capillary: 275 mg/dL — ABNORMAL HIGH (ref 70–99)

## 2021-09-29 LAB — TACROLIMUS LEVEL: Tacrolimus (FK506) - LabCorp: <0.5 ng/mL — ABNORMAL LOW (ref 2.0–20.0)

## 2021-09-29 MED ORDER — TORSEMIDE 20 MG PO TABS
20.0000 mg | ORAL_TABLET | Freq: Every day | ORAL | Status: DC
  Administered 2021-09-29 – 2021-09-30 (×2): 20 mg via ORAL
  Filled 2021-09-29 (×2): qty 1

## 2021-09-29 MED ORDER — INSULIN ASPART 100 UNIT/ML IJ SOLN
5.0000 [IU] | Freq: Three times a day (TID) | INTRAMUSCULAR | Status: DC
  Administered 2021-09-29 – 2021-09-30 (×5): 5 [IU] via SUBCUTANEOUS
  Filled 2021-09-29 (×5): qty 1

## 2021-09-29 MED ORDER — POLYETHYLENE GLYCOL 3350 17 G PO PACK
17.0000 g | PACK | Freq: Every day | ORAL | Status: DC
  Administered 2021-09-29 – 2021-09-30 (×2): 17 g via ORAL
  Filled 2021-09-29 (×2): qty 1

## 2021-09-29 MED ORDER — INSULIN ASPART 100 UNIT/ML IJ SOLN
0.0000 [IU] | Freq: Three times a day (TID) | INTRAMUSCULAR | Status: DC
  Administered 2021-09-29: 2 [IU] via SUBCUTANEOUS
  Administered 2021-09-29: 3 [IU] via SUBCUTANEOUS
  Administered 2021-09-29: 11 [IU] via SUBCUTANEOUS
  Filled 2021-09-29 (×4): qty 1

## 2021-09-29 NOTE — Assessment & Plan Note (Signed)
Resolved after repletion. ?-Monitor potassium and replete as needed ?

## 2021-09-29 NOTE — Assessment & Plan Note (Signed)
Patient met severe sepsis with septic shock criteria with fever, tachycardia, tachypnea, lactic acidosis and AKI.  She was hypotensive and required 3 IV boluses, levo was ordered but never required. ?PCCM was also consulted, now the signed off as she is currently stable. ?Blood cultures growing strep agalactiae, concern of GI or GU source. ?Urine and respiratory cultures pending. ?Chest x-ray concerning for pneumonia-on repeat there is some worsening. ?MRSA PCR negative. ?Procalcitonin started improving 65.95>> 38.51 today. ?Echocardiogram was normal, no need TEE per ID. ?GI pathogen panel negative ?ID consult is on board.  Zosyn was switched with Unasyn ?-Continue with Unasyn ?-Continue Zithromax ?-Current continue with supportive care. ?

## 2021-09-29 NOTE — Progress Notes (Signed)
?Progress Note ? ? ?Patient: Catherine [name] HGD:[id number] DOB: 2001/06/17 DOA: 09/26/2021     3 ?DOS: the patient was seen and examined on 09/29/2021 ?  ?Brief hospital course: ?Taken from prior notes. ? ?Catherine [name] is a 21 y.o. female with medical history significant of type 1 diabetes, hypertension, hyperlipidemia, nephrotic syndrome on tacrolimus, who presents with abdominal pain. ?  ?Patient states that her symptoms started in the early morning at about 1 AM.  Patient has nausea, vomiting and abdominal pain.  She has vomited more than 10 times with nonbilious nonbloody vomiting.  No diarrhea.  Her abdominal pain is diffuse, severe, sharp, nonradiating.  Patient denies subjective fever, but she has temperature 102.2 in ED.  No chills. Patient reports shortness of breath, no cough, chest pain.  She had 1 episode of oxygen desaturating to 87% on room air, which improved to 98-100% on room air later on.  Denies symptoms of UTI.  Patient is lethargic, but easily arousable, oriented x3.  Moves all extremities normally. ? ?Patient was initially hypotensive with blood pressure 86/57, which improved to 103/62 after giving 2 L LR bolus, but dropped to 77/50, then back to 94/51 after given 3L of LR. Levophed is ordered if MAP < 65 again, but it was never required and blood pressure remained within goal after that.. ? ?CT abdomen was negative for any acute intra-abdominal findings.  General surgery was also consulted and they signed off as there was no concern of acute abdomen. ?Chest x-ray concerning for patchy bilateral pneumonia.  She was having cough for 3 days. ?MRSA PCR negative. ? ?Patient was initially started on broad-spectrum antibiotics due to concern of immunocompromised with tacrolimus, she received Zosyn and vancomycin.  Later blood culture came back positive for Streptococcus agalactia, vancomycin was discontinued. ?Echocardiogram ordered-pending ?ID, nephrology and PCCM was also consulted. ?PCCM  also signed off as patient is now stable and not requiring any pressors. ? ?Patient developed diarrhea today, GI pathogen panel negative. ?Respiratory viral panel negative. ?Zithromax was also added for CAP coverage. ? ?Echocardiogram was normal, no vegetations.  No need for TEE per ID. ? ?Clinically seems improving.  Zosyn was switched with Unasyn by ID. ? ?Renal duplex scan done today by nephrology was negative. ? ?3/30: Clinically stable, leukocytosis improving.  Home dose of tacrolimus was held at ID request due to significant leukocytosis.  ID would like to normalize leukocytosis before repeating blood cultures.  TTE was normal, no need for TEE per ID. ?Home Lasix was switched with torsemide by nephrology today. ? ? ? ?Assessment and Plan: ?* Severe sepsis with septic shock (CODE) ([place]) ?Patient met severe sepsis with septic shock criteria with fever, tachycardia, tachypnea, lactic acidosis and AKI.  She was hypotensive and required 3 IV boluses, levo was ordered but never required. ?PCCM was also consulted, now the signed off as she is currently stable. ?Blood cultures growing strep agalactiae, concern of GI or GU source. ?Urine and respiratory cultures pending. ?Chest x-ray concerning for pneumonia-on repeat there is some worsening. ?MRSA PCR negative. ?Procalcitonin started improving 65.95>> 38.51 today. ?Echocardiogram was normal, no need TEE per ID. ?GI pathogen panel negative ?ID consult is on board.  Zosyn was switched with Unasyn ?-Continue with Unasyn ?-Continue Zithromax ?-Current continue with supportive care. ? ?HCAP (healthcare-associated pneumonia) ?Chest x-ray concerning for right middle lower lobe pneumonia on admission, repeat x-ray done this morning with bilateral worsening opacities concerning for pneumonia.  MRSA PCR negative.  Strep pneumo antigen negative,  Legionella negative ?-Continue with Unasyn ?-Continue Zithromax ?-Continue with supportive care ? ?AKI (acute kidney injury)  ([place]) ?Most likely with severe sepsis.  Baseline creatinine appears to be less than 1.  Also has an history of nephrotic syndrome.  Creatinine currently stable, at 1.3. ?Nephrology was also consulted. ?-Monitor renal function ?-Avoid nephrotoxins ? 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 ?-Nephrology advised to hold tacrolimus until infection is clear. ?-She was started on prednisone by nephrology ?-Home Lasix was switched with tacrolimus today. ?-Per nephrology she might get benefit from a repeat kidney biopsy to see if it is minimal-change or diabetic nephropathy as management will be different. ?-Patient follow-up with pediatric nephrology at [facility]. ? ?Abdominal pain ?Resolved.  Unclear etiology. ?No more nausea or vomiting today. ?Did develop some diarrhea-GI pathogen panel sent. ?General surgery was also consulted but there is no concern of acute abdomen as CT abdomen was without any acute findings. ?-Continue to monitor ?-Supportive care ? ?HTN (hypertension) ?Patient presented initially with hypotension secondary to septic shock which has been resolved now.  Blood pressure now within goal.  We initially held home Lasix. ?-Nephrology switched her home Lasix with torsemide today ?-Continue to monitor ? 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 ?CBG remained elevated, despite increasing Semglee yesterday ?Patient was on Lantus 30 units daily along with NovoLog. ?-Continue Semglee at 15 units twice daily. ?-Increase mealtime coverage to 5 units ?-Discontinue sensitive sliding scale and start her on moderate scale ? ?Hypokalemia ?Resolved after repletion. ?-Monitor potassium and replete as needed ? ?Hypomagnesemia ?Resolved with repletion. ?-Continue to monitor and replete as needed ? ?HLD (hyperlipidemia) ?- Continue with Lipitor ? ?  ? ?Subjective:  Patient was feeling some abdominal bloatedness, denies any pain.  Had a small bowel movement yesterday. ? ?Physical Exam: ?Vitals:  ? 09/28/21 2041 09/29/21 0546 09/29/21 0720 09/29/21 1607  ?BP: 113/80 106/77 94/74 114/83  ?Pulse: (!) 102 83 80 90  ?Resp: 20 18 14 16   ?Temp: (!) 97.4 ?F (36.3 ?C) (!) 97.5 ?F (36.4 ?C) 97.6 ?F (36.4 ?C) 97.8 ?F (36.6 ?C)  ?TempSrc: Oral Oral Oral   ?SpO2: 100% 99% 100% 100%  ?Weight:      ?Height:      ? ?General.  Young lady, in no acute distress. ?Pulmonary.  Lungs clear bilaterally, normal respiratory effort. ?CV.  Regular rate and rhythm, no JVD, rub or murmur. ?Abdomen.  Soft, nontender, nondistended, BS positive. ?CNS.  Alert and oriented x3.  No focal neurologic deficit. ?Extremities.  No edema, no cyanosis, pulses intact and symmetrical. ?Psychiatry.  Judgment and insight appears normal. ? ?Data Reviewed: ?Prior notes and labs reviewed ? ?Family Communication:  ? ?Disposition: ?Status is: Inpatient ?Remains inpatient appropriate because: Severity of illness ? ? Planned Discharge Destination: Home ? ?DVT prophylaxis.  Subcu heparin ? ?Time spent: 50 minutes ? ?This record has been created using Systems analyst. Errors have been sought and corrected,but may not always be located. Such creation errors do not reflect on the standard of care. ? ?Author: ?Lorella Nimrod, MD ?09/29/2021 4:22 PM ? ?For on call review [email or web address].  ?

## 2021-09-29 NOTE — Assessment & Plan Note (Signed)
Poorly controlled diabetes with A1c of 13.1 and hyperglycemia. ?CBG remained elevated, despite increasing Semglee yesterday ?Patient was on Lantus 30 units daily along with NovoLog. ?-Continue Semglee at 15 units twice daily. ?-Increase mealtime coverage to 5 units ?-Discontinue sensitive sliding scale and start her on moderate scale ?

## 2021-09-29 NOTE — Assessment & Plan Note (Signed)
Most likely with severe sepsis.  Baseline creatinine appears to be less than 1.  Also has an history of nephrotic syndrome.  Creatinine currently stable, at 1.3. ?Nephrology was also consulted. ?-Monitor renal function ?-Avoid nephrotoxins ?

## 2021-09-29 NOTE — Assessment & Plan Note (Signed)
Chest x-ray concerning for right middle lower lobe pneumonia on admission, repeat x-ray done this morning with bilateral worsening opacities concerning for pneumonia.  MRSA PCR negative.  Strep pneumo antigen negative, Legionella negative ?-Continue with Unasyn ?-Continue Zithromax ?-Continue with supportive care ?

## 2021-09-29 NOTE — Assessment & Plan Note (Signed)
Patient presented initially with hypotension secondary to septic shock which has been resolved now.  Blood pressure now within goal.  We initially held home Lasix. ?-Nephrology switched her home Lasix with torsemide today ?-Continue to monitor ?

## 2021-09-29 NOTE — Assessment & Plan Note (Signed)
Resolved.  Unclear etiology. ?No more nausea or vomiting today. ?Did develop some diarrhea-GI pathogen panel sent. ?General surgery was also consulted but there is no concern of acute abdomen as CT abdomen was without any acute findings. ?-Continue to monitor ?-Supportive care ?

## 2021-09-29 NOTE — Assessment & Plan Note (Signed)
Nephrology was consulted. ?-Nephrology advised to hold tacrolimus until infection is clear. ?-She was started on prednisone by nephrology ?-Home Lasix was switched with tacrolimus today. ?-Per nephrology she might get benefit from a repeat kidney biopsy to see if it is minimal-change or diabetic nephropathy as management will be different. ?-Patient follow-up with pediatric nephrology at [facility]. ?

## 2021-09-29 NOTE — Progress Notes (Signed)
[facility] ?[place], [place] ?09/29/21 ? ?Subjective:  ? ?Hospital day # 3 ?Patient presented to the emergency room for mid abdominal pain, radiating to her back and vomiting.  She is currently admitted to ICU for hypotension and sepsis.  Requiring oxygen by nasal cannula.  Initially blood pressure was in the 80s upon arrival.  Was given IV fluid boluses. ? ?Update ?Patient seen resting quietly in bed, states she is tired this morning ?Alert and oriented ?Denies nausea and vomiting with meals ?Remains on room air ?No lower extremity edema ? ? ?Renal: ?03/29 0701 - 03/30 0700 ?In: 481.2 [P.O.:120; IV Piggyback:361.2] ?Out: -  ?Lab Results  ?Component Value Date  ? CREATININE 1.34 (H) 09/28/2021  ? CREATININE 1.30 (H) 09/27/2021  ? CREATININE 1.48 (H) 09/26/2021  ? ? ? ?Objective:  ?Vital signs in last 24 hours:  ?Temp:  [97.4 ?F (36.3 ?C)-98.1 ?F (36.7 ?C)] 97.6 ?F (36.4 ?C) (03/30 0720) ?Pulse Rate:  [80-102] 80 (03/30 0720) ?Resp:  [10-20] 14 (03/30 0720) ?BP: (94-121)/(68-86) 94/74 (03/30 0720) ?SpO2:  [99 %-100 %] 100 % (03/30 0720) ? ?Weight change:  ?Filed Weights  ? 09/26/21 0409  ?Weight: 60 kg  ? ? ?Intake/Output: ?  ? ?Intake/Output Summary (Last 24 hours) at 09/29/2021 1212 ?Last data filed at 09/29/2021 1032 ?Gross per 24 hour  ?Intake 601.15 ml  ?Output --  ?Net 601.15 ml  ? ? ? ? ?Physical Exam: ?General: NAD, laying in the bed  ?HEENT [place] O2, moist oral mucous membranes  ?Pulm/lungs Clear breath sounds, normal effort  ?CVS/Heart Regular rate and rhythm  ?Abdomen:  Soft, nontender  ?Extremities: No peripheral edema  ?Neurologic: Alert, able to answer simple questions  ?Skin: Warm, dry  ?   ?   ? ? ?Basic Metabolic Panel: ? ?Recent Labs  ?Lab 09/26/21 ?0437 09/26/21 ?0810 09/27/21 ?0404 09/28/21 ?[id number]  ?NA 142  --  139 133*  ?K 3.4*  --  4.0 3.9  ?CL 107  --  111 108  ?CO2 28  --  22 20*  ?GLUCOSE 125*  --  67* 293*  ?BUN 22*  --  15 11  ?CREATININE 1.48*  --  1.30* 1.34*  ?CALCIUM 8.0*   --  7.4* 7.4*  ?MG  --  1.5* 2.2  --   ?PHOS  --   --  4.0  --   ? ? ? ? ?CBC: ?Recent Labs  ?Lab 09/26/21 ?0437 09/27/21 ?0404 09/28/21 ?[id number] 09/29/21 ?0901  ?WBC 17.5* 27.9* 22.7* 19.7*  ?NEUTROABS 15.1*  --  18.0* 16.0*  ?HGB 14.0 10.9* 10.3* 12.6  ?HCT 43.1 33.5* 31.1* 38.4  ?MCV 86.7 88.6 86.4 86.1  ?PLT 336 204 252 337  ? ? ? ?  ?Lab Results  ?Component Value Date  ? HEPBSAG NON REACTIVE 09/04/2021  ? HEPBIGM NON REACTIVE 09/04/2021  ? ? ? ? ?Microbiology: ? ?Recent Results (from the past 240 hour(s))  ?Urine Culture     Status: Abnormal  ? Collection Time: 09/26/21  5:18 AM  ? Specimen: Urine, Clean Catch  ?Result Value Ref Range Status  ? Specimen Description URINE, CLEAN CATCH  Final  ? Special Requests NONE  Final  ? Culture (A)  Final  ?  4,000 COLONIES/mL GROUP B STREP(S.AGALACTIAE)ISOLATED ?TESTING AGAINST S. AGALACTIAE NOT ROUTINELY PERFORMED DUE TO PREDICTABILITY OF AMP/PEN/VAN SUSCEPTIBILITY. ?  ? Report Status 09/27/2021 FINAL  Final  ?Blood Culture (routine x 2)     Status: Abnormal  ? Collection Time: 09/26/21  8:10 AM  ? Specimen: BLOOD  ?Result Value Ref Range Status  ? Specimen Description   Final  ?  BLOOD LEFT ANTECUBITAL ?Performed at [facility], [address] [place], [place] [address] ?  ? Special Requests   Final  ?  BOTTLES DRAWN AEROBIC AND ANAEROBIC Blood Culture results may not be optimal due to an excessive volume of blood received in culture bottles ?Performed at [facility], [address], [place], [place] [address] ?  ? Culture  Setup Time   Final  ?  GRAM POSITIVE COCCI ?ANAEROBIC BOTTLE ONLY ?Organism ID to follow ?CRITICAL RESULT CALLED TO, READ BACK BY AND VERIFIED WITH: ALEX CHAPPELL 2001 09/26/21 MU ?  ? Culture GROUP B STREP(S.AGALACTIAE)ISOLATED (A)  Final  ? Report Status 09/28/2021 FINAL  Final  ? Organism ID, Bacteria GROUP B STREP(S.AGALACTIAE)ISOLATED  Final  ?    Susceptibility  ? Group b strep(s.agalactiae)isolated - MIC*  ?  CLINDAMYCIN  <=0.25 SENSITIVE Sensitive   ?  AMPICILLIN <=0.25 SENSITIVE Sensitive   ?  ERYTHROMYCIN <=0.12 SENSITIVE Sensitive   ?  VANCOMYCIN 0.5 SENSITIVE Sensitive   ?  CEFTRIAXONE <=0.12 SENSITIVE Sensitive   ?  LEVOFLOXACIN 1 SENSITIVE Sensitive   ?  * GROUP B STREP(S.AGALACTIAE)ISOLATED  ?Blood Culture ID Panel (Reflexed)     Status: Abnormal  ? Collection Time: 09/26/21  8:10 AM  ?Result Value Ref Range Status  ? Enterococcus faecalis NOT DETECTED NOT DETECTED Final  ? Enterococcus Faecium NOT DETECTED NOT DETECTED Final  ? Listeria monocytogenes NOT DETECTED NOT DETECTED Final  ? Staphylococcus species NOT DETECTED NOT DETECTED Final  ? Staphylococcus aureus (BCID) NOT DETECTED NOT DETECTED Final  ? Staphylococcus epidermidis NOT DETECTED NOT DETECTED Final  ? Staphylococcus lugdunensis NOT DETECTED NOT DETECTED Final  ? Streptococcus species DETECTED (A) NOT DETECTED Final  ?  Comment: CRITICAL RESULT CALLED TO, READ BACK BY AND VERIFIED WITH: ?ALEC CHAPPELL 2001 09/26/21 MU ?  ? Streptococcus agalactiae DETECTED (A) NOT DETECTED Final  ?  Comment: CRITICAL RESULT CALLED TO, READ BACK BY AND VERIFIED WITH: ?ALEX CHAPPELL 2001 09/26/21 MU ?  ? Streptococcus pneumoniae NOT DETECTED NOT DETECTED Final  ? Streptococcus pyogenes NOT DETECTED NOT DETECTED Final  ? A.calcoaceticus-baumannii NOT DETECTED NOT DETECTED Final  ? Bacteroides fragilis NOT DETECTED NOT DETECTED Final  ? Enterobacterales NOT DETECTED NOT DETECTED Final  ? Enterobacter cloacae complex NOT DETECTED NOT DETECTED Final  ? Escherichia coli NOT DETECTED NOT DETECTED Final  ? Klebsiella aerogenes NOT DETECTED NOT DETECTED Final  ? Klebsiella oxytoca NOT DETECTED NOT DETECTED Final  ? Klebsiella pneumoniae NOT DETECTED NOT DETECTED Final  ? Proteus species NOT DETECTED NOT DETECTED Final  ? Salmonella species NOT DETECTED NOT DETECTED Final  ? Serratia marcescens NOT DETECTED NOT DETECTED Final  ? Haemophilus influenzae NOT DETECTED NOT DETECTED Final  ?  Neisseria meningitidis NOT DETECTED NOT DETECTED Final  ? Pseudomonas aeruginosa NOT DETECTED NOT DETECTED Final  ? Stenotrophomonas maltophilia NOT DETECTED NOT DETECTED Final  ? Candida albicans NOT DETECTED NOT DETECTED Final  ? Candida auris NOT DETECTED NOT DETECTED Final  ? Candida glabrata NOT DETECTED NOT DETECTED Final  ? Candida krusei NOT DETECTED NOT DETECTED Final  ? Candida parapsilosis NOT DETECTED NOT DETECTED Final  ? Candida tropicalis NOT DETECTED NOT DETECTED Final  ? Cryptococcus neoformans/gattii NOT DETECTED NOT DETECTED Final  ?  Comment: Performed at [facility], [address], [place], [place] [address]  ?Blood  Culture (routine x 2)     Status: None (Preliminary result)  ? Collection Time: 09/26/21  8:11 AM  ? Specimen: BLOOD  ?Result Value Ref Range Status  ? Specimen Description BLOOD BLOOD LEFT WRIST  Final  ? Special Requests   Final  ?  BOTTLES DRAWN AEROBIC AND ANAEROBIC Blood Culture adequate volume  ? Culture   Final  ?  NO GROWTH 3 DAYS ?Performed at [facility], [address] [place] [address], [place], [place] [address] ?  ? Report Status PENDING  Incomplete  ?Resp Panel by RT-PCR (Flu A&B, Covid) Nasopharyngeal Swab     Status: None  ? Collection Time: 09/26/21  8:11 AM  ? Specimen: Nasopharyngeal Swab; Nasopharyngeal(NP) swabs in vial transport medium  ?Result Value Ref Range Status  ? SARS Coronavirus 2 by RT PCR NEGATIVE NEGATIVE Final  ?  Comment: (NOTE) ?SARS-CoV-2 target nucleic acids are NOT DETECTED. ? ?The SARS-CoV-2 RNA is generally detectable in upper respiratory ?specimens during the acute phase of infection. The lowest ?concentration of SARS-CoV-2 viral copies this assay can detect is ?138 copies/mL. A negative result does not preclude SARS-Cov-2 ?infection and should not be used as the sole basis for treatment or ?other patient management decisions. A negative result may occur with  ?improper specimen collection/handling, submission of specimen other ?than  nasopharyngeal swab, presence of viral mutation(s) within the ?areas targeted by this assay, and inadequate number of viral ?copies(<138 copies/mL). A negative result must be combined with ?clinical observatio

## 2021-09-29 NOTE — Progress Notes (Signed)
? ?Date of Admission:  09/26/2021    ? ?ID: Catherine [name] is a 21 y.o. female  ?Principal Problem: ?  Severe sepsis with septic shock (CODE) (HCC) ?Active Problems: ?  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  AKI (acute kidney injury) (HCC) ?  HTN (hypertension) ?  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  Abdominal pain ?  Hypokalemia ?  HLD (hyperlipidemia) ?  HCAP (healthcare-associated pneumonia) ?  Hypomagnesemia ? ? ? ?Subjective: ?Pt feeling better ?C/o abdominal tightness and distension ?Fluid overload ? ?Medications:  ? heparin  5,000 Units Subcutaneous Q8H  ? insulin aspart  0-15 Units Subcutaneous TID WC  ? insulin aspart  0-5 Units Subcutaneous QHS  ? insulin aspart  5 Units Subcutaneous TID WC  ? insulin glargine-yfgn  15 Units Subcutaneous BID  ? predniSONE  40 mg Oral Q breakfast  ? tacrolimus  2 mg Oral BID  ? ? ?Objective: ?Vital signs in last 24 hours: ?Patient Vitals for the past 24 hrs: ? BP Temp Temp src Pulse Resp SpO2  ?09/29/21 0720 94/74 97.6 ?F (36.4 ?C) Oral 80 14 100 %  ?09/29/21 0546 106/77 (!) 97.5 ?F (36.4 ?C) Oral 83 18 99 %  ?09/28/21 2041 113/80 (!) 97.4 ?F (36.3 ?C) Oral (!) 102 20 100 %  ?09/28/21 1525 121/86 98.1 ?F (36.7 ?C) Oral (!) 102 19 100 %  ?  ?PHYSICAL EXAM:  ?General: Alert, cooperative, no distress, appears stated age.  ?Head: Normocephalic, without obvious abnormality, atraumatic. ?Eyes: Conjunctivae clear, anicteric sclerae. Pupils are equal ?ENT Nares normal. No drainage or sinus tenderness. ?Lips, mucosa, and tongue normal. No Thrush ?Neck: Supple, symmetrical, no adenopathy, thyroid: non tender ?no carotid bruit and no JVD. ?Back: No CVA tenderness. ?Lungs: b/l air entry- few crepts ?Heart: Regular rate and rhythm, no murmur, rub or gallop. ?Abdomen: Soft, distended ?Extremities: edema ankles ?Skin: No rashes or lesions. Or bruising ?Lymph: Cervical, supraclavicular normal. ?Neurologic:  Grossly non-focal ? ?Lab Results ?Recent Labs  ?  09/27/21 ?0404 09/28/21 ?4235 09/29/21 ?0901  ?WBC 27.9* 22.7* 19.7*  ?HGB 10.9* 10.3* 12.6  ?HCT 33.5* 31.1* 38.4  ?NA 139 133*  --   ?K 4.0 3.9  --   ?CL 111 108  --   ?CO2 22 20*  --   ?BUN 15 11  --   ?CREATININE 1.30* 1.34*  --   ? ?Liver Panel ?Recent Labs  ?  09/27/21 ?0404 09/28/21 ?3614  ?PROT 4.9* 4.2*  ?ALBUMIN 1.6* <1.5*  ?AST 96* 25  ?ALT 41 26  ?ALKPHOS 95 140*  ?BILITOT 0.6 0.5  ?BILIDIR  --  <0.1  ?IBILI  --  NOT CALCULATED  ? ?Microbiology: ?1 of 4 GBS ?Studies/Results: ?ECHOCARDIOGRAM COMPLETE ? ?Result Date: 09/27/2021 ?   ECHOCARDIOGRAM REPORT   Patient Name:   Catherine [name] Date of Exam: 09/27/2021 Medical Rec #:  [id number]           Height:       59.0 in Accession #:    [id number]          Weight:       132.3 lb Date of Birth:  2001-04-26           BSA:          1.547 m? Patient Age:    20 years            BP:  134/94 mmHg Patient Gender: F                   HR:           119 bpm. Exam Location:  [facility] Procedure: 2D Echo, Color Doppler and Cardiac Doppler Indications:     R06.03 Acute respiratory distress  History:         Patient has no prior history of Echocardiogram examinations.                  Arrythmias:Tachycardia; Risk Factors:Diabetes.  Sonographer:     Ceasar Mons Referring Phys:  [id number] DANA G NELSON Diagnosing Phys: Adrian Blackwater IMPRESSIONS  1. Left ventricular ejection fraction, by estimation, is 60 to 65%. The left ventricle has normal function. The left ventricle has no regional wall motion abnormalities. Left ventricular diastolic parameters were normal.  2. Right ventricular systolic function is normal. The right ventricular size is normal.  3. The mitral valve is normal in structure. Mild mitral valve regurgitation. No evidence of mitral stenosis.  4. The aortic valve is normal in structure. Aortic valve regurgitation is not visualized. No aortic stenosis is present.  5. The inferior vena cava is normal in size  with greater than 50% respiratory variability, suggesting right atrial pressure of 3 mmHg. FINDINGS  Left Ventricle: Left ventricular ejection fraction, by estimation, is 60 to 65%. The left ventricle has normal function. The left ventricle has no regional wall motion abnormalities. The left ventricular internal cavity size was normal in size. There is  no left ventricular hypertrophy. Left ventricular diastolic parameters were normal. Right Ventricle: The right ventricular size is normal. No increase in right ventricular wall thickness. Right ventricular systolic function is normal. Left Atrium: Left atrial size was normal in size. Right Atrium: Right atrial size was normal in size. Pericardium: There is no evidence of pericardial effusion. Mitral Valve: The mitral valve is normal in structure. Mild mitral valve regurgitation. No evidence of mitral valve stenosis. MV peak gradient, 10.0 mmHg. The mean mitral valve gradient is 6.0 mmHg. Tricuspid Valve: The tricuspid valve is normal in structure. Tricuspid valve regurgitation is mild . No evidence of tricuspid stenosis. Aortic Valve: The aortic valve is normal in structure. Aortic valve regurgitation is not visualized. No aortic stenosis is present. Aortic valve mean gradient measures 5.0 mmHg. Aortic valve peak gradient measures 8.5 mmHg. Aortic valve area, by VTI measures 2.27 cm?. Pulmonic Valve: The pulmonic valve was normal in structure. Pulmonic valve regurgitation is not visualized. No evidence of pulmonic stenosis. Aorta: The aortic root is normal in size and structure. Venous: The inferior vena cava is normal in size with greater than 50% respiratory variability, suggesting right atrial pressure of 3 mmHg. IAS/Shunts: No atrial level shunt detected by color flow Doppler.  LEFT VENTRICLE PLAX 2D LVIDd:         4.02 cm   Diastology LVIDs:         2.33 cm   LV e' medial:    10.10 cm/s LV PW:         1.02 cm   LV E/e' medial:  15.8 LV IVS:        0.62 cm   LV  e' lateral:   13.10 cm/s LVOT diam:     1.80 cm   LV E/e' lateral: 12.2 LV SV:         51 LV SV Index:   33 LVOT Area:     2.54 cm?  RIGHT  VENTRICLE RV Basal diam:  3.43 cm RV S prime:     14.80 cm/s LEFT ATRIUM             Index        RIGHT ATRIUM           Index LA diam:        3.10 cm 2.00 cm/m?   RA Area:     12.60 cm? LA Vol (A2C):   20.7 ml 13.38 ml/m?  RA Volume:   26.40 ml  17.06 ml/m? LA Vol (A4C):   27.8 ml 17.97 ml/m? LA Biplane Vol: 24.0 ml 15.51 ml/m?  AORTIC VALVE                     PULMONIC VALVE AV Area (Vmax):    2.16 cm?      PV Vmax:       1.49 m/s AV Area (Vmean):   2.08 cm?      PV Vmean:      83.500 cm/s AV Area (VTI):     2.27 cm?      PV VTI:        0.194 m AV Vmax:           146.00 cm/s   PV Peak grad:  8.9 mmHg AV Vmean:          109.000 cm/s  PV Mean grad:  3.0 mmHg AV VTI:            0.224 m AV Peak Grad:      8.5 mmHg AV Mean Grad:      5.0 mmHg LVOT Vmax:         124.00 cm/s LVOT Vmean:        89.300 cm/s LVOT VTI:          0.200 m LVOT/AV VTI ratio: 0.89  AORTA Ao Root diam: 2.60 cm MITRAL VALVE                TRICUSPID VALVE MV Area (PHT): 8.82 cm?     TR Peak grad:   43.6 mmHg MV Area VTI:   1.99 cm?     TR Vmax:        330.00 cm/s MV Peak grad:  10.0 mmHg MV Mean grad:  6.0 mmHg     SHUNTS MV Vmax:       1.58 m/s     Systemic VTI:  0.20 m MV Vmean:      115.0 cm/s   Systemic Diam: 1.80 cm MV Decel Time: 86 msec MV E velocity: 160.00 cm/s MV A velocity: 137.00 cm/s MV E/A ratio:  1.17 Shaukat Khan Electronically signed by ShaukaAdrian Blackwatert Khan Signature Date/Time: 09/27/2021/3:46:25 PM    Final   ? ?US RENAL ARTERY DUPLEX LIMITED ? ?Result Date: 09/28/2021 ?CLINICAL DATA:  21 year old female admitted for pneumonia, group B Streptococcus bacteremia, possible renal vein thrombosis EXAM: RENAL/URINARY TRACT ULTRASOUND RENAL DUPLEX ULTRASOUND COMPARISON:  None. FINDINGS: Right Kidney: Length: 10.4 cm. Echogenicity similar to that of the adjacent liver. No hydronephrosis. Flow in the hilum. Left  Kidney: Length: 11.8 cm. Symmetric echogenicity to the right kidney. No hydronephrosis. Flow in the hilum. Bladder:  Unremarkable RENAL DUPLEX ULTRASOUND Right Renal Artery Velocities: Origin:  56 cm/sec Mid

## 2021-09-30 DIAGNOSIS — E8809 Other disorders of plasma-protein metabolism, not elsewhere classified: Secondary | ICD-10-CM

## 2021-09-30 DIAGNOSIS — R6521 Severe sepsis with septic shock: Secondary | ICD-10-CM

## 2021-09-30 DIAGNOSIS — E119 Type 2 diabetes mellitus without complications: Secondary | ICD-10-CM

## 2021-09-30 LAB — RENAL FUNCTION PANEL
Albumin: <1.5 g/dL — ABNORMAL LOW (ref 3.5–5.0)
Anion gap: 11 (ref 5–15)
BUN: 32 mg/dL — ABNORMAL HIGH (ref 6–20)
CO2: 21 mmol/L — ABNORMAL LOW (ref 22–32)
Calcium: 7.9 mg/dL — ABNORMAL LOW (ref 8.9–10.3)
Chloride: 105 mmol/L (ref 98–111)
Creatinine, Ser: 1.53 mg/dL — ABNORMAL HIGH (ref 0.44–1.00)
GFR, Estimated: 49 mL/min — ABNORMAL LOW (ref >60)
Glucose, Bld: 111 mg/dL — ABNORMAL HIGH (ref 70–99)
Phosphorus: 3.2 mg/dL (ref 2.5–4.6)
Potassium: 3.3 mmol/L — ABNORMAL LOW (ref 3.5–5.1)
Sodium: 137 mmol/L (ref 135–145)

## 2021-09-30 LAB — BASIC METABOLIC PANEL
Anion gap: 8 (ref 5–15)
BUN: 33 mg/dL — ABNORMAL HIGH (ref 6–20)
CO2: 23 mmol/L (ref 22–32)
Calcium: 7.9 mg/dL — ABNORMAL LOW (ref 8.9–10.3)
Chloride: 108 mmol/L (ref 98–111)
Creatinine, Ser: 1.59 mg/dL — ABNORMAL HIGH (ref 0.44–1.00)
GFR, Estimated: 47 mL/min — ABNORMAL LOW (ref >60)
Glucose, Bld: 113 mg/dL — ABNORMAL HIGH (ref 70–99)
Potassium: 3.5 mmol/L (ref 3.5–5.1)
Sodium: 139 mmol/L (ref 135–145)

## 2021-09-30 LAB — CBC
HCT: 37.5 % (ref 36.0–46.0)
Hemoglobin: 12.5 g/dL (ref 12.0–15.0)
MCH: 28.7 pg (ref 26.0–34.0)
MCHC: 33.3 g/dL (ref 30.0–36.0)
MCV: 86 fL (ref 80.0–100.0)
Platelets: 393 10*3/uL (ref 150–400)
RBC: 4.36 MIL/uL (ref 3.87–5.11)
RDW: 13.4 % (ref 11.5–15.5)
WBC: 11.3 10*3/uL — ABNORMAL HIGH (ref 4.0–10.5)
nRBC: 0 % (ref 0.0–0.2)

## 2021-09-30 LAB — GLUCOSE, CAPILLARY
Glucose-Capillary: 95 mg/dL (ref 70–99)
Glucose-Capillary: 107 mg/dL — ABNORMAL HIGH (ref 70–99)

## 2021-09-30 LAB — MAGNESIUM: Magnesium: 1.9 mg/dL (ref 1.7–2.4)

## 2021-09-30 LAB — PHOSPHORUS: Phosphorus: 3.3 mg/dL (ref 2.5–4.6)

## 2021-09-30 LAB — TACROLIMUS LEVEL: Tacrolimus (FK506) - LabCorp: <0.5 ng/mL — ABNORMAL LOW (ref 2.0–20.0)

## 2021-09-30 MED ORDER — AMOXICILLIN-POT CLAVULANATE 875-125 MG PO TABS: 1.0000 | ORAL_TABLET | Freq: Two times a day (BID) | ORAL | 0 refills | Status: AC

## 2021-09-30 MED ORDER — PREDNISONE 20 MG PO TABS: 40.0000 mg | ORAL_TABLET | Freq: Every day | ORAL | 0 refills | Status: AC

## 2021-09-30 MED ORDER — TORSEMIDE 20 MG PO TABS: 20.0000 mg | ORAL_TABLET | Freq: Every day | ORAL | 0 refills | Status: DC

## 2021-09-30 NOTE — Progress Notes (Addendum)
[facility] ?, [place] ?09/30/21 ? ?Subjective:  ? ?Hospital day # 4 ?Patient presented to the emergency room for mid abdominal pain, radiating to her back and vomiting.  She is currently admitted to ICU for hypotension and sepsis.  Requiring oxygen by nasal cannula.  Initially blood pressure was in the 80s upon arrival.  Was given IV fluid boluses. ? ?Update ?Patient sitting at side of bed ?Alert and oriented ?Tolerating meals without nausea and vomiting ? ? ?Renal: ?03/30 0701 - 03/31 0700 ?In: 360 [P.O.:360] ?Out: -  ?Lab Results  ?Component Value Date  ? CREATININE 1.34 (H) 09/28/2021  ? CREATININE 1.30 (H) 09/27/2021  ? CREATININE 1.48 (H) 09/26/2021  ? ? ? ?Objective:  ?Vital signs in last 24 hours:  ?Temp:  [97.5 ?F (36.4 ?C)-98.5 ?F (36.9 ?C)] 97.5 ?F (36.4 ?C) (03/31 0746) ?Pulse Rate:  [73-92] 77 (03/31 0746) ?Resp:  [16-22] 16 (03/31 0746) ?BP: (113-126)/(80-91) 118/87 (03/31 0746) ?SpO2:  [99 %-100 %] 99 % (03/31 0746) ? ?Weight change:  ?Filed Weights  ? 09/26/21 0409  ?Weight: 60 kg  ? ? ?Intake/Output: ?  ? ?Intake/Output Summary (Last 24 hours) at 09/30/2021 1142 ?Last data filed at 09/29/2021 1412 ?Gross per 24 hour  ?Intake 240 ml  ?Output --  ?Net 240 ml  ? ? ? ? ?Physical Exam: ?General: NAD, laying in the bed  ?HEENT [place] O2, moist oral mucous membranes  ?Pulm/lungs Clear breath sounds, normal effort  ?CVS/Heart Regular rate and rhythm  ?Abdomen:  Soft, nontender  ?Extremities: No peripheral edema  ?Neurologic: Alert, able to answer simple questions  ?Skin: Warm, dry  ?   ?   ? ? ?Basic Metabolic Panel: ? ?Recent Labs  ?Lab 09/26/21 ?0437 09/26/21 ?0810 09/27/21 ?0404 09/28/21 ?7001  ?NA 142  --  139 133*  ?K 3.4*  --  4.0 3.9  ?CL 107  --  111 108  ?CO2 28  --  22 20*  ?GLUCOSE 125*  --  67* 293*  ?BUN 22*  --  15 11  ?CREATININE 1.48*  --  1.30* 1.34*  ?CALCIUM 8.0*  --  7.4* 7.4*  ?MG  --  1.5* 2.2  --   ?PHOS  --   --  4.0  --   ? ? ? ? ?CBC: ?Recent Labs  ?Lab  09/26/21 ?0437 09/27/21 ?0404 09/28/21 ?7494 09/29/21 ?0901  ?WBC 17.5* 27.9* 22.7* 19.7*  ?NEUTROABS 15.1*  --  18.0* 16.0*  ?HGB 14.0 10.9* 10.3* 12.6  ?HCT 43.1 33.5* 31.1* 38.4  ?MCV 86.7 88.6 86.4 86.1  ?PLT 336 204 252 337  ? ? ? ?  ?Lab Results  ?Component Value Date  ? HEPBSAG NON REACTIVE 09/04/2021  ? HEPBIGM NON REACTIVE 09/04/2021  ? ? ? ? ?Microbiology: ? ?Recent Results (from the past 240 hour(s))  ?Urine Culture     Status: Abnormal  ? Collection Time: 09/26/21  5:18 AM  ? Specimen: Urine, Clean Catch  ?Result Value Ref Range Status  ? Specimen Description URINE, CLEAN CATCH  Final  ? Special Requests NONE  Final  ? Culture (A)  Final  ?  4,000 COLONIES/mL GROUP B STREP(S.AGALACTIAE)ISOLATED ?TESTING AGAINST S. AGALACTIAE NOT ROUTINELY PERFORMED DUE TO PREDICTABILITY OF AMP/PEN/VAN SUSCEPTIBILITY. ?  ? Report Status 09/27/2021 FINAL  Final  ?Blood Culture (routine x 2)     Status: Abnormal  ? Collection Time: 09/26/21  8:10 AM  ? Specimen: BLOOD  ?Result Value Ref Range Status  ? Specimen  Description   Final  ?  BLOOD LEFT ANTECUBITAL ?Performed at [facility], [address] [place], [place] [address] ?  ? Special Requests   Final  ?  BOTTLES DRAWN AEROBIC AND ANAEROBIC Blood Culture results may not be optimal due to an excessive volume of blood received in culture bottles ?Performed at [facility], [address] [place], [place] [address] ?  ? Culture  Setup Time   Final  ?  GRAM POSITIVE COCCI ?ANAEROBIC BOTTLE ONLY ?Organism ID to follow ?CRITICAL RESULT CALLED TO, READ BACK BY AND VERIFIED WITH: ALEX CHAPPELL 2001 09/26/21 MU ?  ? Culture GROUP B STREP(S.AGALACTIAE)ISOLATED (A)  Final  ? Report Status 09/28/2021 FINAL  Final  ? Organism ID, Bacteria GROUP B STREP(S.AGALACTIAE)ISOLATED  Final  ?    Susceptibility  ? Group b strep(s.agalactiae)isolated - MIC*  ?  CLINDAMYCIN <=0.25 SENSITIVE Sensitive   ?  AMPICILLIN <=0.25 SENSITIVE Sensitive   ?  ERYTHROMYCIN <=0.12  SENSITIVE Sensitive   ?  VANCOMYCIN 0.5 SENSITIVE Sensitive   ?  CEFTRIAXONE <=0.12 SENSITIVE Sensitive   ?  LEVOFLOXACIN 1 SENSITIVE Sensitive   ?  * GROUP B STREP(S.AGALACTIAE)ISOLATED  ?Blood Culture ID Panel (Reflexed)     Status: Abnormal  ? Collection Time: 09/26/21  8:10 AM  ?Result Value Ref Range Status  ? Enterococcus faecalis NOT DETECTED NOT DETECTED Final  ? Enterococcus Faecium NOT DETECTED NOT DETECTED Final  ? Listeria monocytogenes NOT DETECTED NOT DETECTED Final  ? Staphylococcus species NOT DETECTED NOT DETECTED Final  ? Staphylococcus aureus (BCID) NOT DETECTED NOT DETECTED Final  ? Staphylococcus epidermidis NOT DETECTED NOT DETECTED Final  ? Staphylococcus lugdunensis NOT DETECTED NOT DETECTED Final  ? Streptococcus species DETECTED (A) NOT DETECTED Final  ?  Comment: CRITICAL RESULT CALLED TO, READ BACK BY AND VERIFIED WITH: ?ALEC CHAPPELL 2001 09/26/21 MU ?  ? Streptococcus agalactiae DETECTED (A) NOT DETECTED Final  ?  Comment: CRITICAL RESULT CALLED TO, READ BACK BY AND VERIFIED WITH: ?ALEX CHAPPELL 2001 09/26/21 MU ?  ? Streptococcus pneumoniae NOT DETECTED NOT DETECTED Final  ? Streptococcus pyogenes NOT DETECTED NOT DETECTED Final  ? A.calcoaceticus-baumannii NOT DETECTED NOT DETECTED Final  ? Bacteroides fragilis NOT DETECTED NOT DETECTED Final  ? Enterobacterales NOT DETECTED NOT DETECTED Final  ? Enterobacter cloacae complex NOT DETECTED NOT DETECTED Final  ? Escherichia coli NOT DETECTED NOT DETECTED Final  ? Klebsiella aerogenes NOT DETECTED NOT DETECTED Final  ? Klebsiella oxytoca NOT DETECTED NOT DETECTED Final  ? Klebsiella pneumoniae NOT DETECTED NOT DETECTED Final  ? Proteus species NOT DETECTED NOT DETECTED Final  ? Salmonella species NOT DETECTED NOT DETECTED Final  ? Serratia marcescens NOT DETECTED NOT DETECTED Final  ? Haemophilus influenzae NOT DETECTED NOT DETECTED Final  ? Neisseria meningitidis NOT DETECTED NOT DETECTED Final  ? Pseudomonas aeruginosa NOT DETECTED NOT  DETECTED Final  ? Stenotrophomonas maltophilia NOT DETECTED NOT DETECTED Final  ? Candida albicans NOT DETECTED NOT DETECTED Final  ? Candida auris NOT DETECTED NOT DETECTED Final  ? Candida glabrata NOT DETECTED NOT DETECTED Final  ? Candida krusei NOT DETECTED NOT DETECTED Final  ? Candida parapsilosis NOT DETECTED NOT DETECTED Final  ? Candida tropicalis NOT DETECTED NOT DETECTED Final  ? Cryptococcus neoformans/gattii NOT DETECTED NOT DETECTED Final  ?  Comment: Performed at [facility], [address] [place], [place] [address]  ?Blood Culture (routine x 2)     Status: None (Preliminary result)  ? Collection  Time: 09/26/21  8:11 AM  ? Specimen: BLOOD  ?Result Value Ref Range Status  ? Specimen Description BLOOD BLOOD LEFT WRIST  Final  ? Special Requests   Final  ?  BOTTLES DRAWN AEROBIC AND ANAEROBIC Blood Culture adequate volume  ? Culture   Final  ?  NO GROWTH 4 DAYS ?Performed at [facility], [address], [place], [place] [address] ?  ? Report Status PENDING  Incomplete  ?Resp Panel by RT-PCR (Flu A&B, Covid) Nasopharyngeal Swab     Status: None  ? Collection Time: 09/26/21  8:11 AM  ? Specimen: Nasopharyngeal Swab; Nasopharyngeal(NP) swabs in vial transport medium  ?Result Value Ref Range Status  ? SARS Coronavirus 2 by RT PCR NEGATIVE NEGATIVE Final  ?  Comment: (NOTE) ?SARS-CoV-2 target nucleic acids are NOT DETECTED. ? ?The SARS-CoV-2 RNA is generally detectable in upper respiratory ?specimens during the acute phase of infection. The lowest ?concentration of SARS-CoV-2 viral copies this assay can detect is ?138 copies/mL. A negative result does not preclude SARS-Cov-2 ?infection and should not be used as the sole basis for treatment or ?other patient management decisions. A negative result may occur with  ?improper specimen collection/handling, submission of specimen other ?than nasopharyngeal swab, presence of viral mutation(s) within the ?areas targeted by this assay, and  inadequate number of viral ?copies(<138 copies/mL). A negative result must be combined with ?clinical observations, patient history, and epidemiological ?information. The expected result is Negative. ? ?Fact Sheet for P

## 2021-09-30 NOTE — Discharge Summary (Signed)
[facility] Discharge Summary ? ? ?Patient: Catherine [name] YHC:[id number]  PCP: [facility]  ?Date of admission: 09/26/2021   Date of discharge:  09/30/2021   ?  ?Discharge Diagnoses:  ?Principal Problem: ?  Severe sepsis with septic shock (CODE) ([place]) ?Active Problems: ?  HCAP (healthcare-associated pneumonia) ?  AKI (acute kidney injury) ([place]) ?  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  Abdominal pain ?  HTN (hypertension) ?  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  Hypokalemia ?  Hypomagnesemia ?  HLD (hyperlipidemia) ? ? ?Admitted From: Home ?Disposition:  Home  ? ?Recommendations for Outpatient Follow-up:  ?PCP: in 1 week, continue diabetic diet, monitor fingerstick blood glucose at home and titrate insulin level accordingly.  Patient has been started on prednisone, high risk for hyperglycemia.  Monitor blood pressure at home and follow with PCP ?Follow-up with nephrology in 1 to 2 weeks ?Follow up LABS/TEST:   ? ? ?Diet recommendation: Cardiac and Carb modified diet ? ?Activity: The patient is advised to gradually reintroduce usual activities, as tolerated ? ?Discharge Condition: stable ? ?Code Status: Full code  ? ?History of present illness: As per the H and P dictated on admission ?Hospital Course:  ?Catherine [name] is a 21 y.o. female with medical history significant of type 1 diabetes, hypertension, hyperlipidemia, nephrotic syndrome on tacrolimus, who presents with abdominal pain. ?Patient states that her symptoms started in the early morning at about 1 AM.  Patient has nausea, vomiting and abdominal pain.  She has vomited more than 10 times with nonbilious nonbloody vomiting.  No diarrhea.  Her abdominal pain is diffuse, severe, sharp, nonradiating.  Patient denies subjective fever, but she has temperature 102.2 in ED.  No chills. Patient reports shortness of breath, no cough, chest pain.  She had 1  episode of oxygen desaturating to 87% on room air, which improved to 98-100% on room air later on.  Denies symptoms of UTI.  Patient is lethargic, but easily arousable, oriented x3.  Moves all extremities normally. ?Patient was initially hypotensive with blood pressure 86/57, which improved to 103/62 after giving 2 L LR bolus, but dropped to 77/50, then back to 94/51 after given 3L of LR. Levophed is ordered if MAP < 65 again, but it was never required and blood pressure remained within goal after that.Marland Kitchen ?CT abdomen was negative for any acute intra-abdominal findings.  General surgery was also consulted and they signed off as there was no concern of acute abdomen. ?Chest x-ray concerning for patchy bilateral pneumonia.  She was having cough for 3 days. ?MRSA PCR negative. ?  ?Patient was initially started on broad-spectrum antibiotics due to concern of immunocompromised with tacrolimus, she received Zosyn and vancomycin.  Later blood culture came back positive for Streptococcus agalactia, vancomycin was discontinued. ?Echocardiogram ordered-pending ?ID, nephrology and PCCM was also consulted. ?PCCM also signed off as patient is now stable and not requiring any pressors. ?  ?Patient developed diarrhea, GI pathogen panel negative. ?Respiratory viral panel negative. ?Zithromax was also added for CAP coverage. ?  ?Echocardiogram was normal, no vegetations.  No need for TEE per ID. ?  ?Clinically seems improving.  Zosyn was switched with Unasyn by ID. ?  ?Renal duplex scan done by nephrology was negative. ?  ?3/30: Clinically stable, leukocytosis improving.  Home dose of tacrolimus was held at ID request due to significant leukocytosis.  ID would like to normalize leukocytosis before  repeating blood cultures.  TTE was normal, no need for TEE per ID. ?Home Lasix was switched with torsemide by nephrology. ?  ?Above hospital course taken from the previous notes.  I started taking care of this patient on 09/30/2021 ?   ?Assessment and Plan: ?* Severe sepsis with septic shock (CODE) ([place]) ?Patient met severe sepsis with septic shock criteria with fever, tachycardia, tachypnea, lactic acidosis and AKI.  She was hypotensive and required 3 IV boluses, levo was ordered but never required. PCCM was also consulted, they signed off as she is currently stable. ?Blood cultures growing strep agalactiae, concern of GI or GU source. Urine culture grew group B strep agalactiae. Chest x-ray concerning for pneumonia-on repeat there is some worsening. MRSA PCR negative.  Flu negative, COVID-negative, RVP negative,Procalcitonin started improving 65.95>> 38.51. Echocardiogram was normal, no need TEE per ID. ?GI pathogen panel negative. ID consult is on board. S/p azithromycin and Zosyn, switched to Unasyn, transition to Augmentin twice daily for 5 days as per ID, and cleared for discharge.  WBC count gradually trended down ?HCAP (healthcare-associated pneumonia) ?Chest x-ray concerning for right middle lower lobe pneumonia on admission, repeat x-ray done this morning with bilateral worsening opacities concerning for pneumonia.  MRSA PCR negative.  Strep pneumo antigen negative, Legionella negative ?S/p azithromycin and Zosyn, s/p Unasyn transition to oral Augmentin twice daily for 5 additional days as per ID. ?AKI (acute kidney injury) Most likely with severe sepsis.  Baseline creatinine appears to be less than 1.  Also has an history of nephrotic syndrome.  Creatinine currently stable, at 1.3. ?Nephrology was also consulted.  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 She was started on prednisone 40 mg p.o. daily by nephrology. Home Lasix was switched with torsemide 20 mg. Patient needs to follow-up with pediatric nephrology at [facility]. ?Abdominal pain, Resolved.  Unclear etiology. No more nausea or  vomiting today. ?Did develop some diarrhea-GI pathogen panel sent. General surgery was also consulted but there is no concern of acute abdomen as CT abdomen was without any acute findings. ?HTN (hypertension) ?Patient presented initially with hypotension secondary to septic shock which has been resolved now.  Blood pressure now within goal.  We initially held home Lasix. ,Nephrology switched her home Lasix with torsemide.  BP remained stable. 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 CBG remained elevated, despite increasing Semglee. Patient was on Lantus 30 units daily along with NovoLog. S/p Semglee at 15 units twice daily and Increase mealtime coverage to 5 units and start her on moderate scale.  Resumed home dose insulin on discharge, patient was advised to monitor FSBG closely and titrate dose of insulin accordingly and follow with PCP and endocrinologist as an outpatient. ?Hypokalemia, Resolved after repletion. ?Hypomagnesemia, Resolved with repletion. ?HLD (hyperlipidemia),  Continue with Lipitor ?Body mass index is 26.72 kg/m?Marland Kitchen  ?Nutrition Interventions: ?   ?Patient was ambulatory without any assistance. ?On the day of the discharge the patient's vitals were stable, and no other acute medical condition were reported by patient. the patient was felt safe to be discharge at Home. ? ?Consultants: Nephrology and ID ?Procedures: None ? ?Discharge Exam: ?General: Appear in no distress, no Rash; Oral Mucosa Clear, moist. ?Cardiovascular: S1 and S2 Present, no Murmur, ?Respiratory: normal respiratory effort, Bilateral Air entry present and no Crackles, no wheezes ?Abdomen: Bowel  Sound present, Soft and no tenderness, no hernia ?Extremities: no Pedal edema, no calf tenderness ?Neurology: alert and oriented to time, place, and person ?affect appropriate. ? Danley Danker Weights  ? 09/26/21 0409  ?Weight: 60 kg  ? ?Vitals:  ? 09/30/21 0525 09/30/21 0746   ?BP: 113/80 118/87  ?Pulse: 73 77  ?Resp: 18 16  ?Temp: 98.5 ?F (36.9 ?C) (!) 97.5 ?F (36.4 ?C)  ?SpO2: 100% 99%  ? ? ?DISCHARGE MEDICATION: ?Allergies as of 09/30/2021   ?No Known Allergies ?  ? ?  ?[facility]

## 2021-09-30 NOTE — TOC Progression Note (Signed)
Transition of Care (TOC) - Progression Note  ? ? ?Patient Details  ?Name: Catherine [name] ?MRN: [id number] ?Date of Birth: 06/09/2001 ? ?Transition of Care (TOC) CM/SW Contact  ?Areg Bialas E Kessie Croston, LCSW ?Phone Number: ?09/30/2021, 10:30 AM ? ?Clinical Narrative:   Call from [facility] with patient's Medicaid plan ([place] [employer]) asking for an update. Provided update based on notes. Kayla asked that [facility] call her secure phone and leave a VM to notify her when patient is DC at [phone]. ? ? ? ?Expected Discharge Plan: Home/Self Care ?Barriers to Discharge: Continued Medical Work up ? ?Expected Discharge Plan and Services ?Expected Discharge Plan: Home/Self Care ?  ?  ?  ?  ?                ?  ?  ?  ?  ?  ?  ?  ?  ?  ?  ? ? ?Social Determinants of Health (SDOH) Interventions ?  ? ?Readmission Risk Interventions ? ?  09/27/2021  ?  4:09 PM  ?Readmission Risk Prevention Plan  ?Transportation Screening Complete  ?PCP or Specialist Appt within 3-5 Days Complete  ?[place] or Home Care Consult Complete  ?Social Work Consult for [place] Planning/Counseling Complete  ?Palliative Care Screening Not Applicable  ?Medication Review Press photographer) Referral to Pharmacy  ? ? ?

## 2021-09-30 NOTE — Progress Notes (Signed)
? ?  Date of Admission:  09/26/2021    ? ?ID: Catherine [name] is a 21 y.o. female  ?Principal Problem: ?  Severe sepsis with septic shock (CODE) (HCC) ?Active Problems: ?  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  AKI (acute kidney injury) (HCC) ?  HTN (hypertension) ?  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  Abdominal pain ?  Hypokalemia ?  HLD (hyperlipidemia) ?  HCAP (healthcare-associated pneumonia) ?  Hypomagnesemia ? ? ? ?Subjective: ?Pt feeling better ?Says abdominal distension better after starting diuretic ? ?Medications:  ? heparin  5,000 Units Subcutaneous Q8H  ? insulin aspart  0-15 Units Subcutaneous TID WC  ? insulin aspart  0-5 Units Subcutaneous QHS  ? insulin aspart  5 Units Subcutaneous TID WC  ? insulin glargine-yfgn  15 Units Subcutaneous BID  ? polyethylene glycol  17 g Oral Daily  ? predniSONE  40 mg Oral Q breakfast  ? torsemide  20 mg Oral Daily  ? ? ?Objective: ?Vital signs in last 24 hours: ?Patient Vitals for the past 24 hrs: ? BP Temp Temp src Pulse Resp SpO2  ?09/30/21 0746 118/87 (!) 97.5 ?F (36.4 ?C) Oral 77 16 99 %  ?09/30/21 0525 113/80 98.5 ?F (36.9 ?C) -- 73 18 100 %  ?09/29/21 1931 (!) 126/91 97.9 ?F (36.6 ?C) -- 92 (!) 22 100 %  ?09/29/21 1607 114/83 97.8 ?F (36.6 ?C) -- 90 16 100 %  ?  ?PHYSICAL EXAM:  ?General: Alert, cooperative, no distress, appears stated age.  ?Head: Normocephalic, without obvious abnormality, atraumatic. ?Eyes: Conjunctivae clear, anicteric sclerae. Pupils are equal ?ENT Nares normal. No drainage or sinus tenderness. ?Lips, mucosa, and tongue normal. No Thrush ?Neck: Supple, symmetrical, no adenopathy, thyroid: non tender ?no carotid bruit and no JVD. ?Back: No CVA tenderness. ?Lungs: b/l air entry- few crepts ?Heart: Regular rate and rhythm, no murmur, rub or gallop. ?Abdomen: Soft, distended ?Extremities: edema ankles ?Skin: No rashes or lesions. Or bruising ?Lymph: Cervical,  supraclavicular normal. ?Neurologic: Grossly non-focal ? ?Lab Results ?Recent Labs  ?  09/28/21 ?5449 09/29/21 ?0901  ?WBC 22.7* 19.7*  ?HGB 10.3* 12.6  ?HCT 31.1* 38.4  ?NA 133*  --   ?K 3.9  --   ?CL 108  --   ?CO2 20*  --   ?BUN 11  --   ?CREATININE 1.34*  --   ? ?Liver Panel ?Recent Labs  ?  09/28/21 ?2010  ?PROT 4.2*  ?ALBUMIN <1.5*  ?AST 25  ?ALT 26  ?ALKPHOS 140*  ?BILITOT 0.5  ?BILIDIR <0.1  ?IBILI NOT CALCULATED  ? ?Microbiology: ?1 of 4 GBS ?Studies/Results: ?No results found. ? ? ?Assessment/Plan: ? ?Septic shock with GBS bacteremia- no skin lesions ?? UTI as UC also has 4K colonies of GBS ?More fluid overload than pneumonia  ?On unasyn 2d echo valves are fine .No need for TEE as risk for endocarditis is negligible ?May do 10 days of antibiotic ? ?Leucocytosis- resolved ?Pt can be switched to Po augmentin for 5 more days ? ?IDDM- poorly controlled ? ?Abdominal pain with GI symptoms resolved- no clear etiology ? ?Nephrotic syndrome ?Hypoalbuminemia ?On tacrolimus which is on hold because of infection- started on prednisone ? ? ?Discussed the management with patient and care team ?  ?

## 2021-09-30 NOTE — Progress Notes (Signed)
[facility] Progress Note ? ?Patient: Catherine [name]    WGN:[id number]  DOA: 09/26/2021    ? ?Date of Service: the patient was seen and examined on 09/30/2021 ? ?Chief Complaint  ?Patient presents with  ? Abdominal Pain  ? ?Brief hospital course: ?Catherine [name] is a 21 y.o. female with medical history significant of type 1 diabetes, hypertension, hyperlipidemia, nephrotic syndrome on tacrolimus, who presents with abdominal pain. ?  ?Patient states that her symptoms started in the early morning at about 1 AM.  Patient has nausea, vomiting and abdominal pain.  She has vomited more than 10 times with nonbilious nonbloody vomiting.  No diarrhea.  Her abdominal pain is diffuse, severe, sharp, nonradiating.  Patient denies subjective fever, but she has temperature 102.2 in ED.  No chills. Patient reports shortness of breath, no cough, chest pain.  She had 1 episode of oxygen desaturating to 87% on room air, which improved to 98-100% on room air later on.  Denies symptoms of UTI.  Patient is lethargic, but easily arousable, oriented x3.  Moves all extremities normally. ?  ?Patient was initially hypotensive with blood pressure 86/57, which improved to 103/62 after giving 2 L LR bolus, but dropped to 77/50, then back to 94/51 after given 3L of LR. Levophed is ordered if MAP < 65 again, but it was never required and blood pressure remained within goal after that.. ?  ?CT abdomen was negative for any acute intra-abdominal findings.  General surgery was also consulted and they signed off as there was no concern of acute abdomen. ?Chest x-ray concerning for patchy bilateral pneumonia.  She was having cough for 3 days. ?MRSA PCR negative. ?  ?Patient was initially started on broad-spectrum antibiotics due to concern of immunocompromised with tacrolimus, she received Zosyn and vancomycin.  Later blood culture came back positive for Streptococcus agalactia, vancomycin was discontinued. ?Echocardiogram  ordered-pending ?ID, nephrology and PCCM was also consulted. ?PCCM also signed off as patient is now stable and not requiring any pressors. ?  ?Patient developed diarrhea, GI pathogen panel negative. ?Respiratory viral panel negative. ?Zithromax was also added for CAP coverage. ?  ?Echocardiogram was normal, no vegetations.  No need for TEE per ID. ?  ?Clinically seems improving.  Zosyn was switched with Unasyn by ID. ?  ?Renal duplex scan done by nephrology was negative. ?  ?3/30: Clinically stable, leukocytosis improving.  Home dose of tacrolimus was held at ID request due to significant leukocytosis.  ID would like to normalize leukocytosis before repeating blood cultures.  TTE was normal, no need for TEE per ID. ?Home Lasix was switched with torsemide by nephrology. ? ?Above hospital course taken from the previous notes.  I started taking care of this patient on 09/30/2021 ? ? ?Assessment and Plan: ?* Severe sepsis with septic shock (CODE) ([place]) ?Patient met severe sepsis with septic shock criteria with fever, tachycardia, tachypnea, lactic acidosis and AKI.  She was hypotensive and required 3 IV boluses, levo was ordered but never required. ?PCCM was also consulted, they signed off as she is currently stable. ?Blood cultures growing strep agalactiae, concern of GI or GU source. ?Urine and respiratory cultures pending. ?Chest x-ray concerning for pneumonia-on repeat there is some worsening. ?MRSA PCR negative. ?Procalcitonin started improving 65.95>> 38.51 ?Echocardiogram was normal, no need TEE per ID. ?GI pathogen panel negative ?ID consult is on board. S/p azithromycin and Zosyn, switched to Unasyn ?-Continue with Unasyn ?-Current continue with supportive care. ?ID recommended 10 days of antibiotics,  we will follow ID to see if we can transition to oral antibiotics on dc ?? ?Trend WBC count ? ?HCAP (healthcare-associated pneumonia) ?Chest x-ray concerning for right middle lower lobe pneumonia on admission,  repeat x-ray done this morning with bilateral worsening opacities concerning for pneumonia.  MRSA PCR negative.  Strep pneumo antigen negative, Legionella negative ?S/p azithromycin and Zosyn ?-Continue with Unasyn ?-Continue with supportive care ?  ?AKI (acute kidney injury) ([place]) ?Most likely with severe sepsis.  Baseline creatinine appears to be less than 1.  Also has an history of nephrotic syndrome.  Creatinine currently stable, at 1.3. ?Nephrology was also consulted. ?-Monitor renal function ?-Avoid nephrotoxins ?  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 ?-Nephrology advised to hold tacrolimus until infection is clear. ?-She was started on prednisone 40 mg p.o. daily by nephrology ?-Home Lasix was switched with torsemide 20 mg ?-Per nephrology she might get benefit from a repeat kidney biopsy to see if it is minimal-change or diabetic nephropathy as management will be different. ?-Patient follow-up with pediatric nephrology at [facility]. ?  ?Abdominal pain ?Resolved.  Unclear etiology. ?No more nausea or vomiting today. ?Did develop some diarrhea-GI pathogen panel sent. ?General surgery was also consulted but there is no concern of acute abdomen as CT abdomen was without any acute findings. ?-Continue to monitor ?-Supportive care ?  ?HTN (hypertension) ?Patient presented initially with hypotension secondary to septic shock which has been resolved now.  Blood pressure now within goal.  We initially held home Lasix. ?-Nephrology switched her home Lasix with torsemide ?-Continue to monitor ?  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 ?CBG remained elevated, despite increasing Semglee yesterday ?Patient was on Lantus 30 units daily along with NovoLog. ?-Continue Semglee at 15 units twice daily. ?-Increase mealtime coverage to 5 units ?-Discontinue sensitive  sliding scale and start her on moderate scale ?  ?Hypokalemia ?Resolved after repletion. ?-Monitor potassium and replete as needed ?  ?Hypomagnesemia ?Resolved with repletion. ?-Continue to monitor and replete as needed ?  ?HLD (hyperlipidemia) ?- Continue with Lipitor ? ? ?Body mass index is 26.72 kg/m?.  ?Interventions: ?  ? ?   ?Diet: Carb modified diet ?DVT Prophylaxis: Subcutaneous Heparin   ? ?Advance goals of care discussion: Full code ? ?Family Communication: family was NOT present at bedside, at the time of interview.  ?The pt provided permission to discuss medical plan with the family. Opportunity was given to ask question and all questions were answered satisfactorily.  ? ?Disposition:  ?Pt is from Home, admitted with sepsis, renal failure, still has elevated wbc count and ojn IV abx, which precludes a safe discharge. ?Discharge to Home, when WBC count improves and patient is cleared by ID on oral antibiotics.. ? ?Subjective: No significant overnight events, patient was sleepy, she woke up by calling her name, denied any active issues, no chest pain or pressure, no nausea vomiting or diarrhea.  No abdominal pain ? ?Physical Exam: ?General:  alert oriented to time, place, and person.  ?Appear in no distress, affect appropriate ?Eyes: PERRLA ?ENT: Oral Mucosa Clear, moist  ?Neck: no JVD,  ?Cardiovascular: S1 and S2 Present, no Murmur,  ?Respiratory: good respiratory effort, Bilateral Air entry equal and Decreased, no Crackles, no wheezes ?Abdomen: Bowel Sound present, Soft and no tenderness,  ?Skin: no rashes ?Extremities: no Pedal edema, no calf tenderness ?Neurologic: without any new focal findings ?  Gait not checked due to patient safety concerns ? ?Vitals:  ? 09/29/21 1607 09/29/21 1931 09/30/21 0525 09/30/21 0746  ?BP: 114/83 (!) 126/91 113/80 118/87  ?Pulse: 90 92 73 77  ?Resp: 16 (!) _0 ?Temp: 97.8 ?F (36.6 ?C) 97.9 ?F (36.6 ?C) 98.5 ?F (36.9 ?C) (!) 97.5 ?F (36.4 ?C)  ?TempSrc:    Oral  ?SpO2:  100% 100% 100% 99%  ?Weight:      ?Height:      ? ? ?Intake/Output Summary (Last 24 hours) at 09/30/2021 1247 ?Last data filed at 09/29/2021 1412 ?Gross per 24 hour  ?Intake 240 ml  ?Output --  ?Net 240 ml  ? ?Danley Danker Oliver Barre

## 2021-10-01 LAB — CULTURE, BLOOD (ROUTINE X 2)
Culture: NO GROWTH
Special Requests: ADEQUATE

## 2022-03-29 ENCOUNTER — Other Ambulatory Visit: Payer: Self-pay

## 2022-03-29 ENCOUNTER — Encounter: Payer: Self-pay | Admitting: Intensive Care

## 2022-03-29 ENCOUNTER — Emergency Department
Admission: EM | Admit: 2022-03-29 | Discharge: 2022-03-29 | Disposition: A | Discharge status: Home / Self Care | Payer: Medicaid Other | Attending: Student in an Organized Health Care Education/Training Program | Admitting: Student in an Organized Health Care Education/Training Program

## 2022-03-29 ENCOUNTER — Emergency Department: Payer: Medicaid Other

## 2022-03-29 DIAGNOSIS — M25511 Pain in right shoulder: Secondary | ICD-10-CM | POA: Insufficient documentation

## 2022-03-29 DIAGNOSIS — S4991XA Unspecified injury of right shoulder and upper arm, initial encounter: Secondary | ICD-10-CM | POA: Diagnosis present

## 2022-03-29 DIAGNOSIS — S42021A Displaced fracture of shaft of right clavicle, initial encounter for closed fracture: Secondary | ICD-10-CM | POA: Diagnosis not present

## 2022-03-29 MED ORDER — OXYCODONE-ACETAMINOPHEN 5-325 MG PO TABS: 1.0000 | ORAL_TABLET | ORAL | 0 refills | Status: DC | PRN

## 2022-03-29 MED ORDER — OXYCODONE HCL 5 MG PO TABS
5.0000 mg | ORAL_TABLET | Freq: Once | ORAL | Status: AC
  Administered 2022-03-29: 5 mg via ORAL
  Filled 2022-03-29: qty 1
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
## 2022-03-29 NOTE — ED Triage Notes (Signed)
Patient presents with right shoulder injury. Appears dislocated

## 2022-06-26 ENCOUNTER — Other Ambulatory Visit: Payer: Self-pay

## 2022-06-26 DIAGNOSIS — R195 Other fecal abnormalities: Secondary | ICD-10-CM | POA: Insufficient documentation

## 2022-06-26 DIAGNOSIS — R109 Unspecified abdominal pain: Secondary | ICD-10-CM | POA: Insufficient documentation

## 2022-06-26 DIAGNOSIS — M549 Dorsalgia, unspecified: Secondary | ICD-10-CM | POA: Insufficient documentation

## 2022-06-26 DIAGNOSIS — R1110 Vomiting, unspecified: Secondary | ICD-10-CM | POA: Insufficient documentation

## 2022-06-26 DIAGNOSIS — Z5321 Procedure and treatment not carried out due to patient leaving prior to being seen by health care provider: Secondary | ICD-10-CM | POA: Diagnosis not present

## 2022-06-26 LAB — COMPREHENSIVE METABOLIC PANEL
ALT: 14 U/L (ref 0–44)
AST: 13 U/L — ABNORMAL LOW (ref 15–41)
Albumin: 1.6 g/dL — ABNORMAL LOW (ref 3.5–5.0)
Alkaline Phosphatase: 140 U/L — ABNORMAL HIGH (ref 38–126)
Anion gap: 9 (ref 5–15)
BUN: 34 mg/dL — ABNORMAL HIGH (ref 6–20)
CO2: 19 mmol/L — ABNORMAL LOW (ref 22–32)
Calcium: 8.1 mg/dL — ABNORMAL LOW (ref 8.9–10.3)
Chloride: 105 mmol/L (ref 98–111)
Creatinine, Ser: 3.78 mg/dL — ABNORMAL HIGH (ref 0.44–1.00)
GFR, Estimated: 17 mL/min — ABNORMAL LOW (ref >60)
Glucose, Bld: 518 mg/dL (ref 70–99)
Potassium: 4.6 mmol/L (ref 3.5–5.1)
Sodium: 133 mmol/L — ABNORMAL LOW (ref 135–145)
Total Bilirubin: 0.6 mg/dL (ref 0.3–1.2)
Total Protein: 6.4 g/dL — ABNORMAL LOW (ref 6.5–8.1)

## 2022-06-26 LAB — CBC
HCT: 41.2 % (ref 36.0–46.0)
Hemoglobin: 13.5 g/dL (ref 12.0–15.0)
MCH: 28.4 pg (ref 26.0–34.0)
MCHC: 32.8 g/dL (ref 30.0–36.0)
MCV: 86.6 fL (ref 80.0–100.0)
Platelets: 530 10*3/uL — ABNORMAL HIGH (ref 150–400)
RBC: 4.76 MIL/uL (ref 3.87–5.11)
RDW: 13.1 % (ref 11.5–15.5)
WBC: 9.8 10*3/uL (ref 4.0–10.5)
nRBC: 0 % (ref 0.0–0.2)

## 2022-06-26 LAB — URINALYSIS, ROUTINE W REFLEX MICROSCOPIC
Bilirubin Urine: NEGATIVE
Glucose, UA: >=500 mg/dL — AB
Hgb urine dipstick: NEGATIVE
Ketones, ur: 5 mg/dL — AB
Leukocytes,Ua: NEGATIVE
Nitrite: NEGATIVE
Protein, ur: >=300 mg/dL — AB
Specific Gravity, Urine: 1.006 (ref 1.005–1.030)
pH: 6 (ref 5.0–8.0)

## 2022-06-26 LAB — POC URINE PREG, ED: Preg Test, Ur: NEGATIVE

## 2022-06-26 MED ORDER — SODIUM CHLORIDE 0.9 % IV BOLUS (SEPSIS): 1000.0000 mL | Freq: Once | INTRAVENOUS | Status: DC

## 2022-06-26 NOTE — ED Triage Notes (Signed)
Pt states she believes she is having a relapse of her nephrotic syndrome. Pt states she has been having vomiting, abd pain, back pain and inability to have a bowel movement.

## 2022-06-26 NOTE — ED Notes (Signed)
Critical glucose of 518 called from lab, reported to jonathan cuthriell, pa, no new orders received.

## 2022-06-27 ENCOUNTER — Emergency Department
Admission: EM | Admit: 2022-06-27 | Discharge: 2022-06-27 | Discharge status: Against Medical Advice | Payer: Medicaid Other | Attending: Emergency Medicine | Admitting: Emergency Medicine

## 2022-06-27 NOTE — ED Notes (Signed)
Called pt 3 times for iv initiation, rooming and blood gas, no answer.

## 2022-06-28 ENCOUNTER — Other Ambulatory Visit: Payer: Self-pay

## 2022-06-28 ENCOUNTER — Emergency Department
Admission: EM | Admit: 2022-06-28 | Discharge: 2022-06-28 | Discharge status: Against Medical Advice | Payer: Medicaid Other | Attending: Emergency Medicine | Admitting: Emergency Medicine

## 2022-06-28 DIAGNOSIS — R1030 Lower abdominal pain, unspecified: Secondary | ICD-10-CM | POA: Insufficient documentation

## 2022-06-28 DIAGNOSIS — Z5321 Procedure and treatment not carried out due to patient leaving prior to being seen by health care provider: Secondary | ICD-10-CM | POA: Diagnosis not present

## 2022-06-28 DIAGNOSIS — R110 Nausea: Secondary | ICD-10-CM | POA: Diagnosis not present

## 2022-06-28 LAB — CBC
HCT: 43.7 % (ref 36.0–46.0)
Hemoglobin: 14.5 g/dL (ref 12.0–15.0)
MCH: 27.9 pg (ref 26.0–34.0)
MCHC: 33.2 g/dL (ref 30.0–36.0)
MCV: 84 fL (ref 80.0–100.0)
Platelets: 556 10*3/uL — ABNORMAL HIGH (ref 150–400)
RBC: 5.2 MIL/uL — ABNORMAL HIGH (ref 3.87–5.11)
RDW: 12.7 % (ref 11.5–15.5)
WBC: 9.1 10*3/uL (ref 4.0–10.5)
nRBC: 0 % (ref 0.0–0.2)

## 2022-06-28 LAB — COMPREHENSIVE METABOLIC PANEL
ALT: 11 U/L (ref 0–44)
AST: 12 U/L — ABNORMAL LOW (ref 15–41)
Albumin: 1.6 g/dL — ABNORMAL LOW (ref 3.5–5.0)
Alkaline Phosphatase: 149 U/L — ABNORMAL HIGH (ref 38–126)
Anion gap: 7 (ref 5–15)
BUN: 37 mg/dL — ABNORMAL HIGH (ref 6–20)
CO2: 24 mmol/L (ref 22–32)
Calcium: 8.7 mg/dL — ABNORMAL LOW (ref 8.9–10.3)
Chloride: 107 mmol/L (ref 98–111)
Creatinine, Ser: 3.67 mg/dL — ABNORMAL HIGH (ref 0.44–1.00)
GFR, Estimated: 17 mL/min — ABNORMAL LOW (ref >60)
Glucose, Bld: 233 mg/dL — ABNORMAL HIGH (ref 70–99)
Potassium: 4.1 mmol/L (ref 3.5–5.1)
Sodium: 138 mmol/L (ref 135–145)
Total Bilirubin: 0.5 mg/dL (ref 0.3–1.2)
Total Protein: 6.8 g/dL (ref 6.5–8.1)

## 2022-06-28 LAB — LIPASE, BLOOD: Lipase: 29 U/L (ref 11–51)

## 2022-06-28 NOTE — ED Triage Notes (Signed)
Pt here with lower abd pain that started on Sunday. Pt endorses nausea but denies V/D.

## 2022-08-22 ENCOUNTER — Encounter
Admission: EM | Disposition: A | Discharge status: Home / Self Care | Payer: Self-pay | Source: Home / Self Care | Attending: Hospitalist

## 2022-08-22 ENCOUNTER — Inpatient Hospital Stay (HOSPITAL_COMMUNITY)
Admit: 2022-08-22 | Discharge: 2022-08-22 | Disposition: A | Discharge status: Home / Self Care | Payer: Medicaid Other | Attending: Internal Medicine | Admitting: Internal Medicine

## 2022-08-22 ENCOUNTER — Other Ambulatory Visit: Payer: Self-pay

## 2022-08-22 ENCOUNTER — Inpatient Hospital Stay
Admit: 2022-08-22 | Discharge: 2022-08-22 | Disposition: A | Discharge status: Home / Self Care | Payer: Medicaid Other | Attending: Internal Medicine | Admitting: Internal Medicine

## 2022-08-22 ENCOUNTER — Encounter: Payer: Self-pay | Admitting: Emergency Medicine

## 2022-08-22 ENCOUNTER — Inpatient Hospital Stay: Payer: Medicaid Other

## 2022-08-22 ENCOUNTER — Emergency Department: Payer: Medicaid Other

## 2022-08-22 ENCOUNTER — Inpatient Hospital Stay
Admission: EM | Admit: 2022-08-22 | Discharge: 2022-08-25 | DRG: 164 | Disposition: A | Discharge status: Home / Self Care | Payer: Medicaid Other | Attending: Hospitalist | Admitting: Hospitalist

## 2022-08-22 DIAGNOSIS — I2609 Other pulmonary embolism with acute cor pulmonale: Secondary | ICD-10-CM

## 2022-08-22 DIAGNOSIS — Z86711 Personal history of pulmonary embolism: Secondary | ICD-10-CM

## 2022-08-22 DIAGNOSIS — I129 Hypertensive chronic kidney disease with stage 1 through stage 4 chronic kidney disease, or unspecified chronic kidney disease: Secondary | ICD-10-CM | POA: Diagnosis present

## 2022-08-22 DIAGNOSIS — I2602 Saddle embolus of pulmonary artery with acute cor pulmonale: Secondary | ICD-10-CM | POA: Diagnosis not present

## 2022-08-22 DIAGNOSIS — E8809 Other disorders of plasma-protein metabolism, not elsewhere classified: Secondary | ICD-10-CM | POA: Diagnosis present

## 2022-08-22 DIAGNOSIS — Z1152 Encounter for screening for COVID-19: Secondary | ICD-10-CM | POA: Diagnosis not present

## 2022-08-22 DIAGNOSIS — I2699 Other pulmonary embolism without acute cor pulmonale: Secondary | ICD-10-CM

## 2022-08-22 DIAGNOSIS — N2581 Secondary hyperparathyroidism of renal origin: Secondary | ICD-10-CM | POA: Diagnosis present

## 2022-08-22 DIAGNOSIS — E1065 Type 1 diabetes mellitus with hyperglycemia: Secondary | ICD-10-CM | POA: Diagnosis present

## 2022-08-22 DIAGNOSIS — F1210 Cannabis abuse, uncomplicated: Secondary | ICD-10-CM | POA: Diagnosis present

## 2022-08-22 DIAGNOSIS — Z7901 Long term (current) use of anticoagulants: Secondary | ICD-10-CM

## 2022-08-22 DIAGNOSIS — D849 Immunodeficiency, unspecified: Secondary | ICD-10-CM | POA: Diagnosis present

## 2022-08-22 DIAGNOSIS — Z7984 Long term (current) use of oral hypoglycemic drugs: Secondary | ICD-10-CM

## 2022-08-22 DIAGNOSIS — R051 Acute cough: Secondary | ICD-10-CM | POA: Diagnosis present

## 2022-08-22 DIAGNOSIS — T45516A Underdosing of anticoagulants, initial encounter: Secondary | ICD-10-CM | POA: Diagnosis present

## 2022-08-22 DIAGNOSIS — N049 Nephrotic syndrome with unspecified morphologic changes: Secondary | ICD-10-CM | POA: Diagnosis present

## 2022-08-22 DIAGNOSIS — E1021 Type 1 diabetes mellitus with diabetic nephropathy: Secondary | ICD-10-CM | POA: Diagnosis present

## 2022-08-22 DIAGNOSIS — R0902 Hypoxemia: Secondary | ICD-10-CM | POA: Diagnosis present

## 2022-08-22 DIAGNOSIS — E1022 Type 1 diabetes mellitus with diabetic chronic kidney disease: Secondary | ICD-10-CM | POA: Diagnosis present

## 2022-08-22 DIAGNOSIS — E119 Type 2 diabetes mellitus without complications: Secondary | ICD-10-CM

## 2022-08-22 DIAGNOSIS — Z79899 Other long term (current) drug therapy: Secondary | ICD-10-CM | POA: Diagnosis not present

## 2022-08-22 DIAGNOSIS — J984 Other disorders of lung: Secondary | ICD-10-CM | POA: Diagnosis present

## 2022-08-22 DIAGNOSIS — N1831 Chronic kidney disease, stage 3a: Secondary | ICD-10-CM | POA: Diagnosis present

## 2022-08-22 DIAGNOSIS — M546 Pain in thoracic spine: Secondary | ICD-10-CM | POA: Diagnosis present

## 2022-08-22 DIAGNOSIS — F17200 Nicotine dependence, unspecified, uncomplicated: Secondary | ICD-10-CM | POA: Diagnosis present

## 2022-08-22 DIAGNOSIS — I959 Hypotension, unspecified: Secondary | ICD-10-CM | POA: Diagnosis present

## 2022-08-22 DIAGNOSIS — Z79621 Long term (current) use of calcineurin inhibitor: Secondary | ICD-10-CM

## 2022-08-22 DIAGNOSIS — T508X5A Adverse effect of diagnostic agents, initial encounter: Secondary | ICD-10-CM | POA: Diagnosis not present

## 2022-08-22 DIAGNOSIS — R0602 Shortness of breath: Secondary | ICD-10-CM | POA: Diagnosis present

## 2022-08-22 DIAGNOSIS — Z716 Tobacco abuse counseling: Secondary | ICD-10-CM

## 2022-08-22 DIAGNOSIS — E785 Hyperlipidemia, unspecified: Secondary | ICD-10-CM | POA: Diagnosis present

## 2022-08-22 DIAGNOSIS — Z91128 Patient's intentional underdosing of medication regimen for other reason: Secondary | ICD-10-CM

## 2022-08-22 DIAGNOSIS — R600 Localized edema: Secondary | ICD-10-CM | POA: Diagnosis not present

## 2022-08-22 DIAGNOSIS — Z86718 Personal history of other venous thrombosis and embolism: Secondary | ICD-10-CM

## 2022-08-22 DIAGNOSIS — N040 Nephrotic syndrome with minor glomerular abnormality: Secondary | ICD-10-CM | POA: Diagnosis not present

## 2022-08-22 DIAGNOSIS — F17290 Nicotine dependence, other tobacco product, uncomplicated: Secondary | ICD-10-CM | POA: Diagnosis present

## 2022-08-22 HISTORY — DX: Hyperlipidemia, unspecified: E78.5

## 2022-08-22 HISTORY — DX: Other disorders of plasma-protein metabolism, not elsewhere classified: E88.09

## 2022-08-22 HISTORY — DX: Nicotine dependence, unspecified, uncomplicated: F17.200

## 2022-08-22 HISTORY — DX: Other pulmonary embolism with acute cor pulmonale: I26.09

## 2022-08-22 HISTORY — PX: PULMONARY THROMBECTOMY: CATH118295

## 2022-08-22 HISTORY — DX: Cannabis abuse, uncomplicated: F12.10

## 2022-08-22 LAB — URINALYSIS, ROUTINE W REFLEX MICROSCOPIC
Bilirubin Urine: NEGATIVE
Glucose, UA: 150 mg/dL — AB
Ketones, ur: NEGATIVE mg/dL
Leukocytes,Ua: NEGATIVE
Nitrite: NEGATIVE
Protein, ur: >=300 mg/dL — AB
Specific Gravity, Urine: >1.046 — ABNORMAL HIGH (ref 1.005–1.030)
pH: 5 (ref 5.0–8.0)

## 2022-08-22 LAB — CBC WITH DIFFERENTIAL/PLATELET
Abs Immature Granulocytes: 0.07 10*3/uL (ref 0.00–0.07)
Basophils Absolute: 0.1 10*3/uL (ref 0.0–0.1)
Basophils Relative: 1 %
Eosinophils Absolute: 0.2 10*3/uL (ref 0.0–0.5)
Eosinophils Relative: 1 %
HCT: 47.4 % — ABNORMAL HIGH (ref 36.0–46.0)
Hemoglobin: 15.6 g/dL — ABNORMAL HIGH (ref 12.0–15.0)
Immature Granulocytes: 1 %
Lymphocytes Relative: 31 %
Lymphs Abs: 4.7 10*3/uL — ABNORMAL HIGH (ref 0.7–4.0)
MCH: 27.9 pg (ref 26.0–34.0)
MCHC: 32.9 g/dL (ref 30.0–36.0)
MCV: 84.6 fL (ref 80.0–100.0)
Monocytes Absolute: 1 10*3/uL (ref 0.1–1.0)
Monocytes Relative: 6 %
Neutro Abs: 9.2 10*3/uL — ABNORMAL HIGH (ref 1.7–7.7)
Neutrophils Relative %: 60 %
Platelets: 300 10*3/uL (ref 150–400)
RBC: 5.6 MIL/uL — ABNORMAL HIGH (ref 3.87–5.11)
RDW: 14.7 % (ref 11.5–15.5)
WBC: 15.2 10*3/uL — ABNORMAL HIGH (ref 4.0–10.5)
nRBC: 0 % (ref 0.0–0.2)

## 2022-08-22 LAB — COMPREHENSIVE METABOLIC PANEL
ALT: 17 U/L (ref 0–44)
AST: 19 U/L (ref 15–41)
Albumin: <1.5 g/dL — ABNORMAL LOW (ref 3.5–5.0)
Alkaline Phosphatase: 185 U/L — ABNORMAL HIGH (ref 38–126)
Anion gap: 7 (ref 5–15)
BUN: 32 mg/dL — ABNORMAL HIGH (ref 6–20)
CO2: 23 mmol/L (ref 22–32)
Calcium: 7.9 mg/dL — ABNORMAL LOW (ref 8.9–10.3)
Chloride: 102 mmol/L (ref 98–111)
Creatinine, Ser: 1.32 mg/dL — ABNORMAL HIGH (ref 0.44–1.00)
GFR, Estimated: 59 mL/min — ABNORMAL LOW (ref >60)
Glucose, Bld: 224 mg/dL — ABNORMAL HIGH (ref 70–99)
Potassium: 4.2 mmol/L (ref 3.5–5.1)
Sodium: 132 mmol/L — ABNORMAL LOW (ref 135–145)
Total Bilirubin: 0.3 mg/dL (ref 0.3–1.2)
Total Protein: 5.6 g/dL — ABNORMAL LOW (ref 6.5–8.1)

## 2022-08-22 LAB — PROTEIN / CREATININE RATIO, URINE
Creatinine, Urine: 219 mg/dL
Protein Creatinine Ratio: 13.56 mg/mg{Cre} — ABNORMAL HIGH (ref 0.00–0.15)
Total Protein, Urine: 2969 mg/dL

## 2022-08-22 LAB — URINE DRUG SCREEN, QUALITATIVE (ARMC ONLY)
Amphetamines, Ur Screen: NOT DETECTED
Barbiturates, Ur Screen: NOT DETECTED
Benzodiazepine, Ur Scrn: NOT DETECTED
Cannabinoid 50 Ng, Ur ~~LOC~~: POSITIVE — AB
Cocaine Metabolite,Ur ~~LOC~~: NOT DETECTED
MDMA (Ecstasy)Ur Screen: NOT DETECTED
Methadone Scn, Ur: NOT DETECTED
Opiate, Ur Screen: POSITIVE — AB
Phencyclidine (PCP) Ur S: NOT DETECTED
Tricyclic, Ur Screen: NOT DETECTED

## 2022-08-22 LAB — ECHOCARDIOGRAM COMPLETE
AR max vel: 2.01 cm2
AV Area VTI: 2.33 cm2
AV Area mean vel: 1.97 cm2
AV Mean grad: 2 mmHg
AV Peak grad: 3.1 mmHg
Ao pk vel: 0.88 m/s
Area-P 1/2: 6.07 cm2
Height: 59 in
S' Lateral: 1.5 cm
Weight: 1922.41 oz

## 2022-08-22 LAB — PROTIME-INR
INR: 1.4 — ABNORMAL HIGH (ref 0.8–1.2)
Prothrombin Time: 16.9 seconds — ABNORMAL HIGH (ref 11.4–15.2)

## 2022-08-22 LAB — BRAIN NATRIURETIC PEPTIDE: B Natriuretic Peptide: 606.9 pg/mL — ABNORMAL HIGH (ref 0.0–100.0)

## 2022-08-22 LAB — GLUCOSE, CAPILLARY
Glucose-Capillary: 311 mg/dL — ABNORMAL HIGH (ref 70–99)
Glucose-Capillary: 313 mg/dL — ABNORMAL HIGH (ref 70–99)
Glucose-Capillary: 360 mg/dL — ABNORMAL HIGH (ref 70–99)

## 2022-08-22 LAB — MRSA NEXT GEN BY PCR, NASAL: MRSA by PCR Next Gen: NOT DETECTED

## 2022-08-22 LAB — RESP PANEL BY RT-PCR (RSV, FLU A&B, COVID)  RVPGX2
Influenza A by PCR: NEGATIVE
Influenza B by PCR: NEGATIVE
Resp Syncytial Virus by PCR: NEGATIVE
SARS Coronavirus 2 by RT PCR: NEGATIVE

## 2022-08-22 LAB — POC URINE PREG, ED: Preg Test, Ur: NEGATIVE

## 2022-08-22 LAB — LACTIC ACID, PLASMA
Lactic Acid, Venous: 2.3 mmol/L (ref 0.5–1.9)
Lactic Acid, Venous: 3.8 mmol/L (ref 0.5–1.9)

## 2022-08-22 LAB — TROPONIN I (HIGH SENSITIVITY)
Troponin I (High Sensitivity): 50 ng/L — ABNORMAL HIGH (ref <18)
Troponin I (High Sensitivity): 37 ng/L — ABNORMAL HIGH (ref <18)

## 2022-08-22 LAB — PROCALCITONIN: Procalcitonin: 0.73 ng/mL

## 2022-08-22 LAB — APTT: aPTT: >200 seconds (ref 24–36)

## 2022-08-22 SURGERY — PULMONARY THROMBECTOMY
Anesthesia: Moderate Sedation | Laterality: Bilateral

## 2022-08-22 MED ORDER — MIDAZOLAM HCL 2 MG/ML PO SYRP: 8.0000 mg | ORAL_SOLUTION | Freq: Once | ORAL | Status: DC | PRN

## 2022-08-22 MED ORDER — SODIUM CHLORIDE 0.9 % IV SOLN: INTRAVENOUS | Status: DC

## 2022-08-22 MED ORDER — FENTANYL CITRATE PF 50 MCG/ML IJ SOSY
PREFILLED_SYRINGE | INTRAMUSCULAR | Status: AC
  Filled 2022-08-22: qty 1

## 2022-08-22 MED ORDER — CHLORHEXIDINE GLUCONATE CLOTH 2 % EX PADS: 6.0000 | MEDICATED_PAD | Freq: Every day | CUTANEOUS | Status: DC

## 2022-08-22 MED ORDER — ATORVASTATIN CALCIUM 20 MG PO TABS
40.0000 mg | ORAL_TABLET | Freq: Every day | ORAL | Status: DC
  Administered 2022-08-22 – 2022-08-25 (×4): 40 mg via ORAL
  Filled 2022-08-22 (×4): qty 2

## 2022-08-22 MED ORDER — BISACODYL 5 MG PO TBEC: 5.0000 mg | DELAYED_RELEASE_TABLET | Freq: Every day | ORAL | Status: DC | PRN

## 2022-08-22 MED ORDER — NICOTINE 14 MG/24HR TD PT24
14.0000 mg | MEDICATED_PATCH | Freq: Every day | TRANSDERMAL | Status: DC
  Administered 2022-08-23: 14 mg via TRANSDERMAL
  Filled 2022-08-22 (×3): qty 1

## 2022-08-22 MED ORDER — ACETAMINOPHEN 325 MG PO TABS
650.0000 mg | ORAL_TABLET | Freq: Four times a day (QID) | ORAL | Status: DC | PRN
  Administered 2022-08-24: 650 mg via ORAL
  Filled 2022-08-22: qty 2

## 2022-08-22 MED ORDER — METHYLPREDNISOLONE SODIUM SUCC 125 MG IJ SOLR
125.0000 mg | Freq: Every day | INTRAMUSCULAR | Status: AC
  Administered 2022-08-22 – 2022-08-24 (×3): 125 mg via INTRAVENOUS
  Filled 2022-08-22 (×3): qty 2

## 2022-08-22 MED ORDER — MIDAZOLAM HCL 5 MG/5ML IJ SOLN
INTRAMUSCULAR | Status: AC
  Filled 2022-08-22: qty 5

## 2022-08-22 MED ORDER — VANCOMYCIN HCL 750 MG/150ML IV SOLN
750.0000 mg | INTRAVENOUS | Status: DC
  Filled 2022-08-22: qty 150

## 2022-08-22 MED ORDER — MORPHINE SULFATE (PF) 4 MG/ML IV SOLN
4.0000 mg | Freq: Once | INTRAVENOUS | Status: AC
  Administered 2022-08-22: 4 mg via INTRAVENOUS
  Filled 2022-08-22: qty 1

## 2022-08-22 MED ORDER — MIDAZOLAM HCL 2 MG/2ML IJ SOLN
INTRAMUSCULAR | Status: DC | PRN
  Administered 2022-08-22: 2 mg via INTRAVENOUS

## 2022-08-22 MED ORDER — LACTATED RINGERS IV BOLUS
1000.0000 mL | Freq: Once | INTRAVENOUS | Status: AC
  Administered 2022-08-22: 1000 mL via INTRAVENOUS

## 2022-08-22 MED ORDER — SODIUM CHLORIDE 0.9 % IV SOLN
2.0000 g | Freq: Two times a day (BID) | INTRAVENOUS | Status: DC
  Administered 2022-08-22 – 2022-08-24 (×4): 2 g via INTRAVENOUS
  Filled 2022-08-22 (×2): qty 2
  Filled 2022-08-22: qty 12.5
  Filled 2022-08-22: qty 2

## 2022-08-22 MED ORDER — FENTANYL CITRATE (PF) 100 MCG/2ML IJ SOLN
INTRAMUSCULAR | Status: DC | PRN
  Administered 2022-08-22 (×2): 25 ug via INTRAVENOUS

## 2022-08-22 MED ORDER — IODIXANOL 320 MG/ML IV SOLN
INTRAVENOUS | Status: DC | PRN
  Administered 2022-08-22: 55 mL via INTRAVENOUS

## 2022-08-22 MED ORDER — MORPHINE SULFATE (PF) 2 MG/ML IV SOLN: 2.0000 mg | INTRAVENOUS | Status: DC | PRN

## 2022-08-22 MED ORDER — DIPHENHYDRAMINE HCL 50 MG/ML IJ SOLN: 50.0000 mg | Freq: Once | INTRAMUSCULAR | Status: DC | PRN

## 2022-08-22 MED ORDER — CEFAZOLIN SODIUM-DEXTROSE 2-4 GM/100ML-% IV SOLN
2.0000 g | INTRAVENOUS | Status: DC
  Filled 2022-08-22: qty 100

## 2022-08-22 MED ORDER — DOCUSATE SODIUM 100 MG PO CAPS
100.0000 mg | ORAL_CAPSULE | Freq: Two times a day (BID) | ORAL | Status: DC
  Administered 2022-08-23 – 2022-08-25 (×3): 100 mg via ORAL
  Filled 2022-08-22 (×5): qty 1

## 2022-08-22 MED ORDER — SULFAMETHOXAZOLE-TRIMETHOPRIM 800-160 MG PO TABS
1.0000 | ORAL_TABLET | ORAL | Status: DC
  Administered 2022-08-23 – 2022-08-25 (×2): 1 via ORAL
  Filled 2022-08-22 (×2): qty 1

## 2022-08-22 MED ORDER — OXYCODONE HCL 5 MG PO TABS
5.0000 mg | ORAL_TABLET | ORAL | Status: DC | PRN
  Administered 2022-08-22 – 2022-08-24 (×3): 5 mg via ORAL
  Filled 2022-08-22 (×4): qty 1

## 2022-08-22 MED ORDER — SODIUM CHLORIDE 0.9 % IV BOLUS
INTRAVENOUS | Status: DC | PRN
  Administered 2022-08-22: 500 mL via INTRAVENOUS

## 2022-08-22 MED ORDER — POLYETHYLENE GLYCOL 3350 17 G PO PACK: 17.0000 g | PACK | Freq: Every day | ORAL | Status: DC | PRN

## 2022-08-22 MED ORDER — LACTATED RINGERS IV SOLN: INTRAVENOUS | Status: AC

## 2022-08-22 MED ORDER — FENTANYL CITRATE PF 50 MCG/ML IJ SOSY: 12.5000 ug | PREFILLED_SYRINGE | Freq: Once | INTRAMUSCULAR | Status: DC | PRN

## 2022-08-22 MED ORDER — INSULIN GLARGINE-YFGN 100 UNIT/ML ~~LOC~~ SOLN
20.0000 [IU] | Freq: Every day | SUBCUTANEOUS | Status: DC
  Administered 2022-08-22 – 2022-08-24 (×3): 20 [IU] via SUBCUTANEOUS
  Filled 2022-08-22 (×5): qty 0.2

## 2022-08-22 MED ORDER — ONDANSETRON HCL 4 MG/2ML IJ SOLN: 4.0000 mg | Freq: Four times a day (QID) | INTRAMUSCULAR | Status: DC | PRN

## 2022-08-22 MED ORDER — HYDRALAZINE HCL 20 MG/ML IJ SOLN: 5.0000 mg | INTRAMUSCULAR | Status: DC | PRN

## 2022-08-22 MED ORDER — IOHEXOL 350 MG/ML SOLN
75.0000 mL | Freq: Once | INTRAVENOUS | Status: AC | PRN
  Administered 2022-08-22: 75 mL via INTRAVENOUS

## 2022-08-22 MED ORDER — SODIUM CHLORIDE 0.9 % IV SOLN
500.0000 mg | INTRAVENOUS | Status: DC
  Administered 2022-08-23 – 2022-08-24 (×2): 500 mg via INTRAVENOUS
  Filled 2022-08-22: qty 5
  Filled 2022-08-22: qty 500

## 2022-08-22 MED ORDER — SODIUM CHLORIDE 0.9% FLUSH
3.0000 mL | Freq: Two times a day (BID) | INTRAVENOUS | Status: DC
  Administered 2022-08-22 – 2022-08-25 (×5): 3 mL via INTRAVENOUS

## 2022-08-22 MED ORDER — FAMOTIDINE 20 MG PO TABS: 40.0000 mg | ORAL_TABLET | Freq: Once | ORAL | Status: DC | PRN

## 2022-08-22 MED ORDER — ALTEPLASE 2 MG IJ SOLR
INTRAMUSCULAR | Status: DC | PRN
  Administered 2022-08-22: 8 mg

## 2022-08-22 MED ORDER — ONDANSETRON HCL 4 MG/2ML IJ SOLN
4.0000 mg | Freq: Four times a day (QID) | INTRAMUSCULAR | Status: DC | PRN
  Administered 2022-08-22: 4 mg via INTRAVENOUS
  Filled 2022-08-22: qty 2

## 2022-08-22 MED ORDER — CHLORHEXIDINE GLUCONATE CLOTH 2 % EX PADS
6.0000 | MEDICATED_PAD | Freq: Once | CUTANEOUS | Status: AC
  Administered 2022-08-22: 6 via TOPICAL

## 2022-08-22 MED ORDER — SODIUM CHLORIDE 0.9 % IV SOLN
500.0000 mg | Freq: Once | INTRAVENOUS | Status: AC
  Administered 2022-08-22: 500 mg via INTRAVENOUS
  Filled 2022-08-22: qty 5

## 2022-08-22 MED ORDER — HEPARIN BOLUS VIA INFUSION
3700.0000 [IU] | Freq: Once | INTRAVENOUS | Status: AC
  Administered 2022-08-22: 3700 [IU] via INTRAVENOUS
  Filled 2022-08-22: qty 3700

## 2022-08-22 MED ORDER — INSULIN ASPART 100 UNIT/ML IJ SOLN
0.0000 [IU] | Freq: Every day | INTRAMUSCULAR | Status: DC
  Administered 2022-08-22 – 2022-08-23 (×2): 5 [IU] via SUBCUTANEOUS
  Administered 2022-08-24: 4 [IU] via SUBCUTANEOUS
  Filled 2022-08-22 (×3): qty 1

## 2022-08-22 MED ORDER — LACTATED RINGERS IV BOLUS
500.0000 mL | Freq: Once | INTRAVENOUS | Status: AC
  Administered 2022-08-22: 500 mL via INTRAVENOUS

## 2022-08-22 MED ORDER — ALBUTEROL SULFATE (2.5 MG/3ML) 0.083% IN NEBU: 2.5000 mg | INHALATION_SOLUTION | RESPIRATORY_TRACT | Status: DC | PRN

## 2022-08-22 MED ORDER — INSULIN ASPART 100 UNIT/ML IJ SOLN
0.0000 [IU] | Freq: Three times a day (TID) | INTRAMUSCULAR | Status: DC
  Administered 2022-08-22: 7 [IU] via SUBCUTANEOUS
  Administered 2022-08-23: 5 [IU] via SUBCUTANEOUS
  Administered 2022-08-23 (×2): 2 [IU] via SUBCUTANEOUS
  Administered 2022-08-24: 7 [IU] via SUBCUTANEOUS
  Administered 2022-08-25: 5 [IU] via SUBCUTANEOUS
  Filled 2022-08-22 (×5): qty 1

## 2022-08-22 MED ORDER — HEPARIN (PORCINE) 25000 UT/250ML-% IV SOLN
1050.0000 [IU]/h | INTRAVENOUS | Status: DC
  Administered 2022-08-22: 900 [IU]/h via INTRAVENOUS
  Filled 2022-08-22: qty 250

## 2022-08-22 MED ORDER — SODIUM CHLORIDE 0.9 % IV SOLN
2.0000 g | Freq: Once | INTRAVENOUS | Status: AC
  Administered 2022-08-22: 2 g via INTRAVENOUS
  Filled 2022-08-22: qty 12.5

## 2022-08-22 MED ORDER — ALTEPLASE 2 MG IJ SOLR
INTRAMUSCULAR | Status: AC
  Filled 2022-08-22: qty 8

## 2022-08-22 MED ORDER — ACETAMINOPHEN 650 MG RE SUPP: 650.0000 mg | Freq: Four times a day (QID) | RECTAL | Status: DC | PRN

## 2022-08-22 MED ORDER — ONDANSETRON HCL 4 MG PO TABS: 4.0000 mg | ORAL_TABLET | Freq: Four times a day (QID) | ORAL | Status: DC | PRN

## 2022-08-22 MED ORDER — METHYLPREDNISOLONE SODIUM SUCC 125 MG IJ SOLR: 125.0000 mg | Freq: Once | INTRAMUSCULAR | Status: DC | PRN

## 2022-08-22 MED ORDER — HYDROMORPHONE HCL 1 MG/ML IJ SOLN: 1.0000 mg | Freq: Once | INTRAMUSCULAR | Status: DC | PRN

## 2022-08-22 MED ORDER — HEPARIN SODIUM (PORCINE) 1000 UNIT/ML IJ SOLN
INTRAMUSCULAR | Status: AC
  Filled 2022-08-22: qty 10

## 2022-08-22 MED ORDER — VANCOMYCIN HCL IN DEXTROSE 1-5 GM/200ML-% IV SOLN
1000.0000 mg | Freq: Once | INTRAVENOUS | Status: AC
  Administered 2022-08-22: 1000 mg via INTRAVENOUS
  Filled 2022-08-22: qty 200

## 2022-08-22 SURGICAL SUPPLY — 17 items
CANISTER PENUMBRA ENGINE (MISCELLANEOUS) IMPLANT
CATH ANGIO 5F PIGTAIL 100CM (CATHETERS) IMPLANT
CATH INDIGO 12XTORQ 100 (CATHETERS) IMPLANT
CATH INDIGO SEP 12 (CATHETERS) IMPLANT
CATH INFINITI JR4 5F (CATHETERS) IMPLANT
CLOSURE PERCLOSE PROSTYLE (VASCULAR PRODUCTS) IMPLANT
COVER PROBE ULTRASOUND 5X96 (MISCELLANEOUS) IMPLANT
GLIDEWIRE ADV .035X180CM (WIRE) IMPLANT
PACK ANGIOGRAPHY (CUSTOM PROCEDURE TRAY) ×2 IMPLANT
SHEATH BRITE TIP 6FRX11 (SHEATH) IMPLANT
SHEATH PINNACLE 11FRX10 (SHEATH) IMPLANT
SUT MNCRL AB 4-0 PS2 18 (SUTURE) IMPLANT
SUT PROLENE 0 CT 1 30 (SUTURE) IMPLANT
SYR MEDRAD MARK 7 150ML (SYRINGE) IMPLANT
TUBING CONTRAST HIGH PRESS 72 (TUBING) IMPLANT
WIRE GUIDERIGHT .035X150 (WIRE) IMPLANT
WIRE SUPRACORE 300CM (WIRE) IMPLANT

## 2022-08-22 NOTE — Progress Notes (Signed)
[facility]  ROUNDING NOTE   Subjective:   [name] was admitted to [facility] on 08/22/2022 for SOB (shortness of breath) [R06.02] Pulmonary embolism with acute cor pulmonale ([place]) [I26.09]  Patient presents with her mother who assists with history taking. Patient started a new job yesterday. Patient presents with shortness of breath. She was found to have acute pulmonary embolism. Seems that she ran out of her apixaban.   Patient was admitted to [facility] from 12/29 to 1/3 with acute flare of minimal change disease with acute kidney injury. She was discharged on prednisone and tacrolimus.   Patient has lost a considerable amount of weight in the last year.   Patient reports using Aleve.   Objective:  Vital signs in last 24 hours:  Temp:  [97.9 F (36.6 C)] 97.9 F (36.6 C) (02/20 0720) Pulse Rate:  [94-103] 103 (02/20 1130) Resp:  [21-30] 26 (02/20 1130) BP: (80-104)/(55-74) 86/74 (02/20 1130) SpO2:  [95 %-100 %] 100 % (02/20 1130) Weight:  [52.2 kg] 52.2 kg (02/20 0630)  Weight change:  Filed Weights   08/22/22 0630  Weight: 52.2 kg    Intake/Output: No intake/output data recorded.   Intake/Output this shift:  Total I/O In: 1000 [IV Piggyback:1000] Out: -   Physical Exam: General: NAD, ill appearing  Head: Normocephalic, atraumatic. Moist oral mucosal membranes  Eyes: Anicteric, PERRL  Neck: Supple, trachea midline  Lungs:  Clear to auscultation  Heart: Regular rate and rhythm  Abdomen:  Soft, nontender,   Extremities:  no peripheral edema.  Neurologic: Nonfocal, moving all four extremities  Skin: No lesions        Basic Metabolic Panel: Recent Labs  Lab 08/22/22 0708  NA 132*  K 4.2  CL 102  CO2 23  GLUCOSE 224*  BUN 32*  CREATININE 1.32*  CALCIUM 7.9*    Liver Function Tests: Recent Labs  Lab 08/22/22 0708  AST 19  ALT 17  ALKPHOS 185*  BILITOT 0.3  PROT 5.6*  ALBUMIN <1.5*   No results for input(s): "LIPASE",  "AMYLASE" in the last 168 hours. No results for input(s): "AMMONIA" in the last 168 hours.  CBC: Recent Labs  Lab 08/22/22 0708  WBC 15.2*  NEUTROABS 9.2*  HGB 15.6*  HCT 47.4*  MCV 84.6  PLT 300    Cardiac Enzymes: No results for input(s): "CKTOTAL", "CKMB", "CKMBINDEX", "TROPONINI" in the last 168 hours.  BNP: Invalid input(s): "POCBNP"  CBG: No results for input(s): "GLUCAP" in the last 168 hours.  Microbiology: Results for orders placed or performed during the hospital encounter of 08/22/22  Resp panel by RT-PCR (RSV, Flu A&B, Covid) Anterior Nasal Swab     Status: None   Collection Time: 08/22/22  7:08 AM   Specimen: Anterior Nasal Swab  Result Value Ref Range Status   SARS Coronavirus 2 by RT PCR NEGATIVE NEGATIVE Final    Comment: (NOTE) SARS-CoV-2 target nucleic acids are NOT DETECTED.  The SARS-CoV-2 RNA is generally detectable in upper respiratory specimens during the acute phase of infection. The lowest concentration of SARS-CoV-2 viral copies this assay can detect is 138 copies/mL. A negative result does not preclude SARS-Cov-2 infection and should not be used as the sole basis for treatment or other patient management decisions. A negative result may occur with  improper specimen collection/handling, submission of specimen other than nasopharyngeal swab, presence of viral mutation(s) within the areas targeted by this assay, and inadequate number of viral copies(<138 copies/mL). A negative result  must be combined with clinical observations, patient history, and epidemiological information. The expected result is Negative.  Fact Sheet for Patients:  EntrepreneurPulse.com.au  Fact Sheet for Healthcare Providers:  IncredibleEmployment.be  This test is no t yet approved or cleared by the [place] FDA and  has been authorized for detection and/or diagnosis of SARS-CoV-2 by FDA under an Emergency Use Authorization  (EUA). This EUA will remain  in effect (meaning this test can be used) for the duration of the COVID-19 declaration under Section 564(b)(1) of the Act, 21 U.S.C.section 360bbb-3(b)(1), unless the authorization is terminated  or revoked sooner.       Influenza A by PCR NEGATIVE NEGATIVE Final   Influenza B by PCR NEGATIVE NEGATIVE Final    Comment: (NOTE) The Xpert Xpress SARS-CoV-2/FLU/RSV plus assay is intended as an aid in the diagnosis of influenza from Nasopharyngeal swab specimens and should not be used as a sole basis for treatment. Nasal washings and aspirates are unacceptable for Xpert Xpress SARS-CoV-2/FLU/RSV testing.  Fact Sheet for Patients: EntrepreneurPulse.com.au  Fact Sheet for Healthcare Providers: IncredibleEmployment.be  This test is not yet approved or cleared by the [place] FDA and has been authorized for detection and/or diagnosis of SARS-CoV-2 by FDA under an Emergency Use Authorization (EUA). This EUA will remain in effect (meaning this test can be used) for the duration of the COVID-19 declaration under Section 564(b)(1) of the Act, 21 U.S.C. section 360bbb-3(b)(1), unless the authorization is terminated or revoked.     Resp Syncytial Virus by PCR NEGATIVE NEGATIVE Final    Comment: (NOTE) Fact Sheet for Patients: EntrepreneurPulse.com.au  Fact Sheet for Healthcare Providers: IncredibleEmployment.be  This test is not yet approved or cleared by the [place] FDA and has been authorized for detection and/or diagnosis of SARS-CoV-2 by FDA under an Emergency Use Authorization (EUA). This EUA will remain in effect (meaning this test can be used) for the duration of the COVID-19 declaration under Section 564(b)(1) of the Act, 21 U.S.C. section 360bbb-3(b)(1), unless the authorization is terminated or revoked.  Performed at [facility], [address]  [place], [place] [address]     Coagulation Studies: No results for input(s): "LABPROT", "INR" in the last 72 hours.  Urinalysis: Recent Labs    08/22/22 0709  COLORURINE AMBER*  LABSPEC >1.046*  PHURINE 5.0  GLUCOSEU 150*  HGBUR SMALL*  BILIRUBINUR NEGATIVE  KETONESUR NEGATIVE  PROTEINUR >=300*  NITRITE NEGATIVE  LEUKOCYTESUR NEGATIVE      Imaging: CT Angio Chest PE W and/or Wo Contrast  Result Date: 08/22/2022 CLINICAL DATA:  Concern for pulmonary embolus. EXAM: CT ANGIOGRAPHY CHEST WITH CONTRAST TECHNIQUE: Multidetector CT imaging of the chest was performed using the standard protocol during bolus administration of intravenous contrast. Multiplanar CT image reconstructions and MIPs were obtained to evaluate the vascular anatomy. RADIATION DOSE REDUCTION: This exam was performed according to the departmental dose-optimization program which includes automated exposure control, adjustment of the mA and/or kV according to patient size and/or use of iterative reconstruction technique. CONTRAST:  56m OMNIPAQUE IOHEXOL 350 MG/ML SOLN COMPARISON:  CT abdomen and pelvis September 26, 2021. FINDINGS: Cardiovascular: Filling defects within the right main pulmonary artery extending into multiple segmental and subsegmental branches predominantly involving the right lower and middle lobes. As well there are multiple left-sided segmental and subsegmental pulmonary artery filling defects. Right heart enlargement with a right ventricular to left ventricular ratio of 1.9. Normal caliber thoracic aorta. Mediastinum/Nodes: No suspicious thyroid nodule. No pathologically enlarged mediastinal or  hilar lymph nodes. The esophagus is grossly unremarkable. Lungs/Pleura: Multifocal bilateral upper lobe predominant pulmonary nodules/masses many of which are cavitary. For reference: -cavitary right upper lobe pulmonary mass measures 3.4 x 2.0 cm on image 54/8 -cavitary left upper lobe pulmonary nodule measures 10 mm on  image 32/8. Bronchiectasis with clustered nodularity in the dependent bilateral lower lobes. Multifocal ground-glass opacities with subpleural sparing may reflect pulmonary hemorrhage or an infectious or inflammatory etiology. No pleural effusion.  No pneumothorax. Upper Abdomen: Reflux of contrast into the IVC and hepatic veins. Hyperdensity vs focus of hyperenhancement in the right upper pole kidney on image 121/6. Musculoskeletal: No acute osseous abnormality. Review of the MIP images confirms the above findings. IMPRESSION: 1. Acute bilateral pulmonary emboli with CT evidence of right heart strain (RV/LV Ratio = 1.9 ) consistent with at least submassive (intermediate risk) PE. The presence of right heart strain has been associated with an increased risk of morbidity and mortality. Please refer to the "Code PE Focused" order set in EPIC. 2. Multifocal bilateral upper lobe predominant pulmonary nodules/masses many of which are cavitary. Findings are favored to reflect an atypical infectious process such as septic emboli. 3. Multifocal ground-glass opacities with subpleural sparing likely reflects a combination of pulmonary hemorrhage and/or an infectious or inflammatory etiology. 4. Hyperdensity vs focus of hyperenhancement in the right upper pole kidney, nonspecific suggest further evaluation with CT of the abdomen and pelvis upon stabilization of patient. Critical Value/emergent results were called by telephone at the time of interpretation on 08/22/2022 at 9:15 am to provider Dr Bland Span, who verbally acknowledged these results. Electronically Signed   By: Dahlia Bailiff M.D.   On: 08/22/2022 09:15   DG Chest Portable 1 View  Result Date: 08/22/2022 CLINICAL DATA:  22 year old female with history of shortness of breath. EXAM: PORTABLE CHEST 1 VIEW COMPARISON:  Chest x-ray 09/27/2021. FINDINGS: Widespread areas of interstitial prominence and architectural distortion are noted throughout the mid to lower lungs  bilaterally (right greater than left), similar to the prior study from 09/26/2021, presumably areas of chronic post infectious or inflammatory scarring. No pleural effusions. No pneumothorax. No evidence of pulmonary edema. Heart size is normal. Upper mediastinal contours are within normal limits. IMPRESSION: 1. Grossly abnormal chest x-ray with findings which appear to be chronic based on comparison with prior chest radiographs. Lung bases were grossly abnormal on prior CT of the abdomen and pelvis 09/26/2021. Further evaluation with high-resolution chest CT is suggested to better delineate these findings which are very abnormal in a 22 year old patient. Electronically Signed   By: Vinnie Langton M.D.   On: 08/22/2022 06:58     Medications:    [START ON 08/23/2022] azithromycin (ZITHROMAX) 500 mg in sodium chloride 0.9 % 250 mL IVPB     [START ON 08/23/2022] ceFEPime (MAXIPIME) IV     heparin 900 Units/hr (08/22/22 1051)   lactated ringers     vancomycin     [START ON 08/23/2022] vancomycin      atorvastatin  40 mg Oral Daily   docusate sodium  100 mg Oral BID   insulin aspart  0-5 Units Subcutaneous QHS   insulin aspart  0-9 Units Subcutaneous TID WC   insulin glargine-yfgn  20 Units Subcutaneous QHS   nicotine  14 mg Transdermal Daily   sodium chloride flush  3 mL Intravenous Q12H   acetaminophen **OR** acetaminophen, albuterol, bisacodyl, hydrALAZINE, ondansetron **OR** ondansetron (ZOFRAN) IV, oxyCODONE, polyethylene glycol  Assessment/ Plan:  [name] is a  22 y.o. 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 Most recent urine with 5.6 grams of proteinuria on 07/13/2022. With  hypoalbuminemia Stress dose prednisone Restart TMP/SMX for PJP prophylaxis When blood pressure improves, will start patient on ARB/ACE-I May benefit from a SGLT-2 inhibitor  Acute pulmonary embolism: with hypoalbuminemia and nephrotic syndrome. Patient ran out of her apixaban.   Hypotension: Continue supportive care.   Secondary Hyperparathyroidism: not currently on an activated vitamin D.    LOS: 0 Awais Cobarrubias 2/20/202412:42 PM

## 2022-08-22 NOTE — Op Note (Signed)
[facility]  Percutaneous Study/Intervention Procedural Note   Date of Surgery: 08/22/2022,5:21 PM  Surgeon: Leotis Pain  Pre-operative Diagnosis: Symptomatic bilateral pulmonary emboli  Post-operative diagnosis:  Same  Procedure(s) Performed:  1.  Contrast injection right heart  2.  Thrombolysis with a total of 8 mg of tPA, 4 mg in each of the main pulmonary arteries  3.  Mechanical thrombectomy using the penumbra CAT 12 catheter to the right middle lobe, right lower lobe, and left lower lobe pulmonary arteries  4.  Selective catheter placement right lower lobe, middle lobe, and upper lobe pulmonary arteries  5.  Selective catheter placement left lower lobe and upper lobe pulmonary arteries    Anesthesia: Conscious sedation was administered under my direct supervision by the interventional radiology RN. IV Versed plus fentanyl were utilized. Continuous ECG, pulse oximetry and blood pressure was monitored throughout the entire procedure.  Versed and fentanyl were administered intravenously.  Conscious sedation was administered for a total of 38 minutes using 2 mg of Versed and 50 mcg of Fentanyl.  EBL: 350 cc  Sheath: 11 French right femoral vein  Contrast: 55 cc   Fluoroscopy Time: 13.4 minutes  Indications:  Patient presents with pulmonary emboli. The patient is symptomatic with hypoxemia and dyspnea on exertion.  There is evidence of right heart strain on the CT angiogram. The patient is otherwise a good candidate for intervention and even the long-term benefits pulmonary angiography with thrombolysis is offered. The risks and benefits are reviewed long-term benefits are discussed. All questions are answered patient agrees to proceed.  Procedure:  Catherine [name] a 22 y.o. female who was identified and appropriate procedural time out was performed.  The patient was then placed supine on the table and prepped and draped in the usual sterile fashion.   Ultrasound was used to evaluate the right common femoral vein.  It was patent, as it was echolucent and compressible.  A digital ultrasound image was acquired for the permanent record.  A Seldinger needle was used to access the right common femoral vein under direct ultrasound guidance.  A 0.035 J wire was advanced without resistance and a 5Fr sheath was placed and then device was placed and we then upsized to an 11 [place] sheath.    The wire and pigtail catheter were then negotiated into the right atrium and bolus injection of contrast was utilized to demonstrate the right ventricle and the pulmonary artery outflow. The advantage wire and JR4 catheter were then negotiated into the main pulmonary artery and then first into the right side.  I first cannulated the right middle lobe, and then advanced into the right upper lobe and lower lobe pulmonary arteries were selective imaging is performed.  Minimal thrombus burden was seen in the right upper lobe.  Moderate thrombus burden was seen in the right middle lobe.  Essentially completely occlusive thrombus burden was seen in the right lower lobe pulmonary artery.  I then advanced the JR4 catheter and the advantage wire up to the left side.  I selectively cannulated the left lower lobe pulmonary artery and then the left upper lobe pulmonary artery and selective imaging was performed in these vessels. Moderate thrombus burden was seen in the left lower lobe and small thrombus burden was seen in the left upper lobe.   TPA was reconstituted and delivered onto the table. A total of 8 milligrams of TPA was utilized.  4 mg was administered on the left side and 4 mg  was administered on the right side. This was then allowed to dwell.  The Penumbra Cat 12 catheter was then advanced up into the pulmonary vasculature. The right lung was addressed first. Catheter was negotiated into the right middle lobe pulmonary artery and mechanical thrombectomy was performed with the help  of the separator. Follow-up imaging demonstrated a good result and therefore the catheter was renegotiated into the right lower lobe pulmonary artery and again mechanical thrombectomy was performed. Passes were made with both the Penumbra catheter itself as well as introducing the separator. Follow-up imaging was then performed.  There was improvement still significant thrombus burden in the right lower lobe pulmonary artery and the right middle lobe pulmonary artery was clear.  The Penumbra Cat 12 catheter was then negotiated to the opposite side. The left lung was then addressed. Catheter was negotiated into the left lower lobe and mechanical thrombectomy was performed with the help of the separator. Follow-up imaging demonstrated a good result.  I then made the decision to go back to the right side to perform mechanical thrombectomy on the right lower lobe pulmonary artery further rather than get the small amount of thrombus out of the left upper lobe pulmonary artery.  I then redirected to the right lower lobe pulmonary artery with the CAT 12 catheter and for mechanical thrombectomy until we reached approximately 350 cc of blood loss.  There was a marked improvement in the right lower lobe pulmonary artery thrombus burden although some thrombus still remained in the right lower lobe pulmonary artery.  After review these images wires were reintroduced and the catheters removed. Then, the sheath is then pulled and the glide device is placed with hemostasis achieved.  The skin was closed with 4-0 Monocryl and a sterile dressing was placed.    Findings:   Right heart imaging:  Right atrium and right ventricle and the pulmonary outflow tract appears moderately dilated  Right lung:  Minimal thrombus burden was seen in the right upper lobe.  Moderate thrombus burden was seen in the right middle lobe.  Essentially completely occlusive thrombus burden was seen in the right lower lobe pulmonary artery.  Left  lung:  Moderate thrombus burden was seen in the left lower lobe and small thrombus burden was seen in the left upper lobe.     Disposition: Patient was taken to the recovery room in stable condition having tolerated the procedure well.  Leotis Pain 08/22/2022,5:21 PM

## 2022-08-22 NOTE — Progress Notes (Signed)
PHARMACY -  BRIEF ANTIBIOTIC NOTE   Pharmacy has received consult(s) for vancomycin and cefepime from an ED provider.  The patient's profile has been reviewed for ht/wt/allergies/indication/available labs.    One time order(s) placed for cefepime 2 gm IV x 1 and Vancomycin 1 gm x 1  Further antibiotics/pharmacy consults should be ordered by admitting physician if indicated.                       Thank you, Alison Murray 08/22/2022  10:48 AM

## 2022-08-22 NOTE — Inpatient Diabetes Management (Signed)
Inpatient Diabetes Program Recommendations  AACE/ADA: New Consensus Statement on Inpatient Glycemic Control (2015)  Target Ranges:  Prepandial:   less than 140 mg/dL      Peak postprandial:   less than 180 mg/dL (1-2 hours)      Critically ill patients:  140 - 180 mg/dL   Lab Results  Component Value Date   GLUCAP 107 (H) 09/30/2021   HGBA1C 13.1 (H) 09/04/2021    Review of Glycemic Control  Latest Reference Range & Units 08/22/22 07:08  Glucose 70 - 99 mg/dL 224 (H)   Diabetes history: DM type 1 Outpatient Diabetes medications: Lantus 30 units qhs, Novolog 5-8 units tid Current orders for Inpatient glycemic control: None being evaluated in ED   Inpatient Diabetes Program Recommendations:    -  Consider starting Novolog 0-9 units Q4 hours while NPO -  Start Semglee 15 units (if admitted) -  A1c level (if admitted)  Thanks,  Tama Headings RN, MSN, BC-ADM Inpatient Diabetes Coordinator Team Pager [phone] (8a-5p)

## 2022-08-22 NOTE — H&P (Signed)
History and Physical    Patient: Catherine [name] [id number] DOB: 2000/11/13 DOA: 08/22/2022 DOS: the patient was seen and examined on 08/22/2022 PCP: [facility]  Patient coming from: Home - lives with grandfather; Donald Prose: Mother, [phone]   Chief Complaint: SOB  HPI: Catherine [name] is a 22 y.o. female with medical history significant of DM and nephrotic/minimal change syndrome presenting with SOB.   She reports that she developed SOB starting earlier this week, present with movement.  She was coughing up green phlegm that started at the same time as the SOB.  Some wheezing.  No fever.  No sick contacts.  No leg pain/swelling.  Remote h/o collapsed lung, required chest tube and [facility] admission to [facility]; no other respiratory history.  Sje was diagnosed with nephrotic syndrome at age 5.  On/off prednisone, tacrolimus over the years. Relapsing/remitting.  Not on a transplant list.    ER Course:   Submassive PE.  Albumin <1.5, likely thrombotic from nephrotic syndrome.  On 2L.  Troponin 50.  +R heart strain.  Vascular consulted, ?thrombectomy vs. Thrombolysis.  Started on Heparin.  PCCM called - also with cavitary lung lesions.  WBC 15.  On Prograf, prednisone so at risk - covered with broad spectrum abx.  Pulm will consult if desired Darlyn Chamber).  Nephrology will also see.       Review of Systems: As mentioned in the history of present illness. All other systems reviewed and are negative. 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 No pertinent surgical history. Social History:  reports that she has been smoking e-cigarettes. She has never used smokeless tobacco. She reports that she does not currently use alcohol. She reports current drug use.  Drug: Marijuana.  No Known Allergies  History reviewed. No pertinent family history.  Prior to Admission medications   Medication Sig Start Date End Date Taking? Authorizing Provider  acetaminophen (TYLENOL) 325 MG tablet Take 325 mg by mouth every 6 (six) hours as needed. Patient not taking: Reported on 09/26/2021 01/14/17   [provider]  atorvastatin (LIPITOR) 40 MG tablet Take 1 tablet (40 mg total) by mouth daily. 09/06/21 12/05/21  Enzo Bi, MD  blood glucose meter kit and supplies Dispense based on patient and insurance preference. Use up to four times daily as directed. (FOR ICD-10 E10.9, E11.9). 09/06/21   Enzo Bi, MD  glucagon 1 MG injection Inject 1 mg into the muscle as needed. 02/26/19   [provider]  insulin aspart (NOVOLOG) 100 UNIT/ML injection Inject 5-8 Units into the skin 3 (three) times daily before meals. 09/06/21 12/05/21  Enzo Bi, MD  LANTUS 100 UNIT/ML injection Inject 0.3 mLs (30 Units total) into the skin at bedtime. 09/06/21 12/05/21  Enzo Bi, MD  ondansetron (ZOFRAN-ODT) 4 MG disintegrating tablet Take 1 tablet (4 mg total) by mouth every 8 (eight) hours as needed for nausea or vomiting. 09/26/21   Alfred Levins, [place], MD  oxyCODONE-acetaminophen (PERCOCET) 5-325 MG tablet Take 1 tablet by mouth every 4 (four) hours as needed for severe pain. 03/29/22 03/29/23  Merlyn Lot, MD  tacrolimus (PROGRAF) 1 MG capsule Take 2 mg by mouth 2 (two) times daily.    [provider]  torsemide (DEMADEX) 20 MG tablet Take 1 tablet (  20 mg total) by mouth daily. 10/01/21 10/31/21  Val Riles, MD    Physical Exam: Vitals:   08/22/22 0745 08/22/22 0800 08/22/22 0801 08/22/22 1026  BP: 104/67 (!) 85/55  (!) 80/71  Pulse: 98 99 100 (!) 102  Resp: (!) 29 (!) 23 (!) 28 (!) 28  Temp:      TempSrc:      SpO2: 96% 95% 95% 100%  Weight:      Height:       General:  Appears calm and comfortable and is in NAD, on [place] O2 Eyes:  PERRL, EOMI, normal lids,  iris ENT:  grossly normal hearing, lips & tongue, mmm; appropriate dentition Neck:  no LAD, masses or thyromegaly Cardiovascular:  RRR, no m/r/g. No LE edema.  Respiratory:   CTA bilaterally with no wheezes/rales/rhonchi.  Mildly increased respiratory effort. Abdomen:  soft, mildly diffusely tender, ND Back:   normal alignment, no CVAT Skin:  no rash or induration seen on limited exam Musculoskeletal:  grossly normal tone BUE/BLE, good ROM, no bony abnormality Psychiatric:  blunted mood and affect, speech fluent and appropriate, AOx3 Neurologic:  CN 2-12 grossly intact, moves all extremities in coordinated fashion   Radiological Exams on Admission: Independently reviewed - see discussion in A/P where applicable  CT Angio Chest PE W and/or Wo Contrast  Result Date: 08/22/2022 CLINICAL DATA:  Concern for pulmonary embolus. EXAM: CT ANGIOGRAPHY CHEST WITH CONTRAST TECHNIQUE: Multidetector CT imaging of the chest was performed using the standard protocol during bolus administration of intravenous contrast. Multiplanar CT image reconstructions and MIPs were obtained to evaluate the vascular anatomy. RADIATION DOSE REDUCTION: This exam was performed according to the departmental dose-optimization program which includes automated exposure control, adjustment of the mA and/or kV according to patient size and/or use of iterative reconstruction technique. CONTRAST:  59m OMNIPAQUE IOHEXOL 350 MG/ML SOLN COMPARISON:  CT abdomen and pelvis September 26, 2021. FINDINGS: Cardiovascular: Filling defects within the right main pulmonary artery extending into multiple segmental and subsegmental branches predominantly involving the right lower and middle lobes. As well there are multiple left-sided segmental and subsegmental pulmonary artery filling defects. Right heart enlargement with a right ventricular to left ventricular ratio of 1.9. Normal caliber thoracic aorta. Mediastinum/Nodes: No suspicious thyroid nodule. No  pathologically enlarged mediastinal or hilar lymph nodes. The esophagus is grossly unremarkable. Lungs/Pleura: Multifocal bilateral upper lobe predominant pulmonary nodules/masses many of which are cavitary. For reference: -cavitary right upper lobe pulmonary mass measures 3.4 x 2.0 cm on image 54/8 -cavitary left upper lobe pulmonary nodule measures 10 mm on image 32/8. Bronchiectasis with clustered nodularity in the dependent bilateral lower lobes. Multifocal ground-glass opacities with subpleural sparing may reflect pulmonary hemorrhage or an infectious or inflammatory etiology. No pleural effusion.  No pneumothorax. Upper Abdomen: Reflux of contrast into the IVC and hepatic veins. Hyperdensity vs focus of hyperenhancement in the right upper pole kidney on image 121/6. Musculoskeletal: No acute osseous abnormality. Review of the MIP images confirms the above findings. IMPRESSION: 1. Acute bilateral pulmonary emboli with CT evidence of right heart strain (RV/LV Ratio = 1.9 ) consistent with at least submassive (intermediate risk) PE. The presence of right heart strain has been associated with an increased risk of morbidity and mortality. Please refer to the "Code PE Focused" order set in EPIC. 2. Multifocal bilateral upper lobe predominant pulmonary nodules/masses many of which are cavitary. Findings are favored to reflect an atypical infectious process such as septic emboli. 3. Multifocal ground-glass opacities with subpleural  sparing likely reflects a combination of pulmonary hemorrhage and/or an infectious or inflammatory etiology. 4. Hyperdensity vs focus of hyperenhancement in the right upper pole kidney, nonspecific suggest further evaluation with CT of the abdomen and pelvis upon stabilization of patient. Critical Value/emergent results were called by telephone at the time of interpretation on 08/22/2022 at 9:15 am to provider Dr Bland Span, who verbally acknowledged these results. Electronically Signed   By:  Dahlia Bailiff M.D.   On: 08/22/2022 09:15   DG Chest Portable 1 View  Result Date: 08/22/2022 CLINICAL DATA:  22 year old female with history of shortness of breath. EXAM: PORTABLE CHEST 1 VIEW COMPARISON:  Chest x-ray 09/27/2021. FINDINGS: Widespread areas of interstitial prominence and architectural distortion are noted throughout the mid to lower lungs bilaterally (right greater than left), similar to the prior study from 09/26/2021, presumably areas of chronic post infectious or inflammatory scarring. No pleural effusions. No pneumothorax. No evidence of pulmonary edema. Heart size is normal. Upper mediastinal contours are within normal limits. IMPRESSION: 1. Grossly abnormal chest x-ray with findings which appear to be chronic based on comparison with prior chest radiographs. Lung bases were grossly abnormal on prior CT of the abdomen and pelvis 09/26/2021. Further evaluation with high-resolution chest CT is suggested to better delineate these findings which are very abnormal in a 22 year old patient. Electronically Signed   By: Vinnie Langton M.D.   On: 08/22/2022 06:58    EKG: Independently reviewed.  NSR with rate 95; no evidence of acute ischemia   Labs on Admission: I have personally reviewed the available labs and imaging studies at the time of the admission.  Pertinent labs:    Na++ 132 Glucose 224 BUN 32/Creatinine 1.32/GFR 59 - improved AP 185 Albumin <1.5 BNP 606.9 HS troponin 50 Procalcitonin 0.73 WBC 15.2 Hgb 15.6 COVID/flu/RSV negative UA: 150 glucose, small Hgb, >300 protein, rare bacteria   Assessment and Plan: Principal Problem:   Pulmonary embolism with acute cor pulmonale (HCC) Active Problems:   Dyslipidemia   Diabetes mellitus without complication (HCC)   Hypoalbuminemia   Marijuana abuse   Tobacco dependence    Pulmonary embolism with R heart strain -Patient without prior episodes of thromboembolic disease presenting with new submassive PE -She was  hospitalized at [facility] in January and was discharged on Eliquis due her significantly hypoalbuminemia; she ran out of the Eliquis and did not obtain refills -R heart strain was seen on CT -Troponin and BNP are elevated; stat echo has been ordered to assess the severity of clot burden with R heart strain -PESI score is Class II, low risk, indicating a 1.7-3.5% 30-day mortality risk -S-PESI score is high based on her BP, indicating that the patient has an 8.9 risk of death  -With high S-PESI and both R heart strain AND elevated biomarkers, she is at high intermediate risk and vascular surgery has been consulted for consideration of intervention; this does not improve mortality risk but does lower the risk of pulmonary HTN and is associated with lower O2 requirement and a faster time to discharge -As a result, she will be taken for thrombectomy by vascular surgery this afternoon -Will admit to stepdown unit -Initiate anticoagulation - for now, will start treatment-dose heparin with plan to transition to Eliquis once hemodynamically stable -[place] O2 as needed -Smoking cessation has been strongly encouraged - both tobacco and marijuana -Based on her severely low albumin she is at high risk (>8%/year) for recurrent VTE and should be maintained on indefinite AC. -The patient understands  that thromboembolic disease can be catastrophic and even deadly and that she must be complaint with physician appointments and anticoagulation. 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 associated with chronic immunosuppression -Will continue Cefepime, Azithromycin and Vanc for now (stop Vanc if MRSA PCR is negative) -Pulm consult  Nephrotic syndrome -Patient with long-standing DM and minimal change  disease -Renal function appears to be stable at this time after her recent flare; she was hospitalized at [facility] from 12/29-1/3 and treated with high-dose prednisone (supposed to be continued for at least 4 weeks) and tacrolimus -She is at high risk for worsening long-term and may need HD and/or transplant -Strict compliance strongly encouraged -Intermittent takes tacrolimus and prednisone (holding for now) -Also with apparent hyperdensity focus of the kidney on imaging; likely needs a dedicated CT A/P once more stable -Prescribed Bactrim 3x/week but not taking -She missed her f/u nephrology appointment about 1/25 and will need to reschedule -Nephrology has been consulted by the EDP  Uncontrolled DM -Poorly controlled, most recent A1c was 9.6 on 12/29 -Continue glargine, has declined an insulin pump -Cover with sensitive-scale SSI  -Followed at [facility], last seen on 1/17; she is due for f/u in mid-April  Hypoalbuminemia -Noted while at [facility] in early January and started on Eliquis for DVT prevention -She took a one-month supply and then ran out  -She did not understand why she was taking it and so did not get it refilled -Nutrition consult -Will need ongoing Eliquis after treatment of acute VTE  HLD -Continue atorvastatin  Tobacco abuse -Cessation encouraged -Will order nicotine patch (patient may decline, reports she doesn't vape "that often")  Marijuana abuse -Cessation encouraged; this should be encouraged on an ongoing basis -UDS ordered     Total critical care time: 55 minutes Critical care time was exclusive of separately billable procedures and treating other patients. Critical care was necessary to treat or prevent imminent or life-threatening deterioration. Critical care was time spent personally by me on the following activities: development of treatment plan with patient and/or surrogate as well as nursing, discussions with consultants, evaluation of patient's response to  treatment, examination of patient, obtaining history from patient or surrogate, ordering and performing treatments and interventions, ordering and review of laboratory studies, ordering and review of radiographic studies, pulse oximetry and re-evaluation of patient's condition.     Advance Care Planning:   Code Status: Full Code - Code status was discussed with the patient and/or family at the time of admission.  The patient would want to receive full resuscitative measures at this time.   Consults: Vascular surgery; nephrology; pulmonology; TOC team; nutrition, TOC team  DVT Prophylaxis: Heparin drip  Family Communication: Mother was present throughout evaluation  Severity of Illness: The appropriate patient status for this patient is INPATIENT. Inpatient status is judged to be reasonable and necessary in order to provide the required intensity of service to ensure the patient's safety. The patient's presenting symptoms, physical exam findings, and initial radiographic and laboratory data in the context of their chronic comorbidities is felt to place them at high risk for further clinical deterioration. Furthermore, it is not anticipated that the patient will be medically stable for discharge from the hospital within 2 midnights of admission.   * I certify that  at the point of admission it is my clinical judgment that the patient will require inpatient hospital care spanning beyond 2 midnights from the point of admission due to high intensity of service, high risk for further deterioration and high frequency of surveillance required.*  Author: Karmen Bongo, MD 08/22/2022 11:37 AM  For on call review [email or web address].

## 2022-08-22 NOTE — ED Provider Notes (Signed)
[facility] Provider Note    Event Date/Time   First MD Initiated Contact with Patient 08/22/22 [phone]     (approximate)   History   Shortness of Breath   HPI  Catherine [name] is a 22 y.o. female past medical history of diabetes, nephrotic syndrome who presents with back pain cough and dyspnea.  She notes that she has had cough productive of green sputum for about a week.  For the last 2 days had upper back pain as well as dyspnea on exertion.  She denies lower extremity swelling or lower extremity pain.  Denies hemoptysis.  No fever or chills.  Has had some vomiting and diarrhea but denies abdominal pain.  History of DVT/PE.     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 ([place]) [id number]   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   Proteinuria 08/23/2019   DM (diabetes mellitus) type I uncontrolled with renal manifestation 08/23/2019   AKI (acute kidney injury) ([place]) 08/21/2019   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    Hyponatremia    Acidosis    Type 1 diabetes mellitus with hyperglycemia ([place]) 10/12/2005     Physical Exam  Triage Vital Signs: ED Triage Vitals  Enc Vitals Group     BP 08/22/22 0637 98/70     Pulse Rate 08/22/22 0637 94     Resp 08/22/22 0637 (!) 21     Temp --      Temp src --      SpO2 08/22/22 0637 99 %      Weight 08/22/22 0630 115 lb (52.2 kg)     Height 08/22/22 0630 4' 11"$  (1.499 m)     Head Circumference --      Peak Flow --      Pain Score 08/22/22 0630 8     Pain Loc --      Pain Edu? --      Excl. in [place]? --     Most recent vital signs: Vitals:   08/22/22 0637  BP: 98/70  Pulse: 94  Resp: (!) 21  SpO2: 99%     General: Awake, no distress.  CV:  Good peripheral perfusion.  Resp:  Normal effort.  Lung sounds are clear Abd:  No distention.  Abdomen is soft nontender Neuro:             Awake, Alert, Oriented x 3  Other:     ED Results / Procedures / Treatments  Labs (all labs ordered are listed, but only abnormal results are displayed) Labs Reviewed  RESP PANEL BY RT-PCR (RSV, FLU A&B, COVID)  [id number]  CBC WITH DIFFERENTIAL/PLATELET  COMPREHENSIVE METABOLIC PANEL  URINALYSIS, ROUTINE W REFLEX MICROSCOPIC  BRAIN NATRIURETIC PEPTIDE  POC URINE PREG, ED  TROPONIN I (HIGH SENSITIVITY)     EKG  EKG interpretation performed by myself: NSR, nml axis, nml intervals, no TWI V3  RADIOLOGY Chest x-ray reviewed interpreted by myself shows interstitial prominence   PROCEDURES:  Critical Care performed: No  .1-3 Lead EKG Interpretation  Performed by: Rada Hay, MD Authorized by: Rada Hay, MD     Interpretation: normal     ECG rate assessment: normal     Rhythm: sinus rhythm     Ectopy: none     Conduction: normal     The patient is on the cardiac monitor to evaluate for evidence of arrhythmia and/or significant heart rate changes.   MEDICATIONS ORDERED IN ED: Medications  morphine (PF) 4 MG/ML injection 4 mg (has no administration in time range)  lactated ringers bolus 1,000 mL (1,000 mLs Intravenous New Bag/Given 08/22/22 0722)     IMPRESSION / MDM / ASSESSMENT AND PLAN / ED COURSE  I reviewed the triage vital signs and the nursing notes.                              Patient's presentation is most consistent with acute  complicated illness / injury requiring diagnostic workup.  Differential diagnosis includes, but is not limited to, pneumonia, viral illness, pleurisy, pulmonary embolism, CHF, anemia, worsening renal failure  Patient is a 22 year old female who has minimal-change disease causing nephrotic syndrome and type 1 diabetes who presents because of upper back chest pain as well as cough and dyspnea.  Her cough has been going on for a week the dyspnea upper back and chest pain have been present for about 2 days.  Cough is productive of green sputum she has been afebrile.  Denies lower extremity swelling.  Patient's vital signs are notable for borderline soft blood pressure tachypnea heart rate in the 90s.  EKG shows T wave version lead V3.  On exam overall she does not look toxic lung sounds are clear no signs of DVT on exam.  Abdomen is benign.  Reviewed patient's chest x-ray which shows abnormalities that seem to have been present before.  She has interstitial prominence and what radiology calls architectural distortion.  Differential is as above.  Patient needs to be further evaluate for underlying pneumonia and pulmonary embolism.  Will obtain CBC CMP troponin BNP and CT angio of the chest.  Will give bolus of fluid.      FINAL CLINICAL IMPRESSION(S) / ED DIAGNOSES   Final diagnoses:  SOB (shortness of breath)     Rx / DC Orders   ED Discharge Orders     None        Note:  This document was prepared using Dragon voice recognition software and may include unintentional dictation errors.   Rada Hay, MD 08/22/22 [phone]

## 2022-08-22 NOTE — Consult Note (Signed)
ANTICOAGULATION CONSULT NOTE - Initial Consult  Pharmacy Consult for heparin Indication: pulmonary embolus  No Known Allergies  Patient Measurements: Height: 4' 11"$  (149.9 cm) Weight: 52.2 kg (115 lb) IBW/kg (Calculated) : 43.2 Heparin Dosing Weight: 52.2 kg  Vital Signs: Temp: 97.9 F (36.6 C) (02/20 0720) Temp Source: Oral (02/20 0720) BP: 80/71 (02/20 1026) Pulse Rate: 102 (02/20 1026)  Labs: Recent Labs    08/22/22 0708  HGB 15.6*  HCT 47.4*  PLT 300  CREATININE 1.32*  TROPONINIHS 50*    Estimated Creatinine Clearance: 49.4 mL/min (A) (by C-G formula based on SCr of 1.32 mg/dL (H)).   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  Spoke with patient's mother, apixaban was stopped and patient has not taken in weeks.  Assessment: Pharmacy has been consulted to dose heparin in this 22 yo F who presents to the ED with CC of SOB/Chest pain.  CT reveals submassive PE.    CrCl 49.4  Baseline labs: Hgb 15.6, Hct 47.4, aPTT/INR ordered. Goal of Therapy:  Heparin level 0.3-0.7 units/ml Monitor platelets by anticoagulation protocol: Yes   Plan:  Give 3700 units bolus x 1 Start heparin infusion at 900 units/hr Check anti-Xa level in 6 hours and daily while on heparin Continue to monitor H&H and platelets  Alison Murray 08/22/2022,10:30 AM

## 2022-08-22 NOTE — Progress Notes (Signed)
Lab came to draw labs and was able to get some but not all labs that are ordered. Patient refusing to get stuck again and wants to wait until the morning.

## 2022-08-22 NOTE — Progress Notes (Signed)
*  PRELIMINARY RESULTS* Echocardiogram 2D Echocardiogram has been performed.  Catherine [name] 08/22/2022, 1:40 PM

## 2022-08-22 NOTE — Progress Notes (Signed)
Notified Dr. Lorin Mercy of critical lactic acid 2.3 and PTT > 200. MD acknowledged and gave no new orders.

## 2022-08-22 NOTE — Consult Note (Signed)
NAME:  [name], MRN:  [id number], DOB:  [birth year], LOS: 0 ADMISSION DATE:  08/22/2022, CONSULTATION DATE:  08/22/2022 REFERRING MD:  Karmen Bongo, MD, CHIEF COMPLAINT:  Cavitary Pulmonary Lesions   History of Present Illness:  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 She presents to the [name] with shortness of breath.  Patient presents for sudden onset increase in shortness of breath of a few days in duration. She reports her symptoms started around Saturday. She reports some associated dizziness but denies loss of consciousness. This is associated with an increased cough productive of greenish sputum over the past 3 to 4 days. She reports a dry cough for over 3 to 4 weeks. No fevers or chills reported, no night sweats or weight loss. Patient denies any rashes or skin changes or ulcerations. She has no chest pain or chest tightness. Patient was somnolent at the time of our exam after she had received pain medications and her answers were very brief.  She was started on Eliquis 07/06/2022 following an admission to [name] (06/30/2022 - 07/05/2022) for AKI and reportedly ran out of it. She is followed by nephrology outpatient for minimal change disease. Patient is maintained on prednisone and tacrolimus for immune suppression for her minimal change disease, and had previously received Rituximab but is not on it. QuantGOLD sent on 07/05/2022 was negative. She was also started on Bactrim for prophylaxis.  Patient lives with her grandfather. 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 Her grandfather has a dog that stays outside. She has no birds and no other exposures. Patient reports vaping marijuana products as well as nicotine products.  Pertinent  Medical History  -CKD -Minimal Change Disease -Type 1 Diabetes   Review  of Systems  Review of Systems  Constitutional:  Negative for chills, fever and weight loss.  Respiratory:  Positive for cough, sputum production and shortness of breath. Negative for hemoptysis and wheezing.   Cardiovascular:  Negative for chest pain.  Skin:  Negative for rash.  Neurological:  Positive for dizziness. Negative for weakness.     Objective   Blood pressure (!) 90/42, pulse (!) 103, temperature 97.8 F (36.6 C), temperature source Axillary, resp. rate (!) 26, height 4' 11"$  (1.499 m), weight 54.5 kg, SpO2 100 %.        Intake/Output Summary (Last 24 hours) at 08/22/2022 1317 Last data filed at 08/22/2022 [id number] Gross per 24 hour  Intake 1000 ml  Output --  Net 1000 ml   Filed Weights   08/22/22 0630 08/22/22 1243  Weight: 52.2 kg 54.5 kg    Examination: Physical Exam Constitutional:      General: She is not in acute distress.    Appearance: She is well-developed. She is not ill-appearing.  HENT:     Head: Normocephalic.     Mouth/Throat:     Mouth: Mucous membranes are moist.  Cardiovascular:     Rate and Rhythm: Normal rate and regular rhythm.     Heart sounds: Normal heart sounds.  Pulmonary:     Effort: Pulmonary effort is normal.     Breath sounds: Normal breath sounds.  Abdominal:     Palpations: Abdomen is soft.  Musculoskeletal:     Cervical back: Normal range of motion.  Neurological:     Mental Status: She is oriented to person,  place, and time.       Assessment & Plan:   #Cavitary Pulmonary Lesions  Chest CT on my review is notable for multiple foci of cavitary lesions in addition to mosaic attenuation with ground glass opacities. The differential includes infectious etiologies (bacterial, AFB, fungal, septic emboli) and non-infectious etiologies (infarct secondary to PE, vasculitis such as GPA or MPA, organizing pneumonia, langerhans cell histiocytosis). The mosaic attenuation is possible in settings of pulmonary embolism but is  non-specific and can be seen in infectious and inflammatory conditions. Other notable finding on my review of the CT is reticulation in the lower lobes, more so in the right lower lobe, that appears to be present on previous imaging (abdominal CT's performed in 2021 and 2023) albeit progressed.  Will initiate an infectious workup as well as a workup for auto-immune and vasculitic processes. She will require a high resolution chest CT and further investigations pending initial workup. This would be to rule out idiopathic interstitial pneumonias, vasculitis, langerhans cell histiocytosis, as well as infectious etiologies.  -respiratory and blood cultures -B-d-glucan, aspergillus galactomannan, cryptococcal antigen -urine histoplasma -ANA, ANCA -agree with CAP coverage for now  #Submassive Pulmonary Embolus  Intermediate high per ERS criteria with signs of RV strain (biochemical, on CT, and on ultrasonography). No report of hemodynamic symptoms per patient or family and hemodynamically stable in the [name]. Continue with anti-coagulation and consider catheter directed thrombolysis given intermediate high risk status. Would consider full dose thrombolysis with any sign of hemodynamic collapse/compromise.  -lifelong anti-coagulation recommended -consider catheter directed thrombolysis -TTE  Armando Reichert, MD [facility] 08/22/2022 2:03 PM    Labs   CBC: Recent Labs  Lab 08/22/22 0708  WBC 15.2*  NEUTROABS 9.2*  HGB 15.6*  HCT 47.4*  MCV 84.6  PLT [id number]    Basic Metabolic Panel: Recent Labs  Lab 08/22/22 0708  NA 132*  K 4.2  CL 102  CO2 23  GLUCOSE 224*  BUN 32*  CREATININE 1.32*  CALCIUM 7.9*   GFR: Estimated Creatinine Clearance: 50.3 mL/min (A) (by C-G formula based on SCr of 1.32 mg/dL (H)). Recent Labs  Lab 08/22/22 0708 08/22/22 0913 08/22/22 1115  PROCALCITON  --  0.73  --   WBC 15.2*  --   --   LATICACIDVEN  --   --  2.3*    Liver  Function Tests: Recent Labs  Lab 08/22/22 0708  AST 19  ALT 17  ALKPHOS 185*  BILITOT 0.3  PROT 5.6*  ALBUMIN <1.5*   No results for input(s): "LIPASE", "AMYLASE" in the last 168 hours. No results for input(s): "AMMONIA" in the last 168 hours.  ABG    Component Value Date/Time   PHART 7.10 (LL) 07/29/2019 0321   PCO2ART <19.0 (LL) 07/29/2019 0321   PO2ART 164 (H) 07/29/2019 0321   HCO3 21.3 09/26/2021 0912   ACIDBASEDEF 1.5 09/26/2021 0912   O2SAT 86.4 09/26/2021 0912     Coagulation Profile: Recent Labs  Lab 08/22/22 1115  INR 1.4*    Cardiac Enzymes: No results for input(s): "CKTOTAL", "CKMB", "CKMBINDEX", "TROPONINI" in the last 168 hours.  HbA1C: Hgb A1c MFr Bld  Date/Time Value Ref Range Status  09/04/2021 01:06 PM 13.1 (H) 4.8 - 5.6 % Final    Comment:    (NOTE) **Verified by repeat analysis**         Prediabetes: 5.7 - 6.4         Diabetes: >6.4  Glycemic control for adults with diabetes: <7.0   05/18/2021 07:18 AM 10.5 (H) 4.8 - 5.6 % Final    Comment:    (NOTE) Pre diabetes:          5.7%-6.4%  Diabetes:              >6.4%  Glycemic control for   <7.0% adults with diabetes     CBG: No results for input(s): "GLUCAP" in the last 168 hours.   Past Medical History:  She,  has a past medical history of Diabetes mellitus without complication ([place]), Dyslipidemia, Hypoalbuminemia, Marijuana abuse, Minimal change disease (08/23/2019), Nephrotic syndrome (08/23/2019), and Tobacco dependence.   Surgical History:  History reviewed. No pertinent surgical history.   Social History:   reports that she has been smoking e-cigarettes. She has never used smokeless tobacco. She reports that she does not currently use alcohol. She reports current drug use. Drug: Marijuana.   Family History:  Her family history is not on file.   Allergies No Known Allergies   Home Medications  Prior to Admission medications   Medication Sig Start Date End Date  Taking? Authorizing Provider  apixaban (ELIQUIS) 5 MG TABS tablet Take 5 mg by mouth 2 (two) times daily. 07/05/22  Yes [provider]  glucagon 1 MG injection Inject 1 mg into the muscle as needed. 02/26/19  Yes [provider]  insulin aspart (NOVOLOG) 100 UNIT/ML injection Inject 5-8 Units into the skin 3 (three) times daily before meals. 09/06/21 08/22/22 Yes Enzo Bi, MD  LANTUS 100 UNIT/ML injection Inject 0.3 mLs (30 Units total) into the skin at bedtime. 09/06/21 08/22/22 Yes Enzo Bi, MD  tacrolimus (PROGRAF) 1 MG capsule Take 2 mg by mouth 2 (two) times daily.   Yes [provider]  Vitamin D, Ergocalciferol, (DRISDOL) 1.25 MG (50000 UNIT) CAPS capsule Take 50,000 Units by mouth every 7 (seven) days. 07/10/22 07/10/23 Yes [provider]  acetaminophen (TYLENOL) 325 MG tablet Take 325 mg by mouth every 6 (six) hours as needed. Patient not taking: Reported on 09/26/2021 01/14/17   [provider]  atorvastatin (LIPITOR) 40 MG tablet Take 1 tablet (40 mg total) by mouth daily. Patient not taking: Reported on 08/22/2022 09/06/21 12/05/21  Enzo Bi, MD  blood glucose meter kit and supplies Dispense based on patient and insurance preference. Use up to four times daily as directed. (FOR ICD-10 E10.9, E11.9). 09/06/21   Enzo Bi, MD  sulfamethoxazole-trimethoprim (BACTRIM DS) 800-160 MG tablet Take 1 tablet by mouth 3 (three) times a week. Patient not taking: Reported on 08/22/2022 07/07/22   [provider]  torsemide (DEMADEX) 20 MG tablet Take 1 tablet (20 mg total) by mouth daily. 10/01/21 10/31/21  Val Riles, MD     Total care time: I spent 85 minutes caring for this patient today, including preparing to see the patient, obtaining a medical history , reviewing a separately obtained history, performing a medically appropriate examination and/or evaluation, counseling and educating the patient/family/caregiver, ordering medications, tests, or procedures,  referring and communicating with other health care professionals (not separately reported), documenting clinical information in the electronic health record, and independently interpreting results (not separately reported/billed) and communicating results to the patient/family/caregiver

## 2022-08-22 NOTE — ED Triage Notes (Signed)
Patient ambulatory to triage with steady gait, without difficulty or distress noted; pt reports SHOB and upper back pain x 2 days; denies any recent illness

## 2022-08-22 NOTE — ED Notes (Signed)
Unsuccessful IV attempt by this RN, staff assistance requested.   Pt placed on 2L [place] at this time for saturation of 90% RA. WCTM.

## 2022-08-22 NOTE — ED Notes (Signed)
Pt informed of need for urine sample, endorsing she is unable to provide at this time. Fluids infusing, will check again. WCTM.

## 2022-08-22 NOTE — Consult Note (Signed)
Pharmacy Antibiotic Note  [name] is a 22 y.o. female admitted on 08/22/2022 with pneumonia.  Pharmacy has been consulted for vancomycin and cefepime dosing.  Pt with Nephrotic syndrome.  Scr 1.32 (recent baseline 1.1-1.3)  Plan: Vancomycin 1000 mg IV x 1, followed by Vancomycin 750 mg IV Q 24 hrs. Goal AUC 400-550. Expected AUC: 472.4/Cmin 11.9, IBW/TBW, Vd 0.72 SCr used: 1.32   Cefepime 2 gm IV Q12H  Height: 4' 11"$  (149.9 cm) Weight: 52.2 kg (115 lb) IBW/kg (Calculated) : 43.2  Temp (24hrs), Avg:97.9 F (36.6 C), Min:97.9 F (36.6 C), Max:97.9 F (36.6 C)  Recent Labs  Lab 08/22/22 0708  WBC 15.2*  CREATININE 1.32*    Estimated Creatinine Clearance: 49.4 mL/min (A) (by C-G formula based on SCr of 1.32 mg/dL (H)).    No Known Allergies  Antimicrobials this admission: Vancomycin 2/20 >>  Cefepime 2/20 >>  Azithromycin 2/20 >>  Dose adjustments this admission: N/A  Microbiology results: 2/20 BCx: pending 2/20 MRSA PCR: ordered  Thank you for allowing pharmacy to be a part of this patient's care.  Alison Murray 08/22/2022 12:18 PM

## 2022-08-22 NOTE — ED Provider Notes (Addendum)
22 yo F here with chest pain, SOB. Plan to f/u CT. Reviewed pt's records, pt has complex history including nephrotic syndrome, Type 1 DM. Imaging obtained, reviewed, and is concerning for significant PE with right heart strain. Trop, BNP also positive c/w submassive PE. No hypotension and sats >90% on 2L [place]. Will admit to hospitalist service. Discussed with Dr. Abigail Butts of Nephrology who will see pt, as well as Vascular Surgery re: PE. Heparin started. Discussed with intensivist on call as well, who feels pt can be managed by hospitalist with pulm consult as needed. Of note, CT does show possible PNA and cavitary lung lesions and pt is on significant immunosuppressants.  Will cover empirically with broad-spectrum ABX with atypical coverage.   .Critical Care  Performed by: Duffy Bruce, MD Authorized by: Duffy Bruce, MD   Critical care provider statement:    Critical care time (minutes):  30   Critical care time was exclusive of:  Separately billable procedures and treating other patients   Critical care was necessary to treat or prevent imminent or life-threatening deterioration of the following conditions:  Cardiac failure, circulatory failure and respiratory failure   Critical care was time spent personally by me on the following activities:  Development of treatment plan with patient or surrogate, discussions with consultants, evaluation of patient's response to treatment, examination of patient, ordering and review of laboratory studies, ordering and review of radiographic studies, ordering and performing treatments and interventions, pulse oximetry, re-evaluation of patient's condition and review of old charts     Duffy Bruce, MD 08/22/22 [id number]    Duffy Bruce, MD 08/22/22 1006

## 2022-08-22 NOTE — Progress Notes (Signed)
  Evaluation after Contrast Extravasation  Patient seen and examined immediately after contrast extravasation while in CT.  Exam: There is mild swelling at the LUE area.  There is no erythema. There is mild discoloration. There are no blisters. There are no signs of decreased perfusion of the skin.  It is not warm to touch.  The patient has full ROM in fingers.  Radial pulse is normal.  Per contrast extravasation protocol, I have instructed the patient to keep an ice pack on the area for 20-60 minutes at a time for about 48 hours.   Keep arm elevated as much as possible.   The patient understands to call the radiology department if there is: - increase in pain or swelling - changed or altered sensation - ulceration or blistering - increasing redness - warmth or increasing firmness - decreased tissue perfusion as noted by decreased capillary refill or discoloration of skin - decreased pulses peripheral to site   Tyson Alias AGNP 08/22/2022 8:48 AM

## 2022-08-22 NOTE — ED Notes (Addendum)
Report given to [place], RN in ICU

## 2022-08-22 NOTE — Progress Notes (Signed)
Patient transported to special procedures by bed with this RN and orderly Roselyn Reef. Tye [place], RN at bedside.

## 2022-08-22 NOTE — Consult Note (Signed)
Hospital Consult    Reason for Consult:  Pulmonary Embolism Requesting Physician:  Dr Catherine Belling MD  MRN #:  [id number]  History of Present Illness: This is a 22 y.o. female past medical history of diabetes, nephrotic syndrome who presents with back pain cough and dyspnea.  She notes that she has had cough productive of green sputum for about a week.  For the last 2 days had upper back pain as well as dyspnea on exertion.  She denies lower extremity swelling or lower extremity pain.  Denies hemoptysis.  No fever or chills.  Has had some vomiting and diarrhea but denies abdominal pain.  History of DVT/PE. She was diagnosed with nephrotic syndrome at age 73. On/off prednisone, tacrolimus over the years. Relapsing/remitting. Not on a transplant list.   On exam today she is 100% oxygen saturation on 2 liters of nasal cannula. Heparin infusion ordered. She is post CT scan of chest for PE which was positive. Lungs on auscultation are congested with rales and rhonchi with cough on deep breath bilaterally. No other complaints and vitals are stable with heart rate at 100 .   Past Medical History:  Diagnosis Date   Diabetes mellitus without complication ([place])    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 No pertinent surgical history.  No Known Allergies  Prior to Admission medications   Medication Sig Start Date End Date Taking? Authorizing Provider  acetaminophen (TYLENOL) 325 MG tablet Take 325 mg by mouth every 6 (six) hours as needed. Patient not taking: Reported on 09/26/2021 01/14/17   [provider]  atorvastatin (LIPITOR) 40 MG tablet Take 1 tablet (40 mg total) by mouth daily. 09/06/21 12/05/21  Enzo Bi, MD  blood glucose meter kit and supplies Dispense based on patient and insurance preference. Use up to four times daily as directed. (FOR ICD-10 E10.9, E11.9). 09/06/21   Enzo Bi, MD  glucagon 1 MG injection Inject  1 mg into the muscle as needed. 02/26/19   [provider]  insulin aspart (NOVOLOG) 100 UNIT/ML injection Inject 5-8 Units into the skin 3 (three) times daily before meals. 09/06/21 12/05/21  Enzo Bi, MD  LANTUS 100 UNIT/ML injection Inject 0.3 mLs (30 Units total) into the skin at bedtime. 09/06/21 12/05/21  Enzo Bi, MD  ondansetron (ZOFRAN-ODT) 4 MG disintegrating tablet Take 1 tablet (4 mg total) by mouth every 8 (eight) hours as needed for nausea or vomiting. 09/26/21   [name], [place], MD  oxyCODONE-acetaminophen (PERCOCET) 5-325 MG tablet Take 1 tablet by mouth every 4 (four) hours as needed for severe pain. 03/29/22 03/29/23  Merlyn Lot, MD  tacrolimus (PROGRAF) 1 MG capsule Take 2 mg by mouth 2 (two) times daily.    [provider]  torsemide (DEMADEX) 20 MG tablet Take 1 tablet (20 mg total) by mouth daily. 10/01/21 10/31/21  Val Riles, MD    Social History   Socioeconomic History   Marital status: Single    Spouse name: Not on file   Number of children: Not on file   Years of education: Not on file   Highest education level: Not on file  Occupational History   Not on file  Tobacco Use   Smoking status: Never   Smokeless tobacco: Never  Vaping Use   Vaping Use: Never used  Substance and Sexual Activity   Alcohol use: Not Currently   Drug use: Not Currently   Sexual activity:  Not Currently  Other Topics Concern   Not on file  Social History Narrative   Not on file   Social Determinants of Health   Financial Resource Strain: Not on file  Food Insecurity: Not on file  Transportation Needs: Not on file  Physical Activity: Not on file  Stress: Not on file  Social Connections: Not on file  Intimate Partner Violence: Not on file     History reviewed. No pertinent family history.  ROS: Otherwise negative unless mentioned in HPI  Physical Examination  Vitals:   08/22/22 0800 08/22/22 0801  BP: (!) 85/55   Pulse: 99 100  Resp: (!) 23 (!) 28   Temp:    SpO2: 95% 95%   Body mass index is 23.23 kg/m.  General:  WDWN in NAD Gait: Not observed HENT: WNL, normocephalic Pulmonary: normal non-labored breathing, without Rales, rhonchi,  wheezing Cardiac: regular, Sinus Tachycardia, without  Murmurs, rubs or gallops; without carotid bruits Abdomen: Positive bowel sounds, soft, NT/ND, no masses Skin: without rashes Vascular Exam/Pulses: Palpable pulses in all extremities.  Extremities: without ischemic changes, without Gangrene , without cellulitis; without open wounds;  Musculoskeletal: no muscle wasting or atrophy  Neurologic: A&O X 3;  No focal weakness or paresthesias are detected; speech is fluent/normal Psychiatric:  The pt has Normal affect. Lymph:  Unremarkable  CBC    Component Value Date/Time   WBC 15.2 (H) 08/22/2022 0708   RBC 5.60 (H) 08/22/2022 0708   HGB 15.6 (H) 08/22/2022 0708   HCT 47.4 (H) 08/22/2022 0708   PLT 300 08/22/2022 0708   MCV 84.6 08/22/2022 0708   MCH 27.9 08/22/2022 0708   MCHC 32.9 08/22/2022 0708   RDW 14.7 08/22/2022 0708   LYMPHSABS 4.7 (H) 08/22/2022 0708   MONOABS 1.0 08/22/2022 0708   EOSABS 0.2 08/22/2022 0708   BASOSABS 0.1 08/22/2022 0708    BMET    Component Value Date/Time   NA 132 (L) 08/22/2022 0708   K 4.2 08/22/2022 0708   CL 102 08/22/2022 0708   CO2 23 08/22/2022 0708   GLUCOSE 224 (H) 08/22/2022 0708   BUN 32 (H) 08/22/2022 0708   CREATININE 1.32 (H) 08/22/2022 0708   CALCIUM 7.9 (L) 08/22/2022 0708   GFRNONAA 59 (L) 08/22/2022 0708   GFRAA >60 10/07/2019 1509    COAGS: Lab Results  Component Value Date   INR 1.2 09/26/2021     Non-Invasive Vascular Imaging:   EXAM: CT ANGIOGRAPHY CHEST WITH CONTRAST  IMPRESSION: 1. Acute bilateral pulmonary emboli with CT evidence of right heart strain (RV/LV Ratio = 1.9 ) consistent with at least submassive (intermediate risk) PE. The presence of right heart strain has been associated with an increased risk of  morbidity and mortality. Please refer to the "Code PE Focused" order set in EPIC. 2. Multifocal bilateral upper lobe predominant pulmonary nodules/masses many of which are cavitary. Findings are favored to reflect an atypical infectious process such as septic emboli. 3. Multifocal ground-glass opacities with subpleural sparing likely reflects a combination of pulmonary hemorrhage and/or an infectious or inflammatory etiology. 4. Hyperdensity vs focus of hyperenhancement in the right upper pole kidney, nonspecific suggest further evaluation with CT of the abdomen and pelvis upon stabilization of patient.  Statin:  Yes.   Beta Blocker:  No. Aspirin:  No. ACEI:  No. ARB:  No. CCB use:  No Other antiplatelets/anticoagulants:  No.    ASSESSMENT/PLAN: This is a 22 y.o. female past medical history of diabetes, nephrotic syndrome who  presents with back pain cough and dyspnea.  She notes that she has had cough productive of green sputum for about a week.  For the last 2 days had upper back pain as well as dyspnea on exertion. On CT scan of her chest she is noted to have bilateral Pulmonary embolisms  with right heart strain.   PLAN: Vascular surgery will plan on taking the patient to the vascular lab for a pulmonary thrombectomy this afternoon.  Had a long discussion with the patient and her mother at the bedside in the emergency department.  I discussed in detail the procedure, benefits, risks, complications.  I discussed in detail the possible need for temporary dialysis or even permanent dialysis due to her long history of nephrotic syndrome since that time she was 22 years old.  Her BUN today is 32 and her creatinine is 1.32 with a GFR estimated to be less than 60.  I answered both the patient and her mother's questions this morning.  They both verbalized her understanding of the procedure and the risks.  They would both like to proceed as soon as possible.  States she has not had anything to eat  or drink since dinnertime last night.  She will be made n.p.o. and taken to the vascular lab later today.   -Plan was discussed with Dr. Leotis Pain MD and he is in agreement with the plan   Drema Pry [facility] 08/22/2022 9:44 AM

## 2022-08-22 NOTE — ED Notes (Signed)
Unsuccessful blood draw attempt by Ermalinda Barrios, RN. Lab called for blood draw, they state they only have one tech and "dont know how long it will be". Awaiting IV team as well.

## 2022-08-22 NOTE — ED Notes (Signed)
IV team to bedside at this time

## 2022-08-23 ENCOUNTER — Inpatient Hospital Stay: Payer: Medicaid Other

## 2022-08-23 ENCOUNTER — Other Ambulatory Visit (HOSPITAL_COMMUNITY): Payer: Self-pay

## 2022-08-23 ENCOUNTER — Encounter: Payer: Self-pay | Admitting: Vascular Surgery

## 2022-08-23 DIAGNOSIS — I2609 Other pulmonary embolism with acute cor pulmonale: Secondary | ICD-10-CM | POA: Diagnosis not present

## 2022-08-23 LAB — HEPARIN LEVEL (UNFRACTIONATED): Heparin Unfractionated: 0.2 IU/mL — ABNORMAL LOW (ref 0.30–0.70)

## 2022-08-23 LAB — COMPREHENSIVE METABOLIC PANEL
ALT: 12 U/L (ref 0–44)
AST: 17 U/L (ref 15–41)
Albumin: <1.5 g/dL — ABNORMAL LOW (ref 3.5–5.0)
Alkaline Phosphatase: 117 U/L (ref 38–126)
Anion gap: 3 — ABNORMAL LOW (ref 5–15)
BUN: 35 mg/dL — ABNORMAL HIGH (ref 6–20)
CO2: 20 mmol/L — ABNORMAL LOW (ref 22–32)
Calcium: 7.2 mg/dL — ABNORMAL LOW (ref 8.9–10.3)
Chloride: 105 mmol/L (ref 98–111)
Creatinine, Ser: 1.43 mg/dL — ABNORMAL HIGH (ref 0.44–1.00)
GFR, Estimated: 53 mL/min — ABNORMAL LOW (ref >60)
Glucose, Bld: 294 mg/dL — ABNORMAL HIGH (ref 70–99)
Potassium: 5.2 mmol/L — ABNORMAL HIGH (ref 3.5–5.1)
Sodium: 128 mmol/L — ABNORMAL LOW (ref 135–145)
Total Bilirubin: 0.4 mg/dL (ref 0.3–1.2)
Total Protein: 4 g/dL — ABNORMAL LOW (ref 6.5–8.1)

## 2022-08-23 LAB — GLUCOSE, CAPILLARY
Glucose-Capillary: 293 mg/dL — ABNORMAL HIGH (ref 70–99)
Glucose-Capillary: 182 mg/dL — ABNORMAL HIGH (ref 70–99)
Glucose-Capillary: 166 mg/dL — ABNORMAL HIGH (ref 70–99)
Glucose-Capillary: 415 mg/dL — ABNORMAL HIGH (ref 70–99)

## 2022-08-23 LAB — PHOSPHORUS: Phosphorus: 4.3 mg/dL (ref 2.5–4.6)

## 2022-08-23 LAB — CBC
HCT: 29 % — ABNORMAL LOW (ref 36.0–46.0)
Hemoglobin: 9.6 g/dL — ABNORMAL LOW (ref 12.0–15.0)
MCH: 28 pg (ref 26.0–34.0)
MCHC: 33.1 g/dL (ref 30.0–36.0)
MCV: 84.5 fL (ref 80.0–100.0)
Platelets: 293 10*3/uL (ref 150–400)
RBC: 3.43 MIL/uL — ABNORMAL LOW (ref 3.87–5.11)
RDW: 14.6 % (ref 11.5–15.5)
WBC: 26.1 10*3/uL — ABNORMAL HIGH (ref 4.0–10.5)
nRBC: 0 % (ref 0.0–0.2)

## 2022-08-23 LAB — HEPATITIS B SURFACE ANTIGEN: Hepatitis B Surface Ag: NONREACTIVE

## 2022-08-23 LAB — HIV ANTIBODY (ROUTINE TESTING W REFLEX): HIV Screen 4th Generation wRfx: NONREACTIVE

## 2022-08-23 LAB — HEPATITIS B CORE ANTIBODY, IGM: Hep B C IgM: NONREACTIVE

## 2022-08-23 LAB — GLUCOSE, RANDOM: Glucose, Bld: 361 mg/dL — ABNORMAL HIGH (ref 70–99)

## 2022-08-23 LAB — HEPATITIS B CORE ANTIBODY, TOTAL: Hep B Core Total Ab: NONREACTIVE

## 2022-08-23 LAB — MAGNESIUM: Magnesium: 1.8 mg/dL (ref 1.7–2.4)

## 2022-08-23 MED ORDER — GLUCERNA SHAKE PO LIQD
237.0000 mL | Freq: Two times a day (BID) | ORAL | Status: DC
  Administered 2022-08-23 – 2022-08-24 (×3): 237 mL via ORAL

## 2022-08-23 MED ORDER — ADULT MULTIVITAMIN W/MINERALS CH
1.0000 | ORAL_TABLET | Freq: Every day | ORAL | Status: DC
  Administered 2022-08-24 – 2022-08-25 (×2): 1 via ORAL
  Filled 2022-08-23 (×2): qty 1

## 2022-08-23 MED ORDER — HEPARIN BOLUS VIA INFUSION
800.0000 [IU] | Freq: Once | INTRAVENOUS | Status: AC
  Administered 2022-08-23: 800 [IU] via INTRAVENOUS
  Filled 2022-08-23: qty 800

## 2022-08-23 MED ORDER — APIXABAN 5 MG PO TABS: 5.0000 mg | ORAL_TABLET | Freq: Two times a day (BID) | ORAL | Status: DC

## 2022-08-23 MED ORDER — INSULIN ASPART 100 UNIT/ML IJ SOLN
3.0000 [IU] | Freq: Three times a day (TID) | INTRAMUSCULAR | Status: DC
  Administered 2022-08-24 – 2022-08-25 (×3): 3 [IU] via SUBCUTANEOUS
  Filled 2022-08-23 (×3): qty 1

## 2022-08-23 MED ORDER — APIXABAN 5 MG PO TABS
10.0000 mg | ORAL_TABLET | Freq: Two times a day (BID) | ORAL | Status: DC
  Administered 2022-08-23 – 2022-08-25 (×5): 10 mg via ORAL
  Filled 2022-08-23 (×6): qty 2

## 2022-08-23 NOTE — Consult Note (Addendum)
[place] for apixaban Indication: pulmonary embolus  No Known Allergies  Patient Measurements: Height: 4' 11"$  (149.9 cm) Weight: 54.5 kg (120 lb 2.4 oz) IBW/kg (Calculated) : 43.2  Vital Signs: Temp: 97.7 F (36.5 C) (02/21 0800) Temp Source: Oral (02/21 0400) BP: 94/64 (02/21 0900) Pulse Rate: 93 (02/21 0900)  Labs: Recent Labs    08/22/22 0708 08/22/22 0913 08/22/22 1115 08/23/22 0621  HGB 15.6*  --   --  9.6*  HCT 47.4*  --   --  29.0*  PLT 300  --   --  293  APTT  --   --  >200*  --   LABPROT  --   --  16.9*  --   INR  --   --  1.4*  --   HEPARINUNFRC  --   --   --  0.20*  CREATININE 1.32*  --   --  1.43*  TROPONINIHS 50* 37*  --   --     Estimated Creatinine Clearance: 46.5 mL/min (A) (by C-G formula based on SCr of 1.43 mg/dL (H)).   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 Spoke with patient's mother, apixaban was stopped and patient has not taken in weeks.  Baseline Labs: aPTT - unknown / drawn after heparin started; INR - 1.4 Hgb - 15.6; Plts - 300  Assessment: 22 yo F presents with CC SOB/chest pain. CT reveals bilateral submassive PE. Patient was initiated on heparin and pharmacy has now been consulted to manage transition to apixaban following catheter-directed thrombolysis on 2/20. Notably, patient was discharged from [facility] on 07/05/2022 on apixaban for DVT ppx ISO severe hypoalbuminemia but patient is not taking currently per discussion with her mother. Patient's apixaban copay is $4.  Plan: Discontinue heparin Initiate apixaban 10 mg twice daily for 7 days (2/21-2/27) Starting morning of 2/28, transition to apixaban 5 mg twice daily Monitor CBC at least  every 7 days for inpatients on DOAC Will order CBC for 2/22 given Hgb 15.6 >> 9.6 following procedure  Will M. Ouida Sills, PharmD PGY-1 Pharmacy Resident 08/23/2022 10:22 AM

## 2022-08-23 NOTE — Progress Notes (Addendum)
Initial Nutrition Assessment  DOCUMENTATION CODES:   Not applicable  INTERVENTION:   Glucerna Shake po BID, each supplement provides 220 kcal and 10 grams of protein  MVI po daily   Pt at high refeed risk; recommend monitor potassium, magnesium and phosphorus labs daily until stable  Daily weights   NUTRITION DIAGNOSIS:   Inadequate oral intake related to acute illness as evidenced by meal completion < 25%.  GOAL:   Patient will meet greater than or equal to 90% of their needs  MONITOR:   PO intake, Supplement acceptance, Labs, Weight trends, I & O's, Skin  REASON FOR ASSESSMENT:   Consult Assessment of nutrition requirement/status  ASSESSMENT:   22 y/o female with h/o type I DM (since age 52y), nephrotic syndrome (since age 55y), marijuana use, HLD and HTN who is admitted with cavity lung masses (likely infectious) and   symptomatic bilateral pulmonary emboli s/p mechanical thrombectomy 2/20.  Met with pt and family in room today. Pt reports good appetite and oral intake at baseline and in hospital; however, pt's breakfast tray is sitting on her side table with only bites taken from her grits. RD discussed with pt the importance of adequate nutrition needed to preserve lean muscle. Pt is willing to drink vanilla supplements in hospital. RD will add supplements and MVI. Pt is at refeed risk.   Per chart, pt appears to be down 11lbs(8%) over the past year; this is not significant.   Medications reviewed and include: colace, insulin, solu-medrol, nicotine, bactrim, azithromycin, cefepime, vancomycin   Labs reviewed: Na 128(L), K 5.2(H), BUN 35(H), creat 1.43(H), P 4.3 wnl, Mg 1.8 wnl, alb <1.5(L) BNP- 606.9(H)- 2/20 Wbc- 26.1(H), Hgb 9.6(L), Hct 29.0(L) Cbgs- 293, 360, 313, 311 x 24 hrs  AIC 13.1(H)- 3/23  NUTRITION - FOCUSED PHYSICAL EXAM:  Flowsheet Row Most Recent Value  Orbital Region No depletion  Upper Arm Region No depletion  Thoracic and Lumbar Region No  depletion  Buccal Region No depletion  Temple Region No depletion  Clavicle Bone Region No depletion  Clavicle and Acromion Bone Region No depletion  Scapular Bone Region No depletion  Dorsal Hand No depletion  Patellar Region No depletion  Anterior Thigh Region No depletion  Posterior Calf Region No depletion  Edema (RD Assessment) None  Hair Reviewed  Eyes Reviewed  Mouth Reviewed  Skin Reviewed  Nails Reviewed   Diet Order:   Diet Order             Diet Carb Modified Fluid consistency: Thin; Room service appropriate? Yes  Diet effective now                  EDUCATION NEEDS:   Education needs have been addressed  Skin:  Skin Assessment: Reviewed RN Assessment  Last BM:  pta  Height:   Ht Readings from Last 1 Encounters:  08/22/22 4' 11"$  (1.499 m)    Weight:   Wt Readings from Last 1 Encounters:  08/22/22 54.5 kg    Ideal Body Weight:  44.5 kg  BMI:  Body mass index is 24.27 kg/m.  Estimated Nutritional Needs:   Kcal:  1600-1800kcal/day  Protein:  55-65g/day  Fluid:  1.6-1.8L/day  Koleen Distance MS, RD, LDN Please refer to [facility] for RD and/or RD on-call/weekend/after hours pager

## 2022-08-23 NOTE — Progress Notes (Signed)
Inpatient Diabetes Program Recommendations  AACE/ADA: New Consensus Statement on Inpatient Glycemic Control (2015)  Target Ranges:  Prepandial:   less than 140 mg/dL      Peak postprandial:   less than 180 mg/dL (1-2 hours)      Critically ill patients:  140 - 180 mg/dL   Lab Results  Component Value Date   GLUCAP 293 (H) 08/23/2022   HGBA1C 13.1 (H) 09/04/2021    Review of Glycemic Control  Latest Reference Range & Units 08/22/22 12:31 08/22/22 14:38 08/22/22 21:37 08/23/22 07:45  Glucose-Capillary 70 - 99 mg/dL 311 (H) 313 (H) 360 (H) 293 (H)   Diabetes history: DM 1 Outpatient Diabetes medications:  Novolog 5-8 units tid with meals Lantus 20 units q HS Current orders for Inpatient glycemic control:  Semglee 20 units q HS Novolog 0-9 units tid with meals and HS Solumedrol 125 mg daily (last dose tomorrow)  Inpatient Diabetes Program Recommendations:    Consider adding Novolog meal coverage 3 units tid with meals (hold if patient eats less than 50% or NPO).  Also consider slight increase of Semglee to 24 units q HS. Last A1C was 9.3% when she saw Endocrinology in January of 2024.   Thanks,  Adah Perl, RN, BC-ADM Inpatient Diabetes Coordinator Pager [phone]

## 2022-08-23 NOTE — TOC Benefit Eligibility Note (Signed)
Patient Teacher, English as a foreign language completed.    The patient is currently admitted and upon discharge could be taking Eliquis 5 mg.  The current 30 day co-pay is $4.00.   The patient is currently admitted and upon discharge could be taking Xarelto 20 mg.  The current 30 day co-pay is $4.00.   The patient is insured through [employer] [place] [employer]   Lyndel Safe, [place] Patient Advocate Specialist [place] Patient Advocate Team Direct Number: [phone]  Fax: [phone]

## 2022-08-23 NOTE — Progress Notes (Signed)
PROGRESS NOTE    Catherine [name]  [id number] DOB: 2001/06/04 DOA: 08/22/2022 PCP: [facility] [place]  118A/118A-AA  LOS: 1 day   Brief hospital course:   Assessment & Plan: Catherine [name] is a 22 y.o. female with medical history significant of DM and nephrotic/minimal change syndrome presenting with SOB.   She reports that she developed SOB starting earlier this week, present with movement.  She was coughing up green phlegm that started at the same time as the SOB.  Some wheezing.  No fever.  No sick contacts.  No leg pain/swelling.  Remote h/o collapsed lung, required chest tube and [facility] Flight admission to [facility]; no other respiratory history.   Sje was diagnosed with nephrotic syndrome at age 11.  On/off prednisone, tacrolimus over the years. Relapsing/remitting.  Not on a transplant list.   Pulmonary embolism with R heart strain S/p s/p pulmonary thrombectomy on 2/20 -Patient without prior episodes of thromboembolic disease presenting with new submassive PE -She was hospitalized at [facility] in January and was discharged on Eliquis due her significantly hypoalbuminemia; she ran out of the Eliquis and did not obtain refills --started on heparin gtt -Smoking cessation has been strongly encouraged by admitting physician- both tobacco and marijuana -Based on her severely low albumin she is at high risk (>8%/year) for recurrent VTE and should be maintained on indefinite AC. -The patient understands that thromboembolic disease can be catastrophic and even deadly and that she must be complaint with physician appointments and anticoagulation. 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  Procal elevated. -started on Cefepime, Azithromycin and Vanc on admission. --pulm consulted. 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  -Renal function appears to be stable at this time after her recent flare; she was hospitalized at [facility] from 12/29-1/3 and treated with high-dose prednisone (supposed to be continued for at least 4 weeks) and tacrolimus -Intermittent takes tacrolimus and prednisone.  Prescribed Bactrim 3x/week but not taking. --nephrology consulted.  Started on stress dose steroid, resumed Bactrim. Plan: --cont MIVF until this evening, per nephrology --cont Bactrim for PJP ppx --cont IV solumedrol 125 mg daily for 3 days --Will need ARB for renal preservation once blood pressure improved.  -Also with apparent hyperdensity focus of the kidney on imaging; likely needs a dedicated CT A/P once more stable   Uncontrolled DM1 -Poorly controlled, most recent A1c was 9.6 on 12/29 --cont glargine 20u nightly --add mealtime 3u TID    HLD -Continue atorvastatin   Tobacco abuse -Cessation encouraged   Marijuana abuse -Cessation encouraged; this should be encouraged on an ongoing basis   DVT prophylaxis: [id number] Code Status: Full code  Family Communication: parents updated at bedside today Level of care: Med-Surg Dispo:   The patient is from: home Anticipated d/c is to: home Anticipated d/c date is: 1-2 days   Subjective and Interval History:  This morning pt reported some cough with no sputum production.  Denied swelling.   Objective: Vitals:   08/23/22 1100 08/23/22 1200 08/23/22 1300 08/23/22 1348  BP: 98/72 100/73 101/73 (!) 89/60  Pulse: 91 92 93 99  Resp: 20 (!) 24 (!) 27 20  Temp:  98.1 F (36.7 C)  98.6 F (37 C)  TempSrc:    Oral  SpO2: 95% 96%  98% 98%  Weight:      Height:        Intake/Output Summary (Last 24 hours) at 08/23/2022 1553 Last data filed at 08/23/2022 0800 Gross per 24 hour  Intake 633.35 ml  Output 850 ml  Net -216.65 ml   Filed Weights    08/22/22 0630 08/22/22 1243  Weight: 52.2 kg 54.5 kg    Examination:   Constitutional: NAD, oriented, sleepy HEENT: conjunctivae and lids normal, EOMI CV: No cyanosis.   RESP: normal respiratory effort, on RA Extremities: No effusions, edema in BLE SKIN: warm, dry   Data Reviewed: I have personally reviewed labs and imaging studies  Time spent: 50 minutes  Enzo Bi, MD [facility] If 7PM-7AM, please contact night-coverage 08/23/2022, 3:53 PM

## 2022-08-23 NOTE — Progress Notes (Signed)
  Progress Note    08/23/2022 8:26 AM 1 Day Post-Op  Subjective:  This is a 22 y.o. female past medical history of diabetes, nephrotic syndrome who presents with back pain cough and dyspnea.  She notes that she has had cough productive of green sputum for about a week.  For the last 2 days had upper back pain as well as dyspnea on exertion.  She denies lower extremity swelling or lower extremity pain.  Denies hemoptysis.  No fever or chills.  Has had some vomiting and diarrhea but denies abdominal pain.  History of DVT/PE. She was diagnosed with nephrotic syndrome at age 64. On/off prednisone, tacrolimus over the years. Relapsing/remitting. Not on a transplant list.    On exam today she is 100% oxygen saturation on 2 liters of nasal cannula. Heparin infusion running. Lungs on auscultation are congested with rales and rhonchi with cough on deep breath bilaterally. No other complaints and vitals are stable with heart rate in the 90's. Ultrasound studies being done at the bed side this morning to evaluate for DVT's of her lower extremities. Denies any pain this morning. No other complaints overnight. Vitals all remain stable.   Vitals:   08/23/22 0700 08/23/22 0800  BP: 91/61   Pulse: 84   Resp: (!) 21 (!) 27  Temp:  97.7 F (36.5 C)  SpO2: 100%    Physical Exam: Cardiac:  RRR Lungs:  normal non-labored breathing, with Rales, rhonchi  Incisions:  Right groin with dressing and C/D/I  Extremities:  Right lower extremity with palpable PT pulse and warm to touch Abdomen:  Positive bowel sounds, soft, flat non tender and non distended Neurologic: AAOX3 and Neuro intact. Answers all questions appropriately.   CBC    Component Value Date/Time   WBC 26.1 (H) 08/23/2022 0621   RBC 3.43 (L) 08/23/2022 0621   HGB 9.6 (L) 08/23/2022 0621   HCT 29.0 (L) 08/23/2022 0621   PLT 293 08/23/2022 0621   MCV 84.5 08/23/2022 0621   MCH 28.0 08/23/2022 0621   MCHC 33.1 08/23/2022 0621   RDW 14.6  08/23/2022 0621   LYMPHSABS 4.7 (H) 08/22/2022 0708   MONOABS 1.0 08/22/2022 0708   EOSABS 0.2 08/22/2022 0708   BASOSABS 0.1 08/22/2022 0708    BMET    Component Value Date/Time   NA 128 (L) 08/23/2022 0621   K 5.2 (H) 08/23/2022 0621   CL 105 08/23/2022 0621   CO2 20 (L) 08/23/2022 0621   GLUCOSE 294 (H) 08/23/2022 0621   BUN 35 (H) 08/23/2022 0621   CREATININE 1.43 (H) 08/23/2022 0621   CALCIUM 7.2 (L) 08/23/2022 0621   GFRNONAA 53 (L) 08/23/2022 0621   GFRAA >60 10/07/2019 1509    INR    Component Value Date/Time   INR 1.4 (H) 08/22/2022 1115     Intake/Output Summary (Last 24 hours) at 08/23/2022 0826 Last data filed at 08/23/2022 0800 Gross per 24 hour  Intake 1533.35 ml  Output 850 ml  Net 683.35 ml     Assessment/Plan:  22 y.o. female is s/p pulmonary thrombectomy for bilateral pulmonary embolisms. 1 Day Post-Op  PLAN: Convert Heparin infusion to Eliquis 5 mg BID Pain control Wean off oxygen as tolerated.  Follow up with Vascular Surgery in 1 Month No studies   DVT prophylaxis:  Heparin Infusion   Drema Pry [facility] 08/23/2022 8:26 AM

## 2022-08-23 NOTE — Progress Notes (Addendum)
0730 Complains of left upper arm pain from IV. IV discontinued and ice applied per patient request. 0800 Patient ask for more oxygen. Still upset about IV site stinging. Request denied. Pulse oximeter states she is 100% on 2 liters. Encouraged to deep breathe and calm down. [id number] Ultrasound Tech. in for doppler studies of bilateral legs. Patient requested that they come back later. Request denied- no reason to delay. Nurse stayed with patient until her mother arrived. Patient is very immature and does not want to do anything for herself. 2 Weaned off of oxygen slowly without the patients knowledge. 1200 Refused to eat lunch or drink her Glucerna.Slept all morning. 1300 Report called to 1C  Amandeep RN. 1320 Transferred to room 118 via bed.

## 2022-08-23 NOTE — Consult Note (Signed)
ANTICOAGULATION CONSULT NOTE  Pharmacy Consult for heparin Indication: pulmonary embolus  No Known Allergies  Patient Measurements: Height: 4' 11"$  (149.9 cm) Weight: 54.5 kg (120 lb 2.4 oz) IBW/kg (Calculated) : 43.2 Heparin Dosing Weight: 52.2 kg  Vital Signs: Temp: 98.1 F (36.7 C) (02/21 0400) Temp Source: Oral (02/21 0400) BP: 91/61 (02/21 0500) Pulse Rate: 84 (02/21 0500)  Labs: Recent Labs    08/22/22 0708 08/22/22 0913 08/22/22 1115 08/23/22 0621  HGB 15.6*  --   --  9.6*  HCT 47.4*  --   --  29.0*  PLT 300  --   --  293  APTT  --   --  >200*  --   LABPROT  --   --  16.9*  --   INR  --   --  1.4*  --   HEPARINUNFRC  --   --   --  0.20*  CREATININE 1.32*  --   --  1.43*  TROPONINIHS 50* 37*  --   --      Estimated Creatinine Clearance: 46.5 mL/min (A) (by C-G formula based on SCr of 1.43 mg/dL (H)).   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  Spoke with patient's mother, apixaban was stopped and patient has not taken in weeks.  Assessment: Pharmacy has been consulted to dose heparin in this 22 yo F who presents to the ED with CC of SOB/Chest pain.  CT reveals submassive PE.    CrCl 49.4  Baseline labs: Hgb 15.6, Hct 47.4, aPTT/INR ordered. Goal of Therapy:  Heparin level 0.3-0.7 units/ml Monitor platelets by anticoagulation protocol: Yes   Plan:  Bolus 800 units x 1 Increase heparin infusion to 1050 units/hr Overnight, pt refusing frequent labs draws so will schedule to recheck HL in 8 hours after rate change CBC daily while on heparin  Renda Rolls, PharmD, [facility] 08/23/2022 6:55 AM

## 2022-08-23 NOTE — Progress Notes (Signed)
[facility]  ROUNDING NOTE   Subjective:   [name] was admitted to [facility] on 08/22/2022 for SOB (shortness of breath) [R06.02] Pulmonary embolism with acute cor pulmonale ([place]) [I26.09]  Patient presents with her mother who assists with history taking. Patient started a new job yesterday. Patient presents with shortness of breath. She was found to have acute pulmonary embolism. Seems that she ran out of her apixaban.   Patient was admitted to [facility] from 12/29 to 1/3 with acute flare of minimal change disease with acute kidney injury. She was discharged on prednisone and tacrolimus.   Patient seen and evaluated at bedside in ICU Resting comfortably Mother at bedside Denies shortness of breath, room air Denies pain or discomfort  Objective:  Vital signs in last 24 hours:  Temp:  [97.7 F (36.5 C)-98.2 F (36.8 C)] 97.7 F (36.5 C) (02/21 0800) Pulse Rate:  [0-107] 89 (02/21 1000) Resp:  [19-35] 26 (02/21 1000) BP: (80-124)/(42-97) 88/64 (02/21 1000) SpO2:  [79 %-100 %] 97 % (02/21 1000) Weight:  [54.5 kg] 54.5 kg (02/20 1243)  Weight change: 2.336 kg Filed Weights   08/22/22 0630 08/22/22 1243  Weight: 52.2 kg 54.5 kg    Intake/Output: I/O last 3 completed shifts: In: 1533.4 [I.V.:433.5; IV Piggyback:1099.9] Out: 650 [Urine:300; Blood:350]   Intake/Output this shift:  Total I/O In: -  Out: 200 [Urine:200]  Physical Exam: General: NAD, ill appearing  Head: Normocephalic, atraumatic. Moist oral mucosal membranes  Eyes: Anicteric  Lungs:  Clear to auscultation  Heart: Regular rate and rhythm  Abdomen:  Soft, nontender,   Extremities:  no peripheral edema.  Neurologic: Nonfocal, moving all four extremities  Skin: No lesions        Basic Metabolic Panel: Recent Labs  Lab 08/22/22 0708 08/23/22 0621  NA 132* 128*  K 4.2 5.2*  CL 102 105  CO2 23 20*  GLUCOSE 224* 294*  BUN 32* 35*  CREATININE 1.32* 1.43*  CALCIUM 7.9* 7.2*  MG   --  1.8  PHOS  --  4.3     Liver Function Tests: Recent Labs  Lab 08/22/22 0708 08/23/22 0621  AST 19 17  ALT 17 12  ALKPHOS 185* 117  BILITOT 0.3 0.4  PROT 5.6* 4.0*  ALBUMIN <1.5* <1.5*    No results for input(s): "LIPASE", "AMYLASE" in the last 168 hours. No results for input(s): "AMMONIA" in the last 168 hours.  CBC: Recent Labs  Lab 08/22/22 0708 08/23/22 0621  WBC 15.2* 26.1*  NEUTROABS 9.2*  --   HGB 15.6* 9.6*  HCT 47.4* 29.0*  MCV 84.6 84.5  PLT 300 293     Cardiac Enzymes: No results for input(s): "CKTOTAL", "CKMB", "CKMBINDEX", "TROPONINI" in the last 168 hours.  BNP: Invalid input(s): "POCBNP"  CBG: Recent Labs  Lab 08/22/22 1231 08/22/22 1438 08/22/22 2137 08/23/22 0745 08/23/22 1137  GLUCAP 311* 313* 360* 293* 182*    Microbiology: Results for orders placed or performed during the hospital encounter of 08/22/22  Resp panel by RT-PCR (RSV, Flu A&B, Covid) Anterior Nasal Swab     Status: None   Collection Time: 08/22/22  7:08 AM   Specimen: Anterior Nasal Swab  Result Value Ref Range Status   SARS Coronavirus 2 by RT PCR NEGATIVE NEGATIVE Final    Comment: (NOTE) SARS-CoV-2 target nucleic acids are NOT DETECTED.  The SARS-CoV-2 RNA is generally detectable in upper respiratory specimens during the acute phase of infection. The lowest concentration of SARS-CoV-2  viral copies this assay can detect is 138 copies/mL. A negative result does not preclude SARS-Cov-2 infection and should not be used as the sole basis for treatment or other patient management decisions. A negative result may occur with  improper specimen collection/handling, submission of specimen other than nasopharyngeal swab, presence of viral mutation(s) within the areas targeted by this assay, and inadequate number of viral copies(<138 copies/mL). A negative result must be combined with clinical observations, patient history, and epidemiological information. The expected  result is Negative.  Fact Sheet for Patients:  EntrepreneurPulse.com.au  Fact Sheet for Healthcare Providers:  IncredibleEmployment.be  This test is no t yet approved or cleared by the [place] FDA and  has been authorized for detection and/or diagnosis of SARS-CoV-2 by FDA under an Emergency Use Authorization (EUA). This EUA will remain  in effect (meaning this test can be used) for the duration of the COVID-19 declaration under Section 564(b)(1) of the Act, 21 U.S.C.section 360bbb-3(b)(1), unless the authorization is terminated  or revoked sooner.       Influenza A by PCR NEGATIVE NEGATIVE Final   Influenza B by PCR NEGATIVE NEGATIVE Final    Comment: (NOTE) The Xpert Xpress SARS-CoV-2/FLU/RSV plus assay is intended as an aid in the diagnosis of influenza from Nasopharyngeal swab specimens and should not be used as a sole basis for treatment. Nasal washings and aspirates are unacceptable for Xpert Xpress SARS-CoV-2/FLU/RSV testing.  Fact Sheet for Patients: EntrepreneurPulse.com.au  Fact Sheet for Healthcare Providers: IncredibleEmployment.be  This test is not yet approved or cleared by the [place] FDA and has been authorized for detection and/or diagnosis of SARS-CoV-2 by FDA under an Emergency Use Authorization (EUA). This EUA will remain in effect (meaning this test can be used) for the duration of the COVID-19 declaration under Section 564(b)(1) of the Act, 21 U.S.C. section 360bbb-3(b)(1), unless the authorization is terminated or revoked.     Resp Syncytial Virus by PCR NEGATIVE NEGATIVE Final    Comment: (NOTE) Fact Sheet for Patients: EntrepreneurPulse.com.au  Fact Sheet for Healthcare Providers: IncredibleEmployment.be  This test is not yet approved or cleared by the [place] FDA and has been authorized for detection and/or diagnosis of  SARS-CoV-2 by FDA under an Emergency Use Authorization (EUA). This EUA will remain in effect (meaning this test can be used) for the duration of the COVID-19 declaration under Section 564(b)(1) of the Act, 21 U.S.C. section 360bbb-3(b)(1), unless the authorization is terminated or revoked.  Performed at [facility], [address] [place], [place] [address]   MRSA Next Gen by PCR, Nasal     Status: None   Collection Time: 08/22/22 12:47 PM   Specimen: Nasal Mucosa; Nasal Swab  Result Value Ref Range Status   MRSA by PCR Next Gen NOT DETECTED NOT DETECTED Final    Comment: (NOTE) The GeneXpert MRSA Assay (FDA approved for NASAL specimens only), is one component of a comprehensive MRSA colonization surveillance program. It is not intended to diagnose MRSA infection nor to guide or monitor treatment for MRSA infections. Test performance is not FDA approved in patients less than 23 years old. Performed at [facility], [address] [place], [place] [address]     Coagulation Studies: Recent Labs    08/22/22 1115  LABPROT 16.9*  INR 1.4*    Urinalysis: Recent Labs    08/22/22 0709  COLORURINE AMBER*  LABSPEC >1.046*  PHURINE 5.0  GLUCOSEU 150*  HGBUR SMALL*  BILIRUBINUR NEGATIVE  KETONESUR NEGATIVE  PROTEINUR >=300*  NITRITE NEGATIVE  LEUKOCYTESUR NEGATIVE       Imaging: US Venous Img Lower Bilateral (DVT)  Result Date: 08/23/2022 CLINICAL DATA:  Acute pulmonary emboli EXAM: BILATERAL LOWER EXTREMITY VENOUS DOPPLER ULTRASOUND TECHNIQUE: Gray-scale sonography with graded compression, as well as color Doppler and duplex ultrasound were performed to evaluate the lower extremity deep venous systems from the level of the common femoral vein and including the common femoral, femoral, profunda femoral, popliteal and calf veins including the posterior tibial, peroneal and gastrocnemius veins when visible. Spectral Doppler was utilized to evaluate flow  at rest and with distal augmentation maneuvers in the common femoral, femoral and popliteal veins. COMPARISON:  None Available. FINDINGS: RIGHT LOWER EXTREMITY Common Femoral Vein: No evidence of thrombus. Normal compressibility, respiratory phasicity and response to augmentation. Saphenofemoral Junction: No evidence of thrombus. Normal compressibility and flow on color Doppler imaging. Profunda Femoral Vein: No evidence of thrombus. Normal compressibility and flow on color Doppler imaging. Femoral Vein: No evidence of thrombus. Normal compressibility, respiratory phasicity and response to augmentation. Popliteal Vein: No evidence of thrombus. Normal compressibility, respiratory phasicity and response to augmentation. Calf Veins: No evidence of thrombus. Normal compressibility and flow on color Doppler imaging. Other: Superficial thrombosis noted of the proximal calf small saphenous vein. No propagation into the deep venous system. LEFT LOWER EXTREMITY Common Femoral Vein: No evidence of thrombus. Normal compressibility, respiratory phasicity and response to augmentation. Saphenofemoral Junction: No evidence of thrombus. Normal compressibility and flow on color Doppler imaging. Profunda Femoral Vein: No evidence of thrombus. Normal compressibility and flow on color Doppler imaging. Femoral Vein: No evidence of thrombus. Normal compressibility, respiratory phasicity and response to augmentation. Popliteal Vein: No evidence of thrombus. Normal compressibility, respiratory phasicity and response to augmentation. Calf Veins: No evidence of thrombus. Normal compressibility and flow on color Doppler imaging. IMPRESSION: No evidence of deep venous thrombosis in either lower extremity. Right proximal calf small saphenous vein superficial thrombosis (very minor) Electronically Signed   By: Jerilynn Mages.  Shick M.D.   On: 08/23/2022 09:48   PERIPHERAL VASCULAR CATHETERIZATION  Result Date: 08/22/2022 See surgical note for  result.  ECHOCARDIOGRAM COMPLETE  Result Date: 08/22/2022    ECHOCARDIOGRAM REPORT   Patient Name:   HUDSYN TRAW Date of Exam: 08/22/2022 Medical Rec #:  [id number]           Height:       59.0 in Accession #:    [id number]          Weight:       120.1 lb Date of Birth:  2000-11-16           BSA:          1.485 m Patient Age:    21 years            BP:           90/42 mmHg Patient Gender: F                   HR:           103 bpm. Exam Location:  [facility] Procedure: 2D Echo, Color Doppler and Cardiac Doppler STAT ECHO Indications:     Pulmonary embolus I26.09  History:         Patient has prior history of Echocardiogram examinations, most                  recent 09/27/2021. Risk Factors:Diabetes and Dyslipidemia.  Tobacco dependence.  Sonographer:     Sherrie Sport Referring Phys:  [place] Diagnosing Phys: Nelva Bush MD IMPRESSIONS  1. Left ventricular ejection fraction, by estimation, is 60 to 65%. The left ventricle has normal function. The left ventricle has no regional wall motion abnormalities. There is mild left ventricular hypertrophy. Left ventricular diastolic parameters are consistent with Grade I diastolic dysfunction (impaired relaxation). There is the interventricular septum is flattened in systole and diastole, consistent with right ventricular pressure and volume overload.  2. Right ventricular systolic function demonstrates moderate hypokinesis of the free wall with sparing of the apex consistent with acute cor pulmonale (McConnell's sign). The right ventricular size is moderately enlarged. Moderately increased right ventricular wall thickness. There is severely elevated pulmonary artery systolic pressure. The estimated right ventricular systolic pressure is 123XX123 mmHg.  3. A small pericardial effusion is present.  4. The mitral valve is normal in structure. No evidence of mitral valve regurgitation. No evidence of mitral stenosis.  5. Tricuspid valve regurgitation  is moderate to severe.  6. The aortic valve is tricuspid. Aortic valve regurgitation is not visualized. No aortic stenosis is present.  7. The inferior vena cava is normal in size with greater than 50% respiratory variability, suggesting right atrial pressure of 3 mmHg. FINDINGS  Left Ventricle: Left ventricular ejection fraction, by estimation, is 60 to 65%. The left ventricle has normal function. The left ventricle has no regional wall motion abnormalities. The left ventricular internal cavity size was normal in size. There is  mild left ventricular hypertrophy. The interventricular septum is flattened in systole and diastole, consistent with right ventricular pressure and volume overload. Left ventricular diastolic parameters are consistent with Grade I diastolic dysfunction (impaired relaxation). Right Ventricle: The right ventricular size is moderately enlarged. Moderately increased right ventricular wall thickness. Right ventricular systolic function demonstrates moderate hypokinesis of the free wall with sparing of the apex consistent with acute cor pulmonale (McConnell's sign). There is severely elevated pulmonary artery systolic pressure. The tricuspid regurgitant velocity is 4.78 m/s, and with an assumed right atrial pressure of 3 mmHg, the estimated right ventricular systolic pressure is 123XX123 mmHg. Left Atrium: Left atrial size was normal in size. Right Atrium: Right atrial size was normal in size. Pericardium: A small pericardial effusion is present. Mitral Valve: The mitral valve is normal in structure. No evidence of mitral valve regurgitation. No evidence of mitral valve stenosis. Tricuspid Valve: The tricuspid valve is normal in structure. Tricuspid valve regurgitation is moderate to severe. Aortic Valve: The aortic valve is tricuspid. Aortic valve regurgitation is not visualized. No aortic stenosis is present. Aortic valve mean gradient measures 2.0 mmHg. Aortic valve peak gradient measures 3.1  mmHg. Aortic valve area, by VTI measures 2.33 cm. Pulmonic Valve: The pulmonic valve was not well visualized. Pulmonic valve regurgitation is mild. No evidence of pulmonic stenosis. Aorta: The aortic root is normal in size and structure. Pulmonary Artery: The pulmonary artery is not well seen. Venous: The inferior vena cava is normal in size with greater than 50% respiratory variability, suggesting right atrial pressure of 3 mmHg. IAS/Shunts: The interatrial septum was not well visualized.  LEFT VENTRICLE PLAX 2D LVIDd:         2.30 cm   Diastology LVIDs:         1.50 cm   LV e' medial:    5.55 cm/s LV PW:         1.20 cm   LV E/e' medial:  12.8 LV  IVS:        1.20 cm   LV e' lateral:   10.60 cm/s LVOT diam:     1.80 cm   LV E/e' lateral: 6.7 LV SV:         25 LV SV Index:   17 LVOT Area:     2.54 cm  RIGHT VENTRICLE RV Basal diam:  3.90 cm RV Mid diam:    3.25 cm RV S prime:     11.90 cm/s TAPSE (M-mode): 1.7 cm LEFT ATRIUM             Index       RIGHT ATRIUM           Index LA diam:        1.10 cm 0.74 cm/m  RA Area:     15.00 cm LA Vol (A2C):   10.6 ml 7.14 ml/m  RA Volume:   42.00 ml  28.28 ml/m LA Vol (A4C):   9.7 ml  6.54 ml/m LA Biplane Vol: 10.7 ml 7.20 ml/m  AORTIC VALVE                    PULMONIC VALVE AV Area (Vmax):    2.01 cm     PR End Diast Vel: 25.00 msec AV Area (Vmean):   1.97 cm AV Area (VTI):     2.33 cm AV Vmax:           88.20 cm/s AV Vmean:          60.900 cm/s AV VTI:            0.107 m AV Peak Grad:      3.1 mmHg AV Mean Grad:      2.0 mmHg LVOT Vmax:         69.50 cm/s LVOT Vmean:        47.100 cm/s LVOT VTI:          0.098 m LVOT/AV VTI ratio: 0.92  AORTA Ao Root diam: 2.10 cm MITRAL VALVE                TRICUSPID VALVE MV Area (PHT): 6.07 cm     TR Peak grad:   91.4 mmHg MV Decel Time: 125 msec     TR Vmax:        478.00 cm/s MV E velocity: 71.30 cm/s MV A velocity: 113.00 cm/s  SHUNTS MV E/A ratio:  0.63         Systemic VTI:  0.10 m                             Systemic  Diam: 1.80 cm Nelva Bush MD Electronically signed by Nelva Bush MD Signature Date/Time: 08/22/2022/2:28:41 PM    Final    CT Angio Chest PE W and/or Wo Contrast  Result Date: 08/22/2022 CLINICAL DATA:  Concern for pulmonary embolus. EXAM: CT ANGIOGRAPHY CHEST WITH CONTRAST TECHNIQUE: Multidetector CT imaging of the chest was performed using the standard protocol during bolus administration of intravenous contrast. Multiplanar CT image reconstructions and MIPs were obtained to evaluate the vascular anatomy. RADIATION DOSE REDUCTION: This exam was performed according to the departmental dose-optimization program which includes automated exposure control, adjustment of the mA and/or kV according to patient size and/or use of iterative reconstruction technique. CONTRAST:  61m OMNIPAQUE IOHEXOL 350 MG/ML SOLN COMPARISON:  CT abdomen and pelvis September 26, 2021. FINDINGS: Cardiovascular: Filling defects within the right  main pulmonary artery extending into multiple segmental and subsegmental branches predominantly involving the right lower and middle lobes. As well there are multiple left-sided segmental and subsegmental pulmonary artery filling defects. Right heart enlargement with a right ventricular to left ventricular ratio of 1.9. Normal caliber thoracic aorta. Mediastinum/Nodes: No suspicious thyroid nodule. No pathologically enlarged mediastinal or hilar lymph nodes. The esophagus is grossly unremarkable. Lungs/Pleura: Multifocal bilateral upper lobe predominant pulmonary nodules/masses many of which are cavitary. For reference: -cavitary right upper lobe pulmonary mass measures 3.4 x 2.0 cm on image 54/8 -cavitary left upper lobe pulmonary nodule measures 10 mm on image 32/8. Bronchiectasis with clustered nodularity in the dependent bilateral lower lobes. Multifocal ground-glass opacities with subpleural sparing may reflect pulmonary hemorrhage or an infectious or inflammatory etiology. No pleural  effusion.  No pneumothorax. Upper Abdomen: Reflux of contrast into the IVC and hepatic veins. Hyperdensity vs focus of hyperenhancement in the right upper pole kidney on image 121/6. Musculoskeletal: No acute osseous abnormality. Review of the MIP images confirms the above findings. IMPRESSION: 1. Acute bilateral pulmonary emboli with CT evidence of right heart strain (RV/LV Ratio = 1.9 ) consistent with at least submassive (intermediate risk) PE. The presence of right heart strain has been associated with an increased risk of morbidity and mortality. Please refer to the "Code PE Focused" order set in EPIC. 2. Multifocal bilateral upper lobe predominant pulmonary nodules/masses many of which are cavitary. Findings are favored to reflect an atypical infectious process such as septic emboli. 3. Multifocal ground-glass opacities with subpleural sparing likely reflects a combination of pulmonary hemorrhage and/or an infectious or inflammatory etiology. 4. Hyperdensity vs focus of hyperenhancement in the right upper pole kidney, nonspecific suggest further evaluation with CT of the abdomen and pelvis upon stabilization of patient. Critical Value/emergent results were called by telephone at the time of interpretation on 08/22/2022 at 9:15 am to provider Dr Bland Span, who verbally acknowledged these results. Electronically Signed   By: Dahlia Bailiff M.D.   On: 08/22/2022 09:15   DG Chest Portable 1 View  Result Date: 08/22/2022 CLINICAL DATA:  22 year old female with history of shortness of breath. EXAM: PORTABLE CHEST 1 VIEW COMPARISON:  Chest x-ray 09/27/2021. FINDINGS: Widespread areas of interstitial prominence and architectural distortion are noted throughout the mid to lower lungs bilaterally (right greater than left), similar to the prior study from 09/26/2021, presumably areas of chronic post infectious or inflammatory scarring. No pleural effusions. No pneumothorax. No evidence of pulmonary edema. Heart size is  normal. Upper mediastinal contours are within normal limits. IMPRESSION: 1. Grossly abnormal chest x-ray with findings which appear to be chronic based on comparison with prior chest radiographs. Lung bases were grossly abnormal on prior CT of the abdomen and pelvis 09/26/2021. Further evaluation with high-resolution chest CT is suggested to better delineate these findings which are very abnormal in a 22 year old patient. Electronically Signed   By: Vinnie Langton M.D.   On: 08/22/2022 06:58     Medications:    azithromycin (ZITHROMAX) 500 mg in sodium chloride 0.9 % 250 mL IVPB 500 mg (08/23/22 0739)   ceFEPime (MAXIPIME) IV 2 g (08/23/22 1144)   lactated ringers 75 mL/hr at 08/22/22 1436    apixaban  10 mg Oral BID   Followed by   Derrill Memo ON 08/30/2022] apixaban  5 mg Oral BID   atorvastatin  40 mg Oral Daily   Chlorhexidine Gluconate Cloth  6 each Topical Daily   docusate sodium  100 mg Oral BID  feeding supplement (GLUCERNA SHAKE)  237 mL Oral BID BM   insulin aspart  0-5 Units Subcutaneous QHS   insulin aspart  0-9 Units Subcutaneous TID WC   insulin glargine-yfgn  20 Units Subcutaneous QHS   methylPREDNISolone (SOLU-MEDROL) injection  125 mg Intravenous Daily   [START ON 08/24/2022] multivitamin with minerals  1 tablet Oral Daily   nicotine  14 mg Transdermal Daily   sodium chloride flush  3 mL Intravenous Q12H   sulfamethoxazole-trimethoprim  1 tablet Oral Q M,W,F   acetaminophen **OR** acetaminophen, albuterol, bisacodyl, fentaNYL (SUBLIMAZE) injection, hydrALAZINE, HYDROmorphone (DILAUDID) injection, ondansetron (ZOFRAN) IV, ondansetron **OR** [DISCONTINUED] ondansetron (ZOFRAN) IV, oxyCODONE, polyethylene glycol  Assessment/ Plan:  [name] is a 22 y.o. 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 Most recent urine with 5.6 grams of proteinuria on 07/13/2022. With hypoalbuminemia Continue high dose steroids for 1 day. Will need steroid therapy beyond discharge.  Continue PJP prophylaxis medications, TMP/SMX Will need ARB for renal preservation once blood pressure improved.  Will contact [facility] to discuss treatment plan.   Acute pulmonary embolism: with hypoalbuminemia and nephrotic syndrome. Patient will need continue anticoagulation. Prescribed apixaban, ran out prior to admission.   Hypotension: Continue supportive care.   Secondary Hyperparathyroidism: not currently on an activated vitamin D.    LOS: 1 [place] 2/21/202412:01 PM

## 2022-08-24 DIAGNOSIS — I2602 Saddle embolus of pulmonary artery with acute cor pulmonale: Secondary | ICD-10-CM

## 2022-08-24 DIAGNOSIS — N040 Nephrotic syndrome with minor glomerular abnormality: Secondary | ICD-10-CM

## 2022-08-24 LAB — BASIC METABOLIC PANEL
Anion gap: 5 (ref 5–15)
BUN: 39 mg/dL — ABNORMAL HIGH (ref 6–20)
CO2: 20 mmol/L — ABNORMAL LOW (ref 22–32)
Calcium: 7.2 mg/dL — ABNORMAL LOW (ref 8.9–10.3)
Chloride: 108 mmol/L (ref 98–111)
Creatinine, Ser: 1.39 mg/dL — ABNORMAL HIGH (ref 0.44–1.00)
GFR, Estimated: 55 mL/min — ABNORMAL LOW (ref >60)
Glucose, Bld: 144 mg/dL — ABNORMAL HIGH (ref 70–99)
Potassium: 4.3 mmol/L (ref 3.5–5.1)
Sodium: 133 mmol/L — ABNORMAL LOW (ref 135–145)

## 2022-08-24 LAB — ANCA PROFILE
Anti-MPO Antibodies: <0.2 units (ref 0.0–0.9)
Anti-PR3 Antibodies: <0.2 units (ref 0.0–0.9)
Atypical P-ANCA titer: <1:20 {titer}
C-ANCA: <1:20 {titer}
P-ANCA: <1:20 {titer}

## 2022-08-24 LAB — CBC
HCT: 29.4 % — ABNORMAL LOW (ref 36.0–46.0)
Hemoglobin: 9.8 g/dL — ABNORMAL LOW (ref 12.0–15.0)
MCH: 28 pg (ref 26.0–34.0)
MCHC: 33.3 g/dL (ref 30.0–36.0)
MCV: 84 fL (ref 80.0–100.0)
Platelets: 284 10*3/uL (ref 150–400)
RBC: 3.5 MIL/uL — ABNORMAL LOW (ref 3.87–5.11)
RDW: 14.7 % (ref 11.5–15.5)
WBC: 16.4 10*3/uL — ABNORMAL HIGH (ref 4.0–10.5)
nRBC: 0 % (ref 0.0–0.2)

## 2022-08-24 LAB — ANA W/REFLEX IF POSITIVE: Anti Nuclear Antibody (ANA): NEGATIVE

## 2022-08-24 LAB — GLUCOSE, CAPILLARY
Glucose-Capillary: 99 mg/dL (ref 70–99)
Glucose-Capillary: 75 mg/dL (ref 70–99)
Glucose-Capillary: 301 mg/dL — ABNORMAL HIGH (ref 70–99)
Glucose-Capillary: 312 mg/dL — ABNORMAL HIGH (ref 70–99)

## 2022-08-24 LAB — MAGNESIUM: Magnesium: 2 mg/dL (ref 1.7–2.4)

## 2022-08-24 LAB — KAPPA/LAMBDA LIGHT CHAINS
Kappa free light chain: 31.2 mg/L — ABNORMAL HIGH (ref 3.3–19.4)
Kappa, lambda light chain ratio: 1.59 (ref 0.26–1.65)
Lambda free light chains: 19.6 mg/L (ref 5.7–26.3)

## 2022-08-24 LAB — CRYPTOCOCCUS ANTIGEN, SERUM: Cryptococcus Antigen, Serum: NEGATIVE

## 2022-08-24 LAB — PHOSPHORUS: Phosphorus: 4.1 mg/dL (ref 2.5–4.6)

## 2022-08-24 MED ORDER — FUROSEMIDE 10 MG/ML IJ SOLN
40.0000 mg | Freq: Once | INTRAMUSCULAR | Status: AC
  Administered 2022-08-24: 40 mg via INTRAVENOUS
  Filled 2022-08-24: qty 4

## 2022-08-24 MED ORDER — AMOXICILLIN-POT CLAVULANATE 875-125 MG PO TABS
1.0000 | ORAL_TABLET | Freq: Two times a day (BID) | ORAL | Status: DC
  Administered 2022-08-24 – 2022-08-25 (×2): 1 via ORAL
  Filled 2022-08-24 (×2): qty 1

## 2022-08-24 MED ORDER — PREDNISONE 50 MG PO TABS
50.0000 mg | ORAL_TABLET | Freq: Every day | ORAL | Status: DC
  Administered 2022-08-25: 50 mg via ORAL
  Filled 2022-08-24: qty 1

## 2022-08-24 NOTE — Progress Notes (Signed)
PROGRESS NOTE    Catherine [name]  [id number] DOB: [birth year] DOA: 08/22/2022 PCP: [facility] [place]  118A/118A-AA  LOS: 2 days   Brief hospital course:   Assessment & Plan: Catherine [name] is a 22 y.o. female with medical history significant of DM and nephrotic/minimal change syndrome presenting with SOB.   She reports that she developed SOB starting earlier this week, present with movement.  She was coughing up green phlegm that started at the same time as the SOB.  Some wheezing.  No fever.  No sick contacts.  No leg pain/swelling.  Remote h/o collapsed lung, required chest tube and [facility] Flight admission to [facility]; no other respiratory history.   Sje was diagnosed with nephrotic syndrome at age 63.  On/off prednisone, tacrolimus over the years. Relapsing/remitting.  Not on a transplant list.   Pulmonary embolism with R heart strain S/p s/p pulmonary thrombectomy on 2/20 -Patient without prior episodes of thromboembolic disease presenting with new submassive PE -She was hospitalized at [facility] in January and was discharged on Eliquis due her significantly hypoalbuminemia; she ran out of the Eliquis and did not obtain refills --started on heparin gtt, transitioned to Eliquis -Smoking cessation has been strongly encouraged by admitting physician- both tobacco and marijuana -Based on her severely low albumin she is at high risk (>8%/year) for recurrent VTE and should be maintained on indefinite AC. -The patient understands that thromboembolic disease can be catastrophic and even deadly and that she must be complaint with physician appointments and anticoagulation. Plan: --cont Eliquis --Follow-up with vascular surgery in 1 month.    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  Procal elevated. -started on Cefepime, Azithromycin and Vanc on admission, vanc d/c'ed after MRSA screen neg --pulm consulted. 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  -Renal function appears to be stable at this time after her recent flare; she was hospitalized at [facility] from 12/29-1/3 and treated with high-dose prednisone (supposed to be continued for at least 4 weeks) and tacrolimus -Intermittent takes tacrolimus and prednisone.  Prescribed Bactrim 3x/week but not taking. --nephrology consulted.  Started on stress dose steroid, IV solumedrol 125 mg daily for 3 days.  resumed Bactrim. --received IVF Plan: --cont prednisone 50 mg daily --cont Bactrim for PJP ppx --Will need ARB for renal preservation once blood pressure improved.  -Also with apparent hyperdensity focus of the kidney on imaging; likely needs a dedicated CT A/P once more stable   Uncontrolled DM1 -Poorly controlled, most recent A1c was 9.6 on 12/29 --cont glargine 20u nightly --mealtime 3u TID --ACHS and SSI    HLD -Continue atorvastatin   Tobacco abuse -Cessation encouraged   Marijuana abuse -Cessation encouraged; this should be encouraged on an ongoing basis   DVT prophylaxis: [id number] Code Status: Full code  Family Communication: father updated at bedside today Level of care: Med-Surg Dispo:   The patient is from: home Anticipated d/c is to: home Anticipated d/c date is: 1-2 days   Subjective and Interval History:  Still complaining of cough.  Swelling in face and tongue improved.     Objective: Vitals:   08/24/22 0456 08/24/22 0457 08/24/22 0816 08/24/22 1627  BP:  100/70 97/62 100/61  Pulse:  84 81 94  Resp:  16 16 20  $ Temp:  97.9 F (36.6 C) 98.1 F (36.7 C) 98 F (  36.7 C)  TempSrc:      SpO2:  100% 100% 99%  Weight: 62 kg     Height:        Intake/Output Summary (Last 24 hours) at 08/24/2022 1838 Last data filed at 08/24/2022 1220 Gross per 24 hour  Intake 461 ml   Output 600 ml  Net -139 ml   Filed Weights   08/22/22 0630 08/22/22 1243 08/24/22 0456  Weight: 52.2 kg 54.5 kg 62 kg    Examination:   Constitutional: NAD, AAOx3 HEENT: conjunctivae and lids normal, EOMI CV: No cyanosis.   RESP: normal respiratory effort, on RA Extremities: No effusions, edema in BLE SKIN: warm, dry Neuro: II - XII grossly intact.   Psych: subdued mood and affect.     Data Reviewed: I have personally reviewed labs and imaging studies  Time spent: 35 minutes  Catherine Bi, MD [facility] If 7PM-7AM, please contact night-coverage 08/24/2022, 6:38 PM

## 2022-08-24 NOTE — Progress Notes (Signed)
[facility]  ROUNDING NOTE   Subjective:   [name] was admitted to [facility] on 08/22/2022 for SOB (shortness of breath) [R06.02] Pulmonary embolism with acute cor pulmonale ([place]) [I26.09]  Patient presents with her mother who assists with history taking. Patient started a new job yesterday. Patient presents with shortness of breath. She was found to have acute pulmonary embolism. Seems that she ran out of her apixaban.   Patient was admitted to [facility] from 12/29 to 1/3 with acute flare of minimal change disease with acute kidney injury. She was discharged on prednisone and tacrolimus.   Patient seen sitting at side of bed Family at bedside Family states she just took medications and was feeling nauseous Patient states she feel nauseous but declines medications No lower extremity edema, facial edema remains  Objective:  Vital signs in last 24 hours:  Temp:  [97.9 F (36.6 C)-98.6 F (37 C)] 98.1 F (36.7 C) (02/22 0816) Pulse Rate:  [81-100] 81 (02/22 0816) Resp:  [16-27] 16 (02/22 0816) BP: (89-106)/(60-73) 97/62 (02/22 0816) SpO2:  [90 %-100 %] 100 % (02/22 0816) Weight:  [62 kg] 62 kg (02/22 0456)  Weight change: 7.5 kg Filed Weights   08/22/22 0630 08/22/22 1243 08/24/22 0456  Weight: 52.2 kg 54.5 kg 62 kg    Intake/Output: I/O last 3 completed shifts: In: 1326.4 [P.O.:340; I.V.:436.5; IV Piggyback:549.9] Out: 500 [Urine:500]   Intake/Output this shift:  No intake/output data recorded.  Physical Exam: General: NAD, ill appearing  Head: Normocephalic, atraumatic. Moist oral mucosal membranes, facial edema  Eyes: Anicteric  Lungs:  Clear to auscultation  Heart: Regular rate and rhythm  Abdomen:  Soft, nontender  Extremities:  no peripheral edema.  Neurologic: Nonfocal, moving all four extremities  Skin: No lesions        Basic Metabolic Panel: Recent Labs  Lab 08/22/22 0708 08/23/22 0621 08/23/22 2235 08/24/22 0522  NA 132* 128*   --  133*  K 4.2 5.2*  --  4.3  CL 102 105  --  108  CO2 23 20*  --  20*  GLUCOSE 224* 294* 361* 144*  BUN 32* 35*  --  39*  CREATININE 1.32* 1.43*  --  1.39*  CALCIUM 7.9* 7.2*  --  7.2*  MG  --  1.8  --  2.0  PHOS  --  4.3  --  4.1     Liver Function Tests: Recent Labs  Lab 08/22/22 0708 08/23/22 0621  AST 19 17  ALT 17 12  ALKPHOS 185* 117  BILITOT 0.3 0.4  PROT 5.6* 4.0*  ALBUMIN <1.5* <1.5*    No results for input(s): "LIPASE", "AMYLASE" in the last 168 hours. No results for input(s): "AMMONIA" in the last 168 hours.  CBC: Recent Labs  Lab 08/22/22 0708 08/23/22 0621 08/24/22 0522  WBC 15.2* 26.1* 16.4*  NEUTROABS 9.2*  --   --   HGB 15.6* 9.6* 9.8*  HCT 47.4* 29.0* 29.4*  MCV 84.6 84.5 84.0  PLT 300 293 284     Cardiac Enzymes: No results for input(s): "CKTOTAL", "CKMB", "CKMBINDEX", "TROPONINI" in the last 168 hours.  BNP: Invalid input(s): "POCBNP"  CBG: Recent Labs  Lab 08/23/22 0745 08/23/22 1137 08/23/22 1602 08/23/22 2055 08/24/22 0817  GLUCAP 293* 182* 166* 415* 77     Microbiology: Results for orders placed or performed during the hospital encounter of 08/22/22  Resp panel by RT-PCR (RSV, Flu A&B, Covid) Anterior Nasal Swab     Status:  None   Collection Time: 08/22/22  7:08 AM   Specimen: Anterior Nasal Swab  Result Value Ref Range Status   SARS Coronavirus 2 by RT PCR NEGATIVE NEGATIVE Final    Comment: (NOTE) SARS-CoV-2 target nucleic acids are NOT DETECTED.  The SARS-CoV-2 RNA is generally detectable in upper respiratory specimens during the acute phase of infection. The lowest concentration of SARS-CoV-2 viral copies this assay can detect is 138 copies/mL. A negative result does not preclude SARS-Cov-2 infection and should not be used as the sole basis for treatment or other patient management decisions. A negative result may occur with  improper specimen collection/handling, submission of specimen other than  nasopharyngeal swab, presence of viral mutation(s) within the areas targeted by this assay, and inadequate number of viral copies(<138 copies/mL). A negative result must be combined with clinical observations, patient history, and epidemiological information. The expected result is Negative.  Fact Sheet for Patients:  EntrepreneurPulse.com.au  Fact Sheet for Healthcare Providers:  IncredibleEmployment.be  This test is no t yet approved or cleared by the [place] FDA and  has been authorized for detection and/or diagnosis of SARS-CoV-2 by FDA under an Emergency Use Authorization (EUA). This EUA will remain  in effect (meaning this test can be used) for the duration of the COVID-19 declaration under Section 564(b)(1) of the Act, 21 U.S.C.section 360bbb-3(b)(1), unless the authorization is terminated  or revoked sooner.       Influenza A by PCR NEGATIVE NEGATIVE Final   Influenza B by PCR NEGATIVE NEGATIVE Final    Comment: (NOTE) The Xpert Xpress SARS-CoV-2/FLU/RSV plus assay is intended as an aid in the diagnosis of influenza from Nasopharyngeal swab specimens and should not be used as a sole basis for treatment. Nasal washings and aspirates are unacceptable for Xpert Xpress SARS-CoV-2/FLU/RSV testing.  Fact Sheet for Patients: EntrepreneurPulse.com.au  Fact Sheet for Healthcare Providers: IncredibleEmployment.be  This test is not yet approved or cleared by the [place] FDA and has been authorized for detection and/or diagnosis of SARS-CoV-2 by FDA under an Emergency Use Authorization (EUA). This EUA will remain in effect (meaning this test can be used) for the duration of the COVID-19 declaration under Section 564(b)(1) of the Act, 21 U.S.C. section 360bbb-3(b)(1), unless the authorization is terminated or revoked.     Resp Syncytial Virus by PCR NEGATIVE NEGATIVE Final    Comment:  (NOTE) Fact Sheet for Patients: EntrepreneurPulse.com.au  Fact Sheet for Healthcare Providers: IncredibleEmployment.be  This test is not yet approved or cleared by the [place] FDA and has been authorized for detection and/or diagnosis of SARS-CoV-2 by FDA under an Emergency Use Authorization (EUA). This EUA will remain in effect (meaning this test can be used) for the duration of the COVID-19 declaration under Section 564(b)(1) of the Act, 21 U.S.C. section 360bbb-3(b)(1), unless the authorization is terminated or revoked.  Performed at [facility], [address] [place], [place] [address]   Blood culture (routine x 2)     Status: None (Preliminary result)   Collection Time: 08/22/22  9:18 AM   Specimen: BLOOD  Result Value Ref Range Status   Specimen Description BLOOD BLOOD LEFT ARM  Final   Special Requests   Final    BOTTLES DRAWN AEROBIC AND ANAEROBIC Blood Culture results may not be optimal due to an inadequate volume of blood received in culture bottles   Culture   Final    NO GROWTH 2 DAYS Performed at [facility], [address],  [place], [place] [address]    Report Status PENDING  Incomplete  Blood culture (routine x 2)     Status: None (Preliminary result)   Collection Time: 08/22/22 12:02 PM   Specimen: BLOOD RIGHT HAND  Result Value Ref Range Status   Specimen Description BLOOD RIGHT HAND  Final   Special Requests   Final    BOTTLES DRAWN AEROBIC ONLY Blood Culture results may not be optimal due to an inadequate volume of blood received in culture bottles   Culture   Final    NO GROWTH 2 DAYS Performed at [facility], [address] [place], [place] [address]    Report Status PENDING  Incomplete  MRSA Next Gen by PCR, Nasal     Status: None   Collection Time: 08/22/22 12:47 PM   Specimen: Nasal Mucosa; Nasal Swab  Result Value Ref Range Status   MRSA by PCR Next Gen NOT DETECTED NOT  DETECTED Final    Comment: (NOTE) The GeneXpert MRSA Assay (FDA approved for NASAL specimens only), is one component of a comprehensive MRSA colonization surveillance program. It is not intended to diagnose MRSA infection nor to guide or monitor treatment for MRSA infections. Test performance is not FDA approved in patients less than 83 years old. Performed at [facility], [address] [place], [place] [address]     Coagulation Studies: Recent Labs    08/22/22 1115  LABPROT 16.9*  INR 1.4*     Urinalysis: Recent Labs    08/22/22 0709  COLORURINE AMBER*  LABSPEC >1.046*  PHURINE 5.0  GLUCOSEU 150*  HGBUR SMALL*  BILIRUBINUR NEGATIVE  KETONESUR NEGATIVE  PROTEINUR >=300*  NITRITE NEGATIVE  LEUKOCYTESUR NEGATIVE       Imaging: US Venous Img Lower Bilateral (DVT)  Result Date: 08/23/2022 CLINICAL DATA:  Acute pulmonary emboli EXAM: BILATERAL LOWER EXTREMITY VENOUS DOPPLER ULTRASOUND TECHNIQUE: Gray-scale sonography with graded compression, as well as color Doppler and duplex ultrasound were performed to evaluate the lower extremity deep venous systems from the level of the common femoral vein and including the common femoral, femoral, profunda femoral, popliteal and calf veins including the posterior tibial, peroneal and gastrocnemius veins when visible. Spectral Doppler was utilized to evaluate flow at rest and with distal augmentation maneuvers in the common femoral, femoral and popliteal veins. COMPARISON:  None Available. FINDINGS: RIGHT LOWER EXTREMITY Common Femoral Vein: No evidence of thrombus. Normal compressibility, respiratory phasicity and response to augmentation. Saphenofemoral Junction: No evidence of thrombus. Normal compressibility and flow on color Doppler imaging. Profunda Femoral Vein: No evidence of thrombus. Normal compressibility and flow on color Doppler imaging. Femoral Vein: No evidence of thrombus. Normal compressibility, respiratory  phasicity and response to augmentation. Popliteal Vein: No evidence of thrombus. Normal compressibility, respiratory phasicity and response to augmentation. Calf Veins: No evidence of thrombus. Normal compressibility and flow on color Doppler imaging. Other: Superficial thrombosis noted of the proximal calf small saphenous vein. No propagation into the deep venous system. LEFT LOWER EXTREMITY Common Femoral Vein: No evidence of thrombus. Normal compressibility, respiratory phasicity and response to augmentation. Saphenofemoral Junction: No evidence of thrombus. Normal compressibility and flow on color Doppler imaging. Profunda Femoral Vein: No evidence of thrombus. Normal compressibility and flow on color Doppler imaging. Femoral Vein: No evidence of thrombus. Normal compressibility, respiratory phasicity and response to augmentation. Popliteal Vein: No evidence of thrombus. Normal compressibility, respiratory phasicity and response to augmentation. Calf Veins: No evidence of thrombus. Normal compressibility and flow on color Doppler imaging.  IMPRESSION: No evidence of deep venous thrombosis in either lower extremity. Right proximal calf small saphenous vein superficial thrombosis (very minor) Electronically Signed   By: Jerilynn Mages.  Shick M.D.   On: 08/23/2022 09:48   PERIPHERAL VASCULAR CATHETERIZATION  Result Date: 08/22/2022 See surgical note for result.  ECHOCARDIOGRAM COMPLETE  Result Date: 08/22/2022    ECHOCARDIOGRAM REPORT   Patient Name:   Catherine [name] Date of Exam: 08/22/2022 Medical Rec #:  [id number]           Height:       59.0 in Accession #:    [id number]          Weight:       120.1 lb Date of Birth:  August 10, 2000           BSA:          1.485 m Patient Age:    21 years            BP:           90/42 mmHg Patient Gender: F                   HR:           103 bpm. Exam Location:  [facility] Procedure: 2D Echo, Color Doppler and Cardiac Doppler STAT ECHO Indications:     Pulmonary embolus I26.09  History:          Patient has prior history of Echocardiogram examinations, most                  recent 09/27/2021. Risk Factors:Diabetes and Dyslipidemia.                  Tobacco dependence.  Sonographer:     Sherrie Sport Referring Phys:  [place] Diagnosing Phys: Nelva Bush MD IMPRESSIONS  1. Left ventricular ejection fraction, by estimation, is 60 to 65%. The left ventricle has normal function. The left ventricle has no regional wall motion abnormalities. There is mild left ventricular hypertrophy. Left ventricular diastolic parameters are consistent with Grade I diastolic dysfunction (impaired relaxation). There is the interventricular septum is flattened in systole and diastole, consistent with right ventricular pressure and volume overload.  2. Right ventricular systolic function demonstrates moderate hypokinesis of the free wall with sparing of the apex consistent with acute cor pulmonale (McConnell's sign). The right ventricular size is moderately enlarged. Moderately increased right ventricular wall thickness. There is severely elevated pulmonary artery systolic pressure. The estimated right ventricular systolic pressure is 123XX123 mmHg.  3. A small pericardial effusion is present.  4. The mitral valve is normal in structure. No evidence of mitral valve regurgitation. No evidence of mitral stenosis.  5. Tricuspid valve regurgitation is moderate to severe.  6. The aortic valve is tricuspid. Aortic valve regurgitation is not visualized. No aortic stenosis is present.  7. The inferior vena cava is normal in size with greater than 50% respiratory variability, suggesting right atrial pressure of 3 mmHg. FINDINGS  Left Ventricle: Left ventricular ejection fraction, by estimation, is 60 to 65%. The left ventricle has normal function. The left ventricle has no regional wall motion abnormalities. The left ventricular internal cavity size was normal in size. There is  mild left ventricular hypertrophy. The  interventricular septum is flattened in systole and diastole, consistent with right ventricular pressure and volume overload. Left ventricular diastolic parameters are consistent with Grade I diastolic dysfunction (impaired relaxation). Right Ventricle: The right ventricular size is moderately  enlarged. Moderately increased right ventricular wall thickness. Right ventricular systolic function demonstrates moderate hypokinesis of the free wall with sparing of the apex consistent with acute cor pulmonale (McConnell's sign). There is severely elevated pulmonary artery systolic pressure. The tricuspid regurgitant velocity is 4.78 m/s, and with an assumed right atrial pressure of 3 mmHg, the estimated right ventricular systolic pressure is 123XX123 mmHg. Left Atrium: Left atrial size was normal in size. Right Atrium: Right atrial size was normal in size. Pericardium: A small pericardial effusion is present. Mitral Valve: The mitral valve is normal in structure. No evidence of mitral valve regurgitation. No evidence of mitral valve stenosis. Tricuspid Valve: The tricuspid valve is normal in structure. Tricuspid valve regurgitation is moderate to severe. Aortic Valve: The aortic valve is tricuspid. Aortic valve regurgitation is not visualized. No aortic stenosis is present. Aortic valve mean gradient measures 2.0 mmHg. Aortic valve peak gradient measures 3.1 mmHg. Aortic valve area, by VTI measures 2.33 cm. Pulmonic Valve: The pulmonic valve was not well visualized. Pulmonic valve regurgitation is mild. No evidence of pulmonic stenosis. Aorta: The aortic root is normal in size and structure. Pulmonary Artery: The pulmonary artery is not well seen. Venous: The inferior vena cava is normal in size with greater than 50% respiratory variability, suggesting right atrial pressure of 3 mmHg. IAS/Shunts: The interatrial septum was not well visualized.  LEFT VENTRICLE PLAX 2D LVIDd:         2.30 cm   Diastology LVIDs:         1.50 cm    LV e' medial:    5.55 cm/s LV PW:         1.20 cm   LV E/e' medial:  12.8 LV IVS:        1.20 cm   LV e' lateral:   10.60 cm/s LVOT diam:     1.80 cm   LV E/e' lateral: 6.7 LV SV:         25 LV SV Index:   17 LVOT Area:     2.54 cm  RIGHT VENTRICLE RV Basal diam:  3.90 cm RV Mid diam:    3.25 cm RV S prime:     11.90 cm/s TAPSE (M-mode): 1.7 cm LEFT ATRIUM             Index       RIGHT ATRIUM           Index LA diam:        1.10 cm 0.74 cm/m  RA Area:     15.00 cm LA Vol (A2C):   10.6 ml 7.14 ml/m  RA Volume:   42.00 ml  28.28 ml/m LA Vol (A4C):   9.7 ml  6.54 ml/m LA Biplane Vol: 10.7 ml 7.20 ml/m  AORTIC VALVE                    PULMONIC VALVE AV Area (Vmax):    2.01 cm     PR End Diast Vel: 25.00 msec AV Area (Vmean):   1.97 cm AV Area (VTI):     2.33 cm AV Vmax:           88.20 cm/s AV Vmean:          60.900 cm/s AV VTI:            0.107 m AV Peak Grad:      3.1 mmHg AV Mean Grad:      2.0 mmHg LVOT Vmax:  69.50 cm/s LVOT Vmean:        47.100 cm/s LVOT VTI:          0.098 m LVOT/AV VTI ratio: 0.92  AORTA Ao Root diam: 2.10 cm MITRAL VALVE                TRICUSPID VALVE MV Area (PHT): 6.07 cm     TR Peak grad:   91.4 mmHg MV Decel Time: 125 msec     TR Vmax:        478.00 cm/s MV E velocity: 71.30 cm/s MV A velocity: 113.00 cm/s  SHUNTS MV E/A ratio:  0.63         Systemic VTI:  0.10 m                             Systemic Diam: 1.80 cm Nelva Bush MD Electronically signed by Nelva Bush MD Signature Date/Time: 08/22/2022/2:28:41 PM    Final      Medications:    azithromycin (ZITHROMAX) 500 mg in sodium chloride 0.9 % 250 mL IVPB 500 mg (08/24/22 0853)   ceFEPime (MAXIPIME) IV 2 g (08/24/22 0030)    apixaban  10 mg Oral BID   Followed by   Derrill Memo ON 08/30/2022] apixaban  5 mg Oral BID   atorvastatin  40 mg Oral Daily   docusate sodium  100 mg Oral BID   feeding supplement (GLUCERNA SHAKE)  237 mL Oral BID BM   furosemide  40 mg Intravenous Once   insulin aspart  0-5 Units  Subcutaneous QHS   insulin aspart  0-9 Units Subcutaneous TID WC   insulin aspart  3 Units Subcutaneous TID WC   insulin glargine-yfgn  20 Units Subcutaneous QHS   multivitamin with minerals  1 tablet Oral Daily   nicotine  14 mg Transdermal Daily   [START ON 08/25/2022] predniSONE  50 mg Oral Q breakfast   sodium chloride flush  3 mL Intravenous Q12H   sulfamethoxazole-trimethoprim  1 tablet Oral Q M,W,F   acetaminophen **OR** acetaminophen, albuterol, bisacodyl, fentaNYL (SUBLIMAZE) injection, hydrALAZINE, ondansetron (ZOFRAN) IV, ondansetron **OR** [DISCONTINUED] ondansetron (ZOFRAN) IV, oxyCODONE, polyethylene glycol  Assessment/ Plan:  Catherine [name] is a 22 y.o. 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 Most recent urine with 5.6 grams of proteinuria on 07/13/2022. With hypoalbuminemia Prednisone 75m daily Continue PJP prophylaxis medications, TMP/SMX Will need ARB for renal preservation once blood pressure improved. Remains soft today Will order Furosemide 449mIV once  Acute pulmonary embolism: with hypoalbuminemia and nephrotic syndrome. Prescribed apixaban, ran out prior to admission. Patient will need continued anticoagulation.Apixaban restarted.    Hypotension: Continue supportive care.   Secondary Hyperparathyroidism: not currently on an activated vitamin D.    LOS: 2   2/22/202410:44 AM

## 2022-08-24 NOTE — Consult Note (Signed)
NAME: Catherine [name]  DOB: 2001/04/07  MRN: [id number]  Date/Time: 08/24/2022 1:32 PM  REQUESTING PROVIDER: dr Billie Ruddy Subjective:  REASON FOR CONSULT: cavitary lesions lung ?pt is a poor historian because she is tired and sleeping Chart reviewed, spoke to her as well Catherine [name] is a 22 y.o. 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, DM, HTN presented to the ED on 08/22/22 with shortness of breath of few days duration and pain upper back She was also having cough but clear sputum- no hemoptysis She works as a Energy manager in [employer] In the ED vitals  08/22/22  BP 104/63  Temp 98.2 F (36.8 C)  Pulse Rate 65  Resp 19  SpO2 93 %  O2 Flow Rate (L/min) 4 L/min (S)    Latest Reference Range & Units 08/22/22  WBC 4.0 - 10.5 K/uL 15.2 (H)  Hemoglobin 12.0 - 15.0 g/dL 15.6 (H)  HCT 36.0 - 46.0 % 47.4 (H)  Platelets 150 - 400 K/uL 300  Creatinine 0.44 - 1.00 mg/dL 1.32 (H)   CTA showed b/l PE with rt heart strain Multifocal bilateral UL predominant pulmonary nodules Betsy Pries many of which were cavitary Seen by Pulmonologist who ordered workup for infection, autoimmune panel for cavitary lesion as she is immune compromised due to tacrolimus She was seen by vascular surgeon and underwent mechanical thrombectomy She is also on broad spectrum antibiotics I am asked to see her for antibiotic management   Past Medical History:  Diagnosis Date   Diabetes mellitus without complication ([place])    Dyslipidemia    Hypoalbuminemia    Marijuana abuse    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   Nephrotic syndrome 08/23/2019   Tobacco dependence     Past Surgical History:  Procedure Laterality Date   PULMONARY THROMBECTOMY Bilateral 08/22/2022   Procedure: PULMONARY THROMBECTOMY;  Surgeon: Algernon Huxley, MD;  Location: [facility];  Service: Cardiovascular;  Laterality: Bilateral;    Social History   Socioeconomic History   Marital status: Single     Spouse name: Not on file   Number of children: Not on file   Years of education: Not on file   Highest education level: Not on file  Occupational History   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  Tobacco Use   Smoking status: Every Day    Types: E-cigarettes   Smokeless tobacco: Never  Vaping Use   Vaping Use: Never used  Substance and Sexual Activity   Alcohol use: Not Currently   Drug use: Yes    Types: Marijuana    Comment: daily use   Sexual activity: Not Currently  Other Topics Concern   Not on file  Social History Narrative   Not on file   Social Determinants of Health   Financial Resource Strain: Not on file  Food Insecurity: No Food Insecurity (08/22/2022)   Hunger Vital Sign    Worried About Running Out of Food in the Last Year: Never true    Ran Out of Food in the Last Year: Never true  Transportation Needs: No Transportation Needs (08/22/2022)   PRAPARE - Hydrologist (Medical): No    Lack of Transportation (Non-Medical): No  Physical Activity: Not on file  Stress: Not on file  Social Connections: Not on file  Intimate Partner Violence: Not At Risk (08/22/2022)   Humiliation, Afraid, Rape, and Kick questionnaire    Fear of Current or Ex-Partner: No    Emotionally  Abused: No    Physically Abused: No    Sexually Abused: No    History reviewed. No pertinent family history. No Known Allergies I? Current Facility-Administered Medications  Medication Dose Route Frequency Provider Last Rate Last Admin   acetaminophen (TYLENOL) tablet 650 mg  650 mg Oral Q6H PRN Algernon Huxley, MD   650 mg at 08/24/22 [id number]   Or   acetaminophen (TYLENOL) suppository 650 mg  650 mg Rectal Q6H PRN Algernon Huxley, MD       albuterol (PROVENTIL) (2.5 MG/3ML) 0.083% nebulizer solution 2.5 mg  2.5 mg Nebulization Q2H PRN Algernon Huxley, MD       apixaban Arne Cleveland) tablet 10 mg  10 mg Oral BID Delena Bali, RPH   10 mg at 08/24/22 [id number]   Followed by   Derrill Memo ON  08/30/2022] apixaban (ELIQUIS) tablet 5 mg  5 mg Oral BID Delena Bali, RPH       atorvastatin (LIPITOR) tablet 40 mg  40 mg Oral Daily Algernon Huxley, MD   40 mg at 08/24/22 0844   azithromycin (ZITHROMAX) 500 mg in sodium chloride 0.9 % 250 mL IVPB  500 mg Intravenous Q24H Algernon Huxley, MD 250 mL/hr at 08/24/22 0853 500 mg at 08/24/22 0853   bisacodyl (DULCOLAX) EC tablet 5 mg  5 mg Oral Daily PRN Algernon Huxley, MD       ceFEPIme (MAXIPIME) 2 g in sodium chloride 0.9 % 100 mL IVPB  2 g Intravenous Q12H Algernon Huxley, MD 200 mL/hr at 08/24/22 1220 2 g at 08/24/22 1220   docusate sodium (COLACE) capsule 100 mg  100 mg Oral BID Algernon Huxley, MD   100 mg at 08/24/22 0844   feeding supplement (GLUCERNA SHAKE) (GLUCERNA SHAKE) liquid 237 mL  237 mL Oral BID BM Enzo Bi, MD   237 mL at 08/24/22 0857   fentaNYL (SUBLIMAZE) injection 12.5 mcg  12.5 mcg Intravenous Once PRN Algernon Huxley, MD       hydrALAZINE (APRESOLINE) injection 5 mg  5 mg Intravenous Q4H PRN Algernon Huxley, MD       insulin aspart (novoLOG) injection 0-5 Units  0-5 Units Subcutaneous QHS Algernon Huxley, MD   5 Units at 08/23/22 2245   insulin aspart (novoLOG) injection 0-9 Units  0-9 Units Subcutaneous TID WC Algernon Huxley, MD   2 Units at 08/23/22 1618   insulin aspart (novoLOG) injection 3 Units  3 Units Subcutaneous TID WC Enzo Bi, MD   3 Units at 08/24/22 0845   insulin glargine-yfgn (SEMGLEE) injection 20 Units  20 Units Subcutaneous QHS Algernon Huxley, MD   20 Units at 08/23/22 2245   multivitamin with minerals tablet 1 tablet  1 tablet Oral Daily Enzo Bi, MD   1 tablet at 08/24/22 0843   nicotine (NICODERM CQ - dosed in mg/24 hours) patch 14 mg  14 mg Transdermal Daily Algernon Huxley, MD   14 mg at 08/23/22 0936   ondansetron (ZOFRAN) injection 4 mg  4 mg Intravenous Q6H PRN Algernon Huxley, MD       ondansetron (ZOFRAN) tablet 4 mg  4 mg Oral Q6H PRN Algernon Huxley, MD       oxyCODONE (Oxy IR/ROXICODONE) immediate release tablet 5 mg  5  mg Oral Q4H PRN Algernon Huxley, MD   5 mg at 08/23/22 1949   polyethylene glycol (MIRALAX / GLYCOLAX) packet 17 g  17 g  Oral Daily PRN Algernon Huxley, MD       Derrill Memo ON 08/25/2022] predniSONE (DELTASONE) tablet 50 mg  50 mg Oral Q breakfast Kolluru, Sarath, MD       sodium chloride flush (NS) 0.9 % injection 3 mL  3 mL Intravenous Q12H Algernon Huxley, MD   3 mL at 08/24/22 0845   sulfamethoxazole-trimethoprim (BACTRIM DS) 800-160 MG per tablet 1 tablet  1 tablet Oral Q M,W,F Algernon Huxley, MD   1 tablet at 08/23/22 [id number]     Abtx:  Anti-infectives (From admission, onward)    Start     Dose/Rate Route Frequency Ordered Stop   08/23/22 1400  vancomycin (VANCOREADY) IVPB 750 mg/150 mL  Status:  Discontinued        750 mg 150 mL/hr over 60 Minutes Intravenous Every 24 hours 08/22/22 1217 08/23/22 1135   08/23/22 1000  sulfamethoxazole-trimethoprim (BACTRIM DS) 800-160 MG per tablet 1 tablet        1 tablet Oral Every M-W-F 08/22/22 1258     08/23/22 0800  azithromycin (ZITHROMAX) 500 mg in sodium chloride 0.9 % 250 mL IVPB        500 mg 250 mL/hr over 60 Minutes Intravenous Every 24 hours 08/22/22 1200     08/23/22 0000  ceFEPIme (MAXIPIME) 2 g in sodium chloride 0.9 % 100 mL IVPB        2 g 200 mL/hr over 30 Minutes Intravenous Every 12 hours 08/22/22 1217     08/22/22 1245  ceFAZolin (ANCEF) IVPB 2g/100 mL premix  Status:  Discontinued        2 g 200 mL/hr over 30 Minutes Intravenous 30 min pre-op 08/22/22 1245 08/22/22 1736   08/22/22 1200  vancomycin (VANCOCIN) IVPB 1000 mg/200 mL premix        1,000 mg 200 mL/hr over 60 Minutes Intravenous  Once 08/22/22 1047 08/22/22 1849   08/22/22 1015  ceFEPIme (MAXIPIME) 2 g in sodium chloride 0.9 % 100 mL IVPB        2 g 200 mL/hr over 30 Minutes Intravenous  Once 08/22/22 1002 08/22/22 1248   08/22/22 0945  azithromycin (ZITHROMAX) 500 mg in sodium chloride 0.9 % 250 mL IVPB        500 mg 250 mL/hr over 60 Minutes Intravenous  Once 08/22/22 0944  08/22/22 1311       REVIEW OF SYSTEMS:  Const: no fever, negative chills, negative weight loss Eyes: negative diplopia or visual changes, negative eye pain ENT: negative coryza, negative sore throat Resp: +cough, no hemoptysis, ++ dyspnea Cards: negative for chest pain, palpitations, ++lower extremity edema GU: negative for frequency, dysuria and hematuria GI: Negative for abdominal pain, diarrhea, bleeding, constipation Skin: negative for rash and pruritus Heme: negative for easy bruising and gum/nose bleeding MS: upper back pain and muscle weakness Neurolo:negative for headaches, +dizziness, vertigo, memory problems  Psych: negative for feelings of anxiety, depression  Endocrine:  diabetes Allergy/Immunology- negative for any medication or food allergies ?  Objective:  VITALS:  BP 97/62 (BP Location: Left Arm)   Pulse 81   Temp 98.1 F (36.7 C)   Resp 16   Ht 4' 11"$  (1.499 m)   Wt 62 kg   SpO2 100%   BMI 27.61 kg/m   PHYSICAL EXAM:  General: somnolent, no distress at rest, oriented x5  Head: Normocephalic, without obvious abnormality, atraumatic. Eyes: Conjunctivae clear, anicteric sclerae. Pupils are equal ENT Nares normal. No drainage or sinus tenderness. Lips,  mucosa, and tongue normal. No Thrush Puffy face Neck: Supple, symmetrical, no adenopathy, thyroid: non tender no carotid bruit and no JVD. Back: No CVA tenderness. Lungs: b/l air entry- crepts bases Heart: Regular rate and rhythm, no murmur, rub or gallop. Abdomen: Soft, non-tender,not distended. Bowel sounds normal. No masses Extremities: edema legs Skin: No rashes or lesions. Or bruising Lymph: Cervical, supraclavicular normal. Neurologic: Grossly non-focal Pertinent Labs Lab Results CBC    Component Value Date/Time   WBC 16.4 (H) 08/24/2022 0522   RBC 3.50 (L) 08/24/2022 0522   HGB 9.8 (L) 08/24/2022 0522   HCT 29.4 (L) 08/24/2022 0522   PLT 284 08/24/2022 0522   MCV 84.0 08/24/2022 0522    MCH 28.0 08/24/2022 0522   MCHC 33.3 08/24/2022 0522   RDW 14.7 08/24/2022 0522   LYMPHSABS 4.7 (H) 08/22/2022 0708   MONOABS 1.0 08/22/2022 0708   EOSABS 0.2 08/22/2022 0708   BASOSABS 0.1 08/22/2022 0708       Latest Ref Rng & Units 08/24/2022    5:22 AM 08/23/2022   10:35 PM 08/23/2022    6:21 AM  CMP  Glucose 70 - 99 mg/dL 144  361  294   BUN 6 - 20 mg/dL 39   35   Creatinine 0.44 - 1.00 mg/dL 1.39   1.43   Sodium 135 - 145 mmol/L 133   128   Potassium 3.5 - 5.1 mmol/L 4.3   5.2   Chloride 98 - 111 mmol/L 108   105   CO2 22 - 32 mmol/L 20   20   Calcium 8.9 - 10.3 mg/dL 7.2   7.2   Total Protein 6.5 - 8.1 g/dL   4.0   Total Bilirubin 0.3 - 1.2 mg/dL   0.4   Alkaline Phos 38 - 126 U/L   117   AST 15 - 41 U/L   17   ALT 0 - 44 U/L   12       Microbiology: Recent Results (from the past 240 hour(s))  Resp panel by RT-PCR (RSV, Flu A&B, Covid) Anterior Nasal Swab     Status: None   Collection Time: 08/22/22  7:08 AM   Specimen: Anterior Nasal Swab  Result Value Ref Range Status   SARS Coronavirus 2 by RT PCR NEGATIVE NEGATIVE Final    Comment: (NOTE) SARS-CoV-2 target nucleic acids are NOT DETECTED.  The SARS-CoV-2 RNA is generally detectable in upper respiratory specimens during the acute phase of infection. The lowest concentration of SARS-CoV-2 viral copies this assay can detect is 138 copies/mL. A negative result does not preclude SARS-Cov-2 infection and should not be used as the sole basis for treatment or other patient management decisions. A negative result may occur with  improper specimen collection/handling, submission of specimen other than nasopharyngeal swab, presence of viral mutation(s) within the areas targeted by this assay, and inadequate number of viral copies(<138 copies/mL). A negative result must be combined with clinical observations, patient history, and epidemiological information. The expected result is Negative.  Fact Sheet for Patients:   EntrepreneurPulse.com.au  Fact Sheet for Healthcare Providers:  IncredibleEmployment.be  This test is no t yet approved or cleared by the [place] FDA and  has been authorized for detection and/or diagnosis of SARS-CoV-2 by FDA under an Emergency Use Authorization (EUA). This EUA will remain  in effect (meaning this test can be used) for the duration of the COVID-19 declaration under Section 564(b)(1) of the Act, 21 U.S.C.section 360bbb-3(b)(1), unless the authorization  is terminated  or revoked sooner.       Influenza A by PCR NEGATIVE NEGATIVE Final   Influenza B by PCR NEGATIVE NEGATIVE Final    Comment: (NOTE) The Xpert Xpress SARS-CoV-2/FLU/RSV plus assay is intended as an aid in the diagnosis of influenza from Nasopharyngeal swab specimens and should not be used as a sole basis for treatment. Nasal washings and aspirates are unacceptable for Xpert Xpress SARS-CoV-2/FLU/RSV testing.  Fact Sheet for Patients: EntrepreneurPulse.com.au  Fact Sheet for Healthcare Providers: IncredibleEmployment.be  This test is not yet approved or cleared by the [place] FDA and has been authorized for detection and/or diagnosis of SARS-CoV-2 by FDA under an Emergency Use Authorization (EUA). This EUA will remain in effect (meaning this test can be used) for the duration of the COVID-19 declaration under Section 564(b)(1) of the Act, 21 U.S.C. section 360bbb-3(b)(1), unless the authorization is terminated or revoked.     Resp Syncytial Virus by PCR NEGATIVE NEGATIVE Final    Comment: (NOTE) Fact Sheet for Patients: EntrepreneurPulse.com.au  Fact Sheet for Healthcare Providers: IncredibleEmployment.be  This test is not yet approved or cleared by the [place] FDA and has been authorized for detection and/or diagnosis of SARS-CoV-2 by FDA under an Emergency Use  Authorization (EUA). This EUA will remain in effect (meaning this test can be used) for the duration of the COVID-19 declaration under Section 564(b)(1) of the Act, 21 U.S.C. section 360bbb-3(b)(1), unless the authorization is terminated or revoked.  Performed at [facility], [address] [place], [place] [address]   Blood culture (routine x 2)     Status: None (Preliminary result)   Collection Time: 08/22/22  9:18 AM   Specimen: BLOOD  Result Value Ref Range Status   Specimen Description BLOOD BLOOD LEFT ARM  Final   Special Requests   Final    BOTTLES DRAWN AEROBIC AND ANAEROBIC Blood Culture results may not be optimal due to an inadequate volume of blood received in culture bottles   Culture   Final    NO GROWTH 2 DAYS Performed at [facility], [address], [place], [place] [address]    Report Status PENDING  Incomplete  Blood culture (routine x 2)     Status: None (Preliminary result)   Collection Time: 08/22/22 12:02 PM   Specimen: BLOOD RIGHT HAND  Result Value Ref Range Status   Specimen Description BLOOD RIGHT HAND  Final   Special Requests   Final    BOTTLES DRAWN AEROBIC ONLY Blood Culture results may not be optimal due to an inadequate volume of blood received in culture bottles   Culture   Final    NO GROWTH 2 DAYS Performed at [facility], [address] [place], [place] [address]    Report Status PENDING  Incomplete  MRSA Next Gen by PCR, Nasal     Status: None   Collection Time: 08/22/22 12:47 PM   Specimen: Nasal Mucosa; Nasal Swab  Result Value Ref Range Status   MRSA by PCR Next Gen NOT DETECTED NOT DETECTED Final    Comment: (NOTE) The GeneXpert MRSA Assay (FDA approved for NASAL specimens only), is one component of a comprehensive MRSA colonization surveillance program. It is not intended to diagnose MRSA infection nor to guide or monitor treatment for MRSA infections. Test performance is not FDA approved in  patients less than 68 years old. Performed at [facility], [address] [place],  [address]  IMAGING RESULTS:  I have personally reviewed the films ?b/l pulmonary cavitary lesion upper lobe rt > left  Impression/Recommendation 22 y.o. 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 Very likely these lesions are secondary to pulmonary infarction cavitation due to the emboli Septic emboli usually causes such cavitation but there is no bacteremia or endocarditis or lemierre's disease and 5-10% of PE can give rise to bland emboli leading to cavitation Because of her immune compromised state concern for opportunistic infections like Nocardia, PJP Beta D glucan and other tests are pending MRSA nares neg If she can give sputum we can send for culture She is on cefepime /azithromycin- can be DC and changed to Po augmentin for 2-4 weeks. If the cavitary lesions worsens or she has fever then she will need bronchoscopy/ she will have to follow up with pulmonary as OP ? Nephrotic syndrome with proteinuria puts her at risk for thrombosis- recommend checking antithrobin III and other factors like Protein C/S etc  Minimal change disease causing nephrotic syndrome- she has been non compliant with tacrolimus- now on high dose steroids with PJP prophylaxis?  DM-  On insulin  ___________________________________________________ Discussed with patient, requesting provider Note:  This document was prepared using Dragon voice recognition software and may include unintentional dictation errors.

## 2022-08-24 NOTE — Inpatient Diabetes Management (Signed)
Inpatient Diabetes Program Recommendations  AACE/ADA: New Consensus Statement on Inpatient Glycemic Control (2015)  Target Ranges:  Prepandial:   less than 140 mg/dL      Peak postprandial:   less than 180 mg/dL (1-2 hours)      Critically ill patients:  140 - 180 mg/dL    Latest Reference Range & Units 08/23/22 07:45 08/23/22 11:37 08/23/22 16:02 08/23/22 20:55  Glucose-Capillary 70 - 99 mg/dL 293 (H)  5 units Novolog  182 (H)  2 units Novolog  166 (H)  2 units Novolog  415 (H)  5 units Novolog  20 units Semglee  (H): Data is abnormally high  Latest Reference Range & Units 08/24/22 08:17 08/24/22 12:16  Glucose-Capillary 70 - 99 mg/dL 99  3 units Novolog  75       History: Type 1 Diabetes  Home DM Meds: Lantus 22 units QHS        Novolog 1 unit for every 8 grams Carbohydrates        Novolog 1 unit for eevery 50 mg/dl > Target CBG 150        See Endocrinology visit notes from 07/19/2022  Current Orders: Semglee 20 units QHS      Novolog Sensitive Correction Scale/ SSI (0-9 units) TID AC + HS      Novolog 3 units TID with meals     Solumedrol 125 mg Daily Stopped--Last dose given this AM  Will Start Prednisone 50 mg Daily tomorrow AM 2/23  Note improvement in CBGs will today  Will follow   --Will follow patient during hospitalization--  Wyn Quaker RN, MSN, [place] Diabetes Coordinator Inpatient Glycemic Control Team Team Pager: [phone] (8a-5p)

## 2022-08-24 NOTE — Progress Notes (Signed)
  Progress Note    08/24/2022 4:42 PM 2 Days Post-Op  Subjective:   This is a 22 y.o. female past medical history of diabetes, nephrotic syndrome who presents with back pain cough and dyspnea.  She has a history of DVT/PE. She was diagnosed with nephrotic syndrome at age 89. On/off prednisone, tacrolimus over the years. Relapsing/remitting. Not on a transplant list.    Patient is now postop day 2 from a pulmonary thrombectomy after being diagnosed with pulmonary embolism with large clot burden and right heart strain.  She is resting comfortably in bed this afternoon.  She is no longer wearing any oxygen.  Her oxygen saturation is 96 to 97% on the monitor.  Denies any chest pain or shortness of breath this afternoon.  Vitals all remained stable.  She has no other complaints.   Vitals:   08/24/22 0816 08/24/22 1627  BP: 97/62 100/61  Pulse: 81 94  Resp: 16 20  Temp: 98.1 F (36.7 C) 98 F (36.7 C)  SpO2: 100% (!) 87%   Physical Exam: Cardiac:  RRR Lungs: Normal nonlabored breathing, with rales in the bases and intermittent rhonchi. Incisions: Right groin with dressing intact remains clean and dry no hematoma or seroma to note Extremities: Right lower extremity with palpable PT pulse and warm to touch Abdomen: Positive bowel sounds, soft, flat, nontender and nondistended. Neurologic: AAOx3 and neuro intact.  Answers all questions appropriately.  CBC    Component Value Date/Time   WBC 16.4 (H) 08/24/2022 0522   RBC 3.50 (L) 08/24/2022 0522   HGB 9.8 (L) 08/24/2022 0522   HCT 29.4 (L) 08/24/2022 0522   PLT 284 08/24/2022 0522   MCV 84.0 08/24/2022 0522   MCH 28.0 08/24/2022 0522   MCHC 33.3 08/24/2022 0522   RDW 14.7 08/24/2022 0522   LYMPHSABS 4.7 (H) 08/22/2022 0708   MONOABS 1.0 08/22/2022 0708   EOSABS 0.2 08/22/2022 0708   BASOSABS 0.1 08/22/2022 0708    BMET    Component Value Date/Time   NA 133 (L) 08/24/2022 0522   K 4.3 08/24/2022 0522   CL 108 08/24/2022 0522    CO2 20 (L) 08/24/2022 0522   GLUCOSE 144 (H) 08/24/2022 0522   BUN 39 (H) 08/24/2022 0522   CREATININE 1.39 (H) 08/24/2022 0522   CALCIUM 7.2 (L) 08/24/2022 0522   GFRNONAA 55 (L) 08/24/2022 0522   GFRAA >60 10/07/2019 1509    INR    Component Value Date/Time   INR 1.4 (H) 08/22/2022 1115     Intake/Output Summary (Last 24 hours) at 08/24/2022 1642 Last data filed at 08/24/2022 1220 Gross per 24 hour  Intake 461 ml  Output 600 ml  Net -139 ml     Assessment/Plan:  22 y.o. female is s/p pulmonary thrombectomy for bilateral pulmonary embolisms.  2 Days Post-Op   PLAN: Patient started on Eliquis 10 mg by mouth twice daily for 7 days.  Post loading dose convert to 5 mg by mouth twice daily. Continue with pain control. Wean off oxygen as tolerated. Follow-up with vascular surgery in 1 month.  No studies needed. Vascular surgery will sign off this case today.  No other interventions planned at this time.  If needed again please reconsult we will be glad to see the patient.  DVT prophylaxis: Eliquis 10 mg by mouth twice daily.   Drema Pry [facility] 08/24/2022 4:42 PM

## 2022-08-25 LAB — PROTEIN ELECTROPHORESIS, SERUM
A/G Ratio: 0.2 — ABNORMAL LOW (ref 0.7–1.7)
Albumin ELP: 0.6 g/dL — ABNORMAL LOW (ref 2.9–4.4)
Alpha-1-Globulin: 0.1 g/dL (ref 0.0–0.4)
Alpha-2-Globulin: 1.7 g/dL — ABNORMAL HIGH (ref 0.4–1.0)
Beta Globulin: 0.6 g/dL — ABNORMAL LOW (ref 0.7–1.3)
Gamma Globulin: 0.4 g/dL (ref 0.4–1.8)
Globulin, Total: 2.9 g/dL (ref 2.2–3.9)
Total Protein ELP: 3.5 g/dL — ABNORMAL LOW (ref 6.0–8.5)

## 2022-08-25 LAB — CBC
HCT: 28.7 % — ABNORMAL LOW (ref 36.0–46.0)
Hemoglobin: 9.6 g/dL — ABNORMAL LOW (ref 12.0–15.0)
MCH: 28.5 pg (ref 26.0–34.0)
MCHC: 33.4 g/dL (ref 30.0–36.0)
MCV: 85.2 fL (ref 80.0–100.0)
Platelets: 285 10*3/uL (ref 150–400)
RBC: 3.37 MIL/uL — ABNORMAL LOW (ref 3.87–5.11)
RDW: 14.6 % (ref 11.5–15.5)
WBC: 15.4 10*3/uL — ABNORMAL HIGH (ref 4.0–10.5)
nRBC: 0 % (ref 0.0–0.2)

## 2022-08-25 LAB — BASIC METABOLIC PANEL
Anion gap: 6 (ref 5–15)
BUN: 42 mg/dL — ABNORMAL HIGH (ref 6–20)
CO2: 22 mmol/L (ref 22–32)
Calcium: 7.2 mg/dL — ABNORMAL LOW (ref 8.9–10.3)
Chloride: 103 mmol/L (ref 98–111)
Creatinine, Ser: 1.49 mg/dL — ABNORMAL HIGH (ref 0.44–1.00)
GFR, Estimated: 51 mL/min — ABNORMAL LOW (ref >60)
Glucose, Bld: 324 mg/dL — ABNORMAL HIGH (ref 70–99)
Potassium: 4.5 mmol/L (ref 3.5–5.1)
Sodium: 131 mmol/L — ABNORMAL LOW (ref 135–145)

## 2022-08-25 LAB — MAGNESIUM: Magnesium: 2 mg/dL (ref 1.7–2.4)

## 2022-08-25 LAB — ASPERGILLUS ANTIGEN, BAL/SERUM: Aspergillus Ag, BAL/Serum: 0.07 Index (ref 0.00–0.49)

## 2022-08-25 LAB — HEMOGLOBIN A1C
Hgb A1c MFr Bld: 9.1 % — ABNORMAL HIGH (ref 4.8–5.6)
Mean Plasma Glucose: 214.47 mg/dL

## 2022-08-25 LAB — GLUCOSE, CAPILLARY: Glucose-Capillary: 276 mg/dL — ABNORMAL HIGH (ref 70–99)

## 2022-08-25 LAB — PHOSPHORUS: Phosphorus: 3 mg/dL (ref 2.5–4.6)

## 2022-08-25 MED ORDER — GLUCERNA SHAKE PO LIQD: 237.0000 mL | Freq: Two times a day (BID) | ORAL | 0 refills | Status: DC

## 2022-08-25 MED ORDER — PREDNISONE 50 MG PO TABS: 50.0000 mg | ORAL_TABLET | Freq: Every day | ORAL | 0 refills | Status: AC

## 2022-08-25 MED ORDER — APIXABAN (ELIQUIS) VTE STARTER PACK (10MG AND 5MG): ORAL_TABLET | ORAL | 0 refills | Status: DC

## 2022-08-25 MED ORDER — SULFAMETHOXAZOLE-TRIMETHOPRIM 800-160 MG PO TABS: 1.0000 | ORAL_TABLET | ORAL | 2 refills | Status: DC

## 2022-08-25 MED ORDER — ADULT MULTIVITAMIN W/MINERALS CH: 1.0000 | ORAL_TABLET | Freq: Every day | ORAL | Status: DC

## 2022-08-25 MED ORDER — NICOTINE 14 MG/24HR TD PT24: 14.0000 mg | MEDICATED_PATCH | Freq: Every day | TRANSDERMAL | 0 refills | Status: DC

## 2022-08-25 MED ORDER — APIXABAN 5 MG PO TABS: 5.0000 mg | ORAL_TABLET | Freq: Two times a day (BID) | ORAL | 2 refills | Status: DC

## 2022-08-25 MED ORDER — AMOXICILLIN-POT CLAVULANATE 875-125 MG PO TABS: 1.0000 | ORAL_TABLET | Freq: Two times a day (BID) | ORAL | 0 refills | Status: DC

## 2022-08-25 MED ORDER — TACROLIMUS 1 MG PO CAPS: 2.0000 mg | ORAL_CAPSULE | Freq: Two times a day (BID) | ORAL | 2 refills | Status: DC

## 2022-08-25 MED ORDER — TORSEMIDE 20 MG PO TABS: 20.0000 mg | ORAL_TABLET | Freq: Every day | ORAL | 0 refills | Status: AC

## 2022-08-25 NOTE — TOC Transition Note (Signed)
Transition of Care [facility]) - CM/SW Discharge Note   Patient Details  Name: Catherine [name] MRN: [id number] Date of Birth: 05/17/2001  Transition of Care [facility]) CM/SW Contact:  Gerilyn Pilgrim, LCSW Phone Number: 08/25/2022, 9:47 AM   Clinical Narrative:   CSW spoke with pt regarding readmission screen. Pt reports she lives with her grandfather and feels she will be safe to go back. She reports she goes to [facility] and sees a MD there. She had an appointment a few months ago. Pt states that she is currently working at [employer] and has no transportation barriers. Pt spoke to about her Elliquis cost and pt feels she is able to afford and pick it up at discharge. Pt reports that she would like her medications sent to [facility] in [place]. Pt reports no additional needs and has orders in to discharge.      Final next level of care: Home/Self Care Barriers to Discharge: Barriers Resolved   Patient Goals and CMS Choice      Discharge Placement                         Discharge Plan and Services Additional resources added to the After Visit Summary for                                       Social Determinants of Health (SDOH) Interventions SDOH Screenings   Food Insecurity: No Food Insecurity (08/22/2022)  Housing: Low Risk  (08/22/2022)  Transportation Needs: No Transportation Needs (08/22/2022)  Utilities: Not At Risk (08/22/2022)  Tobacco Use: High Risk (08/23/2022)     Readmission Risk Interventions    08/25/2022    9:46 AM 09/27/2021    4:09 PM  Readmission Risk Prevention Plan  Transportation Screening Complete Complete  PCP or Specialist Appt within 3-5 Days  Complete  HRI or [place]  Complete  Social Work Consult for [place] Planning/Counseling  Complete  Palliative Care Screening  Not Applicable  Medication Review Press photographer) Complete Referral to Pharmacy  PCP or Specialist appointment within 3-5 days of  discharge Complete   HRI or [place] Complete   SW Recovery Care/Counseling Consult Complete   [place] Not Applicable

## 2022-08-25 NOTE — Progress Notes (Signed)
[facility]  ROUNDING NOTE   Subjective:   Catherine [name] was admitted to [facility] on 08/22/2022 for SOB (shortness of breath) [R06.02] Pulmonary embolism with acute cor pulmonale ([place]) [I26.09]  Patient presents with her mother who assists with history taking. Patient started a new job yesterday. Patient presents with shortness of breath. She was found to have acute pulmonary embolism. Seems that she ran out of her apixaban.   Patient was admitted to [facility] from 12/29 to 1/3 with acute flare of minimal change disease with acute kidney injury. She was discharged on prednisone and tacrolimus.   Patient seen sitting at side of bed Alert and oriented Denies pain or discomfort Denies nausea or vomiting  Scheduled for discharge.  Objective:  Vital signs in last 24 hours:  Temp:  [97.9 F (36.6 C)-98 F (36.7 C)] 98 F (36.7 C) (02/23 0729) Pulse Rate:  [83-101] 83 (02/23 0729) Resp:  [18-20] 18 (02/23 0729) BP: (93-107)/(61-68) 96/68 (02/23 0729) SpO2:  [95 %-99 %] 96 % (02/23 0729) Weight:  [64.4 kg] 64.4 kg (02/23 0301)  Weight change: 2.4 kg Filed Weights   08/22/22 1243 08/24/22 0456 08/25/22 0301  Weight: 54.5 kg 62 kg 64.4 kg    Intake/Output: I/O last 3 completed shifts: In: [id number] [P.O.:358; I.V.:3; IV Piggyback:100] Out: 950 [Urine:950]   Intake/Output this shift:  Total I/O In: 240 [P.O.:240] Out: 800 [Urine:800]  Physical Exam: General: NAD  Head: Normocephalic, atraumatic. Moist oral mucosal membranes, facial edema  Eyes: Anicteric  Lungs:  Clear to auscultation  Heart: Regular rate and rhythm  Abdomen:  Soft, nontender  Extremities:  no peripheral edema.  Neurologic: Nonfocal, moving all four extremities  Skin: No lesions        Basic Metabolic Panel: Recent Labs  Lab 08/22/22 0708 08/23/22 0621 08/23/22 2235 08/24/22 0522 08/25/22 0404  NA 132* 128*  --  133* 131*  K 4.2 5.2*  --  4.3 4.5  CL 102 105  --  108 103  CO2 23 20*   --  20* 22  GLUCOSE 224* 294* 361* 144* 324*  BUN 32* 35*  --  39* 42*  CREATININE 1.32* 1.43*  --  1.39* 1.49*  CALCIUM 7.9* 7.2*  --  7.2* 7.2*  MG  --  1.8  --  2.0 2.0  PHOS  --  4.3  --  4.1 3.0     Liver Function Tests: Recent Labs  Lab 08/22/22 0708 08/23/22 0621  AST 19 17  ALT 17 12  ALKPHOS 185* 117  BILITOT 0.3 0.4  PROT 5.6* 4.0*  ALBUMIN <1.5* <1.5*    No results for input(s): "LIPASE", "AMYLASE" in the last 168 hours. No results for input(s): "AMMONIA" in the last 168 hours.  CBC: Recent Labs  Lab 08/22/22 0708 08/23/22 0621 08/24/22 0522 08/25/22 0404  WBC 15.2* 26.1* 16.4* 15.4*  NEUTROABS 9.2*  --   --   --   HGB 15.6* 9.6* 9.8* 9.6*  HCT 47.4* 29.0* 29.4* 28.7*  MCV 84.6 84.5 84.0 85.2  PLT 300 293 284 285     Cardiac Enzymes: No results for input(s): "CKTOTAL", "CKMB", "CKMBINDEX", "TROPONINI" in the last 168 hours.  BNP: Invalid input(s): "POCBNP"  CBG: Recent Labs  Lab 08/24/22 0817 08/24/22 1216 08/24/22 1556 08/24/22 2103 08/25/22 0729  GLUCAP 99 75 301* 312* 276*     Microbiology: Results for orders placed or performed during the hospital encounter of 08/22/22  Resp panel by RT-PCR (RSV,  Flu A&B, Covid) Anterior Nasal Swab     Status: None   Collection Time: 08/22/22  7:08 AM   Specimen: Anterior Nasal Swab  Result Value Ref Range Status   SARS Coronavirus 2 by RT PCR NEGATIVE NEGATIVE Final    Comment: (NOTE) SARS-CoV-2 target nucleic acids are NOT DETECTED.  The SARS-CoV-2 RNA is generally detectable in upper respiratory specimens during the acute phase of infection. The lowest concentration of SARS-CoV-2 viral copies this assay can detect is 138 copies/mL. A negative result does not preclude SARS-Cov-2 infection and should not be used as the sole basis for treatment or other patient management decisions. A negative result may occur with  improper specimen collection/handling, submission of specimen other than  nasopharyngeal swab, presence of viral mutation(s) within the areas targeted by this assay, and inadequate number of viral copies(<138 copies/mL). A negative result must be combined with clinical observations, patient history, and epidemiological information. The expected result is Negative.  Fact Sheet for Patients:  EntrepreneurPulse.com.au  Fact Sheet for Healthcare Providers:  IncredibleEmployment.be  This test is no t yet approved or cleared by the [place] FDA and  has been authorized for detection and/or diagnosis of SARS-CoV-2 by FDA under an Emergency Use Authorization (EUA). This EUA will remain  in effect (meaning this test can be used) for the duration of the COVID-19 declaration under Section 564(b)(1) of the Act, 21 U.S.C.section 360bbb-3(b)(1), unless the authorization is terminated  or revoked sooner.       Influenza A by PCR NEGATIVE NEGATIVE Final   Influenza B by PCR NEGATIVE NEGATIVE Final    Comment: (NOTE) The Xpert Xpress SARS-CoV-2/FLU/RSV plus assay is intended as an aid in the diagnosis of influenza from Nasopharyngeal swab specimens and should not be used as a sole basis for treatment. Nasal washings and aspirates are unacceptable for Xpert Xpress SARS-CoV-2/FLU/RSV testing.  Fact Sheet for Patients: EntrepreneurPulse.com.au  Fact Sheet for Healthcare Providers: IncredibleEmployment.be  This test is not yet approved or cleared by the [place] FDA and has been authorized for detection and/or diagnosis of SARS-CoV-2 by FDA under an Emergency Use Authorization (EUA). This EUA will remain in effect (meaning this test can be used) for the duration of the COVID-19 declaration under Section 564(b)(1) of the Act, 21 U.S.C. section 360bbb-3(b)(1), unless the authorization is terminated or revoked.     Resp Syncytial Virus by PCR NEGATIVE NEGATIVE Final    Comment:  (NOTE) Fact Sheet for Patients: EntrepreneurPulse.com.au  Fact Sheet for Healthcare Providers: IncredibleEmployment.be  This test is not yet approved or cleared by the [place] FDA and has been authorized for detection and/or diagnosis of SARS-CoV-2 by FDA under an Emergency Use Authorization (EUA). This EUA will remain in effect (meaning this test can be used) for the duration of the COVID-19 declaration under Section 564(b)(1) of the Act, 21 U.S.C. section 360bbb-3(b)(1), unless the authorization is terminated or revoked.  Performed at [facility], [address] [place], [place] [address]   Blood culture (routine x 2)     Status: None (Preliminary result)   Collection Time: 08/22/22  9:18 AM   Specimen: BLOOD  Result Value Ref Range Status   Specimen Description BLOOD BLOOD LEFT ARM  Final   Special Requests   Final    BOTTLES DRAWN AEROBIC AND ANAEROBIC Blood Culture results may not be optimal due to an inadequate volume of blood received in culture bottles   Culture   Final    NO GROWTH  3 DAYS Performed at [facility], [address] [place], [place] [address]    Report Status PENDING  Incomplete  Blood culture (routine x 2)     Status: None (Preliminary result)   Collection Time: 08/22/22 12:02 PM   Specimen: BLOOD RIGHT HAND  Result Value Ref Range Status   Specimen Description BLOOD RIGHT HAND  Final   Special Requests   Final    BOTTLES DRAWN AEROBIC ONLY Blood Culture results may not be optimal due to an inadequate volume of blood received in culture bottles   Culture   Final    NO GROWTH 3 DAYS Performed at [facility], [address] [place], [place] [address]    Report Status PENDING  Incomplete  MRSA Next Gen by PCR, Nasal     Status: None   Collection Time: 08/22/22 12:47 PM   Specimen: Nasal Mucosa; Nasal Swab  Result Value Ref Range Status   MRSA by PCR Next Gen NOT DETECTED NOT  DETECTED Final    Comment: (NOTE) The GeneXpert MRSA Assay (FDA approved for NASAL specimens only), is one component of a comprehensive MRSA colonization surveillance program. It is not intended to diagnose MRSA infection nor to guide or monitor treatment for MRSA infections. Test performance is not FDA approved in patients less than 13 years old. Performed at [facility], [address] [place],  [address]   Aspergillus Ag, BAL/Serum     Status: None   Collection Time: 08/23/22  6:21 AM   Specimen: Vein; Blood  Result Value Ref Range Status   Aspergillus Ag, BAL/Serum 0.07 0.00 - 0.49 Index Final    Comment: (NOTE) Performed At: [facility] [address] [place], [place] [id number] Rush Farmer MD [id number]     Coagulation Studies: No results for input(s): "LABPROT", "INR" in the last 72 hours.   Urinalysis: No results for input(s): "COLORURINE", "LABSPEC", "PHURINE", "GLUCOSEU", "HGBUR", "BILIRUBINUR", "KETONESUR", "PROTEINUR", "UROBILINOGEN", "NITRITE", "LEUKOCYTESUR" in the last 72 hours.  Invalid input(s): "APPERANCEUR"     Imaging: No results found.   Medications:      amoxicillin-clavulanate  1 tablet Oral Q12H   apixaban  10 mg Oral BID   Followed by   Derrill Memo ON 08/30/2022] apixaban  5 mg Oral BID   atorvastatin  40 mg Oral Daily   docusate sodium  100 mg Oral BID   feeding supplement (GLUCERNA SHAKE)  237 mL Oral BID BM   insulin aspart  0-5 Units Subcutaneous QHS   insulin aspart  0-9 Units Subcutaneous TID WC   insulin aspart  3 Units Subcutaneous TID WC   insulin glargine-yfgn  20 Units Subcutaneous QHS   multivitamin with minerals  1 tablet Oral Daily   nicotine  14 mg Transdermal Daily   predniSONE  50 mg Oral Q breakfast   sodium chloride flush  3 mL Intravenous Q12H   sulfamethoxazole-trimethoprim  1 tablet Oral Q M,W,F   acetaminophen **OR** acetaminophen, albuterol, bisacodyl, fentaNYL (SUBLIMAZE) injection,  hydrALAZINE, ondansetron (ZOFRAN) IV, ondansetron **OR** [DISCONTINUED] ondansetron (ZOFRAN) IV, oxyCODONE, polyethylene glycol  Assessment/ Plan:  Catherine [name] is a 22 y.o. 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 Most recent urine with 5.6 grams of proteinuria on 07/13/2022. With  hypoalbuminemia Cleared to discharge from renal stance Continue Prednisone and Tacrolimus at discharge. Will follow up with nephrology outpatient.   Acute pulmonary embolism: with hypoalbuminemia and nephrotic syndrome. Prescribed apixaban, ran out prior to admission. Continue Epixaban.   Hypotension: Continue supportive care.   Secondary Hyperparathyroidism: not currently on an activated vitamin D.    LOS: 3   2/23/202412:26 PM

## 2022-08-25 NOTE — Discharge Summary (Signed)
Physician Discharge Summary   Catherine [name]  female DOB: April 26, 2001  [id number]  PCP: [facility] [place] date: 08/22/2022 Discharge date: 08/25/2022  Admitted From: home Disposition:  home Mother updated at bedside prior to discharge. CODE STATUS: Full code  Discharge Instructions     Diet Carb Modified   Complete by: As directed    Discharge instructions   Complete by: As directed    You have large blood clots in your lungs, so you need to take blood thinner Eliquis indefinitely.  After you finish the first month starter pack, then continue taking Eliquis 5 mg twice daily.  Blood clots likely caused cavitary lesions in your lungs.  Please take Augmentin for 4 weeks to help prevent infection.  For your kidney disease, nephrologist would like you to take prednisone 50 mg daily until you follow up with them in outpatient clinic 1-2 weeks after discharge.  Continue taking tacrolimus and Bactrim (infection prevention) as directed.  Please follow up with pulmonology Dr. Genia Harold 2 weeks after discharge.     Dr. Enzo Bi [place] Hospital Course:  For full details, please see H&P, progress notes, consult notes and ancillary notes.  Briefly,  Catherine [name] is a 22 y.o. female with medical history significant of DM and nephrotic/minimal change syndrome, remote h/o collapsed lung, required chest tube and Life Flight admission to [facility], presenting with SOB.      Pt was diagnosed with nephrotic syndrome at age 5.  On/off prednisone, tacrolimus over the years. Relapsing/remitting.  Not on a transplant list.   Pulmonary embolism with R heart strain S/p pulmonary thrombectomy on 2/20 -Patient without prior episodes of thromboembolic disease presenting with new submassive PE -She was hospitalized at [facility] in January and was discharged on Eliquis due to her significant hypoalbuminemia; she ran out of the Eliquis and did not obtain refills --started on  heparin gtt, transitioned to Eliquis -Smoking cessation has been strongly encouraged by admitting physician- both tobacco and marijuana -Based on her severely low albumin she is at high risk (>8%/year) for recurrent VTE and should be maintained on indefinite AC. -The patient understands that thromboembolic disease can be catastrophic and even deadly and that she must be complaint with physician appointments and anticoagulation. --Follow-up with vascular surgery in 1 month.    Cavity lung masses --Procal elevated.  Pt was started on Cefepime, Azithromycin and Vanc on admission, vanc d/c'ed after MRSA screen neg. --ID consulted and deemed likely these lesions are secondary to pulmonary infarction cavitation due to the emboli. --pt was discharged on Augmentin for 4 weeks for ppx and empiric tx. --outpatient f/u with pulm Dr. Genia Harold on 09/12/22.   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  -Renal function appears to be stable at this time after her recent flare; she was hospitalized at [facility] from 12/29-1/3 and treated with high-dose prednisone (supposed to be continued for at least 4 weeks) and tacrolimus -Intermittent takes tacrolimus and prednisone.  Prescribed Bactrim 3x/week but not taking. --received IVF --nephrology consulted.  Started on stress dose steroid, received IV solumedrol 125 mg daily for 3 days f/b prednisone 50 mg daily.  Ppx Bactrim resumed.  Tacrolimus resumed.  Home torsemide held during hospitalization, resumed at discharge, per nephrology rec. --cont prednisone 50 mg daily after discharge until outpatient nephrology f/u with Dr. Candiss Norse 1-2 weeks after discharge. -Also  with apparent hyperdensity focus of the kidney on imaging; likely needs a dedicated CT A/P once more stable as outpatient.   Uncontrolled DM1 -Poorly controlled.  A1c 9.1 --discharged back on home regimen  as below, to be followed up by outpatient provider for further adjustment.    HLD -Continue atorvastatin   Tobacco abuse -Cessation encouraged   Marijuana abuse -Cessation encouraged; this should be encouraged on an ongoing basis  Unless noted above, medications under "STOP" list are ones pt was not taking PTA.  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 The score is based on dignosis, age, lab data, medications, orders, and past utilization.   Low:  0-14.9   Medium: 15-21.9   High: 22-29.9   Extreme: 30 and above         Discharge Instructions:  Allergies as of 08/25/2022   No Known Allergies      Medication List     STOP taking these medications    acetaminophen 325 MG tablet Commonly known as: TYLENOL       TAKE these medications    amoxicillin-clavulanate 875-125 MG tablet Commonly known as: AUGMENTIN Take 1 tablet by mouth every 12 (twelve) hours for 28 days.   Apixaban Starter Pack ('10mg'$  and '5mg'$ ) Commonly known as: ELIQUIS STARTER PACK Take as directed on package: start with two-'5mg'$  tablets twice daily for 7 days. On day 8, switch to one-'5mg'$  tablet twice daily. What changed: You were already taking a medication with the same name, and this prescription was added. Make sure you understand how and when to take each.    apixaban 5 MG Tabs tablet Commonly known as: ELIQUIS Take 1 tablet (5 mg total) by mouth 2 (two) times daily. Start taking on: September 23, 2022 What changed: These instructions start on September 23, 2022. If you are unsure what to do until then, ask your doctor or other care provider.   atorvastatin 40 MG tablet Commonly known as: LIPITOR Take 1 tablet (40 mg total) by mouth daily.   blood glucose meter kit and supplies Dispense based on patient and insurance preference. Use up to four times daily as directed. (FOR ICD-10 E10.9, E11.9).   feeding supplement (GLUCERNA SHAKE) Liqd Take 237 mLs by mouth 2 (two) times daily between meals.   glucagon 1 MG injection Inject 1 mg into the muscle as needed.   insulin aspart 100 UNIT/ML injection Commonly known as: novoLOG Inject 5-8 Units into the skin 3 (three) times daily before meals.   Lantus 100 UNIT/ML injection Generic drug: insulin glargine Inject 0.3 mLs (30 Units total) into the skin at bedtime.   multivitamin with minerals Tabs tablet Take 1 tablet by mouth daily.   nicotine 14 mg/24hr patch Commonly known as: NICODERM CQ - dosed in mg/24 hours Place 1 patch (14 mg total) onto the skin daily.  predniSONE 50 MG tablet Commonly known as: DELTASONE Take 1 tablet (50 mg total) by mouth daily with breakfast.   sulfamethoxazole-trimethoprim 800-160 MG tablet Commonly known as: BACTRIM DS Take 1 tablet by mouth every Monday, Wednesday, and Friday. What changed: when to take this   tacrolimus 1 MG capsule Commonly known as: PROGRAF Take 2 capsules (2 mg total) by mouth 2 (two) times daily.   torsemide 20 MG tablet Commonly known as: DEMADEX Take 1 tablet (20 mg total) by mouth daily.   Vitamin D (Ergocalciferol) 1.25 MG (50000 UNIT) Caps capsule Commonly known as: DRISDOL Take 50,000 Units by mouth every 7 (seven) days.         Follow-up Information     Murlean Iba, MD. Go on 09/11/2022.   Specialty:  Nephrology Why: @ 1:20pm Contact information: [place] [address] [phone]         Armando Reichert, MD. Go on 09/12/2022.   Specialty: Pulmonary Disease Why: @ 2pm Contact information: [place] [address] [phone]                 No Known Allergies   The results of significant diagnostics from this hospitalization (including imaging, microbiology, ancillary and laboratory) are listed below for reference.   Consultations:   Procedures/Studies: US Venous Img Lower Bilateral (DVT)  Result Date: 08/23/2022 CLINICAL DATA:  Acute pulmonary emboli EXAM: BILATERAL LOWER EXTREMITY VENOUS DOPPLER ULTRASOUND TECHNIQUE: Gray-scale sonography with graded compression, as well as color Doppler and duplex ultrasound were performed to evaluate the lower extremity deep venous systems from the level of the common femoral vein and including the common femoral, femoral, profunda femoral, popliteal and calf veins including the posterior tibial, peroneal and gastrocnemius veins when visible. Spectral Doppler was utilized to evaluate flow at rest and with distal augmentation maneuvers in the common femoral, femoral and popliteal veins. COMPARISON:  None Available. FINDINGS: RIGHT LOWER EXTREMITY Common Femoral Vein: No evidence of thrombus. Normal compressibility, respiratory phasicity and response to augmentation. Saphenofemoral Junction: No evidence of thrombus. Normal compressibility and flow on color Doppler imaging. Profunda Femoral Vein: No evidence of thrombus. Normal compressibility and flow on color Doppler imaging. Femoral Vein: No evidence of thrombus. Normal compressibility, respiratory phasicity and response to augmentation. Popliteal Vein: No evidence of thrombus. Normal compressibility, respiratory phasicity and response to augmentation. Calf Veins: No evidence of thrombus. Normal compressibility and flow on color  Doppler imaging. Other: Superficial thrombosis noted of the proximal calf small saphenous vein. No propagation into the deep venous system. LEFT LOWER EXTREMITY Common Femoral Vein: No evidence of thrombus. Normal compressibility, respiratory phasicity and response to augmentation. Saphenofemoral Junction: No evidence of thrombus. Normal compressibility and flow on color Doppler imaging. Profunda Femoral Vein: No evidence of thrombus. Normal compressibility and flow on color Doppler imaging. Femoral Vein: No evidence of thrombus. Normal compressibility, respiratory phasicity and response to augmentation. Popliteal Vein: No evidence of thrombus. Normal compressibility, respiratory phasicity and response to augmentation. Calf Veins: No evidence of thrombus. Normal compressibility and flow on color Doppler imaging. IMPRESSION: No evidence of deep venous thrombosis in either lower extremity. Right proximal calf small saphenous vein superficial thrombosis (very minor) Electronically Signed   By: Jerilynn Mages.  Shick M.D.   On: 08/23/2022 09:48   PERIPHERAL VASCULAR CATHETERIZATION  Result Date: 08/22/2022 See surgical note for result.  ECHOCARDIOGRAM COMPLETE  Result Date: 08/22/2022    ECHOCARDIOGRAM REPORT   Patient Name:   Janera G  [facility] Date of Exam: 08/22/2022 Medical Rec #:  [id number]           Height:       59.0 in Accession #:    [id number]          Weight:       120.1 lb Date of Birth:  Aug 18, 2000           BSA:          1.485 m Patient Age:    21 years            BP:           90/42 mmHg Patient Gender: F                   HR:           103 bpm. Exam Location:  [facility] Procedure: 2D Echo, Color Doppler and Cardiac Doppler STAT ECHO Indications:     Pulmonary embolus I26.09  History:         Patient has prior history of Echocardiogram examinations, most                  recent 09/27/2021. Risk Factors:Diabetes and Dyslipidemia.                  Tobacco dependence.  Sonographer:     Sherrie Sport Referring Phys:  [employer] Diagnosing Phys: Nelva Bush MD IMPRESSIONS  1. Left ventricular ejection fraction, by estimation, is 60 to 65%. The left ventricle has normal function. The left ventricle has no regional wall motion abnormalities. There is mild left ventricular hypertrophy. Left ventricular diastolic parameters are consistent with Grade I diastolic dysfunction (impaired relaxation). There is the interventricular septum is flattened in systole and diastole, consistent with right ventricular pressure and volume overload.  2. Right ventricular systolic function demonstrates moderate hypokinesis of the free wall with sparing of the apex consistent with acute cor pulmonale (McConnell's sign). The right ventricular size is moderately enlarged. Moderately increased right ventricular wall thickness. There is severely elevated pulmonary artery systolic pressure. The estimated right ventricular systolic pressure is 123XX123 mmHg.  3. A small pericardial effusion is present.  4. The mitral valve is normal in structure. No evidence of mitral valve regurgitation. No evidence of mitral stenosis.  5. Tricuspid valve regurgitation is moderate to severe.  6. The aortic valve is tricuspid. Aortic valve regurgitation is not visualized. No aortic stenosis is present.  7. The inferior vena cava is normal in size with greater than 50% respiratory variability, suggesting right atrial pressure of 3 mmHg. FINDINGS  Left Ventricle: Left ventricular ejection fraction, by estimation, is 60 to 65%. The left ventricle has normal function. The left ventricle has no regional wall motion abnormalities. The left ventricular internal cavity size was normal in size. There is  mild left ventricular hypertrophy. The interventricular septum is flattened in systole and diastole, consistent with right ventricular pressure and volume overload. Left ventricular diastolic parameters are consistent with Grade I diastolic dysfunction (impaired relaxation). Right  Ventricle: The right ventricular size is moderately enlarged. Moderately increased right ventricular wall thickness. Right ventricular systolic function demonstrates moderate hypokinesis of the free wall with sparing of the apex consistent with acute cor pulmonale (McConnell's sign). There is severely elevated pulmonary artery systolic pressure. The tricuspid regurgitant velocity is 4.78 m/s, and with an assumed right atrial pressure of 3 mmHg, the estimated right ventricular systolic pressure is 123XX123 mmHg. Left Atrium: Left atrial size  was normal in size. Right Atrium: Right atrial size was normal in size. Pericardium: A small pericardial effusion is present. Mitral Valve: The mitral valve is normal in structure. No evidence of mitral valve regurgitation. No evidence of mitral valve stenosis. Tricuspid Valve: The tricuspid valve is normal in structure. Tricuspid valve regurgitation is moderate to severe. Aortic Valve: The aortic valve is tricuspid. Aortic valve regurgitation is not visualized. No aortic stenosis is present. Aortic valve mean gradient measures 2.0 mmHg. Aortic valve peak gradient measures 3.1 mmHg. Aortic valve area, by VTI measures 2.33 cm. Pulmonic Valve: The pulmonic valve was not well visualized. Pulmonic valve regurgitation is mild. No evidence of pulmonic stenosis. Aorta: The aortic root is normal in size and structure. Pulmonary Artery: The pulmonary artery is not well seen. Venous: The inferior vena cava is normal in size with greater than 50% respiratory variability, suggesting right atrial pressure of 3 mmHg. IAS/Shunts: The interatrial septum was not well visualized.  LEFT VENTRICLE PLAX 2D LVIDd:         2.30 cm   Diastology LVIDs:         1.50 cm   LV e' medial:    5.55 cm/s LV PW:         1.20 cm   LV E/e' medial:  12.8 LV IVS:        1.20 cm   LV e' lateral:   10.60 cm/s LVOT diam:     1.80 cm   LV E/e' lateral: 6.7 LV SV:         25 LV SV Index:   17 LVOT Area:     2.54 cm  RIGHT  VENTRICLE RV Basal diam:  3.90 cm RV Mid diam:    3.25 cm RV S prime:     11.90 cm/s TAPSE (M-mode): 1.7 cm LEFT ATRIUM             Index       RIGHT ATRIUM           Index LA diam:        1.10 cm 0.74 cm/m  RA Area:     15.00 cm LA Vol (A2C):   10.6 ml 7.14 ml/m  RA Volume:   42.00 ml  28.28 ml/m LA Vol (A4C):   9.7 ml  6.54 ml/m LA Biplane Vol: 10.7 ml 7.20 ml/m  AORTIC VALVE                    PULMONIC VALVE AV Area (Vmax):    2.01 cm     PR End Diast Vel: 25.00 msec AV Area (Vmean):   1.97 cm AV Area (VTI):     2.33 cm AV Vmax:           88.20 cm/s AV Vmean:          60.900 cm/s AV VTI:            0.107 m AV Peak Grad:      3.1 mmHg AV Mean Grad:      2.0 mmHg LVOT Vmax:         69.50 cm/s LVOT Vmean:        47.100 cm/s LVOT VTI:          0.098 m LVOT/AV VTI ratio: 0.92  AORTA Ao Root diam: 2.10 cm MITRAL VALVE                TRICUSPID VALVE MV Area (PHT): 6.07 cm  TR Peak grad:   91.4 mmHg MV Decel Time: 125 msec     TR Vmax:        478.00 cm/s MV E velocity: 71.30 cm/s MV A velocity: 113.00 cm/s  SHUNTS MV E/A ratio:  0.63         Systemic VTI:  0.10 m                             Systemic Diam: 1.80 cm Nelva Bush MD Electronically signed by Nelva Bush MD Signature Date/Time: 08/22/2022/2:28:41 PM    Final    CT Angio Chest PE W and/or Wo Contrast  Result Date: 08/22/2022 CLINICAL DATA:  Concern for pulmonary embolus. EXAM: CT ANGIOGRAPHY CHEST WITH CONTRAST TECHNIQUE: Multidetector CT imaging of the chest was performed using the standard protocol during bolus administration of intravenous contrast. Multiplanar CT image reconstructions and MIPs were obtained to evaluate the vascular anatomy. RADIATION DOSE REDUCTION: This exam was performed according to the departmental dose-optimization program which includes automated exposure control, adjustment of the mA and/or kV according to patient size and/or use of iterative reconstruction technique. CONTRAST:  67m OMNIPAQUE IOHEXOL 350 MG/ML  SOLN COMPARISON:  CT abdomen and pelvis September 26, 2021. FINDINGS: Cardiovascular: Filling defects within the right main pulmonary artery extending into multiple segmental and subsegmental branches predominantly involving the right lower and middle lobes. As well there are multiple left-sided segmental and subsegmental pulmonary artery filling defects. Right heart enlargement with a right ventricular to left ventricular ratio of 1.9. Normal caliber thoracic aorta. Mediastinum/Nodes: No suspicious thyroid nodule. No pathologically enlarged mediastinal or hilar lymph nodes. The esophagus is grossly unremarkable. Lungs/Pleura: Multifocal bilateral upper lobe predominant pulmonary nodules/masses many of which are cavitary. For reference: -cavitary right upper lobe pulmonary mass measures 3.4 x 2.0 cm on image 54/8 -cavitary left upper lobe pulmonary nodule measures 10 mm on image 32/8. Bronchiectasis with clustered nodularity in the dependent bilateral lower lobes. Multifocal ground-glass opacities with subpleural sparing may reflect pulmonary hemorrhage or an infectious or inflammatory etiology. No pleural effusion.  No pneumothorax. Upper Abdomen: Reflux of contrast into the IVC and hepatic veins. Hyperdensity vs focus of hyperenhancement in the right upper pole kidney on image 121/6. Musculoskeletal: No acute osseous abnormality. Review of the MIP images confirms the above findings. IMPRESSION: 1. Acute bilateral pulmonary emboli with CT evidence of right heart strain (RV/LV Ratio = 1.9 ) consistent with at least submassive (intermediate risk) PE. The presence of right heart strain has been associated with an increased risk of morbidity and mortality. Please refer to the "Code PE Focused" order set in EPIC. 2. Multifocal bilateral upper lobe predominant pulmonary nodules/masses many of which are cavitary. Findings are favored to reflect an atypical infectious process such as septic emboli. 3. Multifocal ground-glass  opacities with subpleural sparing likely reflects a combination of pulmonary hemorrhage and/or an infectious or inflammatory etiology. 4. Hyperdensity vs focus of hyperenhancement in the right upper pole kidney, nonspecific suggest further evaluation with CT of the abdomen and pelvis upon stabilization of patient. Critical Value/emergent results were called by telephone at the time of interpretation on 08/22/2022 at 9:15 am to provider Dr IBland Span who verbally acknowledged these results. Electronically Signed   By: JDahlia BailiffM.D.   On: 08/22/2022 09:15   DG Chest Portable 1 View  Result Date: 08/22/2022 CLINICAL DATA:  22year old female with history of shortness of breath. EXAM: PORTABLE CHEST 1 VIEW COMPARISON:  Chest x-ray 09/27/2021. FINDINGS: Widespread areas of interstitial prominence and architectural distortion are noted throughout the mid to lower lungs bilaterally (right greater than left), similar to the prior study from 09/26/2021, presumably areas of chronic post infectious or inflammatory scarring. No pleural effusions. No pneumothorax. No evidence of pulmonary edema. Heart size is normal. Upper mediastinal contours are within normal limits. IMPRESSION: 1. Grossly abnormal chest x-ray with findings which appear to be chronic based on comparison with prior chest radiographs. Lung bases were grossly abnormal on prior CT of the abdomen and pelvis 09/26/2021. Further evaluation with high-resolution chest CT is suggested to better delineate these findings which are very abnormal in a 22 year old patient. Electronically Signed   By: Vinnie Langton M.D.   On: 08/22/2022 06:58      Labs: BNP (last 3 results) Recent Labs    08/22/22 0708  BNP 0000000*   Basic Metabolic Panel: Recent Labs  Lab 08/22/22 0708 08/23/22 0621 08/23/22 2235 08/24/22 0522 08/25/22 0404  NA 132* 128*  --  133* 131*  K 4.2 5.2*  --  4.3 4.5  CL 102 105  --  108 103  CO2 23 20*  --  20* 22  GLUCOSE 224* 294*  361* 144* 324*  BUN 32* 35*  --  39* 42*  CREATININE 1.32* 1.43*  --  1.39* 1.49*  CALCIUM 7.9* 7.2*  --  7.2* 7.2*  MG  --  1.8  --  2.0 2.0  PHOS  --  4.3  --  4.1 3.0   Liver Function Tests: Recent Labs  Lab 08/22/22 0708 08/23/22 0621  AST 19 17  ALT 17 12  ALKPHOS 185* 117  BILITOT 0.3 0.4  PROT 5.6* 4.0*  ALBUMIN <1.5* <1.5*   No results for input(s): "LIPASE", "AMYLASE" in the last 168 hours. No results for input(s): "AMMONIA" in the last 168 hours. CBC: Recent Labs  Lab 08/22/22 0708 08/23/22 0621 08/24/22 0522 08/25/22 0404  WBC 15.2* 26.1* 16.4* 15.4*  NEUTROABS 9.2*  --   --   --   HGB 15.6* 9.6* 9.8* 9.6*  HCT 47.4* 29.0* 29.4* 28.7*  MCV 84.6 84.5 84.0 85.2  PLT 300 293 284 285   Cardiac Enzymes: No results for input(s): "CKTOTAL", "CKMB", "CKMBINDEX", "TROPONINI" in the last 168 hours. BNP: Invalid input(s): "POCBNP" CBG: Recent Labs  Lab 08/24/22 0817 08/24/22 1216 08/24/22 1556 08/24/22 2103 08/25/22 0729  GLUCAP 99 75 301* 312* 276*   D-Dimer No results for input(s): "DDIMER" in the last 72 hours. Hgb A1c Recent Labs    08/25/22 0404  HGBA1C 9.1*   Lipid Profile No results for input(s): "CHOL", "HDL", "LDLCALC", "TRIG", "CHOLHDL", "LDLDIRECT" in the last 72 hours. Thyroid function studies No results for input(s): "TSH", "T4TOTAL", "T3FREE", "THYROIDAB" in the last 72 hours.  Invalid input(s): "FREET3" Anemia work up No results for input(s): "VITAMINB12", "FOLATE", "FERRITIN", "TIBC", "IRON", "RETICCTPCT" in the last 72 hours. Urinalysis    Component Value Date/Time   COLORURINE AMBER (A) 08/22/2022 0709   APPEARANCEUR CLOUDY (A) 08/22/2022 0709   LABSPEC >1.046 (H) 08/22/2022 0709   PHURINE 5.0 08/22/2022 0709   GLUCOSEU 150 (A) 08/22/2022 0709   HGBUR SMALL (A) 08/22/2022 0709   BILIRUBINUR NEGATIVE 08/22/2022 0709   KETONESUR NEGATIVE 08/22/2022 0709   PROTEINUR >=300 (A) 08/22/2022 0709   NITRITE NEGATIVE 08/22/2022 0709    LEUKOCYTESUR NEGATIVE 08/22/2022 0709   Sepsis Labs Recent Labs  Lab 08/22/22 0708 08/23/22 [id number] 08/24/22 0522 08/25/22 0404  WBC 15.2* 26.1* 16.4* 15.4*   Microbiology Recent Results (from the past 240 hour(s))  Resp panel by RT-PCR (RSV, Flu A&B, Covid) Anterior Nasal Swab     Status: None   Collection Time: 08/22/22  7:08 AM   Specimen: Anterior Nasal Swab  Result Value Ref Range Status   SARS Coronavirus 2 by RT PCR NEGATIVE NEGATIVE Final    Comment: (NOTE) SARS-CoV-2 target nucleic acids are NOT DETECTED.  The SARS-CoV-2 RNA is generally detectable in upper respiratory specimens during the acute phase of infection. The lowest concentration of SARS-CoV-2 viral copies this assay can detect is 138 copies/mL. A negative result does not preclude SARS-Cov-2 infection and should not be used as the sole basis for treatment or other patient management decisions. A negative result may occur with  improper specimen collection/handling, submission of specimen other than nasopharyngeal swab, presence of viral mutation(s) within the areas targeted by this assay, and inadequate number of viral copies(<138 copies/mL). A negative result must be combined with clinical observations, patient history, and epidemiological information. The expected result is Negative.  Fact Sheet for Patients:  EntrepreneurPulse.com.au  Fact Sheet for Healthcare Providers:  IncredibleEmployment.be  This test is no t yet approved or cleared by the [place] FDA and  has been authorized for detection and/or diagnosis of SARS-CoV-2 by FDA under an Emergency Use Authorization (EUA). This EUA will remain  in effect (meaning this test can be used) for the duration of the COVID-19 declaration under Section 564(b)(1) of the Act, 21 U.S.C.section 360bbb-3(b)(1), unless the authorization is terminated  or revoked sooner.       Influenza A by PCR NEGATIVE NEGATIVE Final    Influenza B by PCR NEGATIVE NEGATIVE Final    Comment: (NOTE) The Xpert Xpress SARS-CoV-2/FLU/RSV plus assay is intended as an aid in the diagnosis of influenza from Nasopharyngeal swab specimens and should not be used as a sole basis for treatment. Nasal washings and aspirates are unacceptable for Xpert Xpress SARS-CoV-2/FLU/RSV testing.  Fact Sheet for Patients: EntrepreneurPulse.com.au  Fact Sheet for Healthcare Providers: IncredibleEmployment.be  This test is not yet approved or cleared by the [place] FDA and has been authorized for detection and/or diagnosis of SARS-CoV-2 by FDA under an Emergency Use Authorization (EUA). This EUA will remain in effect (meaning this test can be used) for the duration of the COVID-19 declaration under Section 564(b)(1) of the Act, 21 U.S.C. section 360bbb-3(b)(1), unless the authorization is terminated or revoked.     Resp Syncytial Virus by PCR NEGATIVE NEGATIVE Final    Comment: (NOTE) Fact Sheet for Patients: EntrepreneurPulse.com.au  Fact Sheet for Healthcare Providers: IncredibleEmployment.be  This test is not yet approved or cleared by the [place] FDA and has been authorized for detection and/or diagnosis of SARS-CoV-2 by FDA under an Emergency Use Authorization (EUA). This EUA will remain in effect (meaning this test can be used) for the duration of the COVID-19 declaration under Section 564(b)(1) of the Act, 21 U.S.C. section 360bbb-3(b)(1), unless the authorization is terminated or revoked.  Performed at [facility], [address] [place], [place] [address]   Blood culture (routine x 2)     Status: None (Preliminary result)   Collection Time: 08/22/22  9:18 AM   Specimen: BLOOD  Result Value Ref Range Status   Specimen Description BLOOD BLOOD LEFT ARM  Final   Special Requests   Final    BOTTLES DRAWN AEROBIC AND ANAEROBIC  Blood Culture results may not be optimal due  to an inadequate volume of blood received in culture bottles   Culture   Final    NO GROWTH 3 DAYS Performed at [facility], [address] [place], [place] [address]    Report Status PENDING  Incomplete  Blood culture (routine x 2)     Status: None (Preliminary result)   Collection Time: 08/22/22 12:02 PM   Specimen: BLOOD RIGHT HAND  Result Value Ref Range Status   Specimen Description BLOOD RIGHT HAND  Final   Special Requests   Final    BOTTLES DRAWN AEROBIC ONLY Blood Culture results may not be optimal due to an inadequate volume of blood received in culture bottles   Culture   Final    NO GROWTH 3 DAYS Performed at [facility], [address] [place], [place] [address]    Report Status PENDING  Incomplete  MRSA Next Gen by PCR, Nasal     Status: None   Collection Time: 08/22/22 12:47 PM   Specimen: Nasal Mucosa; Nasal Swab  Result Value Ref Range Status   MRSA by PCR Next Gen NOT DETECTED NOT DETECTED Final    Comment: (NOTE) The GeneXpert MRSA Assay (FDA approved for NASAL specimens only), is one component of a comprehensive MRSA colonization surveillance program. It is not intended to diagnose MRSA infection nor to guide or monitor treatment for MRSA infections. Test performance is not FDA approved in patients less than 42 years old. Performed at [facility], [address] [place], [place] [address]   Aspergillus Ag, BAL/Serum     Status: None   Collection Time: 08/23/22  6:21 AM   Specimen: Vein; Blood  Result Value Ref Range Status   Aspergillus Ag, BAL/Serum 0.07 0.00 - 0.49 Index Final    Comment: (NOTE) Performed At: [facility] [address] [place], [place] [id number] Rush Farmer MD [id number]      Total time spend on discharging this patient, including the last patient exam, discussing the hospital stay, instructions for ongoing care as it relates to all  pertinent caregivers, as well as preparing the medical discharge records, prescriptions, and/or referrals as applicable, is 45 minutes.    Enzo Bi, MD  [facility] 08/25/2022, 10:54 AM

## 2022-08-25 NOTE — Progress Notes (Signed)
Patient being discharged home. Removed PIV. Went over discharge instructions and medications with patients mother. Patient also received work note. Patients mother stated that she understood and all questions were answered. Patient going home POV with mother.

## 2022-08-26 LAB — QUANTIFERON-TB GOLD PLUS (RQFGPL)
QuantiFERON Mitogen Value: 0.18 IU/mL
QuantiFERON Nil Value: 0.02 IU/mL
QuantiFERON TB1 Ag Value: 0.01 IU/mL
QuantiFERON TB2 Ag Value: 0.03 IU/mL

## 2022-08-26 LAB — QUANTIFERON-TB GOLD PLUS: QuantiFERON-TB Gold Plus: UNDETERMINED — AB

## 2022-08-27 LAB — CULTURE, BLOOD (ROUTINE X 2)
Culture: NO GROWTH
Culture: NO GROWTH

## 2022-08-28 LAB — FUNGITELL BETA-D-GLUCAN
Fungitell Value:: 64.84 pg/mL
Result Name:: POSITIVE — AB

## 2022-08-29 LAB — HISTOPLASMA ANTIGEN, URINE: Histoplasma Antigen, urine: NEGATIVE (ref <0.5)

## 2022-08-30 ENCOUNTER — Emergency Department: Payer: Medicaid Other

## 2022-08-30 ENCOUNTER — Encounter: Payer: Self-pay | Admitting: Emergency Medicine

## 2022-08-30 ENCOUNTER — Other Ambulatory Visit: Payer: Self-pay

## 2022-08-30 ENCOUNTER — Inpatient Hospital Stay
Admission: EM | Admit: 2022-08-30 | Discharge: 2022-09-05 | DRG: 175 | Disposition: A | Discharge status: Home / Self Care | Payer: Medicaid Other | Attending: Internal Medicine | Admitting: Internal Medicine

## 2022-08-30 DIAGNOSIS — Z794 Long term (current) use of insulin: Secondary | ICD-10-CM | POA: Diagnosis not present

## 2022-08-30 DIAGNOSIS — I2694 Multiple subsegmental pulmonary emboli without acute cor pulmonale: Secondary | ICD-10-CM

## 2022-08-30 DIAGNOSIS — E162 Hypoglycemia, unspecified: Secondary | ICD-10-CM

## 2022-08-30 DIAGNOSIS — D6859 Other primary thrombophilia: Secondary | ICD-10-CM | POA: Diagnosis present

## 2022-08-30 DIAGNOSIS — Z7952 Long term (current) use of systemic steroids: Secondary | ICD-10-CM | POA: Diagnosis not present

## 2022-08-30 DIAGNOSIS — F1210 Cannabis abuse, uncomplicated: Secondary | ICD-10-CM | POA: Diagnosis present

## 2022-08-30 DIAGNOSIS — J9601 Acute respiratory failure with hypoxia: Secondary | ICD-10-CM

## 2022-08-30 DIAGNOSIS — E8809 Other disorders of plasma-protein metabolism, not elsewhere classified: Secondary | ICD-10-CM

## 2022-08-30 DIAGNOSIS — N1831 Chronic kidney disease, stage 3a: Secondary | ICD-10-CM | POA: Diagnosis present

## 2022-08-30 DIAGNOSIS — N049 Nephrotic syndrome with unspecified morphologic changes: Secondary | ICD-10-CM | POA: Diagnosis not present

## 2022-08-30 DIAGNOSIS — I2699 Other pulmonary embolism without acute cor pulmonale: Secondary | ICD-10-CM

## 2022-08-30 DIAGNOSIS — E785 Hyperlipidemia, unspecified: Secondary | ICD-10-CM | POA: Diagnosis present

## 2022-08-30 DIAGNOSIS — N2581 Secondary hyperparathyroidism of renal origin: Secondary | ICD-10-CM | POA: Diagnosis present

## 2022-08-30 DIAGNOSIS — E1021 Type 1 diabetes mellitus with diabetic nephropathy: Secondary | ICD-10-CM | POA: Diagnosis not present

## 2022-08-30 DIAGNOSIS — F17200 Nicotine dependence, unspecified, uncomplicated: Secondary | ICD-10-CM | POA: Diagnosis present

## 2022-08-30 DIAGNOSIS — J984 Other disorders of lung: Secondary | ICD-10-CM | POA: Diagnosis not present

## 2022-08-30 DIAGNOSIS — Z79899 Other long term (current) drug therapy: Secondary | ICD-10-CM

## 2022-08-30 DIAGNOSIS — E1069 Type 1 diabetes mellitus with other specified complication: Secondary | ICD-10-CM | POA: Diagnosis present

## 2022-08-30 DIAGNOSIS — Z79621 Long term (current) use of calcineurin inhibitor: Secondary | ICD-10-CM

## 2022-08-30 DIAGNOSIS — R079 Chest pain, unspecified: Secondary | ICD-10-CM | POA: Diagnosis not present

## 2022-08-30 DIAGNOSIS — D849 Immunodeficiency, unspecified: Secondary | ICD-10-CM | POA: Diagnosis present

## 2022-08-30 DIAGNOSIS — E1022 Type 1 diabetes mellitus with diabetic chronic kidney disease: Secondary | ICD-10-CM | POA: Diagnosis present

## 2022-08-30 DIAGNOSIS — E1029 Type 1 diabetes mellitus with other diabetic kidney complication: Secondary | ICD-10-CM | POA: Diagnosis present

## 2022-08-30 DIAGNOSIS — E10649 Type 1 diabetes mellitus with hypoglycemia without coma: Secondary | ICD-10-CM | POA: Diagnosis not present

## 2022-08-30 DIAGNOSIS — Z7901 Long term (current) use of anticoagulants: Secondary | ICD-10-CM

## 2022-08-30 DIAGNOSIS — J189 Pneumonia, unspecified organism: Secondary | ICD-10-CM

## 2022-08-30 DIAGNOSIS — N050 Unspecified nephritic syndrome with minor glomerular abnormality: Secondary | ICD-10-CM

## 2022-08-30 DIAGNOSIS — R918 Other nonspecific abnormal finding of lung field: Secondary | ICD-10-CM | POA: Diagnosis present

## 2022-08-30 DIAGNOSIS — T451X6A Underdosing of antineoplastic and immunosuppressive drugs, initial encounter: Secondary | ICD-10-CM | POA: Diagnosis present

## 2022-08-30 DIAGNOSIS — I129 Hypertensive chronic kidney disease with stage 1 through stage 4 chronic kidney disease, or unspecified chronic kidney disease: Secondary | ICD-10-CM | POA: Diagnosis present

## 2022-08-30 DIAGNOSIS — I10 Essential (primary) hypertension: Secondary | ICD-10-CM | POA: Diagnosis present

## 2022-08-30 DIAGNOSIS — I2693 Single subsegmental pulmonary embolism without acute cor pulmonale: Principal | ICD-10-CM | POA: Diagnosis present

## 2022-08-30 DIAGNOSIS — F17290 Nicotine dependence, other tobacco product, uncomplicated: Secondary | ICD-10-CM | POA: Diagnosis present

## 2022-08-30 DIAGNOSIS — Z87441 Personal history of nephrotic syndrome: Secondary | ICD-10-CM

## 2022-08-30 DIAGNOSIS — Z792 Long term (current) use of antibiotics: Secondary | ICD-10-CM | POA: Diagnosis not present

## 2022-08-30 DIAGNOSIS — D72829 Elevated white blood cell count, unspecified: Secondary | ICD-10-CM | POA: Insufficient documentation

## 2022-08-30 DIAGNOSIS — E875 Hyperkalemia: Secondary | ICD-10-CM | POA: Diagnosis not present

## 2022-08-30 HISTORY — DX: Other pulmonary embolism without acute cor pulmonale: I26.99

## 2022-08-30 LAB — GLUCOSE, CAPILLARY
Glucose-Capillary: 154 mg/dL — ABNORMAL HIGH (ref 70–99)
Glucose-Capillary: 145 mg/dL — ABNORMAL HIGH (ref 70–99)
Glucose-Capillary: 270 mg/dL — ABNORMAL HIGH (ref 70–99)

## 2022-08-30 LAB — TROPONIN I (HIGH SENSITIVITY)
Troponin I (High Sensitivity): 17 ng/L (ref <18)
Troponin I (High Sensitivity): 22 ng/L — ABNORMAL HIGH (ref <18)

## 2022-08-30 LAB — CBC
HCT: 32.8 % — ABNORMAL LOW (ref 36.0–46.0)
Hemoglobin: 10.4 g/dL — ABNORMAL LOW (ref 12.0–15.0)
MCH: 28.1 pg (ref 26.0–34.0)
MCHC: 31.7 g/dL (ref 30.0–36.0)
MCV: 88.6 fL (ref 80.0–100.0)
Platelets: 518 10*3/uL — ABNORMAL HIGH (ref 150–400)
RBC: 3.7 MIL/uL — ABNORMAL LOW (ref 3.87–5.11)
RDW: 14.7 % (ref 11.5–15.5)
WBC: 24.2 10*3/uL — ABNORMAL HIGH (ref 4.0–10.5)
nRBC: 0.1 % (ref 0.0–0.2)

## 2022-08-30 LAB — BASIC METABOLIC PANEL
Anion gap: 10 (ref 5–15)
BUN: 29 mg/dL — ABNORMAL HIGH (ref 6–20)
CO2: 19 mmol/L — ABNORMAL LOW (ref 22–32)
Calcium: 7 mg/dL — ABNORMAL LOW (ref 8.9–10.3)
Chloride: 107 mmol/L (ref 98–111)
Creatinine, Ser: 1.09 mg/dL — ABNORMAL HIGH (ref 0.44–1.00)
GFR, Estimated: >60 mL/min (ref >60)
Glucose, Bld: 53 mg/dL — ABNORMAL LOW (ref 70–99)
Potassium: 3.6 mmol/L (ref 3.5–5.1)
Sodium: 136 mmol/L (ref 135–145)

## 2022-08-30 LAB — PROTIME-INR
INR: 1.4 — ABNORMAL HIGH (ref 0.8–1.2)
Prothrombin Time: 17.1 seconds — ABNORMAL HIGH (ref 11.4–15.2)

## 2022-08-30 LAB — CBG MONITORING, ED
Glucose-Capillary: 186 mg/dL — ABNORMAL HIGH (ref 70–99)
Glucose-Capillary: 39 mg/dL — CL (ref 70–99)

## 2022-08-30 LAB — MRSA NEXT GEN BY PCR, NASAL: MRSA by PCR Next Gen: NOT DETECTED

## 2022-08-30 LAB — APTT
aPTT: 33 seconds (ref 24–36)
aPTT: 84 seconds — ABNORMAL HIGH (ref 24–36)
aPTT: 53 seconds — ABNORMAL HIGH (ref 24–36)

## 2022-08-30 LAB — LACTIC ACID, PLASMA
Lactic Acid, Venous: 2.3 mmol/L (ref 0.5–1.9)
Lactic Acid, Venous: 1.4 mmol/L (ref 0.5–1.9)

## 2022-08-30 LAB — BRAIN NATRIURETIC PEPTIDE: B Natriuretic Peptide: 317.4 pg/mL — ABNORMAL HIGH (ref 0.0–100.0)

## 2022-08-30 LAB — HEPARIN LEVEL (UNFRACTIONATED): Heparin Unfractionated: >1.1 IU/mL — ABNORMAL HIGH (ref 0.30–0.70)

## 2022-08-30 LAB — PROCALCITONIN: Procalcitonin: <0.1 ng/mL

## 2022-08-30 MED ORDER — INSULIN ASPART 100 UNIT/ML IJ SOLN
0.0000 [IU] | Freq: Every day | INTRAMUSCULAR | Status: DC
  Administered 2022-08-30 – 2022-08-31 (×2): 3 [IU] via SUBCUTANEOUS
  Administered 2022-09-01: 4 [IU] via SUBCUTANEOUS
  Administered 2022-09-02: 3 [IU] via SUBCUTANEOUS
  Administered 2022-09-04: 2 [IU] via SUBCUTANEOUS
  Filled 2022-08-30 (×5): qty 1

## 2022-08-30 MED ORDER — HYDROCOD POLI-CHLORPHE POLI ER 10-8 MG/5ML PO SUER: 5.0000 mL | Freq: Two times a day (BID) | ORAL | 0 refills | Status: DC | PRN

## 2022-08-30 MED ORDER — HEPARIN BOLUS VIA INFUSION
4000.0000 [IU] | Freq: Once | INTRAVENOUS | Status: AC
  Administered 2022-08-30: 4000 [IU] via INTRAVENOUS
  Filled 2022-08-30: qty 4000

## 2022-08-30 MED ORDER — BENZONATATE 100 MG PO CAPS: 100.0000 mg | ORAL_CAPSULE | Freq: Three times a day (TID) | ORAL | 1 refills | Status: DC | PRN

## 2022-08-30 MED ORDER — ONDANSETRON HCL 4 MG/2ML IJ SOLN
4.0000 mg | INTRAMUSCULAR | Status: AC
  Administered 2022-08-30: 4 mg via INTRAVENOUS
  Filled 2022-08-30: qty 2

## 2022-08-30 MED ORDER — INSULIN ASPART 100 UNIT/ML IJ SOLN
0.0000 [IU] | Freq: Three times a day (TID) | INTRAMUSCULAR | Status: DC
  Administered 2022-08-31: 3 [IU] via SUBCUTANEOUS
  Administered 2022-08-31: 2 [IU] via SUBCUTANEOUS
  Administered 2022-08-31: 3 [IU] via SUBCUTANEOUS
  Administered 2022-09-01: 9 [IU] via SUBCUTANEOUS
  Administered 2022-09-01: 7 [IU] via SUBCUTANEOUS
  Administered 2022-09-01: 3 [IU] via SUBCUTANEOUS
  Administered 2022-09-02: 2 [IU] via SUBCUTANEOUS
  Administered 2022-09-02: 7 [IU] via SUBCUTANEOUS
  Administered 2022-09-02: 2 [IU] via SUBCUTANEOUS
  Administered 2022-09-03: 5 [IU] via SUBCUTANEOUS
  Administered 2022-09-03: 1 [IU] via SUBCUTANEOUS
  Administered 2022-09-03: 3 [IU] via SUBCUTANEOUS
  Administered 2022-09-04 (×2): 2 [IU] via SUBCUTANEOUS
  Administered 2022-09-05: 3 [IU] via SUBCUTANEOUS
  Administered 2022-09-05: 2 [IU] via SUBCUTANEOUS
  Filled 2022-08-30 (×13): qty 1

## 2022-08-30 MED ORDER — MORPHINE SULFATE (PF) 4 MG/ML IV SOLN
4.0000 mg | Freq: Once | INTRAVENOUS | Status: AC
  Administered 2022-08-30: 4 mg via INTRAVENOUS
  Filled 2022-08-30: qty 1

## 2022-08-30 MED ORDER — BUDESONIDE 0.5 MG/2ML IN SUSP
0.5000 mg | Freq: Two times a day (BID) | RESPIRATORY_TRACT | Status: DC
  Administered 2022-08-30 – 2022-09-05 (×12): 0.5 mg via RESPIRATORY_TRACT
  Filled 2022-08-30 (×12): qty 2

## 2022-08-30 MED ORDER — INSULIN ASPART 100 UNIT/ML IJ SOLN
3.0000 [IU] | Freq: Three times a day (TID) | INTRAMUSCULAR | Status: DC
  Administered 2022-08-31 – 2022-09-05 (×16): 3 [IU] via SUBCUTANEOUS
  Filled 2022-08-30 (×13): qty 1

## 2022-08-30 MED ORDER — SODIUM CHLORIDE 0.9 % IV BOLUS
500.0000 mL | Freq: Once | INTRAVENOUS | Status: AC
  Administered 2022-08-30: 500 mL via INTRAVENOUS

## 2022-08-30 MED ORDER — FENTANYL CITRATE PF 50 MCG/ML IJ SOSY
50.0000 ug | PREFILLED_SYRINGE | Freq: Once | INTRAMUSCULAR | Status: AC
  Administered 2022-08-30: 50 ug via INTRAVENOUS
  Filled 2022-08-30: qty 1

## 2022-08-30 MED ORDER — PIPERACILLIN-TAZOBACTAM 3.375 G IVPB
3.3750 g | Freq: Three times a day (TID) | INTRAVENOUS | Status: DC
  Administered 2022-08-30 – 2022-09-02 (×8): 3.375 g via INTRAVENOUS
  Filled 2022-08-30 (×8): qty 50

## 2022-08-30 MED ORDER — IOHEXOL 350 MG/ML SOLN
75.0000 mL | Freq: Once | INTRAVENOUS | Status: AC | PRN
  Administered 2022-08-30: 48 mL via INTRAVENOUS

## 2022-08-30 MED ORDER — IPRATROPIUM-ALBUTEROL 0.5-2.5 (3) MG/3ML IN SOLN
3.0000 mL | RESPIRATORY_TRACT | Status: DC
  Administered 2022-08-30 – 2022-09-01 (×9): 3 mL via RESPIRATORY_TRACT
  Filled 2022-08-30 (×8): qty 3

## 2022-08-30 MED ORDER — HYDROMORPHONE HCL 1 MG/ML IJ SOLN
0.5000 mg | INTRAMUSCULAR | Status: DC | PRN
  Administered 2022-08-30 – 2022-09-02 (×10): 0.5 mg via INTRAVENOUS
  Filled 2022-08-30 (×10): qty 1

## 2022-08-30 MED ORDER — HEPARIN (PORCINE) 25000 UT/250ML-% IV SOLN
1400.0000 [IU]/h | INTRAVENOUS | Status: DC
  Administered 2022-08-30: 1000 [IU]/h via INTRAVENOUS
  Administered 2022-09-01 – 2022-09-02 (×2): 1300 [IU]/h via INTRAVENOUS
  Administered 2022-09-04: 1400 [IU]/h via INTRAVENOUS
  Filled 2022-08-30 (×7): qty 250

## 2022-08-30 MED ORDER — HEPARIN BOLUS VIA INFUSION
1550.0000 [IU] | Freq: Once | INTRAVENOUS | Status: AC
  Administered 2022-08-30: 1550 [IU] via INTRAVENOUS
  Filled 2022-08-30: qty 1550

## 2022-08-30 MED ORDER — DEXTROSE 50 % IV SOLN
1.0000 | Freq: Once | INTRAVENOUS | Status: AC
  Administered 2022-08-30: 50 mL via INTRAVENOUS
  Filled 2022-08-30: qty 50

## 2022-08-30 MED ORDER — BENZONATATE 100 MG PO CAPS
200.0000 mg | ORAL_CAPSULE | Freq: Three times a day (TID) | ORAL | Status: DC | PRN
  Administered 2022-08-30 – 2022-09-03 (×4): 200 mg via ORAL
  Filled 2022-08-30 (×4): qty 2

## 2022-08-30 MED ORDER — HYDROCOD POLI-CHLORPHE POLI ER 10-8 MG/5ML PO SUER
5.0000 mL | Freq: Two times a day (BID) | ORAL | Status: DC | PRN
  Administered 2022-09-03 – 2022-09-04 (×3): 5 mL via ORAL
  Filled 2022-08-30 (×3): qty 5

## 2022-08-30 NOTE — Assessment & Plan Note (Signed)
Recently evaluated by nephrology during recent admission Started on prednisone 50 mg as well as prophylactic Bactrim and tacrolimus Will continue home medications for now Does have some minimal to mild generalized edema on exam Continue torsemide Nephrology consult as clinically indicated

## 2022-08-30 NOTE — H&P (Addendum)
History and Physical    Patient: Catherine [name] [id number] DOB: 13-Apr-2001 DOA: 08/30/2022 DOS: the patient was seen and examined on 08/30/2022 PCP: [facility]  Patient coming from: Home  Chief Complaint:  Chief Complaint  Patient presents with   Chest Pain   HPI: Catherine [name] is a 22 y.o. female with medical history significant of multiple medical issues including type 1 diabetes poorly controlled, hypoalbuminemia, stage III CKD, nephrotic syndrome, tobacco abuse, marijuana abuse presenting with recurrent pulmonary embolus with infarcts.  Patient noted to have been admitted February 20 through 23 for similar issues with noted pulmonary embolus with right heart strain that required thrombectomy by Dr. Lucky Cowboy on February 20 as well as other issues including cavitary lung masses, nephrotic syndrome and minimal-change disease.  Please see discharge summary for further details.  Patient was noted to have been evaluated at [facility] back in January and discharged on Eliquis with concern for significant hypoalbuminemia.  Patient was continued on IV anticoagulation and transition to Eliquis at discharge pending follow-up during most recent hospitalization.  Patient reports worsening shortness of breath as well as chest pain over the past 2 to 3 days.  No fevers or chills.  Worsening pleuritic chest pain.  No nausea or vomiting.  Has been compliant with home Eliquis as well as home diabetic regimen.  Patient states she quit smoking cigarettes as well as marijuana since discharge.  Father is at the bedside who also reports compliance.  Patient also reports compliance with nephrotic syndrome regimen including prednisone and tacrolimus.  Has been compliant with home Augmentin for cavitary lesions. Presented to the ER afebrile, heart rate in the 100s, blood pressure in the 0000000 to 123XX123 systolic.  Satting well on room air.  Labs notable for white count 24.2, hemoglobin 10.4.   Creatinine 1.09, bicarb 19.  Troponin 22.  Lactate 2.3-1.4.  EKG was sinus tachycardia.  CTA of the chest with right middle lobe infarct, bulky bilateral pulmonary embolism noted from prior imaging.  Positive nonocclusive thrombus in the left lower lobe similar to prior.  RV to LV ratio of 2.3, cavitary lesions in the lateral and posterior right upper lobe similar to prior.  Positive airspace disease in lower lungs unchanged.     Review of Systems: As mentioned in the history of present illness. All other systems reviewed and are negative. 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 She has never used smokeless tobacco. She reports that she does not currently use alcohol. She reports current drug use. Drug: Marijuana.  No Known Allergies  History reviewed. No pertinent family history.  Prior to Admission medications   Medication Sig Start Date End Date Taking? Authorizing Provider  amoxicillin-clavulanate (AUGMENTIN) 875-125 MG tablet Take 1 tablet by mouth every 12 (twelve) hours for 28 days. 08/25/22 09/22/22 Yes Enzo Bi, MD  apixaban (ELIQUIS) 5 MG TABS tablet Take 1 tablet (5 mg total) by mouth 2 (two) times daily. 09/23/22 12/22/22 Yes Enzo Bi, MD  benzonatate (TESSALON PERLES) 100 MG capsule Take 1 capsule (100 mg total) by mouth 3 (three) times daily as needed for cough. 08/30/22  Yes Hinda Kehr, MD  chlorpheniramine-HYDROcodone (TUSSIONEX) 10-8 MG/5ML Take 5 mLs by mouth every 12 (twelve)  hours as needed for cough. 08/30/22  Yes Hinda Kehr, MD  glucagon 1 MG injection Inject 1 mg into the muscle as needed. 02/26/19  Yes [provider]  LANTUS 100 UNIT/ML injection Inject 0.3 mLs (30 Units total) into the skin at bedtime. 09/06/21 08/30/22 Yes Enzo Bi, MD  Multiple Vitamin (MULTIVITAMIN WITH MINERALS) TABS tablet Take 1 tablet by mouth daily. 08/25/22  Yes Enzo Bi, MD  predniSONE (DELTASONE) 50 MG tablet Take 1 tablet (50 mg total) by mouth daily with breakfast. 08/25/22 09/24/22 Yes Enzo Bi, MD  sulfamethoxazole-trimethoprim (BACTRIM DS) 800-160 MG tablet Take 1 tablet by mouth every Monday, Wednesday, and Friday. 08/25/22 11/23/22 Yes Enzo Bi, MD  tacrolimus (PROGRAF) 1 MG capsule Take 2 capsules (2 mg total) by mouth 2 (two) times daily. 08/25/22 11/23/22 Yes Enzo Bi, MD  torsemide (DEMADEX) 20 MG tablet Take 1 tablet (20 mg total) by mouth daily. 08/25/22 09/24/22 Yes Enzo Bi, MD  APIXABAN Arne Cleveland) VTE STARTER PACK ('10MG'$  AND '5MG'$ ) Take as directed on package: start with two-'5mg'$  tablets twice daily for 7 days. On day 8, switch to one-'5mg'$  tablet twice daily. 08/25/22   Enzo Bi, MD  atorvastatin (LIPITOR) 40 MG tablet Take 1 tablet (40 mg total) by mouth daily. Patient not taking: Reported on 08/22/2022 09/06/21 12/05/21  Enzo Bi, MD  blood glucose meter kit and supplies Dispense based on patient and insurance preference. Use up to four times daily as directed. (FOR ICD-10 E10.9, E11.9). 09/06/21   Enzo Bi, MD  feeding supplement, GLUCERNA SHAKE, (GLUCERNA SHAKE) LIQD Take 237 mLs by mouth 2 (two) times daily between meals. 08/25/22   Enzo Bi, MD  insulin aspart (NOVOLOG) 100 UNIT/ML injection Inject 5-8 Units into the skin 3 (three) times daily before meals. 09/06/21 08/22/22  Enzo Bi, MD  nicotine (NICODERM CQ - DOSED IN MG/24 HOURS) 14 mg/24hr patch Place 1 patch (14 mg total) onto the skin daily. Patient not taking: Reported on 08/30/2022 08/25/22   Enzo Bi, MD  Vitamin  D, Ergocalciferol, (DRISDOL) 1.25 MG (50000 UNIT) CAPS capsule Take 50,000 Units by mouth every 7 (seven) days. 07/10/22 07/10/23  [provider]    Physical Exam: Vitals:   08/30/22 0900 08/30/22 0910 08/30/22 0930 08/30/22 1000  BP: 97/60  108/61 107/73  Pulse: (!) 103  (!) 105 (!) 109  Resp: (!) 33  (!) 30 (!) 22  Temp:  98.8 F (37.1 C)    TempSrc:  Oral    SpO2: 96%  94% 93%  Weight:      Height:       Physical Exam Constitutional:      General: She is not in acute distress. HENT:     Head: Normocephalic and atraumatic.     Mouth/Throat:     Mouth: Mucous membranes are moist.  Eyes:     Pupils: Pupils are equal, round, and reactive to light.  Cardiovascular:     Rate and Rhythm: Normal rate and regular rhythm.  Pulmonary:     Effort: Pulmonary effort is normal.  Abdominal:     General: Bowel sounds are normal.     Comments: + abd distension    Musculoskeletal:        General: Normal range of  motion.     Cervical back: Normal range of motion.     Comments: + mild LE swelling    Skin:    General: Skin is warm.  Neurological:     General: No focal deficit present.  Psychiatric:        Mood and Affect: Mood normal.     Data Reviewed:  There are no new results to review at this time.  CT Angio Chest PE W/Cm &/Or Wo Cm CLINICAL DATA:  Status post thrombectomy. History of pulmonary embolus.  EXAM: CT ANGIOGRAPHY CHEST WITH CONTRAST  TECHNIQUE: Multidetector CT imaging of the chest was performed using the standard protocol during bolus administration of intravenous contrast. Multiplanar CT image reconstructions and MIPs were obtained to evaluate the vascular anatomy.  RADIATION DOSE REDUCTION: This exam was performed according to the departmental dose-optimization program which includes automated exposure control, adjustment of the mA and/or kV according to patient size and/or use of iterative reconstruction technique.  CONTRAST:  4m OMNIPAQUE  IOHEXOL 350 MG/ML SOLN  COMPARISON:  Chest x-ray earlier same day.  Chest CT 02/02/2023  FINDINGS: Cardiovascular: Heart size upper normal with trace pericardial effusion, similar to prior. No thoracic aortic aneurysm. Enlargement of the pulmonary outflow tract/main pulmonary arteries suggests pulmonary arterial hypertension. Bulky pulmonary embolism again noted bilaterally, occlusive in right middle lobe lobar branch and segmental branches to the right lower lobe. Medial segmental and subsegmental branches to the right lower lobe are patent on the current study despite being occluded previously. Small volume occlusive thrombus in a subsegmental right upper lobe branches similar to prior. No substantial change in appearance of nonocclusive thrombus in segmental branches to the left lower lobe with similar occlusion of some subsegmental left lower lobe pulmonary arteries. RV LV ratio is 2.3 today compared to 1.9 previously.  Mediastinum/Nodes: No mediastinal lymphadenopathy. Consolidative airspace disease is seen in the right hilum. The esophagus has normal imaging features. There is no axillary lymphadenopathy.  Lungs/Pleura: Similar patchy ground-glass opacity in both upper lobes. Interval development of confluent patchy and posterior consolidative airspace disease in the right middle lobe suggesting infarct. Cavitary lesions in the lateral and posterior right upper lobe are similar to prior. Nodular and patchy areas of consolidative airspace disease in the lower lungs again noted, stable to minimally progressive in the interval. No substantial pleural effusion.  Upper Abdomen: Visualized portion of the upper abdomen is unremarkable.  Musculoskeletal: No worrisome lytic or sclerotic osseous abnormality.  Review of the MIP images confirms the above findings.  IMPRESSION: 1. Interval development of confluent patchy and posterior consolidative airspace disease in the right  middle lobe suggesting infarct. 2. Bulky bilateral pulmonary embolism again noted, occlusive in right middle lobe lobar branch and segmental branches to the right lower lobe, similar to prior. Medial segmental and subsegmental branches to the right lower lobe are patent on the current study despite being occluded previously. Small volume occlusive thrombus in a subsegmental right upper lobe branches similar to prior. 3. No substantial change in appearance of nonocclusive thrombus in segmental branches to the left lower lobe with similar occlusion of some subsegmental left lower lobe pulmonary arteries. 4. Enlargement of the pulmonary outflow tract/main pulmonary arteries suggests pulmonary arterial hypertension. RV/LV ratio is 2.3 today compared to 1.9 previously. 5. Cavitary lesions in the lateral and posterior right upper lobe are similar to prior. 6. Nodular and patchy areas of consolidative airspace disease in the lower lungs again noted, stable to minimally progressive in the interval.  Critical Value/emergent results were called by telephone at the time of interpretation on 08/30/2022 at 7:59 am to provider Dr. Cherylann Banas, who verbally acknowledged these results.  Electronically Signed   By: Misty Stanley M.D.   On: 08/30/2022 07:59 DG Chest Port 1 View CLINICAL DATA:  Chest pain.  Recent pulmonary thrombectomy.  EXAM: PORTABLE CHEST 1 VIEW  COMPARISON:  08/22/2022.  FINDINGS: Left lung clear. Similar patchy airspace disease in the right mid lung with progressive patchy consolidative opacity at the right base. Right mid and lower lung cavitary lesions again noted, better characterized on recent CT scan. Possible small right pleural effusion. The cardiopericardial silhouette is within normal limits for size. Telemetry leads overlie the chest.  IMPRESSION: 1. Cavitary lesions again noted right mid lung and peripheral right lung base. 2. Interval progression of patchy  consolidative opacity in the inferior right lung.  Electronically Signed   By: Misty Stanley M.D.   On: 08/30/2022 05:46   Lab Results  Component Value Date   WBC 24.2 (H) 08/30/2022   HGB 10.4 (L) 08/30/2022   HCT 32.8 (L) 08/30/2022   MCV 88.6 08/30/2022   PLT 518 (H) 123XX123   Last metabolic panel Lab Results  Component Value Date   GLUCOSE 53 (L) 08/30/2022   NA 136 08/30/2022   K 3.6 08/30/2022   CL 107 08/30/2022   CO2 19 (L) 08/30/2022   BUN 29 (H) 08/30/2022   CREATININE 1.09 (H) 08/30/2022   GFRNONAA >60 08/30/2022   CALCIUM 7.0 (L) 08/30/2022   PHOS 3.0 08/25/2022   PROT 4.0 (L) 08/23/2022   ALBUMIN <1.5 (L) 08/23/2022   LABGLOB 2.9 08/23/2022   AGRATIO 0.2 (L) 08/23/2022   BILITOT 0.4 08/23/2022   ALKPHOS 117 08/23/2022   AST 17 08/23/2022   ALT 12 08/23/2022   ANIONGAP 10 08/30/2022    Assessment and Plan: * Pulmonary embolism ([place]) Recurring issue in the setting of hypoalbuminemia as well as noted prior tobacco/marijuana use with recent admission February 20 through February 23 for similar issues including requiring thrombectomy. The patient and her father reports compliance with Eliquis Patient states she has not smoked tobacco or marijuana since discharge CT angio chest with noted recurrent bulky bilateral pulmonary embolus and as well as pulmonary infarcts Per ER physician Dr. Cherylann Banas, case discussed with vascular surgeon Dr. Lucky Cowboy with plan for possible thrombectomy Patient will be continued on heparin drip in the interim Pain control Supplemental oxygen Follow up vascular surgery recommendations.     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 Recently evaluated by nephrology during recent admission Started on prednisone 50 mg as well as prophylactic Bactrim and tacrolimus Will continue home medications for now Does have some minimal to mild generalized edema on exam Continue torsemide Nephrology  consult as clinically indicated  HTN (hypertension) BP stable Titrate home regimen  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 ([place]) Poorly controlled w/ most recent A1C 9.1 08/25/22 Discussed importance of blood sugar controlled  SSI    Leukocytosis WBC 24 on presentation  Suspect multifactorial in setting of steroid use, pulmonary infarcts as well as cavitary lesions Fairly stable in review of trend from prior admission Will monitor for now Continue treatment regimen Reassess if there is a significant uptrend or if there is a clinical decline in the patient Follow closely  Cavitary lesion of lung Cavitary lesions of the lung relatively unchanged from prior admission Will continue Augmentin for discharge per ID  recommendations Will touch base with infectious disease for any further recommendations Formal ID consult as clinically indicated Follow  Tobacco dependence Patient ports no longer smoking Follow  Marijuana abuse Patient reports no longer smoking Will monitor for now      Advance Care Planning:   Code Status: Full Code   Consults: Vascular Surgery w/ Dr. Lucky Cowboy, will plan for nephrology, ID as clinically indicated   Family Communication: Father at the bedside   Severity of Illness: The appropriate patient status for this patient is INPATIENT. Inpatient status is judged to be reasonable and necessary in order to provide the required intensity of service to ensure the patient's safety. The patient's presenting symptoms, physical exam findings, and initial radiographic and laboratory data in the context of their chronic comorbidities is felt to place them at high risk for further clinical deterioration. Furthermore, it is not anticipated that the patient will be medically stable for discharge from the hospital within 2 midnights of admission.   * I certify that at the point of admission it is my clinical judgment that the patient will require inpatient hospital  care spanning beyond 2 midnights from the point of admission due to high intensity of service, high risk for further deterioration and high frequency of surveillance required.*  Author: Deneise Lever, MD 08/30/2022 11:52 AM  For on call review [email or web address].

## 2022-08-30 NOTE — Assessment & Plan Note (Signed)
Recurring issue in the setting of hypoalbuminemia as well as noted prior tobacco/marijuana use with recent admission February 20 through February 23 for similar issues including requiring thrombectomy. The patient and her father reports compliance with Eliquis Patient states she has not smoked tobacco or marijuana since discharge CT angio chest with noted recurrent bulky bilateral pulmonary embolus and as well as pulmonary infarcts Per ER physician Dr. Cherylann Banas, case discussed with vascular surgeon Dr. Lucky Cowboy with plan for possible thrombectomy Patient will be continued on heparin drip in the interim Pain control Supplemental oxygen Follow up vascular surgery recommendations.

## 2022-08-30 NOTE — ED Notes (Signed)
Patient to CT at this time.

## 2022-08-30 NOTE — Assessment & Plan Note (Signed)
Patient reports no longer smoking Will monitor for now

## 2022-08-30 NOTE — ED Notes (Signed)
Blood sugar check noted to be 39. Karma Greaser, MD made aware and order for D50 placed and given. Patient drinking juice at this time.

## 2022-08-30 NOTE — Consult Note (Signed)
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 She presents to the hospital with shortness of breath 8 days ago, diagnosed with PE/RV strain, RUL cavitary lesions RLL opacity  2/20 admission Patient presents for sudden onset increase in shortness of breath of a few days in duration. She reports her symptoms started around Saturday. She reports some associated dizziness but denies loss of consciousness. This is associated with an increased cough productive of greenish sputum over the past 3 to 4 days. She reports a dry cough for over 3 to 4 weeks.  She was started on Eliquis 07/06/2022 following an admission to [facility] (06/30/2022 - 07/05/2022) for AKI and reportedly ran out of it. She is followed by nephrology outpatient for minimal change disease. Patient is maintained on prednisone and tacrolimus for immune suppression for her minimal change disease, and had previously received Rituximab but is not on it. QuantGOLD sent on 07/05/2022 was negative. She was also started on Bactrim for prophylaxis. S/p THROMBECTOMY  2/20 admission discharged on ABX and oral Eliquis  Patient lives with her grandfather. 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 Her grandfather has a dog that stays outside. She has no birds and no other exposures. Patient reports vaping marijuana products as well as nicotine products.  2/20 CT chest      2/28 ADMISSION with progressive SOB and DOE and cough  2/28 CT Chest    Patient was seen by [place], SEEMS PE ARE AT [facility] 2/20 but current CT scan shows progression of RLL  opacity  I evaluated patient for [place] INFECTION Patient is very SOB, very weak, bad cough-SHE WILL NOT TOLERATE PROCEDURE AT THIS TIME  PARENTS IN AGREEMENT    Review of Systems  Review of Systems  Constitutional:  Negative for chills, fever and weight loss.  Respiratory:  Positive for cough, sputum production and shortness of breath. Negative for hemoptysis and wheezing.   Cardiovascular:  Negative for chest pain.  Skin:  Negative for rash.  Neurological:  Positive for dizziness. Negative for weakness.     Objective   Blood pressure (!) 80/68, pulse (!) 109, temperature 99.3 F (37.4 C), temperature source Oral, resp. rate 20, height '4\' 11"'$  (1.499 m), weight 58.3 kg, SpO2 93 %.       No intake or output data in the 24 hours ending 08/30/22 1836  Filed Weights   08/30/22 0439 08/30/22 0559  Weight: 64.5 kg 58.3 kg    Examination: Physical Exam Constitutional:      General: She is in acute distress.     Appearance: She is well-developed. She is ill-appearing.  HENT:     Head: Normocephalic.     Mouth/Throat:     Mouth: Mucous membranes are moist.  Cardiovascular:     Rate and Rhythm: Normal rate and regular rhythm.     Heart sounds: Normal heart sounds.  Pulmonary:     Effort: Tachypnea present.     Breath sounds: Wheezing, rhonchi and rales present.  Abdominal:     Palpations: Abdomen is soft.  Musculoskeletal:     Cervical back:  Normal range of motion.  Neurological:     Mental Status: She is oriented to person, place, and time.      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   Surgical History:   Past Surgical History:  Procedure Laterality Date   PULMONARY THROMBECTOMY Bilateral 08/22/2022   Procedure: PULMONARY  THROMBECTOMY;  Surgeon: Algernon Huxley, MD;  Location: [facility];  Service: Cardiovascular;  Laterality: Bilateral;     Social History:   reports that she has been smoking e-cigarettes. She has never used smokeless tobacco. She reports that she does not currently use alcohol. She reports current drug use. Drug: Marijuana.   Family History:  Her family history is not on file.   Allergies No Known Allergies   Home Medications  Prior to Admission medications   Medication Sig Start Date End Date Taking? Authorizing Provider  apixaban (ELIQUIS) 5 MG TABS tablet Take 5 mg by mouth 2 (two) times daily. 07/05/22  Yes [provider]  glucagon 1 MG injection Inject 1 mg into the muscle as needed. 02/26/19  Yes [provider]  insulin aspart (NOVOLOG) 100 UNIT/ML injection Inject 5-8 Units into the skin 3 (three) times daily before meals. 09/06/21 08/22/22 Yes Enzo Bi, MD  LANTUS 100 UNIT/ML injection Inject 0.3 mLs (30 Units total) into the skin at bedtime. 09/06/21 08/22/22 Yes Enzo Bi, MD  tacrolimus (PROGRAF) 1 MG capsule Take 2 mg by mouth 2 (two) times daily.   Yes [provider]  Vitamin D, Ergocalciferol, (DRISDOL) 1.25 MG (50000 UNIT) CAPS capsule Take 50,000 Units by mouth every 7 (seven) days. 07/10/22 07/10/23 Yes [provider]  acetaminophen (TYLENOL) 325 MG tablet Take 325 mg by mouth every 6 (six) hours as needed. Patient not taking: Reported on 09/26/2021 01/14/17   [provider]  atorvastatin (LIPITOR) 40 MG tablet Take 1 tablet (40 mg total) by mouth daily. Patient not taking: Reported on 08/22/2022 09/06/21 12/05/21  Enzo Bi, MD  blood glucose meter kit and supplies Dispense based on patient and insurance preference. Use up to four times daily as directed. (FOR ICD-10 E10.9, E11.9). 09/06/21   Enzo Bi, MD  sulfamethoxazole-trimethoprim (BACTRIM DS) 800-160 MG tablet Take 1 tablet by mouth 3 (three) times a week. Patient not taking:  Reported on 08/22/2022 07/07/22   [provider]  torsemide (DEMADEX) 20 MG tablet Take 1 tablet (20 mg total) by mouth daily. 10/01/21 10/31/21  Val Riles, MD      Assessment & Plan:   Cavitary Pulmonary Lesions RLL opacity  Chest CT on my review is notable for multiple foci of cavitary lesions in addition to mosaic attenuation with ground glass opacities. The differential includes infectious etiologies (bacterial, AFB, fungal, septic emboli)   The mosaic attenuation is possible in settings of pulmonary embolism but is non-specific and can be seen in infectious and inflammatory conditions. Other notable finding on my review of the CT is reticulation in the lower lobes, more so in the right lower lobe, that appears to be present on previous imaging (abdominal CT's performed in 2021 and 2023)   AT THIS TIME PATIENT WOULD BE HIGH RISK FOR BRONCH  Obtain Sputum Cultures AFB, Fungal sputum cultures ID consulted-start zosyn Check PROCAL -follow up respiratory and blood cultures  Submassive Pulmonary Embolus no changes Continue heparin infusion -lifelong anti-coagulation recommended  in setting of MCG nephrotic syndrome  Prognosis is guarded  Family updated  at bedside(mother,father)    Corrin Parker, M.D.  [facility]  Medical Director [place] Director [place] Department

## 2022-08-30 NOTE — ED Notes (Signed)
Patient blood sugar rechecked. Patient and family updated on plan of care.

## 2022-08-30 NOTE — Assessment & Plan Note (Signed)
Patient ports no longer smoking Follow

## 2022-08-30 NOTE — Assessment & Plan Note (Signed)
WBC 24 on presentation  Suspect multifactorial in setting of steroid use, pulmonary infarcts as well as cavitary lesions Fairly stable in review of trend from prior admission Will monitor for now Continue treatment regimen Reassess if there is a significant uptrend or if there is a clinical decline in the patient Follow closely

## 2022-08-30 NOTE — Inpatient Diabetes Management (Signed)
Inpatient Diabetes Program Recommendations  AACE/ADA: New Consensus Statement on Inpatient Glycemic Control (2015)  Target Ranges:  Prepandial:   less than 140 mg/dL      Peak postprandial:   less than 180 mg/dL (1-2 hours)      Critically ill patients:  140 - 180 mg/dL   Lab Results  Component Value Date   GLUCAP 186 (H) 08/30/2022   HGBA1C 9.1 (H) 08/25/2022    Review of Glycemic Control  Latest Reference Range & Units 08/30/22 06:55 08/30/22 07:11  Glucose-Capillary 70 - 99 mg/dL 39 (LL) 186 (H)  (LL): Data is critically low (H): Data is abnormally high  Diabetes history: DM1(does not make insulin.  Needs correction, basal and meal coverage)  Outpatient Diabetes medications:  Lantus 22 units QHS, Novolog 1 units for every 8 carbs, Novolog 1 units for every 50 mg/dL >150 mg/dL [facility] endocrinology  Current orders for Inpatient glycemic control: None  Inpatient Diabetes Program Recommendations:    If appropriate, please consider:  Semglee 20 units QHS Novolog 0-9 units Q4H if NPO  Once eating-Novolog 0-9 units TID and 3 units TID with meals  Will continue to follow while inpatient.  Thank you, Reche Dixon, MSN, [place] [facility] Coordinator Inpatient Diabetes Program [phone] (team pager from 8a-5p)

## 2022-08-30 NOTE — ED Triage Notes (Signed)
Pt presents ambulatory to triage via POV with complaints of CP following a thrombectomy on 2/20. Pt rates the pain 8/10 worse when takinga  deep breath. No meds taken PTA. A&Ox4 at this time. Denies fevers, chills, N/V/D.

## 2022-08-30 NOTE — Progress Notes (Signed)
ANTICOAGULATION CONSULT NOTE - Initial Consult  Pharmacy Consult for Heparin Infusion  Indication: pulmonary embolus  No Known Allergies  Patient Measurements: Height: '4\' 11"'$  (149.9 cm) Weight: 58.3 kg (128 lb 9.6 oz) IBW/kg (Calculated) : 43.2   Vital Signs: Temp: 98.4 F (36.9 C) (02/28 0447) Temp Source: Oral (02/28 0447) BP: 95/61 (02/28 0745) Pulse Rate: 103 (02/28 0745)  Labs: Recent Labs    08/30/22 0527 08/30/22 0648  HGB 10.4*  --   HCT 32.8*  --   PLT 518*  --   LABPROT 17.1*  --   INR 1.4*  --   CREATININE 1.09*  --   TROPONINIHS 17 22*    Estimated Creatinine Clearance: 62.9 mL/min (A) (by C-G formula based on SCr of 1.09 mg/dL (H)).   Medical History: Past Medical History:  Diagnosis Date   Diabetes mellitus without complication ([place])    Dyslipidemia    Hypoalbuminemia    Marijuana abuse    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   Nephrotic syndrome 08/23/2019   Tobacco dependence     Medications:  Apixaban (Eliquis)- Last dose reported 2/27 PM   Assessment: 22 yo female with worsening right upper chest pain and shortness of breath. Patient recently diagnosed w/extensive PE S/P thrombectomy and started on Eliquis. Pharmacy has been consulted for heparin infusion dosing and monitoring for PE.   Goal of Therapy:  Heparin level 0.3-0.7 units/ml aPTT 66-102 seconds Monitor platelets by anticoagulation protocol: Yes   Plan:  Give 4000 units bolus x 1 Start heparin infusion at 1000 units/hr Will need to adjust heparin infusion by aPTT levels until aPPT and HL (anti-Xa level) correlate.  Check aPPT and anti-Xa level in 6 hours and daily while on heparin Continue to monitor H&H and platelets  Pernell Dupre, PharmD, BCPS Clinical Pharmacist 08/30/2022 8:31 AM

## 2022-08-30 NOTE — ED Notes (Signed)
Patient informed of need for urine sample. Unable to provide one at this time.

## 2022-08-30 NOTE — Progress Notes (Signed)
Pharmacy Antibiotic Note  Catherine [name] is a 22 y.o. female admitted on 08/30/2022 with HCAP. Pharmacy has been consulted for Zosyn (piperacillin/tazobactam) dosing.  Plan: Zosyn (piperacillin/tazobactam) 3.375 grams every 8 hours (4 hour infusion)  Height: '4\' 11"'$  (149.9 cm) Weight: 58.3 kg (128 lb 9.6 oz) IBW/kg (Calculated) : 43.2  Temp (24hrs), Avg:99 F (37.2 C), Min:98.4 F (36.9 C), Max:99.3 F (37.4 C)  Recent Labs  Lab 08/24/22 0522 08/25/22 0404 08/30/22 0527 08/30/22 0757 08/30/22 1004  WBC 16.4* 15.4* 24.2*  --   --   CREATININE 1.39* 1.49* 1.09*  --   --   LATICACIDVEN  --   --   --  2.3* 1.4    Estimated Creatinine Clearance: 62.9 mL/min (A) (by C-G formula based on SCr of 1.09 mg/dL (H)).    No Known Allergies  Antimicrobials this admission: Zosyn 2/28 >>   Dose adjustments this admission: N/a  Microbiology results: 2/28 BCx: in process 2/28 Sputum: to be collected 2/28 AFB: to be collected 2/28 MRSA PCR: to be collected   Thank you for allowing pharmacy to be a part of this patient's care.  Glean Salvo, PharmD, BCPS Clinical Pharmacist  08/30/2022 6:57 PM

## 2022-08-30 NOTE — Consult Note (Signed)
Hospital Consult    Reason for Consult:  Pulmonary Embolism Requesting Physician:  Dr. Shanda Howells MD MRN #:  [id number]  History of Present Illness: Catherine [name] is a 22 y.o. female is a 22 y.o. female with medical history significant of multiple medical issues including type 1 diabetes poorly controlled, hypoalbuminemia, stage III CKD, nephrotic syndrome, tobacco abuse, marijuana abuse presenting with recurrent pulmonary embolus with infarcts.  Patient noted to have been admitted February 20 through 23 for similar issues with noted pulmonary embolus with right heart strain that required thrombectomy by Dr. Lucky Cowboy on February 20 as well as other issues including cavitary lung masses, nephrotic syndrome and minimal-change disease.   Patient reports worsening shortness of breath as well as chest pain over the past 2 to 3 days. No fevers or chills. Worsening pleuritic chest pain. No nausea or vomiting. Has been compliant with home Eliquis as well as home diabetic regimen. Patient states she quit smoking cigarettes as well as marijuana since discharge.   Presented to the ER afebrile, heart rate in the 100s, blood pressure in the 0000000 to [id number] systolic. Satting well on room air. Labs notable for white count 24.2, hemoglobin 10.4. Creatinine 1.09, bicarb 19. Troponin 22. Lactate 2.3-1.4. EKG was sinus tachycardia.   Past Medical History:  Diagnosis Date   Diabetes mellitus without complication ([place])    Dyslipidemia    Hypoalbuminemia    Marijuana abuse    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   Nephrotic syndrome 08/23/2019   Tobacco dependence     Past Surgical History:  Procedure Laterality Date   PULMONARY THROMBECTOMY Bilateral 08/22/2022   Procedure: PULMONARY THROMBECTOMY;  Surgeon: Algernon Huxley, MD;  Location: [place] [facility];  Service: Cardiovascular;  Laterality: Bilateral;    No Known Allergies  Prior to Admission medications   Medication Sig Start Date End Date Taking?  Authorizing Provider  amoxicillin-clavulanate (AUGMENTIN) 875-125 MG tablet Take 1 tablet by mouth every 12 (twelve) hours for 28 days. 08/25/22 09/22/22 Yes Enzo Bi, MD  apixaban (ELIQUIS) 5 MG TABS tablet Take 1 tablet (5 mg total) by mouth 2 (two) times daily. 09/23/22 12/22/22 Yes Enzo Bi, MD  benzonatate (TESSALON PERLES) 100 MG capsule Take 1 capsule (100 mg total) by mouth 3 (three) times daily as needed for cough. 08/30/22  Yes Hinda Kehr, MD  chlorpheniramine-HYDROcodone (TUSSIONEX) 10-8 MG/5ML Take 5 mLs by mouth every 12 (twelve) hours as needed for cough. 08/30/22  Yes Hinda Kehr, MD  glucagon 1 MG injection Inject 1 mg into the muscle as needed. 02/26/19  Yes [provider]  LANTUS 100 UNIT/ML injection Inject 0.3 mLs (30 Units total) into the skin at bedtime. 09/06/21 08/30/22 Yes Enzo Bi, MD  Multiple Vitamin (MULTIVITAMIN WITH MINERALS) TABS tablet Take 1 tablet by mouth daily. 08/25/22  Yes Enzo Bi, MD  predniSONE (DELTASONE) 50 MG tablet Take 1 tablet (50 mg total) by mouth daily with breakfast. 08/25/22 09/24/22 Yes Enzo Bi, MD  sulfamethoxazole-trimethoprim (BACTRIM DS) 800-160 MG tablet Take 1 tablet by mouth every Monday, Wednesday, and Friday. 08/25/22 11/23/22 Yes Enzo Bi, MD  tacrolimus (PROGRAF) 1 MG capsule Take 2 capsules (2 mg total) by mouth 2 (two) times daily. 08/25/22 11/23/22 Yes Enzo Bi, MD  torsemide (DEMADEX) 20 MG tablet Take 1 tablet (20 mg total) by mouth daily. 08/25/22 09/24/22 Yes Enzo Bi, MD  APIXABAN Arne [place]) VTE STARTER PACK ('10MG'$  AND '5MG'$ ) Take as directed on package: start with two-'5mg'$  tablets twice daily for 7 days.  On day 8, switch to one-'5mg'$  tablet twice daily. 08/25/22   Enzo Bi, MD  atorvastatin (LIPITOR) 40 MG tablet Take 1 tablet (40 mg total) by mouth daily. Patient not taking: Reported on 08/22/2022 09/06/21 12/05/21  Enzo Bi, MD  blood glucose meter kit and supplies Dispense based on patient and insurance preference. Use up to four  times daily as directed. (FOR ICD-10 E10.9, E11.9). 09/06/21   Enzo Bi, MD  feeding supplement, GLUCERNA SHAKE, (GLUCERNA SHAKE) LIQD Take 237 mLs by mouth 2 (two) times daily between meals. 08/25/22   Enzo Bi, MD  insulin aspart (NOVOLOG) 100 UNIT/ML injection Inject 5-8 Units into the skin 3 (three) times daily before meals. 09/06/21 08/22/22  Enzo Bi, MD  nicotine (NICODERM CQ - DOSED IN MG/24 HOURS) 14 mg/24hr patch Place 1 patch (14 mg total) onto the skin daily. Patient not taking: Reported on 08/30/2022 08/25/22   Enzo Bi, MD  Vitamin D, Ergocalciferol, (DRISDOL) 1.25 MG (50000 UNIT) CAPS capsule Take 50,000 Units by mouth every 7 (seven) days. 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  Tobacco Use   Smoking status: Every Day    Types: E-cigarettes   Smokeless tobacco: Never  Vaping Use   Vaping Use: Never used  Substance and Sexual Activity   Alcohol use: Not Currently   Drug use: Yes    Types: Marijuana    Comment: daily use   Sexual activity: Not Currently  Other Topics Concern   Not on file  Social History Narrative   Not on file   Social Determinants of Health   Financial Resource Strain: Not on file  Food Insecurity: No Food Insecurity (08/30/2022)   Hunger Vital Sign    Worried About Running Out of Food in the Last Year: Never true    Ran Out of Food in the Last Year: Never true  Transportation Needs: No Transportation Needs (08/30/2022)   PRAPARE - Hydrologist (Medical): No    Lack of Transportation (Non-Medical): No  Physical Activity: Not on file  Stress: Not on file  Social Connections: Not on file  Intimate Partner Violence: Not At Risk (08/30/2022)   Humiliation, Afraid,  Rape, and Kick questionnaire    Fear of Current or Ex-Partner: No    Emotionally Abused: No    Physically Abused: No    Sexually Abused: No     History reviewed. No pertinent family history.  ROS: Otherwise negative unless mentioned in HPI  Physical Examination  Vitals:   08/30/22 1245 08/30/22 1248  BP:    Pulse:    Resp:    Temp:    SpO2: (!) 85% 93%   Body mass index is 25.97 kg/m.  General:  WDWN in NAD Gait: Not observed HENT: WNL, normocephalic Pulmonary: normal non-labored breathing, without Rales, rhonchi,  wheezing Cardiac: regular, Tachycardia, without  Murmurs, rubs or gallops; without carotid bruits Abdomen: Positive Bowel sounds, soft, NT/ND, no masses Skin: without rashes Vascular Exam/Pulses: Palpable Radial and post tibial pulses bilaterally Extremities: without ischemic changes, without Gangrene , without cellulitis; without open wounds;  Musculoskeletal: no muscle wasting or atrophy  Neurologic: A&O X 3;  No focal weakness  or paresthesias are detected; speech is fluent/normal Psychiatric:  The pt has Normal affect. Lymph:  Unremarkable  CBC    Component Value Date/Time   WBC 24.2 (H) 08/30/2022 0527   RBC 3.70 (L) 08/30/2022 0527   HGB 10.4 (L) 08/30/2022 0527   HCT 32.8 (L) 08/30/2022 0527   PLT 518 (H) 08/30/2022 0527   MCV 88.6 08/30/2022 0527   MCH 28.1 08/30/2022 0527   MCHC 31.7 08/30/2022 0527   RDW 14.7 08/30/2022 0527   LYMPHSABS 4.7 (H) 08/22/2022 0708   MONOABS 1.0 08/22/2022 0708   EOSABS 0.2 08/22/2022 0708   BASOSABS 0.1 08/22/2022 0708    BMET    Component Value Date/Time   NA 136 08/30/2022 0527   K 3.6 08/30/2022 0527   CL 107 08/30/2022 0527   CO2 19 (L) 08/30/2022 0527   GLUCOSE 53 (L) 08/30/2022 0527   BUN 29 (H) 08/30/2022 0527   CREATININE 1.09 (H) 08/30/2022 0527   CALCIUM 7.0 (L) 08/30/2022 0527   GFRNONAA >60 08/30/2022 0527   GFRAA >60 10/07/2019 1509    COAGS: Lab Results  Component Value Date    INR 1.4 (H) 08/30/2022   INR 1.4 (H) 08/22/2022   INR 1.2 09/26/2021     Non-Invasive Vascular Imaging:   EXAM: 08/22/22 CT ANGIOGRAPHY CHEST WITH CONTRAST  IMPRESSION: 1. Acute bilateral pulmonary emboli with CT evidence of right heart strain (RV/LV Ratio = 1.9 ) consistent with at least submassive (intermediate risk) PE. The presence of right heart strain has been associated with an increased risk of morbidity and mortality. Please refer to the "Code PE Focused" order set in EPIC. 2. Multifocal bilateral upper lobe predominant pulmonary nodules/masses many of which are cavitary. Findings are favored to reflect an atypical infectious process such as septic emboli. 3. Multifocal ground-glass opacities with subpleural sparing likely reflects a combination of pulmonary hemorrhage and/or an infectious or inflammatory etiology. 4. Hyperdensity vs focus of hyperenhancement in the right upper pole kidney, nonspecific suggest further evaluation with CT of the abdomen and pelvis upon stabilization of patient.  EXAM: 08/30/22 CT ANGIOGRAPHY CHEST WITH CONTRAST  IMPRESSION: 1. Interval development of confluent patchy and posterior consolidative airspace disease in the right middle lobe suggesting infarct. 2. Bulky bilateral pulmonary embolism again noted, occlusive in right middle lobe lobar branch and segmental branches to the right lower lobe, similar to prior. Medial segmental and subsegmental branches to the right lower lobe are patent on the current study despite being occluded previously. Small volume occlusive thrombus in a subsegmental right upper lobe branches similar to prior. 3. No substantial change in appearance of nonocclusive thrombus in segmental branches to the left lower lobe with similar occlusion of some subsegmental left lower lobe pulmonary arteries. 4. Enlargement of the pulmonary outflow tract/main pulmonary arteries suggests pulmonary arterial hypertension.  RV/LV ratio is 2.3 today compared to 1.9 previously. 5. Cavitary lesions in the lateral and posterior right upper lobe are similar to prior. 6. Nodular and patchy areas of consolidative airspace disease in the lower lungs again noted, stable to minimally progressive in the interval.  EXAM: 08/30/22 PORTABLE CHEST 1 VIEW  IMPRESSION: 1. Cavitary lesions again noted right mid lung and peripheral right lung base. 2. Interval progression of patchy consolidative opacity in the inferior right lung.   Statin:  Yes.   Beta Blocker:  No. Aspirin:  No. ACEI:  No. ARB:  No. CCB use:  No Other antiplatelets/anticoagulants:  Yes.   Eliquis 5 mg BID  ASSESSMENT/PLAN: This is a 22 y.o. female admitted February 20 through 23 for similar issues with noted pulmonary embolus with right heart strain that required thrombectomy by Dr. Lucky Cowboy on February 20 as well as other issues including cavitary lung masses, nephrotic syndrome and minimal-change disease.     She presents back to the [facility] Emergency Room today for increasing SOB. After reviewing the CT Chest from today, although she does still have some significant clot burden, it is significantly improved on the right side from her previous study. A repeat thrombectomy is not going to provide much benefit. The airspace disease is significant and there is definitely a component of pulmonary infarct as well from the previous occlusive thrombus.   PLAN: Pain control Continue anticoagulation as prescribed.  ID follow up due to WBC elevation.  Pulmonary Consult. Patient may benefit from a Bronchoscopy due to elevated WBC's while taking antibiotics and steroids?   Reconsult Vascular Surgery if patient continues to deteriorate.   -Plan was discussed with Dr Leotis Pain and he agrees with the plan.    Drema Pry [facility] 08/30/2022 12:53 PM

## 2022-08-30 NOTE — ED Provider Notes (Addendum)
[facility] Provider Note    Event Date/Time   First MD Initiated Contact with Patient 08/30/22 [phone]     (approximate)   History   Chest Pain   HPI  Catherine [name] is a 22 y.o. female with a complicated medical history that includes type 1 diabetes and recent diagnosis of both nephrotic syndrome and extensive pulmonary emboli status post thrombectomy, recently started on Eliquis.  She was just discharged from the hospital about 5 days ago.  She presents tonight for worsening right upper chest pain and shortness of breath.  She feels like something is tight and stabbing in the area.  She feels like she has been continuing to retain fluid even though she has been urinating and taking her daily torsemide.  She has not had any recent trauma.  Symptoms are worse with exertion and she did go back to work today because she wants to resume her normal activities and life.  No nausea or vomiting or fever.     Physical Exam   Triage Vital Signs: ED Triage Vitals  Enc Vitals Group     BP 08/30/22 0447 97/68     Pulse Rate 08/30/22 0447 (!) 111     Resp 08/30/22 0447 (!) 30     Temp 08/30/22 0447 98.4 F (36.9 C)     Temp Source 08/30/22 0447 Oral     SpO2 08/30/22 0447 99 %     Weight 08/30/22 0439 64.5 kg (142 lb 3.2 oz)     Height 08/30/22 0439 1.499 m ('4\' 11"'$ )     Head Circumference --      Peak Flow --      Pain Score 08/30/22 0438 8     Pain Loc --      Pain Edu? --      Excl. in [place]? --     Most recent vital signs: Vitals:   08/30/22 0559 08/30/22 0713  BP:  (!) 91/58  Pulse: (!) 103 (!) 101  Resp: 20 14  Temp:    SpO2: 92% 98%     General: Awake, moderate respiratory distress in obvious discomfort. CV:  Good peripheral perfusion.  Tachycardia, normal heart sounds, regular rhythm. Resp:  Speaking in short phrases, but the patient has normal breath sounds throughout with no wheezing or rails upon auscultation.  Slightly increased work of  breathing. Abd:  Her abdomen appears somewhat distended, possibly the result of fluid retention (anasarca).  No tenderness to palpation. Other:  No no significant peripheral edema in her legs.  Patient's mood and affect are normal and appropriate under the circumstances.   ED Results / Procedures / Treatments   Labs (all labs ordered are listed, but only abnormal results are displayed) Labs Reviewed  BASIC METABOLIC PANEL - Abnormal; Notable for the following components:      Result Value   CO2 19 (*)    Glucose, Bld 53 (*)    BUN 29 (*)    Creatinine, Ser 1.09 (*)    Calcium 7.0 (*)    All other components within normal limits  CBC - Abnormal; Notable for the following components:   WBC 24.2 (*)    RBC 3.70 (*)    Hemoglobin 10.4 (*)    HCT 32.8 (*)    Platelets 518 (*)    All other components within normal limits  PROTIME-INR - Abnormal; Notable for the following components:   Prothrombin Time 17.1 (*)    INR 1.4 (*)  All other components within normal limits  BRAIN NATRIURETIC PEPTIDE - Abnormal; Notable for the following components:   B Natriuretic Peptide 317.4 (*)    All other components within normal limits  CBG MONITORING, ED - Abnormal; Notable for the following components:   Glucose-Capillary 39 (*)    All other components within normal limits  CBG MONITORING, ED - Abnormal; Notable for the following components:   Glucose-Capillary 186 (*)    All other components within normal limits  TROPONIN I (HIGH SENSITIVITY) - Abnormal; Notable for the following components:   Troponin I (High Sensitivity) 22 (*)    All other components within normal limits  POC URINE PREG, ED  TROPONIN I (HIGH SENSITIVITY)     EKG  ED ECG REPORT I, Hinda Kehr, the attending physician, personally viewed and interpreted this ECG.  Date: 08/30/2022 EKG Time: 4:46 AM Rate: 109 Rhythm: Sinus tachycardia with left posterior fascicular block QRS Axis: normal Intervals: normal ST/T  Wave abnormalities: Non-specific ST segment / T-wave changes, but no clear evidence of acute ischemia. Narrative Interpretation: no definitive evidence of acute ischemia; does not meet STEMI criteria.    RADIOLOGY I viewed and interpreted the patient's 1 view chest x-ray.  There is some patchy areas in the lower lungs but no obvious lobar pneumonia.  The radiologist mentioned interval progression of the previously noted cavitary lesions.    PROCEDURES:  Critical Care performed: No  .1-3 Lead EKG Interpretation  Performed by: Hinda Kehr, MD Authorized by: Hinda Kehr, MD     Interpretation: abnormal     ECG rate:  105   ECG rate assessment: tachycardic     Rhythm: sinus tachycardia     Ectopy: none     Conduction: normal      MEDICATIONS ORDERED IN ED: Medications  morphine (PF) 4 MG/ML injection 4 mg (4 mg Intravenous Given 08/30/22 0528)  ondansetron (ZOFRAN) injection 4 mg (4 mg Intravenous Given 08/30/22 0529)  dextrose 50 % solution 50 mL (50 mLs Intravenous Given 08/30/22 0700)  iohexol (OMNIPAQUE) 350 MG/ML injection 75 mL (48 mLs Intravenous Contrast Given 08/30/22 0733)  fentaNYL (SUBLIMAZE) injection 50 mcg (50 mcg Intravenous Given 08/30/22 0746)     IMPRESSION / MDM / ASSESSMENT AND PLAN / ED COURSE  I reviewed the triage vital signs and the nursing notes.                              Differential diagnosis includes, but is not limited to, acute pulmonary embolism, pain due to revascularization in the setting of Eliquis and known pulmonary emboli, acute healthcare associated pneumonia or worsening pneumonia if she had it previously, ACS, CHF, worsening nephrotic syndrome or fluid retention.  Patient's presentation is most consistent with acute presentation with potential threat to life or bodily function.  Labs/studies ordered: High-sensitivity troponin, BNP, BMP, pro time-INR, CBC, 1 view chest x-ray, EKG Interventions ordered: Morphine 4 mg IV, Zofran 4 mg  IV, [facility] Course my include additional interventions or labs/studies not listed above.)  Patient is in some distress but a think it is most likely due to pain and understandable concern over her situation rather than an acute respiratory issue.  I will treat with morphine and Zofran and reassess.  She is not hypoxemic, satting 99% on room air upon my examination, and I do not think she would benefit from supplemental oxygen.  Given her extensive history, we will check basic labs  as described above but I anticipate the need to obtain another CT scan or CTA chest to evaluate for acute or worsening lung disease.  Discussed plan with patient and her mother who is at bedside, and they agree with the plan.  The patient is on the cardiac monitor to evaluate for evidence of arrhythmia and/or significant heart rate changes.   Clinical Course as of 08/30/22 0749  Wed Aug 30, 2022  0634 Had an extended conversation with the patient regarding her results.  She has an increased leukocytosis of 24 but she has been on steroids.  Her creatinine and BUN are slightly elevated but not substantially and her GFR is greater than 60.  She has a slight elevation of her BNP but this is better than before.  Her high-sensitivity troponin is normal which is reassuring.  I also reviewed the discharge summary from her last hospitalization which talked about the fact that although she was treated empirically for pneumonia, it is thought that most likely the cavitary lesions are due to ischemia from the pulmonary emboli.  This makes sense, and although as documented above her chest x-ray shows evolution of the cavitary areas, I suspect this is a normal progression rather than indicative of a new problem.  We will proceed with a repeat CTA chest to evaluate and compare to the prior scan to see if there are new and acute areas of infarction or if she is experiencing an anticipated evolution of her disease process.  This should  help determine if she needs to be admitted for additional inpatient treatment or if she can continue with outpatient treatment.  Anticipating/hoping for outpatient management, I wrote her a prescription for Tessalon and Tussionex to help with her symptoms at home. [CF]  [phone] Patient feeling somewhat dizzy and tremulous and recheck blood sugar, it is down to 39.  I ordered 1 ampoule of D50 and I am encouraging the patient to eat more food, something like a [place] sandwich [CF]  0719 Transfered to ED care to Dr. Cherylann Banas.  We discussed the case and went over the case at bedside with the patient and her mother.  They understand the plan for further evaluation [CF]  0720 . [CF]    Clinical Course User Index [CF] Hinda Kehr, MD     FINAL CLINICAL IMPRESSION(S) / ED DIAGNOSES   Final diagnoses:  Chest pain, unspecified type  Pulmonary embolism, unspecified chronicity, unspecified pulmonary embolism type, unspecified whether acute cor pulmonale present ([place])  Type 1 diabetes mellitus with other specified complication ([place])  Hypoglycemia  History of nephrotic syndrome     Rx / DC Orders   ED Discharge Orders          Ordered    chlorpheniramine-HYDROcodone (TUSSIONEX) 10-8 MG/5ML  Every 12 hours PRN        08/30/22 0749    benzonatate (TESSALON PERLES) 100 MG capsule  3 times daily PRN        08/30/22 0749             Note:  This document was prepared using Dragon voice recognition software and may include unintentional dictation errors.   Hinda Kehr, MD 08/30/22 [id number]    Hinda Kehr, MD 08/30/22 [phone]

## 2022-08-30 NOTE — ED Provider Notes (Signed)
-----------------------------------------   8:53 AM on 08/30/2022 -----------------------------------------   I took over care of this patient from Dr. Karma Greaser.  CT shows similar clot burden to when the patient initially presented to the hospital on 2/20 prior to her thrombectomy, which raises the concern for recurrent or worsening clot burden, especially given the patient's continued pain and other symptoms.  I consulted Dr. Lucky Cowboy from vascular surgery and discussed the case with him; he recommends restarting the patient on a heparin infusion and he will evaluate her for hospital repeat thrombectomy.  WBC count is significant elevated although this may be due to the steroid.  Lactate is only minimally elevated.  The patient's heart rate and blood pressure are borderline but have remained stable throughout her ED visit and appears similar to her vital signs during her hospitalization.  At this time I do not see a specific indication to start IV antibiotics.  I updated the patient and her family member on the imaging findings and the plan of care including readmission to the hospital.  They are in agreement.  I consulted Dr. Ernestina Patches from the hospitalist service; based on our discussion he agrees to admit the patient.   Arta Silence, MD 08/30/22 [phone]

## 2022-08-30 NOTE — Assessment & Plan Note (Signed)
Recurring issue in the setting of hypoalbuminemia as well as noted prior tobacco/marijuana use with recent admission February 20 through February 23 for similar issues including requiring thrombectomy. The patient and her father reports compliance with Eliquis Patient states she has not smoked tobacco or marijuana since discharge CT angio chest with noted recurrent bulky bilateral pulmonary embolus and as well as pulmonary infarcts Per ER physician Dr. Cherylann Banas, case discussed with vascular surgeon Dr. Lucky Cowboy with plan for likely thrombectomy Patient will be continued on heparin drip in the interim Pain control Supplemental oxygen Follow

## 2022-08-30 NOTE — Progress Notes (Signed)
Patient received to unit in stable condition from ER. Patient complaining of pain 7/10 to flank. Medicated per MAR.

## 2022-08-30 NOTE — Progress Notes (Signed)
Report received from [place], [place]. Heparin drip going at 62m/hr (1,000 units/hr).

## 2022-08-30 NOTE — Assessment & Plan Note (Signed)
Poorly controlled w/ most recent A1C 9.1 08/25/22 Discussed importance of blood sugar controlled  SSI

## 2022-08-30 NOTE — Assessment & Plan Note (Signed)
Cavitary lesions of the lung relatively unchanged from prior admission Will continue Augmentin for discharge per ID recommendations Will touch base with infectious disease for any further recommendations Formal ID consult as clinically indicated Follow

## 2022-08-30 NOTE — Progress Notes (Signed)
ANTICOAGULATION CONSULT NOTE - Initial Consult  Pharmacy Consult for Heparin Infusion  Indication: pulmonary embolus  No Known Allergies  Patient Measurements: Height: '4\' 11"'$  (149.9 cm) Weight: 58.3 kg (128 lb 9.6 oz) IBW/kg (Calculated) : 43.2   Vital Signs: Temp: 97.8 F (36.6 C) (02/28 2056) Temp Source: Oral (02/28 2056) BP: 115/72 (02/28 2056) Pulse Rate: 105 (02/28 2056)  Labs: Recent Labs    08/30/22 0527 08/30/22 0648 08/30/22 1512 08/30/22 2215  HGB 10.4*  --   --   --   HCT 32.8*  --   --   --   PLT 518*  --   --   --   APTT 33  --  84* 53*  LABPROT 17.1*  --   --   --   INR 1.4*  --   --   --   HEPARINUNFRC >1.10*  --   --   --   CREATININE 1.09*  --   --   --   TROPONINIHS 17 22*  --   --      Estimated Creatinine Clearance: 62.9 mL/min (A) (by C-G formula based on SCr of 1.09 mg/dL (H)).   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 Patient recently diagnosed w/extensive PE S/P thrombectomy and started on Eliquis. Pharmacy has been consulted for heparin infusion dosing and monitoring for PE.   Baseline heparin level elevated.   Goal of Therapy:  Heparin level 0.3-0.7 units/ml aPTT 66-102 seconds Monitor platelets by anticoagulation protocol: Yes   Plan:  2/28:  aPTT @ 2215 = 53, SUBtherapeutic - will order heparin 1550 units IV X 1 bolus and increase drip rate to 1200 units/hr.  - will recheck aPTT and HL 6 hrs after rate change  Will need to adjust heparin infusion by aPTT levels until aPPT and HL (anti-Xa level) correlate.  Continue to monitor H&H and platelets   Joshalyn Ancheta  D, PharmD Clinical Pharmacist 08/30/2022 11:21 PM

## 2022-08-30 NOTE — Progress Notes (Signed)
Received call from nurse about family/patient's mom wanting to transfer patient back to [place] stated [facility] is familiar with the patient. Mom concerned about complexity of case and recurrence of pulmonary findings, feeling that daughter will recieve better treatment at [facility]. Discussed with mom over the phone via speaker in addition to patient and family close nursing being on the call the patient a likely multifactorial given hypoalbuminemia, minimal-change disease, prior smoking history, nephrotic syndrome-hands coordination of plan of care with multiple subspecialists.  Discussed with mom that transfer will likely need to be coordinated in the morning with otherwise plan of continuing care in conjunction with subspecialist recommendations. Mom is in agreement with this. Will tentatively plan to work on patient transfer to [facility] facility pending on bed availability by the daytime team. Mom and family agreeable with plan present. Will otherwise continue to follow grossly.

## 2022-08-30 NOTE — Progress Notes (Addendum)
ANTICOAGULATION CONSULT NOTE - Initial Consult  Pharmacy Consult for Heparin Infusion  Indication: pulmonary embolus  No Known Allergies  Patient Measurements: Height: '4\' 11"'$  (149.9 cm) Weight: 58.3 kg (128 lb 9.6 oz) IBW/kg (Calculated) : 43.2   Vital Signs: Temp: 99 F (37.2 C) (02/28 1214) Temp Source: Oral (02/28 0910) BP: 88/58 (02/28 1400) Pulse Rate: 90 (02/28 1400)  Labs: Recent Labs    08/30/22 0527 08/30/22 0648 08/30/22 1512  HGB 10.4*  --   --   HCT 32.8*  --   --   PLT 518*  --   --   APTT 33  --  84*  LABPROT 17.1*  --   --   INR 1.4*  --   --   HEPARINUNFRC >1.10*  --   --   CREATININE 1.09*  --   --   TROPONINIHS 17 22*  --      Estimated Creatinine Clearance: 62.9 mL/min (A) (by C-G formula based on SCr of 1.09 mg/dL (H)).   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 Patient recently diagnosed w/extensive PE S/P thrombectomy and started on Eliquis. Pharmacy has been consulted for heparin infusion dosing and monitoring for PE.   Baseline heparin level elevated.   Goal of Therapy:  Heparin level 0.3-0.7 units/ml aPTT 66-102 seconds Monitor platelets by anticoagulation protocol: Yes   Plan: aPTT 84 - therapeutic x 1 Continue heparin infusion at 1000 units/hr Will need to adjust heparin infusion by aPTT levels until aPPT and HL (anti-Xa level) correlate.  Re-check aPPT in 6 hours as well as anti-Xa and aPTT daily while on heparin.  Continue to monitor H&H and platelets   Wynelle Cleveland, PharmD, BCPS Clinical Pharmacist 08/30/2022 3:42 PM

## 2022-08-30 NOTE — ED Notes (Signed)
Patient blood sugar noted to be 53. Provided with apple juice, able to tolerate. MD aware.

## 2022-08-30 NOTE — Assessment & Plan Note (Signed)
BP stable Titrate home regimen 

## 2022-08-31 ENCOUNTER — Encounter
Admission: EM | Disposition: A | Discharge status: Home / Self Care | Payer: Self-pay | Source: Home / Self Care | Attending: Internal Medicine

## 2022-08-31 DIAGNOSIS — I2694 Multiple subsegmental pulmonary emboli without acute cor pulmonale: Secondary | ICD-10-CM | POA: Diagnosis not present

## 2022-08-31 LAB — GLUCOSE, CAPILLARY
Glucose-Capillary: 226 mg/dL — ABNORMAL HIGH (ref 70–99)
Glucose-Capillary: 221 mg/dL — ABNORMAL HIGH (ref 70–99)
Glucose-Capillary: 235 mg/dL — ABNORMAL HIGH (ref 70–99)
Glucose-Capillary: 260 mg/dL — ABNORMAL HIGH (ref 70–99)

## 2022-08-31 LAB — COMPREHENSIVE METABOLIC PANEL
ALT: 10 U/L (ref 0–44)
AST: 11 U/L — ABNORMAL LOW (ref 15–41)
Albumin: <1.5 g/dL — ABNORMAL LOW (ref 3.5–5.0)
Alkaline Phosphatase: 97 U/L (ref 38–126)
Anion gap: 3 — ABNORMAL LOW (ref 5–15)
BUN: 19 mg/dL (ref 6–20)
CO2: 23 mmol/L (ref 22–32)
Calcium: 7.2 mg/dL — ABNORMAL LOW (ref 8.9–10.3)
Chloride: 108 mmol/L (ref 98–111)
Creatinine, Ser: 0.99 mg/dL (ref 0.44–1.00)
GFR, Estimated: >60 mL/min (ref >60)
Glucose, Bld: 238 mg/dL — ABNORMAL HIGH (ref 70–99)
Potassium: 4.2 mmol/L (ref 3.5–5.1)
Sodium: 134 mmol/L — ABNORMAL LOW (ref 135–145)
Total Bilirubin: 0.3 mg/dL (ref 0.3–1.2)
Total Protein: 4.3 g/dL — ABNORMAL LOW (ref 6.5–8.1)

## 2022-08-31 LAB — CBC
HCT: 27.8 % — ABNORMAL LOW (ref 36.0–46.0)
Hemoglobin: 8.9 g/dL — ABNORMAL LOW (ref 12.0–15.0)
MCH: 28.3 pg (ref 26.0–34.0)
MCHC: 32 g/dL (ref 30.0–36.0)
MCV: 88.3 fL (ref 80.0–100.0)
Platelets: 451 10*3/uL — ABNORMAL HIGH (ref 150–400)
RBC: 3.15 MIL/uL — ABNORMAL LOW (ref 3.87–5.11)
RDW: 14.7 % (ref 11.5–15.5)
WBC: 24.2 10*3/uL — ABNORMAL HIGH (ref 4.0–10.5)
nRBC: 0 % (ref 0.0–0.2)

## 2022-08-31 LAB — APTT
aPTT: 77 seconds — ABNORMAL HIGH (ref 24–36)
aPTT: 64 seconds — ABNORMAL HIGH (ref 24–36)
aPTT: 61 seconds — ABNORMAL HIGH (ref 24–36)

## 2022-08-31 LAB — HEPARIN LEVEL (UNFRACTIONATED)
Heparin Unfractionated: 0.22 IU/mL — ABNORMAL LOW (ref 0.30–0.70)
Heparin Unfractionated: 0.17 IU/mL — ABNORMAL LOW (ref 0.30–0.70)

## 2022-08-31 LAB — MAGNESIUM: Magnesium: 1.6 mg/dL — ABNORMAL LOW (ref 1.7–2.4)

## 2022-08-31 LAB — PHOSPHORUS: Phosphorus: 3.2 mg/dL (ref 2.5–4.6)

## 2022-08-31 SURGERY — BRONCHOSCOPY, FLEXIBLE
Anesthesia: General | Laterality: Right

## 2022-08-31 MED ORDER — MAGNESIUM SULFATE 2 GM/50ML IV SOLN
2.0000 g | Freq: Once | INTRAVENOUS | Status: AC
  Administered 2022-08-31: 2 g via INTRAVENOUS
  Filled 2022-08-31: qty 50

## 2022-08-31 MED ORDER — PREDNISONE 50 MG PO TABS
50.0000 mg | ORAL_TABLET | Freq: Every day | ORAL | Status: DC
  Administered 2022-08-31 – 2022-09-05 (×6): 50 mg via ORAL
  Filled 2022-08-31 (×6): qty 1

## 2022-08-31 MED ORDER — TACROLIMUS 1 MG PO CAPS
2.0000 mg | ORAL_CAPSULE | Freq: Two times a day (BID) | ORAL | Status: DC
  Administered 2022-08-31 – 2022-09-05 (×11): 2 mg via ORAL
  Filled 2022-08-31 (×12): qty 2

## 2022-08-31 MED ORDER — GLUCERNA SHAKE PO LIQD
237.0000 mL | Freq: Two times a day (BID) | ORAL | Status: DC
  Administered 2022-09-01 – 2022-09-05 (×8): 237 mL via ORAL

## 2022-08-31 MED ORDER — SULFAMETHOXAZOLE-TRIMETHOPRIM 800-160 MG PO TABS: 1.0000 | ORAL_TABLET | ORAL | Status: DC

## 2022-08-31 MED ORDER — SODIUM CHLORIDE 0.9 % IN NEBU: 3.0000 mL | INHALATION_SOLUTION | Freq: Three times a day (TID) | RESPIRATORY_TRACT | Status: DC | PRN

## 2022-08-31 MED ORDER — HEPARIN BOLUS VIA INFUSION
800.0000 [IU] | Freq: Once | INTRAVENOUS | Status: AC
  Administered 2022-08-31: 800 [IU] via INTRAVENOUS
  Filled 2022-08-31: qty 800

## 2022-08-31 MED ORDER — ENSURE ENLIVE PO LIQD
237.0000 mL | Freq: Two times a day (BID) | ORAL | Status: DC
  Administered 2022-08-31 – 2022-09-05 (×5): 237 mL via ORAL

## 2022-08-31 NOTE — Progress Notes (Signed)
PROGRESS NOTE    Catherine [name]  [id number] DOB: 2001-02-27 DOA: 08/30/2022 PCP: [facility]    Brief Narrative:   22 y.o. female with medical history significant of multiple medical issues including type 1 diabetes poorly controlled, hypoalbuminemia, stage III CKD, nephrotic syndrome, tobacco abuse, marijuana abuse presenting with recurrent pulmonary embolus with infarcts.  Patient noted to have been admitted February 20 through 23 for similar issues with noted pulmonary embolus with right heart strain that required thrombectomy by Dr. Lucky Cowboy on February 20 as well as other issues including cavitary lung masses, nephrotic syndrome and minimal-change disease.  Please see discharge summary for further details.  Patient was noted to have been evaluated at [facility] back in January and discharged on Eliquis with concern for significant hypoalbuminemia.  Patient was continued on IV anticoagulation and transition to Eliquis at discharge pending follow-up during most recent hospitalization.  Patient reports worsening shortness of breath as well as chest pain over the past 2 to 3 days.  No fevers or chills.  Worsening pleuritic chest pain.  No nausea or vomiting.  Has been compliant with home Eliquis as well as home diabetic regimen.  Patient states she quit smoking cigarettes as well as marijuana since discharge.  Father is at the bedside who also reports compliance.  Patient also reports compliance with nephrotic syndrome regimen including prednisone and tacrolimus.  Has been compliant with home Augmentin for cavitary lesions. Presented to the ER afebrile, heart rate in the 100s, blood pressure in the 0000000 to [id number] systolic.  Satting well on room air.  Labs notable for white count 24.2, hemoglobin 10.4.  Creatinine 1.09, bicarb 19.  Troponin 22.  Lactate 2.3-1.4.  EKG was sinus tachycardia.  CTA of the chest with right middle lobe infarct, bulky bilateral pulmonary embolism noted  from prior imaging.  Positive nonocclusive thrombus in the left lower lobe similar to prior.  RV to LV ratio of 2.3, cavitary lesions in the lateral and posterior right upper lobe similar to prior.  Positive airspace disease in lower lungs unchanged.  2/29: Seen in consultation by vascular surgery.  No indication at this point for repeat thrombectomy.  Seen in consultation by pulmonary to consider bronchoscopy however patient continued to high risk at this point and no indication for bronc.  Wanting sputum for AFB assessment however as of this time patient is unable to produce sputum.  Has been clinically improving well on intravenous Zosyn.   Assessment & Plan:   Principal Problem:   Pulmonary embolism (HCC) Active Problems:   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   HTN (hypertension)   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 (HCC)   Marijuana abuse   Tobacco dependence   Cavitary lesion of lung   Leukocytosis  * Pulmonary embolism ([place]) Recurring issue in the setting of hypoalbuminemia as well as noted prior tobacco/marijuana use with recent admission February 20 through February 23 for similar issues including requiring thrombectomy. The patient and her father reports compliance with Eliquis Patient states she has not smoked tobacco or marijuana since discharge CT angio chest with noted recurrent bulky bilateral pulmonary embolus and as well as pulmonary infarcts Per ER physician Dr. Cherylann Banas, case discussed with vascular surgeon Dr. Lucky Cowboy  Per vascular surgery no indication for repeat thrombectomy Plan: Continue IV heparin GTT for now Follow-up hypercoagulable workup including Antithrombin, protein C, protein S, prothrombin As needed pain control Supplemental oxygen  Nephrotic  syndrome due to type 1 diabetes mellitus and history of minimal change disease Recently evaluated by nephrology during recent admission Started on  prednisone 50 mg as well as prophylactic Bactrim and tacrolimus Plan: Will continue home medications for now Does have some minimal to mild generalized edema on exam Continue torsemide Nephrology on consult, case discussed with Dr. Juleen China   HTN (hypertension) BP stable Titrate home regimen   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 (HCC) Poorly controlled w/ most recent A1C 9.1 08/25/22 Discussed importance of blood sugar controlled  SSI      Leukocytosis WBC 24 on presentation  Suspect multifactorial in setting of steroid use, pulmonary infarcts as well as cavitary lesions Fairly stable in review of trend from prior admission Will monitor for now Continue treatment regimen Reassess if there is a significant uptrend or if there is a clinical decline in the patient Follow closely   Cavitary lesion of lung Cavitary lesions of the lung relatively unchanged from prior admission Plan: Patient has been improving on intravenous Zosyn and nebulizer therapy.  Will continue.  Ideally would like to get sputum for AFB however as of this note patient is unable to produce.  Infectious disease on consult.  Case discussed with Dr. Mortimer Fries and Dr. Delaine Lame  Tobacco dependence Patient ports no longer smoking Follow   Marijuana abuse Patient reports no longer smoking Will monitor for now   DVT prophylaxis: Heparin GTT Code Status: Full code Family Communication: Mother and father at bedside 2/29 Disposition Plan: Status is: Inpatient Remains inpatient appropriate because: Cavitary lesions, unclear etiology.  Possibly infectious in nature.  Multiple consultants involved including pulmonology, infectious disease, nephrology   Level of care: Telemetry Medical  Consultants:  Pulmonary ID Vascular surgery  Procedures:  None  Antimicrobials: Zosyn   Subjective: Seen and examined.  Resting in bed.  Appears winded though improved from prior.  Objective: Vitals:    08/31/22 0815 08/31/22 1156 08/31/22 1200 08/31/22 1259  BP: 101/63 96/69    Pulse: (!) 104 (!) 101 98   Resp: 18 18    Temp:  98.2 F (36.8 C)    TempSrc:      SpO2: 100% 100%  96%  Weight:      Height:        Intake/Output Summary (Last 24 hours) at 08/31/2022 1402 Last data filed at 08/31/2022 0538 Gross per 24 hour  Intake 340 ml  Output --  Net 340 ml   Filed Weights   08/30/22 0439 08/30/22 0559  Weight: 64.5 kg 58.3 kg    Examination:  General exam: Appears fatigued Respiratory system: Scattered wheezes, rhonchi, rales.  Normal work of breathing.  3 L Cardiovascular system: S1-S2, RRR, no murmurs, trace pedal edema BLE Gastrointestinal system: Soft, NT/ND, normal bowel sounds Central nervous system: Alert and oriented. No focal neurological deficits. Extremities: Symmetric 5 x 5 power. Skin: No rashes, lesions or ulcers Psychiatry: Judgement and insight appear normal. Mood & affect appropriate.     Data Reviewed: I have personally reviewed following labs and imaging studies  CBC: Recent Labs  Lab 08/25/22 0404 08/30/22 0527 08/31/22 0449  WBC 15.4* 24.2* 24.2*  HGB 9.6* 10.4* 8.9*  HCT 28.7* 32.8* 27.8*  MCV 85.2 88.6 88.3  PLT 285 518* [id number]*   Basic Metabolic Panel: Recent Labs  Lab 08/25/22 0404 08/30/22 0527 08/31/22 0449  NA 131* 136 134*  K 4.5 3.6 4.2  CL 103 107 108  CO2 22 19* 23  GLUCOSE 324*  53* 238*  BUN 42* 29* 19  CREATININE 1.49* 1.09* 0.99  CALCIUM 7.2* 7.0* 7.2*  MG 2.0  --  1.6*  PHOS 3.0  --  3.2   GFR: Estimated Creatinine Clearance: 69.2 mL/min (by C-G formula based on SCr of 0.99 mg/dL). Liver Function Tests: Recent Labs  Lab 08/31/22 0449  AST 11*  ALT 10  ALKPHOS 97  BILITOT 0.3  PROT 4.3*  ALBUMIN <1.5*   No results for input(s): "LIPASE", "AMYLASE" in the last 168 hours. No results for input(s): "AMMONIA" in the last 168 hours. Coagulation Profile: Recent Labs  Lab 08/30/22 0527  INR 1.4*   Cardiac  Enzymes: No results for input(s): "CKTOTAL", "CKMB", "CKMBINDEX", "TROPONINI" in the last 168 hours. BNP (last 3 results) No results for input(s): "PROBNP" in the last 8760 hours. HbA1C: No results for input(s): "HGBA1C" in the last 72 hours. CBG: Recent Labs  Lab 08/30/22 1224 08/30/22 1749 08/30/22 2137 08/31/22 0819 08/31/22 1157  GLUCAP 154* 145* 270* 226* 221*   Lipid Profile: No results for input(s): "CHOL", "HDL", "LDLCALC", "TRIG", "CHOLHDL", "LDLDIRECT" in the last 72 hours. Thyroid Function Tests: No results for input(s): "TSH", "T4TOTAL", "FREET4", "T3FREE", "THYROIDAB" in the last 72 hours. Anemia Panel: No results for input(s): "VITAMINB12", "FOLATE", "FERRITIN", "TIBC", "IRON", "RETICCTPCT" in the last 72 hours. Sepsis Labs: Recent Labs  Lab 08/30/22 0757 08/30/22 1004 08/30/22 2215  PROCALCITON  --   --  <0.10  LATICACIDVEN 2.3* 1.4  --     Recent Results (from the past 240 hour(s))  Resp panel by RT-PCR (RSV, Flu A&B, Covid) Anterior Nasal Swab     Status: None   Collection Time: 08/22/22  7:08 AM   Specimen: Anterior Nasal Swab  Result Value Ref Range Status   SARS Coronavirus 2 by RT PCR NEGATIVE NEGATIVE Final    Comment: (NOTE) SARS-CoV-2 target nucleic acids are NOT DETECTED.  The SARS-CoV-2 RNA is generally detectable in upper respiratory specimens during the acute phase of infection. The lowest concentration of SARS-CoV-2 viral copies this assay can detect is 138 copies/mL. A negative result does not preclude SARS-Cov-2 infection and should not be used as the sole basis for treatment or other patient management decisions. A negative result may occur with  improper specimen collection/handling, submission of specimen other than nasopharyngeal swab, presence of viral mutation(s) within the areas targeted by this assay, and inadequate number of viral copies(<138 copies/mL). A negative result must be combined with clinical observations, patient  history, and epidemiological information. The expected result is Negative.  Fact Sheet for Patients:  EntrepreneurPulse.com.au  Fact Sheet for Healthcare Providers:  IncredibleEmployment.be  This test is no t yet approved or cleared by the [place] FDA and  has been authorized for detection and/or diagnosis of SARS-CoV-2 by FDA under an Emergency Use Authorization (EUA). This EUA will remain  in effect (meaning this test can be used) for the duration of the COVID-19 declaration under Section 564(b)(1) of the Act, 21 U.S.C.section 360bbb-3(b)(1), unless the authorization is terminated  or revoked sooner.       Influenza A by PCR NEGATIVE NEGATIVE Final   Influenza B by PCR NEGATIVE NEGATIVE Final    Comment: (NOTE) The Xpert Xpress SARS-CoV-2/FLU/RSV plus assay is intended as an aid in the diagnosis of influenza from Nasopharyngeal swab specimens and should not be used as a sole basis for treatment. Nasal washings and aspirates are unacceptable for Xpert Xpress SARS-CoV-2/FLU/RSV testing.  Fact Sheet for Patients: EntrepreneurPulse.com.au  Fact  Sheet for Healthcare Providers: IncredibleEmployment.be  This test is not yet approved or cleared by the [place] and has been authorized for detection and/or diagnosis of SARS-CoV-2 by FDA under an Emergency Use Authorization (EUA). This EUA will remain in effect (meaning this test can be used) for the duration of the COVID-19 declaration under Section 564(b)(1) of the Act, 21 U.S.C. section 360bbb-3(b)(1), unless the authorization is terminated or revoked.     Resp Syncytial Virus by PCR NEGATIVE NEGATIVE Final    Comment: (NOTE) Fact Sheet for Patients: EntrepreneurPulse.com.au  Fact Sheet for Healthcare Providers: IncredibleEmployment.be  This test is not yet approved or cleared by the [place] FDA  and has been authorized for detection and/or diagnosis of SARS-CoV-2 by FDA under an Emergency Use Authorization (EUA). This EUA will remain in effect (meaning this test can be used) for the duration of the COVID-19 declaration under Section 564(b)(1) of the Act, 21 U.S.C. section 360bbb-3(b)(1), unless the authorization is terminated or revoked.  Performed at [facility], [address] [place], [place] [address]   Blood culture (routine x 2)     Status: None   Collection Time: 08/22/22  9:18 AM   Specimen: BLOOD  Result Value Ref Range Status   Specimen Description BLOOD BLOOD LEFT ARM  Final   Special Requests   Final    BOTTLES DRAWN AEROBIC AND ANAEROBIC Blood Culture results may not be optimal due to an inadequate volume of blood received in culture bottles   Culture   Final    NO GROWTH 5 DAYS Performed at [facility], [address] [place], [place] [address]    Report Status 08/27/2022 FINAL  Final  Blood culture (routine x 2)     Status: None   Collection Time: 08/22/22 12:02 PM   Specimen: BLOOD RIGHT HAND  Result Value Ref Range Status   Specimen Description BLOOD RIGHT HAND  Final   Special Requests   Final    BOTTLES DRAWN AEROBIC ONLY Blood Culture results may not be optimal due to an inadequate volume of blood received in culture bottles   Culture   Final    NO GROWTH 5 DAYS Performed at [facility], [address], [place], [place] [address]    Report Status 08/27/2022 FINAL  Final  MRSA Next Gen by PCR, Nasal     Status: None   Collection Time: 08/22/22 12:47 PM   Specimen: Nasal Mucosa; Nasal Swab  Result Value Ref Range Status   MRSA by PCR Next Gen NOT DETECTED NOT DETECTED Final    Comment: (NOTE) The GeneXpert MRSA Assay (FDA approved for NASAL specimens only), is one component of a comprehensive MRSA colonization surveillance program. It is not intended to diagnose MRSA infection nor to guide or monitor treatment  for MRSA infections. Test performance is not FDA approved in patients less than 42 years old. Performed at [facility], [address] [place], [place] [address]   Aspergillus Ag, BAL/Serum     Status: None   Collection Time: 08/23/22  6:21 AM   Specimen: Vein; Blood  Result Value Ref Range Status   Aspergillus Ag, BAL/Serum 0.07 0.00 - 0.49 Index Final    Comment: (NOTE) Performed At: [facility] [address] [place], [place] [id number] Rush Farmer MD [id number]   Culture, blood (Routine X 2) w Reflex to ID Panel     Status: None (Preliminary result)   Collection Time: 08/30/22  5:13 PM  Specimen: BLOOD  Result Value Ref Range Status   Specimen Description BLOOD BLOOD RIGHT ARM  Final   Special Requests   Final    BOTTLES DRAWN AEROBIC AND ANAEROBIC Blood Culture adequate volume   Culture   Final    NO GROWTH < 24 HOURS Performed at [facility], [address], [place], [place] [address]    Report Status PENDING  Incomplete  MRSA Next Gen by PCR, Nasal     Status: None   Collection Time: 08/30/22  6:36 PM   Specimen: Nasal Mucosa; Nasal Swab  Result Value Ref Range Status   MRSA by PCR Next Gen NOT DETECTED NOT DETECTED Final    Comment: (NOTE) The GeneXpert MRSA Assay (FDA approved for NASAL specimens only), is one component of a comprehensive MRSA colonization surveillance program. It is not intended to diagnose MRSA infection nor to guide or monitor treatment for MRSA infections. Test performance is not FDA approved in patients less than 54 years old. Performed at [facility], [address] [place], [place] [address]   Culture, blood (Routine X 2) w Reflex to ID Panel     Status: None (Preliminary result)   Collection Time: 08/30/22 10:15 PM   Specimen: BLOOD  Result Value Ref Range Status   Specimen Description BLOOD BLOOD RIGHT HAND  Final   Special Requests   Final    BOTTLES DRAWN AEROBIC AND ANAEROBIC Blood  Culture adequate volume   Culture   Final    NO GROWTH < 12 HOURS Performed at [facility], [address], [place], [place] [address]    Report Status PENDING  Incomplete         Radiology Studies: CT Angio Chest PE W/Cm &/Or Wo Cm  Result Date: 08/30/2022 CLINICAL DATA:  Status post thrombectomy. History of pulmonary embolus. EXAM: CT ANGIOGRAPHY CHEST WITH CONTRAST TECHNIQUE: Multidetector CT imaging of the chest was performed using the standard protocol during bolus administration of intravenous contrast. Multiplanar CT image reconstructions and MIPs were obtained to evaluate the vascular anatomy. RADIATION DOSE REDUCTION: This exam was performed according to the departmental dose-optimization program which includes automated exposure control, adjustment of the mA and/or kV according to patient size and/or use of iterative reconstruction technique. CONTRAST:  48m OMNIPAQUE IOHEXOL 350 MG/ML SOLN COMPARISON:  Chest x-ray earlier same day.  Chest CT 02/02/2023 FINDINGS: Cardiovascular: Heart size upper normal with trace pericardial effusion, similar to prior. No thoracic aortic aneurysm. Enlargement of the pulmonary outflow tract/main pulmonary arteries suggests pulmonary arterial hypertension. Bulky pulmonary embolism again noted bilaterally, occlusive in right middle lobe lobar branch and segmental branches to the right lower lobe. Medial segmental and subsegmental branches to the right lower lobe are patent on the current study despite being occluded previously. Small volume occlusive thrombus in a subsegmental right upper lobe branches similar to prior. No substantial change in appearance of nonocclusive thrombus in segmental branches to the left lower lobe with similar occlusion of some subsegmental left lower lobe pulmonary arteries. RV LV ratio is 2.3 today compared to 1.9 previously. Mediastinum/Nodes: No mediastinal lymphadenopathy. Consolidative airspace disease is seen in the  right hilum. The esophagus has normal imaging features. There is no axillary lymphadenopathy. Lungs/Pleura: Similar patchy ground-glass opacity in both upper lobes. Interval development of confluent patchy and posterior consolidative airspace disease in the right middle lobe suggesting infarct. Cavitary lesions in the lateral and posterior right upper lobe are similar to prior. Nodular and patchy areas of consolidative  airspace disease in the lower lungs again noted, stable to minimally progressive in the interval. No substantial pleural effusion. Upper Abdomen: Visualized portion of the upper abdomen is unremarkable. Musculoskeletal: No worrisome lytic or sclerotic osseous abnormality. Review of the MIP images confirms the above findings. IMPRESSION: 1. Interval development of confluent patchy and posterior consolidative airspace disease in the right middle lobe suggesting infarct. 2. Bulky bilateral pulmonary embolism again noted, occlusive in right middle lobe lobar branch and segmental branches to the right lower lobe, similar to prior. Medial segmental and subsegmental branches to the right lower lobe are patent on the current study despite being occluded previously. Small volume occlusive thrombus in a subsegmental right upper lobe branches similar to prior. 3. No substantial change in appearance of nonocclusive thrombus in segmental branches to the left lower lobe with similar occlusion of some subsegmental left lower lobe pulmonary arteries. 4. Enlargement of the pulmonary outflow tract/main pulmonary arteries suggests pulmonary arterial hypertension. RV/LV ratio is 2.3 today compared to 1.9 previously. 5. Cavitary lesions in the lateral and posterior right upper lobe are similar to prior. 6. Nodular and patchy areas of consolidative airspace disease in the lower lungs again noted, stable to minimally progressive in the interval. Critical Value/emergent results were called by telephone at the time of  interpretation on 08/30/2022 at 7:59 am to provider Dr. Cherylann Banas, who verbally acknowledged these results. Electronically Signed   By: Misty Stanley M.D.   On: 08/30/2022 07:59   DG Chest Port 1 View  Result Date: 08/30/2022 CLINICAL DATA:  Chest pain.  Recent pulmonary thrombectomy. EXAM: PORTABLE CHEST 1 VIEW COMPARISON:  08/22/2022. FINDINGS: Left lung clear. Similar patchy airspace disease in the right mid lung with progressive patchy consolidative opacity at the right base. Right mid and lower lung cavitary lesions again noted, better characterized on recent CT scan. Possible small right pleural effusion. The cardiopericardial silhouette is within normal limits for size. Telemetry leads overlie the chest. IMPRESSION: 1. Cavitary lesions again noted right mid lung and peripheral right lung base. 2. Interval progression of patchy consolidative opacity in the inferior right lung. Electronically Signed   By: Misty Stanley M.D.   On: 08/30/2022 05:46        Scheduled Meds:  budesonide (PULMICORT) nebulizer solution  0.5 mg Nebulization BID   feeding supplement  237 mL Oral BID BM   insulin aspart  0-5 Units Subcutaneous QHS   insulin aspart  0-9 Units Subcutaneous TID WC   insulin aspart  3 Units Subcutaneous TID WC   ipratropium-albuterol  3 mL Nebulization Q4H   Continuous Infusions:  heparin 1,300 Units/hr (08/31/22 1228)   piperacillin-tazobactam (ZOSYN)  IV 3.375 g (08/31/22 0528)     LOS: 1 day    Sidney Ace, MD [facility]   If 7PM-7AM, please contact night-coverage  08/31/2022, 2:02 PM

## 2022-08-31 NOTE — Progress Notes (Signed)
ANTICOAGULATION CONSULT NOTE - Initial Consult  Pharmacy Consult for Heparin Infusion  Indication: pulmonary embolus  No Known Allergies  Patient Measurements: Height: '4\' 11"'$  (149.9 cm) Weight: 58.3 kg (128 lb 9.6 oz) IBW/kg (Calculated) : 43.2   Vital Signs: Temp: 98.1 F (36.7 C) (02/29 0211) Temp Source: Oral (02/29 0211) BP: 115/69 (02/29 0211) Pulse Rate: 88 (02/29 0211)  Labs: Recent Labs    08/30/22 0527 08/30/22 0648 08/30/22 1512 08/30/22 2215 08/31/22 0449  HGB 10.4*  --   --   --  8.9*  HCT 32.8*  --   --   --  27.8*  PLT 518*  --   --   --  451*  APTT 33  --  84* 53* 77*  LABPROT 17.1*  --   --   --   --   INR 1.4*  --   --   --   --   HEPARINUNFRC >1.10*  --   --   --  0.22*  CREATININE 1.09*  --   --   --  0.99  TROPONINIHS 17 22*  --   --   --      Estimated Creatinine Clearance: 69.2 mL/min (by C-G formula based on SCr of 0.99 mg/dL).   Medical History: Past Medical History:  Diagnosis Date   Diabetes mellitus without complication ([place])    Dyslipidemia    Hypoalbuminemia    Marijuana abuse    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   Nephrotic syndrome 08/23/2019   Tobacco dependence     Medications:  Apixaban (Eliquis)- Last dose reported 2/27 PM   Assessment: 22 yo female with worsening right upper chest pain and shortness of breath. Patient recently diagnosed w/extensive PE S/P thrombectomy and started on Eliquis. Pharmacy has been consulted for heparin infusion dosing and monitoring for PE.   Baseline heparin level elevated.   Goal of Therapy:  Heparin level 0.3-0.7 units/ml aPTT 66-102 seconds Monitor platelets by anticoagulation protocol: Yes   Plan:  2/29 @ 0449:  aPTT = 77,  HL = 0.22 aPTT is therapeutic X 1 ,  HL is SUBtherapeutic - Will recheck aPTT and HL in 6 hrs on 2/29 @ 1100.  Will need to adjust heparin infusion by aPTT levels until aPPT and HL (anti-Xa level) correlate.  Continue to monitor H&H and  platelets   Westlee Devita D, PharmD Clinical Pharmacist 08/31/2022 6:04 AM

## 2022-08-31 NOTE — Inpatient Diabetes Management (Signed)
Inpatient Diabetes Program Recommendations  AACE/ADA: New Consensus Statement on Inpatient Glycemic Control (2015)  Target Ranges:  Prepandial:   less than 140 mg/dL      Peak postprandial:   less than 180 mg/dL (1-2 hours)      Critically ill patients:  140 - 180 mg/dL   Lab Results  Component Value Date   GLUCAP 226 (H) 08/31/2022   HGBA1C 9.1 (H) 08/25/2022    Review of Glycemic Control  Latest Reference Range & Units 08/30/22 07:11 08/30/22 12:24 08/30/22 17:49 08/30/22 21:37 08/31/22 08:19  Glucose-Capillary 70 - 99 mg/dL 186 (H) 154 (H) 145 (H) 270 (H) 226 (H)  (H): Data is abnormally high  Diabetes history: DM1(does not make insulin.  Needs correction, basal and meal coverage)   Outpatient Diabetes medications:  Lantus 20 units QHS, Novolog 1 units for every 8 carbs, Novolog 1 units for every 50 mg/dL >150 mg/dL [facility] endocrinology   Current orders for Inpatient glycemic control: Novolog 0-9 units TID and 0-5 units QHS, Novolog 3 units TID with meals  Inpatient Diabetes Program Recommendations:    Semglee 16 units QD (70% of home dose)  Spoke with patient on the phone.  She confirms above home medications.    Will continue to follow while inpatient.  Thank you, Reche Dixon, MSN, [place] Diabetes Coordinator Inpatient Diabetes Program [phone] (team pager from 8a-5p)

## 2022-08-31 NOTE — Progress Notes (Signed)
ANTICOAGULATION CONSULT NOTE  Pharmacy Consult for Heparin  Indication: pulmonary embolus  No Known Allergies  Patient Measurements: Height: '4\' 11"'$  (149.9 cm) Weight: 58.3 kg (128 lb 9.6 oz) IBW/kg (Calculated) : 43.2 Heparin DW: 55.3 kg  Vital Signs: Temp: 99.2 F (37.3 C) (02/29 1639) BP: 99/69 (02/29 1639) Pulse Rate: 100 (02/29 1639)  Labs: Recent Labs    08/30/22 0527 08/30/22 0648 08/30/22 1512 08/31/22 0449 08/31/22 1110 08/31/22 2012  HGB 10.4*  --   --  8.9*  --   --   HCT 32.8*  --   --  27.8*  --   --   PLT 518*  --   --  451*  --   --   APTT 33  --    < > 77* 64* 61*  LABPROT 17.1*  --   --   --   --   --   INR 1.4*  --   --   --   --   --   HEPARINUNFRC >1.10*  --   --  0.22* 0.17*  --   CREATININE 1.09*  --   --  0.99  --   --   TROPONINIHS 17 22*  --   --   --   --    < > = values in this interval not displayed.     Estimated Creatinine Clearance: 69.2 mL/min (by C-G formula based on SCr of 0.99 mg/dL).   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 female with medical history significant of multiple medical issues including type 1 diabetes poorly controlled, hypoalbuminemia, stage III CKD, nephrotic syndrome, tobacco abuse, marijuana abuse presenting with recurrent pulmonary embolus with infarcts. Pt was on apixaban PTA.   2/29 1110 HL 0.17  2/29 2012 aPTT 61     Goal of Therapy:  Heparin level 0.3-0.7 units/ml aPTT 66-102 seconds Monitor platelets by anticoagulation protocol: Yes   Plan:  Heparin level is not falsely elevated anymore. aPTT is slightly subtherapeutic. Will give a heparin bolus of 800 units x 1 and increase heparin infusion to  1400 units/hr. Recheck aPTT and heparin level and CBC in 6 hours. Probably can switch to heparin level monitoring.    Oswald Hillock, PharmD, BCPS Clinical Pharmacist 08/31/2022 8:40 PM

## 2022-08-31 NOTE — Progress Notes (Signed)
Pt was unable to productive sputum for AFB culture post nebulizer treatment.

## 2022-08-31 NOTE — Progress Notes (Addendum)
[facility]  ROUNDING NOTE   Subjective:   [name] was admitted to [facility] on 08/30/2022 for Pulmonary embolism ([place]) [I26.99] Hypoglycemia [E16.2] History of nephrotic syndrome [Z87.441] Type 1 diabetes mellitus with other specified complication ([place]) 123XX123 Chest pain, unspecified type [R07.9] Pulmonary embolism, unspecified chronicity, unspecified pulmonary embolism type, unspecified whether acute cor pulmonale present [facility]) [I26.99]  Patient is known to our practice and was seen during previous admissions. She returns complaining of shortness of breath, similar to last presentation. She states was also having chest pain with deep breaths. Patient seen laying in bed, family at bedside.  Appetite remains poor.  No lower extremity edema.  Currently on 4 L nasal cannula, room air at baseline.  States she is maintained on medication since previous discharge.  Labs significant for bicarb 19, glucose 53, creatinine 1.09, calcium 7.0, BNP 317, WBCs 24.2 and hemoglobin 10.4.  Portable chest x-ray shows cavitary lesions in right midlung and peripheral right lung base with patchy opacities.  Angio chest CT shows bulky pulmonary embolisms noted in the right mid and lower lobe.  We have been consulted to monitor nephrotic syndrome.  Objective:  Vital signs in last 24 hours:  Temp:  [97.8 F (36.6 C)-99.3 F (37.4 C)] 98.2 F (36.8 C) (02/29 1156) Pulse Rate:  [88-109] 98 (02/29 1200) Resp:  [18-20] 18 (02/29 1156) BP: (80-115)/(58-72) 96/69 (02/29 1156) SpO2:  [91 %-100 %] 96 % (02/29 1259)  Weight change:  Filed Weights   08/30/22 0439 08/30/22 0559  Weight: 64.5 kg 58.3 kg    Intake/Output: I/O last 3 completed shifts: In: 340 [P.O.:240; IV Piggyback:100] Out: -    Intake/Output this shift:  No intake/output data recorded.  Physical Exam: General: NAD  Head: Normocephalic, atraumatic. Moist oral mucosal membranes, facial edema  Eyes: Anicteric   Lungs:  Clear to auscultation, NCO 2  Heart: Regular rate and rhythm  Abdomen:  Soft, nontender  Extremities:  no peripheral edema.  Neurologic: Nonfocal, moving all four extremities  Skin: No lesions        Basic Metabolic Panel: Recent Labs  Lab 08/25/22 0404 08/30/22 0527 08/31/22 0449  NA 131* 136 134*  K 4.5 3.6 4.2  CL 103 107 108  CO2 22 19* 23  GLUCOSE 324* 53* 238*  BUN 42* 29* 19  CREATININE 1.49* 1.09* 0.99  CALCIUM 7.2* 7.0* 7.2*  MG 2.0  --  1.6*  PHOS 3.0  --  3.2     Liver Function Tests: Recent Labs  Lab 08/31/22 0449  AST 11*  ALT 10  ALKPHOS 97  BILITOT 0.3  PROT 4.3*  ALBUMIN <1.5*    No results for input(s): "LIPASE", "AMYLASE" in the last 168 hours. No results for input(s): "AMMONIA" in the last 168 hours.  CBC: Recent Labs  Lab 08/25/22 0404 08/30/22 0527 08/31/22 0449  WBC 15.4* 24.2* 24.2*  HGB 9.6* 10.4* 8.9*  HCT 28.7* 32.8* 27.8*  MCV 85.2 88.6 88.3  PLT 285 518* 451*     Cardiac Enzymes: No results for input(s): "CKTOTAL", "CKMB", "CKMBINDEX", "TROPONINI" in the last 168 hours.  BNP: Invalid input(s): "POCBNP"  CBG: Recent Labs  Lab 08/30/22 1224 08/30/22 1749 08/30/22 2137 08/31/22 0819 08/31/22 1157  GLUCAP 154* 145* 270* 226* 221*     Microbiology: Results for orders placed or performed during the hospital encounter of 08/30/22  Culture, blood (Routine X 2) w Reflex to ID Panel     Status: None (Preliminary result)  Collection Time: 08/30/22  5:13 PM   Specimen: BLOOD  Result Value Ref Range Status   Specimen Description BLOOD BLOOD RIGHT ARM  Final   Special Requests   Final    BOTTLES DRAWN AEROBIC AND ANAEROBIC Blood Culture adequate volume   Culture   Final    NO GROWTH < 24 HOURS Performed at [facility], [address] [place], [place] [address]    Report Status PENDING  Incomplete  MRSA Next Gen by PCR, Nasal     Status: None   Collection Time: 08/30/22  6:36 PM   Specimen:  Nasal Mucosa; Nasal Swab  Result Value Ref Range Status   MRSA by PCR Next Gen NOT DETECTED NOT DETECTED Final    Comment: (NOTE) The GeneXpert MRSA Assay (FDA approved for NASAL specimens only), is one component of a comprehensive MRSA colonization surveillance program. It is not intended to diagnose MRSA infection nor to guide or monitor treatment for MRSA infections. Test performance is not FDA approved in patients less than 58 years old. Performed at [facility], [address] [place], [place] [address]   Culture, blood (Routine X 2) w Reflex to ID Panel     Status: None (Preliminary result)   Collection Time: 08/30/22 10:15 PM   Specimen: BLOOD  Result Value Ref Range Status   Specimen Description BLOOD BLOOD RIGHT HAND  Final   Special Requests   Final    BOTTLES DRAWN AEROBIC AND ANAEROBIC Blood Culture adequate volume   Culture   Final    NO GROWTH < 12 HOURS Performed at [facility], [address], [place], [place] [address]    Report Status PENDING  Incomplete    Coagulation Studies: Recent Labs    08/30/22 0527  LABPROT 17.1*  INR 1.4*     Urinalysis: No results for input(s): "COLORURINE", "LABSPEC", "PHURINE", "GLUCOSEU", "HGBUR", "BILIRUBINUR", "KETONESUR", "PROTEINUR", "UROBILINOGEN", "NITRITE", "LEUKOCYTESUR" in the last 72 hours.  Invalid input(s): "APPERANCEUR"     Imaging: CT Angio Chest PE W/Cm &/Or Wo Cm  Result Date: 08/30/2022 CLINICAL DATA:  Status post thrombectomy. History of pulmonary embolus. EXAM: CT ANGIOGRAPHY CHEST WITH CONTRAST TECHNIQUE: Multidetector CT imaging of the chest was performed using the standard protocol during bolus administration of intravenous contrast. Multiplanar CT image reconstructions and MIPs were obtained to evaluate the vascular anatomy. RADIATION DOSE REDUCTION: This exam was performed according to the departmental dose-optimization program which includes automated exposure control,  adjustment of the mA and/or kV according to patient size and/or use of iterative reconstruction technique. CONTRAST:  65m OMNIPAQUE IOHEXOL 350 MG/ML SOLN COMPARISON:  Chest x-ray earlier same day.  Chest CT 02/02/2023 FINDINGS: Cardiovascular: Heart size upper normal with trace pericardial effusion, similar to prior. No thoracic aortic aneurysm. Enlargement of the pulmonary outflow tract/main pulmonary arteries suggests pulmonary arterial hypertension. Bulky pulmonary embolism again noted bilaterally, occlusive in right middle lobe lobar branch and segmental branches to the right lower lobe. Medial segmental and subsegmental branches to the right lower lobe are patent on the current study despite being occluded previously. Small volume occlusive thrombus in a subsegmental right upper lobe branches similar to prior. No substantial change in appearance of nonocclusive thrombus in segmental branches to the left lower lobe with similar occlusion of some subsegmental left lower lobe pulmonary arteries. RV LV ratio is 2.3 today compared to 1.9 previously. Mediastinum/Nodes: No mediastinal lymphadenopathy. Consolidative airspace disease is seen in the right hilum. The esophagus has normal imaging features. There  is no axillary lymphadenopathy. Lungs/Pleura: Similar patchy ground-glass opacity in both upper lobes. Interval development of confluent patchy and posterior consolidative airspace disease in the right middle lobe suggesting infarct. Cavitary lesions in the lateral and posterior right upper lobe are similar to prior. Nodular and patchy areas of consolidative airspace disease in the lower lungs again noted, stable to minimally progressive in the interval. No substantial pleural effusion. Upper Abdomen: Visualized portion of the upper abdomen is unremarkable. Musculoskeletal: No worrisome lytic or sclerotic osseous abnormality. Review of the MIP images confirms the above findings. IMPRESSION: 1. Interval  development of confluent patchy and posterior consolidative airspace disease in the right middle lobe suggesting infarct. 2. Bulky bilateral pulmonary embolism again noted, occlusive in right middle lobe lobar branch and segmental branches to the right lower lobe, similar to prior. Medial segmental and subsegmental branches to the right lower lobe are patent on the current study despite being occluded previously. Small volume occlusive thrombus in a subsegmental right upper lobe branches similar to prior. 3. No substantial change in appearance of nonocclusive thrombus in segmental branches to the left lower lobe with similar occlusion of some subsegmental left lower lobe pulmonary arteries. 4. Enlargement of the pulmonary outflow tract/main pulmonary arteries suggests pulmonary arterial hypertension. RV/LV ratio is 2.3 today compared to 1.9 previously. 5. Cavitary lesions in the lateral and posterior right upper lobe are similar to prior. 6. Nodular and patchy areas of consolidative airspace disease in the lower lungs again noted, stable to minimally progressive in the interval. Critical Value/emergent results were called by telephone at the time of interpretation on 08/30/2022 at 7:59 am to provider Dr. Cherylann Banas, who verbally acknowledged these results. Electronically Signed   By: Misty Stanley M.D.   On: 08/30/2022 07:59   DG Chest Port 1 View  Result Date: 08/30/2022 CLINICAL DATA:  Chest pain.  Recent pulmonary thrombectomy. EXAM: PORTABLE CHEST 1 VIEW COMPARISON:  08/22/2022. FINDINGS: Left lung clear. Similar patchy airspace disease in the right mid lung with progressive patchy consolidative opacity at the right base. Right mid and lower lung cavitary lesions again noted, better characterized on recent CT scan. Possible small right pleural effusion. The cardiopericardial silhouette is within normal limits for size. Telemetry leads overlie the chest. IMPRESSION: 1. Cavitary lesions again noted right mid lung  and peripheral right lung base. 2. Interval progression of patchy consolidative opacity in the inferior right lung. Electronically Signed   By: Misty Stanley M.D.   On: 08/30/2022 05:46     Medications:    heparin 1,300 Units/hr (08/31/22 1228)   piperacillin-tazobactam (ZOSYN)  IV 3.375 g (08/31/22 0528)     budesonide (PULMICORT) nebulizer solution  0.5 mg Nebulization BID   feeding supplement  237 mL Oral BID BM   insulin aspart  0-5 Units Subcutaneous QHS   insulin aspart  0-9 Units Subcutaneous TID WC   insulin aspart  3 Units Subcutaneous TID WC   ipratropium-albuterol  3 mL Nebulization Q4H   benzonatate, chlorpheniramine-HYDROcodone, HYDROmorphone (DILAUDID) injection, sodium chloride  Assessment/ Plan:  [name] is a 22 y.o. 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 Most recent urine with 5.6 grams of proteinuria on 07/13/2022. With hypoalbuminemia Continue Prednisone and Tacrolimus at discharge. Will order protein creatinine ratio.  Acute pulmonary embolism: with hypoalbuminemia and nephrotic syndrome. Was discharged on apixaban.   Currently receiving heparin drip. Considering IVC filter however patient too unstable at this time.  Hypotension: Continue supportive care.   Secondary Hyperparathyroidism: not currently on an activated vitamin D.    LOS: 1 [place] 2/29/20241:48 PM

## 2022-08-31 NOTE — Consult Note (Signed)
NAME:  Catherine [name], MRN:  [id number], DOB:  03-26-2001, LOS: 1 ADMISSION DATE:  08/30/2022, CONSULTATION DATE:  08/22/2022 REFERRING MD:  Karmen Bongo, MD,  CHIEF COMPLAINT:  Cavitary Pulmonary Lesions, PE  History of Present Illness:  22 year old female with a past medical history of CKD secondary to nephrotic syndrome from minimal change disease and diabetic nephropathy. She presents to the hospital with shortness of breath 8 days ago, diagnosed with PE/RV strain, RUL cavitary lesions RLL opacity  2/20 admission Patient presents for sudden onset increase in shortness of breath of a few days in duration. She reports her symptoms started around Saturday. She reports some associated dizziness but denies loss of consciousness. This is associated with an increased cough productive of greenish sputum over the past 3 to 4 days. She reports a dry cough for over 3 to 4 weeks.  She was started on Eliquis 07/06/2022 following an admission to [facility] (06/30/2022 - 07/05/2022) for AKI and reportedly ran out of it. She is followed by nephrology outpatient for minimal change disease. Patient is maintained on prednisone and tacrolimus for immune suppression for her minimal change disease, and had previously received Rituximab but is not on it. QuantGOLD sent on 07/05/2022 was negative. She was also started on Bactrim for prophylaxis. S/p THROMBECTOMY  2/20 admission discharged on ABX and oral Eliquis  Patient lives with her grandfather. 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 Her grandfather has a dog that stays outside. She has no birds and no other exposures. Patient reports vaping marijuana products as well as nicotine products.  2/20 CT chest      2/28 ADMISSION with progressive SOB and DOE and cough  2/28 CT Chest    Patient was seen by [place], SEEMS PE ARE AT [place] 2/20 but current CT scan shows progression of RLL  opacity  I evaluated patient for [place] INFECTION Patient is very SOB, very weak, bad cough-SHE WILL NOT TOLERATE PROCEDURE AT THIS TIME  PARENTS IN AGREEMENT   INTERVAL CHANGES SOB AND COUGH SEEMS BETTER LESS SOB FEELS BETTER PARENTS AT BEDSIDE    Review of Systems  Review of Systems  Constitutional:  Negative for chills, fever and weight loss.  Respiratory:  Positive for cough, sputum production and shortness of breath. Negative for hemoptysis and wheezing.   Cardiovascular:  Negative for chest pain.  Skin:  Negative for rash.  Neurological:  Positive for dizziness. Negative for weakness.     Objective   Blood pressure 96/69, pulse 98, temperature 98.2 F (36.8 C), resp. rate 18, height '4\' 11"'$  (1.499 m), weight 58.3 kg, SpO2 96 %.        Intake/Output Summary (Last 24 hours) at 08/31/2022 1336 Last data filed at 08/31/2022 0538 Gross per 24 hour  Intake 340 ml  Output --  Net 340 ml    Filed Weights   08/30/22 0439 08/30/22 0559  Weight: 64.5 kg 58.3 kg    Examination: Physical Exam Constitutional:      General: She is not in acute distress.    Appearance: She is well-developed. She is not ill-appearing.  HENT:     Head: Normocephalic.     Mouth/Throat:     Mouth: Mucous membranes are moist.  Cardiovascular:     Rate and Rhythm: Normal rate and regular rhythm.     Heart sounds: Normal heart sounds.  Pulmonary:  Effort: No tachypnea.     Breath sounds: No wheezing, rhonchi or rales.  Abdominal:     Palpations: Abdomen is soft.  Musculoskeletal:     Cervical back: Normal range of motion.  Neurological:     Mental Status: She is oriented to person, place, and time.      Past Medical History:  She,  has a past medical history of Diabetes mellitus without complication ([place]), Dyslipidemia, Hypoalbuminemia, Marijuana abuse, Minimal change disease (08/23/2019), Nephrotic syndrome (08/23/2019), and Tobacco dependence.    Surgical History:   Past Surgical History:  Procedure Laterality Date   PULMONARY THROMBECTOMY Bilateral 08/22/2022   Procedure: PULMONARY THROMBECTOMY;  Surgeon: Algernon Huxley, MD;  Location: [facility];  Service: Cardiovascular;  Laterality: Bilateral;     Social History:   reports that she has been smoking e-cigarettes. She has never used smokeless tobacco. She reports that she does not currently use alcohol. She reports current drug use. Drug: Marijuana.   Family History:  Her family history is not on file.   Allergies No Known Allergies   Home Medications  Prior to Admission medications   Medication Sig Start Date End Date Taking? Authorizing Provider  apixaban (ELIQUIS) 5 MG TABS tablet Take 5 mg by mouth 2 (two) times daily. 07/05/22  Yes [provider]  glucagon 1 MG injection Inject 1 mg into the muscle as needed. 02/26/19  Yes [provider]  insulin aspart (NOVOLOG) 100 UNIT/ML injection Inject 5-8 Units into the skin 3 (three) times daily before meals. 09/06/21 08/22/22 Yes Enzo Bi, MD  LANTUS 100 UNIT/ML injection Inject 0.3 mLs (30 Units total) into the skin at bedtime. 09/06/21 08/22/22 Yes Enzo Bi, MD  tacrolimus (PROGRAF) 1 MG capsule Take 2 mg by mouth 2 (two) times daily.   Yes [provider]  Vitamin D, Ergocalciferol, (DRISDOL) 1.25 MG (50000 UNIT) CAPS capsule Take 50,000 Units by mouth every 7 (seven) days. 07/10/22 07/10/23 Yes [provider]  acetaminophen (TYLENOL) 325 MG tablet Take 325 mg by mouth every 6 (six) hours as needed. Patient not taking: Reported on 09/26/2021 01/14/17   [provider]  atorvastatin (LIPITOR) 40 MG tablet Take 1 tablet (40 mg total) by mouth daily. Patient not taking: Reported on 08/22/2022 09/06/21 12/05/21  Enzo Bi, MD  blood glucose meter kit and supplies Dispense based on patient and insurance preference. Use up to four times daily as directed. (FOR ICD-10 E10.9, E11.9). 09/06/21    Enzo Bi, MD  sulfamethoxazole-trimethoprim (BACTRIM DS) 800-160 MG tablet Take 1 tablet by mouth 3 (three) times a week. Patient not taking: Reported on 08/22/2022 07/07/22   [provider]  torsemide (DEMADEX) 20 MG tablet Take 1 tablet (20 mg total) by mouth daily. 10/01/21 10/31/21  Val Riles, MD      Assessment & Plan:   Cavitary Pulmonary Lesions RLL opacity  Chest CT on my review is notable for multiple foci of cavitary lesions in addition to mosaic attenuation with ground glass opacities. The differential includes infectious etiologies (bacterial, AFB, fungal, septic emboli)   The mosaic attenuation is possible in settings of pulmonary embolism but is non-specific and can be seen in infectious and inflammatory conditions. Other notable finding on my review of the CT is reticulation in the lower lobes, more so in the right lower lobe, that appears to be present on previous imaging (abdominal CT's performed in 2021 and 2023)   AT THIS TIME PATIENT WOULD BE HIGH RISK FOR  BRONCH AND PATIENT'S PARENTS ALSO DO NOT WANT BRONCHOSCOPY   Obtain Sputum Cultures AFB, Fungal sputum cultures ID consulted-started zosyn PROCAL <0.10 -follow up respiratory and blood cultures  Submassive Pulmonary Embolus no changes Continue heparin infusion -lifelong anti-coagulation recommended in setting of MCG nephrotic syndrome  CONTINUE IV ABX OXYGEN AS NEEDED SPUTUMS SAMPLES FOR TESTING  BD therapy Has Helped the MOST  Family updated  at bedside(mother,father)    Corrin Parker, M.D.  [facility]  Medical Director Wingate Director [place] Department

## 2022-08-31 NOTE — Progress Notes (Signed)
ANTICOAGULATION CONSULT NOTE  Pharmacy Consult for Heparin Infusion  Indication: pulmonary embolus  No Known Allergies  Patient Measurements: Height: '4\' 11"'$  (149.9 cm) Weight: 58.3 kg (128 lb 9.6 oz) IBW/kg (Calculated) : 43.2 Heparin DW: 55.3 kg  Vital Signs: Temp: 98.2 F (36.8 C) (02/29 1156) Temp Source: Oral (02/29 0211) BP: 96/69 (02/29 1156) Pulse Rate: 101 (02/29 1156)  Labs: Recent Labs    08/30/22 0527 08/30/22 0648 08/30/22 1512 08/30/22 2215 08/31/22 0449 08/31/22 1110  HGB 10.4*  --   --   --  8.9*  --   HCT 32.8*  --   --   --  27.8*  --   PLT 518*  --   --   --  451*  --   APTT 33  --    < > 53* 77* 64*  LABPROT 17.1*  --   --   --   --   --   INR 1.4*  --   --   --   --   --   HEPARINUNFRC >1.10*  --   --   --  0.22* 0.17*  CREATININE 1.09*  --   --   --  0.99  --   TROPONINIHS 17 22*  --   --   --   --    < > = values in this interval not displayed.     Estimated Creatinine Clearance: 69.2 mL/min (by C-G formula based on SCr of 0.99 mg/dL).   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 Patient recently diagnosed w/extensive PE S/P thrombectomy and started on Eliquis. Pharmacy has been consulted for heparin infusion dosing and monitoring for PE.   Baseline heparin level elevated.   Goal of Therapy:  Heparin level 0.3-0.7 units/ml aPTT 66-102 seconds Monitor platelets by anticoagulation protocol: Yes   Plan: 2/29 @ 1110 aPTT 64 sec, subtherapeutic --Give heparin 800 units IV x 1 --Increase heparin drip to 1300 units/hr --Recheck aPTT 6 hours after rate adjustment --Continue to  adjust based on aPTT until correlation with HL. --CBC and HL with AM labs  Lorin Picket, PharmD Clinical Pharmacist 08/31/2022 12:01 PM

## 2022-08-31 NOTE — TOC Initial Note (Addendum)
Transition of Care [facility]) - Initial/Assessment Note    Patient Details  Name: Catherine [name] MRN: [id number] Date of Birth: 2001/04/03  [facility]) CM/SW Contact:    Candie Chroman, LCSW Phone Number: 08/31/2022, 11:38 AM  Clinical Narrative:   TOC completed readmission prevention screen on 2/23: "CSW spoke with pt regarding readmission screen. Pt reports she lives with her grandfather and feels she will be safe to go back. She reports she goes to [facility] and sees a MD there. She had an appointment a few months ago. Pt states that she is currently working at [employer] and has no transportation barriers. Pt spoke to about her Elliquis cost and pt feels she is able to afford and pick it up at discharge. Pt reports that she would like her medications sent to [facility] in [place]. Pt reports no additional needs and has orders in to discharge."  Patient currently on acute oxygen. Will follow for this potential home need.               3:59 pm: CSW acknowledges consult for medication assistance. Patient has insurance so unable to get her prescriptions through Medication Management program. RN is aware.  Expected Discharge Plan: Home/Self Care Barriers to Discharge: Continued Medical Work up   Patient Goals and CMS Choice            Expected Discharge Plan and Services     Post Acute Care Choice: NA Living arrangements for the past 2 months: Apartment                                      Prior Living Arrangements/Services Living arrangements for the past 2 months: Apartment   Patient language and need for interpreter reviewed:: Yes              Criminal Activity/Legal Involvement Pertinent to Current Situation/Hospitalization: No - Comment as needed  Activities of Daily Living Home Assistive Devices/Equipment: Eyeglasses ADL Screening (condition at time of admission) Patient's cognitive ability adequate to safely complete  daily activities?: Yes Is the patient deaf or have difficulty hearing?: No Does the patient have difficulty seeing, even when wearing glasses/contacts?: No Does the patient have difficulty concentrating, remembering, or making decisions?: No Patient able to express need for assistance with ADLs?: Yes Does the patient have difficulty dressing or bathing?: No Independently performs ADLs?: Yes (appropriate for developmental age) Does the patient have difficulty walking or climbing stairs?: No Weakness of Legs: None Weakness of Arms/Hands: None  Permission Sought/Granted                  Emotional Assessment       Orientation: : Oriented to Self, Oriented to Place, Oriented to  Time, Oriented to Situation Alcohol / Substance Use: Not Applicable Psych Involvement: No (comment)  Admission diagnosis:  Pulmonary embolism ([place]) [I26.99] Hypoglycemia [E16.2] History of nephrotic syndrome [Z87.441] Type 1 diabetes mellitus with other specified complication ([place]) [id number] Chest pain, unspecified type [R07.9] Pulmonary embolism, unspecified chronicity, unspecified pulmonary embolism type, unspecified whether acute cor pulmonale present ([place]) [I26.99] Patient Active Problem List   Diagnosis Date Noted   Pulmonary embolism ([place]) 08/30/2022   Cavitary lesion of lung 08/30/2022   Leukocytosis 08/30/2022   Pulmonary embolism with acute cor pulmonale ([place]) 08/22/2022   Hypoalbuminemia 08/22/2022   Marijuana abuse 08/22/2022   Tobacco dependence 08/22/2022   Abdominal  pain 09/26/2021   Hypokalemia 09/26/2021   Dyslipidemia 09/26/2021   HCAP (healthcare-associated pneumonia) 09/26/2021   Severe sepsis with septic shock (CODE) ([place]) 09/26/2021   Hypomagnesemia 09/26/2021   Pseudohyponatremia 09/04/2021   HTN (hypertension) 02/10/2021   DKA (diabetic ketoacidosis) ([place]) 04/18/2020   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 ([place]) [id number]   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    Proteinuria 08/23/2019   DM (diabetes mellitus) type I uncontrolled with renal manifestation 08/23/2019   AKI (acute kidney injury) ([place]) 08/21/2019   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    Hyponatremia    Acidosis    Type 1 diabetes mellitus with hyperglycemia ([place]) 10/12/2005   PCP:  [place]:   [facility] [place], [place] [address] [facility] [address] [place] [address] Phone: [phone] Fax: [phone]     Social Determinants of Health (SDOH) Social History: SDOH Screenings   Food Insecurity: No Food Insecurity (08/30/2022)  Housing: Low Risk  (08/30/2022)  Transportation Needs: No Transportation Needs (08/30/2022)  Utilities: Not At Risk (08/30/2022)  Tobacco Use: High Risk (08/30/2022)   SDOH Interventions:     Readmission Risk Interventions    08/31/2022   11:37 AM 08/25/2022    9:46 AM 09/27/2021    4:09 PM  Readmission Risk Prevention Plan  Transportation Screening Complete Complete Complete  PCP or Specialist Appt within 3-5 Days   Complete  HRI or [place]   Complete  Social Work Consult for [place] Planning/Counseling   Complete  Palliative Care Screening   Not Applicable  Medication Review Press photographer) Complete Complete Referral to Pharmacy  PCP or Specialist appointment within 3-5 days of discharge Complete Complete   HRI or [place]  Complete   SW Recovery Care/Counseling Consult Complete Complete   Palliative Care Screening Not Applicable Not [place] Not Applicable Not Applicable

## 2022-08-31 NOTE — Consult Note (Signed)
NAME: Catherine [name]  DOB: 11-09-2000  MRN: [id number]  Date/Time: 08/31/2022 2:24 PM  REQUESTING PROVIDER: Temple Pacini Subjective:  REASON FOR CONSULT: pulmonary infiltrate ? Catherine [name] is a 22 y.o. with a history of CKD due to Nephrotic syndrome from MCD, DM, HTN is admitted with worsening sob and rt sided back pain and chest pain on 08/30/22 Pt was recently in [facility] between 2/20-2/23/24 with similar complaints and diagnosed with extensive PE and underwent mechanical thrombectomy and was on IV heparin and  was sent on eliquis. She also had small cavitating lesions and had work up by pulmonologist to r/o OP - fungal screen was neg- beta d glucan was minimally elevated at 68 ( normal < 61)  In the ED vitals   08/30/22  BP 115/70  Temp 98.1 F (36.7 C)  Pulse Rate 95  Resp 20  SpO2 100 %  O2 Flow Rate (L/min) 4 L/min  Weight 128 lb 9.6 oz    Latest Reference Range & Units 08/30/22  WBC 4.0 - 10.5 K/uL 24.2 (H)  Hemoglobin 12.0 - 15.0 g/dL 10.4 (L)  HCT 36.0 - 46.0 % 32.8 (L)  Platelets 150 - 400 K/uL 518 (H)  Creatinine 0.44 - 1.00 mg/dL 1.09 (H)   CT angio of chest showed bulky b/l PE, occlusive middle lobe lobar branch and segmental branches to the rt lower lobe as before.Medial segmental and subsegmental branches to the right lower lobe are patent on the current study despite being occluded previously. Small volume occlusive thrombus in a subsegmental right upper lobe branches similar to prior. Left side is non occlusive thrombus. Cavitary lesions in the lateral and posterior right upper lobe  similar to prior.  Nodular and patchy areas of consolidative airspace disease in the lower lungs again noted, stable to minimally progressive in the interval. 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   Nephrotic syndrome 08/23/2019   Tobacco dependence     Past Surgical History:  Procedure Laterality Date   PULMONARY THROMBECTOMY Bilateral 08/22/2022   Procedure: PULMONARY THROMBECTOMY;  Surgeon: Algernon Huxley, MD;  Location: [facility];  Service: Cardiovascular;  Laterality: Bilateral;    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  Tobacco Use   Smoking status: Every Day    Types: E-cigarettes   Smokeless tobacco: Never  Vaping Use   Vaping Use: Never used  Substance and Sexual Activity   Alcohol use: Not Currently   Drug use: Yes    Types: Marijuana    Comment: daily use   Sexual activity: Not Currently  Other Topics Concern   Not on file  Social History Narrative   Not on file   Social Determinants of Health   Financial Resource Strain:  Not on file  Food Insecurity: No Food Insecurity (08/30/2022)   Hunger Vital Sign    Worried About Running Out of Food in the Last Year: Never true    Ran Out of Food in the Last Year: Never true  Transportation Needs: No Transportation Needs (08/30/2022)   PRAPARE - Hydrologist (Medical): No    Lack of Transportation (Non-Medical): No  Physical Activity: Not on file  Stress: Not on file  Social Connections: Not on file  Intimate Partner Violence: Not At Risk (08/30/2022)   Humiliation, Afraid, Rape, and Kick questionnaire    Fear of Current or Ex-Partner: No    Emotionally Abused: No    Physically Abused: No    Sexually Abused: No     History reviewed. No pertinent family history. No Known Allergies I? Current Facility-Administered Medications  Medication Dose Route Frequency Provider Last Rate Last Admin   benzonatate (TESSALON) capsule 200 mg  200 mg Oral TID PRN Deneise Lever, MD   200 mg at 08/30/22 1308   budesonide (PULMICORT) nebulizer solution 0.5 mg  0.5 mg Nebulization BID Flora Lipps, MD   0.5 mg at 08/31/22 0757   chlorpheniramine-HYDROcodone (TUSSIONEX) 10-8 MG/5ML suspension 5 mL  5 mL Oral Q12H PRN Deneise Lever, MD       feeding supplement (ENSURE ENLIVE / ENSURE PLUS) liquid 237 mL  237 mL Oral BID BM Colon Flattery, NP   237 mL at 08/31/22 1227   heparin ADULT infusion 100 units/mL (25000 units/212m)  1,300 Units/hr Intravenous Continuous GLorin Picket RPH 13 mL/hr at 08/31/22 1228 1,300 Units/hr at 08/31/22 1228   HYDROmorphone (DILAUDID) injection 0.5 mg  0.5 mg Intravenous Q4H PRN NDeneise Lever MD   0.5 mg at 08/31/22 1422   insulin aspart (novoLOG) injection 0-5 Units  0-5 Units Subcutaneous QHS NDeneise Lever MD   3 Units at 08/30/22 2155   insulin aspart (novoLOG) injection 0-9 Units  0-9 Units Subcutaneous TID WC NDeneise Lever MD   3 Units at 08/31/22 1227   insulin aspart (novoLOG) injection 3 Units  3 Units Subcutaneous TID WC NDeneise Lever MD   3 Units at 08/31/22 1227   ipratropium-albuterol (DUONEB) 0.5-2.5 (3) MG/3ML nebulizer solution 3 mL  3 mL Nebulization Q4H KFlora Lipps MD   3 mL at 08/31/22 1259   piperacillin-tazobactam (ZOSYN) IVPB 3.375 g  3.375 g Intravenous Q8H CWynelle Cleveland RPH 12.5 mL/hr at 08/31/22 0528 3.375 g at 08/31/22 [id number]  predniSONE (DELTASONE) tablet 50 mg  50 mg Oral Q breakfast Breeze, SBenancio Deeds NP       sodium chloride 0.9 % nebulizer solution 3 mL  3 mL Nebulization Q8H PRN Sreenath, Sudheer B, MD       tacrolimus (PROGRAF) capsule 2 mg  2 mg Oral BID BColon Flattery NP         Abtx:  Anti-infectives (From admission,  onward)    Start     Dose/Rate Route Frequency Ordered Stop   08/30/22 1945  piperacillin-tazobactam (ZOSYN) IVPB 3.375 g        3.375 g 12.5 mL/hr over 240 Minutes Intravenous Every 8 hours 08/30/22 1858         REVIEW OF SYSTEMS:  Pt is somnolent Mom at bed side Objective:  VITALS:  BP 96/69 (BP Location: Left Arm)   Pulse 98   Temp 98.2 F (36.8 C)   Resp 18   Ht '4\' 11"'$  (  1.499 m)   Wt 58.3 kg   SpO2 96%   BMI 25.97 kg/m   PHYSICAL EXAM:  General:somnolent Head: Normocephalic, without obvious abnormality, atraumatic. Eyes: puffy eyelids  false eyelashes ENT cannot examine Neck: Supple, symmetrical, no adenopathy, thyroid: non tender no carotid bruit and no JVD. Lungs: b/l air entry Crepts b/l Heart: Tachycardia Abdomen: not examined Extremities:edema ankles Skin: No rashes or lesions. Or bruising Lymph: Cervical, supraclavicular normal. Neurologic: moves all limbs Pertinent Labs Lab Results CBC    Component Value Date/Time   WBC 24.2 (H) 08/31/2022 0449   RBC 3.15 (L) 08/31/2022 0449   HGB 8.9 (L) 08/31/2022 0449   HCT 27.8 (L) 08/31/2022 0449   PLT 451 (H) 08/31/2022 0449   MCV 88.3 08/31/2022 0449   MCH 28.3 08/31/2022 0449   MCHC 32.0 08/31/2022 0449   RDW 14.7 08/31/2022 0449   LYMPHSABS 4.7 (H) 08/22/2022 0708   MONOABS 1.0 08/22/2022 0708   EOSABS 0.2 08/22/2022 0708   BASOSABS 0.1 08/22/2022 0708       Latest Ref Rng & Units 08/31/2022    4:49 AM 08/30/2022    5:27 AM 08/25/2022    4:04 AM  CMP  Glucose 70 - 99 mg/dL 238  53  324   BUN 6 - 20 mg/dL 19  29  42   Creatinine 0.44 - 1.00 mg/dL 0.99  1.09  1.49   Sodium 135 - 145 mmol/L 134  136  131   Potassium 3.5 - 5.1 mmol/L 4.2  3.6  4.5   Chloride 98 - 111 mmol/L 108  107  103   CO2 22 - 32 mmol/L '23  19  22   '$ Calcium 8.9 - 10.3 mg/dL 7.2  7.0  7.2   Total Protein 6.5 - 8.1 g/dL 4.3     Total Bilirubin 0.3 - 1.2 mg/dL 0.3     Alkaline Phos 38 - 126 U/L 97     AST 15 - 41 U/L 11      ALT 0 - 44 U/L 10         Microbiology: Recent Results (from the past 240 hour(s))  Resp panel by RT-PCR (RSV, Flu A&B, Covid) Anterior Nasal Swab     Status: None   Collection Time: 08/22/22  7:08 AM   Specimen: Anterior Nasal Swab  Result Value Ref Range Status   SARS Coronavirus 2 by RT PCR NEGATIVE NEGATIVE Final    Comment: (NOTE) SARS-CoV-2 target nucleic acids are NOT DETECTED.  The SARS-CoV-2 RNA is generally detectable in upper respiratory specimens during the acute phase of infection. The lowest concentration of SARS-CoV-2 viral copies this assay can detect is 138 copies/mL. A negative result does not preclude SARS-Cov-2 infection and should not be used as the sole basis for treatment or other patient management decisions. A negative result may occur with  improper specimen collection/handling, submission of specimen other than nasopharyngeal swab, presence of viral mutation(s) within the areas targeted by this assay, and inadequate number of viral copies(<138 copies/mL). A negative result must be combined with clinical observations, patient history, and epidemiological information. The expected result is Negative.  Fact Sheet for Patients:  EntrepreneurPulse.com.au  Fact Sheet for Healthcare Providers:  IncredibleEmployment.be  This test is no t yet approved or cleared by the [place] FDA and  has been authorized for detection and/or diagnosis of SARS-CoV-2 by FDA under an Emergency Use Authorization (EUA). This EUA will remain  in effect (meaning this test can be used)  for the duration of the COVID-19 declaration under Section 564(b)(1) of the Act, 21 U.S.C.section 360bbb-3(b)(1), unless the authorization is terminated  or revoked sooner.       Influenza A by PCR NEGATIVE NEGATIVE Final   Influenza B by PCR NEGATIVE NEGATIVE Final    Comment: (NOTE) The Xpert Xpress SARS-CoV-2/FLU/RSV plus assay is intended as an  aid in the diagnosis of influenza from Nasopharyngeal swab specimens and should not be used as a sole basis for treatment. Nasal washings and aspirates are unacceptable for Xpert Xpress SARS-CoV-2/FLU/RSV testing.  Fact Sheet for Patients: EntrepreneurPulse.com.au  Fact Sheet for Healthcare Providers: IncredibleEmployment.be  This test is not yet approved or cleared by the [place] FDA and has been authorized for detection and/or diagnosis of SARS-CoV-2 by FDA under an Emergency Use Authorization (EUA). This EUA will remain in effect (meaning this test can be used) for the duration of the COVID-19 declaration under Section 564(b)(1) of the Act, 21 U.S.C. section 360bbb-3(b)(1), unless the authorization is terminated or revoked.     Resp Syncytial Virus by PCR NEGATIVE NEGATIVE Final    Comment: (NOTE) Fact Sheet for Patients: EntrepreneurPulse.com.au  Fact Sheet for Healthcare Providers: IncredibleEmployment.be  This test is not yet approved or cleared by the [place] FDA and has been authorized for detection and/or diagnosis of SARS-CoV-2 by FDA under an Emergency Use Authorization (EUA). This EUA will remain in effect (meaning this test can be used) for the duration of the COVID-19 declaration under Section 564(b)(1) of the Act, 21 U.S.C. section 360bbb-3(b)(1), unless the authorization is terminated or revoked.  Performed at [facility], [address] [place], [place] [address]   Blood culture (routine x 2)     Status: None   Collection Time: 08/22/22  9:18 AM   Specimen: BLOOD  Result Value Ref Range Status   Specimen Description BLOOD BLOOD LEFT ARM  Final   Special Requests   Final    BOTTLES DRAWN AEROBIC AND ANAEROBIC Blood Culture results may not be optimal due to an inadequate volume of blood received in culture bottles   Culture   Final    NO GROWTH 5 DAYS Performed  at [facility], [address] [place], [place] [address]    Report Status 08/27/2022 FINAL  Final  Blood culture (routine x 2)     Status: None   Collection Time: 08/22/22 12:02 PM   Specimen: BLOOD RIGHT HAND  Result Value Ref Range Status   Specimen Description BLOOD RIGHT HAND  Final   Special Requests   Final    BOTTLES DRAWN AEROBIC ONLY Blood Culture results may not be optimal due to an inadequate volume of blood received in culture bottles   Culture   Final    NO GROWTH 5 DAYS Performed at [facility], [address] [place], [place] [address]    Report Status 08/27/2022 FINAL  Final  MRSA Next Gen by PCR, Nasal     Status: None   Collection Time: 08/22/22 12:47 PM   Specimen: Nasal Mucosa; Nasal Swab  Result Value Ref Range Status   MRSA by PCR Next Gen NOT DETECTED NOT DETECTED Final    Comment: (NOTE) The GeneXpert MRSA Assay (FDA approved for NASAL specimens only), is one component of a comprehensive MRSA colonization surveillance program. It is not intended to diagnose MRSA infection nor to guide or monitor treatment for MRSA infections. Test performance is not FDA approved in patients less than 2 years  old. Performed at [facility], [address] [place], [place] [address]   Aspergillus Ag, BAL/Serum     Status: None   Collection Time: 08/23/22  6:21 AM   Specimen: Vein; Blood  Result Value Ref Range Status   Aspergillus Ag, BAL/Serum 0.07 0.00 - 0.49 Index Final    Comment: (NOTE) Performed At: [facility] [address] [place], [place] [id number] Rush Farmer MD [id number]   Culture, blood (Routine X 2) w Reflex to ID Panel     Status: None (Preliminary result)   Collection Time: 08/30/22  5:13 PM   Specimen: BLOOD  Result Value Ref Range Status   Specimen Description BLOOD BLOOD RIGHT ARM  Final   Special Requests   Final    BOTTLES DRAWN AEROBIC AND ANAEROBIC Blood Culture adequate volume   Culture    Final    NO GROWTH < 24 HOURS Performed at [facility], [address] [place], [place] [address]    Report Status PENDING  Incomplete  MRSA Next Gen by PCR, Nasal     Status: None   Collection Time: 08/30/22  6:36 PM   Specimen: Nasal Mucosa; Nasal Swab  Result Value Ref Range Status   MRSA by PCR Next Gen NOT DETECTED NOT DETECTED Final    Comment: (NOTE) The GeneXpert MRSA Assay (FDA approved for NASAL specimens only), is one component of a comprehensive MRSA colonization surveillance program. It is not intended to diagnose MRSA infection nor to guide or monitor treatment for MRSA infections. Test performance is not FDA approved in patients less than 55 years old. Performed at [facility], [address] [place], [place] [address]   Culture, blood (Routine X 2) w Reflex to ID Panel     Status: None (Preliminary result)   Collection Time: 08/30/22 10:15 PM   Specimen: BLOOD  Result Value Ref Range Status   Specimen Description BLOOD BLOOD RIGHT HAND  Final   Special Requests   Final    BOTTLES DRAWN AEROBIC AND ANAEROBIC Blood Culture adequate volume   Culture   Final    NO GROWTH < 12 HOURS Performed at [facility], [address] [place],  [address]    Report Status PENDING  Incomplete   08/29/22 IMAGING RESULTS: Cavitary lesions Infiltrate on the rt side      08/25/22 ?   Impression/Recommendation ?22 y.o. 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 Was discharged on 08/25/22 after staying in the hospital for 3 days and was diagnosed with heave PE burden and had undergone mechanical thrombectomy   Nodulocavitary lesions of upper lobes rt > left in a patient with occlusive PE of rt lobar brances Also has infiltrate on the rt side which is pulmonary infarct  Her presentation is not TB-  DC airborne  Doubt  any bacterial infection Procal < 0.10 Currently on Iv zosyn May DC soon  Leucocytosis due to PE/pulmonary infarct ? Because of her immune compromised state concern for opportunistic infections like Nocardia, PJP Beta D glucan borderline elevated and not significant- can repeat MRSA nares neg  ? Nephrotic syndrome with proteinuria puts her at risk for thrombosis- recommend checking antithrombin III and other factors like Protein C/S etc   Minimal change disease causing nephrotic syndrome- she has been non compliant with tacrolimus- now on high dose steroids with PJP  prophylaxis?   DM-  On insulin ? ?Discussed the management with her mother in detail and care team

## 2022-09-01 DIAGNOSIS — J9601 Acute respiratory failure with hypoxia: Secondary | ICD-10-CM

## 2022-09-01 HISTORY — DX: Acute respiratory failure with hypoxia: J96.01

## 2022-09-01 LAB — RESPIRATORY PANEL BY PCR
Adenovirus: NOT DETECTED
Adenovirus: NOT DETECTED
Bordetella Parapertussis: NOT DETECTED
Bordetella Parapertussis: NOT DETECTED
Bordetella pertussis: NOT DETECTED
Bordetella pertussis: NOT DETECTED
Chlamydophila pneumoniae: NOT DETECTED
Chlamydophila pneumoniae: NOT DETECTED
Coronavirus 229E: NOT DETECTED
Coronavirus 229E: NOT DETECTED
Coronavirus HKU1: NOT DETECTED
Coronavirus HKU1: NOT DETECTED
Coronavirus NL63: NOT DETECTED
Coronavirus NL63: NOT DETECTED
Coronavirus OC43: NOT DETECTED
Coronavirus OC43: NOT DETECTED
Influenza A: NOT DETECTED
Influenza A: NOT DETECTED
Influenza B: NOT DETECTED
Influenza B: NOT DETECTED
Metapneumovirus: NOT DETECTED
Metapneumovirus: NOT DETECTED
Mycoplasma pneumoniae: NOT DETECTED
Mycoplasma pneumoniae: NOT DETECTED
Parainfluenza Virus 1: NOT DETECTED
Parainfluenza Virus 1: NOT DETECTED
Parainfluenza Virus 2: NOT DETECTED
Parainfluenza Virus 2: NOT DETECTED
Parainfluenza Virus 3: NOT DETECTED
Parainfluenza Virus 3: NOT DETECTED
Parainfluenza Virus 4: NOT DETECTED
Parainfluenza Virus 4: NOT DETECTED
Respiratory Syncytial Virus: NOT DETECTED
Respiratory Syncytial Virus: NOT DETECTED
Rhinovirus / Enterovirus: NOT DETECTED
Rhinovirus / Enterovirus: NOT DETECTED

## 2022-09-01 LAB — BASIC METABOLIC PANEL
Anion gap: 9 (ref 5–15)
BUN: 22 mg/dL — ABNORMAL HIGH (ref 6–20)
CO2: 18 mmol/L — ABNORMAL LOW (ref 22–32)
Calcium: 7.7 mg/dL — ABNORMAL LOW (ref 8.9–10.3)
Chloride: 102 mmol/L (ref 98–111)
Creatinine, Ser: 1.25 mg/dL — ABNORMAL HIGH (ref 0.44–1.00)
GFR, Estimated: >60 mL/min (ref >60)
Glucose, Bld: 448 mg/dL — ABNORMAL HIGH (ref 70–99)
Potassium: 5.9 mmol/L — ABNORMAL HIGH (ref 3.5–5.1)
Sodium: 129 mmol/L — ABNORMAL LOW (ref 135–145)

## 2022-09-01 LAB — GLUCOSE, CAPILLARY
Glucose-Capillary: 421 mg/dL — ABNORMAL HIGH (ref 70–99)
Glucose-Capillary: 443 mg/dL — ABNORMAL HIGH (ref 70–99)
Glucose-Capillary: 342 mg/dL — ABNORMAL HIGH (ref 70–99)
Glucose-Capillary: 354 mg/dL — ABNORMAL HIGH (ref 70–99)
Glucose-Capillary: 245 mg/dL — ABNORMAL HIGH (ref 70–99)
Glucose-Capillary: 315 mg/dL — ABNORMAL HIGH (ref 70–99)

## 2022-09-01 LAB — CBC
HCT: 30.3 % — ABNORMAL LOW (ref 36.0–46.0)
Hemoglobin: 9.9 g/dL — ABNORMAL LOW (ref 12.0–15.0)
MCH: 28.4 pg (ref 26.0–34.0)
MCHC: 32.7 g/dL (ref 30.0–36.0)
MCV: 86.8 fL (ref 80.0–100.0)
Platelets: 430 10*3/uL — ABNORMAL HIGH (ref 150–400)
RBC: 3.49 MIL/uL — ABNORMAL LOW (ref 3.87–5.11)
RDW: 14.3 % (ref 11.5–15.5)
WBC: 23.8 10*3/uL — ABNORMAL HIGH (ref 4.0–10.5)
nRBC: 0 % (ref 0.0–0.2)

## 2022-09-01 LAB — APTT
aPTT: 111 seconds — ABNORMAL HIGH (ref 24–36)
aPTT: 104 seconds — ABNORMAL HIGH (ref 24–36)
aPTT: 77 seconds — ABNORMAL HIGH (ref 24–36)

## 2022-09-01 LAB — HEPARIN LEVEL (UNFRACTIONATED): Heparin Unfractionated: 0.34 IU/mL (ref 0.30–0.70)

## 2022-09-01 LAB — PROCALCITONIN: Procalcitonin: 0.18 ng/mL

## 2022-09-01 MED ORDER — INSULIN GLARGINE-YFGN 100 UNIT/ML ~~LOC~~ SOLN
20.0000 [IU] | Freq: Every day | SUBCUTANEOUS | Status: DC
  Administered 2022-09-01: 20 [IU] via SUBCUTANEOUS
  Filled 2022-09-01: qty 0.2

## 2022-09-01 MED ORDER — IPRATROPIUM-ALBUTEROL 0.5-2.5 (3) MG/3ML IN SOLN
3.0000 mL | Freq: Two times a day (BID) | RESPIRATORY_TRACT | Status: DC
  Administered 2022-09-01 – 2022-09-05 (×8): 3 mL via RESPIRATORY_TRACT
  Filled 2022-09-01 (×8): qty 3

## 2022-09-01 MED ORDER — TORSEMIDE 20 MG PO TABS
20.0000 mg | ORAL_TABLET | Freq: Every day | ORAL | Status: DC
  Administered 2022-09-01 – 2022-09-05 (×5): 20 mg via ORAL
  Filled 2022-09-01 (×5): qty 1

## 2022-09-01 MED ORDER — INSULIN ASPART 100 UNIT/ML IJ SOLN
6.0000 [IU] | Freq: Once | INTRAMUSCULAR | Status: AC
  Administered 2022-09-01: 6 [IU] via SUBCUTANEOUS
  Filled 2022-09-01: qty 1

## 2022-09-01 MED ORDER — SULFAMETHOXAZOLE-TRIMETHOPRIM 800-160 MG PO TABS
1.0000 | ORAL_TABLET | ORAL | Status: DC
  Administered 2022-09-01 – 2022-09-04 (×2): 1 via ORAL
  Filled 2022-09-01 (×2): qty 1

## 2022-09-01 MED ORDER — INSULIN GLARGINE-YFGN 100 UNIT/ML ~~LOC~~ SOLN
15.0000 [IU] | Freq: Two times a day (BID) | SUBCUTANEOUS | Status: DC
  Administered 2022-09-01 – 2022-09-05 (×8): 15 [IU] via SUBCUTANEOUS
  Filled 2022-09-01 (×9): qty 0.15

## 2022-09-01 NOTE — Progress Notes (Signed)
Unable to obtain sputum

## 2022-09-01 NOTE — Progress Notes (Signed)
ANTICOAGULATION CONSULT NOTE  Pharmacy Consult for Heparin  Indication: pulmonary embolus  No Known Allergies  Patient Measurements: Height: '4\' 11"'$  (149.9 cm) Weight: 58.3 kg (128 lb 9.6 oz) IBW/kg (Calculated) : 43.2 Heparin DW: 55.3 kg  Vital Signs: Temp: 97.8 F (36.6 C) (03/01 1620) BP: 111/71 (03/01 1630) Pulse Rate: 95 (03/01 1630)  Labs: Recent Labs    08/30/22 0527 08/30/22 0648 08/30/22 1512 08/31/22 0449 08/31/22 1110 08/31/22 2012 09/01/22 0344 09/01/22 1056 09/01/22 1928  HGB 10.4*  --   --  8.9*  --   --  9.9*  --   --   HCT 32.8*  --   --  27.8*  --   --  30.3*  --   --   PLT 518*  --   --  451*  --   --  430*  --   --   APTT 33  --    < > 77* 64*   < > 111* 104* 77*  LABPROT 17.1*  --   --   --   --   --   --   --   --   INR 1.4*  --   --   --   --   --   --   --   --   HEPARINUNFRC >1.10*  --   --  0.22* 0.17*  --  0.34  --   --   CREATININE 1.09*  --   --  0.99  --   --  1.25*  --   --   TROPONINIHS 17 22*  --   --   --   --   --   --   --    < > = values in this interval not displayed.     Estimated Creatinine Clearance: 54.8 mL/min (A) (by C-G formula based on SCr of 1.25 mg/dL (H)).   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 female with medical history significant of multiple medical issues including type 1 diabetes poorly controlled, hypoalbuminemia, stage III CKD, nephrotic syndrome, tobacco abuse, marijuana abuse presenting with recurrent pulmonary embolus with infarcts. Pt was on apixaban PTA.   2/29 1110 HL 0.17  2/29 2012 aPTT 61   3/01 @ 0344:  aPTT = 111,  HL = 0.34 3/01 @ 1056:  aPTT =  104, supratherapeutic  3/01 '@1930'$  aPTT =  77,  therapeutic    Goal of Therapy:  Heparin level 0.3-0.7 units/ml aPTT 66-102 seconds Monitor platelets by anticoagulation protocol: Yes   Plan: aPTT therapeutic x 1 -Continue heparin drip rate at 1300 units/hr -Recheck aPTT in 6 hours from previous level -Continue to adjust based on aPTT until corelation with HL. -Check CBC and HL 3/2 with AM labs  Wynelle Cleveland, PharmD, BCPS Clinical Pharmacist 09/01/2022 8:03 PM

## 2022-09-01 NOTE — Progress Notes (Signed)
Date of Admission:  08/30/2022      ID: Catherine [name] is a 22 y.o. 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)   Marijuana abuse   Tobacco dependence   Cavitary lesion of lung   Leukocytosis    Subjective: Pt is doing better No chest pain Breathing better  Medications:   budesonide (PULMICORT) nebulizer solution  0.5 mg Nebulization BID   feeding supplement  237 mL Oral BID BM   feeding supplement (GLUCERNA SHAKE)  237 mL Oral BID BM   insulin aspart  0-5 Units Subcutaneous QHS   insulin aspart  0-9 Units Subcutaneous TID WC   insulin aspart  3 Units Subcutaneous TID WC   insulin aspart  6 Units Subcutaneous Once   ipratropium-albuterol  3 mL Nebulization BID   predniSONE  50 mg Oral Q breakfast   tacrolimus  2 mg Oral BID    Objective: Vital signs in last 24 hours: Temp:  [97.6 F (36.4 C)-99.2 F (37.3 C)] 98 F (36.7 C) (03/01 0755) Pulse Rate:  [86-105] 86 (03/01 0755) Resp:  [18-20] 20 (03/01 0755) BP: (86-108)/(48-70) 86/50 (03/01 0755) SpO2:  [82 %-100 %] 98 % (03/01 0758)    PHYSICAL EXAM:  General: awake cooperative, no distress,  Lungs: b/la ir entry Crepts bases rt > left Heart: s1s2. Abdomen: Soft, non-tender,not distended. Bowel sounds normal. No masses Extremities: edema legs Skin: No rashes or lesions. Or bruising Lymph: Cervical, supraclavicular normal. Neurologic: Grossly non-focal  Lab Results Recent Labs    08/31/22 0449 09/01/22 0344  WBC 24.2* 23.8*  HGB 8.9* 9.9*  HCT 27.8* 30.3*  NA 134* 129*  K 4.2 5.9*  CL 108 102  CO2 23 18*  BUN 19 22*  CREATININE 0.99 1.25*   Liver Panel Recent Labs    08/31/22 0449  PROT 4.3*  ALBUMIN <1.5*  AST 11*  ALT 10  ALKPHOS 97  BILITOT 0.3   SedimeMicrobiology: BC -  NG    Assessment/Plan: ?21 y.o. 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 Was discharged on 08/25/22 after staying in the hospital for 3 days and was diagnosed with heave PE burden and had undergone mechanical thrombectomy   Nodulocavitary lesions of upper lobes rt > left in a patient with occlusive PE of rt lobar brances Also has infiltrate on the rt side which is pulmonary infarct  Her presentation is not TB-  DC airborne  Doubt  bacterial infection as well Procal < 0.10, 0.18 Currently on Iv zosyn Last day 3/3   Leucocytosis due to PE/pulmonary infarct ? Because of her immune compromised state concern for opportunistic infections like Nocardia, PJP Beta D glucan borderline elevated and not significant-  repeated MRSA nares neg   ? Nephrotic syndrome with proteinuria puts her at risk for thrombosis- recommend checking antithrombin III and other factors like Protein C/S etc   Minimal change disease causing nephrotic syndrome- she has been non compliant with tacrolimus- now on high dose steroids with PJP prophylaxis?   DM-  On insulin ? ?Discussed the management with patient and  her mother in detail. ID will follow her peripherally this weekend- call [facility] if needed

## 2022-09-01 NOTE — Progress Notes (Signed)
ANTICOAGULATION CONSULT NOTE  Pharmacy Consult for Heparin  Indication: pulmonary embolus  No Known Allergies  Patient Measurements: Height: '4\' 11"'$  (149.9 cm) Weight: 58.3 kg (128 lb 9.6 oz) IBW/kg (Calculated) : 43.2 Heparin DW: 55.3 kg  Vital Signs: Temp: 97.6 F (36.4 C) (03/01 0408) Temp Source: Oral (03/01 0408) BP: 96/68 (03/01 0408) Pulse Rate: 88 (03/01 0408)  Labs: Recent Labs    08/30/22 0527 08/30/22 [id number] 08/30/22 1512 08/31/22 0449 08/31/22 1110 08/31/22 2012 09/01/22 0344  HGB 10.4*  --   --  8.9*  --   --  9.9*  HCT 32.8*  --   --  27.8*  --   --  30.3*  PLT 518*  --   --  451*  --   --  430*  APTT 33  --    < > 77* 64* 61* 111*  LABPROT 17.1*  --   --   --   --   --   --   INR 1.4*  --   --   --   --   --   --   HEPARINUNFRC >1.10*  --   --  0.22* 0.17*  --  0.34  CREATININE 1.09*  --   --  0.99  --   --  1.25*  TROPONINIHS 17 22*  --   --   --   --   --    < > = values in this interval not displayed.     Estimated Creatinine Clearance: 54.8 mL/min (A) (by C-G formula based on SCr of 1.25 mg/dL (H)).   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 female with medical history significant of multiple medical issues including type 1 diabetes poorly controlled, hypoalbuminemia, stage III CKD, nephrotic syndrome, tobacco abuse, marijuana abuse presenting with recurrent pulmonary embolus with infarcts. Pt was on apixaban PTA.   2/29 1110 HL 0.17  2/29 2012 aPTT 61   3/01 @ 0344:  aPTT = 111,  HL = 0.34     Goal of Therapy:  Heparin level 0.3-0.7 units/ml aPTT 66-102 seconds Monitor platelets by anticoagulation protocol: Yes   Plan:  3/1 @ 0344:   aPTT = 111, HL = 0.34 - aPTT elevated but HL is therapeutic  - Will decrease heparin drip rate to 1350 units/hr and recheck aPTT 6 hrs after rate change - Will continue to use aPTT to guide dosing until HL and aPTT are both therapeutic - Will recheck HL on 3/2 with AM labs.   Orene Desanctis, PharmD Clinical Pharmacist 09/01/2022 4:38 AM

## 2022-09-01 NOTE — Progress Notes (Signed)
Progress Note   Patient: Catherine [name] [id number] DOB: Nov 01, 2000 DOA: 08/30/2022     2 DOS: the patient was seen and examined on 09/01/2022    Brief Narrative:   22 y.o. female with medical history significant of multiple medical issues including type 1 diabetes poorly controlled, hypoalbuminemia, stage III CKD, nephrotic syndrome, tobacco abuse, marijuana abuse presenting with recurrent pulmonary embolus with infarcts.  Patient noted to have been admitted February 20 through 23 for similar issues with noted pulmonary embolus with right heart strain that required thrombectomy by Dr. Lucky Cowboy on February 20 as well as other issues including cavitary lung masses, nephrotic syndrome and minimal-change disease.  Please see discharge summary for further details.  Patient was noted to have been evaluated at [facility] back in January and discharged on Eliquis with concern for significant hypoalbuminemia.  Patient was continued on IV anticoagulation and transition to Eliquis at discharge pending follow-up during most recent hospitalization.  Patient reports worsening shortness of breath as well as chest pain over the past 2 to 3 days.  Worsening pleuritic chest pain. EKG was sinus tachycardia.  CTA of the chest with right middle lobe infarct, bulky bilateral pulmonary embolism noted from prior imaging.  Positive nonocclusive thrombus in the left lower lobe similar to prior.  RV to LV ratio of 2.3, cavitary lesions in the lateral and posterior right upper lobe similar to prior.  2/29: Seen in consultation by vascular surgery.  No indication at this point for repeat thrombectomy.  Seen in consultation by pulmonary to consider bronchoscopy however patient continued to high risk at this point and no indication for bronc.  Wanting sputum for AFB assessment however as of this time patient is unable to produce sputum.  Has been clinically improving well on intravenous Zosyn.     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 Recently evaluated by nephrology during recent admission Started on prednisone 50 mg as well as prophylactic Bactrim and tacrolimus Plan: Will continue home medications for now Does have some minimal to mild generalized edema on exam Currently torsemide on hold as patient's blood pressure has been borderline low Nephrology on board and case discussed   HTN (hypertension) BP stable Titrate home regimen   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 ([place]) with hyperglycemia Poorly controlled w/ most recent A1C 9.1 08/25/22 Discussed importance of blood sugar controlled  SSI  Lantus added to patient's medication We will continue to monitor blood glucose closely in  the setting of current steroid use   Leukocytosis WBC 24 on presentation  Suspect multifactorial in setting of steroid  use, pulmonary infarcts as well as cavitary lesions Fairly stable in review of trend from prior admission Will monitor for now Continue treatment regimen Reassess if there is a significant uptrend or if there is a clinical decline in the patient Follow closely   Cavitary lesion of lung Cavitary lesions of the lung relatively unchanged from prior admission Plan: Patient has been improving on intravenous Zosyn and nebulizer therapy.  Will continue we will like to obtain sputum for AFB as soon as patient is able to produce.  Infectious disease and pulmonologist on board we appreciate input   tobacco dependence Patient ports no longer smoking Follow   Marijuana abuse Patient reports no longer smoking Will monitor for now     DVT prophylaxis: Heparin GTT  Code Status: Full code  Family Communication: Mother and father at bedside 2/29, 3/1  Disposition Plan: Status is: Inpatient  Remains inpatient appropriate because: Cavitary lesions, unclear etiology.  Possibly infectious in nature.  Multiple consultants involved including pulmonology, infectious disease, nephrology     Level of care: Telemetry Medical   Consultants:  Pulmonary ID Vascular surgery       Subjective: Patient seen and examined at bedside this morning   Physical Exam: Vitals:   09/01/22 1106 09/01/22 1620 09/01/22 1630 09/01/22 1641  BP: 97/61 93/67 111/71   Pulse: 96 98 95   Resp: 18 (!) 32  (!) 25  Temp: 98.1 F (36.7 C) 97.8 F (36.6 C)    TempSrc:      SpO2: 93% 93%    Weight:      Height:       General exam: Appears fatigued Respiratory system: Scattered wheezes, rhonchi, rales.  Normal work of breathing.  3 L Cardiovascular system: S1-S2, RRR, no murmurs, trace pedal edema BLE Gastrointestinal system: Soft, NT/ND, normal bowel sounds Central nervous system: Alert and oriented. No focal neurological deficits. Extremities: Symmetric 5 x 5 power. Skin: No rashes, lesions or  ulcers Psychiatry: Judgement and insight appear normal. Mood & affect appropriate.   Data Reviewed:  Blood sugar recordings reviewed by me as well as other labs  Family Communication: Discussed with patient's mother present at bedside  Disposition: Status is: Inpatient .  Planned Discharge Destination: Pending clinical course     Time spent: 50 minutes  Author: Verline Lema, MD 09/01/2022 5:45 PM  For on call review [email or web address].

## 2022-09-01 NOTE — Progress Notes (Signed)
ANTICOAGULATION CONSULT NOTE  Pharmacy Consult for Heparin  Indication: pulmonary embolus  No Known Allergies  Patient Measurements: Height: '4\' 11"'$  (149.9 cm) Weight: 58.3 kg (128 lb 9.6 oz) IBW/kg (Calculated) : 43.2 Heparin DW: 55.3 kg  Vital Signs: Temp: 98.1 F (36.7 C) (03/01 1106) Temp Source: Oral (03/01 0408) BP: 97/61 (03/01 1106) Pulse Rate: 96 (03/01 1106)  Labs: Recent Labs    08/30/22 0527 08/30/22 0648 08/30/22 1512 08/31/22 0449 08/31/22 1110 08/31/22 2012 09/01/22 0344 09/01/22 1056  HGB 10.4*  --   --  8.9*  --   --  9.9*  --   HCT 32.8*  --   --  27.8*  --   --  30.3*  --   PLT 518*  --   --  451*  --   --  430*  --   APTT 33  --    < > 77* 64* 61* 111* 104*  LABPROT 17.1*  --   --   --   --   --   --   --   INR 1.4*  --   --   --   --   --   --   --   HEPARINUNFRC >1.10*  --   --  0.22* 0.17*  --  0.34  --   CREATININE 1.09*  --   --  0.99  --   --  1.25*  --   TROPONINIHS 17 22*  --   --   --   --   --   --    < > = values in this interval not displayed.     Estimated Creatinine Clearance: 54.8 mL/min (A) (by C-G formula based on SCr of 1.25 mg/dL (H)).   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 female with medical history significant of multiple medical issues including type 1 diabetes poorly controlled, hypoalbuminemia, stage III CKD, nephrotic syndrome, tobacco abuse, marijuana abuse presenting with recurrent pulmonary embolus with infarcts. Pt was on apixaban PTA.   2/29 1110 HL 0.17  2/29 2012 aPTT 61   3/01 @ 0344:  aPTT = 111,  HL = 0.34 3/01 @ 1056:  aPTT =  104, supratherapeutic     Goal of Therapy:  Heparin level 0.3-0.7  units/ml aPTT 66-102 seconds Monitor platelets by anticoagulation protocol: Yes   Plan: aPTT supratherapeutic @ 1350 units/hr -Reduce heparin drip rate to 1300 units/hr -Recheck aPTT in 6 hours from rate change -Continue to adjust based on aPTT until corelation with HL. -Check CBC and HL 3/2 with AM labs  Lorin Picket, PharmD Clinical Pharmacist 09/01/2022 12:09 PM

## 2022-09-01 NOTE — Inpatient Diabetes Management (Addendum)
Inpatient Diabetes Program Recommendations  AACE/ADA: New Consensus Statement on Inpatient Glycemic Control (2015)  Target Ranges:  Prepandial:   less than 140 mg/dL      Peak postprandial:   less than 180 mg/dL (1-2 hours)      Critically ill patients:  140 - 180 mg/dL   Lab Results  Component Value Date   GLUCAP 421 (H) 09/01/2022   HGBA1C 9.1 (H) 08/25/2022    Review of Glycemic Control  Latest Reference Range & Units 08/31/22 08:19 08/31/22 11:57 08/31/22 16:42 08/31/22 21:26 09/01/22 07:52 09/01/22 07:55  Glucose-Capillary 70 - 99 mg/dL 226 (H) 221 (H) 235 (H) 260 (H) 443 (H) 421 (H)  (H): Data is abnormally high Diabetes history: DM1(does not make insulin.  Needs correction, basal and meal coverage)   Outpatient Diabetes medications:  Lantus 20 units QHS, Novolog 1 units for every 8 carbs, Novolog 1 units for every 50 mg/dL >150 mg/dL [facility] endocr[facility]ology   Current orders for Inpatient glycemic control: Novolog 0-9 units TID and 0-5 units QHS, Novolog 3 units TID with meals, Prednisone 50 mg QAM, Received Novolog 6 units X 1 this more for hyperglycemia   Inpatient Diabetes Program Recommendations:     Semglee 16 units QD (70% of home dose)  Will continue to follow while inpatient.  Thank you, Reche Dixon, MSN, [place] [facility] Coordinator Inpatient Diabetes Program [phone] (team pager from 8a-5p)

## 2022-09-01 NOTE — Progress Notes (Addendum)
[facility]  ROUNDING NOTE   Subjective:   Catherine [name] was admitted to [facility] on 08/30/2022 for Pulmonary embolism ([place]) [I26.99] Hypoglycemia [E16.2] History of nephrotic syndrome [Z87.441] Type 1 diabetes mellitus with other specified complication ([place]) 123XX123 Chest pain, unspecified type [R07.9] Pulmonary embolism, unspecified chronicity, unspecified pulmonary embolism type, unspecified whether acute cor pulmonale present [facility]) [I26.99]  Patient is known to our practice and was seen during previous admissions.   Patient seen resting in bed Mother at bedside Denies pain or discomfort Weaned to room air  Objective:  Vital signs in last 24 hours:  Temp:  [97.6 F (36.4 C)-99.2 F (37.3 C)] 98.1 F (36.7 C) (03/01 1106) Pulse Rate:  [86-105] 96 (03/01 1106) Resp:  [18-20] 18 (03/01 1106) BP: (86-108)/(48-70) 97/61 (03/01 1106) SpO2:  [82 %-100 %] 93 % (03/01 1106)  Weight change:  Filed Weights   08/30/22 0439 08/30/22 0559  Weight: 64.5 kg 58.3 kg    Intake/Output: I/O last 3 completed shifts: In: 1315.9 [P.O.:480; I.V.:585.9; IV Piggyback:250] Out: -    Intake/Output this shift:  No intake/output data recorded.  Physical Exam: General: NAD  Head: Normocephalic, atraumatic. Moist oral mucosal membranes, facial edema  Eyes: Anicteric  Lungs:  Clear to auscultation, normal effort  Heart: Regular rate and rhythm  Abdomen:  Soft, nontender  Extremities:  no peripheral edema.  Neurologic: Nonfocal, moving all four extremities  Skin: No lesions        Basic Metabolic Panel: Recent Labs  Lab 08/30/22 0527 08/31/22 0449 09/01/22 0344  NA 136 134* 129*  K 3.6 4.2 5.9*  CL 107 108 102  CO2 19* 23 18*  GLUCOSE 53* 238* 448*  BUN 29* 19 22*  CREATININE 1.09* 0.99 1.25*  CALCIUM 7.0* 7.2* 7.7*  MG  --  1.6*  --   PHOS  --  3.2  --      Liver Function Tests: Recent Labs  Lab 08/31/22 0449  AST 11*  ALT 10  ALKPHOS 97   BILITOT 0.3  PROT 4.3*  ALBUMIN <1.5*    No results for input(s): "LIPASE", "AMYLASE" in the last 168 hours. No results for input(s): "AMMONIA" in the last 168 hours.  CBC: Recent Labs  Lab 08/30/22 0527 08/31/22 0449 09/01/22 0344  WBC 24.2* 24.2* 23.8*  HGB 10.4* 8.9* 9.9*  HCT 32.8* 27.8* 30.3*  MCV 88.6 88.3 86.8  PLT 518* 451* 430*     Cardiac Enzymes: No results for input(s): "CKTOTAL", "CKMB", "CKMBINDEX", "TROPONINI" in the last 168 hours.  BNP: Invalid input(s): "POCBNP"  CBG: Recent Labs  Lab 08/31/22 2126 09/01/22 0752 09/01/22 0755 09/01/22 1118 09/01/22 1119  GLUCAP 260* 443* 421* 354* 342*     Microbiology: Results for orders placed or performed during the hospital encounter of 08/30/22  Culture, blood (Routine X 2) w Reflex to ID Panel     Status: None (Preliminary result)   Collection Time: 08/30/22  5:13 PM   Specimen: BLOOD  Result Value Ref Range Status   Specimen Description BLOOD BLOOD RIGHT ARM  Final   Special Requests   Final    BOTTLES DRAWN AEROBIC AND ANAEROBIC Blood Culture adequate volume   Culture   Final    NO GROWTH 2 DAYS Performed at [facility], [address], [place], [place] [address]    Report Status PENDING  Incomplete  MRSA Next Gen by PCR, Nasal     Status: None   Collection Time: 08/30/22  6:36 PM   Specimen: Nasal Mucosa; Nasal Swab  Result Value Ref Range Status   MRSA by PCR Next Gen NOT DETECTED NOT DETECTED Final    Comment: (NOTE) The GeneXpert MRSA Assay (FDA approved for NASAL specimens only), is one component of a comprehensive MRSA colonization surveillance program. It is not intended to diagnose MRSA infection nor to guide or monitor treatment for MRSA infections. Test performance is not FDA approved in patients less than 63 years old. Performed at [facility], [address] [place], [place] [address]   Culture, blood (Routine X 2) w Reflex to ID Panel     Status: None  (Preliminary result)   Collection Time: 08/30/22 10:15 PM   Specimen: BLOOD  Result Value Ref Range Status   Specimen Description BLOOD BLOOD RIGHT HAND  Final   Special Requests   Final    BOTTLES DRAWN AEROBIC AND ANAEROBIC Blood Culture adequate volume   Culture   Final    NO GROWTH 2 DAYS Performed at [facility], [address] [place], [place] [address]    Report Status PENDING  Incomplete  Respiratory (~20 pathogens) panel by PCR     Status: None   Collection Time: 08/31/22  5:42 PM   Specimen: Nasopharyngeal Swab; Respiratory  Result Value Ref Range Status   Adenovirus NOT DETECTED NOT DETECTED Final   Coronavirus 229E NOT DETECTED NOT DETECTED Final    Comment: (NOTE) The Coronavirus on the Respiratory Panel, DOES NOT test for the novel  Coronavirus (2019 nCoV)    Coronavirus HKU1 NOT DETECTED NOT DETECTED Final   Coronavirus NL63 NOT DETECTED NOT DETECTED Final   Coronavirus OC43 NOT DETECTED NOT DETECTED Final   Metapneumovirus NOT DETECTED NOT DETECTED Final   Rhinovirus / Enterovirus NOT DETECTED NOT DETECTED Final   Influenza A NOT DETECTED NOT DETECTED Final   Influenza B NOT DETECTED NOT DETECTED Final   Parainfluenza Virus 1 NOT DETECTED NOT DETECTED Final   Parainfluenza Virus 2 NOT DETECTED NOT DETECTED Final   Parainfluenza Virus 3 NOT DETECTED NOT DETECTED Final   Parainfluenza Virus 4 NOT DETECTED NOT DETECTED Final   Respiratory Syncytial Virus NOT DETECTED NOT DETECTED Final   Bordetella pertussis NOT DETECTED NOT DETECTED Final   Bordetella Parapertussis NOT DETECTED NOT DETECTED Final   Chlamydophila pneumoniae NOT DETECTED NOT DETECTED Final   Mycoplasma pneumoniae NOT DETECTED NOT DETECTED Final    Comment: Performed at [facility], [place]. [address] [place], [place] [address]  Respiratory (~20 pathogens) panel by PCR     Status: None   Collection Time: 08/31/22 11:20 PM   Specimen: Nasopharyngeal Swab; Respiratory  Result Value  Ref Range Status   Adenovirus NOT DETECTED NOT DETECTED Final   Coronavirus 229E NOT DETECTED NOT DETECTED Final    Comment: (NOTE) The Coronavirus on the Respiratory Panel, DOES NOT test for the novel  Coronavirus (2019 nCoV)    Coronavirus HKU1 NOT DETECTED NOT DETECTED Final   Coronavirus NL63 NOT DETECTED NOT DETECTED Final   Coronavirus OC43 NOT DETECTED NOT DETECTED Final   Metapneumovirus NOT DETECTED NOT DETECTED Final   Rhinovirus / Enterovirus NOT DETECTED NOT DETECTED Final   Influenza A NOT DETECTED NOT DETECTED Final   Influenza B NOT DETECTED NOT DETECTED Final   Parainfluenza Virus 1 NOT DETECTED NOT DETECTED Final   Parainfluenza Virus 2 NOT DETECTED NOT DETECTED Final   Parainfluenza Virus 3 NOT DETECTED NOT DETECTED Final   Parainfluenza  Virus 4 NOT DETECTED NOT DETECTED Final   Respiratory Syncytial Virus NOT DETECTED NOT DETECTED Final   Bordetella pertussis NOT DETECTED NOT DETECTED Final   Bordetella Parapertussis NOT DETECTED NOT DETECTED Final   Chlamydophila pneumoniae NOT DETECTED NOT DETECTED Final   Mycoplasma pneumoniae NOT DETECTED NOT DETECTED Final    Comment: Performed at [facility], [place] [address] [place], [place] [address]    Coagulation Studies: Recent Labs    08/30/22 0527  LABPROT 17.1*  INR 1.4*     Urinalysis: No results for input(s): "COLORURINE", "LABSPEC", "PHURINE", "GLUCOSEU", "HGBUR", "BILIRUBINUR", "KETONESUR", "PROTEINUR", "UROBILINOGEN", "NITRITE", "LEUKOCYTESUR" in the last 72 hours.  Invalid input(s): "APPERANCEUR"     Imaging: No results found.   Medications:    heparin 1,350 Units/hr (09/01/22 0437)   piperacillin-tazobactam (ZOSYN)  IV 3.375 g (09/01/22 0523)     budesonide (PULMICORT) nebulizer solution  0.5 mg Nebulization BID   feeding supplement  237 mL Oral BID BM   feeding supplement (GLUCERNA SHAKE)  237 mL Oral BID BM   insulin aspart  0-5 Units Subcutaneous QHS   insulin aspart  0-9 Units  Subcutaneous TID WC   insulin aspart  3 Units Subcutaneous TID WC   insulin glargine-yfgn  20 Units Subcutaneous Daily   ipratropium-albuterol  3 mL Nebulization BID   predniSONE  50 mg Oral Q breakfast   sulfamethoxazole-trimethoprim  1 tablet Oral Once per day on Mon Wed Fri   tacrolimus  2 mg Oral BID   torsemide  20 mg Oral Daily   benzonatate, chlorpheniramine-HYDROcodone, HYDROmorphone (DILAUDID) injection, sodium chloride  Assessment/ Plan:  Catherine [name] is a 22 y.o. 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 Most recent urine with 5.6 grams of proteinuria on 07/13/2022. With hypoalbuminemia Continue Prednisone and Tacrolimus Awaiting urine protein creatinine ratio Will restart TMP/SMX and Torsemide '20mg'$  daily  Acute pulmonary embolism: with hypoalbuminemia and nephrotic syndrome. Was discharged on apixaban.   Heparin drip. Considering IVC filter however patient too unstable at this time.  Hypotension: Chronic condition for this patient. Continue supportive care.   Secondary Hyperparathyroidism: not currently on an activated vitamin D.   5. Hyperkalemia, potassium 5.9, should correct with Torsemide.    LOS: 2   3/1/202411:33 AM

## 2022-09-01 NOTE — Progress Notes (Signed)
AFTER DUONEB AND PULMICORT TX PT HAD DRY NPC AND UNABLE TO PRODUCE A SPUTUM FOR AFB CULTURE.

## 2022-09-02 DIAGNOSIS — Z87441 Personal history of nephrotic syndrome: Secondary | ICD-10-CM

## 2022-09-02 DIAGNOSIS — E1069 Type 1 diabetes mellitus with other specified complication: Secondary | ICD-10-CM

## 2022-09-02 DIAGNOSIS — E162 Hypoglycemia, unspecified: Secondary | ICD-10-CM

## 2022-09-02 HISTORY — DX: Personal history of nephrotic syndrome: Z87.441

## 2022-09-02 LAB — CBC WITH DIFFERENTIAL/PLATELET
Abs Immature Granulocytes: 0.94 10*3/uL — ABNORMAL HIGH (ref 0.00–0.07)
Basophils Absolute: 0.1 10*3/uL (ref 0.0–0.1)
Basophils Relative: 0 %
Eosinophils Absolute: 0 10*3/uL (ref 0.0–0.5)
Eosinophils Relative: 0 %
HCT: 28.4 % — ABNORMAL LOW (ref 36.0–46.0)
Hemoglobin: 9.2 g/dL — ABNORMAL LOW (ref 12.0–15.0)
Immature Granulocytes: 4 %
Lymphocytes Relative: 14 %
Lymphs Abs: 3.6 10*3/uL (ref 0.7–4.0)
MCH: 27.9 pg (ref 26.0–34.0)
MCHC: 32.4 g/dL (ref 30.0–36.0)
MCV: 86.1 fL (ref 80.0–100.0)
Monocytes Absolute: 2.1 10*3/uL — ABNORMAL HIGH (ref 0.1–1.0)
Monocytes Relative: 8 %
Neutro Abs: 19.4 10*3/uL — ABNORMAL HIGH (ref 1.7–7.7)
Neutrophils Relative %: 74 %
Platelets: 349 10*3/uL (ref 150–400)
RBC: 3.3 MIL/uL — ABNORMAL LOW (ref 3.87–5.11)
RDW: 14 % (ref 11.5–15.5)
Smear Review: NORMAL
WBC: 26.1 10*3/uL — ABNORMAL HIGH (ref 4.0–10.5)
nRBC: 0.1 % (ref 0.0–0.2)

## 2022-09-02 LAB — BASIC METABOLIC PANEL
Anion gap: 6 (ref 5–15)
BUN: 30 mg/dL — ABNORMAL HIGH (ref 6–20)
CO2: 19 mmol/L — ABNORMAL LOW (ref 22–32)
Calcium: 7.8 mg/dL — ABNORMAL LOW (ref 8.9–10.3)
Chloride: 107 mmol/L (ref 98–111)
Creatinine, Ser: 1.42 mg/dL — ABNORMAL HIGH (ref 0.44–1.00)
GFR, Estimated: 54 mL/min — ABNORMAL LOW (ref >60)
Glucose, Bld: 273 mg/dL — ABNORMAL HIGH (ref 70–99)
Potassium: 4.4 mmol/L (ref 3.5–5.1)
Sodium: 132 mmol/L — ABNORMAL LOW (ref 135–145)

## 2022-09-02 LAB — APTT: aPTT: 82 seconds — ABNORMAL HIGH (ref 24–36)

## 2022-09-02 LAB — ANTITHROMBIN PANEL
AT III AG PPP IMM-ACNC: 37 % — ABNORMAL LOW (ref 72–124)
Antithrombin Activity: 53 % — ABNORMAL LOW (ref 75–135)

## 2022-09-02 LAB — PROTEIN S ACTIVITY: Protein S Activity: 45 % — ABNORMAL LOW (ref 63–140)

## 2022-09-02 LAB — GLUCOSE, CAPILLARY
Glucose-Capillary: 247 mg/dL — ABNORMAL HIGH (ref 70–99)
Glucose-Capillary: 231 mg/dL — ABNORMAL HIGH (ref 70–99)
Glucose-Capillary: 323 mg/dL — ABNORMAL HIGH (ref 70–99)
Glucose-Capillary: 280 mg/dL — ABNORMAL HIGH (ref 70–99)

## 2022-09-02 LAB — PROTEIN C ACTIVITY: Protein C Activity: 147 % (ref 73–180)

## 2022-09-02 LAB — HEPARIN LEVEL (UNFRACTIONATED): Heparin Unfractionated: 0.35 IU/mL (ref 0.30–0.70)

## 2022-09-02 MED ORDER — ACETAMINOPHEN 325 MG PO TABS
650.0000 mg | ORAL_TABLET | Freq: Four times a day (QID) | ORAL | Status: DC | PRN
  Administered 2022-09-02: 650 mg via ORAL
  Filled 2022-09-02: qty 2

## 2022-09-02 MED ORDER — MORPHINE BOLUS VIA INFUSION
1.0000 mg | Freq: Once | INTRAVENOUS | Status: DC
  Filled 2022-09-02: qty 1

## 2022-09-02 MED ORDER — MORPHINE SULFATE (PF) 2 MG/ML IV SOLN
1.0000 mg | Freq: Once | INTRAVENOUS | Status: AC
  Administered 2022-09-02: 1 mg via INTRAVENOUS
  Filled 2022-09-02: qty 1

## 2022-09-02 NOTE — Progress Notes (Signed)
   09/02/22 1030 09/02/22 1100 09/02/22 1130  Pain Assessment  Pain Scale 0-10 0-10 0-10  Pain Score 5  --  0  Pain Type Acute pain Acute pain Acute pain  Pain Location Back Back Back  Pain Orientation Lower Right Lower  Pain Descriptors / Indicators Discomfort Discomfort  --   Pain Frequency Intermittent Intermittent  --   Patients Stated Pain Goal 2  --  2  Pain Intervention(s) Medication (See eMAR)  --   --    Patient complained of low back pain, given IV morphine. Per patient pain decreased to 0/10 post intervention. Patent resting in bed no further concerns with pain will continue to assess.

## 2022-09-02 NOTE — Progress Notes (Signed)
[facility]  ROUNDING NOTE   Subjective:   Catherine [name] was admitted to [facility] on 08/30/2022 for Pulmonary embolism ([place]) [I26.99] Hypoglycemia [E16.2] History of nephrotic syndrome [Z87.441] Type 1 diabetes mellitus with other specified complication ([place]) 123XX123 Chest pain, unspecified type [R07.9] Pulmonary embolism, unspecified chronicity, unspecified pulmonary embolism type, unspecified whether acute cor pulmonale present [facility]) [I26.99]  Patient is known to our practice and was seen during previous admissions.   Update Patient seen resting quietly in bed, no family at bedside Currently denies any pain or discomfort No lower extremity edema Continues to feel as if her abdomen is slightly swollen  Objective:  Vital signs in last 24 hours:  Temp:  [97.6 F (36.4 C)-98.5 F (36.9 C)] 97.6 F (36.4 C) (03/02 0812) Pulse Rate:  [95-108] 97 (03/02 0812) Resp:  [16-32] 17 (03/02 0812) BP: (93-122)/(56-74) 122/74 (03/02 0812) SpO2:  [92 %-100 %] 94 % (03/02 0812)  Weight change:  Filed Weights   08/30/22 0439 08/30/22 0559  Weight: 64.5 kg 58.3 kg    Intake/Output: I/O last 3 completed shifts: In: 1555.7 [P.O.:480; I.V.:825.7; IV Piggyback:250] Out: -    Intake/Output this shift:  No intake/output data recorded.  Physical Exam: General: NAD  Head: Normocephalic, atraumatic. Moist oral mucosal membranes, facial edema  Eyes: Anicteric  Lungs:  Clear to auscultation, normal effort  Heart: Regular rate and rhythm  Abdomen:  Soft, nontender  Extremities:  no peripheral edema.  Neurologic: Nonfocal, moving all four extremities  Skin: No lesions        Basic Metabolic Panel: Recent Labs  Lab 08/30/22 0527 08/31/22 0449 09/01/22 0344 09/02/22 0623  NA 136 134* 129* 132*  K 3.6 4.2 5.9* 4.4  CL 107 108 102 107  CO2 19* 23 18* 19*  GLUCOSE 53* 238* 448* 273*  BUN 29* 19 22* 30*  CREATININE 1.09* 0.99 1.25* 1.42*  CALCIUM 7.0* 7.2* 7.7*  7.8*  MG  --  1.6*  --   --   PHOS  --  3.2  --   --      Liver Function Tests: Recent Labs  Lab 08/31/22 0449  AST 11*  ALT 10  ALKPHOS 97  BILITOT 0.3  PROT 4.3*  ALBUMIN <1.5*    No results for input(s): "LIPASE", "AMYLASE" in the last 168 hours. No results for input(s): "AMMONIA" in the last 168 hours.  CBC: Recent Labs  Lab 08/30/22 0527 08/31/22 0449 09/01/22 0344 09/02/22 0623  WBC 24.2* 24.2* 23.8* 26.1*  NEUTROABS  --   --   --  19.4*  HGB 10.4* 8.9* 9.9* 9.2*  HCT 32.8* 27.8* 30.3* 28.4*  MCV 88.6 88.3 86.8 86.1  PLT 518* 451* 430* 349     Cardiac Enzymes: No results for input(s): "CKTOTAL", "CKMB", "CKMBINDEX", "TROPONINI" in the last 168 hours.  BNP: Invalid input(s): "POCBNP"  CBG: Recent Labs  Lab 09/01/22 1118 09/01/22 1119 09/01/22 1621 09/01/22 2059 09/02/22 0815  GLUCAP 354* 342* 245* 315* 247*     Microbiology: Results for orders placed or performed during the hospital encounter of 08/30/22  Culture, blood (Routine X 2) w Reflex to ID Panel     Status: None (Preliminary result)   Collection Time: 08/30/22  5:13 PM   Specimen: BLOOD  Result Value Ref Range Status   Specimen Description BLOOD BLOOD RIGHT ARM  Final   Special Requests   Final    BOTTLES DRAWN AEROBIC AND ANAEROBIC Blood Culture adequate volume  Culture   Final    NO GROWTH 3 DAYS Performed at [facility], [address] [place], [place] [address]    Report Status PENDING  Incomplete  MRSA Next Gen by PCR, Nasal     Status: None   Collection Time: 08/30/22  6:36 PM   Specimen: Nasal Mucosa; Nasal Swab  Result Value Ref Range Status   MRSA by PCR Next Gen NOT DETECTED NOT DETECTED Final    Comment: (NOTE) The GeneXpert MRSA Assay (FDA approved for NASAL specimens only), is one component of a comprehensive MRSA colonization surveillance program. It is not intended to diagnose MRSA infection nor to guide or monitor treatment for MRSA  infections. Test performance is not FDA approved in patients less than 64 years old. Performed at [facility], [address] [place], [place] [address]   Culture, blood (Routine X 2) w Reflex to ID Panel     Status: None (Preliminary result)   Collection Time: 08/30/22 10:15 PM   Specimen: BLOOD  Result Value Ref Range Status   Specimen Description BLOOD BLOOD RIGHT HAND  Final   Special Requests   Final    BOTTLES DRAWN AEROBIC AND ANAEROBIC Blood Culture adequate volume   Culture   Final    NO GROWTH 3 DAYS Performed at [facility], [address], [place], [place] [address]    Report Status PENDING  Incomplete  Respiratory (~20 pathogens) panel by PCR     Status: None   Collection Time: 08/31/22  5:42 PM   Specimen: Nasopharyngeal Swab; Respiratory  Result Value Ref Range Status   Adenovirus NOT DETECTED NOT DETECTED Final   Coronavirus 229E NOT DETECTED NOT DETECTED Final    Comment: (NOTE) The Coronavirus on the Respiratory Panel, DOES NOT test for the novel  Coronavirus (2019 nCoV)    Coronavirus HKU1 NOT DETECTED NOT DETECTED Final   Coronavirus NL63 NOT DETECTED NOT DETECTED Final   Coronavirus OC43 NOT DETECTED NOT DETECTED Final   Metapneumovirus NOT DETECTED NOT DETECTED Final   Rhinovirus / Enterovirus NOT DETECTED NOT DETECTED Final   Influenza A NOT DETECTED NOT DETECTED Final   Influenza B NOT DETECTED NOT DETECTED Final   Parainfluenza Virus 1 NOT DETECTED NOT DETECTED Final   Parainfluenza Virus 2 NOT DETECTED NOT DETECTED Final   Parainfluenza Virus 3 NOT DETECTED NOT DETECTED Final   Parainfluenza Virus 4 NOT DETECTED NOT DETECTED Final   Respiratory Syncytial Virus NOT DETECTED NOT DETECTED Final   Bordetella pertussis NOT DETECTED NOT DETECTED Final   Bordetella Parapertussis NOT DETECTED NOT DETECTED Final   Chlamydophila pneumoniae NOT DETECTED NOT DETECTED Final   Mycoplasma pneumoniae NOT DETECTED NOT DETECTED Final     Comment: Performed at [facility], [place]. [address], [place], [place] [address]  Respiratory (~20 pathogens) panel by PCR     Status: None   Collection Time: 08/31/22 11:20 PM   Specimen: Nasopharyngeal Swab; Respiratory  Result Value Ref Range Status   Adenovirus NOT DETECTED NOT DETECTED Final   Coronavirus 229E NOT DETECTED NOT DETECTED Final    Comment: (NOTE) The Coronavirus on the Respiratory Panel, DOES NOT test for the novel  Coronavirus (2019 nCoV)    Coronavirus HKU1 NOT DETECTED NOT DETECTED Final   Coronavirus NL63 NOT DETECTED NOT DETECTED Final   Coronavirus OC43 NOT DETECTED NOT DETECTED Final   Metapneumovirus NOT DETECTED NOT DETECTED Final   Rhinovirus / Enterovirus NOT DETECTED NOT DETECTED Final  Influenza A NOT DETECTED NOT DETECTED Final   Influenza B NOT DETECTED NOT DETECTED Final   Parainfluenza Virus 1 NOT DETECTED NOT DETECTED Final   Parainfluenza Virus 2 NOT DETECTED NOT DETECTED Final   Parainfluenza Virus 3 NOT DETECTED NOT DETECTED Final   Parainfluenza Virus 4 NOT DETECTED NOT DETECTED Final   Respiratory Syncytial Virus NOT DETECTED NOT DETECTED Final   Bordetella pertussis NOT DETECTED NOT DETECTED Final   Bordetella Parapertussis NOT DETECTED NOT DETECTED Final   Chlamydophila pneumoniae NOT DETECTED NOT DETECTED Final   Mycoplasma pneumoniae NOT DETECTED NOT DETECTED Final    Comment: Performed at [facility], [place] [address], [place], [place] [address]    Coagulation Studies: No results for input(s): "LABPROT", "INR" in the last 72 hours.   Urinalysis: No results for input(s): "COLORURINE", "LABSPEC", "PHURINE", "GLUCOSEU", "HGBUR", "BILIRUBINUR", "KETONESUR", "PROTEINUR", "UROBILINOGEN", "NITRITE", "LEUKOCYTESUR" in the last 72 hours.  Invalid input(s): "APPERANCEUR"     Imaging: No results found.   Medications:    heparin 1,300 Units/hr (09/02/22 [id number])   piperacillin-tazobactam (ZOSYN)  IV 3.375 g (09/02/22 0526)      budesonide (PULMICORT) nebulizer solution  0.5 mg Nebulization BID   feeding supplement  237 mL Oral BID BM   feeding supplement (GLUCERNA SHAKE)  237 mL Oral BID BM   insulin aspart  0-5 Units Subcutaneous QHS   insulin aspart  0-9 Units Subcutaneous TID WC   insulin aspart  3 Units Subcutaneous TID WC   insulin glargine-yfgn  15 Units Subcutaneous BID   ipratropium-albuterol  3 mL Nebulization BID   predniSONE  50 mg Oral Q breakfast   sulfamethoxazole-trimethoprim  1 tablet Oral Once per day on Mon Wed Fri   tacrolimus  2 mg Oral BID   torsemide  20 mg Oral Daily   benzonatate, chlorpheniramine-HYDROcodone, HYDROmorphone (DILAUDID) injection, sodium chloride  Assessment/ Plan:  [name] is a 22 y.o. 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 Most recent urine with 5.6 grams of proteinuria on 07/13/2022. With hypoalbuminemia Continue Prednisone and Tacrolimus Tacrolimus level ordered in a.m. Awaiting urine protein creatinine ratio TMP/SMX and Torsemide '20mg'$  daily  Acute pulmonary embolism: with hypoalbuminemia and nephrotic syndrome. Was discharged on apixaban.   Heparin drip remains in place. Interdisciplinary teams patient to unstable at this time for active intervention, thrombectomy, bronchoscopy, IVC filter.. Monitoring closely  Hypotension: Chronic condition for this patient. Continue supportive care.   Secondary  Hyperparathyroidism: not currently on an activated vitamin D.  Calcium slightly decreased at 7.8 however slowly improving over the course of this admission.  5. Hyperkalemia,  should correct with Torsemide.   Potassium has corrected to 4.4.   LOS: 3   3/2/202410:23 AM

## 2022-09-02 NOTE — Progress Notes (Addendum)
Progress Note   Patient: Catherine [name] [id number] DOB: Mar 26, 2001 DOA: 08/30/2022     3 DOS: the patient was seen and examined on 09/02/2022   Subjective: Patient seen and examined at bedside this morning Complaining of headache that was relieved by Tylenol Patient has now been weaned off of intranasal oxygen Denies worsening abdominal pain chest pain or cough   Brief Narrative:   22 y.o. female with medical history significant of multiple medical issues including type 1 diabetes poorly controlled, hypoalbuminemia, stage III CKD, nephrotic syndrome, tobacco abuse, marijuana abuse presenting with recurrent pulmonary embolus with infarcts.  Patient noted to have been admitted February 20 through 23 for similar issues with noted pulmonary embolus with right heart strain that required thrombectomy by Dr. Lucky Cowboy on February 20 as well as other issues including cavitary lung masses, nephrotic syndrome and minimal-change disease.  Please see discharge summary for further details.  Patient was noted to have been evaluated at [facility] back in January and discharged on Eliquis with concern for significant hypoalbuminemia.  Patient was continued on IV anticoagulation and transition to Eliquis at discharge pending follow-up during most recent hospitalization.  Patient reports worsening shortness of breath as well as chest pain over the past 2 to 3 days.  Worsening pleuritic chest pain. EKG was sinus tachycardia.  CTA of the chest with right middle lobe infarct, bulky bilateral pulmonary embolism noted from prior imaging.  Positive nonocclusive thrombus in the left lower lobe similar to prior.  RV to LV ratio of 2.3, cavitary lesions in the lateral and posterior right upper lobe similar to prior.  2/29: Seen in consultation by vascular surgery.  No indication at this point for repeat thrombectomy.  Seen in consultation by pulmonary to consider bronchoscopy however patient continued to high risk at this point  and no indication for bronc.  Wanting sputum for AFB assessment however as of this time patient is unable to produce sputum.  Has been clinically improving well on intravenous Zosyn.    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 Recently evaluated by nephrology during recent admission Started on prednisone 50 mg as well as prophylactic Bactrim and tacrolimus Plan: Will continue home medications for now Does have some minimal to mild generalized edema on exam Currently torsemide on hold as patient's blood pressure has been borderline low Nephrology on board and case discussed   HTN (hypertension) BP stable Titrate home regimen   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 (  [place]) with hyperglycemia Poorly controlled w/ most recent A1C 9.1 08/25/22 Discussed importance of blood sugar controlled   SSI  Lantus added to patient's medication We will continue to monitor blood glucose closely in the setting of current steroid use   Leukocytosis WBC 24 on presentation  Suspect multifactorial in setting of steroid use, pulmonary infarcts as well as cavitary lesions Fairly stable in review of trend from prior admission Will monitor for now Continue treatment regimen Reassess if there is a significant uptrend or if there is a clinical decline in the patient Follow closely Zosyn discontinued according to ID recommendation   Cavitary lesion of lung Cavitary lesions of the lung relatively unchanged from prior admission Plan: nebulizer therapy.   Will continue we will like to obtain sputum for AFB as soon as patient is able to produce.  Infectious disease and pulmonologist on board we appreciate input    tobacco dependence Patient ports no longer smoking Follow   Marijuana abuse Patient reports no longer smoking Will monitor for now     DVT prophylaxis: Heparin GTT   Code Status: Full code   Family Communication: Mother and father at bedside 2/29, 3/1   Disposition Plan: Status is: Inpatient   Remains inpatient appropriate because: Cavitary lesions, unclear etiology.    Multiple consultants involved including pulmonology, infectious disease, nephrology     Level of care: Telemetry Medical   Consultants:  Pulmonary ID Vascular surgery     Physical Exam: Vitals reviewed and documented separately General exam: Appears fatigued Respiratory system: Scattered wheezes, rhonchi, rales.  Normal work of breathing.  3 L Cardiovascular system: S1-S2, RRR, no murmurs, trace pedal edema BLE Gastrointestinal system: Soft, NT/ND, normal bowel sounds Central nervous system: Alert and oriented. No focal neurological deficits. Extremities: Symmetric 5 x 5 power. Skin: No rashes, lesions or ulcers Psychiatry: Judgement and insight appear normal. Mood & affect appropriate.    Data  Reviewed:  Blood sugar recordings reviewed by me as well as other labs   Family Communication: Discussed with patient's mother    Disposition: Status is: Inpatient .  Planned Discharge Destination: Pending clinical course  Vitals:   09/02/22 0742 09/02/22 0812 09/02/22 1212 09/02/22 1614  BP:  122/74 102/72 106/73  Pulse:  97 96 (!) 103  Resp:  '17 16 17  '$ Temp:  97.6 F (36.4 C) 97.6 F (36.4 C) 98 F (36.7 C)  TempSrc:      SpO2: 100% 94% 98% 98%  Weight:      Height:        Time spent: 45 minutes  Author: Verline Lema, MD 09/02/2022 5:18 PM  For on call review [email or web address].

## 2022-09-02 NOTE — Progress Notes (Signed)
Patient noted to have low blood pressure & slight decreased respirations after receiving dilaudid 0.'5mg'$  IV. Denies symptoms. HOB was elevated. Lowered to approx. 17*. Enc controlled breathing. B/p  and respirations (105/69 & 20) increased to safe range. Day RN made aware.

## 2022-09-02 NOTE — Progress Notes (Signed)
ANTICOAGULATION CONSULT NOTE  Pharmacy Consult for Heparin  Indication: pulmonary embolus  No Known Allergies  Patient Measurements: Height: '4\' 11"'$  (149.9 cm) Weight: 58.3 kg (128 lb 9.6 oz) IBW/kg (Calculated) : 43.2 Heparin DW: 55.3 kg  Vital Signs: Temp: 98.5 F (36.9 C) (03/02 0037) BP: 105/69 (03/02 0037) Pulse Rate: 103 (03/02 0037)  Labs: Recent Labs    08/30/22 0527 08/30/22 0648 08/30/22 1512 08/31/22 0449 08/31/22 1110 08/31/22 2012 09/01/22 0344 09/01/22 1056 09/01/22 1928 09/02/22 0110  HGB 10.4*  --   --  8.9*  --   --  9.9*  --   --   --   HCT 32.8*  --   --  27.8*  --   --  30.3*  --   --   --   PLT 518*  --   --  451*  --   --  430*  --   --   --   APTT 33  --    < > 77* 64*   < > 111* 104* 77* 82*  LABPROT 17.1*  --   --   --   --   --   --   --   --   --   INR 1.4*  --   --   --   --   --   --   --   --   --   HEPARINUNFRC >1.10*  --   --  0.22* 0.17*  --  0.34  --   --  0.35  CREATININE 1.09*  --   --  0.99  --   --  1.25*  --   --   --   TROPONINIHS 17 22*  --   --   --   --   --   --   --   --    < > = values in this interval not displayed.     Estimated Creatinine Clearance: 54.8 mL/min (A) (by C-G formula based on SCr of 1.25 mg/dL (H)).   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 female with medical history significant of multiple medical issues including type 1 diabetes poorly controlled, hypoalbuminemia, stage III CKD, nephrotic syndrome, tobacco abuse, marijuana abuse presenting with recurrent pulmonary embolus with infarcts. Pt was on apixaban PTA.   2/29 1110 HL 0.17  2/29 2012 aPTT 61   3/01 @ 0344:  aPTT = 111,  HL = 0.34 3/01 @  1056:  aPTT =  104, supratherapeutic  3/01 '@1930'$  aPTT =  77, therapeutic 3/02 '@0110'$  :  aPTT = 82,  HL = 0.35    Goal of Therapy:  Heparin level 0.3-0.7 units/ml aPTT 66-102 seconds Monitor platelets by anticoagulation protocol: Yes   Plan:  3/02 @ 0110:  aPTT = 82,  HL = 0.35 - aPTT is therapeutic X 2 and now correlating with HL  - will use HL to guide dosing from here on - Will continue pt on current rate and recheck HL on 3/03 with AM labs.  -Check CBC on 3/2 with AM labs  Prisila Dlouhy D, PharmD Clinical Pharmacist 09/02/2022 1:55 AM

## 2022-09-03 LAB — CBC WITH DIFFERENTIAL/PLATELET
Abs Immature Granulocytes: 1.1 10*3/uL — ABNORMAL HIGH (ref 0.00–0.07)
Basophils Absolute: 0.1 10*3/uL (ref 0.0–0.1)
Basophils Relative: 0 %
Eosinophils Absolute: 0.1 10*3/uL (ref 0.0–0.5)
Eosinophils Relative: 0 %
HCT: 27.5 % — ABNORMAL LOW (ref 36.0–46.0)
Hemoglobin: 9 g/dL — ABNORMAL LOW (ref 12.0–15.0)
Immature Granulocytes: 5 %
Lymphocytes Relative: 29 %
Lymphs Abs: 6.7 10*3/uL — ABNORMAL HIGH (ref 0.7–4.0)
MCH: 28.4 pg (ref 26.0–34.0)
MCHC: 32.7 g/dL (ref 30.0–36.0)
MCV: 86.8 fL (ref 80.0–100.0)
Monocytes Absolute: 1.7 10*3/uL — ABNORMAL HIGH (ref 0.1–1.0)
Monocytes Relative: 8 %
Neutro Abs: 13.4 10*3/uL — ABNORMAL HIGH (ref 1.7–7.7)
Neutrophils Relative %: 58 %
Platelets: 231 10*3/uL (ref 150–400)
RBC: 3.17 MIL/uL — ABNORMAL LOW (ref 3.87–5.11)
RDW: 14.6 % (ref 11.5–15.5)
WBC: 23.1 10*3/uL — ABNORMAL HIGH (ref 4.0–10.5)
nRBC: 0.1 % (ref 0.0–0.2)

## 2022-09-03 LAB — GLUCOSE, CAPILLARY
Glucose-Capillary: 258 mg/dL — ABNORMAL HIGH (ref 70–99)
Glucose-Capillary: 202 mg/dL — ABNORMAL HIGH (ref 70–99)
Glucose-Capillary: 143 mg/dL — ABNORMAL HIGH (ref 70–99)
Glucose-Capillary: 188 mg/dL — ABNORMAL HIGH (ref 70–99)

## 2022-09-03 LAB — HEPARIN LEVEL (UNFRACTIONATED)
Heparin Unfractionated: 0.28 IU/mL — ABNORMAL LOW (ref 0.30–0.70)
Heparin Unfractionated: 0.64 IU/mL (ref 0.30–0.70)
Heparin Unfractionated: 0.65 IU/mL (ref 0.30–0.70)

## 2022-09-03 LAB — BASIC METABOLIC PANEL
Anion gap: 6 (ref 5–15)
BUN: 34 mg/dL — ABNORMAL HIGH (ref 6–20)
CO2: 18 mmol/L — ABNORMAL LOW (ref 22–32)
Calcium: 7.6 mg/dL — ABNORMAL LOW (ref 8.9–10.3)
Chloride: 109 mmol/L (ref 98–111)
Creatinine, Ser: 1.51 mg/dL — ABNORMAL HIGH (ref 0.44–1.00)
GFR, Estimated: 50 mL/min — ABNORMAL LOW (ref >60)
Glucose, Bld: 296 mg/dL — ABNORMAL HIGH (ref 70–99)
Potassium: 4.3 mmol/L (ref 3.5–5.1)
Sodium: 133 mmol/L — ABNORMAL LOW (ref 135–145)

## 2022-09-03 MED ORDER — HEPARIN BOLUS VIA INFUSION
800.0000 [IU] | Freq: Once | INTRAVENOUS | Status: AC
  Administered 2022-09-03: 800 [IU] via INTRAVENOUS
  Filled 2022-09-03: qty 800

## 2022-09-03 MED ORDER — OXYCODONE HCL 5 MG PO TABS
10.0000 mg | ORAL_TABLET | Freq: Four times a day (QID) | ORAL | Status: DC | PRN
  Administered 2022-09-03 – 2022-09-04 (×3): 10 mg via ORAL
  Filled 2022-09-03 (×3): qty 2

## 2022-09-03 NOTE — Progress Notes (Signed)
ANTICOAGULATION CONSULT NOTE  Pharmacy Consult for Heparin  Indication: pulmonary embolus  No Known Allergies  Patient Measurements: Height: '4\' 11"'$  (149.9 cm) Weight: 58.3 kg (128 lb 9.6 oz) IBW/kg (Calculated) : 43.2 Heparin DW: 55.3 kg  Vital Signs: Temp: 98.3 F (36.8 C) (03/03 2010) BP: 112/76 (03/03 2010) Pulse Rate: 101 (03/03 2010)  Labs: Recent Labs    09/01/22 0344 09/01/22 1056 09/01/22 1928 09/02/22 0110 09/02/22 0623 09/03/22 0528 09/03/22 1416 09/03/22 2220  HGB 9.9*  --   --   --  9.2* 9.0*  --   --   HCT 30.3*  --   --   --  28.4* 27.5*  --   --   PLT 430*  --   --   --  349 231  --   --   APTT 111* 104* 77* 82*  --   --   --   --   HEPARINUNFRC 0.34  --   --  0.35  --  0.28* 0.64 0.65  CREATININE 1.25*  --   --   --  1.42* 1.51*  --   --      Estimated Creatinine Clearance: 45.4 mL/min (A) (by C-G formula based on SCr of 1.51 mg/dL (H)).   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 female with medical history significant of multiple medical issues including type 1 diabetes poorly controlled, hypoalbuminemia, stage III CKD, nephrotic syndrome, tobacco abuse, marijuana abuse presenting with recurrent pulmonary embolus with infarcts. Pt was on apixaban PTA.   2/29 1110 HL 0.17  2/29 2012 aPTT 61   3/01 @ 0344:  aPTT = 111,  HL = 0.34 3/01 @ 1056:  aPTT =  104, supratherapeutic  3/01 @ 1930 aPTT =  77, therapeutic 3/02 @ 0110 :  aPTT = 82,  HL = 0.35 3/03 @ 0528 :  HL = 0.28, SUBtherapeutic  3/03 @ 1416 :  HL = 0.64, Therapeutic x 1 3/04 @ 2220 :  HL = 0.65, therapeutic X 2    Goal of Therapy:  Heparin level 0.3-0.7 units/ml Monitor platelets by  anticoagulation protocol: Yes   Plan:  3/03:  HL @ 2220 = 0.65, therapeutic X 2  Continue heparin infusion at 1400 units/hr Will recheck HL on 3/04 with AM labs.  Continue to monitor H&H and platelets daily while on heparin gtt.  Daniesha Driver D 09/03/2022 11:07 PM

## 2022-09-03 NOTE — Progress Notes (Signed)
[facility]  ROUNDING NOTE   Subjective:   [name] was admitted to [facility] on 08/30/2022 for Pulmonary embolism ([place]) [I26.99] Hypoglycemia [E16.2] History of nephrotic syndrome [Z87.441] Type 1 diabetes mellitus with other specified complication ([place]) 123XX123 Chest pain, unspecified type [R07.9] Pulmonary embolism, unspecified chronicity, unspecified pulmonary embolism type, unspecified whether acute cor pulmonale present [facility]) [I26.99]  Patient is known to our practice and was seen during previous admissions.   Update Patient seen resting quietly in dark room No family at bedside No complaints to offer at this time Patient interested in discharge.  Objective:  Vital signs in last 24 hours:  Temp:  [97.6 F (36.4 C)-98.3 F (36.8 C)] 97.9 F (36.6 C) (03/03 0815) Pulse Rate:  [91-107] 91 (03/03 0815) Resp:  [16-20] 18 (03/03 0815) BP: (100-110)/(57-73) 104/68 (03/03 0815) SpO2:  [94 %-98 %] 95 % (03/03 0818)  Weight change:  Filed Weights   08/30/22 0439 08/30/22 0559  Weight: 64.5 kg 58.3 kg    Intake/Output: I/O last 3 completed shifts: In: 1629.5 [P.O.:540; I.V.:939.5; IV Piggyback:150] Out: 1 [Stool:1]   Intake/Output this shift:  No intake/output data recorded.  Physical Exam: General: NAD  Head: Normocephalic, atraumatic. Moist oral mucosal membranes, facial edema  Eyes: Anicteric  Lungs:  Clear to auscultation, normal effort  Heart: Regular rate and rhythm  Abdomen:  Soft, nontender  Extremities:  no peripheral edema.  Neurologic: Nonfocal, moving all four extremities  Skin: No lesions        Basic Metabolic Panel: Recent Labs  Lab 08/30/22 0527 08/31/22 0449 09/01/22 0344 09/02/22 0623 09/03/22 0528  NA 136 134* 129* 132* 133*  K 3.6 4.2 5.9* 4.4 4.3  CL 107 108 102 107 109  CO2 19* 23 18* 19* 18*  GLUCOSE 53* 238* 448* 273* 296*  BUN 29* 19 22* 30* 34*  CREATININE 1.09* 0.99 1.25* 1.42* 1.51*  CALCIUM 7.0*  7.2* 7.7* 7.8* 7.6*  MG  --  1.6*  --   --   --   PHOS  --  3.2  --   --   --      Liver Function Tests: Recent Labs  Lab 08/31/22 0449  AST 11*  ALT 10  ALKPHOS 97  BILITOT 0.3  PROT 4.3*  ALBUMIN <1.5*    No results for input(s): "LIPASE", "AMYLASE" in the last 168 hours. No results for input(s): "AMMONIA" in the last 168 hours.  CBC: Recent Labs  Lab 08/30/22 0527 08/31/22 0449 09/01/22 0344 09/02/22 0623 09/03/22 0528  WBC 24.2* 24.2* 23.8* 26.1* 23.1*  NEUTROABS  --   --   --  19.4* 13.4*  HGB 10.4* 8.9* 9.9* 9.2* 9.0*  HCT 32.8* 27.8* 30.3* 28.4* 27.5*  MCV 88.6 88.3 86.8 86.1 86.8  PLT 518* 451* 430* 349 231     Cardiac Enzymes: No results for input(s): "CKTOTAL", "CKMB", "CKMBINDEX", "TROPONINI" in the last 168 hours.  BNP: Invalid input(s): "POCBNP"  CBG: Recent Labs  Lab 09/02/22 0815 09/02/22 1216 09/02/22 1740 09/02/22 2006 09/03/22 0811  GLUCAP 247* 231* 323* 280* 40*     Microbiology: Results for orders placed or performed during the hospital encounter of 08/30/22  Culture, blood (Routine X 2) w Reflex to ID Panel     Status: None (Preliminary result)   Collection Time: 08/30/22  5:13 PM   Specimen: BLOOD  Result Value Ref Range Status   Specimen Description BLOOD BLOOD RIGHT ARM  Final   Special Requests  Final    BOTTLES DRAWN AEROBIC AND ANAEROBIC Blood Culture adequate volume   Culture   Final    NO GROWTH 4 DAYS Performed at [facility], [address] [place], [place] [address]    Report Status PENDING  Incomplete  MRSA Next Gen by PCR, Nasal     Status: None   Collection Time: 08/30/22  6:36 PM   Specimen: Nasal Mucosa; Nasal Swab  Result Value Ref Range Status   MRSA by PCR Next Gen NOT DETECTED NOT DETECTED Final    Comment: (NOTE) The GeneXpert MRSA Assay (FDA approved for NASAL specimens only), is one component of a comprehensive MRSA colonization surveillance program. It is not intended to diagnose  MRSA infection nor to guide or monitor treatment for MRSA infections. Test performance is not FDA approved in patients less than 43 years old. Performed at [facility], [address] [place], [place] [address]   Culture, blood (Routine X 2) w Reflex to ID Panel     Status: None (Preliminary result)   Collection Time: 08/30/22 10:15 PM   Specimen: BLOOD  Result Value Ref Range Status   Specimen Description BLOOD BLOOD RIGHT HAND  Final   Special Requests   Final    BOTTLES DRAWN AEROBIC AND ANAEROBIC Blood Culture adequate volume   Culture   Final    NO GROWTH 4 DAYS Performed at [facility], [address] [place], [place] [address]    Report Status PENDING  Incomplete  Respiratory (~20 pathogens) panel by PCR     Status: None   Collection Time: 08/31/22  5:42 PM   Specimen: Nasopharyngeal Swab; Respiratory  Result Value Ref Range Status   Adenovirus NOT DETECTED NOT DETECTED Final   Coronavirus 229E NOT DETECTED NOT DETECTED Final    Comment: (NOTE) The Coronavirus on the Respiratory Panel, DOES NOT test for the novel  Coronavirus (2019 nCoV)    Coronavirus HKU1 NOT DETECTED NOT DETECTED Final   Coronavirus NL63 NOT DETECTED NOT DETECTED Final   Coronavirus OC43 NOT DETECTED NOT DETECTED Final   Metapneumovirus NOT DETECTED NOT DETECTED Final   Rhinovirus / Enterovirus NOT DETECTED NOT DETECTED Final   Influenza A NOT DETECTED NOT DETECTED Final   Influenza B NOT DETECTED NOT DETECTED Final   Parainfluenza Virus 1 NOT DETECTED NOT DETECTED Final   Parainfluenza Virus 2 NOT DETECTED NOT DETECTED Final   Parainfluenza Virus 3 NOT DETECTED NOT DETECTED Final   Parainfluenza Virus 4 NOT DETECTED NOT DETECTED Final   Respiratory Syncytial Virus NOT DETECTED NOT DETECTED Final   Bordetella pertussis NOT DETECTED NOT DETECTED Final   Bordetella Parapertussis NOT DETECTED NOT DETECTED Final   Chlamydophila pneumoniae NOT DETECTED NOT DETECTED Final    Mycoplasma pneumoniae NOT DETECTED NOT DETECTED Final    Comment: Performed at [facility], [place]. [address], [place], [place] [address]  Respiratory (~20 pathogens) panel by PCR     Status: None   Collection Time: 08/31/22 11:20 PM   Specimen: Nasopharyngeal Swab; Respiratory  Result Value Ref Range Status   Adenovirus NOT DETECTED NOT DETECTED Final   Coronavirus 229E NOT DETECTED NOT DETECTED Final    Comment: (NOTE) The Coronavirus on the Respiratory Panel, DOES NOT test for the novel  Coronavirus (2019 nCoV)    Coronavirus HKU1 NOT DETECTED NOT DETECTED Final   Coronavirus NL63 NOT DETECTED NOT DETECTED Final   Coronavirus OC43 NOT DETECTED NOT DETECTED Final   Metapneumovirus NOT  DETECTED NOT DETECTED Final   Rhinovirus / Enterovirus NOT DETECTED NOT DETECTED Final   Influenza A NOT DETECTED NOT DETECTED Final   Influenza B NOT DETECTED NOT DETECTED Final   Parainfluenza Virus 1 NOT DETECTED NOT DETECTED Final   Parainfluenza Virus 2 NOT DETECTED NOT DETECTED Final   Parainfluenza Virus 3 NOT DETECTED NOT DETECTED Final   Parainfluenza Virus 4 NOT DETECTED NOT DETECTED Final   Respiratory Syncytial Virus NOT DETECTED NOT DETECTED Final   Bordetella pertussis NOT DETECTED NOT DETECTED Final   Bordetella Parapertussis NOT DETECTED NOT DETECTED Final   Chlamydophila pneumoniae NOT DETECTED NOT DETECTED Final   Mycoplasma pneumoniae NOT DETECTED NOT DETECTED Final    Comment: Performed at [facility], [place] [address] [place], [place] [address]    Coagulation Studies: No results for input(s): "LABPROT", "INR" in the last 72 hours.   Urinalysis: No results for input(s): "COLORURINE", "LABSPEC", "PHURINE", "GLUCOSEU", "HGBUR", "BILIRUBINUR", "KETONESUR", "PROTEINUR", "UROBILINOGEN", "NITRITE", "LEUKOCYTESUR" in the last 72 hours.  Invalid input(s): "APPERANCEUR"     Imaging: No results found.   Medications:    heparin 1,400 Units/hr (09/03/22 0740)      budesonide (PULMICORT) nebulizer solution  0.5 mg Nebulization BID   feeding supplement  237 mL Oral BID BM   feeding supplement (GLUCERNA SHAKE)  237 mL Oral BID BM   insulin aspart  0-5 Units Subcutaneous QHS   insulin aspart  0-9 Units Subcutaneous TID WC   insulin aspart  3 Units Subcutaneous TID WC   insulin glargine-yfgn  15 Units Subcutaneous BID   ipratropium-albuterol  3 mL Nebulization BID   predniSONE  50 mg Oral Q breakfast   sulfamethoxazole-trimethoprim  1 tablet Oral Once per day on Mon Wed Fri   tacrolimus  2 mg Oral BID   torsemide  20 mg Oral Daily   acetaminophen, benzonatate, chlorpheniramine-HYDROcodone, HYDROmorphone (DILAUDID) injection, oxyCODONE, sodium chloride  Assessment/ Plan:  [name] is a 22 y.o. black female with minimal change disease on tacrolimus, diabetes mellitus type I, hypertension, hyperlipidemia, who was admitted to [facility] on 08/30/2022 for Pulmonary embolism ([place]) [I26.99] Hypoglycemia [E16.2] History of nephrotic syndrome [Z87.441] Type 1 diabetes mellitus with other specified complication ([place]) 123XX123 Chest pain, unspecified type [R07.9] Pulmonary embolism, unspecified chronicity, unspecified pulmonary embolism type, unspecified whether acute cor pulmonale present ([place]) [I26.99]  Chronic kidney disease stage IIIA with minimal change disease and nephrotic syndrome. Most recent urine with 5.6 grams of proteinuria on 07/13/2022. With hypoalbuminemia Continue Prednisone and Tacrolimus Tacrolimus level pending Will reorder urine protein creatinine ratio TMP/SMX and Torsemide '20mg'$  daily  Acute pulmonary embolism: with hypoalbuminemia and nephrotic syndrome. Was discharged on apixaban.   Heparin drip remains in place. Interdisciplinary teams patient to unstable at this time for active intervention, thrombectomy, bronchoscopy, IVC filter.. Will defer anticoagulation and discharge planning to primary team.  Hypotension: Chronic  condition for this patient.  Blood pressure currently 104/68, acceptable for this patient.  Secondary Hyperparathyroidism: not currently on an activated vitamin D.  Calcium remains decreased but stable, 7.6 today.  Patient encouraged to increase oral intake.  5. Hyperkalemia,  should correct with Torsemide.   Potassium stable   LOS: 4   3/3/202411:22 AM

## 2022-09-03 NOTE — Progress Notes (Signed)
Progress Note   Patient: Catherine [name] [id number] DOB: [birth year] DOA: 08/30/2022     4 DOS: the patient was seen and examined on 09/03/2022    Subjective: Patient seen and examined at bedside this morning Headache is much better She tells me her shortness of breath is improved Continues to be on heparin drip Denies worsening abdominal pain chest pain or cough  Review of system: Negative except as mentioned above   Brief Narrative:   22 y.o. female with medical history significant of multiple medical issues including type 1 diabetes poorly controlled, hypoalbuminemia, stage III CKD, nephrotic syndrome, tobacco abuse, marijuana abuse presenting with recurrent pulmonary embolus with infarcts.  Patient noted to have been admitted February 20 through 23 for similar issues with noted pulmonary embolus with right heart strain that required thrombectomy by Dr. Lucky Cowboy on February 20 as well as other issues including cavitary lung masses, nephrotic syndrome and minimal-change disease.  Please see discharge summary for further details.  Patient was noted to have been evaluated at [facility] back in January and discharged on Eliquis with concern for significant hypoalbuminemia.  Patient was continued on IV anticoagulation and transition to Eliquis at discharge pending follow-up during most recent hospitalization.  Patient reports worsening shortness of breath as well as chest pain over the past 2 to 3 days.  Worsening pleuritic chest pain. EKG was sinus tachycardia.  CTA of the chest with right middle lobe infarct, bulky bilateral pulmonary embolism noted from prior imaging.  Positive nonocclusive thrombus in the left lower lobe similar to prior.  RV to LV ratio of 2.3, cavitary lesions in the lateral and posterior right upper lobe similar to prior.  2/29: Seen in consultation by vascular surgery.  No indication at this point for repeat thrombectomy.  Seen in consultation by pulmonary to consider  bronchoscopy however patient continued to high risk at this point and no indication for bronc.  Wanting sputum for AFB assessment however as of this time patient is unable to produce sputum.      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 The patient and her father reports compliance with Eliquis Patient states she has not smoked tobacco or marijuana since discharge CT angio chest with noted recurrent bulky bilateral pulmonary embolus and as well as pulmonary infarcts Plan: Continue IV heparin GTT for now Case was discussed with CT vacs and does not recommend repeat thrombectomy Blood work shows low Antithrombin III as well as protein S levels but normal protein C levels. 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 Recently evaluated by nephrology during recent admission Started on prednisone 50 mg as well as prophylactic Bactrim and tacrolimus Plan: Will continue home medications for now Does have some minimal to mild generalized edema on exam Currently torsemide on hold as patient's blood pressure has been borderline low Nephrology on board and case  discussed   HTN (hypertension) BP stable Titrate home regimen   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 (HCC) with hyperglycemia Poorly controlled w/ most recent A1C  9.1 08/25/22 Discussed importance of blood sugar controlled  SSI  Lantus added to patient's medication We will continue to monitor blood glucose closely in the setting of current steroid use   Leukocytosis in the setting of steroid use WBC 24 on presentation  Suspect multifactorial in setting of steroid use, pulmonary infarcts as well as cavitary lesions Fairly stable in review of trend from prior admission Will monitor for now Continue treatment regimen Zosyn discontinued according to ID recommendation   Cavitary lesion of lung Cavitary lesions of the lung relatively unchanged from prior admission Plan: nebulizer therapy.   Will continue we will like to obtain sputum for AFB as soon as patient is able to produce.  Infectious disease and pulmonologist on board we appreciate input    tobacco dependence Patient ports no longer smoking Follow   Marijuana abuse Patient reports no longer smoking Will monitor for now     DVT prophylaxis: Heparin GTT   Code Status: Full code   Family Communication: Mother and father at bedside 2/29, 3/1/ 3/3   Disposition Plan: Status is: Inpatient   Remains inpatient appropriate because: Cavitary lesions, unclear etiology.    Multiple consultants involved including pulmonology, infectious disease, nephrology     Level of care: Telemetry Medical     Physical Exam: Vitals reviewed and documented separately General exam: Appears fatigued Respiratory system: No more wheezing with normal work of breathing Cardiovascular system: S1-S2, RRR, no murmurs, trace pedal edema BLE Gastrointestinal system: Soft, NT/ND, normal bowel sounds Central nervous system: Alert and oriented. No focal neurological deficits. Extremities: Symmetric 5 x 5 power. Skin: No rashes, lesions or ulcers Psychiatry: Judgement and insight appear normal. Mood & affect appropriate.      Consultants:  Pulmonary ID Vascular surgery Otology oncology Nephrologist     Vitals:   09/03/22 0645 09/03/22 0815 09/03/22 0818 09/03/22 1206  BP: 104/70 104/68  109/70  Pulse: 93 91  100  Resp: '16 18  19  '$ Temp: 98 F (36.7 C) 97.9 F (36.6 C)  98.4 F (36.9 C)  TempSrc:      SpO2: 94% 95% 95% 96%  Weight:      Height:        Author: Verline Lema, MD 09/03/2022 3:08 PM  For on call review [email or web address].

## 2022-09-03 NOTE — Progress Notes (Addendum)
ANTICOAGULATION CONSULT NOTE  Pharmacy Consult for Heparin  Indication: pulmonary embolus  No Known Allergies  Patient Measurements: Height: '4\' 11"'$  (149.9 cm) Weight: 58.3 kg (128 lb 9.6 oz) IBW/kg (Calculated) : 43.2 Heparin DW: 55.3 kg  Vital Signs: Temp: 98.4 F (36.9 C) (03/03 1206) BP: 109/70 (03/03 1206) Pulse Rate: 100 (03/03 1206)  Labs: Recent Labs    09/01/22 0344 09/01/22 1056 09/01/22 1928 09/02/22 0110 09/02/22 0623 09/03/22 0528  HGB 9.9*  --   --   --  9.2* 9.0*  HCT 30.3*  --   --   --  28.4* 27.5*  PLT 430*  --   --   --  349 231  APTT 111* 104* 77* 82*  --   --   HEPARINUNFRC 0.34  --   --  0.35  --  0.28*  CREATININE 1.25*  --   --   --  1.42* 1.51*     Estimated Creatinine Clearance: 45.4 mL/min (A) (by C-G formula based on SCr of 1.51 mg/dL (H)).   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 female with medical history significant of multiple medical issues including type 1 diabetes poorly controlled, hypoalbuminemia, stage III CKD, nephrotic syndrome, tobacco abuse, marijuana abuse presenting with recurrent pulmonary embolus with infarcts. Pt was on apixaban PTA.   2/29 1110 HL 0.17  2/29 2012 aPTT 61   3/01 @ 0344:  aPTT = 111,  HL = 0.34 3/01 @ 1056:  aPTT =  104, supratherapeutic  3/01 @ 1930 aPTT =  77, therapeutic 3/02 @ 0110 :  aPTT = 82,  HL = 0.35 3/03 @ 0528 :  HL = 0.28, SUBtherapeutic  3/03 @ 1416 :  HL = 0.64, Therapeutic x 1   Goal of Therapy:  Heparin level 0.3-0.7 units/ml Monitor platelets by anticoagulation protocol: Yes   Plan:  Continue heparin infusion at 1400 units/hr Check anti-Xa level in 6 hours and daily once  consecutively therapeutic. Continue to monitor H&H and platelets daily while on heparin gtt.  Will M. Ouida Sills, PharmD PGY-1 Pharmacy Resident 09/03/2022 2:42 PM

## 2022-09-03 NOTE — Progress Notes (Signed)
[place] for Heparin  Indication: pulmonary embolus  No Known Allergies  Patient Measurements: Height: '4\' 11"'$  (149.9 cm) Weight: 58.3 kg (128 lb 9.6 oz) IBW/kg (Calculated) : 43.2 Heparin DW: 55.3 kg  Vital Signs: Temp: 98 F (36.7 C) (03/03 0001) BP: 110/72 (03/03 0001) Pulse Rate: 104 (03/03 0001)  Labs: Recent Labs    09/01/22 0344 09/01/22 1056 09/01/22 1928 09/02/22 0110 09/02/22 0623 09/03/22 0528  HGB 9.9*  --   --   --  9.2* 9.0*  HCT 30.3*  --   --   --  28.4* 27.5*  PLT 430*  --   --   --  349 231  APTT 111* 104* 77* 82*  --   --   HEPARINUNFRC 0.34  --   --  0.35  --  0.28*  CREATININE 1.25*  --   --   --  1.42* 1.51*     Estimated Creatinine Clearance: 45.4 mL/min (A) (by C-G formula based on SCr of 1.51 mg/dL (H)).   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 female with medical history significant of multiple medical issues including type 1 diabetes poorly controlled, hypoalbuminemia, stage III CKD, nephrotic syndrome, tobacco abuse, marijuana abuse presenting with recurrent pulmonary embolus with infarcts. Pt was on apixaban PTA.   2/29 1110 HL 0.17  2/29 2012 aPTT 61   3/01 @ 0344:  aPTT = 111,  HL = 0.34 3/01 @ 1056:  aPTT =  104, supratherapeutic  3/01 '@1930'$  aPTT =  77, therapeutic 3/02 '@0110'$  :  aPTT = 82,  HL = 0.35 3/03 '@0528'$  :  HL = 0.28, SUBtherapeutic     Goal of Therapy:  Heparin level 0.3-0.7 units/ml aPTT 66-102 seconds Monitor platelets by anticoagulation protocol: Yes   Plan:  3/03:  HL @ 0528 = 0.28, SUBtherapeutic  - Will order heparin 800 units IV X 1 and increase drip rate to 1400  units/hr.  - Will recheck HL 6 hrs after rate change.   Orene Desanctis, PharmD Clinical Pharmacist 09/03/2022 6:23 AM

## 2022-09-04 ENCOUNTER — Other Ambulatory Visit: Payer: Self-pay | Admitting: Oncology

## 2022-09-04 LAB — CBC WITH DIFFERENTIAL/PLATELET
Abs Immature Granulocytes: 1.23 10*3/uL — ABNORMAL HIGH (ref 0.00–0.07)
Basophils Absolute: 0.1 10*3/uL (ref 0.0–0.1)
Basophils Relative: 1 %
Eosinophils Absolute: 0.1 10*3/uL (ref 0.0–0.5)
Eosinophils Relative: 0 %
HCT: 29.8 % — ABNORMAL LOW (ref 36.0–46.0)
Hemoglobin: 9.7 g/dL — ABNORMAL LOW (ref 12.0–15.0)
Immature Granulocytes: 5 %
Lymphocytes Relative: 28 %
Lymphs Abs: 7.5 10*3/uL — ABNORMAL HIGH (ref 0.7–4.0)
MCH: 28.2 pg (ref 26.0–34.0)
MCHC: 32.6 g/dL (ref 30.0–36.0)
MCV: 86.6 fL (ref 80.0–100.0)
Monocytes Absolute: 1.8 10*3/uL — ABNORMAL HIGH (ref 0.1–1.0)
Monocytes Relative: 7 %
Neutro Abs: 15.8 10*3/uL — ABNORMAL HIGH (ref 1.7–7.7)
Neutrophils Relative %: 59 %
Platelets: 118 10*3/uL — ABNORMAL LOW (ref 150–400)
RBC: 3.44 MIL/uL — ABNORMAL LOW (ref 3.87–5.11)
RDW: 14.6 % (ref 11.5–15.5)
WBC: 26.5 10*3/uL — ABNORMAL HIGH (ref 4.0–10.5)
nRBC: 0.1 % (ref 0.0–0.2)

## 2022-09-04 LAB — BASIC METABOLIC PANEL
Anion gap: 7 (ref 5–15)
BUN: 32 mg/dL — ABNORMAL HIGH (ref 6–20)
CO2: 19 mmol/L — ABNORMAL LOW (ref 22–32)
Calcium: 7.8 mg/dL — ABNORMAL LOW (ref 8.9–10.3)
Chloride: 109 mmol/L (ref 98–111)
Creatinine, Ser: 1.37 mg/dL — ABNORMAL HIGH (ref 0.44–1.00)
GFR, Estimated: 56 mL/min — ABNORMAL LOW (ref >60)
Glucose, Bld: 229 mg/dL — ABNORMAL HIGH (ref 70–99)
Potassium: 4.1 mmol/L (ref 3.5–5.1)
Sodium: 135 mmol/L (ref 135–145)

## 2022-09-04 LAB — FUNGITELL BETA-D-GLUCAN
Fungitell Value:: <31.25 pg/mL
Result Name:: NEGATIVE

## 2022-09-04 LAB — CULTURE, BLOOD (ROUTINE X 2)
Culture: NO GROWTH
Culture: NO GROWTH
Special Requests: ADEQUATE
Special Requests: ADEQUATE

## 2022-09-04 LAB — HEPARIN LEVEL (UNFRACTIONATED): Heparin Unfractionated: 0.55 IU/mL (ref 0.30–0.70)

## 2022-09-04 LAB — PROTEIN / CREATININE RATIO, URINE
Creatinine, Urine: 14 mg/dL
Protein Creatinine Ratio: 22.36 mg/mg{Cre} — ABNORMAL HIGH (ref 0.00–0.15)
Total Protein, Urine: 313 mg/dL

## 2022-09-04 LAB — GLUCOSE, CAPILLARY
Glucose-Capillary: 156 mg/dL — ABNORMAL HIGH (ref 70–99)
Glucose-Capillary: 154 mg/dL — ABNORMAL HIGH (ref 70–99)
Glucose-Capillary: 152 mg/dL — ABNORMAL HIGH (ref 70–99)
Glucose-Capillary: 231 mg/dL — ABNORMAL HIGH (ref 70–99)

## 2022-09-04 MED ORDER — WARFARIN SODIUM 2 MG PO TABS
2.0000 mg | ORAL_TABLET | Freq: Every day | ORAL | Status: DC
  Administered 2022-09-04 – 2022-09-05 (×2): 2 mg via ORAL
  Filled 2022-09-04 (×2): qty 1

## 2022-09-04 MED ORDER — WARFARIN - PHYSICIAN DOSING INPATIENT: Freq: Every day | Status: DC

## 2022-09-04 MED ORDER — ENOXAPARIN SODIUM 60 MG/0.6ML IJ SOSY
1.0000 mg/kg | PREFILLED_SYRINGE | Freq: Two times a day (BID) | INTRAMUSCULAR | Status: DC
  Administered 2022-09-04 – 2022-09-05 (×2): 57.5 mg via SUBCUTANEOUS
  Filled 2022-09-04 (×3): qty 0.6

## 2022-09-04 NOTE — Inpatient Diabetes Management (Signed)
Inpatient Diabetes Program Recommendations  AACE/ADA: New Consensus Statement on Inpatient Glycemic Control   Target Ranges:  Prepandial:   less than 140 mg/dL      Peak postprandial:   less than 180 mg/dL (1-2 hours)      Critically ill patients:  140 - 180 mg/dL    Latest Reference Range & Units 09/03/22 08:11 09/03/22 12:02 09/03/22 16:34 09/03/22 20:16 09/04/22 08:08  Glucose-Capillary 70 - 99 mg/dL 258 (H) 202 (H) 143 (H) 188 (H) 156 (H)   Review of Glycemic Control  Diabetes history: DM1 Outpatient Diabetes medications: Lantus 22 units QHS, Novolog 1 unit per 8 grams of carbs, Novolog 1 unit per 50 mg/dl above target glucose of 150 mg/dl Current orders for Inpatient glycemic control: Semglee 15 units BID, Novolog 0-9 units TID with meals, Novolog 0-5 units QHS, Novolog 3 units TID with meals; Prednisone 50 mg QAM  Inpatient Diabetes Program Recommendations:    Insulin: If steroids are continued as ordered, please consider increasing meal coverage to Novolog 5 units TID with meals if patient eats at least 50% of meals.  Thanks, Barnie Alderman, RN, MSN, [place] Diabetes Coordinator Inpatient Diabetes Program [phone] (Team Pager from 8am to [place])

## 2022-09-04 NOTE — Consult Note (Signed)
Hematology/Oncology Consult note [phone] [phone]      Patient Care Team: Center, [facility] as PCP - General   Name of the patient: Catherine [name]  US:[id number]  Jul 24, 2000   REASON FOR COSULTATION:   History of presenting illness-  22 y.o. female with PMH listed at below who is currently admitted due to recurrent pulmonary embolism with right heart strain.  Patient has a chronic history of nephrotic syndrome, with hyperal albumin anemia.  Currently on prednisone and tacrolimus.  She follows up with Dr. Candiss Norse. 06/30/2022 - 07/05/2022 patient was admitted at [facility] due to AKI on CKD, proteinuria, hypoalbuminemia Ultrasound of bilateral lower extremity was negative for DVT. Patient was started on Eliquis 5 mg twice daily for prophylactic anticoagulation due to the concern of hypercoagulability secondary to hypoalbuminemia/nephrotic syndrome.  Patient took 30 days of Eliquis and ran out after that.  08/22/2022 - 08/25/2022, patient was hospitalized at [facility] due to shortness of breath, 08/22/2021, CT chest PE: Showed acute submassive PE with right heart strain.  Multifocal bilateral upper lobe nodules-favor infectious process.  Multifocal groundglass opacities with subpleural sparing likely reflects combination of pulmonary hemorrhage and/for any infectious or inflammatory etiology. Patient was treated with heparin drip, transition to Eliquis. She underwent thrombolysis and mechanical embolectomy by Dr. Lucky Cowboy on 08/22/2022.  08/30/2022, patient returned to the emergency room due to shortness of breath. Repeat CT chest angiogram showed interval development of confluent patchy and posterior consolidation airspace disease in the right middle lobe suggesting infarct. Bulky bilateral pulmonary embolism again noted, occlusive in right middle lobe lobar branch and segmental branches to the right lower lobe, similar to prior. Medial segmental and subsegmental  branches to the right lower lobe are patent on the current study despite being occluded previously. Small volume occlusive thrombus in a subsegmental right upper lobe branches similar to prior.  No change in the nonocclusive thrombus in the segmental branches to the left lower lobe.  Enlargement of pulmonary outflow tract/main pulmonary arteries.  Cavitary lesions in the lateral and posterior right upper lobe.  Nodular and patchy areas of consolidative airspace disease Patient was restarted on heparin drip.  Hematology oncology was consulted for outpatient anticoagulation regimen. Patient was seen by infectious disease physician for pulmonary infiltrates.  Seen by infectious disease. Also seen by pulmonology.  At this time she was considered high risk for bronchoscopy . Today patient with was feeling well.  Shortness of breath has improved since that admission.  Mother at the bedside   No Known Allergies  Patient Active Problem List   Diagnosis Date Noted   History of nephrotic syndrome 09/02/2022   Hypoglycemia 09/02/2022   Acute hypoxic respiratory failure ([place]) 09/01/2022   Pulmonary embolism ([place]) 08/30/2022   Cavitary lesion of lung 08/30/2022   Leukocytosis 08/30/2022   Pulmonary embolism with acute cor pulmonale ([place]) 08/22/2022   Hypoalbuminemia 08/22/2022   Marijuana abuse 08/22/2022   Tobacco dependence 08/22/2022   Abdominal pain 09/26/2021   Hypokalemia 09/26/2021   Dyslipidemia 09/26/2021   HCAP (healthcare-associated pneumonia) 09/26/2021   Severe sepsis with septic shock (CODE) ([place]) 09/26/2021   Hypomagnesemia 09/26/2021   Pseudohyponatremia 09/04/2021   HTN (hypertension) 02/10/2021   DKA (diabetic ketoacidosis) ([place]) 04/18/2020   Type 1 diabetes mellitus with other specified complication ([place]) [id number]   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   Proteinuria 08/23/2019   DM (diabetes mellitus) type I uncontrolled with renal manifestation 08/23/2019   AKI (acute kidney  injury) ([place]) 08/21/2019   Nephrotic  syndrome due to type 1 diabetes mellitus and history of minimal change disease    Hyponatremia    Acidosis    Type 1 diabetes mellitus with hyperglycemia ([place]) 10/12/2005     Past Medical History:  Diagnosis Date   Diabetes mellitus without complication ([place])    Dyslipidemia    Hypoalbuminemia    Marijuana abuse    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   Nephrotic syndrome 08/23/2019   Tobacco dependence      Past Surgical History:  Procedure Laterality Date   PULMONARY THROMBECTOMY Bilateral 08/22/2022   Procedure: PULMONARY THROMBECTOMY;  Surgeon: Algernon Huxley, MD;  Location: [place] [facility];  Service: Cardiovascular;  Laterality: Bilateral;    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  Tobacco Use   Smoking status: Every Day    Types: E-cigarettes   Smokeless tobacco: Never  Vaping Use   Vaping Use: Never used  Substance and Sexual Activity   Alcohol use: Not Currently   Drug use: Yes    Types: Marijuana    Comment: daily use   Sexual activity: Not Currently  Other Topics Concern   Not on file  Social History Narrative   Not on file   Social Determinants of Health   Financial Resource Strain: Not on file  Food Insecurity: No Food Insecurity (08/30/2022)   Hunger Vital Sign    Worried About Running Out of Food in the Last Year: Never true    Ran Out of Food in the Last Year: Never true  Transportation Needs: No Transportation Needs (08/30/2022)   PRAPARE - Hydrologist (Medical): No    Lack of Transportation (Non-Medical): No  Physical Activity: Not on file  Stress: Not on file  Social Connections: Not on file  Intimate Partner Violence: Not At Risk (08/30/2022)   Humiliation,  Afraid, Rape, and Kick questionnaire    Fear of Current or Ex-Partner: No    Emotionally Abused: No    Physically Abused: No    Sexually Abused: No     History reviewed. No pertinent family history.   Current Facility-Administered Medications:    acetaminophen (TYLENOL) tablet 650 mg, 650 mg, Oral, Q6H PRN, Verline Lema, MD, 650 mg at 09/02/22 1723   benzonatate (TESSALON) capsule 200 mg, 200 mg, Oral, TID PRN, Deneise Lever, MD, 200 mg at 09/03/22 2232   budesonide (PULMICORT) nebulizer solution 0.5 mg, 0.5 mg, Nebulization, BID, Kasa, Kurian, MD, 0.5 mg at 09/04/22 0817   chlorpheniramine-HYDROcodone (TUSSIONEX) 10-8 MG/5ML suspension 5 mL, 5 mL, Oral, Q12H PRN, Deneise Lever, MD, 5 mL at 09/04/22 0914   feeding supplement (ENSURE ENLIVE / ENSURE PLUS) liquid 237 mL, 237 mL, Oral, BID BM, Breeze, Shantelle, NP, 237 mL at 09/04/22 0923   feeding supplement (GLUCERNA SHAKE) (GLUCERNA SHAKE) liquid 237 mL, 237 mL, Oral, BID BM, Sreenath, Sudheer B, MD, 237 mL at 09/04/22 0923   [COMPLETED] heparin bolus via infusion 4,000 Units, 4,000 Units, Intravenous, Once, 4,000 Units at 08/30/22 0908 **FOLLOWED BY** heparin ADULT infusion 100 units/mL (25000 units/254m), 1,400 Units/hr, Intravenous, Continuous, Djan, Prince T, MD, Last Rate: 14 mL/hr at 09/04/22 0128, 1,400 Units/hr at 09/04/22 0128   HYDROmorphone (DILAUDID) injection 0.5 mg,  0.5 mg, Intravenous, Q4H PRN, Deneise Lever, MD, 0.5 mg at 09/02/22 2251   insulin aspart (novoLOG) injection 0-5 Units, 0-5 Units, Subcutaneous, QHS, Deneise Lever, MD, 3 Units at 09/02/22 2106   insulin aspart (novoLOG) injection 0-9 Units, 0-9 Units, Subcutaneous, TID WC, Deneise Lever, MD, 2 Units at 09/04/22 1253   insulin aspart (novoLOG) injection 3 Units, 3 Units, Subcutaneous, TID WC, Deneise Lever, MD, 3 Units at 09/04/22 1253   insulin glargine-yfgn (SEMGLEE) injection 15 Units, 15 Units, Subcutaneous, BID, Djan, Prince T, MD, 15 Units  at 09/04/22 0914   ipratropium-albuterol (DUONEB) 0.5-2.5 (3) MG/3ML nebulizer solution 3 mL, 3 mL, Nebulization, BID, Sreenath, Sudheer B, MD, 3 mL at 09/04/22 0816   oxyCODONE (Oxy IR/ROXICODONE) immediate release tablet 10 mg, 10 mg, Oral, Q6H PRN, Marguerita Merles T, MD, 10 mg at 09/03/22 2231   predniSONE (DELTASONE) tablet 50 mg, 50 mg, Oral, Q breakfast, Breeze, Shantelle, NP, 50 mg at 09/04/22 0913   sodium chloride 0.9 % nebulizer solution 3 mL, 3 mL, Nebulization, Q8H PRN, Priscella Mann, Sudheer B, MD   sulfamethoxazole-trimethoprim (BACTRIM DS) 800-160 MG per tablet 1 tablet, 1 tablet, Oral, Once per day on Mon Wed Fri, Kolluru, Sarath, MD, 1 tablet at 09/04/22 0913   tacrolimus (PROGRAF) capsule 2 mg, 2 mg, Oral, BID, Breeze, Shantelle, NP, 2 mg at 09/04/22 0914   torsemide (DEMADEX) tablet 20 mg, 20 mg, Oral, Daily, Breeze, Shantelle, NP, 20 mg at 09/04/22 0913  Review of Systems  Constitutional:  Negative for chills, fatigue and fever.  HENT:   Negative for hearing loss and voice change.   Eyes:  Negative for eye problems.  Respiratory:  Positive for cough and shortness of breath. Negative for chest tightness.   Cardiovascular:  Negative for chest pain.  Gastrointestinal:  Negative for abdominal distention, abdominal pain and blood in stool.  Endocrine: Negative for hot flashes.  Genitourinary:  Negative for difficulty urinating and frequency.   Musculoskeletal:  Negative for arthralgias.  Skin:  Negative for itching and rash.  Neurological:  Negative for extremity weakness.  Hematological:  Negative for adenopathy.  Psychiatric/Behavioral:  Negative for confusion.     PHYSICAL EXAM Vitals:   09/04/22 0817 09/04/22 0901 09/04/22 0938 09/04/22 1206  BP:   113/65 112/72  Pulse: (!) 112 (!) 105 100 94  Resp: '18  16 18  '$ Temp:   98.4 F (36.9 C) 98.1 F (36.7 C)  TempSrc:   Oral Oral  SpO2: 91%  94% 93%  Weight:      Height:       Physical Exam Constitutional:      General:  She is not in acute distress.    Comments: Patient sits in the bed  HENT:     Head: Normocephalic and atraumatic.     Nose: Nose normal.     Mouth/Throat:     Pharynx: No oropharyngeal exudate.  Eyes:     General: No scleral icterus. Cardiovascular:     Rate and Rhythm: Normal rate.     Heart sounds: No murmur heard. Pulmonary:     Effort: Pulmonary effort is normal. No respiratory distress.     Breath sounds: No wheezing.  Abdominal:     General: There is no distension.     Palpations: Abdomen is soft.  Musculoskeletal:        General: Normal range of motion.     Cervical back: Neck supple.  Skin:    General: Skin is  warm and dry.     Findings: No erythema.  Neurological:     Mental Status: She is alert and oriented to person, place, and time. Mental status is at baseline.     Cranial Nerves: No cranial nerve deficit.     Motor: No abnormal muscle tone.  Psychiatric:        Mood and Affect: Mood and affect normal.       LABORATORY STUDIES    Latest Ref Rng & Units 09/04/2022    4:50 AM 09/03/2022    5:28 AM 09/02/2022    6:23 AM  CBC  WBC 4.0 - 10.5 K/uL 26.5  23.1  26.1   Hemoglobin 12.0 - 15.0 g/dL 9.7  9.0  9.2   Hematocrit 36.0 - 46.0 % 29.8  27.5  28.4   Platelets 150 - 400 K/uL 118  231  349       Latest Ref Rng & Units 09/04/2022    4:50 AM 09/03/2022    5:28 AM 09/02/2022    6:23 AM  CMP  Glucose 70 - 99 mg/dL 229  296  273   BUN 6 - 20 mg/dL 32  34  30   Creatinine 0.44 - 1.00 mg/dL 1.37  1.51  1.42   Sodium 135 - 145 mmol/L 135  133  132   Potassium 3.5 - 5.1 mmol/L 4.1  4.3  4.4   Chloride 98 - 111 mmol/L 109  109  107   CO2 22 - 32 mmol/L '19  18  19   '$ Calcium 8.9 - 10.3 mg/dL 7.8  7.6  7.8      RADIOGRAPHIC STUDIES: I have personally reviewed the radiological images as listed and agreed with the findings in the report. CT Angio Chest PE W/Cm &/Or Wo Cm  Result Date: 08/30/2022 CLINICAL DATA:  Status post thrombectomy. History of pulmonary embolus.  EXAM: CT ANGIOGRAPHY CHEST WITH CONTRAST TECHNIQUE: Multidetector CT imaging of the chest was performed using the standard protocol during bolus administration of intravenous contrast. Multiplanar CT image reconstructions and MIPs were obtained to evaluate the vascular anatomy. RADIATION DOSE REDUCTION: This exam was performed according to the departmental dose-optimization program which includes automated exposure control, adjustment of the mA and/or kV according to patient size and/or use of iterative reconstruction technique. CONTRAST:  70m OMNIPAQUE IOHEXOL 350 MG/ML SOLN COMPARISON:  Chest x-ray earlier same day.  Chest CT 02/02/2023 FINDINGS: Cardiovascular: Heart size upper normal with trace pericardial effusion, similar to prior. No thoracic aortic aneurysm. Enlargement of the pulmonary outflow tract/main pulmonary arteries suggests pulmonary arterial hypertension. Bulky pulmonary embolism again noted bilaterally, occlusive in right middle lobe lobar branch and segmental branches to the right lower lobe. Medial segmental and subsegmental branches to the right lower lobe are patent on the current study despite being occluded previously. Small volume occlusive thrombus in a subsegmental right upper lobe branches similar to prior. No substantial change in appearance of nonocclusive thrombus in segmental branches to the left lower lobe with similar occlusion of some subsegmental left lower lobe pulmonary arteries. RV LV ratio is 2.3 today compared to 1.9 previously. Mediastinum/Nodes: No mediastinal lymphadenopathy. Consolidative airspace disease is seen in the right hilum. The esophagus has normal imaging features. There is no axillary lymphadenopathy. Lungs/Pleura: Similar patchy ground-glass opacity in both upper lobes. Interval development of confluent patchy and posterior consolidative airspace disease in the right middle lobe suggesting infarct. Cavitary lesions in the lateral and posterior right upper  lobe are similar to  prior. Nodular and patchy areas of consolidative airspace disease in the lower lungs again noted, stable to minimally progressive in the interval. No substantial pleural effusion. Upper Abdomen: Visualized portion of the upper abdomen is unremarkable. Musculoskeletal: No worrisome lytic or sclerotic osseous abnormality. Review of the MIP images confirms the above findings. IMPRESSION: 1. Interval development of confluent patchy and posterior consolidative airspace disease in the right middle lobe suggesting infarct. 2. Bulky bilateral pulmonary embolism again noted, occlusive in right middle lobe lobar branch and segmental branches to the right lower lobe, similar to prior. Medial segmental and subsegmental branches to the right lower lobe are patent on the current study despite being occluded previously. Small volume occlusive thrombus in a subsegmental right upper lobe branches similar to prior. 3. No substantial change in appearance of nonocclusive thrombus in segmental branches to the left lower lobe with similar occlusion of some subsegmental left lower lobe pulmonary arteries. 4. Enlargement of the pulmonary outflow tract/main pulmonary arteries suggests pulmonary arterial hypertension. RV/LV ratio is 2.3 today compared to 1.9 previously. 5. Cavitary lesions in the lateral and posterior right upper lobe are similar to prior. 6. Nodular and patchy areas of consolidative airspace disease in the lower lungs again noted, stable to minimally progressive in the interval. Critical Value/emergent results were called by telephone at the time of interpretation on 08/30/2022 at 7:59 am to provider Dr. Cherylann Banas, who verbally acknowledged these results. Electronically Signed   By: Misty Stanley M.D.   On: 08/30/2022 07:59   DG Chest Port 1 View  Result Date: 08/30/2022 CLINICAL DATA:  Chest pain.  Recent pulmonary thrombectomy. EXAM: PORTABLE CHEST 1 VIEW COMPARISON:  08/22/2022. FINDINGS: Left lung  clear. Similar patchy airspace disease in the right mid lung with progressive patchy consolidative opacity at the right base. Right mid and lower lung cavitary lesions again noted, better characterized on recent CT scan. Possible small right pleural effusion. The cardiopericardial silhouette is within normal limits for size. Telemetry leads overlie the chest. IMPRESSION: 1. Cavitary lesions again noted right mid lung and peripheral right lung base. 2. Interval progression of patchy consolidative opacity in the inferior right lung. Electronically Signed   By: Misty Stanley M.D.   On: 08/30/2022 05:46   US Venous Img Lower Bilateral (DVT)  Result Date: 08/23/2022 CLINICAL DATA:  Acute pulmonary emboli EXAM: BILATERAL LOWER EXTREMITY VENOUS DOPPLER ULTRASOUND TECHNIQUE: Gray-scale sonography with graded compression, as well as color Doppler and duplex ultrasound were performed to evaluate the lower extremity deep venous systems from the level of the common femoral vein and including the common femoral, femoral, profunda femoral, popliteal and calf veins including the posterior tibial, peroneal and gastrocnemius veins when visible. Spectral Doppler was utilized to evaluate flow at rest and with distal augmentation maneuvers in the common femoral, femoral and popliteal veins. COMPARISON:  None Available. FINDINGS: RIGHT LOWER EXTREMITY Common Femoral Vein: No evidence of thrombus. Normal compressibility, respiratory phasicity and response to augmentation. Saphenofemoral Junction: No evidence of thrombus. Normal compressibility and flow on color Doppler imaging. Profunda Femoral Vein: No evidence of thrombus. Normal compressibility and flow on color Doppler imaging. Femoral Vein: No evidence of thrombus. Normal compressibility, respiratory phasicity and response to augmentation. Popliteal Vein: No evidence of thrombus. Normal compressibility, respiratory phasicity and response to augmentation. Calf Veins: No evidence  of thrombus. Normal compressibility and flow on color Doppler imaging. Other: Superficial thrombosis noted of the proximal calf small saphenous vein. No propagation into the deep venous system. LEFT LOWER EXTREMITY  Common Femoral Vein: No evidence of thrombus. Normal compressibility, respiratory phasicity and response to augmentation. Saphenofemoral Junction: No evidence of thrombus. Normal compressibility and flow on color Doppler imaging. Profunda Femoral Vein: No evidence of thrombus. Normal compressibility and flow on color Doppler imaging. Femoral Vein: No evidence of thrombus. Normal compressibility, respiratory phasicity and response to augmentation. Popliteal Vein: No evidence of thrombus. Normal compressibility, respiratory phasicity and response to augmentation. Calf Veins: No evidence of thrombus. Normal compressibility and flow on color Doppler imaging. IMPRESSION: No evidence of deep venous thrombosis in either lower extremity. Right proximal calf small saphenous vein superficial thrombosis (very minor) Electronically Signed   By: Jerilynn Mages.  Shick M.D.   On: 08/23/2022 09:48   PERIPHERAL VASCULAR CATHETERIZATION  Result Date: 08/22/2022 See surgical note for result.  ECHOCARDIOGRAM COMPLETE  Result Date: 08/22/2022    ECHOCARDIOGRAM REPORT   Patient Name:   LENITA NISKANEN Date of Exam: 08/22/2022 Medical Rec #:  [id number]           Height:       59.0 in Accession #:    [id number]          Weight:       120.1 lb Date of Birth:  [birth year]           BSA:          1.485 m Patient Age:    21 years            BP:           90/42 mmHg Patient Gender: F                   HR:           103 bpm. Exam Location:  [facility] Procedure: 2D Echo, Color Doppler and Cardiac Doppler STAT ECHO Indications:     Pulmonary embolus I26.09  History:         Patient has prior history of Echocardiogram examinations, most                  recent 09/27/2021. Risk Factors:Diabetes and Dyslipidemia.                  Tobacco  dependence.  Sonographer:     Sherrie Sport Referring Phys:  Helen Diagnosing Phys: Nelva Bush MD IMPRESSIONS  1. Left ventricular ejection fraction, by estimation, is 60 to 65%. The left ventricle has normal function. The left ventricle has no regional wall motion abnormalities. There is mild left ventricular hypertrophy. Left ventricular diastolic parameters are consistent with Grade I diastolic dysfunction (impaired relaxation). There is the interventricular septum is flattened in systole and diastole, consistent with right ventricular pressure and volume overload.  2. Right ventricular systolic function demonstrates moderate hypokinesis of the free wall with sparing of the apex consistent with acute cor pulmonale (McConnell's sign). The right ventricular size is moderately enlarged. Moderately increased right ventricular wall thickness. There is severely elevated pulmonary artery systolic pressure. The estimated right ventricular systolic pressure is 123XX123 mmHg.  3. A small pericardial effusion is present.  4. The mitral valve is normal in structure. No evidence of mitral valve regurgitation. No evidence of mitral stenosis.  5. Tricuspid valve regurgitation is moderate to severe.  6. The aortic valve is tricuspid. Aortic valve regurgitation is not visualized. No aortic stenosis is present.  7. The inferior vena cava is normal in size with greater than 50% respiratory variability, suggesting right atrial pressure of 3  mmHg. FINDINGS  Left Ventricle: Left ventricular ejection fraction, by estimation, is 60 to 65%. The left ventricle has normal function. The left ventricle has no regional wall motion abnormalities. The left ventricular internal cavity size was normal in size. There is  mild left ventricular hypertrophy. The interventricular septum is flattened in systole and diastole, consistent with right ventricular pressure and volume overload. Left ventricular diastolic parameters are consistent  with Grade I diastolic dysfunction (impaired relaxation). Right Ventricle: The right ventricular size is moderately enlarged. Moderately increased right ventricular wall thickness. Right ventricular systolic function demonstrates moderate hypokinesis of the free wall with sparing of the apex consistent with acute cor pulmonale (McConnell's sign). There is severely elevated pulmonary artery systolic pressure. The tricuspid regurgitant velocity is 4.78 m/s, and with an assumed right atrial pressure of 3 mmHg, the estimated right ventricular systolic pressure is 123XX123 mmHg. Left Atrium: Left atrial size was normal in size. Right Atrium: Right atrial size was normal in size. Pericardium: A small pericardial effusion is present. Mitral Valve: The mitral valve is normal in structure. No evidence of mitral valve regurgitation. No evidence of mitral valve stenosis. Tricuspid Valve: The tricuspid valve is normal in structure. Tricuspid valve regurgitation is moderate to severe. Aortic Valve: The aortic valve is tricuspid. Aortic valve regurgitation is not visualized. No aortic stenosis is present. Aortic valve mean gradient measures 2.0 mmHg. Aortic valve peak gradient measures 3.1 mmHg. Aortic valve area, by VTI measures 2.33 cm. Pulmonic Valve: The pulmonic valve was not well visualized. Pulmonic valve regurgitation is mild. No evidence of pulmonic stenosis. Aorta: The aortic root is normal in size and structure. Pulmonary Artery: The pulmonary artery is not well seen. Venous: The inferior vena cava is normal in size with greater than 50% respiratory variability, suggesting right atrial pressure of 3 mmHg. IAS/Shunts: The interatrial septum was not well visualized.  LEFT VENTRICLE PLAX 2D LVIDd:         2.30 cm   Diastology LVIDs:         1.50 cm   LV e' medial:    5.55 cm/s LV PW:         1.20 cm   LV E/e' medial:  12.8 LV IVS:        1.20 cm   LV e' lateral:   10.60 cm/s LVOT diam:     1.80 cm   LV E/e' lateral: 6.7 LV  SV:         25 LV SV Index:   17 LVOT Area:     2.54 cm  RIGHT VENTRICLE RV Basal diam:  3.90 cm RV Mid diam:    3.25 cm RV S prime:     11.90 cm/s TAPSE (M-mode): 1.7 cm LEFT ATRIUM             Index       RIGHT ATRIUM           Index LA diam:        1.10 cm 0.74 cm/m  RA Area:     15.00 cm LA Vol (A2C):   10.6 ml 7.14 ml/m  RA Volume:   42.00 ml  28.28 ml/m LA Vol (A4C):   9.7 ml  6.54 ml/m LA Biplane Vol: 10.7 ml 7.20 ml/m  AORTIC VALVE                    PULMONIC VALVE AV Area (Vmax):    2.01 cm     PR End Diast  Vel: 25.00 msec AV Area (Vmean):   1.97 cm AV Area (VTI):     2.33 cm AV Vmax:           88.20 cm/s AV Vmean:          60.900 cm/s AV VTI:            0.107 m AV Peak Grad:      3.1 mmHg AV Mean Grad:      2.0 mmHg LVOT Vmax:         69.50 cm/s LVOT Vmean:        47.100 cm/s LVOT VTI:          0.098 m LVOT/AV VTI ratio: 0.92  AORTA Ao Root diam: 2.10 cm MITRAL VALVE                TRICUSPID VALVE MV Area (PHT): 6.07 cm     TR Peak grad:   91.4 mmHg MV Decel Time: 125 msec     TR Vmax:        478.00 cm/s MV E velocity: 71.30 cm/s MV A velocity: 113.00 cm/s  SHUNTS MV E/A ratio:  0.63         Systemic VTI:  0.10 m                             Systemic Diam: 1.80 cm Nelva Bush MD Electronically signed by Nelva Bush MD Signature Date/Time: 08/22/2022/2:28:41 PM    Final    CT Angio Chest PE W and/or Wo Contrast  Result Date: 08/22/2022 CLINICAL DATA:  Concern for pulmonary embolus. EXAM: CT ANGIOGRAPHY CHEST WITH CONTRAST TECHNIQUE: Multidetector CT imaging of the chest was performed using the standard protocol during bolus administration of intravenous contrast. Multiplanar CT image reconstructions and MIPs were obtained to evaluate the vascular anatomy. RADIATION DOSE REDUCTION: This exam was performed according to the departmental dose-optimization program which includes automated exposure control, adjustment of the mA and/or kV according to patient size and/or use of iterative  reconstruction technique. CONTRAST:  63m OMNIPAQUE IOHEXOL 350 MG/ML SOLN COMPARISON:  CT abdomen and pelvis September 26, 2021. FINDINGS: Cardiovascular: Filling defects within the right main pulmonary artery extending into multiple segmental and subsegmental branches predominantly involving the right lower and middle lobes. As well there are multiple left-sided segmental and subsegmental pulmonary artery filling defects. Right heart enlargement with a right ventricular to left ventricular ratio of 1.9. Normal caliber thoracic aorta. Mediastinum/Nodes: No suspicious thyroid nodule. No pathologically enlarged mediastinal or hilar lymph nodes. The esophagus is grossly unremarkable. Lungs/Pleura: Multifocal bilateral upper lobe predominant pulmonary nodules/masses many of which are cavitary. For reference: -cavitary right upper lobe pulmonary mass measures 3.4 x 2.0 cm on image 54/8 -cavitary left upper lobe pulmonary nodule measures 10 mm on image 32/8. Bronchiectasis with clustered nodularity in the dependent bilateral lower lobes. Multifocal ground-glass opacities with subpleural sparing may reflect pulmonary hemorrhage or an infectious or inflammatory etiology. No pleural effusion.  No pneumothorax. Upper Abdomen: Reflux of contrast into the IVC and hepatic veins. Hyperdensity vs focus of hyperenhancement in the right upper pole kidney on image 121/6. Musculoskeletal: No acute osseous abnormality. Review of the MIP images confirms the above findings. IMPRESSION: 1. Acute bilateral pulmonary emboli with CT evidence of right heart strain (RV/LV Ratio = 1.9 ) consistent with at least submassive (intermediate risk) PE. The presence of right heart strain has been associated with an increased risk of morbidity  and mortality. Please refer to the "Code PE Focused" order set in EPIC. 2. Multifocal bilateral upper lobe predominant pulmonary nodules/masses many of which are cavitary. Findings are favored to reflect an atypical  infectious process such as septic emboli. 3. Multifocal ground-glass opacities with subpleural sparing likely reflects a combination of pulmonary hemorrhage and/or an infectious or inflammatory etiology. 4. Hyperdensity vs focus of hyperenhancement in the right upper pole kidney, nonspecific suggest further evaluation with CT of the abdomen and pelvis upon stabilization of patient. Critical Value/emergent results were called by telephone at the time of interpretation on 08/22/2022 at 9:15 am to provider Dr Bland Span, who verbally acknowledged these results. Electronically Signed   By: Dahlia Bailiff M.D.   On: 08/22/2022 09:15   DG Chest Portable 1 View  Result Date: 08/22/2022 CLINICAL DATA:  22 year old female with history of shortness of breath. EXAM: PORTABLE CHEST 1 VIEW COMPARISON:  Chest x-ray 09/27/2021. FINDINGS: Widespread areas of interstitial prominence and architectural distortion are noted throughout the mid to lower lungs bilaterally (right greater than left), similar to the prior study from 09/26/2021, presumably areas of chronic post infectious or inflammatory scarring. No pleural effusions. No pneumothorax. No evidence of pulmonary edema. Heart size is normal. Upper mediastinal contours are within normal limits. IMPRESSION: 1. Grossly abnormal chest x-ray with findings which appear to be chronic based on comparison with prior chest radiographs. Lung bases were grossly abnormal on prior CT of the abdomen and pelvis 09/26/2021. Further evaluation with high-resolution chest CT is suggested to better delineate these findings which are very abnormal in a 22 year old patient. Electronically Signed   By: Vinnie Langton M.D.   On: 08/22/2022 06:58     Assessment and plan-   # Recurrent acute pulmonary embolism [place] secondary to the hypercoagulability secondary to nephrotic syndrome/hypoalbuminemia She received mechanical embolectomy during last admission and had a recurrent pulmonary embolism  despite taking Eliquis.  This is considered as Eliquis failure.  DOACs have limited data for effectiveness in nephrotic syndrome. Decreased Antithrombin III level and decreased protein S level probably is due to acute consumption due to active thrombosis, as well as nephrotic syndrome.  No family history of blood clots. Continue heparin GTT, I recommend Coumadin with an INR 2-3.  To minimize possible skin necrosis as a complication, recommend to start Coumadin at 2 mg daily for the first 3 days with gradual increase/titrate to until INR is therapeutic.  Bridged with heparin drip or Lovenox 1 mg/kg twice daily outpatient.  Discussed with Dr.Djan.  Patient will need to follow-up with hematology clinic, plan to repeat INR around 09/08/2022  # Cavitary lesion, high risk for bronchoscopy at this moment.  Continue follow-up with pulmonology outpatient # Nephrotic syndrome, follow-up with nephrology.  On prednisone and tacrolimus.  Thank you for allowing me to participate in the care of this patient.   Earlie Server, MD, PhD Hematology Oncology 09/04/2022

## 2022-09-04 NOTE — Progress Notes (Signed)
ANTICOAGULATION CONSULT NOTE  Pharmacy Consult for Heparin  Indication: pulmonary embolus  No Known Allergies  Patient Measurements: Height: '4\' 11"'$  (149.9 cm) Weight: 58.3 kg (128 lb 9.6 oz) IBW/kg (Calculated) : 43.2 Heparin DW: 55.3 kg  Vital Signs: Temp: 98 F (36.7 C) (03/04 0442) BP: 101/71 (03/04 0442) Pulse Rate: 86 (03/04 0442)  Labs: Recent Labs    09/01/22 1056 09/01/22 1928 09/02/22 0110 09/02/22 0110 09/02/22 0623 09/03/22 0528 09/03/22 1416 09/03/22 2220 09/04/22 0450  HGB  --   --   --    < > 9.2* 9.0*  --   --  9.7*  HCT  --   --   --   --  28.4* 27.5*  --   --  29.8*  PLT  --   --   --   --  349 231  --   --  118*  APTT 104* 77* 82*  --   --   --   --   --   --   HEPARINUNFRC  --   --  0.35   < >  --  0.28* 0.64 0.65 0.55  CREATININE  --   --   --   --  1.42* 1.51*  --   --  1.37*   < > = values in this interval not displayed.     Estimated Creatinine Clearance: 50 mL/min (A) (by C-G formula based on SCr of 1.37 mg/dL (H)).   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 female with medical history significant of multiple medical issues including type 1 diabetes poorly controlled, hypoalbuminemia, stage III CKD, nephrotic syndrome, tobacco abuse, marijuana abuse presenting with recurrent pulmonary embolus with infarcts. Pt was on apixaban PTA.   2/29 1110 HL 0.17  2/29 2012 aPTT 61   3/01 @ 0344:  aPTT = 111,  HL = 0.34 3/01 @ 1056:  aPTT =  104, supratherapeutic  3/01 @ 1930 aPTT =  77, therapeutic 3/02 @ 0110 :  aPTT = 82,  HL = 0.35 3/03 @ 0528 :  HL = 0.28, SUBtherapeutic  3/03 @ 1416 :  HL = 0.64, Therapeutic x 1 3/03 @ 2220 :  HL = 0.65,  therapeutic X 2  3/04 @ 0450 :  HL = 0.55, therapeutic X 3    Goal of Therapy:  Heparin level 0.3-0.7 units/ml Monitor platelets by anticoagulation protocol: Yes   Plan:  3/04:  HL @ 0450 = 0.55, therapeutic X 3  Continue heparin infusion at 1400 units/hr Will recheck HL on 3/05 with AM labs.  Continue to monitor H&H and platelets daily while on heparin gtt.  Rhyder Bratz D 09/04/2022 5:40 AM

## 2022-09-04 NOTE — Progress Notes (Signed)
[facility]  ROUNDING NOTE   Subjective:   [name] was admitted to [facility] on 08/30/2022 for Pulmonary embolism ([place]) [I26.99] Hypoglycemia [E16.2] History of nephrotic syndrome [Z87.441] Type 1 diabetes mellitus with other specified complication ([place]) 123XX123 Chest pain, unspecified type [R07.9] Pulmonary embolism, unspecified chronicity, unspecified pulmonary embolism type, unspecified whether acute cor pulmonale present [facility]) [I26.99]  Patient is known to our practice and was seen during previous admissions.   Patient seen resting in bed Alert and oriented Room air   Objective:  Vital signs in last 24 hours:  Temp:  [98 F (36.7 C)-98.4 F (36.9 C)] 98.1 F (36.7 C) (03/04 1206) Pulse Rate:  [86-112] 94 (03/04 1206) Resp:  [16-19] 18 (03/04 1206) BP: (101-113)/(65-77) 112/72 (03/04 1206) SpO2:  [91 %-94 %] 93 % (03/04 1206)  Weight change:  Filed Weights   08/30/22 0439 08/30/22 0559  Weight: 64.5 kg 58.3 kg    Intake/Output: I/O last 3 completed shifts: In: 1206 [P.O.:660; I.V.:546] Out: 1 [Stool:1]   Intake/Output this shift:  No intake/output data recorded.  Physical Exam: General: NAD  Head: Normocephalic, atraumatic. Moist oral mucosal membranes, facial edema  Eyes: Anicteric  Lungs:  Clear to auscultation, normal effort  Heart: Regular rate and rhythm  Abdomen:  Soft, nontender  Extremities:  no peripheral edema.  Neurologic: Nonfocal, moving all four extremities  Skin: No lesions        Basic Metabolic Panel: Recent Labs  Lab 08/31/22 0449 09/01/22 0344 09/02/22 0623 09/03/22 0528 09/04/22 0450  NA 134* 129* 132* 133* 135  K 4.2 5.9* 4.4 4.3 4.1  CL 108 102 107 109 109  CO2 23 18* 19* 18* 19*  GLUCOSE 238* 448* 273* 296* 229*  BUN 19 22* 30* 34* 32*  CREATININE 0.99 1.25* 1.42* 1.51* 1.37*  CALCIUM 7.2* 7.7* 7.8* 7.6* 7.8*  MG 1.6*  --   --   --   --   PHOS 3.2  --   --   --   --      Liver Function  Tests: Recent Labs  Lab 08/31/22 0449  AST 11*  ALT 10  ALKPHOS 97  BILITOT 0.3  PROT 4.3*  ALBUMIN <1.5*    No results for input(s): "LIPASE", "AMYLASE" in the last 168 hours. No results for input(s): "AMMONIA" in the last 168 hours.  CBC: Recent Labs  Lab 08/31/22 0449 09/01/22 0344 09/02/22 0623 09/03/22 0528 09/04/22 0450  WBC 24.2* 23.8* 26.1* 23.1* 26.5*  NEUTROABS  --   --  19.4* 13.4* 15.8*  HGB 8.9* 9.9* 9.2* 9.0* 9.7*  HCT 27.8* 30.3* 28.4* 27.5* 29.8*  MCV 88.3 86.8 86.1 86.8 86.6  PLT 451* 430* 349 231 118*     Cardiac Enzymes: No results for input(s): "CKTOTAL", "CKMB", "CKMBINDEX", "TROPONINI" in the last 168 hours.  BNP: Invalid input(s): "POCBNP"  CBG: Recent Labs  Lab 09/03/22 1202 09/03/22 1634 09/03/22 2016 09/04/22 0808 09/04/22 1203  GLUCAP 202* 143* 188* 156* 154*     Microbiology: Results for orders placed or performed during the hospital encounter of 08/30/22  Culture, blood (Routine X 2) w Reflex to ID Panel     Status: None   Collection Time: 08/30/22  5:13 PM   Specimen: BLOOD  Result Value Ref Range Status   Specimen Description BLOOD BLOOD RIGHT ARM  Final   Special Requests   Final    BOTTLES DRAWN AEROBIC AND ANAEROBIC Blood Culture adequate volume   Culture  Final    NO GROWTH 5 DAYS Performed at [facility], [address] [place], [place] [address]    Report Status 09/04/2022 FINAL  Final  MRSA Next Gen by PCR, Nasal     Status: None   Collection Time: 08/30/22  6:36 PM   Specimen: Nasal Mucosa; Nasal Swab  Result Value Ref Range Status   MRSA by PCR Next Gen NOT DETECTED NOT DETECTED Final    Comment: (NOTE) The GeneXpert MRSA Assay (FDA approved for NASAL specimens only), is one component of a comprehensive MRSA colonization surveillance program. It is not intended to diagnose MRSA infection nor to guide or monitor treatment for MRSA infections. Test performance is not FDA approved in patients  less than 61 years old. Performed at [facility], [address] [place], [place] [address]   Culture, blood (Routine X 2) w Reflex to ID Panel     Status: None   Collection Time: 08/30/22 10:15 PM   Specimen: BLOOD  Result Value Ref Range Status   Specimen Description BLOOD BLOOD RIGHT HAND  Final   Special Requests   Final    BOTTLES DRAWN AEROBIC AND ANAEROBIC Blood Culture adequate volume   Culture   Final    NO GROWTH 5 DAYS Performed at [facility], [address] [place], [place] [address]    Report Status 09/04/2022 FINAL  Final  Respiratory (~20 pathogens) panel by PCR     Status: None   Collection Time: 08/31/22  5:42 PM   Specimen: Nasopharyngeal Swab; Respiratory  Result Value Ref Range Status   Adenovirus NOT DETECTED NOT DETECTED Final   Coronavirus 229E NOT DETECTED NOT DETECTED Final    Comment: (NOTE) The Coronavirus on the Respiratory Panel, DOES NOT test for the novel  Coronavirus (2019 nCoV)    Coronavirus HKU1 NOT DETECTED NOT DETECTED Final   Coronavirus NL63 NOT DETECTED NOT DETECTED Final   Coronavirus OC43 NOT DETECTED NOT DETECTED Final   Metapneumovirus NOT DETECTED NOT DETECTED Final   Rhinovirus / Enterovirus NOT DETECTED NOT DETECTED Final   Influenza A NOT DETECTED NOT DETECTED Final   Influenza B NOT DETECTED NOT DETECTED Final   Parainfluenza Virus 1 NOT DETECTED NOT DETECTED Final   Parainfluenza Virus 2 NOT DETECTED NOT DETECTED Final   Parainfluenza Virus 3 NOT DETECTED NOT DETECTED Final   Parainfluenza Virus 4 NOT DETECTED NOT DETECTED Final   Respiratory Syncytial Virus NOT DETECTED NOT DETECTED Final   Bordetella pertussis NOT DETECTED NOT DETECTED Final   Bordetella Parapertussis NOT DETECTED NOT DETECTED Final   Chlamydophila pneumoniae NOT DETECTED NOT DETECTED Final   Mycoplasma pneumoniae NOT DETECTED NOT DETECTED Final    Comment: Performed at [facility], [place] [address] [place], [place] [address]   Respiratory (~20 pathogens) panel by PCR     Status: None   Collection Time: 08/31/22 11:20 PM   Specimen: Nasopharyngeal Swab; Respiratory  Result Value Ref Range Status   Adenovirus NOT DETECTED NOT DETECTED Final   Coronavirus 229E NOT DETECTED NOT DETECTED Final    Comment: (NOTE) The Coronavirus on the Respiratory Panel, DOES NOT test for the novel  Coronavirus (2019 nCoV)    Coronavirus HKU1 NOT DETECTED NOT DETECTED Final   Coronavirus NL63 NOT DETECTED NOT DETECTED Final   Coronavirus OC43 NOT DETECTED NOT DETECTED Final   Metapneumovirus NOT DETECTED NOT DETECTED Final   Rhinovirus / Enterovirus NOT DETECTED NOT DETECTED Final   Influenza A  NOT DETECTED NOT DETECTED Final   Influenza B NOT DETECTED NOT DETECTED Final   Parainfluenza Virus 1 NOT DETECTED NOT DETECTED Final   Parainfluenza Virus 2 NOT DETECTED NOT DETECTED Final   Parainfluenza Virus 3 NOT DETECTED NOT DETECTED Final   Parainfluenza Virus 4 NOT DETECTED NOT DETECTED Final   Respiratory Syncytial Virus NOT DETECTED NOT DETECTED Final   Bordetella pertussis NOT DETECTED NOT DETECTED Final   Bordetella Parapertussis NOT DETECTED NOT DETECTED Final   Chlamydophila pneumoniae NOT DETECTED NOT DETECTED Final   Mycoplasma pneumoniae NOT DETECTED NOT DETECTED Final    Comment: Performed at [facility], [place] [address] [place], [place] [address]    Coagulation Studies: No results for input(s): "LABPROT", "INR" in the last 72 hours.   Urinalysis: No results for input(s): "COLORURINE", "LABSPEC", "PHURINE", "GLUCOSEU", "HGBUR", "BILIRUBINUR", "KETONESUR", "PROTEINUR", "UROBILINOGEN", "NITRITE", "LEUKOCYTESUR" in the last 72 hours.  Invalid input(s): "APPERANCEUR"     Imaging: No results found.   Medications:    heparin 1,400 Units/hr (09/04/22 0128)     budesonide (PULMICORT) nebulizer solution  0.5 mg Nebulization BID   feeding supplement  237 mL Oral BID BM   feeding supplement (GLUCERNA  SHAKE)  237 mL Oral BID BM   insulin aspart  0-5 Units Subcutaneous QHS   insulin aspart  0-9 Units Subcutaneous TID WC   insulin aspart  3 Units Subcutaneous TID WC   insulin glargine-yfgn  15 Units Subcutaneous BID   ipratropium-albuterol  3 mL Nebulization BID   predniSONE  50 mg Oral Q breakfast   sulfamethoxazole-trimethoprim  1 tablet Oral Once per day on Mon Wed Fri   tacrolimus  2 mg Oral BID   torsemide  20 mg Oral Daily   acetaminophen, benzonatate, chlorpheniramine-HYDROcodone, HYDROmorphone (DILAUDID) injection, oxyCODONE, sodium chloride  Assessment/ Plan:  [name] is a 22 y.o. 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 Most recent urine with 5.6 grams of proteinuria on 07/13/2022. With hypoalbuminemia Continue Prednisone and Tacrolimus Tacrolimus level pending Requesting protein creatinine ratio  TMP/SMX and Torsemide '20mg'$  daily  Acute pulmonary embolism: with hypoalbuminemia and nephrotic syndrome. Was discharged on apixaban.   Heparin drip remains in place. IVC filter being considered.  Will discuss anticoagulation plan with team. Renal team is favoring Lovenox.   Hypotension: Chronic condition for this patient.  Blood pressure 112/72  Secondary Hyperparathyroidism: not currently on an activated vitamin D.  Calcium slowly improving, 7.8  5. Hyperkalemia,  should correct with Torsemide.   Potassium stable    LOS: 5   3/4/20242:19 PM

## 2022-09-04 NOTE — Progress Notes (Signed)
Progress Note   Patient: Catherine [name] [id number] DOB: 15-Jan-2001 DOA: 08/30/2022     5 DOS: the patient was seen and examined on 09/04/2022    Subjective: Patient seen and examined at bedside this morning Headache is much better Patient continues to be off oxygen Has been reviewed by hematologist who has recommended bridging Lovenox with warfarin Case discussed with vascular surgeon who does not recommend placing an IVC filter in this patient given her age as well as hypercoagulable state Pulmonologist does not plan on bronchoscopy at this time given improvement in patient's condition Denies worsening abdominal pain chest pain or cough   Review of system: Negative except as mentioned above   Brief Narrative:   22 y.o. female with medical history significant of multiple medical issues including type 1 diabetes poorly controlled, hypoalbuminemia, stage III CKD, nephrotic syndrome, tobacco abuse, marijuana abuse presenting with recurrent pulmonary embolus with infarcts.  Patient noted to have been admitted February 20 through 23 for similar issues with noted pulmonary embolus with right heart strain that required thrombectomy by Dr. Lucky Cowboy on February 20 as well as other issues including cavitary lung masses, nephrotic syndrome and minimal-change disease.  Please see discharge summary for further details.  Patient was noted to have been evaluated at [facility] back in January and discharged on Eliquis with concern for significant hypoalbuminemia.  Patient was continued on IV anticoagulation and transition to Eliquis at discharge pending follow-up during most recent hospitalization.  Patient reports worsening shortness of breath as well as chest pain over the past 2 to 3 days.  Worsening pleuritic chest pain. EKG was sinus tachycardia.  CTA of the chest with right middle lobe infarct, bulky bilateral pulmonary embolism noted from prior imaging.  Positive nonocclusive thrombus in the left lower  lobe similar to prior.  RV to LV ratio of 2.3, cavitary lesions in the lateral and posterior right upper lobe similar to prior.  2/29: Seen in consultation by vascular surgery.  No indication at this point for repeat thrombectomy.  Seen in consultation by pulmonary to consider bronchoscopy however patient continued to high risk at this point and no indication for bronc.  Wanting sputum for AFB assessment however as of this time patient is unable to produce sputum.      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 The patient and her father reports compliance with Eliquis Patient states she has not smoked tobacco or marijuana since discharge CT angio chest with noted recurrent bulky bilateral pulmonary embolus and as well as pulmonary infarcts Plan: Case was discussed with CT vacs and does not recommend repeat thrombectomy Blood work shows low Antithrombin III as well as protein S levels but normal protein C levels. Has been reviewed by hematologist who has recommended bridging Lovenox with warfarin Case discussed with vascular surgeon who does not recommend placing an IVC filter in this patient given her age as well as hypercoagulable state Pulmonologist does not plan on bronchoscopy at this time given  improvement in patient's condition Hematologist has recommended bridging with Lovenox as well as warfarin as an outpatient regiment. 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 (HCC) with hyperglycemia Poorly controlled w/ most recent A1C 9.1 08/25/22 Discussed importance of blood sugar controlled  SSI  Lantus added to patient's medication We will continue to monitor blood glucose closely in the setting of current steroid use   Leukocytosis in the setting of steroid use WBC 24 on presentation  Suspect multifactorial in setting of steroid use, pulmonary infarcts as well as cavitary lesions Fairly stable in review of trend from prior admission Will monitor for now Continue treatment regimen Zosyn discontinued according to ID recommendation   Cavitary lesion of lung Cavitary lesions of the lung relatively unchanged from prior admission Plan: nebulizer therapy.   Will continue we will like to obtain sputum for AFB as soon as patient is able to produce.  Infectious disease and pulmonologist on board we appreciate input  Infectious disease does not believe this is infectious in nature   tobacco dependence Patient ports no longer smoking Follow   Marijuana abuse Patient reports no longer smoking Will monitor for now     DVT prophylaxis: Warfarin/Lovenox   Code Status: Full code   Family Communication: Mother and father at bedside 2/29, 3/1/ 3/3   Disposition Plan: Status is: Inpatient   Remains inpatient appropriate because: Cavitary lesions,  unclear etiology.    Multiple consultants involved including pulmonology, infectious disease, nephrology     Level of care: Telemetry Medical     Physical Exam: Vitals reviewed and documented separately General exam: Appears fatigued Respiratory system: No more wheezing with normal work of breathing Cardiovascular system: S1-S2, RRR, no murmurs, trace pedal edema BLE Gastrointestinal system: Soft, NT/ND, normal bowel sounds Central nervous system: Alert and oriented. No focal neurological deficits. Extremities: Symmetric 5 x 5 power. Skin: No rashes, lesions or ulcers Psychiatry: Judgement and insight appear normal. Mood & affect appropriate.      Consultants:  Pulmonary ID Vascular surgery Hematology oncology Nephrologist   Vitals:   09/04/22 0817 09/04/22 0901 09/04/22 0938 09/04/22 1206  BP:   113/65 112/72  Pulse: (!) 112 (!) 105 100 94  Resp: '18  16 18  '$ Temp:   98.4 F (36.9 C) 98.1 F (36.7 C)  TempSrc:   Oral Oral  SpO2: 91%  94% 93%  Weight:      Height:       I spent a total of 55 minutes managing this patient today and discussing goals of care with patient as well as patient's mother as well as hematologist, pulmonologist and vascular surgeon  Author: Verline Lema, MD 09/04/2022 4:46 PM  For on call review [email or web address].

## 2022-09-05 DIAGNOSIS — E1021 Type 1 diabetes mellitus with diabetic nephropathy: Secondary | ICD-10-CM

## 2022-09-05 DIAGNOSIS — I2699 Other pulmonary embolism without acute cor pulmonale: Secondary | ICD-10-CM

## 2022-09-05 HISTORY — DX: Other pulmonary embolism without acute cor pulmonale: I26.99

## 2022-09-05 LAB — CBC WITH DIFFERENTIAL/PLATELET
Abs Immature Granulocytes: 1.2 10*3/uL — ABNORMAL HIGH (ref 0.00–0.07)
Basophils Absolute: 0.1 10*3/uL (ref 0.0–0.1)
Basophils Relative: 0 %
Eosinophils Absolute: 0 10*3/uL (ref 0.0–0.5)
Eosinophils Relative: 0 %
HCT: 30.1 % — ABNORMAL LOW (ref 36.0–46.0)
Hemoglobin: 9.7 g/dL — ABNORMAL LOW (ref 12.0–15.0)
Immature Granulocytes: 4 %
Lymphocytes Relative: 15 %
Lymphs Abs: 4 10*3/uL (ref 0.7–4.0)
MCH: 27.9 pg (ref 26.0–34.0)
MCHC: 32.2 g/dL (ref 30.0–36.0)
MCV: 86.5 fL (ref 80.0–100.0)
Monocytes Absolute: 1.5 10*3/uL — ABNORMAL HIGH (ref 0.1–1.0)
Monocytes Relative: 5 %
Neutro Abs: 20.6 10*3/uL — ABNORMAL HIGH (ref 1.7–7.7)
Neutrophils Relative %: 76 %
Platelets: 107 10*3/uL — ABNORMAL LOW (ref 150–400)
RBC: 3.48 MIL/uL — ABNORMAL LOW (ref 3.87–5.11)
RDW: 14.8 % (ref 11.5–15.5)
Smear Review: NORMAL
WBC: 27.3 10*3/uL — ABNORMAL HIGH (ref 4.0–10.5)
nRBC: 0.1 % (ref 0.0–0.2)

## 2022-09-05 LAB — BASIC METABOLIC PANEL
Anion gap: 4 — ABNORMAL LOW (ref 5–15)
BUN: 37 mg/dL — ABNORMAL HIGH (ref 6–20)
CO2: 20 mmol/L — ABNORMAL LOW (ref 22–32)
Calcium: 7.5 mg/dL — ABNORMAL LOW (ref 8.9–10.3)
Chloride: 106 mmol/L (ref 98–111)
Creatinine, Ser: 1.48 mg/dL — ABNORMAL HIGH (ref 0.44–1.00)
GFR, Estimated: 51 mL/min — ABNORMAL LOW (ref >60)
Glucose, Bld: 261 mg/dL — ABNORMAL HIGH (ref 70–99)
Potassium: 4.4 mmol/L (ref 3.5–5.1)
Sodium: 130 mmol/L — ABNORMAL LOW (ref 135–145)

## 2022-09-05 LAB — GLUCOSE, CAPILLARY
Glucose-Capillary: 237 mg/dL — ABNORMAL HIGH (ref 70–99)
Glucose-Capillary: 164 mg/dL — ABNORMAL HIGH (ref 70–99)

## 2022-09-05 LAB — TACROLIMUS LEVEL: Tacrolimus (FK506) - LabCorp: 4.1 ng/mL (ref 2.0–20.0)

## 2022-09-05 LAB — PROTIME-INR
INR: 1.1 (ref 0.8–1.2)
Prothrombin Time: 14.1 seconds (ref 11.4–15.2)

## 2022-09-05 MED ORDER — BENZONATATE 200 MG PO CAPS: 200.0000 mg | ORAL_CAPSULE | Freq: Three times a day (TID) | ORAL | 0 refills | Status: DC | PRN

## 2022-09-05 MED ORDER — LANTUS 100 UNIT/ML ~~LOC~~ SOLN: 15.0000 [IU] | Freq: Every day | SUBCUTANEOUS | 2 refills | Status: DC

## 2022-09-05 MED ORDER — ENOXAPARIN SODIUM 60 MG/0.6ML IJ SOSY
1.0000 mg/kg | PREFILLED_SYRINGE | Freq: Two times a day (BID) | INTRAMUSCULAR | Status: DC
  Administered 2022-09-05: 57.5 mg via SUBCUTANEOUS
  Filled 2022-09-05: qty 0.6

## 2022-09-05 MED ORDER — ENOXAPARIN SODIUM 60 MG/0.6ML IJ SOSY: 1.0000 mg/kg | PREFILLED_SYRINGE | Freq: Two times a day (BID) | INTRAMUSCULAR | 0 refills | Status: DC

## 2022-09-05 MED ORDER — ACETAMINOPHEN 325 MG PO TABS: 650.0000 mg | ORAL_TABLET | Freq: Four times a day (QID) | ORAL | 0 refills | Status: AC | PRN

## 2022-09-05 MED ORDER — WARFARIN SODIUM 2 MG PO TABS: 2.0000 mg | ORAL_TABLET | Freq: Every day | ORAL | 0 refills | Status: DC

## 2022-09-05 NOTE — Progress Notes (Signed)
[facility]  ROUNDING NOTE   Subjective:   Catherine [name] was admitted to [facility] on 08/30/2022 for Pulmonary embolism ([place]) [I26.99] Hypoglycemia [E16.2] History of nephrotic syndrome [Z87.441] Type 1 diabetes mellitus with other specified complication ([place]) 123XX123 Chest pain, unspecified type [R07.9] Pulmonary embolism, unspecified chronicity, unspecified pulmonary embolism type, unspecified whether acute cor pulmonale present [facility]) [I26.99]  Patient is known to our practice and was seen during previous admissions.   Patient resting quietly Denies pain and discomfort Tolerating meals  Creatinine stable   Objective:  Vital signs in last 24 hours:  Temp:  [97.6 F (36.4 C)-98.4 F (36.9 C)] 98.4 F (36.9 C) (03/05 0907) Pulse Rate:  [89-107] 101 (03/05 0907) Resp:  [16-20] 17 (03/05 0907) BP: (98-110)/(59-73) 108/59 (03/05 0907) SpO2:  [92 %-98 %] 92 % (03/05 0907)  Weight change:  Filed Weights   08/30/22 0439 08/30/22 0559  Weight: 64.5 kg 58.3 kg    Intake/Output: I/O last 3 completed shifts: In: 1162 [P.O.:834; I.V.:328] Out: 1300 [Urine:1300]   Intake/Output this shift:  No intake/output data recorded.  Physical Exam: General: NAD  Head: Normocephalic, atraumatic. Moist oral mucosal membranes, facial edema  Eyes: Anicteric  Lungs:  Clear to auscultation, normal effort  Heart: Regular rate and rhythm  Abdomen:  Soft, nontender  Extremities:  no peripheral edema.  Neurologic: Nonfocal, moving all four extremities  Skin: No lesions        Basic Metabolic Panel: Recent Labs  Lab 08/31/22 0449 09/01/22 0344 09/02/22 0623 09/03/22 0528 09/04/22 0450 09/05/22 0347  NA 134* 129* 132* 133* 135 130*  K 4.2 5.9* 4.4 4.3 4.1 4.4  CL 108 102 107 109 109 106  CO2 23 18* 19* 18* 19* 20*  GLUCOSE 238* 448* 273* 296* 229* 261*  BUN 19 22* 30* 34* 32* 37*  CREATININE 0.99 1.25* 1.42* 1.51* 1.37* 1.48*  CALCIUM 7.2* 7.7* 7.8* 7.6* 7.8*  7.5*  MG 1.6*  --   --   --   --   --   PHOS 3.2  --   --   --   --   --      Liver Function Tests: Recent Labs  Lab 08/31/22 0449  AST 11*  ALT 10  ALKPHOS 97  BILITOT 0.3  PROT 4.3*  ALBUMIN <1.5*    No results for input(s): "LIPASE", "AMYLASE" in the last 168 hours. No results for input(s): "AMMONIA" in the last 168 hours.  CBC: Recent Labs  Lab 09/01/22 0344 09/02/22 0623 09/03/22 0528 09/04/22 0450 09/05/22 0347  WBC 23.8* 26.1* 23.1* 26.5* 27.3*  NEUTROABS  --  19.4* 13.4* 15.8* 20.6*  HGB 9.9* 9.2* 9.0* 9.7* 9.7*  HCT 30.3* 28.4* 27.5* 29.8* 30.1*  MCV 86.8 86.1 86.8 86.6 86.5  PLT 430* 349 231 118* 107*     Cardiac Enzymes: No results for input(s): "CKTOTAL", "CKMB", "CKMBINDEX", "TROPONINI" in the last 168 hours.  BNP: Invalid input(s): "POCBNP"  CBG: Recent Labs  Lab 09/04/22 1203 09/04/22 1636 09/04/22 2032 09/05/22 0909 09/05/22 1223  GLUCAP 154* 152* 231* 237* 164*     Microbiology: Results for orders placed or performed during the hospital encounter of 08/30/22  Culture, blood (Routine X 2) w Reflex to ID Panel     Status: None   Collection Time: 08/30/22  5:13 PM   Specimen: BLOOD  Result Value Ref Range Status   Specimen Description BLOOD BLOOD RIGHT ARM  Final   Special Requests   Final  BOTTLES DRAWN AEROBIC AND ANAEROBIC Blood Culture adequate volume   Culture   Final    NO GROWTH 5 DAYS Performed at [facility], [address] [place], [place] [address]    Report Status 09/04/2022 FINAL  Final  MRSA Next Gen by PCR, Nasal     Status: None   Collection Time: 08/30/22  6:36 PM   Specimen: Nasal Mucosa; Nasal Swab  Result Value Ref Range Status   MRSA by PCR Next Gen NOT DETECTED NOT DETECTED Final    Comment: (NOTE) The GeneXpert MRSA Assay (FDA approved for NASAL specimens only), is one component of a comprehensive MRSA colonization surveillance program. It is not intended to diagnose MRSA infection nor to  guide or monitor treatment for MRSA infections. Test performance is not FDA approved in patients less than 86 years old. Performed at [facility], [address] [place], [place] [address]   Culture, blood (Routine X 2) w Reflex to ID Panel     Status: None   Collection Time: 08/30/22 10:15 PM   Specimen: BLOOD  Result Value Ref Range Status   Specimen Description BLOOD BLOOD RIGHT HAND  Final   Special Requests   Final    BOTTLES DRAWN AEROBIC AND ANAEROBIC Blood Culture adequate volume   Culture   Final    NO GROWTH 5 DAYS Performed at [facility], [address], [place] [address]    Report Status 09/04/2022 FINAL  Final  Respiratory (~20 pathogens) panel by PCR     Status: None   Collection Time: 08/31/22  5:42 PM   Specimen: Nasopharyngeal Swab; Respiratory  Result Value Ref Range Status   Adenovirus NOT DETECTED NOT DETECTED Final   Coronavirus 229E NOT DETECTED NOT DETECTED Final    Comment: (NOTE) The Coronavirus on the Respiratory Panel, DOES NOT test for the novel  Coronavirus (2019 nCoV)    Coronavirus HKU1 NOT DETECTED NOT DETECTED Final   Coronavirus NL63 NOT DETECTED NOT DETECTED Final   Coronavirus OC43 NOT DETECTED NOT DETECTED Final   Metapneumovirus NOT DETECTED NOT DETECTED Final   Rhinovirus / Enterovirus NOT DETECTED NOT DETECTED Final   Influenza A NOT DETECTED NOT DETECTED Final   Influenza B NOT DETECTED NOT DETECTED Final   Parainfluenza Virus 1 NOT DETECTED NOT DETECTED Final   Parainfluenza Virus 2 NOT DETECTED NOT DETECTED Final   Parainfluenza Virus 3 NOT DETECTED NOT DETECTED Final   Parainfluenza Virus 4 NOT DETECTED NOT DETECTED Final   Respiratory Syncytial Virus NOT DETECTED NOT DETECTED Final   Bordetella pertussis NOT DETECTED NOT DETECTED Final   Bordetella Parapertussis NOT DETECTED NOT DETECTED Final   Chlamydophila pneumoniae NOT DETECTED NOT DETECTED Final   Mycoplasma pneumoniae NOT DETECTED NOT  DETECTED Final    Comment: Performed at [facility], [place] [address], [place], [place] [address]  Respiratory (~20 pathogens) panel by PCR     Status: None   Collection Time: 08/31/22 11:20 PM   Specimen: Nasopharyngeal Swab; Respiratory  Result Value Ref Range Status   Adenovirus NOT DETECTED NOT DETECTED Final   Coronavirus 229E NOT DETECTED NOT DETECTED Final    Comment: (NOTE) The Coronavirus on the Respiratory Panel, DOES NOT test for the novel  Coronavirus (2019 nCoV)    Coronavirus HKU1 NOT DETECTED NOT DETECTED Final   Coronavirus NL63 NOT DETECTED NOT DETECTED Final   Coronavirus OC43 NOT DETECTED NOT DETECTED Final   Metapneumovirus NOT DETECTED NOT DETECTED Final  Rhinovirus / Enterovirus NOT DETECTED NOT DETECTED Final   Influenza A NOT DETECTED NOT DETECTED Final   Influenza B NOT DETECTED NOT DETECTED Final   Parainfluenza Virus 1 NOT DETECTED NOT DETECTED Final   Parainfluenza Virus 2 NOT DETECTED NOT DETECTED Final   Parainfluenza Virus 3 NOT DETECTED NOT DETECTED Final   Parainfluenza Virus 4 NOT DETECTED NOT DETECTED Final   Respiratory Syncytial Virus NOT DETECTED NOT DETECTED Final   Bordetella pertussis NOT DETECTED NOT DETECTED Final   Bordetella Parapertussis NOT DETECTED NOT DETECTED Final   Chlamydophila pneumoniae NOT DETECTED NOT DETECTED Final   Mycoplasma pneumoniae NOT DETECTED NOT DETECTED Final    Comment: Performed at [facility], [place] [address], [place], [place] [address]    Coagulation Studies: Recent Labs    09/05/22 0347  LABPROT 14.1  INR 1.1     Urinalysis: No results for input(s): "COLORURINE", "LABSPEC", "PHURINE", "GLUCOSEU", "HGBUR", "BILIRUBINUR", "KETONESUR", "PROTEINUR", "UROBILINOGEN", "NITRITE", "LEUKOCYTESUR" in the last 72 hours.  Invalid input(s): "APPERANCEUR"     Imaging: No results found.   Medications:       budesonide (PULMICORT) nebulizer solution  0.5 mg Nebulization BID   enoxaparin  (LOVENOX) injection  1 mg/kg Subcutaneous Q12H   feeding supplement  237 mL Oral BID BM   feeding supplement (GLUCERNA SHAKE)  237 mL Oral BID BM   insulin aspart  0-5 Units Subcutaneous QHS   insulin aspart  0-9 Units Subcutaneous TID WC   insulin aspart  3 Units Subcutaneous TID WC   insulin glargine-yfgn  15 Units Subcutaneous BID   ipratropium-albuterol  3 mL Nebulization BID   predniSONE  50 mg Oral Q breakfast   sulfamethoxazole-trimethoprim  1 tablet Oral Once per day on Mon Wed Fri   tacrolimus  2 mg Oral BID   torsemide  20 mg Oral Daily   warfarin  2 mg Oral Daily   Warfarin - Physician Dosing Inpatient   Does not apply q1600   acetaminophen, benzonatate, chlorpheniramine-HYDROcodone, HYDROmorphone (DILAUDID) injection, oxyCODONE, sodium chloride  Assessment/ Plan:  Catherine [name] is a 22 y.o. black female with minimal change disease on tacrolimus, diabetes mellitus type I, hypertension, hyperlipidemia, who was admitted to [facility] on 08/30/2022 for Pulmonary embolism ([place]) [I26.99] Hypoglycemia [E16.2] History of nephrotic syndrome [Z87.441] Type 1 diabetes mellitus with other specified complication ([place]) 123XX123 Chest pain, unspecified type [R07.9] Pulmonary embolism, unspecified chronicity, unspecified pulmonary embolism type, unspecified whether acute cor pulmonale present ([place]) [I26.99]  Chronic kidney disease stage IIIA with minimal change disease and nephrotic syndrome. Most recent urine with 5.6 grams of proteinuria on 07/13/2022. With hypoalbuminemia Continue Prednisone and Tacrolimus Tacrolimus level pending Protein creatinine ratio 22.36 Continue TMP/SMX and Torsemide '20mg'$  daily  Acute pulmonary embolism: with hypoalbuminemia and nephrotic syndrome. Was discharged on apixaban.   Heparin drip weaned off IVC filter evaluation outpatient  Currently on Coumadin/Lovenox bridge  Hypotension: Chronic condition for this patient.  Blood pressure 108/59  Secondary  Hyperparathyroidism: not currently on an activated vitamin D.  Calcium remains decreased, 7.5. Patient encouraged to increase oral intake  5. Hyperkalemia,  should correct with Torsemide.   Potassium stable   LOS: 6   3/5/202412:38 PM

## 2022-09-05 NOTE — Discharge Summary (Signed)
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 female with medical history significant of multiple medical issues including type 1 diabetes poorly controlled, hypoalbuminemia, stage III CKD, nephrotic syndrome, tobacco abuse, marijuana abuse presenting with recurrent pulmonary embolus with infarcts.  Patient noted to have been admitted February 20 through 23 for similar issues with noted pulmonary embolus with right heart strain that required thrombectomy by Dr. Lucky Cowboy on February 20 as well as other issues including cavitary lung masses, nephrotic syndrome and minimal-change disease.  Patient was noted to have been evaluated at [facility] back in January and discharged on Eliquis with concern for significant hypoalbuminemia.  Patient was continued on IV anticoagulation and transition to Eliquis at discharge pending follow-up during most recent hospitalization.  Patient reports worsening shortness of breath as well as chest pain over the past 2 to 3 days.  Worsening pleuritic chest pain. EKG was sinus tachycardia.  CTA of the chest with right middle lobe infarct, bulky bilateral pulmonary embolism noted from prior imaging.  Positive nonocclusive thrombus in the left lower lobe similar to prior.  RV to LV ratio of 2.3, cavitary lesions in the lateral and posterior right upper lobe similar to prior.  2/29: Seen in  consultation by vascular surgery.  No indication at this point for repeat thrombectomy.   Case was discussed with CT vacs and does not recommend repeat thrombectomy Blood work shows low Antithrombin III as well as protein S levels but normal protein C levels. Has been reviewed by hematologist who has recommended bridging Lovenox with warfarin due to failure of Eliquis and does not recommend another NOAC. Case discussed with vascular surgeon who does not recommend placing an IVC filter in this patient given her age as well as hypercoagulable state Pulmonologist does not plan on bronchoscopy at this time given improvement in patient's condition.  Given persistent leukocytosis factious disease was consulted and given that there is no infectious etiology leukocytosis thought to be due to pulmonary infarct/pulmonary embolism as well as steroid effect.  Patient has been cleared by infectious disease for discharge and will only follow-up with them as an outpatient if patient has concerns of fever. Hematologist has recommended bridging with Lovenox as well as warfarin as an outpatient regiment and patient will follow-up with hematologist for INR checks until primary care physician is established.   Consultants: Hematology, pulmonology, infectious disease, nephrology  Disposition: Home Diet recommendation:  Discharge Diet Orders (From admission, onward)     Start     Ordered   09/05/22 0000  Diet - low sodium heart healthy        09/05/22 1543           Regular diet DISCHARGE MEDICATION: Allergies as of 09/05/2022   No Known Allergies      Medication List     STOP taking these medications    amoxicillin-clavulanate 875-125 MG tablet Commonly known as: AUGMENTIN   apixaban 5 MG  Tabs tablet Commonly known as: ELIQUIS   Apixaban Starter Pack ('10mg'$  and '5mg'$ ) Commonly known as: ELIQUIS STARTER PACK   atorvastatin 40 MG tablet Commonly known as: LIPITOR   nicotine 14 mg/24hr  patch Commonly known as: NICODERM CQ - dosed in mg/24 hours       TAKE these medications    acetaminophen 325 MG tablet Commonly known as: TYLENOL Take 2 tablets (650 mg total) by mouth every 6 (six) hours as needed for mild pain or headache.   benzonatate 100 MG capsule Commonly known as: Tessalon Perles Take 1 capsule (100 mg total) by mouth 3 (three) times daily as needed for cough.   benzonatate 200 MG capsule Commonly known as: TESSALON Take 1 capsule (200 mg total) by mouth 3 (three) times daily as needed for cough.   blood glucose meter kit and supplies Dispense based on patient and insurance preference. Use up to four times daily as directed. (FOR ICD-10 E10.9, E11.9).   chlorpheniramine-HYDROcodone 10-8 MG/5ML Commonly known as: TUSSIONEX Take 5 mLs by mouth every 12 (twelve) hours as needed for cough.   enoxaparin 60 MG/0.6ML injection Commonly known as: LOVENOX Inject 0.575 mLs (57.5 mg total) into the skin every 12 (twelve) hours.   feeding supplement (GLUCERNA SHAKE) Liqd Take 237 mLs by mouth 2 (two) times daily between meals.   glucagon 1 MG injection Inject 1 mg into the muscle as needed.   insulin aspart 100 UNIT/ML injection Commonly known as: novoLOG Inject 5-8 Units into the skin 3 (three) times daily before meals.   Lantus 100 UNIT/ML injection Generic drug: insulin glargine Inject 0.15 mLs (15 Units total) into the skin at bedtime. What changed: how much to take   multivitamin with minerals Tabs tablet Take 1 tablet by mouth daily.   predniSONE 50 MG tablet Commonly known as: DELTASONE Take 1 tablet (50 mg total) by mouth daily with breakfast.   sulfamethoxazole-trimethoprim 800-160 MG tablet Commonly known as: BACTRIM DS Take 1 tablet by mouth every Monday, Wednesday, and Friday.   tacrolimus 1 MG capsule Commonly known as: PROGRAF Take 2 capsules (2 mg total) by mouth 2 (two) times daily.   torsemide 20 MG tablet Commonly known as:  DEMADEX Take 1 tablet (20 mg total) by mouth daily.   Vitamin D (Ergocalciferol) 1.25 MG (50000 UNIT) Caps capsule Commonly known as: DRISDOL Take 50,000 Units by mouth every 7 (seven) days.   warfarin 2 MG tablet Commonly known as: COUMADIN Take 1 tablet (2 mg total) by mouth daily.        Follow-up Information     Flora Lipps, MD. Schedule an appointment as soon as possible for a visit.   Specialties: Pulmonary Disease, Cardiology Contact information: [address] [place] [id number] [phone]         Earlie Server, MD .   Specialty: Oncology Contact information: [place] [address] [phone]         Lavonia Dana, MD .   Specialty: Nephrology Contact information: [address] [place] [address] [phone]                Discharge Exam: General exam: Appears fatigued Respiratory system: No more wheezing with normal work of breathing Cardiovascular system: S1-S2, RRR, no murmurs, trace pedal edema BLE Gastrointestinal system: Soft, NT/ND, normal bowel sounds Central nervous system: Alert and oriented. No focal neurological deficits. Extremities: Symmetric 5 x 5 power. Skin: No rashes, lesions or  ulcers Psychiatry: Judgement and insight appear normal. Mood & affect appropriate.   Filed Weights   08/30/22 0439 08/30/22 0559  Weight: 64.5 kg 58.3 kg    Condition at discharge: good Discharge time spent: greater than 30 minutes.  Signed: Verline Lema, MD [facility] 09/05/2022

## 2022-09-05 NOTE — Progress Notes (Signed)
Date of Admission:  08/30/2022      ID: Catherine [name] is a 22 y.o. 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   HTN (hypertension)   Type 1 diabetes mellitus with other specified complication (HCC)   Marijuana abuse   Tobacco dependence   Cavitary lesion of lung   Leukocytosis   Acute hypoxic respiratory failure (HCC)   History of nephrotic syndrome   Hypoglycemia    Subjective: Pt feeling much better No sob Chest pain resolved Mom at bedside  Medications:   budesonide (PULMICORT) nebulizer solution  0.5 mg Nebulization BID   enoxaparin (LOVENOX) injection  1 mg/kg Subcutaneous Q12H   feeding supplement  237 mL Oral BID BM   feeding supplement (GLUCERNA SHAKE)  237 mL Oral BID BM   insulin aspart  0-5 Units Subcutaneous QHS   insulin aspart  0-9 Units Subcutaneous TID WC   insulin aspart  3 Units Subcutaneous TID WC   insulin glargine-yfgn  15 Units Subcutaneous BID   ipratropium-albuterol  3 mL Nebulization BID   predniSONE  50 mg Oral Q breakfast   sulfamethoxazole-trimethoprim  1 tablet Oral Once per day on Mon Wed Fri   tacrolimus  2 mg Oral BID   torsemide  20 mg Oral Daily   warfarin  2 mg Oral Daily   Warfarin - Physician Dosing Inpatient   Does not apply q1600    Objective: Vital signs in last 24 hours: Temp:  [97.6 F (36.4 C)-98.4 F (36.9 C)] 98.4 F (36.9 C) (03/05 0907) Pulse Rate:  [89-107] 101 (03/05 0907) Resp:  [17-20] 17 (03/05 0907) BP: (98-110)/(59-73) 108/59 (03/05 0907) SpO2:  [92 %-98 %] 92 % (03/05 0907)    PHYSICAL EXAM:  General: awake cooperative, no distress,  Lungs: b/la ir entry Crepts bases rt > left Heart: s1s2. Abdomen: Soft, non-tender,not distended. Bowel sounds normal. No masses Extremities: edema legs Skin: No rashes or lesions. Or bruising Lymph: Cervical, supraclavicular  normal. Neurologic: Grossly non-focal  Lab Results Recent Labs    09/04/22 0450 09/05/22 0347  WBC 26.5* 27.3*  HGB 9.7* 9.7*  HCT 29.8* 30.1*  NA 135 130*  K 4.1 4.4  CL 109 106  CO2 19* 20*  BUN 32* 37*  CREATININE 1.37* 1.48*   Liver Panel No results for input(s): "PROT", "ALBUMIN", "AST", "ALT", "ALKPHOS", "BILITOT", "BILIDIR", "IBILI" in the last 72 hours.  Microbiology: [facility]    Assessment/Plan: ?22 y.o. 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 Because of her immune compromised state concern for opportunistic infections like Nocardia, PJP Beta D glucan borderline elevated and not significant-  repeated MRSA nares neg I think all her pulmonary changes of nodular cavitary lesions  are due to PE and not infection- she will have to follow up with pulmonary as OP for monitoring    ? Nephrotic syndrome with proteinuria puts her at risk for thrombosis- she failed eliquis- seen by heme - now on  Minimal change disease causing nephrotic syndrome- she has been non compliant with tacrolimus- now on high dose steroids with PJP prophylaxis?   DM-  On insulin ?  Pt is being discharged today She needs to follow up as OP with nephrology, pulmonary and Heme/PCP ID follow up only if needed other specialties Mom aware Discussed the management with  patient and  her mother and care team in detail.

## 2022-09-05 NOTE — TOC Transition Note (Signed)
Transition of Care [facility]) - CM/SW Discharge Note   Patient Details  Name: Catherine [name] MRN: [id number] Date of Birth: 2001/02/27  Transition of Care [facility]) CM/SW Contact:  Gerilyn Pilgrim, LCSW Phone Number: 09/05/2022, 3:04 PM   Clinical Narrative:  Pt discharging today. No TOC needs identified. CSW signing off.       Barriers to Discharge: Continued Medical Work up   Patient Goals and CMS Choice      Discharge Placement                         Discharge Plan and Services Additional resources added to the After Visit Summary for       Post Acute Care Choice: NA                               Social Determinants of Health (SDOH) Interventions SDOH Screenings   Food Insecurity: No Food Insecurity (08/30/2022)  Housing: Low Risk  (08/30/2022)  Transportation Needs: No Transportation Needs (08/30/2022)  Utilities: Not At Risk (08/30/2022)  Tobacco Use: High Risk (08/30/2022)     Readmission Risk Interventions    08/31/2022   11:37 AM 08/25/2022    9:46 AM 09/27/2021    4:09 PM  Readmission Risk Prevention Plan  Transportation Screening Complete Complete Complete  PCP or Specialist Appt within 3-5 Days   Complete  HRI or [place]   Complete  Social Work Consult for [place] Planning/Counseling   Complete  Palliative Care Screening   Not Applicable  Medication Review [employer]) Complete Complete Referral to Pharmacy  PCP or Specialist appointment within 3-5 days of discharge Complete Complete   HRI or [place]  Complete   SW Recovery Care/Counseling Consult Complete Complete   Palliative Care Screening Not Applicable Not [place] Not Applicable Not Applicable

## 2022-09-06 ENCOUNTER — Telehealth: Payer: Self-pay

## 2022-09-06 DIAGNOSIS — I2609 Other pulmonary embolism with acute cor pulmonale: Secondary | ICD-10-CM

## 2022-09-06 NOTE — Telephone Encounter (Signed)
Dr. Tasia Catchings saw patient during admission. Please schedule patient for lab/MD on 3/8, per MD request. Please inform pt of appt.

## 2022-09-06 NOTE — Telephone Encounter (Signed)
-----   Message from Earlie Server, MD sent at 09/05/2022 10:27 PM EST ----- I saw this patient during admission. Please arrange her to do a follow up on 3/8 with lab MD check INR stat, and cbc, iron tibc ferritin

## 2022-09-08 ENCOUNTER — Encounter: Payer: Self-pay | Admitting: Oncology

## 2022-09-08 ENCOUNTER — Inpatient Hospital Stay: Payer: Medicaid Other | Attending: Oncology

## 2022-09-08 ENCOUNTER — Inpatient Hospital Stay (HOSPITAL_BASED_OUTPATIENT_CLINIC_OR_DEPARTMENT_OTHER): Payer: Medicaid Other | Admitting: Oncology

## 2022-09-08 VITALS — BP 108/75 | HR 102 | Temp 98.4°F | Resp 18 | Wt 116.9 lb

## 2022-09-08 DIAGNOSIS — I2609 Other pulmonary embolism with acute cor pulmonale: Secondary | ICD-10-CM

## 2022-09-08 DIAGNOSIS — I2699 Other pulmonary embolism without acute cor pulmonale: Secondary | ICD-10-CM | POA: Insufficient documentation

## 2022-09-08 DIAGNOSIS — N049 Nephrotic syndrome with unspecified morphologic changes: Secondary | ICD-10-CM

## 2022-09-08 LAB — CBC WITH DIFFERENTIAL (CANCER CENTER ONLY)
Abs Immature Granulocytes: 0.44 10*3/uL — ABNORMAL HIGH (ref 0.00–0.07)
Basophils Absolute: 0.1 10*3/uL (ref 0.0–0.1)
Basophils Relative: 1 %
Eosinophils Absolute: 0.3 10*3/uL (ref 0.0–0.5)
Eosinophils Relative: 1 %
HCT: 38.8 % (ref 36.0–46.0)
Hemoglobin: 12.6 g/dL (ref 12.0–15.0)
Immature Granulocytes: 2 %
Lymphocytes Relative: 40 %
Lymphs Abs: 9.6 10*3/uL — ABNORMAL HIGH (ref 0.7–4.0)
MCH: 28.5 pg (ref 26.0–34.0)
MCHC: 32.5 g/dL (ref 30.0–36.0)
MCV: 87.8 fL (ref 80.0–100.0)
Monocytes Absolute: 1.3 10*3/uL — ABNORMAL HIGH (ref 0.1–1.0)
Monocytes Relative: 5 %
Neutro Abs: 12.1 10*3/uL — ABNORMAL HIGH (ref 1.7–7.7)
Neutrophils Relative %: 51 %
Platelet Count: 221 10*3/uL (ref 150–400)
RBC: 4.42 MIL/uL (ref 3.87–5.11)
RDW: 15.5 % (ref 11.5–15.5)
WBC Count: 23.9 10*3/uL — ABNORMAL HIGH (ref 4.0–10.5)
nRBC: 0.1 % (ref 0.0–0.2)

## 2022-09-08 LAB — IRON AND TIBC
Iron: 43 ug/dL (ref 28–170)
Saturation Ratios: 28 % (ref 10.4–31.8)
TIBC: 153 ug/dL — ABNORMAL LOW (ref 250–450)
UIBC: 110 ug/dL

## 2022-09-08 LAB — PROTHROMBIN GENE MUTATION

## 2022-09-08 LAB — PROTIME-INR
INR: 1.2 (ref 0.8–1.2)
Prothrombin Time: 14.7 seconds (ref 11.4–15.2)

## 2022-09-08 LAB — FERRITIN: Ferritin: 143 ng/mL (ref 11–307)

## 2022-09-08 NOTE — Assessment & Plan Note (Signed)
secondary to the hypercoagulability secondary to nephrotic syndrome/hypoalbuminemia She received mechanical embolectomy during last admission and had a recurrent pulmonary embolism despite taking Eliquis.  This is considered as Eliquis failure.  DOACs have limited data for effectiveness in nephrotic syndrome. Decreased Antithrombin III level and decreased protein S level probably is due to acute consumption due to active thrombosis, as well as nephrotic syndrome.  No family history of blood clots. continue Lovenox 1 mg/kg twice daily, recommend patient to increase Coumadin to '4mg'$  daily Repeat INR on 3/11, and 3/14, once INR is therapeutic, will discontinue Lovenox.

## 2022-09-08 NOTE — Progress Notes (Addendum)
Hematology/Oncology Consult note [phone] [phone]        REFERRING PROVIDER: [facility]   CHIEF COMPLAINTS/REASON FOR VISIT:  Follow up for bilateral pulmonary embolism, due to nephrotic syndrome.    ASSESSMENT & PLAN:   Pulmonary embolism (HCC) secondary to the hypercoagulability secondary to nephrotic syndrome/hypoalbuminemia She received mechanical embolectomy during last admission and had a recurrent pulmonary embolism despite taking Eliquis.  This is considered as Eliquis failure.  DOACs have limited data for effectiveness in nephrotic syndrome. Decreased Antithrombin III level and decreased protein S level probably is due to acute consumption due to active thrombosis, as well as nephrotic syndrome.  No family history of blood clots. continue Lovenox 1 mg/kg twice daily, recommend patient to increase Coumadin to 4mg  daily Repeat INR on 3/11, and 3/14, once INR is therapeutic, will discontinue Lovenox.   Nephrotic syndrome On Tacrolimus, and prednisone. Continue follow up with nephrology   Orders Placed This Encounter  Procedures   Protime-INR    Standing Status:   Future    Standing Expiration Date:   09/08/2023   Protime-INR    Standing Status:   Future    Standing Expiration Date:   09/08/2023   Follow up TBD All questions were answered. The patient knows to call the clinic with any problems, questions or concerns.  Rickard Patience, MD, PhD [facility] Hematology Oncology 09/08/2022   HISTORY OF PRESENTING ILLNESS:   Catherine [name] is a  22 y.o.  female presents for follow up of bilateral pulmonary embolism 06/30/2022 - 07/05/2022 patient was admitted at [facility] due to AKI on CKD, proteinuria, hypoalbuminemia Ultrasound of bilateral lower extremity was negative for DVT. Patient was started on Eliquis 5 mg twice daily for prophylactic anticoagulation due to the concern of hypercoagulability secondary to hypoalbuminemia/nephrotic  syndrome.  Patient took 30 days of Eliquis and ran out after that.   08/22/2022 - 08/25/2022, patient was hospitalized at [facility] due to shortness of breath, 08/22/2021, CT chest PE: Showed acute submassive PE with right heart strain.  Multifocal bilateral upper lobe nodules-favor infectious process.  Multifocal groundglass opacities with subpleural sparing likely reflects combination of pulmonary hemorrhage and/for any infectious or inflammatory etiology. Patient was treated with heparin drip, transition to Eliquis. She underwent thrombolysis and mechanical embolectomy by Dr. Wyn Quaker on 08/22/2022.   08/30/2022, patient returned to the emergency room due to shortness of breath. Repeat CT chest angiogram showed interval development of confluent patchy and posterior consolidation airspace disease in the right middle lobe suggesting infarct. Bulky bilateral pulmonary embolism again noted, occlusive in right middle lobe lobar branch and segmental branches to the right lower lobe, similar to prior. Medial segmental and subsegmental branches to the right lower lobe are patent on the current study despite being occluded previously. Small volume occlusive thrombus in a subsegmental right upper lobe branches similar to prior.  No change in the nonocclusive thrombus in the segmental branches to the left lower lobe.  Enlargement of pulmonary outflow tract/main pulmonary arteries.  Cavitary lesions in the lateral and posterior right upper lobe.  Nodular and patchy areas of consolidative airspace disease Patient was restarted on heparin drip and was transitioned to Lovenox 1mg /kg BID bridging to coumadin with INR goal 2-3  Cavitary lesion, Patient was seen by infectious disease physician for pulmonary infiltrates.  Pulmonologist does not recommend bronchoscopy due to high risk.   INTERVAL HISTORY Catherine [name] is a 22 y.o. female who has above history reviewed by me today presents  for follow up visit for recurrent  bilateral pulmonary embolism.   Today she was accompanied by father.  She is on Lovenox 1mg /kg BID, as well as coumadin 2mg  daily.  She tolerates well. Denies sob, chest pain, bleeding events.    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   Nephrotic syndrome 08/23/2019   Tobacco dependence     SURGICAL HISTORY: Past Surgical History:  Procedure Laterality Date   PULMONARY THROMBECTOMY Bilateral 08/22/2022   Procedure: PULMONARY THROMBECTOMY;  Surgeon: Annice Needy, MD;  Location: [facility] INVASIVE CV LAB;  Service: Cardiovascular;  Laterality: Bilateral;    SOCIAL HISTORY: 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  Tobacco Use   Smoking status: Former    Types: E-cigarettes    Quit date: 07/2022    Years since quitting: 0.1   Smokeless tobacco: Never   Tobacco comments:    Quit substance use since the beginning of the year   Vaping Use   Vaping Use: Former  Substance and Sexual Activity   Alcohol use: Not Currently   Drug use: Yes    Types: Marijuana    Comment: Quit using marijuana approx earlier this year   Sexual activity: Not Currently  Other Topics Concern   Not on file  Social History Narrative   Not on file   Social Determinants of Health   Financial Resource Strain: Not on file  Food Insecurity: No Food Insecurity (09/08/2022)   Hunger Vital Sign    Worried About Running Out of Food in the Last Year: Never true    Ran Out of Food in the Last Year: Never true  Transportation Needs: No Transportation Needs (09/08/2022)   PRAPARE - Administrator, Civil Service (Medical): No    Lack of  Transportation (Non-Medical): No  Physical Activity: Not on file  Stress: Not on file  Social Connections: Not on file  Intimate Partner Violence: Not At Risk (09/08/2022)   Humiliation, Afraid, Rape, and Kick questionnaire    Fear of Current or Ex-Partner: No    Emotionally Abused: No    Physically Abused: No    Sexually Abused: No    FAMILY HISTORY: History reviewed. No pertinent family history.  ALLERGIES:  has No Known Allergies.  MEDICATIONS:  Current Outpatient Medications  Medication Sig Dispense Refill   acetaminophen (TYLENOL) 325 MG tablet Take 2 tablets (650 mg total) by mouth every 6 (six) hours as needed for mild pain or headache. 20 tablet 0   benzonatate (TESSALON PERLES) 100 MG capsule Take 1 capsule (100 mg total) by mouth 3 (three) times daily as needed for cough. 30 capsule 1   chlorpheniramine-HYDROcodone (TUSSIONEX) 10-8 MG/5ML Take 5 mLs by mouth every 12 (twelve) hours as needed for cough. 115 mL 0   enoxaparin (LOVENOX) 60 MG/0.6ML injection Inject 0.575 mLs (57.5 mg total) into the skin every 12 (twelve) hours. 14 mL 0   feeding supplement, GLUCERNA SHAKE, (GLUCERNA SHAKE) LIQD Take 237 mLs by mouth 2 (two) times daily between meals.  0   insulin aspart (NOVOLOG) 100  UNIT/ML injection Inject 5-8 Units into the skin 3 (three) times daily before meals. 7.2 mL 2   LANTUS 100 UNIT/ML injection Inject 0.15 mLs (15 Units total) into the skin at bedtime. 4.5 mL 2   Multiple Vitamin (MULTIVITAMIN WITH MINERALS) TABS tablet Take 1 tablet by mouth daily.     predniSONE (DELTASONE) 50 MG tablet Take 1 tablet (50 mg total) by mouth daily with breakfast. 30 tablet 0   tacrolimus (PROGRAF) 1 MG capsule Take 2 capsules (2 mg total) by mouth 2 (two) times daily. 120 capsule 2   torsemide (DEMADEX) 20 MG tablet Take 1 tablet (20 mg total) by mouth daily. 30 tablet 0   Vitamin D, Ergocalciferol, (DRISDOL) 1.25 MG (50000 UNIT) CAPS capsule Take 50,000 Units by mouth every 7 (seven)  days.     warfarin (COUMADIN) 2 MG tablet Take 1 tablet (2 mg total) by mouth daily. 30 tablet 0   benzonatate (TESSALON) 200 MG capsule Take 1 capsule (200 mg total) by mouth 3 (three) times daily as needed for cough. (Patient not taking: Reported on 09/08/2022) 20 capsule 0   blood glucose meter kit and supplies Dispense based on patient and insurance preference. Use up to four times daily as directed. (FOR ICD-10 E10.9, E11.9). (Patient not taking: Reported on 09/08/2022) 1 each 0   glucagon 1 MG injection Inject 1 mg into the muscle as needed. (Patient not taking: Reported on 09/08/2022)     sulfamethoxazole-trimethoprim (BACTRIM DS) 800-160 MG tablet Take 1 tablet by mouth every Monday, Wednesday, and Friday. (Patient not taking: Reported on 09/08/2022) 12 tablet 2   No current facility-administered medications for this visit.    Review of Systems  Constitutional:  Negative for appetite change, chills, fatigue and fever.  HENT:   Negative for hearing loss and voice change.   Eyes:  Negative for eye problems.  Respiratory:  Negative for chest tightness and cough.   Cardiovascular:  Negative for chest pain.  Gastrointestinal:  Negative for abdominal distention, abdominal pain and blood in stool.  Endocrine: Negative for hot flashes.  Genitourinary:  Negative for difficulty urinating and frequency.   Musculoskeletal:  Negative for arthralgias.  Skin:  Negative for itching and rash.  Neurological:  Negative for extremity weakness.  Hematological:  Negative for adenopathy.  Psychiatric/Behavioral:  Negative for confusion.    PHYSICAL EXAMINATION:  Vitals:   09/08/22 1111  BP: 108/75  Pulse: (!) 102  Resp: 18  Temp: 98.4 F (36.9 C)   Filed Weights   09/08/22 1111  Weight: 116 lb 14.4 oz (53 kg)    Physical Exam  LABORATORY DATA:  I have reviewed the data as listed    Latest Ref Rng & Units 09/08/2022   10:47 AM 09/05/2022    3:47 AM 09/04/2022    4:50 AM  CBC  WBC 4.0 - 10.5 K/uL  23.9  27.3  26.5   Hemoglobin 12.0 - 15.0 g/dL 16.1  9.7  9.7   Hematocrit 36.0 - 46.0 % 38.8  30.1  29.8   Platelets 150 - 400 K/uL 221  107  118       Latest Ref Rng & Units 09/05/2022    3:47 AM 09/04/2022    4:50 AM 09/03/2022    5:28 AM  CMP  Glucose 70 - 99 mg/dL 096  045  409   BUN 6 - 20 mg/dL 37  32  34   Creatinine 0.44 - 1.00 mg/dL 8.11  9.14  7.82  Sodium 135 - 145 mmol/L 130  135  133   Potassium 3.5 - 5.1 mmol/L 4.4  4.1  4.3   Chloride 98 - 111 mmol/L 106  109  109   CO2 22 - 32 mmol/L 20  19  18    Calcium 8.9 - 10.3 mg/dL 7.5  7.8  7.6       RADIOGRAPHIC STUDIES: I have personally reviewed the radiological images as listed and agreed with the findings in the report. CT Angio Chest PE W/Cm &/Or Wo Cm  Result Date: 08/30/2022 CLINICAL DATA:  Status post thrombectomy. History of pulmonary embolus. EXAM: CT ANGIOGRAPHY CHEST WITH CONTRAST TECHNIQUE: Multidetector CT imaging of the chest was performed using the standard protocol during bolus administration of intravenous contrast. Multiplanar CT image reconstructions and MIPs were obtained to evaluate the vascular anatomy. RADIATION DOSE REDUCTION: This exam was performed according to the departmental dose-optimization program which includes automated exposure control, adjustment of the mA and/or kV according to patient size and/or use of iterative reconstruction technique. CONTRAST:  48mL OMNIPAQUE IOHEXOL 350 MG/ML SOLN COMPARISON:  Chest x-ray earlier same day.  Chest CT 02/02/2023 FINDINGS: Cardiovascular: Heart size upper normal with trace pericardial effusion, similar to prior. No thoracic aortic aneurysm. Enlargement of the pulmonary outflow tract/main pulmonary arteries suggests pulmonary arterial hypertension. Bulky pulmonary embolism again noted bilaterally, occlusive in right middle lobe lobar branch and segmental branches to the right lower lobe. Medial segmental and subsegmental branches to the right lower lobe are  patent on the current study despite being occluded previously. Small volume occlusive thrombus in a subsegmental right upper lobe branches similar to prior. No substantial change in appearance of nonocclusive thrombus in segmental branches to the left lower lobe with similar occlusion of some subsegmental left lower lobe pulmonary arteries. RV LV ratio is 2.3 today compared to 1.9 previously. Mediastinum/Nodes: No mediastinal lymphadenopathy. Consolidative airspace disease is seen in the right hilum. The esophagus has normal imaging features. There is no axillary lymphadenopathy. Lungs/Pleura: Similar patchy ground-glass opacity in both upper lobes. Interval development of confluent patchy and posterior consolidative airspace disease in the right middle lobe suggesting infarct. Cavitary lesions in the lateral and posterior right upper lobe are similar to prior. Nodular and patchy areas of consolidative airspace disease in the lower lungs again noted, stable to minimally progressive in the interval. No substantial pleural effusion. Upper Abdomen: Visualized portion of the upper abdomen is unremarkable. Musculoskeletal: No worrisome lytic or sclerotic osseous abnormality. Review of the MIP images confirms the above findings. IMPRESSION: 1. Interval development of confluent patchy and posterior consolidative airspace disease in the right middle lobe suggesting infarct. 2. Bulky bilateral pulmonary embolism again noted, occlusive in right middle lobe lobar branch and segmental branches to the right lower lobe, similar to prior. Medial segmental and subsegmental branches to the right lower lobe are patent on the current study despite being occluded previously. Small volume occlusive thrombus in a subsegmental right upper lobe branches similar to prior. 3. No substantial change in appearance of nonocclusive thrombus in segmental branches to the left lower lobe with similar occlusion of some subsegmental left lower lobe  pulmonary arteries. 4. Enlargement of the pulmonary outflow tract/main pulmonary arteries suggests pulmonary arterial hypertension. RV/LV ratio is 2.3 today compared to 1.9 previously. 5. Cavitary lesions in the lateral and posterior right upper lobe are similar to prior. 6. Nodular and patchy areas of consolidative airspace disease in the lower lungs again noted, stable to minimally progressive in the interval.  Critical Value/emergent results were called by telephone at the time of interpretation on 08/30/2022 at 7:59 am to provider Dr. Marisa Severin, who verbally acknowledged these results. Electronically Signed   By: Kennith Center M.D.   On: 08/30/2022 07:59   DG Chest Port 1 View  Result Date: 08/30/2022 CLINICAL DATA:  Chest pain.  Recent pulmonary thrombectomy. EXAM: PORTABLE CHEST 1 VIEW COMPARISON:  08/22/2022. FINDINGS: Left lung clear. Similar patchy airspace disease in the right mid lung with progressive patchy consolidative opacity at the right base. Right mid and lower lung cavitary lesions again noted, better characterized on recent CT scan. Possible small right pleural effusion. The cardiopericardial silhouette is within normal limits for size. Telemetry leads overlie the chest. IMPRESSION: 1. Cavitary lesions again noted right mid lung and peripheral right lung base. 2. Interval progression of patchy consolidative opacity in the inferior right lung. Electronically Signed   By: Kennith Center M.D.   On: 08/30/2022 05:46   US Venous Img Lower Bilateral (DVT)  Result Date: 08/23/2022 CLINICAL DATA:  Acute pulmonary emboli EXAM: BILATERAL LOWER EXTREMITY VENOUS DOPPLER ULTRASOUND TECHNIQUE: Gray-scale sonography with graded compression, as well as color Doppler and duplex ultrasound were performed to evaluate the lower extremity deep venous systems from the level of the common femoral vein and including the common femoral, femoral, profunda femoral, popliteal and calf veins including the posterior  tibial, peroneal and gastrocnemius veins when visible. Spectral Doppler was utilized to evaluate flow at rest and with distal augmentation maneuvers in the common femoral, femoral and popliteal veins. COMPARISON:  None Available. FINDINGS: RIGHT LOWER EXTREMITY Common Femoral Vein: No evidence of thrombus. Normal compressibility, respiratory phasicity and response to augmentation. Saphenofemoral Junction: No evidence of thrombus. Normal compressibility and flow on color Doppler imaging. Profunda Femoral Vein: No evidence of thrombus. Normal compressibility and flow on color Doppler imaging. Femoral Vein: No evidence of thrombus. Normal compressibility, respiratory phasicity and response to augmentation. Popliteal Vein: No evidence of thrombus. Normal compressibility, respiratory phasicity and response to augmentation. Calf Veins: No evidence of thrombus. Normal compressibility and flow on color Doppler imaging. Other: Superficial thrombosis noted of the proximal calf small saphenous vein. No propagation into the deep venous system. LEFT LOWER EXTREMITY Common Femoral Vein: No evidence of thrombus. Normal compressibility, respiratory phasicity and response to augmentation. Saphenofemoral Junction: No evidence of thrombus. Normal compressibility and flow on color Doppler imaging. Profunda Femoral Vein: No evidence of thrombus. Normal compressibility and flow on color Doppler imaging. Femoral Vein: No evidence of thrombus. Normal compressibility, respiratory phasicity and response to augmentation. Popliteal Vein: No evidence of thrombus. Normal compressibility, respiratory phasicity and response to augmentation. Calf Veins: No evidence of thrombus. Normal compressibility and flow on color Doppler imaging. IMPRESSION: No evidence of deep venous thrombosis in either lower extremity. Right proximal calf small saphenous vein superficial thrombosis (very minor) Electronically Signed   By: Judie Petit.  Shick M.D.   On: 08/23/2022  09:48   PERIPHERAL VASCULAR CATHETERIZATION  Result Date: 08/22/2022 See surgical note for result.  ECHOCARDIOGRAM COMPLETE  Result Date: 08/22/2022    ECHOCARDIOGRAM REPORT   Patient Name:   Catherine [name] Date of Exam: 08/22/2022 Medical Rec #:  [id number]           Height:       59.0 in Accession #:    [id number]          Weight:       120.1 lb Date of Birth:  08/01/2000  BSA:          1.485 m Patient Age:    21 years            BP:           90/42 mmHg Patient Gender: F                   HR:           103 bpm. Exam Location:  [facility] Procedure: 2D Echo, Color Doppler and Cardiac Doppler STAT ECHO Indications:     Pulmonary embolus I26.09  History:         Patient has prior history of Echocardiogram examinations, most                  recent 09/27/2021. Risk Factors:Diabetes and Dyslipidemia.                  Tobacco dependence.  Sonographer:     Cristela Blue Referring Phys:  [id number] JENNIFER YATES Diagnosing Phys: Yvonne Kendall MD IMPRESSIONS  1. Left ventricular ejection fraction, by estimation, is 60 to 65%. The left ventricle has normal function. The left ventricle has no regional wall motion abnormalities. There is mild left ventricular hypertrophy. Left ventricular diastolic parameters are consistent with Grade I diastolic dysfunction (impaired relaxation). There is the interventricular septum is flattened in systole and diastole, consistent with right ventricular pressure and volume overload.  2. Right ventricular systolic function demonstrates moderate hypokinesis of the free wall with sparing of the apex consistent with acute cor pulmonale (McConnell's sign). The right ventricular size is moderately enlarged. Moderately increased right ventricular wall thickness. There is severely elevated pulmonary artery systolic pressure. The estimated right ventricular systolic pressure is 94.4 mmHg.  3. A small pericardial effusion is present.  4. The mitral valve is normal in structure. No evidence of  mitral valve regurgitation. No evidence of mitral stenosis.  5. Tricuspid valve regurgitation is moderate to severe.  6. The aortic valve is tricuspid. Aortic valve regurgitation is not visualized. No aortic stenosis is present.  7. The inferior vena cava is normal in size with greater than 50% respiratory variability, suggesting right atrial pressure of 3 mmHg. FINDINGS  Left Ventricle: Left ventricular ejection fraction, by estimation, is 60 to 65%. The left ventricle has normal function. The left ventricle has no regional wall motion abnormalities. The left ventricular internal cavity size was normal in size. There is  mild left ventricular hypertrophy. The interventricular septum is flattened in systole and diastole, consistent with right ventricular pressure and volume overload. Left ventricular diastolic parameters are consistent with Grade I diastolic dysfunction (impaired relaxation). Right Ventricle: The right ventricular size is moderately enlarged. Moderately increased right ventricular wall thickness. Right ventricular systolic function demonstrates moderate hypokinesis of the free wall with sparing of the apex consistent with acute cor pulmonale (McConnell's sign). There is severely elevated pulmonary artery systolic pressure. The tricuspid regurgitant velocity is 4.78 m/s, and with an assumed right atrial pressure of 3 mmHg, the estimated right ventricular systolic pressure is 94.4 mmHg. Left Atrium: Left atrial size was normal in size. Right Atrium: Right atrial size was normal in size. Pericardium: A small pericardial effusion is present. Mitral Valve: The mitral valve is normal in structure. No evidence of mitral valve regurgitation. No evidence of mitral valve stenosis. Tricuspid Valve: The tricuspid valve is normal in structure. Tricuspid valve regurgitation is moderate to severe. Aortic Valve: The aortic valve is tricuspid. Aortic valve regurgitation is not  visualized. No aortic stenosis is  present. Aortic valve mean gradient measures 2.0 mmHg. Aortic valve peak gradient measures 3.1 mmHg. Aortic valve area, by VTI measures 2.33 cm. Pulmonic Valve: The pulmonic valve was not well visualized. Pulmonic valve regurgitation is mild. No evidence of pulmonic stenosis. Aorta: The aortic root is normal in size and structure. Pulmonary Artery: The pulmonary artery is not well seen. Venous: The inferior vena cava is normal in size with greater than 50% respiratory variability, suggesting right atrial pressure of 3 mmHg. IAS/Shunts: The interatrial septum was not well visualized.  LEFT VENTRICLE PLAX 2D LVIDd:         2.30 cm   Diastology LVIDs:         1.50 cm   LV e' medial:    5.55 cm/s LV PW:         1.20 cm   LV E/e' medial:  12.8 LV IVS:        1.20 cm   LV e' lateral:   10.60 cm/s LVOT diam:     1.80 cm   LV E/e' lateral: 6.7 LV SV:         25 LV SV Index:   17 LVOT Area:     2.54 cm  RIGHT VENTRICLE RV Basal diam:  3.90 cm RV Mid diam:    3.25 cm RV S prime:     11.90 cm/s TAPSE (M-mode): 1.7 cm LEFT ATRIUM             Index       RIGHT ATRIUM           Index LA diam:        1.10 cm 0.74 cm/m  RA Area:     15.00 cm LA Vol (A2C):   10.6 ml 7.14 ml/m  RA Volume:   42.00 ml  28.28 ml/m LA Vol (A4C):   9.7 ml  6.54 ml/m LA Biplane Vol: 10.7 ml 7.20 ml/m  AORTIC VALVE                    PULMONIC VALVE AV Area (Vmax):    2.01 cm     PR End Diast Vel: 25.00 msec AV Area (Vmean):   1.97 cm AV Area (VTI):     2.33 cm AV Vmax:           88.20 cm/s AV Vmean:          60.900 cm/s AV VTI:            0.107 m AV Peak Grad:      3.1 mmHg AV Mean Grad:      2.0 mmHg LVOT Vmax:         69.50 cm/s LVOT Vmean:        47.100 cm/s LVOT VTI:          0.098 m LVOT/AV VTI ratio: 0.92  AORTA Ao Root diam: 2.10 cm MITRAL VALVE                TRICUSPID VALVE MV Area (PHT): 6.07 cm     TR Peak grad:   91.4 mmHg MV Decel Time: 125 msec     TR Vmax:        478.00 cm/s MV E velocity: 71.30 cm/s MV A velocity: 113.00 cm/s   SHUNTS MV E/A ratio:  0.63         Systemic VTI:  0.10 m  Systemic Diam: 1.80 cm Yvonne Kendall MD Electronically signed by Yvonne Kendall MD Signature Date/Time: 08/22/2022/2:28:41 PM    Final    CT Angio Chest PE W and/or Wo Contrast  Result Date: 08/22/2022 CLINICAL DATA:  Concern for pulmonary embolus. EXAM: CT ANGIOGRAPHY CHEST WITH CONTRAST TECHNIQUE: Multidetector CT imaging of the chest was performed using the standard protocol during bolus administration of intravenous contrast. Multiplanar CT image reconstructions and MIPs were obtained to evaluate the vascular anatomy. RADIATION DOSE REDUCTION: This exam was performed according to the departmental dose-optimization program which includes automated exposure control, adjustment of the mA and/or kV according to patient size and/or use of iterative reconstruction technique. CONTRAST:  75mL OMNIPAQUE IOHEXOL 350 MG/ML SOLN COMPARISON:  CT abdomen and pelvis September 26, 2021. FINDINGS: Cardiovascular: Filling defects within the right main pulmonary artery extending into multiple segmental and subsegmental branches predominantly involving the right lower and middle lobes. As well there are multiple left-sided segmental and subsegmental pulmonary artery filling defects. Right heart enlargement with a right ventricular to left ventricular ratio of 1.9. Normal caliber thoracic aorta. Mediastinum/Nodes: No suspicious thyroid nodule. No pathologically enlarged mediastinal or hilar lymph nodes. The esophagus is grossly unremarkable. Lungs/Pleura: Multifocal bilateral upper lobe predominant pulmonary nodules/masses many of which are cavitary. For reference: -cavitary right upper lobe pulmonary mass measures 3.4 x 2.0 cm on image 54/8 -cavitary left upper lobe pulmonary nodule measures 10 mm on image 32/8. Bronchiectasis with clustered nodularity in the dependent bilateral lower lobes. Multifocal ground-glass opacities with subpleural  sparing may reflect pulmonary hemorrhage or an infectious or inflammatory etiology. No pleural effusion.  No pneumothorax. Upper Abdomen: Reflux of contrast into the IVC and hepatic veins. Hyperdensity vs focus of hyperenhancement in the right upper pole kidney on image 121/6. Musculoskeletal: No acute osseous abnormality. Review of the MIP images confirms the above findings. IMPRESSION: 1. Acute bilateral pulmonary emboli with CT evidence of right heart strain (RV/LV Ratio = 1.9 ) consistent with at least submassive (intermediate risk) PE. The presence of right heart strain has been associated with an increased risk of morbidity and mortality. Please refer to the "Code PE Focused" order set in EPIC. 2. Multifocal bilateral upper lobe predominant pulmonary nodules/masses many of which are cavitary. Findings are favored to reflect an atypical infectious process such as septic emboli. 3. Multifocal ground-glass opacities with subpleural sparing likely reflects a combination of pulmonary hemorrhage and/or an infectious or inflammatory etiology. 4. Hyperdensity vs focus of hyperenhancement in the right upper pole kidney, nonspecific suggest further evaluation with CT of the abdomen and pelvis upon stabilization of patient. Critical Value/emergent results were called by telephone at the time of interpretation on 08/22/2022 at 9:15 am to provider Dr Marcello Moores, who verbally acknowledged these results. Electronically Signed   By: Maudry Mayhew M.D.   On: 08/22/2022 09:15   DG Chest Portable 1 View  Result Date: 08/22/2022 CLINICAL DATA:  22 year old female with history of shortness of breath. EXAM: PORTABLE CHEST 1 VIEW COMPARISON:  Chest x-ray 09/27/2021. FINDINGS: Widespread areas of interstitial prominence and architectural distortion are noted throughout the mid to lower lungs bilaterally (right greater than left), similar to the prior study from 09/26/2021, presumably areas of chronic post infectious or inflammatory  scarring. No pleural effusions. No pneumothorax. No evidence of pulmonary edema. Heart size is normal. Upper mediastinal contours are within normal limits. IMPRESSION: 1. Grossly abnormal chest x-ray with findings which appear to be chronic based on comparison with prior chest radiographs. Lung bases were  grossly abnormal on prior CT of the abdomen and pelvis 09/26/2021. Further evaluation with high-resolution chest CT is suggested to better delineate these findings which are very abnormal in a 22 year old patient. Electronically Signed   By: Trudie Reed M.D.   On: 08/22/2022 06:58

## 2022-09-08 NOTE — Assessment & Plan Note (Signed)
On Tacrolimus, and prednisone. Continue follow up with nephrology

## 2022-09-11 ENCOUNTER — Inpatient Hospital Stay: Payer: Medicaid Other

## 2022-09-11 ENCOUNTER — Telehealth: Payer: Self-pay

## 2022-09-11 DIAGNOSIS — I2609 Other pulmonary embolism with acute cor pulmonale: Secondary | ICD-10-CM

## 2022-09-11 DIAGNOSIS — I2699 Other pulmonary embolism without acute cor pulmonale: Secondary | ICD-10-CM | POA: Diagnosis not present

## 2022-09-11 LAB — PROTIME-INR
INR: 1.3 — ABNORMAL HIGH (ref 0.8–1.2)
Prothrombin Time: 16.2 seconds — ABNORMAL HIGH (ref 11.4–15.2)

## 2022-09-11 MED ORDER — ENOXAPARIN SODIUM 60 MG/0.6ML IJ SOSY: 1.0000 mg/kg | PREFILLED_SYRINGE | Freq: Two times a day (BID) | INTRAMUSCULAR | 0 refills | Status: DC

## 2022-09-11 NOTE — Addendum Note (Signed)
Addended by: Earlie Server on: 09/11/2022 02:54 PM   Modules accepted: Orders

## 2022-09-11 NOTE — Telephone Encounter (Signed)
Called and spoke to pt's mother, Catherine [name]. Informed her of INR results and that pt should continue same dosage of Coumadin ('4mg'$ ) and Lovenox. She verbalized understanding and she will inform pt of this.

## 2022-09-12 ENCOUNTER — Encounter: Payer: Self-pay | Admitting: Student in an Organized Health Care Education/Training Program

## 2022-09-12 ENCOUNTER — Ambulatory Visit (INDEPENDENT_AMBULATORY_CARE_PROVIDER_SITE_OTHER): Payer: Medicaid Other | Admitting: Student in an Organized Health Care Education/Training Program

## 2022-09-12 VITALS — BP 120/64 | HR 111 | Temp 97.8°F | Ht 59.0 in | Wt 116.2 lb

## 2022-09-12 DIAGNOSIS — J984 Other disorders of lung: Secondary | ICD-10-CM

## 2022-09-12 DIAGNOSIS — I2699 Other pulmonary embolism without acute cor pulmonale: Secondary | ICD-10-CM

## 2022-09-12 NOTE — Progress Notes (Unsigned)
Synopsis: Referred in *** by Catherine [name]:   1. Pulmonary infarction ([name]) 2. Cavitary lesion of lung  Initial chest CT on my review is notable for multiple foci of cavitary lesions in addition to mosaic attenuation with ground glass opacities.  Repeat chest CT in her second admission is again notable for the cavitary lesions in addition to an infiltrate/consolidation in the right lower lobe.  The differential includes infectious etiologies (bacterial, AFB, fungal, septic emboli) and non-infectious etiologies (infarct secondary to PE, vasculitis such as GPA or MPA, organizing pneumonia, langerhans cell histiocytosis).  Infectious workup has so far been negative as has the inflammatory workup for vasculitis.  Atypical/opportunistic infections remain on the differential as does organizing pneumonia, pulmonary langerhans cell histiocytosis ([facility]), and pulmonary infarct from the PEs. Other notable finding on my review of the CT is reticulation in the lower lobes, more so in the right lower lobe, that appears to be present on previous imaging (abdominal CT's performed in 2021 and 2023) albeit progressed.  Will initiate an infectious workup as well as a workup for auto-immune and vasculitic processes. She will require a high resolution chest CT and further investigations pending initial workup. This would be to rule out idiopathic interstitial pneumonias, vasculitis, langerhans cell histiocytosis, as well as infectious etiologies.   -respiratory and blood cultures -B-d-glucan, aspergillus galactomannan, cryptococcal antigen -urine histoplasma -ANA, ANCA -agree with CAP coverage for now  - CT CHEST HIGH RESOLUTION; Future   Return in about 2 months (around 11/12/2022).  I spent *** minutes caring for this patient today, including {EM billing:[id number]}  Catherine Reichert, MD [name] 09/12/2022 2:56 PM    End of visit medications:  No orders  of the defined types were placed in this encounter.    Current Outpatient Medications:    acetaminophen (TYLENOL) 325 MG tablet, Take 2 tablets (650 mg total) by mouth every 6 (six) hours as needed for mild pain or headache., Disp: 20 tablet, Rfl: 0   benzonatate (TESSALON PERLES) 100 MG capsule, Take 1 capsule (100 mg total) by mouth 3 (three) times daily as needed for cough., Disp: 30 capsule, Rfl: 1   benzonatate (TESSALON) 200 MG capsule, Take 1 capsule (200 mg total) by mouth 3 (three) times daily as needed for cough., Disp: 20 capsule, Rfl: 0   blood glucose meter kit and supplies, Dispense based on patient and insurance preference. Use up to four times daily as directed. (FOR ICD-10 E10.9, E11.9)., Disp: 1 each, Rfl: 0   enoxaparin (LOVENOX) 60 MG/0.6ML injection, Inject 0.575 mLs (57.5 mg total) into the skin every 12 (twelve) hours., Disp: 14 mL, Rfl: 0   feeding supplement, GLUCERNA SHAKE, (GLUCERNA SHAKE) LIQD, Take 237 mLs by mouth 2 (two) times daily between meals., Disp: , Rfl: 0   glucagon 1 MG injection, Inject 1 mg into the muscle as needed., Disp: , Rfl:    LANTUS 100 UNIT/ML injection, Inject 0.15 mLs (15 Units total) into the skin at bedtime., Disp: 4.5 mL, Rfl: 2   Multiple Vitamin (MULTIVITAMIN WITH MINERALS) TABS tablet, Take 1 tablet by mouth daily., Disp: , Rfl:    predniSONE (DELTASONE) 50 MG tablet, Take 1 tablet (50 mg total) by mouth daily with breakfast., Disp: 30 tablet, Rfl: 0   sulfamethoxazole-trimethoprim (BACTRIM DS) 800-160 MG tablet, Take 1 tablet by mouth every Monday, Wednesday, and Friday., Disp: 12 tablet, Rfl: 2   tacrolimus (PROGRAF) 1 MG capsule, Take 2 capsules (2 mg  total) by mouth 2 (two) times daily., Disp: 120 capsule, Rfl: 2   torsemide (DEMADEX) 20 MG tablet, Take 1 tablet (20 mg total) by mouth daily., Disp: 30 tablet, Rfl: 0   Vitamin D, Ergocalciferol, (DRISDOL) 1.25 MG (50000 UNIT) CAPS capsule, Take 50,000 Units by mouth every 7 (seven)  days., Disp: , Rfl:    warfarin (COUMADIN) 2 MG tablet, Take 1 tablet (2 mg total) by mouth daily., Disp: 30 tablet, Rfl: 0   chlorpheniramine-HYDROcodone (TUSSIONEX) 10-8 MG/5ML, Take 5 mLs by mouth every 12 (twelve) hours as needed for cough. (Patient not taking: Reported on 09/12/2022), Disp: 115 mL, Rfl: 0   insulin aspart (NOVOLOG) 100 UNIT/ML injection, Inject 5-8 Units into the skin 3 (three) times daily before meals., Disp: 7.2 mL, Rfl: 2   Subjective:   PATIENT ID: [name] GENDER: female DOB: 05/12/2001, MRN: [id number]  Chief Complaint  Patient presents with   pulmonary consult    Admission 2/28--SOB with exertion and dry cough.      HPI  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  Patient has had multiple recent admissions secondary to pulmonary embolism.  She was admitted to [name] [place] (06/30/2022 - 07/05/2022) at which point she was discharged on Eliquis.  She reports running out of the Eliquis and not taking it after that.  She then presented to our hospital on 20 February with shortness of breath at which point she was diagnosed with pulmonary embolism.  She was found to have cavitary lung lesions on her chest CT. I saw her in consultation during said admission on 08/22/2022 at which point I initiated a workup for cavitary pulmonary lesions which has returned negative (beta D glucan, Aspergillus galactomannan, cryptococcal antigen, ANA, c-ANCA, p-ANCA, Quant gold, respiratory cultures, and blood cultures).  She underwent thrombectomy and was discharged on Eliquis for the management of her PE only to represent on 08/30/2022 again with shortness of breath. Repeat CT scan also showed a right lower lobe infiltrate in addition to her cavitary lesions. She was seen in consultation by my  colleague Dr. Mortimer [name] who felt that the risk of procedural intervention (bronchoscopy) greatly outweigh the benefits.  She was started on broad-spectrum antibiotics and again infectious workup was unrevealing. 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  Patient was discharged on 09/05/2022 with improvement.  Today, she is presenting to clinic accompanied by her father.  She reports that her shortness of breath has improved but continues to experience some exertional dyspnea.  She continues to have a cough that is nonproductive.  She does not have any fevers, chills, night sweats, weight loss, sputum production, hemoptysis, GI, or GU symptoms.  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  She is followed by nephrology outpatient for minimal change disease. Patient is maintained on prednisone and tacrolimus for immune suppression for her minimal change disease, and had previously received Rituximab but is not on it. QuantGOLD sent on 07/05/2022 was negative. She was also started on Bactrim for prophylaxis.   Patient lives with her grandfather. She was previously working Forensic scientist. Her grandfather has a dog that stays outside. She has no birds and no other exposures.  Patient reports vaping marijuana products as well as nicotine products but has quit.  No other inhalational exposures reported.   Ancillary information including prior medications, full medical/surgical/family/social histories, and PFTs (when available) are listed below and have been reviewed.   ROS   Objective:   Vitals:   09/12/22 1359  BP: 120/64  Pulse: (!) 111  Temp: 97.8 F (36.6 C)  TempSrc: Temporal  SpO2: 96%  Weight: 116 lb 3.2 oz (52.7 kg)  Height: '4\' 11"'$  (1.499 m)   96% on *** LPM *** RA BMI Readings from Last 3 Encounters:  09/12/22 23.47 kg/m   09/08/22 23.61 kg/m  08/30/22 25.97 kg/m   Wt Readings from Last 3 Encounters:  09/12/22 116 lb 3.2 oz (52.7 kg)  09/08/22 116 lb 14.4 oz (53 kg)  08/30/22 128 lb 9.6 oz (58.3 kg)    Physical Exam    Ancillary Information    Past Medical History:  Diagnosis Date   Diabetes mellitus without complication ([place])    Dyslipidemia    Hypoalbuminemia    Marijuana abuse    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  Tobacco Use   Smoking status: Former    Types: E-cigarettes    Quit date: 07/2022    Years since quitting: 0.1   Smokeless tobacco: Never   Tobacco comments:    Quit substance use since the beginning of the year   Vaping Use   Vaping Use: Former  Substance and Sexual Activity   Alcohol use: Not Currently   Drug use: Yes    Types: Marijuana    Comment: Quit using marijuana approx earlier this year   Sexual activity: Not Currently  Other Topics Concern   Not on file  Social History Narrative   Not on file   Social Determinants of Health   Financial Resource Strain: Not on file  Food Insecurity: No Food Insecurity (09/08/2022)   Hunger Vital Sign    Worried About Running Out of Food in the Last Year: Never true    Ran Out of Food in the Last Year: Never true  Transportation Needs: No Transportation Needs (09/08/2022)   PRAPARE - Hydrologist  (Medical): No    Lack of Transportation (Non-Medical): No  Physical Activity: Not on file  Stress: Not on file  Social Connections: Not on file  Intimate Partner Violence: Not At Risk (09/08/2022)   Humiliation, Afraid, Rape, and Kick questionnaire    Fear of Current or Ex-Partner: No    Emotionally Abused: No    Physically Abused: No    Sexually Abused: No     No Known Allergies   CBC    Component Value Date/Time   WBC 23.9 (H) 09/08/2022 1047   WBC 27.3 (H) 09/05/2022 0347   RBC 4.42 09/08/2022 1047   HGB 12.6 09/08/2022 1047  HCT 38.8 09/08/2022 1047   PLT 221 09/08/2022 1047   MCV 87.8 09/08/2022 1047   MCH 28.5 09/08/2022 1047   MCHC 32.5 09/08/2022 1047   RDW 15.5 09/08/2022 1047   LYMPHSABS 9.6 (H) 09/08/2022 1047   MONOABS 1.3 (H) 09/08/2022 1047   EOSABS 0.3 09/08/2022 1047   BASOSABS 0.1 09/08/2022 1047    Pulmonary Functions Testing Results:     No data to display          Outpatient Medications Prior to Visit  Medication Sig Dispense Refill   acetaminophen (TYLENOL) 325 MG tablet Take 2 tablets (650 mg total) by mouth every 6 (six) hours as needed for mild pain or headache. 20 tablet 0   benzonatate (TESSALON PERLES) 100 MG capsule Take 1 capsule (100 mg total) by mouth 3 (three) times daily as needed for cough. 30 capsule 1   benzonatate (TESSALON) 200 MG capsule Take 1 capsule (200 mg total) by mouth 3 (three) times daily as needed for cough. 20 capsule 0   blood glucose meter kit and supplies Dispense based on patient and insurance preference. Use up to four times daily as directed. (FOR ICD-10 E10.9, E11.9). 1 each 0   enoxaparin (LOVENOX) 60 MG/0.6ML injection Inject 0.575 mLs (57.5 mg total) into the skin every 12 (twelve) hours. 14 mL 0   feeding supplement, GLUCERNA SHAKE, (GLUCERNA SHAKE) LIQD Take 237 mLs by mouth 2 (two) times daily between meals.  0   glucagon 1 MG injection Inject 1 mg into the muscle as needed.     LANTUS 100 UNIT/ML  injection Inject 0.15 mLs (15 Units total) into the skin at bedtime. 4.5 mL 2   Multiple Vitamin (MULTIVITAMIN WITH MINERALS) TABS tablet Take 1 tablet by mouth daily.     predniSONE (DELTASONE) 50 MG tablet Take 1 tablet (50 mg total) by mouth daily with breakfast. 30 tablet 0   sulfamethoxazole-trimethoprim (BACTRIM DS) 800-160 MG tablet Take 1 tablet by mouth every Monday, Wednesday, and Friday. 12 tablet 2   tacrolimus (PROGRAF) 1 MG capsule Take 2 capsules (2 mg total) by mouth 2 (two) times daily. 120 capsule 2   torsemide (DEMADEX) 20 MG tablet Take 1 tablet (20 mg total) by mouth daily. 30 tablet 0   Vitamin D, Ergocalciferol, (DRISDOL) 1.25 MG (50000 UNIT) CAPS capsule Take 50,000 Units by mouth every 7 (seven) days.     warfarin (COUMADIN) 2 MG tablet Take 1 tablet (2 mg total) by mouth daily. 30 tablet 0   chlorpheniramine-HYDROcodone (TUSSIONEX) 10-8 MG/5ML Take 5 mLs by mouth every 12 (twelve) hours as needed for cough. (Patient not taking: Reported on 09/12/2022) 115 mL 0   insulin aspart (NOVOLOG) 100 UNIT/ML injection Inject 5-8 Units into the skin 3 (three) times daily before meals. 7.2 mL 2   No facility-administered medications prior to visit.

## 2022-09-14 ENCOUNTER — Telehealth: Payer: Self-pay

## 2022-09-14 ENCOUNTER — Other Ambulatory Visit: Payer: Self-pay

## 2022-09-14 ENCOUNTER — Inpatient Hospital Stay: Payer: Medicaid Other

## 2022-09-14 DIAGNOSIS — I2699 Other pulmonary embolism without acute cor pulmonale: Secondary | ICD-10-CM | POA: Diagnosis not present

## 2022-09-14 DIAGNOSIS — I2609 Other pulmonary embolism with acute cor pulmonale: Secondary | ICD-10-CM

## 2022-09-14 LAB — PROTIME-INR
INR: 1.2 (ref 0.8–1.2)
Prothrombin Time: 15.3 seconds — ABNORMAL HIGH (ref 11.4–15.2)

## 2022-09-14 NOTE — Telephone Encounter (Signed)
Called and spoke to pt's mother and informed her that Pt should increase coumadin to 6g daily and continue lovenox injections. She requested 8am appts for INR rechecks next week. Will send pt Mychart message as well.   Please schedule pt for labs. Mother is aware of appts and will informed [facility] on 3/18 and 3/21 @ 8am

## 2022-09-15 ENCOUNTER — Telehealth: Payer: Self-pay | Admitting: Student in an Organized Health Care Education/Training Program

## 2022-09-15 NOTE — Telephone Encounter (Signed)
Received an email from patient's mother, Vivia Budge- email address  [email or web address],   with a FMLA form attached for her care of the patient.  I replied with an email and advised her of the 2 pages she will need to complete and that there will be a $29 fee.  Also let her know it would take up to 10 business days to compelte.

## 2022-09-18 ENCOUNTER — Telehealth: Payer: Self-pay

## 2022-09-18 ENCOUNTER — Inpatient Hospital Stay: Payer: Medicaid Other

## 2022-09-18 ENCOUNTER — Other Ambulatory Visit: Payer: Self-pay | Admitting: Oncology

## 2022-09-18 DIAGNOSIS — I2699 Other pulmonary embolism without acute cor pulmonale: Secondary | ICD-10-CM | POA: Diagnosis not present

## 2022-09-18 DIAGNOSIS — I2609 Other pulmonary embolism with acute cor pulmonale: Secondary | ICD-10-CM

## 2022-09-18 LAB — PROTIME-INR
INR: 1.4 — ABNORMAL HIGH (ref 0.8–1.2)
Prothrombin Time: 16.8 seconds — ABNORMAL HIGH (ref 11.4–15.2)

## 2022-09-18 MED ORDER — ENOXAPARIN SODIUM 60 MG/0.6ML IJ SOSY: 1.0000 mg/kg | PREFILLED_SYRINGE | Freq: Two times a day (BID) | INTRAMUSCULAR | 0 refills | Status: DC

## 2022-09-18 MED ORDER — WARFARIN SODIUM 2 MG PO TABS: 6.0000 mg | ORAL_TABLET | Freq: Every day | ORAL | 0 refills | Status: DC

## 2022-09-18 NOTE — Telephone Encounter (Signed)
INR today 1.4. Spoke to [place] (mother) and informed her that pt should continue on 6mg  coumadin and lovenox injections. She verbalized understanding and confirmed pt's lab appt for Thursday. Informed her that refill have been sent to [facility] on [address]

## 2022-09-19 ENCOUNTER — Ambulatory Visit: Admission: RE | Admit: 2022-09-19 | Payer: Medicaid Other | Source: Ambulatory Visit

## 2022-09-20 ENCOUNTER — Emergency Department: Payer: Medicaid Other

## 2022-09-20 ENCOUNTER — Other Ambulatory Visit: Payer: Self-pay

## 2022-09-20 ENCOUNTER — Emergency Department
Admission: EM | Admit: 2022-09-20 | Discharge: 2022-09-20 | Disposition: A | Discharge status: Home / Self Care | Payer: Medicaid Other | Attending: Emergency Medicine | Admitting: Emergency Medicine

## 2022-09-20 DIAGNOSIS — R059 Cough, unspecified: Secondary | ICD-10-CM | POA: Diagnosis present

## 2022-09-20 DIAGNOSIS — I2694 Multiple subsegmental pulmonary emboli without acute cor pulmonale: Secondary | ICD-10-CM | POA: Insufficient documentation

## 2022-09-20 DIAGNOSIS — R042 Hemoptysis: Secondary | ICD-10-CM

## 2022-09-20 DIAGNOSIS — Z20822 Contact with and (suspected) exposure to covid-19: Secondary | ICD-10-CM | POA: Diagnosis not present

## 2022-09-20 DIAGNOSIS — R7989 Other specified abnormal findings of blood chemistry: Secondary | ICD-10-CM | POA: Insufficient documentation

## 2022-09-20 LAB — URINALYSIS, ROUTINE W REFLEX MICROSCOPIC
Bilirubin Urine: NEGATIVE
Glucose, UA: >=500 mg/dL — AB
Hgb urine dipstick: NEGATIVE
Ketones, ur: 5 mg/dL — AB
Nitrite: NEGATIVE
Protein, ur: >=300 mg/dL — AB
Specific Gravity, Urine: 1.027 (ref 1.005–1.030)
pH: 6 (ref 5.0–8.0)

## 2022-09-20 LAB — COMPREHENSIVE METABOLIC PANEL
ALT: 119 U/L — ABNORMAL HIGH (ref 0–44)
AST: 49 U/L — ABNORMAL HIGH (ref 15–41)
Albumin: 2.2 g/dL — ABNORMAL LOW (ref 3.5–5.0)
Alkaline Phosphatase: 277 U/L — ABNORMAL HIGH (ref 38–126)
Anion gap: 9 (ref 5–15)
BUN: 21 mg/dL — ABNORMAL HIGH (ref 6–20)
CO2: 15 mmol/L — ABNORMAL LOW (ref 22–32)
Calcium: 8.2 mg/dL — ABNORMAL LOW (ref 8.9–10.3)
Chloride: 110 mmol/L (ref 98–111)
Creatinine, Ser: 1.25 mg/dL — ABNORMAL HIGH (ref 0.44–1.00)
GFR, Estimated: >60 mL/min (ref >60)
Glucose, Bld: 343 mg/dL — ABNORMAL HIGH (ref 70–99)
Potassium: 4.1 mmol/L (ref 3.5–5.1)
Sodium: 134 mmol/L — ABNORMAL LOW (ref 135–145)
Total Bilirubin: 0.6 mg/dL (ref 0.3–1.2)
Total Protein: 5.8 g/dL — ABNORMAL LOW (ref 6.5–8.1)

## 2022-09-20 LAB — CBC
HCT: 40.8 % (ref 36.0–46.0)
Hemoglobin: 12.7 g/dL (ref 12.0–15.0)
MCH: 27.9 pg (ref 26.0–34.0)
MCHC: 31.1 g/dL (ref 30.0–36.0)
MCV: 89.7 fL (ref 80.0–100.0)
Platelets: 479 10*3/uL — ABNORMAL HIGH (ref 150–400)
RBC: 4.55 MIL/uL (ref 3.87–5.11)
RDW: 15.9 % — ABNORMAL HIGH (ref 11.5–15.5)
WBC: 11.7 10*3/uL — ABNORMAL HIGH (ref 4.0–10.5)
nRBC: 0 % (ref 0.0–0.2)

## 2022-09-20 LAB — RESP PANEL BY RT-PCR (RSV, FLU A&B, COVID)  RVPGX2
Influenza A by PCR: NEGATIVE
Influenza B by PCR: NEGATIVE
Resp Syncytial Virus by PCR: NEGATIVE
SARS Coronavirus 2 by RT PCR: NEGATIVE

## 2022-09-20 LAB — BLOOD GAS, VENOUS
Acid-base deficit: 6.3 mmol/L — ABNORMAL HIGH (ref 0.0–2.0)
Bicarbonate: 19 mmol/L — ABNORMAL LOW (ref 20.0–28.0)
O2 Saturation: 70 %
Patient temperature: 37
pCO2, Ven: 36 mmHg — ABNORMAL LOW (ref 44–60)
pH, Ven: 7.33 (ref 7.25–7.43)
pO2, Ven: 46 mmHg — ABNORMAL HIGH (ref 32–45)

## 2022-09-20 LAB — PROTIME-INR
INR: 1.8 — ABNORMAL HIGH (ref 0.8–1.2)
Prothrombin Time: 21 seconds — ABNORMAL HIGH (ref 11.4–15.2)

## 2022-09-20 LAB — POC URINE PREG, ED: Preg Test, Ur: NEGATIVE

## 2022-09-20 LAB — TROPONIN I (HIGH SENSITIVITY)
Troponin I (High Sensitivity): 20 ng/L — ABNORMAL HIGH (ref <18)
Troponin I (High Sensitivity): 15 ng/L (ref <18)

## 2022-09-20 LAB — LIPASE, BLOOD: Lipase: 29 U/L (ref 11–51)

## 2022-09-20 MED ORDER — IOHEXOL 350 MG/ML SOLN
75.0000 mL | Freq: Once | INTRAVENOUS | Status: AC | PRN
  Administered 2022-09-20: 75 mL via INTRAVENOUS

## 2022-09-20 MED ORDER — ENOXAPARIN SODIUM 60 MG/0.6ML IJ SOSY
1.0000 mg/kg | PREFILLED_SYRINGE | Freq: Once | INTRAMUSCULAR | Status: AC
  Administered 2022-09-20: 57.5 mg via SUBCUTANEOUS
  Filled 2022-09-20: qty 0.6

## 2022-09-20 MED ORDER — LACTATED RINGERS IV BOLUS
1000.0000 mL | Freq: Once | INTRAVENOUS | Status: AC
  Administered 2022-09-20: 1000 mL via INTRAVENOUS

## 2022-09-20 NOTE — Discharge Instructions (Addendum)
We recommended admission for your coughing up blood but given your resolution of symptoms and your CT scan was improving you have declined admission.  There is a risk for death or permanent disability and having future coughing that could be a lot worse.  If you are willing to stay in the hospital they would trend her hemoglobin levels and potentially do a bronchoscopy on you if the bleeding was continued.  However at this time you continue to decline admission but can return if you develop worsening bleeding or any other concerns.  Your liver test were slightly elevated and this can be followed up outpatient and your sugars were elevated as well.

## 2022-09-20 NOTE — ED Provider Notes (Signed)
[facility] Provider Note    Event Date/Time   First MD Initiated Contact with Patient 09/20/22 1621     (approximate)   History   Hematemesis   HPI  Catherine [name] is a 22 y.o. female with history of minimal-change disease, nephrotic syndrome, PE who comes in with concerns for coughing up blood.  I reviewed the note where patient has had recurrent PEs with infarcts he had a thrombectomy with Dr. Lucky Cowboy in February 20 however he had to be readmitted and is bridging Lovenox to warfarin due to failure of Eliquis they did not recommend IVC filter due to hypercoagulable state.  Patient reports that she while driving home from work started coughing up some blood.  She reports her first 2 coughs were normal that she started coughing up blood-tinged sputum.  She reports that this lasted 5 to 10 minutes and that when it was happening she started to feel short of breath.  However she reports having resolution of symptoms at this time and she states that she feels much better and she states that she just was worried about it in the morning when it.  She reports that with her prior blood clots she is never coughed up blood previously.  This is the first time that this happened.  She denies any falls hitting her head, abdominal pain or swelling in her legs.  She states that she feels that her baseline self at this time patient reports being compliant with her Lovenox warfarin.      Physical Exam   Triage Vital Signs: ED Triage Vitals  Enc Vitals Group     BP 09/20/22 1553 111/81     Pulse Rate 09/20/22 1553 (!) 110     Resp 09/20/22 1553 (!) 24     Temp 09/20/22 1553 98 F (36.7 C)     Temp Source 09/20/22 1553 Oral     SpO2 09/20/22 1553 98 %     Weight 09/20/22 1550 116 lb 3.2 oz (52.7 kg)     Height 09/20/22 1550 4\' 11"  (1.499 m)     Head Circumference --      Peak Flow --      Pain Score 09/20/22 1550 8     Pain Loc --      Pain Edu? --      Excl. in [place]? --      Most recent vital signs: Vitals:   09/20/22 1553  BP: 111/81  Pulse: (!) 110  Resp: (!) 24  Temp: 98 F (36.7 C)  SpO2: 98%     General: Awake, no distress.  CV:  Good peripheral perfusion.  Resp:  Normal effort.  Abd:  No distention.  Soft nontender Other:  No swelling in legs.  No calf tenderness   ED Results / Procedures / Treatments   Labs (all labs ordered are listed, but only abnormal results are displayed) Labs Reviewed  COMPREHENSIVE METABOLIC PANEL - Abnormal; Notable for the following components:      Result Value   Sodium 134 (*)    CO2 15 (*)    Glucose, Bld 343 (*)    BUN 21 (*)    Creatinine, Ser 1.25 (*)    Calcium 8.2 (*)    Total Protein 5.8 (*)    Albumin 2.2 (*)    AST 49 (*)    ALT 119 (*)    Alkaline Phosphatase 277 (*)    All other components within normal limits  CBC - Abnormal; Notable for the following components:   WBC 11.7 (*)    RDW 15.9 (*)    Platelets 479 (*)    All other components within normal limits  PROTIME-INR - Abnormal; Notable for the following components:   Prothrombin Time 21.0 (*)    INR 1.8 (*)    All other components within normal limits  TROPONIN I (HIGH SENSITIVITY) - Abnormal; Notable for the following components:   Troponin I (High Sensitivity) 20 (*)    All other components within normal limits  LIPASE, BLOOD  URINALYSIS, ROUTINE W REFLEX MICROSCOPIC  POC URINE PREG, ED  TYPE AND SCREEN     EKG  My interpretation of EKG:  Sinus tachycardia rate of 109 without any ST elevation or T wave inversions, normal intervals  RADIOLOGY I have reviewed the CT personally and interpreted and bilateral PE noted.  PROCEDURES:  Critical Care performed: No  Procedures   MEDICATIONS ORDERED IN ED: Medications  lactated ringers bolus 1,000 mL (1,000 mLs Intravenous New Bag/Given 09/20/22 1721)  iohexol (OMNIPAQUE) 350 MG/ML injection 75 mL (75 mLs Intravenous Contrast Given 09/20/22 1727)     IMPRESSION  / MDM / ASSESSMENT AND PLAN / ED COURSE  I reviewed the triage vital signs and the nursing notes.   Patient's presentation is most consistent with acute presentation with potential threat to life or bodily function.   Differential is PE, pulmonary mass, bronchitis.  Given the hemoptysis that is now new we will get CT PE to further evaluate.  Pregnancy test is negative lipase is normal.  CMP shows slightly low bicarb and her glucose was elevated into the 300s will add on VBG but anion gap appears normal.  CBC shows stable hemoglobin.  Troponin is around baseline INR is 1.8  Venous blood glass does show normal pH and her bicarb is at 19 so seems less likely DKA.  Patient was given some fluids for her sugars.  IMPRESSION: 1. Relatively similar appearance or minimally improved bilateral pulmonary emboli compared to the CT of 08/30/2022. There is near occlusive embolus involving the right middle lobe branch. There is evidence of right heart straining. 2. Large area of consolidation in the right middle lobe slightly decreased in size since the prior CT. Additional scattered ground-glass and nodular densities as well as cavitary lesions bilaterally similar to prior CT. 3. Probable trace bilateral pleural effusions.  Discussed with patient that the areas of consolidation and PE are improved.  I did call radiology and confirm that this right middle lobe branch occlusive embolus was there previously and the right heart strain is actually improved from prior.  I discussed with patient however even though it is improving the coughing up blood is still very concerning and I would recommend admission for monitoring overnight.  Patient does not want to stay.  She states that she has not had any additional coughing up blood and would like to go home.  Discussed with her that pulmonary may want to do a bronchoscopy on her to make sure there is nothing that is actively bleeding that she is very high risk given  she is on a blood thinner but she continues to decline admission.  She understands the risk for death or permanent disability she has the capacity to make this decision but she is unwilling to be monitored overnight in the hospital.  She states that she will return if her symptoms are worsening.  I continue to explain my concern with her elevated  heart rate and she states that her heart rate always runs as fast.  Her troponins are  stable a downtrending.  COVID flu were negative.  LFTs slightly elevated but she is nontender in her abdomen.  I attempted to call pulmonary to update them about the blood-tinged sputum but they were off call so unable to get a hold of them.  I again tried to rediscussed with patient about stay but she continues to decline  7:40 PM with Dr. Lucky Cowboy and no interventions per him from a vascular standpoint given the CT is actually improving.  Recommends continuing anticoagulation.  7:42 PM  I again  discussed with patient I recommended admission she is able to repeat back to me the risk of leaving.  However she continues to deny admission and requesting dc home.  Patient remains asymptomatic at this time reports being at her baseline self.  The patient is on the cardiac monitor to evaluate for evidence of arrhythmia and/or significant heart rate changes.      FINAL CLINICAL IMPRESSION(S) / ED DIAGNOSES   Final diagnoses:  Hemoptysis  Multiple subsegmental pulmonary emboli without acute cor pulmonale ([place])     Rx / DC Orders   ED Discharge Orders     None        Note:  This document was prepared using Dragon voice recognition software and may include unintentional dictation errors.   Vanessa [place], MD 09/20/22 [phone]

## 2022-09-20 NOTE — ED Triage Notes (Addendum)
Pt to ED via POV from home. Pt reports she started coughing up blood 48mins ago. Pt CP and SOB. Pt is on warfarin and Lovenox injections for hx PE. Pt labored and coughing in triage spitting up small amounts of blood. Pt denies etoh or drug use.

## 2022-09-21 ENCOUNTER — Inpatient Hospital Stay: Payer: Medicaid Other

## 2022-09-21 DIAGNOSIS — I2699 Other pulmonary embolism without acute cor pulmonale: Secondary | ICD-10-CM | POA: Diagnosis not present

## 2022-09-21 DIAGNOSIS — I2609 Other pulmonary embolism with acute cor pulmonale: Secondary | ICD-10-CM

## 2022-09-21 LAB — PROTIME-INR
INR: 2 — ABNORMAL HIGH (ref 0.8–1.2)
Prothrombin Time: 22.8 seconds — ABNORMAL HIGH (ref 11.4–15.2)

## 2022-09-21 NOTE — Telephone Encounter (Addendum)
Pt informed of results and MD recommendation via mychart. Please schedule patient for labs next week Monday and Thursday @ 8am. Please contact pt/ mother with lab appts.

## 2022-09-25 ENCOUNTER — Emergency Department: Payer: Medicaid Other

## 2022-09-25 ENCOUNTER — Encounter: Payer: Self-pay | Admitting: Radiology

## 2022-09-25 ENCOUNTER — Inpatient Hospital Stay: Payer: Medicaid Other

## 2022-09-25 ENCOUNTER — Emergency Department
Admission: EM | Admit: 2022-09-25 | Discharge: 2022-09-25 | Disposition: A | Discharge status: Home / Self Care | Payer: Medicaid Other | Attending: Emergency Medicine | Admitting: Emergency Medicine

## 2022-09-25 DIAGNOSIS — Z1152 Encounter for screening for COVID-19: Secondary | ICD-10-CM | POA: Insufficient documentation

## 2022-09-25 DIAGNOSIS — M898X1 Other specified disorders of bone, shoulder: Secondary | ICD-10-CM

## 2022-09-25 DIAGNOSIS — M546 Pain in thoracic spine: Secondary | ICD-10-CM | POA: Diagnosis not present

## 2022-09-25 DIAGNOSIS — R739 Hyperglycemia, unspecified: Secondary | ICD-10-CM | POA: Insufficient documentation

## 2022-09-25 DIAGNOSIS — E875 Hyperkalemia: Secondary | ICD-10-CM | POA: Diagnosis not present

## 2022-09-25 DIAGNOSIS — I2609 Other pulmonary embolism with acute cor pulmonale: Secondary | ICD-10-CM

## 2022-09-25 DIAGNOSIS — I2699 Other pulmonary embolism without acute cor pulmonale: Secondary | ICD-10-CM | POA: Diagnosis not present

## 2022-09-25 DIAGNOSIS — R0602 Shortness of breath: Secondary | ICD-10-CM | POA: Diagnosis present

## 2022-09-25 DIAGNOSIS — M25511 Pain in right shoulder: Secondary | ICD-10-CM | POA: Diagnosis not present

## 2022-09-25 DIAGNOSIS — J189 Pneumonia, unspecified organism: Secondary | ICD-10-CM

## 2022-09-25 LAB — HEPATIC FUNCTION PANEL
ALT: 74 U/L — ABNORMAL HIGH (ref 0–44)
AST: 25 U/L (ref 15–41)
Albumin: 1.6 g/dL — ABNORMAL LOW (ref 3.5–5.0)
Alkaline Phosphatase: 405 U/L — ABNORMAL HIGH (ref 38–126)
Bilirubin, Direct: <0.1 mg/dL (ref 0.0–0.2)
Total Bilirubin: 0.4 mg/dL (ref 0.3–1.2)
Total Protein: 4.7 g/dL — ABNORMAL LOW (ref 6.5–8.1)

## 2022-09-25 LAB — BASIC METABOLIC PANEL
Anion gap: 8 (ref 5–15)
Anion gap: 3 — ABNORMAL LOW (ref 5–15)
BUN: 26 mg/dL — ABNORMAL HIGH (ref 6–20)
BUN: 25 mg/dL — ABNORMAL HIGH (ref 6–20)
CO2: 17 mmol/L — ABNORMAL LOW (ref 22–32)
CO2: 17 mmol/L — ABNORMAL LOW (ref 22–32)
Calcium: 8 mg/dL — ABNORMAL LOW (ref 8.9–10.3)
Calcium: 7.3 mg/dL — ABNORMAL LOW (ref 8.9–10.3)
Chloride: 106 mmol/L (ref 98–111)
Chloride: 108 mmol/L (ref 98–111)
Creatinine, Ser: 1.29 mg/dL — ABNORMAL HIGH (ref 0.44–1.00)
Creatinine, Ser: 1.17 mg/dL — ABNORMAL HIGH (ref 0.44–1.00)
GFR, Estimated: >60 mL/min (ref >60)
GFR, Estimated: >60 mL/min (ref >60)
Glucose, Bld: 421 mg/dL — ABNORMAL HIGH (ref 70–99)
Glucose, Bld: 224 mg/dL — ABNORMAL HIGH (ref 70–99)
Potassium: 5.2 mmol/L — ABNORMAL HIGH (ref 3.5–5.1)
Potassium: 4.3 mmol/L (ref 3.5–5.1)
Sodium: 131 mmol/L — ABNORMAL LOW (ref 135–145)
Sodium: 128 mmol/L — ABNORMAL LOW (ref 135–145)

## 2022-09-25 LAB — PROTIME-INR
INR: 3.3 — ABNORMAL HIGH (ref 0.8–1.2)
INR: 3.7 — ABNORMAL HIGH (ref 0.8–1.2)
Prothrombin Time: 33.6 seconds — ABNORMAL HIGH (ref 11.4–15.2)
Prothrombin Time: 36.1 seconds — ABNORMAL HIGH (ref 11.4–15.2)

## 2022-09-25 LAB — BLOOD GAS, VENOUS
Acid-base deficit: 10.4 mmol/L — ABNORMAL HIGH (ref 0.0–2.0)
Bicarbonate: 15.7 mmol/L — ABNORMAL LOW (ref 20.0–28.0)
O2 Saturation: 49.6 %
Patient temperature: 37
pCO2, Ven: 35 mmHg — ABNORMAL LOW (ref 44–60)
pH, Ven: 7.26 (ref 7.25–7.43)
pO2, Ven: 35 mmHg (ref 32–45)

## 2022-09-25 LAB — CBC
HCT: 41.7 % (ref 36.0–46.0)
Hemoglobin: 13.1 g/dL (ref 12.0–15.0)
MCH: 27.9 pg (ref 26.0–34.0)
MCHC: 31.4 g/dL (ref 30.0–36.0)
MCV: 88.7 fL (ref 80.0–100.0)
Platelets: 496 10*3/uL — ABNORMAL HIGH (ref 150–400)
RBC: 4.7 MIL/uL (ref 3.87–5.11)
RDW: 16.2 % — ABNORMAL HIGH (ref 11.5–15.5)
WBC: 12.2 10*3/uL — ABNORMAL HIGH (ref 4.0–10.5)
nRBC: 0 % (ref 0.0–0.2)

## 2022-09-25 LAB — TROPONIN I (HIGH SENSITIVITY)
Troponin I (High Sensitivity): 15 ng/L (ref <18)
Troponin I (High Sensitivity): 16 ng/L (ref <18)

## 2022-09-25 LAB — LIPASE, BLOOD: Lipase: 25 U/L (ref 11–51)

## 2022-09-25 LAB — BRAIN NATRIURETIC PEPTIDE: B Natriuretic Peptide: 645.3 pg/mL — ABNORMAL HIGH (ref 0.0–100.0)

## 2022-09-25 LAB — SARS CORONAVIRUS 2 BY RT PCR: SARS Coronavirus 2 by RT PCR: NEGATIVE

## 2022-09-25 MED ORDER — OXYCODONE HCL 5 MG PO TABS
5.0000 mg | ORAL_TABLET | Freq: Once | ORAL | Status: AC
  Administered 2022-09-25: 5 mg via ORAL
  Filled 2022-09-25: qty 1

## 2022-09-25 MED ORDER — INSULIN ASPART 100 UNIT/ML IJ SOLN
10.0000 [IU] | Freq: Once | INTRAMUSCULAR | Status: AC
  Administered 2022-09-25: 10 [IU] via INTRAVENOUS
  Filled 2022-09-25: qty 1

## 2022-09-25 MED ORDER — AZITHROMYCIN 250 MG PO TABS: ORAL_TABLET | ORAL | 0 refills | Status: AC

## 2022-09-25 MED ORDER — LACTATED RINGERS IV BOLUS
1000.0000 mL | Freq: Once | INTRAVENOUS | Status: AC
  Administered 2022-09-25: 1000 mL via INTRAVENOUS

## 2022-09-25 MED ORDER — LIDOCAINE 5 % EX PTCH
1.0000 | MEDICATED_PATCH | CUTANEOUS | Status: DC
  Administered 2022-09-25: 1 via TRANSDERMAL
  Filled 2022-09-25: qty 1

## 2022-09-25 MED ORDER — CEFPODOXIME PROXETIL 200 MG PO TABS: 200.0000 mg | ORAL_TABLET | Freq: Two times a day (BID) | ORAL | 0 refills | Status: AC

## 2022-09-25 MED ORDER — IOHEXOL 350 MG/ML SOLN
75.0000 mL | Freq: Once | INTRAVENOUS | Status: AC | PRN
  Administered 2022-09-25: 75 mL via INTRAVENOUS

## 2022-09-25 MED ORDER — KETOROLAC TROMETHAMINE 10 MG PO TABS: 10.0000 mg | ORAL_TABLET | Freq: Four times a day (QID) | ORAL | 0 refills | Status: DC | PRN

## 2022-09-25 MED ORDER — SODIUM ZIRCONIUM CYCLOSILICATE 10 G PO PACK
10.0000 g | PACK | Freq: Once | ORAL | Status: AC
  Administered 2022-09-25: 10 g via ORAL
  Filled 2022-09-25: qty 1

## 2022-09-25 NOTE — Inpatient Diabetes Management (Signed)
Inpatient Diabetes Program Recommendations  AACE/ADA: New Consensus Statement on Inpatient Glycemic Control (2015)  Target Ranges:  Prepandial:   less than 140 mg/dL      Peak postprandial:   less than 180 mg/dL (1-2 hours)      Critically ill patients:  140 - 180 mg/dL   Lab Results  Component Value Date   GLUCAP 164 (H) 09/05/2022   HGBA1C 9.1 (H) 08/25/2022    Review of Glycemic Control  Latest Reference Range & Units 09/25/22 11:59  Glucose 70 - 99 mg/dL 421 (H)   Diabetes history: DM  Outpatient Diabetes medications:  Lantus 30 units q HS, Novolog 5-8 units tid with meals  ** Novolog 1 units for every 8 carbs, Novolog 1 units for every 50 mg/dL >150 mg/dL [facility] endocrinology Current orders for Inpatient glycemic control:  None yet Inpatient Diabetes Program Recommendations:    Note high blood sugars.  Consider checking beta hydroxybutyric acid to assess for blood ketones?  If ketones are negative, consider adding Semglee 20 units daily and Novolog sensitive q 4 hours.  If ketones are present, may need DKA/IV insulin order set.    Thanks,  Adah Perl, RN, BC-ADM Inpatient Diabetes Coordinator Pager [phone]  (8a-5p)

## 2022-09-25 NOTE — Telephone Encounter (Signed)
Received an email reminder from patient's mother Vivia Budge, about FMLA form.  I completed it and emailed it to Dr. Genia Harold at the [facility] office for signature.

## 2022-09-25 NOTE — ED Triage Notes (Signed)
Pt sts that she has blood clots around her lungs. Pt sts that she is having back pain and SOB. Pt is on a blood thinner and recently got her INR checked which she said was 3.3.

## 2022-09-25 NOTE — Telephone Encounter (Signed)
Dr. Genia Harold signed the FMLA form and I faxed it to [facility] - fax# [phone].   Also emailed a copy to patient's mother at [email or web address].

## 2022-09-25 NOTE — ED Provider Notes (Signed)
[facility] Provider Note    Event Date/Time   First MD Initiated Contact with Patient 09/25/22 1205     (approximate)   History   Shortness of Breath   HPI  Catherine [name] is a 22 y.o. female with  minimal-change disease, nephrotic syndrome, PE who reports that her INR was recently 3.3 and has been having back pain and shortness of breath.  Patient reports that over the past 2 days she has had some right upper back pain that is worse with movement.  She reports having some associated worsening shortness of breath.  She reports be compliant with her medications.  She denies any falls hitting her head or any abdominal pain.  Patient was seen by myself on 3/20 her PET scan showed resolving PEs but she did report having some blood-tinged sputum so we discussed admission for consideration of bronchoscopy which she declined.  Physical Exam   Triage Vital Signs: ED Triage Vitals  Enc Vitals Group     BP 09/25/22 1153 99/65     Pulse Rate 09/25/22 1153 96     Resp 09/25/22 1153 (!) 24     Temp 09/25/22 1153 98 F (36.7 C)     Temp Source 09/25/22 1153 Oral     SpO2 09/25/22 1153 (!) 88 %     Weight 09/25/22 1155 115 lb (52.2 kg)     Height --      Head Circumference --      Peak Flow --      Pain Score 09/25/22 1155 10     Pain Loc --      Pain Edu? --      Excl. in [place]? --     Most recent vital signs: Vitals:   09/25/22 1153 09/25/22 1157  BP: 99/65   Pulse: 96   Resp: (!) 24   Temp: 98 F (36.7 C)   SpO2: (!) 88% 97%     General: Awake, no distress.  CV:  Good peripheral perfusion.  Resp:  Normal effort.  Abd:  No distention.  Other:  Patient has some right upper back pain with palpation.  She has got no redness or warmth of the joint.  Clear lungs.  Abdomen soft nontender   ED Results / Procedures / Treatments   Labs (all labs ordered are listed, but only abnormal results are displayed) Labs Reviewed  BASIC METABOLIC PANEL   CBC  PROTIME-INR  TROPONIN I (HIGH SENSITIVITY)     EKG  My interpretation of EKG:  Normal sinus rate 96 that any ST elevation, T wave version V3, no right axis deviation  RADIOLOGY I have reviewed the xray personally and interpreted and some cavitary lesion noted on the right  PROCEDURES:  Critical Care performed: No  .1-3 Lead EKG Interpretation  Performed by: Vanessa [place], MD Authorized by: Vanessa [place], MD     Interpretation: normal     ECG rate:  90   ECG rate assessment: normal     Rhythm: sinus rhythm     Ectopy: none     Conduction: normal   .Critical Care  Performed by: Vanessa [place], MD Authorized by: Vanessa [place], MD   Critical care provider statement:    Critical care time (minutes):  30   Critical care was necessary to treat or prevent imminent or life-threatening deterioration of the following conditions:  Endocrine crisis   Critical care was time spent personally by me on  the following activities:  Development of treatment plan with patient or surrogate, discussions with consultants, evaluation of patient's response to treatment, examination of patient, ordering and review of laboratory studies, ordering and review of radiographic studies, ordering and performing treatments and interventions, pulse oximetry, re-evaluation of patient's condition and review of old [place] ED: Medications  lidocaine (LIDODERM) 5 % 1 patch (1 patch Transdermal Patch Applied 09/25/22 1305)  sodium zirconium cyclosilicate (LOKELMA) packet 10 g (has no administration in time range)  oxyCODONE (Oxy IR/ROXICODONE) immediate release tablet 5 mg (5 mg Oral Given 09/25/22 1306)  insulin aspart (novoLOG) injection 10 Units (10 Units Intravenous Given 09/25/22 1444)  lactated ringers bolus 1,000 mL (1,000 mLs Intravenous New Bag/Given 09/25/22 1446)  iohexol (OMNIPAQUE) 350 MG/ML injection 75 mL (75 mLs Intravenous Contrast Given 09/25/22 1501)      IMPRESSION / MDM / [place] / ED COURSE  I reviewed the triage vital signs and the nursing notes.   Patient's presentation is most consistent with acute presentation with potential threat to life or bodily function.   Patient comes in for right upper back pain that seems more musculoskeletal in nature but given her significant history concerning for recurrent PE, pneumothorax, abscess infection or other acute pathology.    VBG shows normal pH.  BMP slightly elevated potassium at 5.2 with a creatinine of 1.29.  Her glucose is 421 with a low bicarb will give a dose of IV insulin.  Initial troponin is negative.  INR therapeutic.  Do not feel that this represents DKA given normal anion gap  Give patient some IV fluids, IV insulin and Lokelma for her hyperkalemia.  Reevaluated patient she reports the pain is still there but improved.  She is adamant that this is how she presented when she had her first PE.  Discussed with her that my suspicion for recurrent PE is low given therapeutic INR not sure this could be related to the necrotic mass that is been noted on CT imaging.  We discussed pros and cons of repeat CT imaging given her age and she would like to proceed and understands risk for contrast to kidney/cancer-- we are testing for COVID, flu.  I will discuss with the pulmonology team issues follow with Dr. Mortimer Fries  Discussed with Dr. Mortimer Fries not feel that patient needs a bronchoscopy given the blood-tinged sputum a few days ago and the worsening right-sided back pain.  If CT imaging is similar to prior and this pain is most likely related to the necrotic mass or the PE.  She handed off to oncoming team sending CT PE as well as repeat BMP, troponin, urine-patient will need repeat vitals.  She has not been hypoxic since I initially saw her I took her off the oxygen.  I am not sure if the 88% in triage was a true reading will get amatory sat to make sure she is not hypoxic prior to  discharge  The patient is on the cardiac monitor to evaluate for evidence of arrhythmia and/or significant heart rate changes.     FINAL CLINICAL IMPRESSION(S) / ED DIAGNOSES   Final diagnoses:  Hyperglycemia  Hyperkalemia  Pain of right scapula     Rx / DC Orders   ED Discharge Orders     None        Note:  This document was prepared using Dragon voice recognition software and may include unintentional dictation errors.   Vanessa [place], MD 09/25/22  1526  

## 2022-09-28 ENCOUNTER — Inpatient Hospital Stay: Payer: Medicaid Other

## 2022-09-28 ENCOUNTER — Other Ambulatory Visit: Payer: Self-pay | Admitting: Oncology

## 2022-09-28 DIAGNOSIS — I2699 Other pulmonary embolism without acute cor pulmonale: Secondary | ICD-10-CM | POA: Diagnosis not present

## 2022-09-28 DIAGNOSIS — I2609 Other pulmonary embolism with acute cor pulmonale: Secondary | ICD-10-CM

## 2022-09-28 LAB — PROTIME-INR
INR: 2.1 — ABNORMAL HIGH (ref 0.8–1.2)
Prothrombin Time: 23.6 seconds — ABNORMAL HIGH (ref 11.4–15.2)

## 2022-09-28 MED ORDER — WARFARIN SODIUM 1 MG PO TABS: 1.0000 mg | ORAL_TABLET | Freq: Every day | ORAL | 1 refills | Status: DC

## 2022-09-28 MED ORDER — WARFARIN SODIUM 2 MG PO TABS: 4.0000 mg | ORAL_TABLET | Freq: Every day | ORAL | 0 refills | Status: DC

## 2022-09-28 NOTE — Telephone Encounter (Signed)
Please schedule pt for labs on thurs 4/4 @ 8am.

## 2022-09-30 DIAGNOSIS — Z0289 Encounter for other administrative examinations: Secondary | ICD-10-CM

## 2022-10-05 ENCOUNTER — Inpatient Hospital Stay: Payer: Medicaid Other | Attending: Oncology

## 2022-10-05 ENCOUNTER — Telehealth: Payer: Self-pay

## 2022-10-05 ENCOUNTER — Other Ambulatory Visit: Payer: Self-pay | Admitting: Oncology

## 2022-10-05 DIAGNOSIS — I2699 Other pulmonary embolism without acute cor pulmonale: Secondary | ICD-10-CM | POA: Diagnosis present

## 2022-10-05 DIAGNOSIS — I2609 Other pulmonary embolism with acute cor pulmonale: Secondary | ICD-10-CM

## 2022-10-05 DIAGNOSIS — Z7901 Long term (current) use of anticoagulants: Secondary | ICD-10-CM | POA: Diagnosis not present

## 2022-10-05 LAB — PROTIME-INR
INR: 2.2 — ABNORMAL HIGH (ref 0.8–1.2)
Prothrombin Time: 24.3 seconds — ABNORMAL HIGH (ref 11.4–15.2)

## 2022-10-05 MED ORDER — WARFARIN SODIUM 5 MG PO TABS: 5.0000 mg | ORAL_TABLET | Freq: Every day | ORAL | 2 refills | Status: DC

## 2022-10-05 NOTE — Telephone Encounter (Signed)
-----   Message from Earlie Server, MD sent at 10/05/2022  8:31 AM EDT ----- Recommend her to continue 5mg  of coumadin.

## 2022-10-05 NOTE — Telephone Encounter (Signed)
Mychart message sent to pt. Please schedule Lab (INR check) on 4/11 @ 8am.

## 2022-10-12 ENCOUNTER — Telehealth: Payer: Self-pay

## 2022-10-12 ENCOUNTER — Emergency Department: Payer: Medicaid Other

## 2022-10-12 ENCOUNTER — Other Ambulatory Visit: Payer: Self-pay

## 2022-10-12 ENCOUNTER — Emergency Department
Admission: EM | Admit: 2022-10-12 | Discharge: 2022-10-12 | Disposition: A | Discharge status: Home / Self Care | Payer: Medicaid Other | Attending: Emergency Medicine | Admitting: Emergency Medicine

## 2022-10-12 ENCOUNTER — Inpatient Hospital Stay: Payer: Medicaid Other

## 2022-10-12 DIAGNOSIS — I10 Essential (primary) hypertension: Secondary | ICD-10-CM | POA: Insufficient documentation

## 2022-10-12 DIAGNOSIS — I2609 Other pulmonary embolism with acute cor pulmonale: Secondary | ICD-10-CM

## 2022-10-12 DIAGNOSIS — M7989 Other specified soft tissue disorders: Secondary | ICD-10-CM | POA: Insufficient documentation

## 2022-10-12 DIAGNOSIS — E1065 Type 1 diabetes mellitus with hyperglycemia: Secondary | ICD-10-CM | POA: Diagnosis not present

## 2022-10-12 DIAGNOSIS — Z79899 Other long term (current) drug therapy: Secondary | ICD-10-CM | POA: Insufficient documentation

## 2022-10-12 DIAGNOSIS — Z1152 Encounter for screening for COVID-19: Secondary | ICD-10-CM | POA: Diagnosis not present

## 2022-10-12 DIAGNOSIS — Z7901 Long term (current) use of anticoagulants: Secondary | ICD-10-CM | POA: Insufficient documentation

## 2022-10-12 DIAGNOSIS — R600 Localized edema: Secondary | ICD-10-CM

## 2022-10-12 DIAGNOSIS — I2699 Other pulmonary embolism without acute cor pulmonale: Secondary | ICD-10-CM | POA: Diagnosis not present

## 2022-10-12 DIAGNOSIS — R0789 Other chest pain: Secondary | ICD-10-CM | POA: Insufficient documentation

## 2022-10-12 LAB — CBC
HCT: 42 % (ref 36.0–46.0)
Hemoglobin: 13.2 g/dL (ref 12.0–15.0)
MCH: 26.6 pg (ref 26.0–34.0)
MCHC: 31.4 g/dL (ref 30.0–36.0)
MCV: 84.7 fL (ref 80.0–100.0)
Platelets: 533 10*3/uL — ABNORMAL HIGH (ref 150–400)
RBC: 4.96 MIL/uL (ref 3.87–5.11)
RDW: 15.5 % (ref 11.5–15.5)
WBC: 17.1 10*3/uL — ABNORMAL HIGH (ref 4.0–10.5)
nRBC: 0 % (ref 0.0–0.2)

## 2022-10-12 LAB — COMPREHENSIVE METABOLIC PANEL
ALT: 11 U/L (ref 0–44)
AST: 18 U/L (ref 15–41)
Albumin: <1.5 g/dL — ABNORMAL LOW (ref 3.5–5.0)
Alkaline Phosphatase: 113 U/L (ref 38–126)
Anion gap: 7 (ref 5–15)
BUN: 48 mg/dL — ABNORMAL HIGH (ref 6–20)
CO2: 17 mmol/L — ABNORMAL LOW (ref 22–32)
Calcium: 7.4 mg/dL — ABNORMAL LOW (ref 8.9–10.3)
Chloride: 109 mmol/L (ref 98–111)
Creatinine, Ser: 1.44 mg/dL — ABNORMAL HIGH (ref 0.44–1.00)
GFR, Estimated: 53 mL/min — ABNORMAL LOW (ref >60)
Glucose, Bld: 185 mg/dL — ABNORMAL HIGH (ref 70–99)
Potassium: 3.6 mmol/L (ref 3.5–5.1)
Sodium: 133 mmol/L — ABNORMAL LOW (ref 135–145)
Total Bilirubin: 0.4 mg/dL (ref 0.3–1.2)
Total Protein: 4.2 g/dL — ABNORMAL LOW (ref 6.5–8.1)

## 2022-10-12 LAB — PROTIME-INR
INR: 1.8 — ABNORMAL HIGH (ref 0.8–1.2)
Prothrombin Time: 20.6 seconds — ABNORMAL HIGH (ref 11.4–15.2)

## 2022-10-12 LAB — BRAIN NATRIURETIC PEPTIDE: B Natriuretic Peptide: 966.2 pg/mL — ABNORMAL HIGH (ref 0.0–100.0)

## 2022-10-12 LAB — SARS CORONAVIRUS 2 BY RT PCR: SARS Coronavirus 2 by RT PCR: NEGATIVE

## 2022-10-12 LAB — PROCALCITONIN: Procalcitonin: <0.1 ng/mL

## 2022-10-12 MED ORDER — OXYCODONE-ACETAMINOPHEN 5-325 MG PO TABS
1.0000 | ORAL_TABLET | Freq: Once | ORAL | Status: AC
  Administered 2022-10-12: 1 via ORAL
  Filled 2022-10-12: qty 1

## 2022-10-12 MED ORDER — OXYCODONE HCL 5 MG PO TABS: 5.0000 mg | ORAL_TABLET | Freq: Three times a day (TID) | ORAL | 0 refills | Status: DC | PRN

## 2022-10-12 MED ORDER — FUROSEMIDE 10 MG/ML IJ SOLN
40.0000 mg | Freq: Once | INTRAMUSCULAR | Status: AC
  Administered 2022-10-12: 40 mg via INTRAVENOUS
  Filled 2022-10-12: qty 4

## 2022-10-12 MED ORDER — IOHEXOL 300 MG/ML  SOLN
75.0000 mL | Freq: Once | INTRAMUSCULAR | Status: AC | PRN
  Administered 2022-10-12: 75 mL via INTRAVENOUS

## 2022-10-12 MED ORDER — TORSEMIDE 20 MG PO TABS: 20.0000 mg | ORAL_TABLET | Freq: Every day | ORAL | 0 refills | Status: DC

## 2022-10-12 NOTE — ED Triage Notes (Signed)
Pt to ED for bilateral leg swelling  since Monday. States has kidney disease, nephrotic syndrome. Takes torsemide. Also reports decreased urine output Reports has a current PE

## 2022-10-12 NOTE — Telephone Encounter (Signed)
-----   Message from Rickard Patience, MD sent at 10/12/2022  9:01 AM EDT ----- Please recommend patient to take 6mg  alternate with 5mg  of coumadin Repeat INR in 1 week thanks.  If she needs 1mg  tablets let me know.  zy

## 2022-10-12 NOTE — Telephone Encounter (Signed)
I called and left message with patient and sent MyChart message to inform patient that Dr. Cathie Hoops recommends she take Coumadin 5mg  alternating with 6mg . I also informed her that Dr. Cathie Hoops would like to repeat INR in one week and I will have scheduling make this appointment and notify her of date and time.

## 2022-10-12 NOTE — ED Provider Notes (Signed)
[facility] Provider Note    Event Date/Time   First MD Initiated Contact with Patient 10/12/22 [phone]     (approximate)   History   Leg Swelling   HPI  Catherine [name] is a 22 y.o. 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  Patient tells me that she chronically has swelling but it has been worse since Monday, over the last 4 days.  She has chronic dyspnea but this is unchanged today.  Denies fevers or chills.  Has been trying to elevate the legs.  Takes torsemide and has been compliant.  She is on Coumadin for PE.  Tells me her INR was 1.8 this morning Dr. Bethanne Ginger office called her to make adjustments to her Coumadin dose.     Past Medical History:  Diagnosis Date   Diabetes mellitus without complication (HCC)    Dyslipidemia    Hypoalbuminemia    Marijuana abuse    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   Nephrotic syndrome 08/23/2019   Tobacco dependence     Patient Active Problem List   Diagnosis Date Noted   Pulmonary infarction 09/05/2022   History of nephrotic syndrome 09/02/2022   Hypoglycemia 09/02/2022   Acute hypoxic respiratory failure 09/01/2022   Pulmonary embolism 08/30/2022   Cavitary lesion of lung 08/30/2022   Leukocytosis 08/30/2022   Pulmonary embolism with acute cor pulmonale 08/22/2022   Hypoalbuminemia 08/22/2022   Marijuana abuse 08/22/2022   Tobacco dependence 08/22/2022   Abdominal pain 09/26/2021   Hypokalemia 09/26/2021   Dyslipidemia 09/26/2021   HCAP (healthcare-associated pneumonia) 09/26/2021   Severe sepsis with septic shock (CODE) 09/26/2021   Hypomagnesemia 09/26/2021   Pseudohyponatremia 09/04/2021   HTN (hypertension) 02/10/2021   DKA (diabetic ketoacidosis) 04/18/2020   Type 1 diabetes mellitus with other specified complication 08/27/2019    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   Proteinuria 08/23/2019   Nephrotic syndrome 08/23/2019   DM (diabetes mellitus) type I uncontrolled with renal manifestation 08/23/2019   AKI (acute kidney injury) 08/21/2019   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    Hyponatremia    Acidosis    Type 1 diabetes mellitus with hyperglycemia 10/12/2005     Physical Exam  Triage Vital Signs: ED Triage Vitals  Enc Vitals Group     BP 10/12/22 0838 109/75     Pulse Rate 10/12/22 0838 89     Resp 10/12/22 0838 18     Temp 10/12/22 0838 98.2 F (36.8 C)     Temp Source 10/12/22 0838 Oral     SpO2 10/12/22 0838 97 %     Weight 10/12/22 0840 120 lb (54.4 kg)     Height 10/12/22 0840 4\' 11"  (1.499 m)     Head Circumference --      Peak Flow --      Pain Score 10/12/22 0843 5     Pain Loc --      Pain Edu? --      Excl. in GC? --     Most recent vital signs: Vitals:   10/12/22 0838 10/12/22 1300  BP: 109/75 97/68  Pulse: 89 85  Resp: 18 16  Temp: 98.2 F (36.8 C)   SpO2: 97% 100%     General: Awake, no distress.  CV:  Good peripheral perfusion.  Resp:  Normal effort.  Lung sounds with some crackles at bilateral bases Abd:  No distention.  Soft nontender throughout Neuro:             Awake, Alert, Oriented x 3  Other:  Patient has pitting edema 3+ in bilateral lower extremities   ED Results / Procedures / Treatments  Labs (all labs ordered are listed, but only abnormal results are displayed) Labs Reviewed  CBC - Abnormal; Notable for the following components:      Result Value   WBC 17.1 (*)    Platelets 533 (*)    All other components within normal limits  COMPREHENSIVE METABOLIC PANEL - Abnormal; Notable for the following components:   Sodium 133 (*)    CO2 17 (*)    Glucose, Bld 185 (*)    BUN 48 (*)    Creatinine, Ser 1.44 (*)    Calcium 7.4 (*)    Total Protein 4.2 (*)    Albumin <1.5 (*)    GFR, Estimated 53 (*)     All other components within normal limits  BRAIN NATRIURETIC PEPTIDE - Abnormal; Notable for the following components:   B Natriuretic Peptide 966.2 (*)    All other components within normal limits  SARS CORONAVIRUS 2 BY RT PCR  PROCALCITONIN  POC URINE PREG, ED     EKG     RADIOLOGY Chest x-ray reviewed interpreted myself shows worsening right-sided opacities   PROCEDURES:  Critical Care performed: No  Procedures    MEDICATIONS ORDERED IN ED: Medications  furosemide (LASIX) injection 40 mg (40 mg Intravenous Given 10/12/22 1106)  oxyCODONE-acetaminophen (PERCOCET/ROXICET) 5-325 MG per tablet 1 tablet (1 tablet Oral Given 10/12/22 1106)  iohexol (OMNIPAQUE) 300 MG/ML solution 75 mL (75 mLs Intravenous Contrast Given 10/12/22 1110)     IMPRESSION / MDM / ASSESSMENT AND PLAN / ED COURSE  I reviewed the triage vital signs and the nursing notes.                              Patient's presentation is most consistent with acute complicated illness / injury requiring diagnostic workup.  Differential diagnosis includes, but is not limited to, exacerbation of minimal-change disease, renal failure, CHF,  23 year old female who is chronically ill with minimal-change disease on chronic steroids and tacrolimus who presents with increasing lower extremity swelling.  She is no increasing dyspnea has been compliant with her torsemide and tried elevating her legs.  She is having discomfort related to the pain.  It is bilateral and symmetric.  Vital signs are within normal limits no hypertension hypotension normal pulse ox.  She does have significant pitting edema and does have generalized anasarca.  I see her weight is up to 119 whereas when she saw nephrology last it was around 114.  I do hear some crackles at the bases of her chest will obtain chest x-ray.  Renal function is near baseline albumin chronically low less than 1.5.  Will give a dose of IV torsemide but anticipate she will be  able to follow-up with nephrology as an outpatient.  Patient's chest x-ray does show worsening right-sided opacities.  She has been followed by pulmonologist for chronic cavitary lesions.  Did obtain a CT of the chest with contrast to further evaluate for the underlying etiology.  Does show possible pulmonary edema and some of the cavitary lesions have worsened.  I did discuss with Dr. Nicanor Bake does not feel the patient needs further  workup for this at this time.  He has arranged for outpatient follow-up with pulmonology.  Patient is not hypoxic and from a respiratory standpoint does not have new symptoms today so I think this is appropriate.  Given the possible pulmonary edema increased lower extremity swelling I did recommend she double the dose of torsemide over the next 3 days and monitor her weight.  She has follow-up with nephrology next week.       FINAL CLINICAL IMPRESSION(S) / ED DIAGNOSES   Final diagnoses:  None     Rx / DC Orders   ED Discharge Orders          Ordered    torsemide (DEMADEX) 20 MG tablet  Daily        10/12/22 1318    oxyCODONE (ROXICODONE) 5 MG immediate release tablet  Every 8 hours PRN        10/12/22 1318             Note:  This document was prepared using Dragon voice recognition software and may include unintentional dictation errors.   Georga HackingMcHugh, Nelta Caudill Rose, MD 10/12/22 1626

## 2022-10-12 NOTE — Discharge Instructions (Signed)
Please increase your torsemide to twice a day for the next 3 days.  Please keep an eye on your weight.  Please follow-up with the pulmonologist.  You should receive a call from [facility] pulmonology to schedule an appointment.

## 2022-10-13 ENCOUNTER — Encounter: Payer: Self-pay | Admitting: Emergency Medicine

## 2022-10-13 ENCOUNTER — Inpatient Hospital Stay
Admission: EM | Admit: 2022-10-13 | Discharge: 2022-10-18 | DRG: 871 | Disposition: A | Discharge status: Other Acute Inpatient Hospital | Payer: Medicaid Other | Attending: Student | Admitting: Student

## 2022-10-13 ENCOUNTER — Emergency Department: Payer: Medicaid Other

## 2022-10-13 DIAGNOSIS — J810 Acute pulmonary edema: Secondary | ICD-10-CM

## 2022-10-13 DIAGNOSIS — N2581 Secondary hyperparathyroidism of renal origin: Secondary | ICD-10-CM | POA: Diagnosis present

## 2022-10-13 DIAGNOSIS — N179 Acute kidney failure, unspecified: Secondary | ICD-10-CM | POA: Diagnosis present

## 2022-10-13 DIAGNOSIS — Z87891 Personal history of nicotine dependence: Secondary | ICD-10-CM

## 2022-10-13 DIAGNOSIS — I3139 Other pericardial effusion (noninflammatory): Secondary | ICD-10-CM | POA: Diagnosis present

## 2022-10-13 DIAGNOSIS — E559 Vitamin D deficiency, unspecified: Secondary | ICD-10-CM | POA: Diagnosis not present

## 2022-10-13 DIAGNOSIS — N1831 Chronic kidney disease, stage 3a: Secondary | ICD-10-CM | POA: Diagnosis present

## 2022-10-13 DIAGNOSIS — I071 Rheumatic tricuspid insufficiency: Secondary | ICD-10-CM | POA: Diagnosis present

## 2022-10-13 DIAGNOSIS — E1065 Type 1 diabetes mellitus with hyperglycemia: Secondary | ICD-10-CM | POA: Diagnosis present

## 2022-10-13 DIAGNOSIS — Z79899 Other long term (current) drug therapy: Secondary | ICD-10-CM

## 2022-10-13 DIAGNOSIS — R6521 Severe sepsis with septic shock: Secondary | ICD-10-CM | POA: Diagnosis present

## 2022-10-13 DIAGNOSIS — I2699 Other pulmonary embolism without acute cor pulmonale: Secondary | ICD-10-CM | POA: Diagnosis present

## 2022-10-13 DIAGNOSIS — I959 Hypotension, unspecified: Secondary | ICD-10-CM | POA: Diagnosis present

## 2022-10-13 DIAGNOSIS — Z79621 Long term (current) use of calcineurin inhibitor: Secondary | ICD-10-CM

## 2022-10-13 DIAGNOSIS — E1069 Type 1 diabetes mellitus with other specified complication: Secondary | ICD-10-CM

## 2022-10-13 DIAGNOSIS — Z794 Long term (current) use of insulin: Secondary | ICD-10-CM

## 2022-10-13 DIAGNOSIS — E1022 Type 1 diabetes mellitus with diabetic chronic kidney disease: Secondary | ICD-10-CM | POA: Diagnosis present

## 2022-10-13 DIAGNOSIS — E8779 Other fluid overload: Secondary | ICD-10-CM | POA: Diagnosis present

## 2022-10-13 DIAGNOSIS — I272 Pulmonary hypertension, unspecified: Secondary | ICD-10-CM

## 2022-10-13 DIAGNOSIS — Z91199 Patient's noncompliance with other medical treatment and regimen due to unspecified reason: Secondary | ICD-10-CM

## 2022-10-13 DIAGNOSIS — N2889 Other specified disorders of kidney and ureter: Secondary | ICD-10-CM | POA: Diagnosis present

## 2022-10-13 DIAGNOSIS — I129 Hypertensive chronic kidney disease with stage 1 through stage 4 chronic kidney disease, or unspecified chronic kidney disease: Secondary | ICD-10-CM | POA: Diagnosis present

## 2022-10-13 DIAGNOSIS — D84821 Immunodeficiency due to drugs: Secondary | ICD-10-CM | POA: Diagnosis present

## 2022-10-13 DIAGNOSIS — E8809 Other disorders of plasma-protein metabolism, not elsewhere classified: Secondary | ICD-10-CM | POA: Diagnosis present

## 2022-10-13 DIAGNOSIS — D631 Anemia in chronic kidney disease: Secondary | ICD-10-CM | POA: Diagnosis present

## 2022-10-13 DIAGNOSIS — T451X5A Adverse effect of antineoplastic and immunosuppressive drugs, initial encounter: Secondary | ICD-10-CM | POA: Diagnosis present

## 2022-10-13 DIAGNOSIS — J9601 Acute respiratory failure with hypoxia: Secondary | ICD-10-CM | POA: Diagnosis present

## 2022-10-13 DIAGNOSIS — T380X5A Adverse effect of glucocorticoids and synthetic analogues, initial encounter: Secondary | ICD-10-CM | POA: Diagnosis not present

## 2022-10-13 DIAGNOSIS — I2724 Chronic thromboembolic pulmonary hypertension: Secondary | ICD-10-CM

## 2022-10-13 DIAGNOSIS — T503X5A Adverse effect of electrolytic, caloric and water-balance agents, initial encounter: Secondary | ICD-10-CM | POA: Diagnosis present

## 2022-10-13 DIAGNOSIS — R042 Hemoptysis: Secondary | ICD-10-CM | POA: Diagnosis present

## 2022-10-13 DIAGNOSIS — J189 Pneumonia, unspecified organism: Secondary | ICD-10-CM | POA: Diagnosis present

## 2022-10-13 DIAGNOSIS — Z7901 Long term (current) use of anticoagulants: Secondary | ICD-10-CM

## 2022-10-13 DIAGNOSIS — Z1152 Encounter for screening for COVID-19: Secondary | ICD-10-CM

## 2022-10-13 DIAGNOSIS — A419 Sepsis, unspecified organism: Principal | ICD-10-CM | POA: Diagnosis present

## 2022-10-13 DIAGNOSIS — I371 Nonrheumatic pulmonary valve insufficiency: Secondary | ICD-10-CM | POA: Diagnosis present

## 2022-10-13 DIAGNOSIS — E785 Hyperlipidemia, unspecified: Secondary | ICD-10-CM | POA: Diagnosis present

## 2022-10-13 LAB — CBC WITH DIFFERENTIAL/PLATELET
Abs Immature Granulocytes: 0.27 10*3/uL — ABNORMAL HIGH (ref 0.00–0.07)
Basophils Absolute: 0.1 10*3/uL (ref 0.0–0.1)
Basophils Relative: 0 %
Eosinophils Absolute: 0.1 10*3/uL (ref 0.0–0.5)
Eosinophils Relative: 0 %
HCT: 40.6 % (ref 36.0–46.0)
Hemoglobin: 13 g/dL (ref 12.0–15.0)
Immature Granulocytes: 1 %
Lymphocytes Relative: 22 %
Lymphs Abs: 4.2 10*3/uL — ABNORMAL HIGH (ref 0.7–4.0)
MCH: 27 pg (ref 26.0–34.0)
MCHC: 32 g/dL (ref 30.0–36.0)
MCV: 84.4 fL (ref 80.0–100.0)
Monocytes Absolute: 1.6 10*3/uL — ABNORMAL HIGH (ref 0.1–1.0)
Monocytes Relative: 9 %
Neutro Abs: 12.8 10*3/uL — ABNORMAL HIGH (ref 1.7–7.7)
Neutrophils Relative %: 68 %
Platelets: 349 10*3/uL (ref 150–400)
RBC: 4.81 MIL/uL (ref 3.87–5.11)
RDW: 15.4 % (ref 11.5–15.5)
WBC: 19 10*3/uL — ABNORMAL HIGH (ref 4.0–10.5)
nRBC: 0 % (ref 0.0–0.2)

## 2022-10-13 LAB — PROTIME-INR
INR: 1.5 — ABNORMAL HIGH (ref 0.8–1.2)
Prothrombin Time: 17.9 seconds — ABNORMAL HIGH (ref 11.4–15.2)

## 2022-10-13 LAB — CBG MONITORING, ED: Glucose-Capillary: 216 mg/dL — ABNORMAL HIGH (ref 70–99)

## 2022-10-13 MED ORDER — SODIUM CHLORIDE 0.9 % IV BOLUS (SEPSIS)
1000.0000 mL | Freq: Once | INTRAVENOUS | Status: AC
  Administered 2022-10-13: 1000 mL via INTRAVENOUS

## 2022-10-13 MED ORDER — FENTANYL CITRATE PF 50 MCG/ML IJ SOSY
50.0000 ug | PREFILLED_SYRINGE | Freq: Once | INTRAMUSCULAR | Status: AC
  Administered 2022-10-13: 50 ug via INTRAVENOUS
  Filled 2022-10-13: qty 1

## 2022-10-13 MED ORDER — MECLIZINE HCL 25 MG PO TABS
25.0000 mg | ORAL_TABLET | Freq: Once | ORAL | Status: AC
  Administered 2022-10-13: 25 mg via ORAL
  Filled 2022-10-13: qty 1

## 2022-10-13 MED ORDER — ONDANSETRON HCL 4 MG/2ML IJ SOLN
4.0000 mg | Freq: Once | INTRAMUSCULAR | Status: AC
  Administered 2022-10-13: 4 mg via INTRAVENOUS
  Filled 2022-10-13: qty 2

## 2022-10-13 NOTE — ED Provider Notes (Signed)
[facility] Provider Note    Event Date/Time   First MD Initiated Contact with Patient 10/13/22 2301     (approximate)   History   Dizziness   HPI  Catherine [name] is a 22 y.o. 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  States she has had some vomiting and has seen streaks of blood in her vomit.  No diarrhea.  No bloody stools or melena.  No abdominal pain.  She does feel like her chest pain and shortness of breath feels similar to her previous episodes of PE.  She was seen in the emergency department 2 days ago for volume overload and worsening right-sided opacities.  Was given IV torsemide and and was instructed to follow-up as an outpatient.  They recommended time that she increase her torsemide over the next 3 days.  Patient is hypotensive here with systolic blood pressures in the 70s to 80s.   History provided by patient, friend.    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 Authorizing Provider  acetaminophen (TYLENOL) 325 MG tablet  Take 2 tablets (650 mg total) by mouth every 6 (six) hours as needed for mild pain or headache. 09/05/22   Loyce Dys, MD  benzonatate (TESSALON PERLES) 100 MG capsule Take 1 capsule (100 mg total) by mouth 3 (three) times daily as needed for cough. 08/30/22   Loleta Rose, MD  benzonatate (TESSALON) 200 MG capsule Take 1 capsule (200 mg total) by mouth 3 (three) times daily as needed for cough. 09/05/22   Loyce Dys, MD  blood glucose meter kit and supplies Dispense based on patient and insurance preference. Use up to four times daily as directed. (FOR ICD-10 E10.9, E11.9). 09/06/21   Darlin Priestly, MD  chlorpheniramine-HYDROcodone (TUSSIONEX) 10-8 MG/5ML Take 5 mLs by mouth every 12 (twelve) hours as needed for cough. Patient not taking: Reported on 09/12/2022 08/30/22   Loleta Rose, MD  enoxaparin (LOVENOX) 60 MG/0.6ML injection Inject 0.575 mLs (57.5 mg total) into the skin every 12 (twelve) hours. 09/18/22   Rickard Patience, MD  feeding supplement, GLUCERNA SHAKE, (GLUCERNA SHAKE) LIQD Take 237 mLs by mouth 2 (two) times daily between meals. 08/25/22   Darlin Priestly, MD  glucagon 1 MG injection Inject 1 mg into the muscle as needed. 02/26/19   [provider]  insulin aspart (NOVOLOG) 100 UNIT/ML injection Inject 5-8 Units into the skin 3 (three) times daily before meals. 09/06/21 09/08/22  Darlin Priestly, MD  ketorolac (TORADOL) 10 MG tablet Take 1 tablet (10 mg total) by mouth every 6 (six) hours as needed. 09/25/22   Merwyn Katos, MD  LANTUS 100 UNIT/ML injection Inject 0.15 mLs (15 Units total) into the skin at bedtime. 09/05/22 12/04/22  Loyce Dys, MD  Multiple Vitamin (MULTIVITAMIN WITH MINERALS) TABS tablet Take 1 tablet by mouth daily. 08/25/22   Darlin Priestly, MD  oxyCODONE (ROXICODONE) 5 MG immediate release tablet Take 1 tablet (5 mg total) by mouth every 8 (eight) hours as needed for up to 2 days. 10/12/22 10/14/22  Georga Hacking, MD  sulfamethoxazole-trimethoprim (BACTRIM DS) 800-160 MG tablet Take  1 tablet by mouth every Monday, Wednesday, and Friday. 08/25/22 11/23/22  Darlin Priestly, MD  tacrolimus (PROGRAF) 1 MG capsule Take 2 capsules (2 mg total) by mouth 2 (two) times daily. 08/25/22 11/23/22  Darlin Priestly, MD  torsemide (DEMADEX) 20 MG tablet Take 1 tablet (20 mg total) by mouth daily. 08/25/22 09/24/22  Darlin Priestly, MD  torsemide (DEMADEX) 20 MG tablet Take 1 tablet (20 mg total) by mouth daily. 10/12/22 11/11/22  Georga Hacking, MD  Vitamin D, Ergocalciferol, (DRISDOL) 1.25 MG (50000 UNIT) CAPS capsule Take 50,000 Units by mouth every 7 (seven) days. 07/10/22 07/10/23  [provider]  warfarin (COUMADIN) 5 MG tablet Take 1 tablet (5 mg total) by mouth daily. 10/05/22   Rickard Patience, MD    Physical Exam   Triage Vital Signs: ED Triage Vitals  Enc Vitals Group     BP 10/13/22 2253 (!) 94/53     Pulse Rate 10/13/22 2253 86     Resp 10/13/22 2253 (!) 22     Temp 10/13/22 2253 98 F (36.7 C)     Temp Source 10/13/22 2253 Oral     SpO2 10/13/22 2253 100 %     Weight 10/13/22 2257 120 lb 2.4 oz (54.5 kg)     Height 10/13/22 2257  (1.499 m)     Head Circumference --      Peak Flow --      Pain Score 10/13/22 2251 10     Pain Loc --      Pain Edu? --      Excl. in GC? --     Most recent vital signs: Vitals:   10/14/22 0315 10/14/22 0345  BP: 93/72 96/64  Pulse: (!) 107 96  Resp:    Temp:    SpO2: 97% 100%    CONSTITUTIONAL: Alert, responds appropriately to questions.  Medically ill-appearing HEAD: Normocephalic, atraumatic EYES: Conjunctivae clear, pupils appear equal, sclera nonicteric ENT: normal nose; moist mucous membranes NECK: Supple, normal ROM CARD: RRR; S1 and S2 appreciated RESP: Normal chest excursion without splinting or tachypnea; breath sounds clear and equal bilaterally; no wheezes, no rhonchi, no rales, no hypoxia or respiratory distress, speaking full sentences ABD/GI: Non-distended; soft, non-tender, no rebound, no guarding, no peritoneal signs BACK:  The back appears normal EXT: Normal ROM in all joints; no deformity noted, pitting edema noted SKIN: Normal color for age and race; warm; no rash on exposed skin NEURO: Moves all extremities equally, normal speech PSYCH: The patient's mood and manner are appropriate.   ED Results / Procedures / Treatments   LABS: (all labs ordered are listed, but only abnormal results are displayed) Labs Reviewed  CBC WITH DIFFERENTIAL/PLATELET - Abnormal; Notable for the following components:      Result Value   WBC 19.0 (*)    Neutro Abs 12.8 (*)    Lymphs Abs 4.2 (*)    Monocytes Absolute 1.6 (*)    Abs Immature Granulocytes 0.27 (*)    All  other components within normal limits  COMPREHENSIVE METABOLIC PANEL - Abnormal; Notable for the following components:   Sodium 133 (*)    Potassium 3.4 (*)    Glucose, Bld 214 (*)    BUN 34 (*)    Creatinine, Ser 1.29 (*)    Calcium 7.3 (*)    Total Protein 4.7 (*)    Albumin <1.5 (*)    All other components within normal limits  URINALYSIS, ROUTINE W REFLEX MICROSCOPIC - Abnormal; Notable for the following components:   Color, Urine YELLOW (*)    APPearance CLOUDY (*)    Specific Gravity, Urine 1.037 (*)    Glucose, UA 150 (*)    Hgb urine dipstick SMALL (*)    Protein, ur >=300 (*)    Bacteria, UA FEW (*)    All other components within normal limits  PROTIME-INR - Abnormal; Notable for the following components:   Prothrombin Time 17.9 (*)    INR 1.5 (*)    All other components within normal limits  LACTIC ACID, PLASMA - Abnormal; Notable for the following components:   Lactic Acid, Venous 2.0 (*)    All other components within normal limits  BRAIN NATRIURETIC PEPTIDE - Abnormal; Notable for the following components:   B Natriuretic Peptide 1,182.8 (*)    All other components within normal limits  URINE DRUG SCREEN, QUALITATIVE ([facility] ONLY) - Abnormal; Notable for the following components:   Opiate, Ur Screen POSITIVE (*)    All other  components within normal limits  MAGNESIUM - Abnormal; Notable for the following components:   Magnesium 1.4 (*)    All other components within normal limits  BASIC METABOLIC PANEL - Abnormal; Notable for the following components:   Sodium 132 (*)    CO2 19 (*)    Glucose, Bld 229 (*)    BUN 30 (*)    Creatinine, Ser 1.22 (*)    Calcium 6.8 (*)    All other components within normal limits  GLUCOSE, CAPILLARY - Abnormal; Notable for the following components:   Glucose-Capillary 212 (*)    All other components within normal limits  CBG MONITORING, ED - Abnormal; Notable for the following components:   Glucose-Capillary 216 (*)    All other components within normal limits  TROPONIN I (HIGH SENSITIVITY) - Abnormal; Notable for the following components:   Troponin I (High Sensitivity) 37 (*)    All other components within normal limits  TROPONIN I (HIGH SENSITIVITY) - Abnormal; Notable for the following components:   Troponin I (High Sensitivity) 36 (*)    All other components within normal limits  RESP PANEL BY RT-PCR (RSV, FLU A&B, COVID)  RVPGX2  CULTURE, BLOOD (ROUTINE X 2)  CULTURE, BLOOD (ROUTINE X 2)  URINE CULTURE  LIPASE, BLOOD  PROCALCITONIN  PREGNANCY, URINE  LACTIC ACID, PLASMA  PHOSPHORUS  LACTIC ACID, PLASMA  LEGIONELLA PNEUMOPHILA SEROGP 1 UR AG  STREP PNEUMONIAE URINARY ANTIGEN  CBC  TACROLIMUS LEVEL  PROTIME-INR  APTT  POC URINE PREG, ED  TYPE AND SCREEN     EKG:  EKG Interpretation  Date/Time:  Friday October 13 2022 22:51:59 EDT Ventricular Rate:  109 PR Interval:  124 QRS Duration: 68 QT Interval:  346 QTC Calculation: 465 R Axis:   212 Text Interpretation: Sinus tachycardia Right superior axis deviation Possible Right ventricular hypertrophy Nonspecific ST and T wave abnormality Abnormal ECG When compared with ECG of 25-Sep-2022 11:49, Nonspecific T wave abnormality now evident in Lateral leads Confirmed by  Quamel Fitzmaurice, Baxter Hire [phone]) on 10/13/2022  11:07:28 PM         RADIOLOGY: My personal review and interpretation of imaging: Chest x-ray is concerning for right-sided pleural effusion and diffuse opacities in the right lung.  I have personally reviewed all radiology reports.   CT HEAD WO CONTRAST ( )  Result Date: 10/14/2022 CLINICAL DATA:  Syncope/presyncope. EXAM: CT HEAD WITHOUT CONTRAST TECHNIQUE: Contiguous axial images were obtained from the base of the skull through the vertex without intravenous contrast. RADIATION DOSE REDUCTION: This exam was performed according to the departmental dose-optimization program which includes automated exposure control, adjustment of the mA and/or kV according to patient size and/or use of iterative reconstruction technique. COMPARISON:  None Available. FINDINGS: Brain: No evidence of acute infarction, hemorrhage, hydrocephalus, extra-axial collection or mass lesion/mass effect. Vascular: No hyperdense vessel or unexpected calcification. Skull: Normal. Negative for fracture or focal lesion. Sinuses/Orbits: No acute finding. IMPRESSION: Negative head CT. Electronically Signed   By: Tiburcio Pea M.D.   On: 10/14/2022 04:38   DG Chest Portable 1 View  Result Date: 10/14/2022 CLINICAL DATA:  worsening SOB EXAM: PORTABLE CHEST 1 VIEW COMPARISON:  Chest x-ray 10/13/2022 FINDINGS: The heart and mediastinal contours are unchanged. Interval worsening of diffuse patchy airspace opacities within the right lung. No pulmonary edema. No pleural effusion. No pneumothorax. No acute osseous abnormality. IMPRESSION: Interval worsening of diffuse patchy airspace opacities within the right lung. Electronically Signed   By: Tish Frederickson M.D.   On: 10/14/2022 01:51   DG Chest Port 1 View  Result Date: 10/13/2022 CLINICAL DATA:  Pain. EXAM: PORTABLE CHEST 1 VIEW COMPARISON:  Radiograph and CT yesterday FINDINGS: Similar right pleural effusion, the left pleural effusion on CT is not well-defined by radiograph.  Ill-defined airspace disease throughout the right lung, similar in radiographic appearance. The cavitary components on CT are not well-defined by radiograph. Stable heart size and mediastinal contours. No pneumothorax. Remote right clavicle fracture. IMPRESSION: Unchanged appearance of the chest with right pleural effusion and ill-defined airspace disease throughout the right lung. Cavitary components on CT are not well-defined by radiograph. Electronically Signed   By: Narda Rutherford M.D.   On: 10/13/2022 23:17     PROCEDURES:  Critical Care performed: Yes, see critical care procedure note(s)   CRITICAL CARE Performed by: Baxter Hire Kawan [place]   Total critical care time: 40 minutes  Critical care time was exclusive of separately billable procedures and treating other patients.  Critical care was necessary to treat or prevent imminent or life-threatening deterioration.  Critical care was time spent personally by me on the following activities: development of treatment plan with patient and/or surrogate as well as nursing, discussions with consultants, evaluation of patient's response to treatment, examination of patient, obtaining history from patient or surrogate, ordering and performing treatments and interventions, ordering and review of laboratory studies, ordering and review of radiographic studies, pulse oximetry and re-evaluation of patient's condition.   Marland Kitchen1-3 Lead EKG Interpretation  Performed by: Luara Faye, Layla Maw, DO Authorized by: Damani Kelemen, Layla Maw, DO     Interpretation: normal     ECG rate:  96   ECG rate assessment: normal     Rhythm: sinus rhythm     Ectopy: none     Conduction: normal       IMPRESSION / MDM / ASSESSMENT AND PLAN / ED COURSE  I reviewed the triage vital signs and the nursing notes.    Patient here with chest pain, shortness of breath, fevers, cough, vomiting  with streaks of blood and now hypotension, dizziness/vertigo.  The patient is on the cardiac  monitor to evaluate for evidence of arrhythmia and/or significant heart rate changes.   DIFFERENTIAL DIAGNOSIS (includes but not limited to):   Pneumonia, PE, CHF, viral URI, dehydration, electrolyte derangement, anemia, worsening nephrotic syndrome, sepsis   Patient's presentation is most consistent with acute presentation with potential threat to life or bodily function.   PLAN: Workup initiated from triage.  Patient has a worsening leukocytosis of 19,000.  Was 17,000 yesterday.  Troponin is slightly elevated at 37.  Second is pending.  Chronic kidney disease is stable.  LFTs, lipase normal.  Will obtain urinalysis, culture, urine pregnancy, chest x-ray, give IV hydration even despite her peripheral edema given I suspect she is volume depleted from IV torsemide yesterday and increasing her torsemide at home and she is quite hypotensive here.  We did discuss that this could be from sepsis but she is afebrile currently.   MEDICATIONS GIVEN IN ED: Medications  azithromycin (ZITHROMAX) 500 mg in sodium chloride 0.9 % 250 mL IVPB (has no administration in time range)  0.9 %  sodium chloride infusion (has no administration in time range)  norepinephrine (LEVOPHED) 4mg  in (0.016 mg/mL) premix infusion (2 mcg/min Intravenous New Bag/Given 10/14/22 0313)  docusate sodium (COLACE) capsule 100 mg (has no administration in time range)  polyethylene glycol (MIRALAX / GLYCOLAX) packet 17 g (has no administration in time range)  potassium chloride 10 mEq in 100 mL IVPB (has no administration in time range)  potassium chloride 10 mEq in 100 mL IVPB (has no administration in time range)  sodium chloride 0.9 % bolus 1,000 mL (0 mLs Intravenous Stopped 10/14/22 0122)  ondansetron (ZOFRAN) injection 4 mg (4 mg Intravenous Given 10/13/22 2341)  meclizine (ANTIVERT) tablet 25 mg (25 mg Oral Given 10/13/22 2342)  fentaNYL (SUBLIMAZE) injection 50 mcg (50 mcg Intravenous Given 10/13/22 2342)  sodium chloride 0.9  % bolus 1,000 mL (0 mLs Intravenous Stopped 10/14/22 0122)  cefTRIAXone (ROCEPHIN) 2 g in sodium chloride 0.9 % 100 mL IVPB (0 g Intravenous Stopped 10/14/22 0357)  furosemide (LASIX) injection 40 mg (40 mg Intravenous Given 10/14/22 0301)  fentaNYL (SUBLIMAZE) injection 50 mcg (50 mcg Intravenous Given 10/14/22 0343)     ED COURSE: Second troponin is still elevated but flat.  Her urine shows some red blood cells and white blood cells but also many squamous cells and appears to be more of a contaminated sample but culture is pending.  Her chest x-ray today showed right pleural effusion and right opacities similar to imaging yesterday.  CT scan was obtained yesterday that showed stable PEs and cavitary lesions.  Patient's blood pressure improving slightly with IV fluids.    Patient told nursing staff that she is starting to feel more short of breath.  Stopped her IV fluids.  She is received about a liter total.  Repeat chest x-ray reviewed and interpreted by myself and the radiologist shows worsening opacities.  Will add on BNP.  Patient will likely need diuresis and to be started on Levophed as she is still having systolic pressures in the 70s.  I am concerned for possible sepsis especially given her leukocytosis, hypotension and reported temperature at home of 100.  She will not be getting her 30 mL/kg IV fluid bolus as I suspect that she is becoming more volume overloaded with IV fluids causing her to have increased work of breathing.  She is hypoxic and requiring 2 L  of oxygen.  Will follow blood cultures, procalcitonin, lactic.  COVID, flu and RSV are negative.  Will discuss with critical care for admission.  She is getting antibiotics for potential pneumonia given her chest x-ray findings and that she is immunosuppressed.  She is getting covered for community-acquired pneumonia currently given she has not been in the hospital since March 2024 but she may need broader coverage given her immunocompromise  state.   CONSULTS: Patient has been seen by Webb Silversmith, NP with critical care.  Appreciate her help.  She is going to order a high-resolution CT scan of her chest.  Procalcitonin today is negative.  Lactic is normal.  BNP has come back elevated at 1182.  She is doing well on 2 L nasal cannula and given profound hypotension, will hold on excessive diuresis at this time but hold on giving further fluids.   Now that procalcitonin has come back negative, it is unclear if her hypotension is really due to sepsis or not.  Again it could be due to overdiuresis.  Critical care thinks that her pulmonary issues could be secondary to interstitial lung disease.  Will hold at this time on broadening her antibiotics.  OUTSIDE RECORDS REVIEWED: Reviewed previous admission notes - last admission in March 2024.       FINAL CLINICAL IMPRESSION(S) / ED DIAGNOSES   Final diagnoses:  Community acquired pneumonia, unspecified laterality  Acute pulmonary edema  Acute respiratory failure with hypoxia  Septic shock     Rx / DC Orders   ED Discharge Orders     None        Note:  This document was prepared using Dragon voice recognition software and may include unintentional dictation errors.   Tywanda Rice, Layla Maw, DO 10/14/22 [phone]

## 2022-10-13 NOTE — ED Triage Notes (Signed)
Pt presents ambulatory to triage via POV with complaints of dizziness and hematemesis that started tonight. Pt has an extensive medication hx including T1DM, PE (on Warfarin), and nephrotic syndrome. Pt endorse dizziness with standing. A&Ox4 at this time. Denies CP.

## 2022-10-13 NOTE — ED Notes (Signed)
Pt taken to the room via wheelchair and attached to the monitor; primary RN made aware of the patients arrival.    

## 2022-10-13 NOTE — ED Notes (Signed)
MD aware of pressure before administering fentanyl IVP

## 2022-10-14 ENCOUNTER — Inpatient Hospital Stay: Payer: Medicaid Other

## 2022-10-14 ENCOUNTER — Other Ambulatory Visit: Payer: Self-pay

## 2022-10-14 ENCOUNTER — Emergency Department: Payer: Medicaid Other

## 2022-10-14 DIAGNOSIS — R042 Hemoptysis: Secondary | ICD-10-CM | POA: Diagnosis present

## 2022-10-14 DIAGNOSIS — Z794 Long term (current) use of insulin: Secondary | ICD-10-CM | POA: Diagnosis not present

## 2022-10-14 DIAGNOSIS — A419 Sepsis, unspecified organism: Secondary | ICD-10-CM | POA: Diagnosis present

## 2022-10-14 DIAGNOSIS — I2729 Other secondary pulmonary hypertension: Secondary | ICD-10-CM | POA: Diagnosis not present

## 2022-10-14 DIAGNOSIS — E785 Hyperlipidemia, unspecified: Secondary | ICD-10-CM | POA: Diagnosis present

## 2022-10-14 DIAGNOSIS — N1831 Chronic kidney disease, stage 3a: Secondary | ICD-10-CM | POA: Diagnosis not present

## 2022-10-14 DIAGNOSIS — E1065 Type 1 diabetes mellitus with hyperglycemia: Secondary | ICD-10-CM | POA: Diagnosis present

## 2022-10-14 DIAGNOSIS — I071 Rheumatic tricuspid insufficiency: Secondary | ICD-10-CM | POA: Diagnosis present

## 2022-10-14 DIAGNOSIS — A403 Sepsis due to Streptococcus pneumoniae: Secondary | ICD-10-CM | POA: Diagnosis not present

## 2022-10-14 DIAGNOSIS — E8809 Other disorders of plasma-protein metabolism, not elsewhere classified: Secondary | ICD-10-CM | POA: Diagnosis present

## 2022-10-14 DIAGNOSIS — I959 Hypotension, unspecified: Secondary | ICD-10-CM | POA: Diagnosis not present

## 2022-10-14 DIAGNOSIS — Z1152 Encounter for screening for COVID-19: Secondary | ICD-10-CM | POA: Diagnosis not present

## 2022-10-14 DIAGNOSIS — R0602 Shortness of breath: Secondary | ICD-10-CM | POA: Diagnosis present

## 2022-10-14 DIAGNOSIS — J189 Pneumonia, unspecified organism: Secondary | ICD-10-CM | POA: Diagnosis not present

## 2022-10-14 DIAGNOSIS — I371 Nonrheumatic pulmonary valve insufficiency: Secondary | ICD-10-CM | POA: Diagnosis not present

## 2022-10-14 DIAGNOSIS — I3139 Other pericardial effusion (noninflammatory): Secondary | ICD-10-CM | POA: Diagnosis not present

## 2022-10-14 DIAGNOSIS — I272 Pulmonary hypertension, unspecified: Secondary | ICD-10-CM | POA: Diagnosis not present

## 2022-10-14 DIAGNOSIS — R6521 Severe sepsis with septic shock: Secondary | ICD-10-CM | POA: Diagnosis not present

## 2022-10-14 DIAGNOSIS — D84821 Immunodeficiency due to drugs: Secondary | ICD-10-CM | POA: Diagnosis not present

## 2022-10-14 DIAGNOSIS — J9601 Acute respiratory failure with hypoxia: Secondary | ICD-10-CM | POA: Diagnosis present

## 2022-10-14 DIAGNOSIS — I50810 Right heart failure, unspecified: Secondary | ICD-10-CM | POA: Diagnosis not present

## 2022-10-14 DIAGNOSIS — D631 Anemia in chronic kidney disease: Secondary | ICD-10-CM | POA: Diagnosis not present

## 2022-10-14 DIAGNOSIS — N2889 Other specified disorders of kidney and ureter: Secondary | ICD-10-CM | POA: Diagnosis present

## 2022-10-14 DIAGNOSIS — E1022 Type 1 diabetes mellitus with diabetic chronic kidney disease: Secondary | ICD-10-CM | POA: Diagnosis not present

## 2022-10-14 DIAGNOSIS — I5031 Acute diastolic (congestive) heart failure: Secondary | ICD-10-CM | POA: Diagnosis not present

## 2022-10-14 DIAGNOSIS — N2581 Secondary hyperparathyroidism of renal origin: Secondary | ICD-10-CM | POA: Diagnosis not present

## 2022-10-14 DIAGNOSIS — E8779 Other fluid overload: Secondary | ICD-10-CM | POA: Diagnosis not present

## 2022-10-14 DIAGNOSIS — I129 Hypertensive chronic kidney disease with stage 1 through stage 4 chronic kidney disease, or unspecified chronic kidney disease: Secondary | ICD-10-CM | POA: Diagnosis not present

## 2022-10-14 DIAGNOSIS — I2724 Chronic thromboembolic pulmonary hypertension: Secondary | ICD-10-CM | POA: Diagnosis present

## 2022-10-14 DIAGNOSIS — I2699 Other pulmonary embolism without acute cor pulmonale: Secondary | ICD-10-CM | POA: Diagnosis present

## 2022-10-14 DIAGNOSIS — N179 Acute kidney failure, unspecified: Secondary | ICD-10-CM | POA: Diagnosis present

## 2022-10-14 HISTORY — DX: Hypotension, unspecified: I95.9

## 2022-10-14 HISTORY — DX: Sepsis, unspecified organism: A41.9

## 2022-10-14 LAB — URINALYSIS, ROUTINE W REFLEX MICROSCOPIC
Bilirubin Urine: NEGATIVE
Glucose, UA: 150 mg/dL — AB
Ketones, ur: NEGATIVE mg/dL
Leukocytes,Ua: NEGATIVE
Nitrite: NEGATIVE
Protein, ur: >=300 mg/dL — AB
Specific Gravity, Urine: 1.037 — ABNORMAL HIGH (ref 1.005–1.030)
pH: 6 (ref 5.0–8.0)

## 2022-10-14 LAB — CBC
HCT: 40.3 % (ref 36.0–46.0)
Hemoglobin: 12.6 g/dL (ref 12.0–15.0)
MCH: 26.6 pg (ref 26.0–34.0)
MCHC: 31.3 g/dL (ref 30.0–36.0)
MCV: 85.2 fL (ref 80.0–100.0)
Platelets: 330 10*3/uL (ref 150–400)
RBC: 4.73 MIL/uL (ref 3.87–5.11)
RDW: 15.6 % — ABNORMAL HIGH (ref 11.5–15.5)
WBC: 18.4 10*3/uL — ABNORMAL HIGH (ref 4.0–10.5)
nRBC: 0.1 % (ref 0.0–0.2)

## 2022-10-14 LAB — COMPREHENSIVE METABOLIC PANEL
ALT: 12 U/L (ref 0–44)
AST: 18 U/L (ref 15–41)
Albumin: <1.5 g/dL — ABNORMAL LOW (ref 3.5–5.0)
Alkaline Phosphatase: 115 U/L (ref 38–126)
Anion gap: 11 (ref 5–15)
BUN: 34 mg/dL — ABNORMAL HIGH (ref 6–20)
CO2: 22 mmol/L (ref 22–32)
Calcium: 7.3 mg/dL — ABNORMAL LOW (ref 8.9–10.3)
Chloride: 100 mmol/L (ref 98–111)
Creatinine, Ser: 1.29 mg/dL — ABNORMAL HIGH (ref 0.44–1.00)
GFR, Estimated: >60 mL/min (ref >60)
Glucose, Bld: 214 mg/dL — ABNORMAL HIGH (ref 70–99)
Potassium: 3.4 mmol/L — ABNORMAL LOW (ref 3.5–5.1)
Sodium: 133 mmol/L — ABNORMAL LOW (ref 135–145)
Total Bilirubin: 0.6 mg/dL (ref 0.3–1.2)
Total Protein: 4.7 g/dL — ABNORMAL LOW (ref 6.5–8.1)

## 2022-10-14 LAB — BASIC METABOLIC PANEL
Anion gap: 10 (ref 5–15)
BUN: 30 mg/dL — ABNORMAL HIGH (ref 6–20)
CO2: 19 mmol/L — ABNORMAL LOW (ref 22–32)
Calcium: 6.8 mg/dL — ABNORMAL LOW (ref 8.9–10.3)
Chloride: 103 mmol/L (ref 98–111)
Creatinine, Ser: 1.22 mg/dL — ABNORMAL HIGH (ref 0.44–1.00)
GFR, Estimated: >60 mL/min (ref >60)
Glucose, Bld: 229 mg/dL — ABNORMAL HIGH (ref 70–99)
Potassium: 4 mmol/L (ref 3.5–5.1)
Sodium: 132 mmol/L — ABNORMAL LOW (ref 135–145)

## 2022-10-14 LAB — LACTIC ACID, PLASMA
Lactic Acid, Venous: 1.6 mmol/L (ref 0.5–1.9)
Lactic Acid, Venous: 2 mmol/L (ref 0.5–1.9)
Lactic Acid, Venous: 1.5 mmol/L (ref 0.5–1.9)

## 2022-10-14 LAB — TYPE AND SCREEN
ABO/RH(D): B POS
Antibody Screen: NEGATIVE

## 2022-10-14 LAB — RESP PANEL BY RT-PCR (RSV, FLU A&B, COVID)  RVPGX2
Influenza A by PCR: NEGATIVE
Influenza B by PCR: NEGATIVE
Resp Syncytial Virus by PCR: NEGATIVE
SARS Coronavirus 2 by RT PCR: NEGATIVE

## 2022-10-14 LAB — LIPASE, BLOOD: Lipase: 27 U/L (ref 11–51)

## 2022-10-14 LAB — URINE DRUG SCREEN, QUALITATIVE (ARMC ONLY)
Amphetamines, Ur Screen: NOT DETECTED
Barbiturates, Ur Screen: NOT DETECTED
Benzodiazepine, Ur Scrn: NOT DETECTED
Cannabinoid 50 Ng, Ur ~~LOC~~: NOT DETECTED
Cocaine Metabolite,Ur ~~LOC~~: NOT DETECTED
MDMA (Ecstasy)Ur Screen: NOT DETECTED
Methadone Scn, Ur: NOT DETECTED
Opiate, Ur Screen: POSITIVE — AB
Phencyclidine (PCP) Ur S: NOT DETECTED
Tricyclic, Ur Screen: NOT DETECTED

## 2022-10-14 LAB — HEPARIN LEVEL (UNFRACTIONATED)
Heparin Unfractionated: 0.14 IU/mL — ABNORMAL LOW (ref 0.30–0.70)
Heparin Unfractionated: 0.25 IU/mL — ABNORMAL LOW (ref 0.30–0.70)

## 2022-10-14 LAB — PHOSPHORUS: Phosphorus: 4 mg/dL (ref 2.5–4.6)

## 2022-10-14 LAB — STREP PNEUMONIAE URINARY ANTIGEN: Strep Pneumo Urinary Antigen: NEGATIVE

## 2022-10-14 LAB — MRSA NEXT GEN BY PCR, NASAL: MRSA by PCR Next Gen: NOT DETECTED

## 2022-10-14 LAB — MAGNESIUM: Magnesium: 1.4 mg/dL — ABNORMAL LOW (ref 1.7–2.4)

## 2022-10-14 LAB — TROPONIN I (HIGH SENSITIVITY)
Troponin I (High Sensitivity): 36 ng/L — ABNORMAL HIGH (ref <18)
Troponin I (High Sensitivity): 37 ng/L — ABNORMAL HIGH (ref <18)

## 2022-10-14 LAB — GLUCOSE, CAPILLARY
Glucose-Capillary: 212 mg/dL — ABNORMAL HIGH (ref 70–99)
Glucose-Capillary: 251 mg/dL — ABNORMAL HIGH (ref 70–99)
Glucose-Capillary: 298 mg/dL — ABNORMAL HIGH (ref 70–99)
Glucose-Capillary: 223 mg/dL — ABNORMAL HIGH (ref 70–99)
Glucose-Capillary: 230 mg/dL — ABNORMAL HIGH (ref 70–99)
Glucose-Capillary: 248 mg/dL — ABNORMAL HIGH (ref 70–99)
Glucose-Capillary: 278 mg/dL — ABNORMAL HIGH (ref 70–99)

## 2022-10-14 LAB — APTT: aPTT: 30 seconds (ref 24–36)

## 2022-10-14 LAB — PROTIME-INR
INR: 1.5 — ABNORMAL HIGH (ref 0.8–1.2)
Prothrombin Time: 18.1 seconds — ABNORMAL HIGH (ref 11.4–15.2)

## 2022-10-14 LAB — BRAIN NATRIURETIC PEPTIDE: B Natriuretic Peptide: 1182.8 pg/mL — ABNORMAL HIGH (ref 0.0–100.0)

## 2022-10-14 LAB — CULTURE, BLOOD (ROUTINE X 2)

## 2022-10-14 LAB — PROCALCITONIN: Procalcitonin: <0.1 ng/mL

## 2022-10-14 LAB — PREGNANCY, URINE: Preg Test, Ur: NEGATIVE

## 2022-10-14 MED ORDER — HEPARIN BOLUS VIA INFUSION
3350.0000 [IU] | Freq: Once | INTRAVENOUS | Status: AC
  Administered 2022-10-14: 3350 [IU] via INTRAVENOUS
  Filled 2022-10-14: qty 3350

## 2022-10-14 MED ORDER — POLYETHYLENE GLYCOL 3350 17 G PO PACK: 17.0000 g | PACK | Freq: Every day | ORAL | Status: DC | PRN

## 2022-10-14 MED ORDER — METHYLPREDNISOLONE SODIUM SUCC 40 MG IJ SOLR
40.0000 mg | Freq: Every day | INTRAMUSCULAR | Status: DC
  Administered 2022-10-15 – 2022-10-17 (×3): 40 mg via INTRAVENOUS
  Filled 2022-10-14 (×3): qty 1

## 2022-10-14 MED ORDER — HEPARIN BOLUS VIA INFUSION
1000.0000 [IU] | Freq: Once | INTRAVENOUS | Status: AC
  Administered 2022-10-14: 1000 [IU] via INTRAVENOUS
  Filled 2022-10-14: qty 1000

## 2022-10-14 MED ORDER — INSULIN ASPART 100 UNIT/ML IJ SOLN
0.0000 [IU] | INTRAMUSCULAR | Status: DC
  Administered 2022-10-14: 11 [IU] via SUBCUTANEOUS
  Administered 2022-10-14 (×2): 7 [IU] via SUBCUTANEOUS
  Administered 2022-10-14: 11 [IU] via SUBCUTANEOUS
  Administered 2022-10-14: 7 [IU] via SUBCUTANEOUS
  Administered 2022-10-15: 15 [IU] via SUBCUTANEOUS
  Administered 2022-10-15: 11 [IU] via SUBCUTANEOUS
  Administered 2022-10-15: 7 [IU] via SUBCUTANEOUS
  Administered 2022-10-15: 4 [IU] via SUBCUTANEOUS
  Administered 2022-10-16: 20 [IU] via SUBCUTANEOUS
  Administered 2022-10-16: 4 [IU] via SUBCUTANEOUS
  Administered 2022-10-16: 15 [IU] via SUBCUTANEOUS
  Administered 2022-10-16: 4 [IU] via SUBCUTANEOUS
  Administered 2022-10-16 – 2022-10-17 (×2): 15 [IU] via SUBCUTANEOUS
  Administered 2022-10-17: 3 [IU] via SUBCUTANEOUS
  Filled 2022-10-14 (×16): qty 1

## 2022-10-14 MED ORDER — POTASSIUM CHLORIDE 10 MEQ/100ML IV SOLN
10.0000 meq | INTRAVENOUS | Status: DC
  Filled 2022-10-14 (×2): qty 100

## 2022-10-14 MED ORDER — ACETAMINOPHEN 325 MG PO TABS
650.0000 mg | ORAL_TABLET | ORAL | Status: DC | PRN
  Administered 2022-10-16 (×2): 650 mg via ORAL
  Filled 2022-10-14 (×2): qty 2

## 2022-10-14 MED ORDER — NOREPINEPHRINE 4 MG/250ML-% IV SOLN
2.0000 ug/min | INTRAVENOUS | Status: DC
  Administered 2022-10-14: 2 ug/min via INTRAVENOUS
  Filled 2022-10-14: qty 250

## 2022-10-14 MED ORDER — DOCUSATE SODIUM 100 MG PO CAPS: 100.0000 mg | ORAL_CAPSULE | Freq: Two times a day (BID) | ORAL | Status: DC | PRN

## 2022-10-14 MED ORDER — VANCOMYCIN HCL 1500 MG/300ML IV SOLN
1500.0000 mg | Freq: Once | INTRAVENOUS | Status: AC
  Administered 2022-10-14: 1500 mg via INTRAVENOUS
  Filled 2022-10-14: qty 300

## 2022-10-14 MED ORDER — HEPARIN (PORCINE) 25000 UT/250ML-% IV SOLN
1150.0000 [IU]/h | INTRAVENOUS | Status: DC
  Administered 2022-10-14: 800 [IU]/h via INTRAVENOUS
  Administered 2022-10-15 – 2022-10-18 (×4): 1150 [IU]/h via INTRAVENOUS
  Filled 2022-10-14 (×5): qty 250

## 2022-10-14 MED ORDER — SODIUM CHLORIDE 0.9 % IV SOLN
250.0000 mL | INTRAVENOUS | Status: DC
  Administered 2022-10-14 – 2022-10-15 (×2): 250 mL via INTRAVENOUS

## 2022-10-14 MED ORDER — IBUPROFEN 400 MG PO TABS
800.0000 mg | ORAL_TABLET | Freq: Four times a day (QID) | ORAL | Status: DC | PRN
  Administered 2022-10-17: 800 mg via ORAL
  Filled 2022-10-14: qty 2

## 2022-10-14 MED ORDER — HEPARIN BOLUS VIA INFUSION
1700.0000 [IU] | Freq: Once | INTRAVENOUS | Status: AC
  Administered 2022-10-14: 1700 [IU] via INTRAVENOUS
  Filled 2022-10-14: qty 1700

## 2022-10-14 MED ORDER — CHLORHEXIDINE GLUCONATE CLOTH 2 % EX PADS
6.0000 | MEDICATED_PAD | Freq: Every day | CUTANEOUS | Status: DC
  Administered 2022-10-15 – 2022-10-18 (×3): 6 via TOPICAL

## 2022-10-14 MED ORDER — SODIUM CHLORIDE 0.9 % IV SOLN
2.0000 g | Freq: Three times a day (TID) | INTRAVENOUS | Status: DC
  Administered 2022-10-14: 2 g via INTRAVENOUS
  Filled 2022-10-14 (×2): qty 12.5

## 2022-10-14 MED ORDER — VANCOMYCIN HCL IN DEXTROSE 1-5 GM/200ML-% IV SOLN: 1000.0000 mg | INTRAVENOUS | Status: DC

## 2022-10-14 MED ORDER — NOREPINEPHRINE 4 MG/250ML-% IV SOLN: 0.0000 ug/min | INTRAVENOUS | Status: DC

## 2022-10-14 MED ORDER — SODIUM CHLORIDE 0.9 % IV SOLN
500.0000 mg | Freq: Once | INTRAVENOUS | Status: AC
  Administered 2022-10-14: 500 mg via INTRAVENOUS
  Filled 2022-10-14: qty 5
  Filled 2022-10-14: qty 500

## 2022-10-14 MED ORDER — SODIUM CHLORIDE 0.9 % IV SOLN
2.0000 g | Freq: Two times a day (BID) | INTRAVENOUS | Status: DC
  Administered 2022-10-14 – 2022-10-17 (×6): 2 g via INTRAVENOUS
  Filled 2022-10-14 (×4): qty 12.5
  Filled 2022-10-14: qty 2
  Filled 2022-10-14: qty 12.5
  Filled 2022-10-14: qty 2

## 2022-10-14 MED ORDER — METHYLPREDNISOLONE SODIUM SUCC 125 MG IJ SOLR
125.0000 mg | Freq: Once | INTRAMUSCULAR | Status: AC
  Administered 2022-10-14: 125 mg via INTRAVENOUS
  Filled 2022-10-14: qty 2

## 2022-10-14 MED ORDER — FENTANYL CITRATE PF 50 MCG/ML IJ SOSY
50.0000 ug | PREFILLED_SYRINGE | Freq: Once | INTRAMUSCULAR | Status: AC
  Administered 2022-10-14: 50 ug via INTRAVENOUS
  Filled 2022-10-14: qty 1

## 2022-10-14 MED ORDER — SODIUM CHLORIDE 0.9 % IV SOLN
2.0000 g | Freq: Once | INTRAVENOUS | Status: AC
  Administered 2022-10-14: 2 g via INTRAVENOUS
  Filled 2022-10-14: qty 20

## 2022-10-14 MED ORDER — MAGNESIUM SULFATE 4 GM/100ML IV SOLN
4.0000 g | Freq: Once | INTRAVENOUS | Status: AC
  Administered 2022-10-14: 4 g via INTRAVENOUS
  Filled 2022-10-14: qty 100

## 2022-10-14 MED ORDER — FUROSEMIDE 10 MG/ML IJ SOLN
40.0000 mg | Freq: Once | INTRAMUSCULAR | Status: AC
  Administered 2022-10-14: 40 mg via INTRAVENOUS
  Filled 2022-10-14: qty 4

## 2022-10-14 MED ORDER — ONDANSETRON HCL 4 MG/2ML IJ SOLN
4.0000 mg | Freq: Four times a day (QID) | INTRAMUSCULAR | Status: DC | PRN
  Administered 2022-10-14 – 2022-10-18 (×6): 4 mg via INTRAVENOUS
  Filled 2022-10-14 (×6): qty 2

## 2022-10-14 MED ORDER — ALBUMIN HUMAN 5 % IV SOLN
12.5000 g | Freq: Once | INTRAVENOUS | Status: AC
  Administered 2022-10-14: 12.5 g via INTRAVENOUS
  Filled 2022-10-14: qty 250

## 2022-10-14 MED ORDER — FUROSEMIDE 10 MG/ML IJ SOLN
20.0000 mg | Freq: Every day | INTRAMUSCULAR | Status: DC
  Administered 2022-10-14 – 2022-10-18 (×5): 20 mg via INTRAVENOUS
  Filled 2022-10-14 (×5): qty 2

## 2022-10-14 MED ORDER — IPRATROPIUM-ALBUTEROL 0.5-2.5 (3) MG/3ML IN SOLN: 3.0000 mL | Freq: Four times a day (QID) | RESPIRATORY_TRACT | Status: DC | PRN

## 2022-10-14 MED ORDER — POTASSIUM CHLORIDE 10 MEQ/100ML IV SOLN
10.0000 meq | INTRAVENOUS | Status: AC
  Filled 2022-10-14 (×2): qty 100

## 2022-10-14 NOTE — Progress Notes (Signed)
ANTICOAGULATION CONSULT NOTE   Pharmacy Consult for Heparin  Indication: pulmonary embolus  No Known Allergies  Patient Measurements: Height:  (149.9 cm) Weight: 60.2 kg (132 lb 11.5 oz) IBW/kg (Calculated) : 43.2 Heparin Dosing Weight: 55.9 kg   Vital Signs: Temp: 97.3 F (36.3 C) (04/13 1300) Temp Source: Axillary (04/13 1300) BP: 96/69 (04/13 1300) Pulse Rate: 84 (04/13 1300)  Labs: Recent Labs    10/12/22 0805 10/12/22 0847 10/12/22 0847 10/13/22 2314 10/14/22 0056 10/14/22 0320 10/14/22 0457 10/14/22 1346  HGB  --  13.2   < > 13.0  --   --  12.6  --   HCT  --  42.0  --  40.6  --   --  40.3  --   PLT  --  533*  --  349  --   --  330  --   APTT  --   --   --   --   --   --  30  --   LABPROT 20.6*  --   --  17.9*  --   --  18.1*  --   INR 1.8*  --   --  1.5*  --   --  1.5*  --   HEPARINUNFRC  --   --   --   --   --   --   --  0.14*  CREATININE  --  1.44*  --  1.29*  --  1.22*  --   --   TROPONINIHS  --   --   --  37* 36*  --   --   --    < > = values in this interval not displayed.     Estimated Creatinine Clearance: 57.1 mL/min (A) (by C-G formula based on SCr of 1.22 mg/dL (H)).   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 20 tablet 0 prn   benzonatate (TESSALON PERLES) 100 MG capsule Take 1 capsule (100 mg total) by mouth 3 (three) times daily as needed for cough. 30 capsule 1 prn   calcitRIOL (ROCALTROL) 0.25 MCG capsule Take 0.25 mcg by mouth as directed. Take 1 capsule (0.25 mcg) three times a week.   10/11/2022    chlorpheniramine-HYDROcodone (TUSSIONEX) 10-8 MG/5ML Take 5 mLs by mouth every 12 (twelve) hours as needed for cough. 115 mL 0 prm   glucagon 1 MG injection Inject 1 mg into the muscle as needed.   prn   insulin aspart (NOVOLOG) 100 UNIT/ML injection Inject 5-8 Units into the skin 3 (three) times daily before meals. 7.2 mL 2 10/13/2022   ketorolac (TORADOL) 10 MG tablet Take 1 tablet (10 mg total) by mouth every 6 (six) hours as needed. 20 tablet 0 prn   LANTUS 100 UNIT/ML injection Inject 0.15 mLs (15 Units total) into the skin at bedtime. 4.5 mL 2 10/13/2022   Multiple Vitamin (MULTIVITAMIN WITH MINERALS) TABS tablet Take 1 tablet by mouth daily.   10/13/2022   oxyCODONE (ROXICODONE) 5 MG immediate release tablet Take 1 tablet (5 mg  total) by mouth every 8 (eight) hours as needed for up to 2 days. 6 tablet 0 prn   tacrolimus (PROGRAF) 1 MG capsule Take 2 capsules (2 mg total) by mouth 2 (two) times daily. 120 capsule 2 10/13/2022   torsemide (DEMADEX) 20 MG tablet Take 1 tablet (20 mg total) by mouth daily. 30 tablet 0 10/13/2022   Vitamin D, Ergocalciferol, (DRISDOL) 1.25 MG (50000 UNIT) CAPS capsule Take 50,000 Units by mouth every 7 (seven) days.   Past Week   warfarin (COUMADIN) 5 MG tablet Take 1 tablet (5 mg total) by mouth daily. 30 tablet 2 10/13/2022 at 1900   blood glucose meter kit and supplies Dispense based on patient and insurance preference. Use up to four times daily as directed. (FOR ICD-10 E10.9, E11.9). 1 each 0    enoxaparin (LOVENOX) 60 MG/0.6ML injection Inject 0.575 mLs (57.5 mg total) into the skin every 12 (twelve) hours. (Patient not taking: Reported on 10/14/2022) 14 mL 0 Not Taking   feeding supplement, GLUCERNA SHAKE, (GLUCERNA SHAKE) LIQD Take 237 mLs by mouth 2 (two) times daily between meals.  0    sulfamethoxazole-trimethoprim (BACTRIM DS) 800-160 MG tablet Take 1 tablet by mouth every Monday, Wednesday, and Friday. (Patient not taking: Reported on 10/14/2022) 12 tablet 2  Completed Course   torsemide (DEMADEX) 20 MG tablet Take 1 tablet (20 mg total) by mouth daily. 30 tablet 0     Assessment: Pharmacy consulted to dose heparin for PE in this 22 year old female admitted with sepsis.  Per PTA med list, pt was on lovenox 57.5 mg SQ Q12H at some point but was no longer taking.  Pt was on Warfarin 5 mg PO daily,  last dose on 4/12 @ 1900.   4/13 1346 HL 0.14    Goal of Therapy:  Heparin level 0.3-0.7 units/ml Monitor platelets by anticoagulation protocol: Yes   Plan:  Heparin level is subtherapeutic. Will give heparin bolus 1700 units x 1 and increase the heparin infusion to 950 units/hr. Recheck heparin level in 6 hours. CBC daily while on heparin.   Ronnald Ramp, PharmD, BCPS 10/14/2022,2:29 PM

## 2022-10-14 NOTE — ED Notes (Signed)
Ambulated with Pt ~73ft to commode, Pt reported dizziness and nausea, unsteady gait. Assisted back to bed and VS were taken and provider notified. Side rails up

## 2022-10-14 NOTE — Progress Notes (Signed)
ANTICOAGULATION CONSULT NOTE - Initial Consult  Pharmacy Consult for Heparin  Indication: pulmonary embolus  No Known Allergies  Patient Measurements: Height:  (149.9 cm) Weight: 60.2 kg (132 lb 11.5 oz) IBW/kg (Calculated) : 43.2 Heparin Dosing Weight: 55.9 kg   Vital Signs: Temp: 98.5 F (36.9 C) (04/13 0345) Temp Source: Oral (04/12 2253) BP: 84/72 (04/13 0500) Pulse Rate: 92 (04/13 0500)  Labs: Recent Labs    10/12/22 0805 10/12/22 0847 10/12/22 0847 10/13/22 2314 10/14/22 0056 10/14/22 0320 10/14/22 0457  HGB  --  13.2   < > 13.0  --   --  12.6  HCT  --  42.0  --  40.6  --   --  40.3  PLT  --  533*  --  349  --   --  330  APTT  --   --   --   --   --   --  30  LABPROT 20.6*  --   --  17.9*  --   --  18.1*  INR 1.8*  --   --  1.5*  --   --  1.5*  CREATININE  --  1.44*  --  1.29*  --  1.22*  --   TROPONINIHS  --   --   --  37* 36*  --   --    < > = values in this interval not displayed.    Estimated Creatinine Clearance: 57.1 mL/min (A) (by C-G formula based on SCr of 1.22 mg/dL (H)).   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 20 tablet 0 prn   benzonatate (TESSALON PERLES) 100 MG capsule Take 1 capsule (100 mg total) by mouth 3 (three) times daily as needed for cough. 30 capsule 1 prn   calcitRIOL (ROCALTROL) 0.25 MCG capsule Take 0.25 mcg by mouth as directed. Take 1 capsule (0.25 mcg) three times a week.   10/11/2022   chlorpheniramine-HYDROcodone (TUSSIONEX) 10-8 MG/5ML Take 5 mLs by mouth every 12 (twelve) hours as needed  for cough. 115 mL 0 prm   glucagon 1 MG injection Inject 1 mg into the muscle as needed.   prn   insulin aspart (NOVOLOG) 100 UNIT/ML injection Inject 5-8 Units into the skin 3 (three) times daily before meals. 7.2 mL 2 10/13/2022   ketorolac (TORADOL) 10 MG tablet Take 1 tablet (10 mg total) by mouth every 6 (six) hours as needed. 20 tablet 0 prn   LANTUS 100 UNIT/ML injection Inject 0.15 mLs (15 Units total) into the skin at bedtime. 4.5 mL 2 10/13/2022   Multiple Vitamin (MULTIVITAMIN WITH MINERALS) TABS tablet Take 1 tablet by mouth daily.   10/13/2022   oxyCODONE (ROXICODONE) 5 MG immediate release tablet Take 1 tablet (5 mg total) by mouth every 8 (eight) hours as needed for up to 2 days. 6 tablet 0 prn   tacrolimus (PROGRAF) 1 MG capsule Take 2 capsules (2 mg total) by mouth 2 (two) times daily. 120 capsule 2 10/13/2022   torsemide (DEMADEX) 20 MG tablet Take  1 tablet (20 mg total) by mouth daily. 30 tablet 0 10/13/2022   Vitamin D, Ergocalciferol, (DRISDOL) 1.25 MG (50000 UNIT) CAPS capsule Take 50,000 Units by mouth every 7 (seven) days.   Past Week   warfarin (COUMADIN) 5 MG tablet Take 1 tablet (5 mg total) by mouth daily. 30 tablet 2 10/13/2022 at 1900   blood glucose meter kit and supplies Dispense based on patient and insurance preference. Use up to four times daily as directed. (FOR ICD-10 E10.9, E11.9). 1 each 0    enoxaparin (LOVENOX) 60 MG/0.6ML injection Inject 0.575 mLs (57.5 mg total) into the skin every 12 (twelve) hours. (Patient not taking: Reported on 10/14/2022) 14 mL 0 Not Taking   feeding supplement, GLUCERNA SHAKE, (GLUCERNA SHAKE) LIQD Take 237 mLs by mouth 2 (two) times daily between meals.  0    sulfamethoxazole-trimethoprim (BACTRIM DS) 800-160 MG tablet Take 1 tablet by mouth every Monday, Wednesday, and Friday. (Patient not taking: Reported on 10/14/2022) 12 tablet 2 Completed Course   torsemide (DEMADEX) 20 MG tablet Take 1 tablet (20 mg total) by mouth daily. 30 tablet 0      Assessment: Pharmacy consulted to dose heparin for PE in this 22 year old female admitted with sepsis.  Per PTA med list, pt was on lovenox 57.5 mg SQ Q12H at some point but was no longer taking.  Pt was on Warfarin 5 mg PO daily,  last dose on 4/12 @ 1900.  4/13:  INR @ 0457 = 1.5 - subtherapeutic   CrCl = 57.1 ml/min   Goal of Therapy:  Heparin level 0.3-0.7 units/ml Monitor platelets by anticoagulation protocol: Yes   Plan:  Give 3350 units bolus x 1 Start heparin infusion at 800 units/hr Check anti-Xa level in 6 hours and daily while on heparin Continue to monitor H&H and platelets  Costas Sena D 10/14/2022,6:31 AM

## 2022-10-14 NOTE — Progress Notes (Signed)
Pharmacy Antibiotic Note  Catherine [name] is a 22 y.o. female admitted on 10/13/2022 with sepsis.  Pharmacy has been consulted for Vanc, Cefepime dosing.  Plan: Cefepime 2 gm IV Q8H ordered to start on 4/13 @ ~ 0700.  Vancomycin 1500 mg IV X 1 ordered for 4/13 @ 0800.  Vancomycin 1 gm IV Q24H ordered to start on 4/14 @ 0800.  AUC = 508.8 Vanc trough = 12.3   Height: 4\' 11"  (149.9 cm) Weight: 60.2 kg (132 lb 11.5 oz) IBW/kg (Calculated) : 43.2  Temp (24hrs), Avg:98.3 F (36.8 C), Min:98 F (36.7 C), Max:98.5 F (36.9 C)  Recent Labs  Lab 10/12/22 0847 10/13/22 2314 10/14/22 0247 10/14/22 0320 10/14/22 0457  WBC 17.1* 19.0*  --   --  18.4*  CREATININE 1.44* 1.29*  --  1.22*  --   LATICACIDVEN  --   --  1.6 2.0*  --     Estimated Creatinine Clearance: 57.1 mL/min (A) (by C-G formula based on SCr of 1.22 mg/dL (H)).    No Known Allergies  Antimicrobials this admission:   >>    >>   Dose adjustments this admission:   Microbiology results:  BCx:   UCx:    Sputum:    MRSA PCR:   Thank you for allowing pharmacy to be a part of this patient's care.  Endi Lagman D 10/14/2022 6:45 AM

## 2022-10-14 NOTE — Progress Notes (Signed)
0425: Patient admitted to ICU 10. MAP > 65. Levo not started at this time. NP Webb Silversmith notified.   7672: Messaged NP Webb Silversmith: pt serum potassium is 4.0. still want me to give IVPB replacement? Provider contacted [facility] r/t pharmacy ordering  electrolytes prior to admission to ICU.  Also notified Provider that patient POC Glucose 212 and no insulin coverage ordered at this time.  0947: Notified NP Webb Silversmith that patient c/o continuous nausea and dizziness. No PRN ordered. Provider to bedside to assess. New orders entered per [facility].   0624: Contacted Pharmacy again to clarify new IVPB potassium replacement order. Last serum potassium 4.0. RPH Robbins to d/c order.

## 2022-10-14 NOTE — Progress Notes (Addendum)
[facility]  ROUNDING NOTE   Subjective:   Catherine [name]  is a 22 y.o. 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 Thedore Mins.  Patient was last seen in office on 09/20/2022.Patient presents to the emergency department with complaints of dizziness and bloody emesis.  Patient is seen and evaluated bedside in ICU.  No family present.  Reports shortness of breath, chest tightness, nonproductive cough, and some vomiting prior to admission.  States that symptoms started on day of ED arrival.  Placed on 2 L nasal cannula Pressors: Levo Heparin drip in place  Labs on ED arrival significant for sodium 133, potassium 3.4, glucose 214, BUN 34, creatinine 1.29 albumin less than 1.5, calcium 7.3, BNP greater than 1100 and troponin 37.  Respiratory panel negative for influenza, COVID-19, and RSV.  UA appears cloudy with some proteinuria, not unexpected for this patient.  Chest x-ray shows a right pleural effusion with worsening diffuse patchy opacities.  Head CT negative for acute findings.  We have been consulted to manage chronic kidney disease.   Objective:  Vital signs in last 24 hours:  Temp:  [97.8 F (36.6 C)-98.5 F (36.9 C)] 97.8 F (36.6 C) (04/13 0701) Pulse Rate:  [86-109] 90 (04/13 0701) Resp:  [16-181] 24 (04/13 0701) BP: (74-111)/(53-99) 111/99 (04/13 0600) SpO2:  [94 %-100 %] 98 % (04/13 0701) Weight:  [54.5 kg-60.2 kg] 60.2 kg (04/13 0425)  Weight change:  Filed Weights   10/13/22 2257 10/14/22 0425  Weight: 54.5 kg 60.2 kg    Intake/Output: No intake/output data recorded.    Intake/Output this shift:  No intake/output data recorded.  Physical Exam: General: NAD, resting comfortably  Head: Normocephalic, atraumatic. Moist oral mucosal membranes  Eyes: Anicteric  Lungs:  Mild rhonchi, normal effort, NCO2  Heart: Regular rate and rhythm  Abdomen:  Soft, nontender  Extremities:  trace peripheral edema.  Neurologic: Alert and oriented, moving all four extremities  Skin: No lesions  Access: None    Basic Metabolic Panel: Recent Labs  Lab 10/12/22 0847 10/13/22 2314 10/14/22 0320  NA 133* 133* 132*  K 3.6 3.4* 4.0  CL 109 100 103  CO2 17* 22 19*  GLUCOSE 185* 214* 229*  BUN 48* 34* 30*  CREATININE 1.44* 1.29* 1.22*  CALCIUM 7.4* 7.3* 6.8*  MG  --   --  1.4*  PHOS  --   --  4.0    Liver Function Tests: Recent Labs  Lab 10/12/22 0847 10/13/22 2314  AST 18 18  ALT 11 12  ALKPHOS 113 115  BILITOT 0.4 0.6  PROT 4.2* 4.7*  ALBUMIN <1.5* <1.5*   Recent Labs  Lab 10/13/22 2314  LIPASE 27   No results for input(s): "AMMONIA" in the last 168 hours.  CBC: Recent Labs  Lab 10/12/22 0847 10/13/22 2314 10/14/22 0457  WBC 17.1* 19.0* 18.4*  NEUTROABS  --  12.8*  --   HGB 13.2 13.0 12.6  HCT 42.0 40.6 40.3  MCV 84.7 84.4 85.2  PLT 533* 349 330    Cardiac Enzymes: No results for input(s): "CKTOTAL", "CKMB", "CKMBINDEX", "TROPONINI" in the last 168 hours.  BNP: Invalid input(s): "POCBNP"  CBG: Recent Labs  Lab  10/13/22 2308 10/14/22 0430 10/14/22 0845  GLUCAP 216* 212* 251*    Microbiology: Results for orders placed or performed during the hospital encounter of 10/13/22  Resp panel by RT-PCR (RSV, Flu A&B, Covid) Anterior Nasal Swab     Status: None   Collection Time: 10/13/22 11:51 PM   Specimen: Anterior Nasal Swab  Result Value Ref Range Status   SARS Coronavirus 2 by RT PCR NEGATIVE NEGATIVE Final    Comment: (NOTE) SARS-CoV-2 target nucleic acids are NOT DETECTED.  The SARS-CoV-2 RNA is generally detectable in  upper respiratory specimens during the acute phase of infection. The lowest concentration of SARS-CoV-2 viral copies this assay can detect is 138 copies/mL. A negative result does not preclude SARS-Cov-2 infection and should not be used as the sole basis for treatment or other patient management decisions. A negative result may occur with  improper specimen collection/handling, submission of specimen other than nasopharyngeal swab, presence of viral mutation(s) within the areas targeted by this assay, and inadequate number of viral copies(<138 copies/mL). A negative result must be combined with clinical observations, patient history, and epidemiological information. The expected result is Negative.  Fact Sheet for Patients:  BloggerCourse.com  Fact Sheet for Healthcare Providers:  SeriousBroker.it  This test is no t yet approved or cleared by the [place] FDA and  has been authorized for detection and/or diagnosis of SARS-CoV-2 by FDA under an Emergency Use Authorization (EUA). This EUA will remain  in effect (meaning this test can be used) for the duration of the COVID-19 declaration under Section 564(b)(1) of the Act, 21 U.S.C.section 360bbb-3(b)(1), unless the authorization is terminated  or revoked sooner.       Influenza A by PCR NEGATIVE NEGATIVE Final   Influenza B by PCR NEGATIVE NEGATIVE Final    Comment: (NOTE) The Xpert Xpress SARS-CoV-2/FLU/RSV plus assay is intended as an aid in the diagnosis of influenza from Nasopharyngeal swab specimens and should not be used as a sole basis for treatment. Nasal washings and aspirates are unacceptable for Xpert Xpress SARS-CoV-2/FLU/RSV testing.  Fact Sheet for Patients: BloggerCourse.com  Fact Sheet for Healthcare Providers: SeriousBroker.it  This test is not yet approved or cleared by the [place] FDA and has been  authorized for detection and/or diagnosis of SARS-CoV-2 by FDA under an Emergency Use Authorization (EUA). This EUA will remain in effect (meaning this test can be used) for the duration of the COVID-19 declaration under Section 564(b)(1) of the Act, 21 U.S.C. section 360bbb-3(b)(1), unless the authorization is terminated or revoked.     Resp Syncytial Virus by PCR NEGATIVE NEGATIVE Final    Comment: (NOTE) Fact Sheet for Patients: BloggerCourse.com  Fact Sheet for Healthcare Providers: SeriousBroker.it  This test is not yet approved or cleared by the [place] FDA and has been authorized for detection and/or diagnosis of SARS-CoV-2 by FDA under an Emergency Use Authorization (EUA). This EUA will remain in effect (meaning this test can be used) for the duration of the COVID-19 declaration under Section 564(b)(1) of the Act, 21 U.S.C. section 360bbb-3(b)(1), unless the authorization is terminated or revoked.  Performed at [facility], [address] [place], [place] [address]   Blood culture (routine x 2)     Status: None (Preliminary result)   Collection Time: 10/14/22  2:47 AM   Specimen: BLOOD  Result Value Ref Range Status   Specimen Description BLOOD BLOOD RIGHT ARM  Final   Special Requests   Final    BOTTLES DRAWN  AEROBIC AND ANAEROBIC Blood Culture adequate volume   Culture   Final    NO GROWTH < 12 HOURS Performed at [facility], [address] [place], [place] [address]    Report Status PENDING  Incomplete  Blood culture (routine x 2)     Status: None (Preliminary result)   Collection Time: 10/14/22  2:47 AM   Specimen: BLOOD  Result Value Ref Range Status   Specimen Description BLOOD BLOOD RIGHT ARM  Final   Special Requests   Final    BOTTLES DRAWN AEROBIC AND ANAEROBIC Blood Culture adequate volume   Culture   Final    NO GROWTH < 12 HOURS Performed at [facility], [address], [place], [place] [address]    Report Status PENDING  Incomplete    Coagulation Studies: Recent Labs    10/12/22 0805 10/13/22 2314 10/14/22 0457  LABPROT 20.6* 17.9* 18.1*  INR 1.8* 1.5* 1.5*    Urinalysis: Recent Labs    10/13/22 2314  COLORURINE YELLOW*  LABSPEC 1.037*  PHURINE 6.0  GLUCOSEU 150*  HGBUR SMALL*  BILIRUBINUR NEGATIVE  KETONESUR NEGATIVE  PROTEINUR >=300*  NITRITE NEGATIVE  LEUKOCYTESUR NEGATIVE      Imaging: CT HEAD WO CONTRAST ( )  Result Date: 10/14/2022 CLINICAL DATA:  Syncope/presyncope. EXAM: CT HEAD WITHOUT CONTRAST TECHNIQUE: Contiguous axial images were obtained from the base of the skull through the vertex without intravenous contrast. RADIATION DOSE REDUCTION: This exam was performed according to the departmental dose-optimization program which includes automated exposure control, adjustment of the mA and/or kV according to patient size and/or use of iterative reconstruction technique. COMPARISON:  None Available. FINDINGS: Brain: No evidence of acute infarction, hemorrhage, hydrocephalus, extra-axial collection or mass lesion/mass effect. Vascular: No hyperdense vessel or unexpected calcification. Skull: Normal. Negative for fracture or focal lesion. Sinuses/Orbits: No acute finding. IMPRESSION: Negative head CT. Electronically Signed   By: Tiburcio Pea M.D.   On: 10/14/2022 04:38   DG Chest Portable 1 View  Result Date: 10/14/2022 CLINICAL DATA:  worsening SOB EXAM: PORTABLE CHEST 1 VIEW COMPARISON:  Chest x-ray 10/13/2022 FINDINGS: The heart and mediastinal contours are unchanged. Interval worsening of diffuse patchy airspace opacities within the right lung. No pulmonary edema. No pleural effusion. No pneumothorax. No acute osseous abnormality. IMPRESSION: Interval worsening of diffuse patchy airspace opacities within the right lung. Electronically Signed   By: Tish Frederickson M.D.   On: 10/14/2022 01:51   DG Chest Port 1  View  Result Date: 10/13/2022 CLINICAL DATA:  Pain. EXAM: PORTABLE CHEST 1 VIEW COMPARISON:  Radiograph and CT yesterday FINDINGS: Similar right pleural effusion, the left pleural effusion on CT is not well-defined by radiograph. Ill-defined airspace disease throughout the right lung, similar in radiographic appearance. The cavitary components on CT are not well-defined by radiograph. Stable heart size and mediastinal contours. No pneumothorax. Remote right clavicle fracture. IMPRESSION: Unchanged appearance of the chest with right pleural effusion and ill-defined airspace disease throughout the right lung. Cavitary components on CT are not well-defined by radiograph. Electronically Signed   By: Narda Rutherford M.D.   On: 10/13/2022 23:17   CT Chest W Contrast  Result Date: 10/12/2022 CLINICAL DATA:  Pneumonia suspected EXAM: CT CHEST WITH CONTRAST TECHNIQUE: Multidetector CT imaging of the chest was performed during intravenous contrast administration. RADIATION DOSE REDUCTION: This exam was performed according to the departmental dose-optimization program which includes automated exposure control, adjustment of the mA and/or kV according to patient size and/or use of  iterative reconstruction technique. CONTRAST:  75mL OMNIPAQUE IOHEXOL 300 MG/ML  SOLN COMPARISON:  Multiple priors, most recent chest CT dated September 25, 2022 FINDINGS: Cardiovascular: Unchanged near occlusive pulmonary embolus of the distal right pulmonary artery extending into the interlobar and middle lobe lobar artery. Additional scattered areas of segmental and subsegmental pulmonary embolus seen. Normal heart size. Trace pericardial effusion. Dilated main pulmonary artery, measuring up to 3.2 cm. Elevated RV to LV ratio of 1.6, similar to prior. Mediastinum/Nodes: No enlarged lymph nodes seen in the chest. Thyroid and esophagus are unremarkable. Lungs/Pleura: Central airways are patent. Peripheral wedge-shaped area of consolidation of the  right middle lobe with new associated cavitation, overall decreased in size when compared with the prior exam but increased in density. Additional nodular opacity of the right upper lobe measuring 2.1 x 0.9 cm on series 3, image 61, previously 2.2 x 1.1 cm, decreased in size with new cavitation. Other previously cavitary seen lesions are unchanged when compared with the prior exam. New diffuse ground-glass opacities. New small bilateral pleural effusions. Decreased patchy consolidations of the right upper lobe. Similar patchy consolidations of the right-greater-than-left lung bases. Similar cystic change of the costophrenic angles. Upper Abdomen: No acute abnormality. Musculoskeletal: No chest wall abnormality. No acute or significant osseous findings. IMPRESSION: 1. Unchanged near occlusive pulmonary embolus of the right interlobar artery and occlusive thrombus of the right middle lobe lobar artery. Additional scattered bilateral areas of segmental and subsegmental pulmonary emboli again seen, similar to prior. 2. Elevated RV to LV ratio, similar to prior exam, consistent with right heart dysfunction. 3. New diffuse ground-glass opacities and small bilateral pleural effusions, likely due to pulmonary edema. 4. Multiple cavitary lesions again seen. A right middle lobe consolidation and right upper lobe nodular opacity demonstrates new cavitation. Other previously cavitary seen lesions are unchanged when compared with the prior exam. Differential considerations include, evolving pulmonary infarcts, septic emboli, atypical infection, or vasculitis such as GPA. 5. Decreased patchy consolidations of the right upper lobe. Similar patchy consolidations of the right-greater-than-left lung bases. Findings are likely due to infectious or inflammatory etiology. 6. Similar cystic change of the costophrenic angles. Consider dedicated ILD protocol CT after resolution of acute symptoms for better evaluation. Electronically Signed    By: Allegra Lai M.D.   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 Central cavitation area in the right mid lung is better demonstrated by comparison chest CT. Small right effusion suspected blunting the right costophrenic angle. Heart is enlarged. No superimposed edema pattern. Negative for pneumothorax. Trachea midline. Old right mid clavicle fracture noted. IMPRESSION: 1. Worsening diffuse right lung and left lower lobe airspace process concerning for multifocal pneumonia. 2. Small right effusion. 3. Cardiomegaly without CHF. Electronically Signed   By: Judie Petit.  Shick M.D.   On: 10/12/2022 10:45     Medications:    sodium chloride 250 mL (10/14/22 0455)   albumin human     ceFEPime (MAXIPIME) IV 2 g (10/14/22 0648)   heparin 800 Units/hr (10/14/22 0819)   magnesium sulfate bolus IVPB 4 g (10/14/22 0813)   norepinephrine (LEVOPHED) Adult infusion 2 mcg/min (10/14/22 0313)   [START ON 10/15/2022] vancomycin     vancomycin      furosemide  20 mg Intravenous Daily  insulin aspart  0-20 Units Subcutaneous Q4H   [START ON 10/15/2022] methylPREDNISolone (SOLU-MEDROL) injection  40 mg Intravenous Daily   docusate sodium, ipratropium-albuterol, ondansetron (ZOFRAN) IV, polyethylene glycol  Assessment/ Plan:  [name] is a 22 y.o.  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 Most recent urine with 2.3 grams of proteinuria on 09/20/2022. With hypoalbuminemia.  Baseline creatinine 1.08  Patient currently prescribed tacrolimus and prednisone outpatient.  Tacrolimus level pending. Due to possible infection, continue to hold immunosuppressives.    Pulmonary embolism: with hypoalbuminemia and nephrotic syndrome.  Was discharged home subcu Lovenox and Coumadin Heparin drip In place    Hypotension, possibly secondary to infection.  Levophed infusing.   Secondary Hyperparathyroidism: not currently on an activated vitamin D.  Corrected calcium 8.8.  5.  Diabetes mellitus, type 1.  Not well-controlled.  With chronic kidney disease.  A1c 9.1 on 08/25/2022.  Primary team to manage sliding scale insulin.   LOS: 0   4/13/20249:34 AM

## 2022-10-14 NOTE — Progress Notes (Signed)
PHARMACY CONSULT NOTE - FOLLOW UP  Pharmacy Consult for Electrolyte Monitoring and Replacement   Recent Labs: Potassium (mmol/L)  Date Value  10/14/2022 4.0   Magnesium (mg/dL)  Date Value  99/35/7017 1.4 (L)   Calcium (mg/dL)  Date Value  79/39/0300 6.8 (L)   Albumin (g/dL)  Date Value  92/33/0076 <1.5 (L)   Phosphorus (mg/dL)  Date Value  22/63/3354 4.0   Sodium (mmol/L)  Date Value  10/14/2022 132 (L)     Assessment: 4/12:  K = 3.4           Ca = 7.3,  Alb = 1.5 ,  Corrected Ca = 9.3  4/13:  K @ 0320 = 4.0 -  this K level was drawn BEFORE any KCl IV was given   Goal of Therapy:  Electrolytes WNL   Plan:  4/13:  K @ 0320 = 4.0 - K is now WNL without any KCl being given ,  will d/c previous orders for KCl 10 mEq IV X 2. - will recheck electrolytes on 4/14 with AM labs.   Scherrie Gerlach ,PharmD Clinical Pharmacist 10/14/2022 6:25 AM

## 2022-10-14 NOTE — Progress Notes (Signed)
PHARMACY CONSULT NOTE - FOLLOW UP  Pharmacy Consult for Electrolyte Monitoring and Replacement   Recent Labs: Potassium (mmol/L)  Date Value  10/13/2022 3.4 (L)   Magnesium (mg/dL)  Date Value  16/04/9603 1.6 (L)   Calcium (mg/dL)  Date Value  54/03/8118 7.3 (L)   Albumin (g/dL)  Date Value  14/78/2956 <1.5 (L)   Phosphorus (mg/dL)  Date Value  21/30/8657 3.2   Sodium (mmol/L)  Date Value  10/13/2022 133 (L)     Assessment: 4/12:  K = 3.4           Ca = 7.3,  Alb = 1.5 ,  Corrected Ca = 9.3  Goal of Therapy:  Electrolytes WNL   Plan:  Will order KCl 10 mEq IV X 2 and recheck electrolytes on 4/13 with AM Labs.   Scherrie Gerlach ,PharmD Clinical Pharmacist 10/14/2022 3:24 AM

## 2022-10-14 NOTE — Progress Notes (Signed)
An USGPIV (ultrasound guided PIV) has been placed for short-term vasopressor infusion. A correctly placed ivWatch must be used when administering Vasopressors. Should this treatment be needed beyond 72 hours, central line access should be obtained.  It will be the responsibility of the bedside nurse to follow best practice to prevent extravasations.  USPIV placed, marked for Ivwatch,ED RN aware.

## 2022-10-14 NOTE — Progress Notes (Signed)
eLink Physician-Brief Progress Note Patient Name: Catherine [name] DOB: 12-Oct-2000 MRN: [id number]   Date of Service  10/14/2022  HPI/Events of Note  Patient admitted with hemoptysis and feeling dizzy, work up positive for pulmonary infiltrates,  hyperglycemia and acute kidney injury.  eICU Interventions  New Patient Evaluation.        Migdalia Dk 10/14/2022, 4:47 AM

## 2022-10-14 NOTE — H&P (Addendum)
NAME:  Catherine [name], MRN:  [id number], DOB:  2000-07-24, LOS: 0 ADMISSION DATE:  10/13/2022, CONSULTATION DATE: 10/14/2022 REFERRING MD: Farrell Ours,, CHIEF COMPLAINT: Hemoptysis   HPI  22 y.o female with significant multiple PMH of uncontrolled TIDM, CKD stage IIIa with minimal-change disease and nephrotic syndrome, secondary hyperparathyroidism, hypoalbuminemia, HLD, HTN, polysubstance abuse (tobacco abuse, marijuana use), recent pulmonary embolism with right heart strain and multiple pulmonary infarcts, and cavitary lesions who presented to the ED  on 10/13/22 with chief complaints of hemoptysis and dizziness.  On review of her chart, patient has had multiple admissions and ED visit for multiple medical issues.  From 06/30/2022 to 07/05/2022 patient was admitted at [facility] due to AKI on CKD, proteinuria, hypoalbuminemia.  Ultrasound DVT was negative patient started on Eliquis for prophylactic anticoagulation due to concerns for hypercoagulability secondary to hypoalbuminemia/nephrotic syndrome.  Patient apparently ran out of Eliquis and was admitted between 08/22/2022 to 08/25/2022 with acute submassive PE with right heart strain as well as cavitary lesions.  Patient previously received rituximab with QuantiFERON gold performed on 07/05/2022 which was negative for.  She was also started on Bactrim for prophylaxis.  Patient underwent thrombolysis and mechanical thrombectomy on 08/22/2022.  On 08/30/2022, patient returned to ED with shortness of breath given repeat CT chest angiogram showed interval development of consolidation and airspace disease and bulky bilateral pulmonary embolism with occlusion in the right middle lobe suggesting infarct she was restarted on heparin drip and was transitioned to Lovenox 1 mg per cake twice daily bridging to Coumadin Coumadin with INR goal of 2-3.  Patient was seen by ID for pulmonary infiltrates/cavitary lesions and pulmonologist who did not recommend bronchoscopy at  that time due to high risk. Patient followed up with pulmonologist on 09/12/2022 who recommended CT chest high-resolution for further evaluation.  Patient was seen in the ED again on 09/20/2022, 09/25/2022 and 10/12/22 with hemoptysis and hyperglycemia and lower extremity edema.   ED Course: Initial vital signs showed HR of 86 beats/minute, BP 94/53 mm Hg, the RR 22 breaths/minute, and the oxygen saturation 100% on RA  and a temperature of 98.33F (36.7C).   Pertinent Labs/Diagnostics Findings: Na+/ K+: 133/3.4 glucose: 214 BUN/Cr.:  34/1.29 albumin < 1.5 WBC:19.0 Hgb/Hct: 13.0/40.6  PCT: negative <0.10 Lactic acid:1.6 COVID PCR: Negative, Troponin: 37 BNP: 1182.8 CXR> right pleural effusion and ill-defined airspace disease throughout the right lung  Patient given 1.5L of fluids and started on broad-spectrum abx for suspected sepsis due to elevated white count and abnormal chest x-ray.  Repeat chest x-ray showed worsening diffuse patchy airspace opacity.  Patient received IV Lasix for possible volume overload.  Patient remained hypotensive and therefore was started on Levophed. PCCM consulted.  Past Medical History  uncontrolled TIDM, CKD stage IIIa with minimal-change disease and nephrotic syndrome, secondary hyperparathyroidism, hypoalbuminemia, HLD, HTN, polysubstance abuse (tobacco abuse, marijuana use), recent pulmonary embolism with right heart strain and multiple pulmonary infarcts, and cavitary lesions   Significant Hospital Events   4/13:Admit to ICU with hypotension due to suspected sepsis vs Pulmonary Hemorrhage   Consults:  ID Nephrology  Procedures:  None  Significant Diagnostic Tests:  4/13: Chest Xray> 4/13: Noncontrast CT head> no acute intracranial abnormality 4/13: High resolution CT Chest>  Interim History / Subjective:  -  Micro Data:  4/13: SARS-CoV-2 PCR> negative 4/13: Influenza PCR> negative 4/13: Blood culture x2> 4/13: Urine Culture> 4/13: MRSA PCR>>   4/13: Strep pneumo urinary antigen> 4/13: Legionella urinary antigen>  Antimicrobials:  Azithromycin  4/13> Ceftriaxone 4/13>  OBJECTIVE  Blood pressure 96/64, pulse 96, temperature 98 F (36.7 C), temperature source Oral, resp. rate 18, height  (1.499 m), weight 54.5 kg, SpO2 100 %.       No intake or output data in the 24 hours ending 10/14/22 0402 Filed Weights   10/13/22 2257  Weight: 54.5 kg   Physical Examination  GENERAL: 22 year-old critically ill patient lying in the bed in no acute distress EYES: PEERLA. No scleral icterus. Extraocular muscles intact.  HEENT: Head atraumatic, normocephalic. Oropharynx and nasopharynx clear.  NECK:  No JVD, supple  LUNGS: Decreased breath sounds bilaterally.  No use of accessory muscles of respiration.  CARDIOVASCULAR: S1, S2 normal. No murmurs, rubs, or gallops.  ABDOMEN: Soft, NTND EXTREMITIES: Bilateral trace edema in lower extremity without erythema.  Capillary refill is less than 3 seconds in all extremities. Pulses palpable distally. NEUROLOGIC: The patient is alert and oriented x 3.  No acute focal neurologic deficit. cranial nerves are intact.  SKIN: No obvious rash, lesion, or ulcer. Warm to touch Labs/imaging that I havepersonally reviewed  (right click and "Reselect all SmartList Selections" daily)     Labs   CBC: Recent Labs  Lab 10/12/22 0847 10/13/22 2314  WBC 17.1* 19.0*  NEUTROABS  --  12.8*  HGB 13.2 13.0  HCT 42.0 40.6  MCV 84.7 84.4  PLT 533* 349    Basic Metabolic Panel: Recent Labs  Lab 10/12/22 0847 10/13/22 2314  NA 133* 133*  K 3.6 3.4*  CL 109 100  CO2 17* 22  GLUCOSE 185* 214*  BUN 48* 34*  CREATININE 1.44* 1.29*  CALCIUM 7.4* 7.3*   GFR: Estimated Creatinine Clearance: 51.5 mL/min (A) (by C-G formula based on SCr of 1.29 mg/dL (H)). Recent Labs  Lab 10/12/22 0847 10/13/22 2314 10/14/22 0247  PROCALCITON <0.10 <0.10  --   WBC 17.1* 19.0*  --   LATICACIDVEN  --   --  1.6     Liver Function Tests: Recent Labs  Lab 10/12/22 0847 10/13/22 2314  AST 18 18  ALT 11 12  ALKPHOS 113 115  BILITOT 0.4 0.6  PROT 4.2* 4.7*  ALBUMIN <1.5* <1.5*   Recent Labs  Lab 10/13/22 2314  LIPASE 27   No results for input(s): "AMMONIA" in the last 168 hours.  ABG    Component Value Date/Time   PHART 7.10 (LL) 07/29/2019 0321   PCO2ART <19.0 (LL) 07/29/2019 0321   PO2ART 164 (H) 07/29/2019 0321   HCO3 15.7 (L) 09/25/2022 1304   ACIDBASEDEF 10.4 (H) 09/25/2022 1304   O2SAT 49.6 09/25/2022 1304     Coagulation Profile: Recent Labs  Lab 10/12/22 0805 10/13/22 2314  INR 1.8* 1.5*    Cardiac Enzymes: No results for input(s): "CKTOTAL", "CKMB", "CKMBINDEX", "TROPONINI" in the last 168 hours.  HbA1C: Hgb A1c MFr Bld  Date/Time Value Ref Range Status  08/25/2022 04:04 AM 9.1 (H) 4.8 - 5.6 % Final    Comment:    (NOTE) Pre diabetes:          5.7%-6.4%  Diabetes:              >6.4%  Glycemic control for   <7.0% adults with diabetes   09/04/2021 01:06 PM 13.1 (H) 4.8 - 5.6 % Final    Comment:    (NOTE) **Verified by repeat analysis**         Prediabetes: 5.7 - 6.4         Diabetes: >6.4  Glycemic control for adults with diabetes: <7.0     CBG: Recent Labs  Lab 10/13/22 2308  GLUCAP 216*    Review of Systems  Constitutional:  Positive for chills, fever and malaise/fatigue. Negative for diaphoresis and weight loss.  HENT: Negative.    Eyes: Negative.   Respiratory:  Positive for hemoptysis, shortness of breath and wheezing.   Cardiovascular:  Positive for leg swelling.  Gastrointestinal:  Positive for abdominal pain, constipation, nausea and vomiting.  Genitourinary: Negative.   Musculoskeletal: Negative.   Skin: Negative.   Neurological:  Positive for dizziness.  Endo/Heme/Allergies: Negative.   Psychiatric/Behavioral: Negative.       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   Surgical History    Past Surgical History:  Procedure Laterality Date   PULMONARY THROMBECTOMY Bilateral 08/22/2022   Procedure: PULMONARY THROMBECTOMY;  Surgeon: Annice Needy, MD;  Location: [facility] INVASIVE CV LAB;  Service: Cardiovascular;  Laterality: Bilateral;     Social History   reports that she quit smoking about 3 months ago. Her smoking use included e-cigarettes. She has never used smokeless tobacco. She reports that she does not currently use alcohol. She reports current drug use. Drug: Marijuana.   Family History   Her family history is not on file.   Allergies No Known Allergies   Home Medications  Prior to Admission medications   Medication Sig Start Date End Date Taking? Authorizing Provider  acetaminophen (TYLENOL) 325 MG tablet Take 2 tablets (650 mg total) by mouth every 6 (six) hours as needed for mild pain or headache. 09/05/22  Yes Djan, Scarlette Calico, MD  benzonatate (TESSALON PERLES) 100 MG capsule Take 1 capsule (100 mg total) by mouth 3 (three) times daily as needed for cough. 08/30/22  Yes Loleta Rose, MD  calcitRIOL (ROCALTROL) 0.25 MCG capsule Take 0.25 mcg by mouth as directed. Take 1 capsule (0.25 mcg) three times a week. 09/20/22  Yes [provider]  chlorpheniramine-HYDROcodone (TUSSIONEX) 10-8 MG/5ML Take 5 mLs by mouth every 12 (twelve) hours as needed for cough. 08/30/22  Yes Loleta Rose, MD  glucagon 1 MG injection Inject 1 mg into the muscle as needed. 02/26/19  Yes [provider]  insulin aspart (NOVOLOG) 100 UNIT/ML injection Inject 5-8 Units into the skin 3 (three) times daily before meals. 09/06/21 10/14/22 Yes Darlin Priestly, MD  ketorolac (TORADOL) 10 MG tablet Take 1 tablet (10 mg total) by mouth every 6 (six) hours as needed. 09/25/22  Yes Merwyn Katos, MD  LANTUS 100 UNIT/ML  injection Inject 0.15 mLs (15 Units total) into the skin at bedtime. 09/05/22 12/04/22 Yes DjanScarlette Calico, MD  Multiple Vitamin (MULTIVITAMIN WITH MINERALS) TABS tablet Take 1 tablet by mouth daily. 08/25/22  Yes Darlin Priestly, MD  oxyCODONE (ROXICODONE) 5 MG immediate release tablet Take 1 tablet (5 mg total) by mouth every 8 (eight) hours as needed for up to 2 days. 10/12/22 10/14/22 Yes Georga Hacking, MD  tacrolimus (PROGRAF) 1 MG capsule Take 2 capsules (2 mg total) by mouth 2 (two) times daily. 08/25/22 11/23/22 Yes Darlin Priestly, MD  torsemide (DEMADEX) 20 MG tablet Take 1 tablet (20 mg total) by mouth daily. 10/12/22 11/11/22 Yes Georga Hacking, MD  Vitamin D, Ergocalciferol, (DRISDOL) 1.25 MG (50000 UNIT) CAPS capsule Take 50,000 Units by mouth every 7 (seven) days. 07/10/22 07/10/23 Yes  [provider]  warfarin (COUMADIN) 5 MG tablet Take 1 tablet (5 mg total) by mouth daily. 10/05/22  Yes Rickard Patience, MD  blood glucose meter kit and supplies Dispense based on patient and insurance preference. Use up to four times daily as directed. (FOR ICD-10 E10.9, E11.9). 09/06/21   Darlin Priestly, MD  enoxaparin (LOVENOX) 60 MG/0.6ML injection Inject 0.575 mLs (57.5 mg total) into the skin every 12 (twelve) hours. Patient not taking: Reported on 10/14/2022 09/18/22   Rickard Patience, MD  feeding supplement, GLUCERNA SHAKE, (GLUCERNA SHAKE) LIQD Take 237 mLs by mouth 2 (two) times daily between meals. 08/25/22   Darlin Priestly, MD  sulfamethoxazole-trimethoprim (BACTRIM DS) 800-160 MG tablet Take 1 tablet by mouth every Monday, Wednesday, and Friday. Patient not taking: Reported on 10/14/2022 08/25/22 11/23/22  Darlin Priestly, MD  torsemide (DEMADEX) 20 MG tablet Take 1 tablet (20 mg total) by mouth daily. 08/25/22 09/24/22  Darlin Priestly, MD  Scheduled Meds: Continuous Infusions:  sodium chloride 250 mL (10/14/22 0455)   azithromycin 500 mg (10/14/22 0457)   norepinephrine (LEVOPHED) Adult infusion 2 mcg/min (10/14/22 0313)   potassium chloride      potassium chloride     PRN Meds:.docusate sodium, polyethylene glycol   Active Hospital Problem list     Assessment & Plan:   #Sepsis due to suspected ?Pneumonia Suspect leukocytosis due to PE/pulmonary infarcts and steroids given negative Pro-Cal Prior CT showed cavitary lesions of upper lobes right greater than left.QuantiFERON gold performed on 07/05/2022 negative -Obtain high-resolution CT chest for further -F/u cultures, strep pneumo and Legionella, trend lactic/ PCT -Monitor WBC/ fever curve -Start broad spectrum abx pending cultures and ID evaluation -IVF hydration as needed -Pressors for MAP goal >65 -Strict I/O's -ID consult  #Pulmonary Embolism with recurrent Hemoptysis S/p thrombolysis and thrombectomy 08/22/22 recent CT on 4/11 re demonstrated near close if PE of the right lung with elevated RV to LV ratio and new diffuse groundglass opacities and small bilateral pleural effusion, and multiple cavitary lesions -Supplemental oxygen as needed, to maintain SpO2 > 90% -INR 1.5, was taking Lovenox and Coumadin due to failure on Eliquis  -High resolution CT chest pending -Start Heparin gtt -PRN Bronchodilators  -Continue steroids -Smoking Cessation counseling    #Sepsis due to Suspected Pneumonia Initial interventions/workup included: 2 L of NS/LR & Cefepime/ Vancomycin/ Azithromycin -F/u cultures, trend lactic/ PCT -Monitor WBC/ fever curve -IV antibiotics Ceftriaxone AND Azithromycin -Gentle IVF hydration as needed -Pressors PRN for MAP goal >65 -Strict I/O's  #AKI on CKD I Proteinuria I Hx of Nephrotic Syndrome w/ Minimal Change Disease  Baseline creatinine 0.64, Cr on admission 1.29 slighty improved from 1.48 on 09/05/22 potential pre-renal AKI in the setting of hypotension  From chart review it appears that she has received Cytoxan, CellCept, and Rituximab in the past.  -Hold home tacrolimus, check tac trough in AM  -Hold diuretics for now (s/p 1 L LR in ED)  - takes Torsemide PRN at home- -Prior Renal ultrasound on w/ mild bilateral renal pelviectasis and caliectasis -On Prednisone at home. Has required steroid tapers in the past (self-tapers by checking urine dipstick at home), will continue daily for now pending Nephro input -Daily renal function panel and Mg  -Monitor strict I&O -Daily weights -Avoid nephrotoxic agents  -Nephrology consult  #Poorly Controlled Type 1 DM:  Diagnosed at age 26. Complicated by minimal change disease on tacrolimus and PRN prednisone for flares  Last A1c on . Follows with [facility].  -Repeat A1c -  Resume home Lantus 30 units nightly  -SSI   Chronic Problems HTN: not currently on medication at home  HLD: no longer taking Atorvastatin    Best practice:  Diet:  NPO Pain/Anxiety/Delirium protocol (if indicated): No VAP protocol (if indicated): Not indicated DVT prophylaxis: Systemic AC GI prophylaxis: H2B Glucose control:  SSI Yes Central venous access:  N/A Arterial line:  N/A Foley:  N/A Mobility:  bed rest  PT consulted: N/A Last date of multidisciplinary goals of care discussion [4/13] Code Status:  full code Disposition: Icu   = Goals of Care = Code Status Order: FULL  Primary Emergency Contact: HATFIELD,TANYA, Home Phone: [phone] Wishes to pursue full aggressive treatment and intervention options, including CPR and intubation, but goals of care will be addressed on going with patient and Family  Critical care time: 45 minutes        Webb Silversmith DNP, CCRN, FNP-C, AGACNP-BC Acute Care & Family Nurse Practitioner [facility] PCCM on call pager [phone]

## 2022-10-14 NOTE — Progress Notes (Signed)
PHARMACY NOTE:  ANTIMICROBIAL RENAL DOSAGE ADJUSTMENT  Current antimicrobial regimen includes a mismatch between antimicrobial dosage and estimated renal function.  As per policy approved by the Pharmacy & Therapeutics and Medical Executive Committees, the antimicrobial dosage will be adjusted accordingly.  Current antimicrobial dosage:  Cefepime 2 g IV q8H  Indication: Sepsis  Renal Function:  Estimated Creatinine Clearance: 57.1 mL/min (A) (by C-G formula based on SCr of 1.22 mg/dL (H)).    Antimicrobial dosage has been changed to:  Cefepime 2 g IV q12H  Additional comments:   Thank you for allowing pharmacy to be a part of this patient's care.  Orson Aloe, [facility] 10/14/2022 9:55 AM

## 2022-10-14 NOTE — Progress Notes (Signed)
ANTICOAGULATION CONSULT NOTE   Pharmacy Consult for Heparin  Indication: pulmonary embolus  No Known Allergies  Patient Measurements: Height: 4\' 11"  (149.9 cm) Weight: 60.2 kg (132 lb 11.5 oz) IBW/kg (Calculated) : 43.2 Heparin Dosing Weight: 55.9 kg   Vital Signs: Temp: 97.9 F (36.6 C) (04/13 2030) Temp Source: Oral (04/13 2030) BP: 91/67 (04/13 2102) Pulse Rate: 85 (04/13 2102)  Labs: Recent Labs    10/12/22 0805 10/12/22 0847 10/12/22 0847 10/13/22 2314 10/14/22 0056 10/14/22 0320 10/14/22 0457 10/14/22 1346 10/14/22 2058  HGB  --  13.2   < > 13.0  --   --  12.6  --   --   HCT  --  42.0  --  40.6  --   --  40.3  --   --   PLT  --  533*  --  349  --   --  330  --   --   APTT  --   --   --   --   --   --  30  --   --   LABPROT 20.6*  --   --  17.9*  --   --  18.1*  --   --   INR 1.8*  --   --  1.5*  --   --  1.5*  --   --   HEPARINUNFRC  --   --   --   --   --   --   --  0.14* 0.25*  CREATININE  --  1.44*  --  1.29*  --  1.22*  --   --   --   TROPONINIHS  --   --   --  37* 36*  --   --   --   --    < > = values in this interval not displayed.     Estimated Creatinine Clearance: 57.1 mL/min (A) (by C-G formula based on SCr of 1.22 mg/dL (H)).   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 20 tablet 0 prn   benzonatate (TESSALON PERLES) 100 MG capsule Take 1 capsule (100 mg total) by mouth 3 (three) times daily as needed for cough. 30 capsule 1 prn   calcitRIOL (ROCALTROL) 0.25 MCG capsule Take 0.25 mcg by mouth as directed. Take 1  capsule (0.25 mcg) three times a week.   10/11/2022   chlorpheniramine-HYDROcodone (TUSSIONEX) 10-8 MG/5ML Take 5 mLs by mouth every 12 (twelve) hours as needed for cough. 115 mL 0 prm   glucagon 1 MG injection Inject 1 mg into the muscle as needed.   prn   insulin aspart (NOVOLOG) 100 UNIT/ML injection Inject 5-8 Units into the skin 3 (three) times daily before meals. 7.2 mL 2 10/13/2022   ketorolac (TORADOL) 10 MG tablet Take 1 tablet (10 mg total) by mouth every 6 (six) hours as needed. 20 tablet 0 prn   LANTUS 100 UNIT/ML injection Inject 0.15 mLs (15 Units total) into the skin at bedtime. 4.5 mL 2 10/13/2022   Multiple Vitamin (MULTIVITAMIN  WITH MINERALS) TABS tablet Take 1 tablet by mouth daily.   10/13/2022   oxyCODONE (ROXICODONE) 5 MG immediate release tablet Take 1 tablet (5 mg total) by mouth every 8 (eight) hours as needed for up to 2 days. 6 tablet 0 prn   tacrolimus (PROGRAF) 1 MG capsule Take 2 capsules (2 mg total) by mouth 2 (two) times daily. 120 capsule 2 10/13/2022   torsemide (DEMADEX) 20 MG tablet Take 1 tablet (20 mg total) by mouth daily. 30 tablet 0 10/13/2022   Vitamin D, Ergocalciferol, (DRISDOL) 1.25 MG (50000 UNIT) CAPS capsule Take 50,000 Units by mouth every 7 (seven) days.   Past Week   warfarin (COUMADIN) 5 MG tablet Take 1 tablet (5 mg total) by mouth daily. 30 tablet 2 10/13/2022 at 1900   blood glucose meter kit and supplies Dispense based on patient and insurance preference. Use up to four times daily as directed. (FOR ICD-10 E10.9, E11.9). 1 each 0    enoxaparin (LOVENOX) 60 MG/0.6ML injection Inject 0.575 mLs (57.5 mg total) into the skin every 12 (twelve) hours. (Patient not taking: Reported on 10/14/2022) 14 mL 0 Not Taking   feeding supplement, GLUCERNA SHAKE, (GLUCERNA SHAKE) LIQD Take 237 mLs by mouth 2 (two) times daily between meals.  0    sulfamethoxazole-trimethoprim (BACTRIM DS) 800-160 MG tablet Take 1 tablet by mouth every Monday, Wednesday, and Friday.  (Patient not taking: Reported on 10/14/2022) 12 tablet 2 Completed Course   torsemide (DEMADEX) 20 MG tablet Take 1 tablet (20 mg total) by mouth daily. 30 tablet 0     Assessment: Pharmacy consulted to dose heparin for PE in this 22 year old female admitted with sepsis.  Per PTA med list, pt was on lovenox 57.5 mg SQ Q12H at some point but was no longer taking.  Pt was on Warfarin 5 mg PO daily,  last dose on 4/12 @ 1900.   4/13 1346 HL 0.14  4/13 2117 HL 0.25   Goal of Therapy:  Heparin level 0.3-0.7 units/ml Monitor platelets by anticoagulation protocol: Yes   Plan: Heparin level is subtherapeutic.  Will give heparin bolus 1000 units x 1 and increase the heparin infusion to 1050 units/hr.  Recheck heparin level in 6 hours.  CBC daily while on heparin.   Elliot Gurney, PharmD, BCPS Clinical Pharmacist  10/14/2022 9:19 PM

## 2022-10-15 ENCOUNTER — Inpatient Hospital Stay (HOSPITAL_COMMUNITY)
Admit: 2022-10-15 | Discharge: 2022-10-15 | Disposition: A | Discharge status: Home / Self Care | Payer: Medicaid Other | Attending: Student | Admitting: Student

## 2022-10-15 DIAGNOSIS — A419 Sepsis, unspecified organism: Secondary | ICD-10-CM | POA: Diagnosis not present

## 2022-10-15 DIAGNOSIS — I5031 Acute diastolic (congestive) heart failure: Secondary | ICD-10-CM | POA: Diagnosis not present

## 2022-10-15 DIAGNOSIS — I959 Hypotension, unspecified: Secondary | ICD-10-CM | POA: Diagnosis not present

## 2022-10-15 LAB — RESPIRATORY PANEL BY PCR

## 2022-10-15 LAB — GLUCOSE, CAPILLARY
Glucose-Capillary: 209 mg/dL — ABNORMAL HIGH (ref 70–99)
Glucose-Capillary: 109 mg/dL — ABNORMAL HIGH (ref 70–99)
Glucose-Capillary: 152 mg/dL — ABNORMAL HIGH (ref 70–99)
Glucose-Capillary: 291 mg/dL — ABNORMAL HIGH (ref 70–99)
Glucose-Capillary: 310 mg/dL — ABNORMAL HIGH (ref 70–99)

## 2022-10-15 LAB — URINE CULTURE: Culture: <10000 — AB

## 2022-10-15 LAB — BASIC METABOLIC PANEL
Anion gap: 7 (ref 5–15)
BUN: 46 mg/dL — ABNORMAL HIGH (ref 6–20)
CO2: 18 mmol/L — ABNORMAL LOW (ref 22–32)
Calcium: 7.4 mg/dL — ABNORMAL LOW (ref 8.9–10.3)
Chloride: 107 mmol/L (ref 98–111)
Creatinine, Ser: 1.32 mg/dL — ABNORMAL HIGH (ref 0.44–1.00)
GFR, Estimated: 59 mL/min — ABNORMAL LOW (ref >60)
Glucose, Bld: 226 mg/dL — ABNORMAL HIGH (ref 70–99)
Potassium: 3.7 mmol/L (ref 3.5–5.1)
Sodium: 132 mmol/L — ABNORMAL LOW (ref 135–145)

## 2022-10-15 LAB — CBC
HCT: 34.5 % — ABNORMAL LOW (ref 36.0–46.0)
Hemoglobin: 11.1 g/dL — ABNORMAL LOW (ref 12.0–15.0)
MCH: 26.9 pg (ref 26.0–34.0)
MCHC: 32.2 g/dL (ref 30.0–36.0)
MCV: 83.7 fL (ref 80.0–100.0)
Platelets: 321 10*3/uL (ref 150–400)
RBC: 4.12 MIL/uL (ref 3.87–5.11)
RDW: 15.5 % (ref 11.5–15.5)
WBC: 23.9 10*3/uL — ABNORMAL HIGH (ref 4.0–10.5)
nRBC: 0 % (ref 0.0–0.2)

## 2022-10-15 LAB — HEPARIN LEVEL (UNFRACTIONATED)
Heparin Unfractionated: 0.28 IU/mL — ABNORMAL LOW (ref 0.30–0.70)
Heparin Unfractionated: 0.39 IU/mL (ref 0.30–0.70)
Heparin Unfractionated: 0.31 IU/mL (ref 0.30–0.70)

## 2022-10-15 LAB — CULTURE, BLOOD (ROUTINE X 2)
Culture: NO GROWTH
Culture: NO GROWTH

## 2022-10-15 LAB — PHOSPHORUS: Phosphorus: 4.2 mg/dL (ref 2.5–4.6)

## 2022-10-15 LAB — MAGNESIUM: Magnesium: 2.2 mg/dL (ref 1.7–2.4)

## 2022-10-15 MED ORDER — HEPARIN BOLUS VIA INFUSION
850.0000 [IU] | Freq: Once | INTRAVENOUS | Status: AC
  Administered 2022-10-15: 850 [IU] via INTRAVENOUS
  Filled 2022-10-15: qty 850

## 2022-10-15 MED ORDER — ENSURE ENLIVE PO LIQD
237.0000 mL | Freq: Three times a day (TID) | ORAL | Status: DC
  Administered 2022-10-15 – 2022-10-16 (×3): 237 mL via ORAL

## 2022-10-15 MED ORDER — MENTHOL 3 MG MT LOZG: 1.0000 | LOZENGE | OROMUCOSAL | Status: DC | PRN

## 2022-10-15 MED ORDER — HYDROCOD POLI-CHLORPHE POLI ER 10-8 MG/5ML PO SUER
5.0000 mL | Freq: Two times a day (BID) | ORAL | Status: DC | PRN
  Administered 2022-10-17 – 2022-10-18 (×2): 5 mL via ORAL
  Filled 2022-10-15 (×2): qty 5

## 2022-10-15 MED ORDER — INSULIN GLARGINE-YFGN 100 UNIT/ML ~~LOC~~ SOLN
15.0000 [IU] | Freq: Every day | SUBCUTANEOUS | Status: DC
  Administered 2022-10-15 – 2022-10-16 (×2): 15 [IU] via SUBCUTANEOUS
  Filled 2022-10-15 (×2): qty 0.15

## 2022-10-15 MED ORDER — GUAIFENESIN ER 600 MG PO TB12
600.0000 mg | ORAL_TABLET | Freq: Two times a day (BID) | ORAL | Status: DC
  Administered 2022-10-15 – 2022-10-18 (×8): 600 mg via ORAL
  Filled 2022-10-15 (×8): qty 1

## 2022-10-15 MED ORDER — BENZONATATE 100 MG PO CAPS
100.0000 mg | ORAL_CAPSULE | Freq: Three times a day (TID) | ORAL | Status: DC | PRN
  Administered 2022-10-15 – 2022-10-17 (×3): 100 mg via ORAL
  Filled 2022-10-15 (×3): qty 1

## 2022-10-15 NOTE — Progress Notes (Signed)
ANTICOAGULATION CONSULT NOTE   Pharmacy Consult for Heparin  Indication: pulmonary embolus  No Known Allergies  Patient Measurements: Height: 4\' 11"  (149.9 cm) Weight: 62.9 kg (138 lb 10.7 oz) IBW/kg (Calculated) : 43.2 Heparin Dosing Weight: 55.9 kg   Vital Signs: Temp: 97.9 F (36.6 C) (04/14 1155) Temp Source: Axillary (04/14 1155) BP: 89/71 (04/14 1600) Pulse Rate: 87 (04/14 1600)  Labs: Recent Labs    10/13/22 2314 10/14/22 0056 10/14/22 0320 10/14/22 0457 10/14/22 1346 10/15/22 0256 10/15/22 0948 10/15/22 1548  HGB 13.0  --   --  12.6  --  11.1*  --   --   HCT 40.6  --   --  40.3  --  34.5*  --   --   PLT 349  --   --  330  --  321  --   --   APTT  --   --   --  30  --   --   --   --   LABPROT 17.9*  --   --  18.1*  --   --   --   --   INR 1.5*  --   --  1.5*  --   --   --   --   HEPARINUNFRC  --   --   --   --    < > 0.28* 0.39 0.31  CREATININE 1.29*  --  1.22*  --   --  1.32*  --   --   TROPONINIHS 37* 36*  --   --   --   --   --   --    < > = values in this interval not displayed.     Estimated Creatinine Clearance: 53.9 mL/min (A) (by C-G formula based on SCr of 1.32 mg/dL (H)).   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 20 tablet 0 prn   benzonatate (TESSALON PERLES) 100 MG capsule Take 1 capsule (100 mg total) by mouth 3 (three) times daily as needed for cough. 30 capsule 1 prn   calcitRIOL (ROCALTROL) 0.25 MCG capsule Take 0.25 mcg by mouth as directed. Take 1 capsule (0.25 mcg) three times a week.   10/11/2022    chlorpheniramine-HYDROcodone (TUSSIONEX) 10-8 MG/5ML Take 5 mLs by mouth every 12 (twelve) hours as needed for cough. 115 mL 0 prm   glucagon 1 MG injection Inject 1 mg into the muscle as needed.   prn   insulin aspart (NOVOLOG) 100 UNIT/ML injection Inject 5-8 Units into the skin 3 (three) times daily before meals. 7.2 mL 2 10/13/2022   ketorolac (TORADOL) 10 MG tablet Take 1 tablet (10 mg total) by mouth every 6 (six) hours as needed. 20 tablet 0 prn   LANTUS 100 UNIT/ML injection Inject 0.15 mLs (15 Units total) into the skin at bedtime. 4.5 mL 2 10/13/2022   Multiple Vitamin (MULTIVITAMIN WITH MINERALS) TABS tablet Take 1 tablet by mouth daily.   10/13/2022   [EXPIRED] oxyCODONE (ROXICODONE) 5 MG immediate release tablet Take 1 tablet (5  mg total) by mouth every 8 (eight) hours as needed for up to 2 days. 6 tablet 0 prn   tacrolimus (PROGRAF) 1 MG capsule Take 2 capsules (2 mg total) by mouth 2 (two) times daily. 120 capsule 2 10/13/2022   torsemide (DEMADEX) 20 MG tablet Take 1 tablet (20 mg total) by mouth daily. 30 tablet 0 10/13/2022   Vitamin D, Ergocalciferol, (DRISDOL) 1.25 MG (50000 UNIT) CAPS capsule Take 50,000 Units by mouth every 7 (seven) days.   Past Week   warfarin (COUMADIN) 5 MG tablet Take 1 tablet (5 mg total) by mouth daily. 30 tablet 2 10/13/2022 at 1900   blood glucose meter kit and supplies Dispense based on patient and insurance preference. Use up to four times daily as directed. (FOR ICD-10 E10.9, E11.9). 1 each 0    enoxaparin (LOVENOX) 60 MG/0.6ML injection Inject 0.575 mLs (57.5 mg total) into the skin every 12 (twelve) hours. (Patient not taking: Reported on 10/14/2022) 14 mL 0 Not Taking   feeding supplement, GLUCERNA SHAKE, (GLUCERNA SHAKE) LIQD Take 237 mLs by mouth 2 (two) times daily between meals.  0    sulfamethoxazole-trimethoprim (BACTRIM DS) 800-160 MG tablet Take 1 tablet by mouth every Monday, Wednesday, and Friday. (Patient not taking: Reported on 10/14/2022) 12  tablet 2 Completed Course   torsemide (DEMADEX) 20 MG tablet Take 1 tablet (20 mg total) by mouth daily. 30 tablet 0     Assessment: Pharmacy consulted to dose heparin for PE in this 22 year old female admitted with sepsis.  Per PTA med list, pt was on lovenox 57.5 mg SQ Q12H at some point but was no longer taking.  Pt was on Warfarin 5 mg PO daily,  last dose on 4/12 @ 1900.   4/13 1346 HL 0.14  4/13 2117 HL 0.25 4/14 0256 HL 0.28 4/14 0948 HL 0.39 4/14 1548 HL 0.31   Goal of Therapy:  Heparin level 0.3-0.7 units/ml Monitor platelets by anticoagulation protocol: Yes   Plan: Heparin level is therapeutic x 2.  Will continue heparin infusion at 1150 units/hr. Will transition to daily heparin levels. Next heparin level tomorrow AM CBC daily while on heparin.    Jaynie Bream, PharmD, BCPS Clinical Pharmacist  10/15/2022 4:20 PM

## 2022-10-15 NOTE — Progress Notes (Signed)
  Echocardiogram 2D Echocardiogram has been performed.  Lenor Coffin 10/15/2022, 1:56 PM

## 2022-10-15 NOTE — Progress Notes (Addendum)
[facility] Progress Note  Patient: Catherine [name]    ZOX:[id number]  DOA: 10/13/2022     Date of Service: the patient was seen and examined on 10/15/2022  Chief Complaint  Patient presents with   Dizziness   Brief hospital course: 22 y.o female with significant multiple PMH of uncontrolled TIDM, CKD stage IIIa with minimal-change disease and nephrotic syndrome, secondary hyperparathyroidism, hypoalbuminemia, HLD, HTN, polysubstance abuse (tobacco abuse, marijuana use), recent pulmonary embolism with right heart strain and multiple pulmonary infarcts, and cavitary lesions who presented to the ED  on 10/13/22 with chief complaints of hemoptysis and dizziness.   On review of her chart, patient has had multiple admissions and ED visit for multiple medical issues.  From 06/30/2022 to 07/05/2022 patient was admitted at [facility] due to AKI on CKD, proteinuria, hypoalbuminemia.  Ultrasound DVT was negative patient started on Eliquis for prophylactic anticoagulation due to concerns for hypercoagulability secondary to hypoalbuminemia/nephrotic syndrome.  Patient apparently ran out of Eliquis and was admitted between 08/22/2022 to 08/25/2022 with acute submassive PE with right heart strain as well as cavitary lesions.  Patient previously received rituximab with QuantiFERON gold performed on 07/05/2022 which was negative for.  She was also started on Bactrim for prophylaxis.  Patient underwent thrombolysis and mechanical thrombectomy on 08/22/2022.  On 08/30/2022, patient returned to ED with shortness of breath given repeat CT chest angiogram showed interval development of consolidation and airspace disease and bulky bilateral pulmonary embolism with occlusion in the right middle lobe suggesting infarct she was restarted on heparin drip and was transitioned to Lovenox 1 mg per cake twice daily bridging to Coumadin Coumadin with INR goal of 2-3.  Patient was seen by ID for pulmonary infiltrates/cavitary lesions and  pulmonologist who did not recommend bronchoscopy at that time due to high risk. Patient followed up with pulmonologist on 09/12/2022 who recommended CT chest high-resolution for further evaluation.  Patient was seen in the ED again on 09/20/2022, 09/25/2022 and 10/12/22 with hemoptysis and hyperglycemia and lower extremity edema.   ED Course: Initial vital signs showed HR of 86 beats/minute, BP 94/53 mm Hg, the RR 22 breaths/minute, and the oxygen saturation 100% on RA  and a temperature of 98.55F (36.7C).    Pertinent Labs/Diagnostics Findings: Na+/ K+: 133/3.4 glucose: 214 BUN/Cr.:  34/1.29 albumin < 1.5 WBC:19.0 Hgb/Hct: 13.0/40.6  PCT: negative <0.10 Lactic acid:1.6 COVID PCR: Negative, Troponin: 37 BNP: 1182.8 CXR> right pleural effusion and ill-defined airspace disease throughout the right lung   Patient given 1.5L of fluids and started on broad-spectrum abx for suspected sepsis due to elevated white count and abnormal chest x-ray.  Repeat chest x-ray showed worsening diffuse patchy airspace opacity.  Patient received IV Lasix for possible volume overload.  Patient remained hypotensive and therefore was started on Levophed. PCCM consulted.  Patient was admitted to the ICU on 4/13, Levophed weaned off, currently vital stable.  Transferred to [facility] hospitalist service on 10/15/2022.  Nephrology is following, ID will be consulted, further management as below.   Assessment and Plan: #Sepsis due to multifocal pneumonia Prior CT showed cavitary lesions of upper lobes right greater than left.QuantiFERON gold performed on 07/05/2022 negative 4/13 CT chest: Bilateral pneumonia, right middle lobe masslike lesion could be septic emboli, atypical infection or vasculitis. Small bilateral pleural effusion Blood culture NGTD, strep pneumo antigen negative, S/p Levophed in the ICU weaned off, vital signs stable now S/p 2 L of NS/LR & Cefepime/ Vancomycin/ Azithromycin F/u Legionella,  Monitor WBC/ fever  curve  continue cefepime 2 g IV every 12 hourly -IVF hydration as needed Follow ID consult Follow 2D echocardiogram to rule out endocarditis    #Pulmonary Embolism with recurrent Hemoptysis S/p thrombolysis and thrombectomy 08/22/22 recent CT on 4/11 re demonstrated near close if PE of the right lung with elevated RV to LV ratio and new diffuse groundglass opacities and small bilateral pleural effusion, and multiple cavitary lesions -Supplemental oxygen as needed, to maintain SpO2 > 90% -INR 1.5, was taking Lovenox and Coumadin due to failure on Eliquis  -continue Heparin gtt, transition to Coumadin when stable -PRN Bronchodilators  -Continue steroids -Smoking Cessation counseling      #AKI on CKD I Proteinuria I Hx of Nephrotic Syndrome w/ Minimal Change Disease  Baseline creatinine 0.64, Cr on admission 1.29 slighty improved from 1.48 on 09/05/22 potential pre-renal AKI in the setting of hypotension From chart review it appears that she has received Cytoxan, CellCept, and Rituximab in the past.  -Hold home tacrolimus, check tac trough is pending -s/p 1 L LR in ED, takes Torsemide PRN at home -Prior Renal ultrasound on w/ mild bilateral renal pelviectasis and caliectasis -On Prednisone at home. Has required steroid tapers in the past (self-tapers by checking urine dipstick at home),  -Daily renal function panel and Mg  -Monitor strict I&O -Daily weights -Avoid nephrotoxic agents  -Nephrology consult 4/13 started Lasix 20 mg IV daily  #Poorly Controlled Type 1 DM:  Diagnosed at age 86. Complicated by minimal change disease on tacrolimus and PRN prednisone for flares  Last A1c 9.1. Follows with [facility].  Started Semglee 15 units nightly, continue NovoLog sliding scale, continue diabetic diet, monitor CBG. ( home dose Lantus 30 units nightly??)    Chronic Problems HTN: not currently on medication at home  HLD: no longer taking Atorvastatin   Body mass index is 28.01  kg/m.  Interventions:       Diet: Carb modified diet DVT Prophylaxis: Therapeutic Anticoagulation with heparin IV infusion    Advance goals of care discussion: Full code  Family Communication: family was not present at bedside, at the time of interview.  The pt provided permission to discuss medical plan with the family. Opportunity was given to ask question and all questions were answered satisfactorily.   Disposition:  Pt is from Home, admitted with sepsis, PNA, still has resp failure, SOb, which precludes a safe discharge. Discharge to Home, when clinically stable.  Subjective: No significant events overnight, patient still feels short of breath and having dry cough, no any chest pain or palpitation, no any other active issues.  Physical Exam: General: NAD, lying comfortably, mild shortness of breath Appear in no distress, affect appropriate Eyes: PERRLA ENT: Oral Mucosa Clear, moist  Neck: no JVD,  Cardiovascular: S1 and S2 Present, no Murmur,  Respiratory: Good air entry bilaterally, tachypneic, bilateral crackles, no significant wheezing appreciated Abdomen: Bowel Sound present, Soft and no tenderness,  Skin: no rashes Extremities: 2+ Pedal edema, no calf tenderness Neurologic: without any new focal findings Gait not checked due to patient safety concerns  Vitals:   10/15/22 0645 10/15/22 0700 10/15/22 0715 10/15/22 0730  BP:  100/75  91/74  Pulse: 76 75 74 74  Resp: (!) 24 (!) 26 14 (!) 25  Temp:      TempSrc:      SpO2: 98% 97% 100% 99%  Weight:      Height:        Intake/Output Summary (Last 24 hours) at 10/15/2022 1039 Last data  filed at 10/15/2022 0700 Gross per 24 hour  Intake 2616.5 ml  Output 550 ml  Net 2066.5 ml   Filed Weights   10/13/22 2257 10/14/22 0425 10/15/22 0409  Weight: 54.5 kg 60.2 kg 62.9 kg    Data Reviewed: I have personally reviewed and interpreted daily labs, tele strips, imagings as discussed above. I reviewed all nursing  notes, pharmacy notes, vitals, pertinent old records I have discussed plan of care as described above with RN and patient/family.  CBC: Recent Labs  Lab 10/12/22 0847 10/13/22 2314 10/14/22 0457 10/15/22 0256  WBC 17.1* 19.0* 18.4* 23.9*  NEUTROABS  --  12.8*  --   --   HGB 13.2 13.0 12.6 11.1*  HCT 42.0 40.6 40.3 34.5*  MCV 84.7 84.4 85.2 83.7  PLT 533* 349 330 321   Basic Metabolic Panel: Recent Labs  Lab 10/12/22 0847 10/13/22 2314 10/14/22 0320 10/15/22 0256  NA 133* 133* 132* 132*  K 3.6 3.4* 4.0 3.7  CL 109 100 103 107  CO2 17* 22 19* 18*  GLUCOSE 185* 214* 229* 226*  BUN 48* 34* 30* 46*  CREATININE 1.44* 1.29* 1.22* 1.32*  CALCIUM 7.4* 7.3* 6.8* 7.4*  MG  --   --  1.4* 2.2  PHOS  --   --  4.0 4.2    Studies: No results found.  Scheduled Meds:  Chlorhexidine Gluconate Cloth  6 each Topical Daily   feeding supplement  237 mL Oral TID BM   furosemide  20 mg Intravenous Daily   guaiFENesin  600 mg Oral BID   insulin aspart  0-20 Units Subcutaneous Q4H   methylPREDNISolone (SOLU-MEDROL) injection  40 mg Intravenous Daily   Continuous Infusions:  sodium chloride 10 mL/hr at 10/15/22 0700   ceFEPime (MAXIPIME) IV Stopped (10/15/22 0629)   heparin 1,150 Units/hr (10/15/22 0700)   norepinephrine (LEVOPHED) Adult infusion Stopped (10/14/22 0313)   PRN Meds: acetaminophen, docusate sodium, ibuprofen, ipratropium-albuterol, ondansetron (ZOFRAN) IV, polyethylene glycol  Time spent: 50 minutes  Author: Gillis Santa. MD [facility] 10/15/2022 10:39 AM  To reach On-call, see care teams to locate the attending and reach out to them via [email or web address]. If 7PM-7AM, please contact night-coverage If you still have difficulty reaching the attending provider, please page the [facility] (Director on Call) for [facility] on amion for assistance.

## 2022-10-15 NOTE — Progress Notes (Signed)
[facility]  ROUNDING NOTE   Subjective:   Catherine [name]  is a 22 y.o. 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 Thedore Mins.  Patient was last seen in office on 09/20/2022.  Patient seen sitting up in bed States she feels better today Remains on 2L Levo held Remains on heparin drip   Objective:  Vital signs in last 24 hours:  Temp:  [97.3 F (36.3 C)-97.9 F (36.6 C)] 97.7 F (36.5 C) (04/14 0400) Pulse Rate:  [74-100] 74 (04/14 0730) Resp:  [0-34] 25 (04/14 0730) BP: (78-104)/(56-80) 91/74 (04/14 0730) SpO2:  [90 %-100 %] 99 % (04/14 0730) Weight:  [62.9 kg] 62.9 kg (04/14 0409)  Weight change: 8.4 kg Filed Weights   10/13/22 2257 10/14/22 0425 10/15/22 0409  Weight: 54.5 kg 60.2 kg 62.9 kg    Intake/Output: I/O last 3 completed shifts: In: 3085.5 [P.O.:1560; I.V.:376; IV Piggyback:1149.5] Out: 550 [Urine:550]   Intake/Output this shift:  No intake/output data recorded.  Physical Exam: General: NAD, resting comfortably  Head: Normocephalic, atraumatic. Moist oral mucosal membranes  Eyes: Anicteric  Lungs:  Diminished in bases, normal effort, NCO2  Heart: Regular rate and rhythm  Abdomen:  Soft, nontender  Extremities:  trace peripheral edema.  Neurologic: Alert and oriented, moving all four extremities  Skin: No lesions  Access: None    Basic Metabolic Panel: Recent Labs  Lab 10/12/22 0847 10/13/22 2314 10/14/22 0320 10/15/22 0256  NA 133* 133* 132* 132*  K 3.6 3.4* 4.0 3.7  CL 109 100 103 107  CO2 17* 22 19* 18*  GLUCOSE 185* 214* 229* 226*  BUN 48* 34* 30*  46*  CREATININE 1.44* 1.29* 1.22* 1.32*  CALCIUM 7.4* 7.3* 6.8* 7.4*  MG  --   --  1.4* 2.2  PHOS  --   --  4.0 4.2     Liver Function Tests: Recent Labs  Lab 10/12/22 0847 10/13/22 2314  AST 18 18  ALT 11 12  ALKPHOS 113 115  BILITOT 0.4 0.6  PROT 4.2* 4.7*  ALBUMIN <1.5* <1.5*    Recent Labs  Lab 10/13/22 2314  LIPASE 27    No results for input(s): "AMMONIA" in the last 168 hours.  CBC: Recent Labs  Lab 10/12/22 0847 10/13/22 2314 10/14/22 0457 10/15/22 0256  WBC 17.1* 19.0* 18.4* 23.9*  NEUTROABS  --  12.8*  --   --   HGB 13.2 13.0 12.6 11.1*  HCT 42.0 40.6 40.3 34.5*  MCV 84.7 84.4 85.2 83.7  PLT 533* 349 330 321     Cardiac Enzymes: No results for input(s): "CKTOTAL", "CKMB", "CKMBINDEX", "TROPONINI" in the last 168 hours.  BNP: Invalid input(s): "POCBNP"  CBG: Recent Labs  Lab 10/14/22 1658 10/14/22 1929 10/14/22 2320 10/15/22 0336 10/15/22 0809  GLUCAP 230* 248* 278* 209* 109*     Microbiology: Results for orders placed or performed during the hospital encounter of 10/13/22  Urine Culture     Status: Abnormal   Collection Time: 10/13/22 11:49 PM   Specimen: Urine, Clean Catch  Result Value Ref Range Status   Specimen Description   Final    URINE, CLEAN CATCH Performed at [facility], [address], [place], [place] [address]    Special Requests   Final    NONE Performed at [facility], [address] [place], [place] [address]    Culture (A)  Final    <10,000 COLONIES/mL INSIGNIFICANT GROWTH Performed at [facility], [address], [place], [place] [address]    Report Status 10/15/2022 FINAL  Final  Resp panel by RT-PCR (RSV, Flu A&B, Covid) Anterior Nasal Swab     Status: None   Collection Time: 10/13/22 11:51 PM   Specimen: Anterior Nasal Swab  Result Value Ref Range Status   SARS Coronavirus 2 by RT PCR NEGATIVE NEGATIVE Final    Comment: (NOTE) SARS-CoV-2 target nucleic acids are NOT  DETECTED.  The SARS-CoV-2 RNA is generally detectable in upper respiratory specimens during the acute phase of infection. The lowest concentration of SARS-CoV-2 viral copies this assay can detect is 138 copies/mL. A negative result does not preclude SARS-Cov-2 infection and should not be used as the sole basis for treatment or other patient management decisions. A negative result may occur with  improper specimen collection/handling, submission of specimen other than nasopharyngeal swab, presence of viral mutation(s) within the areas targeted by this assay, and inadequate number of viral copies(<138 copies/mL). A negative result must be combined with clinical observations, patient history, and epidemiological information. The expected result is Negative.  Fact Sheet for Patients:  BloggerCourse.com  Fact Sheet for Healthcare Providers:  SeriousBroker.it  This test is no t yet approved or cleared by the [place] FDA and  has been authorized for detection and/or diagnosis of SARS-CoV-2 by FDA under an Emergency Use Authorization (EUA). This EUA will remain  in effect (meaning this test can be used) for the duration of the COVID-19 declaration under Section 564(b)(1) of the Act, 21 U.S.C.section 360bbb-3(b)(1), unless the authorization is terminated  or revoked sooner.       Influenza A by PCR NEGATIVE NEGATIVE Final   Influenza B by PCR NEGATIVE NEGATIVE Final    Comment: (NOTE) The Xpert Xpress SARS-CoV-2/FLU/RSV plus assay is intended as an aid in the diagnosis of influenza from Nasopharyngeal swab specimens and should not be used as a sole basis for treatment. Nasal washings and aspirates are unacceptable for Xpert Xpress SARS-CoV-2/FLU/RSV testing.  Fact Sheet for Patients: BloggerCourse.com  Fact Sheet for Healthcare Providers: SeriousBroker.it  This test is not yet  approved or cleared by the [place] FDA and has been authorized for detection and/or diagnosis of SARS-CoV-2 by FDA under an Emergency Use Authorization (EUA). This EUA will remain in effect (meaning this test can be used) for the duration of the COVID-19 declaration under Section 564(b)(1) of the Act, 21 U.S.C. section 360bbb-3(b)(1), unless the authorization is terminated or revoked.     Resp Syncytial Virus by PCR NEGATIVE NEGATIVE Final    Comment: (NOTE) Fact Sheet for Patients: BloggerCourse.com  Fact Sheet for Healthcare Providers: SeriousBroker.it  This test is not yet approved or cleared by the [place] FDA and has been authorized for detection and/or diagnosis of SARS-CoV-2 by FDA under an Emergency Use Authorization (EUA). This EUA will remain in effect (meaning this test can be used) for the duration of the COVID-19 declaration under Section 564(b)(1) of the Act, 21 U.S.C. section 360bbb-3(b)(1), unless the authorization is terminated or revoked.  Performed at [facility], [address] [place], [place] [address]   Blood culture (routine x 2)     Status: None (Preliminary result)  Collection Time: 10/14/22  2:47 AM   Specimen: BLOOD  Result Value Ref Range Status   Specimen Description BLOOD BLOOD RIGHT ARM  Final   Special Requests   Final    BOTTLES DRAWN AEROBIC AND ANAEROBIC Blood Culture adequate volume   Culture   Final    NO GROWTH 1 DAY Performed at [facility], [address], [place], [place] [address]    Report Status PENDING  Incomplete  Blood culture (routine x 2)     Status: None (Preliminary result)   Collection Time: 10/14/22  2:47 AM   Specimen: BLOOD  Result Value Ref Range Status   Specimen Description BLOOD BLOOD RIGHT ARM  Final   Special Requests   Final    BOTTLES DRAWN AEROBIC AND ANAEROBIC Blood Culture adequate volume   Culture   Final    NO GROWTH  1 DAY Performed at [facility], [address] [place], [place] [address]    Report Status PENDING  Incomplete  MRSA Next Gen by PCR, Nasal     Status: None   Collection Time: 10/14/22  4:34 AM   Specimen: Nasal Mucosa; Nasal Swab  Result Value Ref Range Status   MRSA by PCR Next Gen NOT DETECTED NOT DETECTED Final    Comment: (NOTE) The GeneXpert MRSA Assay (FDA approved for NASAL specimens only), is one component of a comprehensive MRSA colonization surveillance program. It is not intended to diagnose MRSA infection nor to guide or monitor treatment for MRSA infections. Test performance is not FDA approved in patients less than 79 years old. Performed at [facility], [address] [place], [place] [address]     Coagulation Studies: Recent Labs    10/13/22 2314 10/14/22 0457  LABPROT 17.9* 18.1*  INR 1.5* 1.5*     Urinalysis: Recent Labs    10/13/22 2314  COLORURINE YELLOW*  LABSPEC 1.037*  PHURINE 6.0  GLUCOSEU 150*  HGBUR SMALL*  BILIRUBINUR NEGATIVE  KETONESUR NEGATIVE  PROTEINUR >=300*  NITRITE NEGATIVE  LEUKOCYTESUR NEGATIVE       Imaging: CT Chest High Resolution  Result Date: 10/14/2022 CLINICAL DATA:  Hemoptysis, dizziness and nephrotic syndrome. Recent sub massive pulmonary embolus with right heart dysfunction. Nephrotic syndrome. EXAM: CT CHEST WITHOUT CONTRAST TECHNIQUE: Multidetector CT imaging of the chest was performed following the standard protocol without intravenous contrast. High resolution imaging of the lungs, as well as inspiratory and expiratory imaging, was performed. RADIATION DOSE REDUCTION: This exam was performed according to the departmental dose-optimization program which includes automated exposure control, adjustment of the mA and/or kV according to patient size and/or use of iterative reconstruction technique. COMPARISON:  10/12/2022 and 09/25/2022 and 08/22/2022. FINDINGS: Cardiovascular: Mild cardiac  enlargement. Small pericardial effusion appears similar to previous exam. Mediastinum/Nodes: Thyroid gland, trachea, and esophagus are unremarkable. No enlarged mediastinal or axillary lymph nodes. Hilar structures are suboptimally evaluated due to lack of IV contrast. Lungs/Pleura: Small right pleural effusion and trace left pleural effusion are again noted. There is a large geographic area of progressive ground-glass attenuation involving the right upper lobe, image 35/2. A patchy area of ground-glass attenuation is noted within the medial left upper lobe which is also increased from the previous exam more generalized diffuse ground-glass attenuation is noted within the right middle lobe and both lower lobes. -Peripheral, nodular consolidation with central cavitation within the right upper lobe measures 2.1 x 1.1 cm, image 38/2. Formally 2.1 by 0.7 cm. -Within the posterolateral right lung base there  is a subpleural cavitary process measuring 3.0 x 1.8 cm, image 43/2. Previously 2.8 x 1.7 cm. -Consolidation with central cavitation involving the right middle lobe is again identified. This measures 5.5 x 4.3 cm, image 64/2. On the previous exam this area measured 6.1 x 3.7 cm. -New rounded area of masslike consolidation within the central right middle lobe extending across the posterior major fissure into the superior segment of right lower lobe measures 2.3 x 2.8 cm, image 61/2. -Similar appearance of subpleural consolidation within the posteromedial right base, image 85/2. Subpleural cystic changes are identified overlying bilateral posterior lung bases and to a lesser extent the subpleural left apex, image 17/2. Upper Abdomen: No acute abnormality. Musculoskeletal: No chest wall mass or suspicious bone lesions identified. IMPRESSION: 1. Large geographic area of progressive ground-glass attenuation is identified within the right upper lobe. Smaller area of progressive subpleural ground-glass attenuation is noted  within the left upper lobe. These areas may represent foci of alveolar hemorrhage, infection, or edema. 2. Multifocal areas of nodular and masslike architectural distortion with internal cavitation are again noted. The largest areas in the right middle lobe. As mention previously these may represent areas of pulmonary infarct with internal necrosis. Differential considerations include septic emboli, atypical infection or vasculitis. 3. New area of nodular masslike architectural distortion within the right middle lobe and superior segment of the right lower lobe (centered around the major fissure). This may also represent an area of evolving pulmonary infarct, embolic phenomenon, infection or vasculitis. 4. Unchanged small right pleural effusion and trace left pleural effusion. 5. Subpleural cystic changes are again seen within both lung bases. Etiology is indeterminate. Cannot exclude underlying fibrotic interstitial lung disease. Electronically Signed   By: Signa Kell M.D.   On: 10/14/2022 11:05   CT HEAD WO CONTRAST ( )  Result Date: 10/14/2022 CLINICAL DATA:  Syncope/presyncope. EXAM: CT HEAD WITHOUT CONTRAST TECHNIQUE: Contiguous axial images were obtained from the base of the skull through the vertex without intravenous contrast. RADIATION DOSE REDUCTION: This exam was performed according to the departmental dose-optimization program which includes automated exposure control, adjustment of the mA and/or kV according to patient size and/or use of iterative reconstruction technique. COMPARISON:  None Available. FINDINGS: Brain: No evidence of acute infarction, hemorrhage, hydrocephalus, extra-axial collection or mass lesion/mass effect. Vascular: No hyperdense vessel or unexpected calcification. Skull: Normal. Negative for fracture or focal lesion. Sinuses/Orbits: No acute finding. IMPRESSION: Negative head CT. Electronically Signed   By: Tiburcio Pea M.D.   On: 10/14/2022 04:38   DG Chest Portable  1 View  Result Date: 10/14/2022 CLINICAL DATA:  worsening SOB EXAM: PORTABLE CHEST 1 VIEW COMPARISON:  Chest x-ray 10/13/2022 FINDINGS: The heart and mediastinal contours are unchanged. Interval worsening of diffuse patchy airspace opacities within the right lung. No pulmonary edema. No pleural effusion. No pneumothorax. No acute osseous abnormality. IMPRESSION: Interval worsening of diffuse patchy airspace opacities within the right lung. Electronically Signed   By: Tish Frederickson M.D.   On: 10/14/2022 01:51   DG Chest Port 1 View  Result Date: 10/13/2022 CLINICAL DATA:  Pain. EXAM: PORTABLE CHEST 1 VIEW COMPARISON:  Radiograph and CT yesterday FINDINGS: Similar right pleural effusion, the left pleural effusion on CT is not well-defined by radiograph. Ill-defined airspace disease throughout the right lung, similar in radiographic appearance. The cavitary components on CT are not well-defined by radiograph. Stable heart size and mediastinal contours. No pneumothorax. Remote right clavicle fracture. IMPRESSION: Unchanged appearance of the chest with right pleural effusion  and ill-defined airspace disease throughout the right lung. Cavitary components on CT are not well-defined by radiograph. Electronically Signed   By: Narda Rutherford M.D.   On: 10/13/2022 23:17     Medications:    sodium chloride 10 mL/hr at 10/15/22 0700   ceFEPime (MAXIPIME) IV Stopped (10/15/22 8119)   heparin 1,150 Units/hr (10/15/22 0700)   norepinephrine (LEVOPHED) Adult infusion Stopped (10/14/22 0313)    Chlorhexidine Gluconate Cloth  6 each Topical Daily   feeding supplement  237 mL Oral TID BM   furosemide  20 mg Intravenous Daily   guaiFENesin  600 mg Oral BID   insulin aspart  0-20 Units Subcutaneous Q4H   methylPREDNISolone (SOLU-MEDROL) injection  40 mg Intravenous Daily   acetaminophen, docusate sodium, ibuprofen, ipratropium-albuterol, ondansetron (ZOFRAN) IV, polyethylene glycol  Assessment/ Plan:  Ms.  DEAVION STRIDER is a 22 y.o.  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 Most recent urine with 2.3 grams of proteinuria on 09/20/2022. With hypoalbuminemia.  Baseline creatinine 1.08  Renal function stable. Reports good UOP. Will continue to hold tacrolimus and prednisone for now.    Pulmonary embolism: with hypoalbuminemia and nephrotic syndrome.  Was discharged home subcu Lovenox and Coumadin Heparin drip managed by primary team    Hypotension, possibly secondary to infection.  Levophed held. Ordered Furosemide 20mg  IV daily.    Secondary Hyperparathyroidism: not currently on an activated vitamin D.  Calcium slowly corrected.   5.  Diabetes mellitus, type 1.  Not well-controlled.  With chronic kidney disease.  A1c 9.1 on 08/25/2022.  Glucose elevated, likely due to solumedrol. Primary team to manage sliding scale insulin.   LOS: 1   4/14/20249:48 AM

## 2022-10-15 NOTE — Progress Notes (Signed)
ANTICOAGULATION CONSULT NOTE   Pharmacy Consult for Heparin  Indication: pulmonary embolus  No Known Allergies  Patient Measurements: Height: 4\' 11"  (149.9 cm) Weight: 62.9 kg (138 lb 10.7 oz) IBW/kg (Calculated) : 43.2 Heparin Dosing Weight: 55.9 kg   Vital Signs: Temp: 97.7 F (36.5 C) (04/14 0400) Temp Source: Oral (04/14 0400) BP: 103/82 (04/14 1100) Pulse Rate: 91 (04/14 1100)  Labs: Recent Labs    10/13/22 2314 10/14/22 0056 10/14/22 0320 10/14/22 0457 10/14/22 1346 10/14/22 2058 10/15/22 0256 10/15/22 0948  HGB 13.0  --   --  12.6  --   --  11.1*  --   HCT 40.6  --   --  40.3  --   --  34.5*  --   PLT 349  --   --  330  --   --  321  --   APTT  --   --   --  30  --   --   --   --   LABPROT 17.9*  --   --  18.1*  --   --   --   --   INR 1.5*  --   --  1.5*  --   --   --   --   HEPARINUNFRC  --   --   --   --    < > 0.25* 0.28* 0.39  CREATININE 1.29*  --  1.22*  --   --   --  1.32*  --   TROPONINIHS 37* 36*  --   --   --   --   --   --    < > = values in this interval not displayed.     Estimated Creatinine Clearance: 53.9 mL/min (A) (by C-G formula based on SCr of 1.32 mg/dL (H)).   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 20 tablet 0 prn   benzonatate (TESSALON PERLES) 100 MG capsule Take 1 capsule (100 mg total) by mouth 3 (three) times daily as needed for cough. 30 capsule 1 prn   calcitRIOL (ROCALTROL) 0.25 MCG capsule Take 0.25 mcg by mouth as directed. Take 1 capsule (0.25 mcg) three times a week.   10/11/2022    chlorpheniramine-HYDROcodone (TUSSIONEX) 10-8 MG/5ML Take 5 mLs by mouth every 12 (twelve) hours as needed for cough. 115 mL 0 prm   glucagon 1 MG injection Inject 1 mg into the muscle as needed.   prn   insulin aspart (NOVOLOG) 100 UNIT/ML injection Inject 5-8 Units into the skin 3 (three) times daily before meals. 7.2 mL 2 10/13/2022   ketorolac (TORADOL) 10 MG tablet Take 1 tablet (10 mg total) by mouth every 6 (six) hours as needed. 20 tablet 0 prn   LANTUS 100 UNIT/ML injection Inject 0.15 mLs (15 Units total) into the skin at bedtime. 4.5 mL 2 10/13/2022   Multiple Vitamin (MULTIVITAMIN WITH MINERALS) TABS tablet Take 1 tablet by mouth daily.   10/13/2022   [EXPIRED] oxyCODONE (ROXICODONE) 5 MG immediate release tablet Take 1 tablet (5  mg total) by mouth every 8 (eight) hours as needed for up to 2 days. 6 tablet 0 prn   tacrolimus (PROGRAF) 1 MG capsule Take 2 capsules (2 mg total) by mouth 2 (two) times daily. 120 capsule 2 10/13/2022   torsemide (DEMADEX) 20 MG tablet Take 1 tablet (20 mg total) by mouth daily. 30 tablet 0 10/13/2022   Vitamin D, Ergocalciferol, (DRISDOL) 1.25 MG (50000 UNIT) CAPS capsule Take 50,000 Units by mouth every 7 (seven) days.   Past Week   warfarin (COUMADIN) 5 MG tablet Take 1 tablet (5 mg total) by mouth daily. 30 tablet 2 10/13/2022 at 1900   blood glucose meter kit and supplies Dispense based on patient and insurance preference. Use up to four times daily as directed. (FOR ICD-10 E10.9, E11.9). 1 each 0    enoxaparin (LOVENOX) 60 MG/0.6ML injection Inject 0.575 mLs (57.5 mg total) into the skin every 12 (twelve) hours. (Patient not taking: Reported on 10/14/2022) 14 mL 0 Not Taking   feeding supplement, GLUCERNA SHAKE, (GLUCERNA SHAKE) LIQD Take 237 mLs by mouth 2 (two) times daily between meals.  0    sulfamethoxazole-trimethoprim (BACTRIM DS) 800-160 MG tablet Take 1 tablet by mouth every Monday, Wednesday, and Friday. (Patient not taking: Reported on 10/14/2022) 12  tablet 2 Completed Course   torsemide (DEMADEX) 20 MG tablet Take 1 tablet (20 mg total) by mouth daily. 30 tablet 0     Assessment: Pharmacy consulted to dose heparin for PE in this 22 year old female admitted with sepsis.  Per PTA med list, pt was on lovenox 57.5 mg SQ Q12H at some point but was no longer taking.  Pt was on Warfarin 5 mg PO daily,  last dose on 4/12 @ 1900.   4/13 1346 HL 0.14  4/13 2117 HL 0.25 4/14 0256 HL 0.28 4/14 0948 HL 0.39   Goal of Therapy:  Heparin level 0.3-0.7 units/ml Monitor platelets by anticoagulation protocol: Yes   Plan:  Heparin level is therapeutic. Will continue heparin infusion at 1150 units/hr. Recheck heparin level in 6 hours. CBC daily while on heparin.    Ronnald Ramp, PharmD, BCPS Clinical Pharmacist  10/15/2022 11:29 AM

## 2022-10-15 NOTE — Progress Notes (Signed)
PHARMACY CONSULT NOTE - FOLLOW UP  Pharmacy Consult for Electrolyte Monitoring and Replacement   Recent Labs: Potassium (mmol/L)  Date Value  10/15/2022 3.7   Magnesium (mg/dL)  Date Value  45/80/9983 2.2   Calcium (mg/dL)  Date Value  38/25/0539 7.4 (L)   Albumin (g/dL)  Date Value  76/73/4193 <1.5 (L)   Phosphorus (mg/dL)  Date Value  79/08/4095 4.2   Sodium (mmol/L)  Date Value  10/15/2022 132 (L)     Assessment: 22 y.o female with significant multiple PMH of uncontrolled TIDM, CKD stage IIIa with minimal-change disease and nephrotic syndrome, secondary hyperparathyroidism, hypoalbuminemia, HLD, HTN, polysubstance abuse (tobacco abuse, marijuana use), recent pulmonary embolism with right heart strain and multiple pulmonary infarcts, and cavitary lesions who presented to the ED  on 10/13/22 with chief complaints of hemoptysis and dizziness. Scr trending up but at baseline.   On lasix 20 mg IV daily. Albumin 12.5 g x 1 IV on 4/13.   Goal of Therapy:  Electrolytes WNL   Plan:  No replacement needed today.  F/u with AM labs.   Ronnald Ramp ,PharmD Clinical Pharmacist 10/15/2022 7:40 AM

## 2022-10-15 NOTE — Progress Notes (Signed)
ANTICOAGULATION CONSULT NOTE   Pharmacy Consult for Heparin  Indication: pulmonary embolus  No Known Allergies  Patient Measurements: Height:  (149.9 cm) Weight: 60.2 kg (132 lb 11.5 oz) IBW/kg (Calculated) : 43.2 Heparin Dosing Weight: 55.9 kg   Vital Signs: Temp: 97.5 F (36.4 C) (04/14 0100) Temp Source: Oral (04/14 0100) BP: 98/73 (04/14 0330) Pulse Rate: 85 (04/14 0330)  Labs: Recent Labs    10/12/22 0805 10/12/22 0847 10/12/22 0847 10/13/22 2314 10/14/22 0056 10/14/22 0320 10/14/22 0457 10/14/22 1346 10/14/22 2058 10/15/22 0256  HGB  --  13.2   < > 13.0  --   --  12.6  --   --  11.1*  HCT  --  42.0   < > 40.6  --   --  40.3  --   --  34.5*  PLT  --  533*   < > 349  --   --  330  --   --  321  APTT  --   --   --   --   --   --  30  --   --   --   LABPROT 20.6*  --   --  17.9*  --   --  18.1*  --   --   --   INR 1.8*  --   --  1.5*  --   --  1.5*  --   --   --   HEPARINUNFRC  --   --   --   --   --   --   --  0.14* 0.25* 0.28*  CREATININE  --  1.44*  --  1.29*  --  1.22*  --   --   --   --   TROPONINIHS  --   --   --  37* 36*  --   --   --   --   --    < > = values in this interval not displayed.     Estimated Creatinine Clearance: 57.1 mL/min (A) (by C-G formula based on SCr of 1.22 mg/dL (H)).   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 20 tablet 0 prn   benzonatate (TESSALON PERLES) 100 MG capsule Take 1 capsule (100 mg total) by mouth 3 (three) times daily as needed for cough. 30 capsule 1 prn   calcitRIOL (ROCALTROL)  0.25 MCG capsule Take 0.25 mcg by mouth as directed. Take 1 capsule (0.25 mcg) three times a week.   10/11/2022   chlorpheniramine-HYDROcodone (TUSSIONEX) 10-8 MG/5ML Take 5 mLs by mouth every 12 (twelve) hours as needed for cough. 115 mL 0 prm   glucagon 1 MG injection Inject 1 mg into the muscle as needed.   prn   insulin aspart (NOVOLOG) 100 UNIT/ML injection Inject 5-8 Units into the skin 3 (three) times daily before meals. 7.2 mL 2 10/13/2022   ketorolac (TORADOL) 10 MG tablet Take 1 tablet (10 mg total) by mouth every 6 (six) hours as needed. 20 tablet 0 prn   LANTUS  100 UNIT/ML injection Inject 0.15 mLs (15 Units total) into the skin at bedtime. 4.5 mL 2 10/13/2022   Multiple Vitamin (MULTIVITAMIN WITH MINERALS) TABS tablet Take 1 tablet by mouth daily.   10/13/2022   [EXPIRED] oxyCODONE (ROXICODONE) 5 MG immediate release tablet Take 1 tablet (5 mg total) by mouth every 8 (eight) hours as needed for up to 2 days. 6 tablet 0 prn   tacrolimus (PROGRAF) 1 MG capsule Take 2 capsules (2 mg total) by mouth 2 (two) times daily. 120 capsule 2 10/13/2022   torsemide (DEMADEX) 20 MG tablet Take 1 tablet (20 mg total) by mouth daily. 30 tablet 0 10/13/2022   Vitamin D, Ergocalciferol, (DRISDOL) 1.25 MG (50000 UNIT) CAPS capsule Take 50,000 Units by mouth every 7 (seven) days.   Past Week   warfarin (COUMADIN) 5 MG tablet Take 1 tablet (5 mg total) by mouth daily. 30 tablet 2 10/13/2022 at 1900   blood glucose meter kit and supplies Dispense based on patient and insurance preference. Use up to four times daily as directed. (FOR ICD-10 E10.9, E11.9). 1 each 0    enoxaparin (LOVENOX) 60 MG/0.6ML injection Inject 0.575 mLs (57.5 mg total) into the skin every 12 (twelve) hours. (Patient not taking: Reported on 10/14/2022) 14 mL 0 Not Taking   feeding supplement, GLUCERNA SHAKE, (GLUCERNA SHAKE) LIQD Take 237 mLs by mouth 2 (two) times daily between meals.  0    sulfamethoxazole-trimethoprim (BACTRIM DS) 800-160 MG  tablet Take 1 tablet by mouth every Monday, Wednesday, and Friday. (Patient not taking: Reported on 10/14/2022) 12 tablet 2 Completed Course   torsemide (DEMADEX) 20 MG tablet Take 1 tablet (20 mg total) by mouth daily. 30 tablet 0     Assessment: Pharmacy consulted to dose heparin for PE in this 22 year old female admitted with sepsis.  Per PTA med list, pt was on lovenox 57.5 mg SQ Q12H at some point but was no longer taking.  Pt was on Warfarin 5 mg PO daily,  last dose on 4/12 @ 1900.   4/13 1346 HL 0.14  4/13 2117 HL 0.25 4/14 0256 HL 0.28   Goal of Therapy:  Heparin level 0.3-0.7 units/ml Monitor platelets by anticoagulation protocol: Yes   Plan:  4/14: HL @ 0256 = 0.28, SUBtherapeutic - Will order heparin 850 units IV X 1 bolus and increase drip rate to 1150 units/hr.  Recheck heparin level in 6 hours.  CBC daily while on heparin.   Clinton Wahlberg D Clinical Pharmacist  10/15/2022 3:41 AM

## 2022-10-16 DIAGNOSIS — I2699 Other pulmonary embolism without acute cor pulmonale: Secondary | ICD-10-CM | POA: Diagnosis not present

## 2022-10-16 DIAGNOSIS — I959 Hypotension, unspecified: Secondary | ICD-10-CM | POA: Diagnosis not present

## 2022-10-16 DIAGNOSIS — I2724 Chronic thromboembolic pulmonary hypertension: Secondary | ICD-10-CM | POA: Diagnosis not present

## 2022-10-16 DIAGNOSIS — A419 Sepsis, unspecified organism: Secondary | ICD-10-CM | POA: Diagnosis not present

## 2022-10-16 LAB — BASIC METABOLIC PANEL
Anion gap: 7 (ref 5–15)
BUN: 48 mg/dL — ABNORMAL HIGH (ref 6–20)
CO2: 21 mmol/L — ABNORMAL LOW (ref 22–32)
Calcium: 7.8 mg/dL — ABNORMAL LOW (ref 8.9–10.3)
Chloride: 108 mmol/L (ref 98–111)
Creatinine, Ser: 1.25 mg/dL — ABNORMAL HIGH (ref 0.44–1.00)
GFR, Estimated: >60 mL/min (ref >60)
Glucose, Bld: 141 mg/dL — ABNORMAL HIGH (ref 70–99)
Potassium: 3.6 mmol/L (ref 3.5–5.1)
Sodium: 136 mmol/L (ref 135–145)

## 2022-10-16 LAB — CBC
HCT: 34.7 % — ABNORMAL LOW (ref 36.0–46.0)
Hemoglobin: 11 g/dL — ABNORMAL LOW (ref 12.0–15.0)
MCH: 26.8 pg (ref 26.0–34.0)
MCHC: 31.7 g/dL (ref 30.0–36.0)
MCV: 84.4 fL (ref 80.0–100.0)
Platelets: 335 10*3/uL (ref 150–400)
RBC: 4.11 MIL/uL (ref 3.87–5.11)
RDW: 15.6 % — ABNORMAL HIGH (ref 11.5–15.5)
WBC: 20.8 10*3/uL — ABNORMAL HIGH (ref 4.0–10.5)
nRBC: 0.3 % — ABNORMAL HIGH (ref 0.0–0.2)

## 2022-10-16 LAB — CULTURE, BLOOD (ROUTINE X 2): Special Requests: ADEQUATE

## 2022-10-16 LAB — GLUCOSE, CAPILLARY
Glucose-Capillary: 128 mg/dL — ABNORMAL HIGH (ref 70–99)
Glucose-Capillary: 154 mg/dL — ABNORMAL HIGH (ref 70–99)
Glucose-Capillary: 182 mg/dL — ABNORMAL HIGH (ref 70–99)
Glucose-Capillary: 309 mg/dL — ABNORMAL HIGH (ref 70–99)
Glucose-Capillary: 322 mg/dL — ABNORMAL HIGH (ref 70–99)
Glucose-Capillary: 329 mg/dL — ABNORMAL HIGH (ref 70–99)
Glucose-Capillary: 416 mg/dL — ABNORMAL HIGH (ref 70–99)
Glucose-Capillary: 451 mg/dL — ABNORMAL HIGH (ref 70–99)
Glucose-Capillary: 454 mg/dL — ABNORMAL HIGH (ref 70–99)
Glucose-Capillary: 491 mg/dL — ABNORMAL HIGH (ref 70–99)
Glucose-Capillary: 87 mg/dL (ref 70–99)

## 2022-10-16 LAB — ECHOCARDIOGRAM COMPLETE
AR max vel: 1.62 cm2
AV Peak grad: 5 mmHg
Ao pk vel: 1.12 m/s
Area-P 1/2: 4.39 cm2
Height: 59 in
S' Lateral: 1.7 cm
Single Plane A4C EF: 59.4 %
Weight: 2218.71 oz

## 2022-10-16 LAB — HEPARIN LEVEL (UNFRACTIONATED): Heparin Unfractionated: 0.33 IU/mL (ref 0.30–0.70)

## 2022-10-16 LAB — MAGNESIUM: Magnesium: 2 mg/dL (ref 1.7–2.4)

## 2022-10-16 LAB — TACROLIMUS LEVEL: Tacrolimus (FK506) - LabCorp: 0.8 ng/mL — ABNORMAL LOW (ref 2.0–20.0)

## 2022-10-16 LAB — VITAMIN D 25 HYDROXY (VIT D DEFICIENCY, FRACTURES): Vit D, 25-Hydroxy: 9.85 ng/mL — ABNORMAL LOW (ref 30–100)

## 2022-10-16 LAB — LEGIONELLA PNEUMOPHILA SEROGP 1 UR AG: L. pneumophila Serogp 1 Ur Ag: NEGATIVE

## 2022-10-16 LAB — PHOSPHORUS: Phosphorus: 2.9 mg/dL (ref 2.5–4.6)

## 2022-10-16 NOTE — TOC Initial Note (Signed)
Transition of Care [facility]) - Initial/Assessment Note    Patient Details  Name: Catherine [name] MRN: [id number] Date of Birth: [birth year]  [facility]) CM/SW Contact:    Margarito Liner, LCSW Phone Number: 10/16/2022, 12:41 PM  Clinical Narrative:   Readmission prevention screen complete. CSW met with patient. No supports at bedside. CSW introduced role and explained that discharge planning would be discussed. PCP is at [facility]. She does not see a specific provider there. Patient drives herself to appointments. Pharmacy is [facility] on [address]. No issues obtaining medications. Patient lives home with her grandfather. No home health or DME use prior to admission. She is currently on 2 L acute oxygen so will follow for this potential need. No further concerns. CSW encouraged patient to contact CSW as needed. CSW will continue to follow patient for support and facilitate return home once stable. Her mother will transport her home at discharge.               Expected Discharge Plan: Home/Self Care Barriers to Discharge: Continued Medical Work up   Patient Goals and CMS Choice            Expected Discharge Plan and Services     Post Acute Care Choice: NA Living arrangements for the past 2 months: Apartment                                      Prior Living Arrangements/Services Living arrangements for the past 2 months: Apartment Lives with:: Relatives Patient language and need for interpreter reviewed:: Yes Do you feel safe going back to the place where you live?: Yes      Need for Family Participation in Patient Care: Yes (Comment) Care giver support system in place?: Yes (comment)   Criminal Activity/Legal Involvement Pertinent to Current Situation/Hospitalization: No - Comment as needed  Activities of Daily Living Home Assistive Devices/Equipment: CBG Meter ADL Screening (condition at time of admission) Patient's  cognitive ability adequate to safely complete daily activities?: Yes Is the patient deaf or have difficulty hearing?: No Does the patient have difficulty seeing, even when wearing glasses/contacts?: No Does the patient have difficulty concentrating, remembering, or making decisions?: No Patient able to express need for assistance with ADLs?: Yes Does the patient have difficulty dressing or bathing?: No Independently performs ADLs?: Yes (appropriate for developmental age) Does the patient have difficulty walking or climbing stairs?: No Weakness of Legs: None Weakness of Arms/Hands: None  Permission Sought/Granted                  Emotional Assessment Appearance:: Appears stated age Attitude/Demeanor/Rapport: Engaged Affect (typically observed): Accepting, Appropriate, Calm Orientation: : Oriented to Self, Oriented to Place, Oriented to  Time, Oriented to Situation Alcohol / Substance Use: Not Applicable Psych Involvement: No (comment)  Admission diagnosis:  Acute pulmonary edema [J81.0] Sepsis associated hypotension [A41.9, I95.9] Acute respiratory failure with hypoxia [J96.01] Septic shock [A41.9, R65.21] Community acquired pneumonia, unspecified laterality [J18.9] Patient Active Problem List   Diagnosis Date Noted   Sepsis associated hypotension 10/14/2022   Pulmonary infarction 09/05/2022   History of nephrotic syndrome 09/02/2022   Hypoglycemia 09/02/2022   Acute hypoxic respiratory failure 09/01/2022   Pulmonary embolism 08/30/2022   Cavitary lesion of lung 08/30/2022   Leukocytosis 08/30/2022   Pulmonary embolism with acute cor pulmonale 08/22/2022   Hypoalbuminemia 08/22/2022   Marijuana  abuse 08/22/2022   Tobacco dependence 08/22/2022   Abdominal pain 09/26/2021   Hypokalemia 09/26/2021   Dyslipidemia 09/26/2021   HCAP (healthcare-associated pneumonia) 09/26/2021   Severe sepsis with septic shock (CODE) 09/26/2021   Hypomagnesemia 09/26/2021    Pseudohyponatremia 09/04/2021   HTN (hypertension) 02/10/2021   DKA (diabetic ketoacidosis) 04/18/2020   Type 1 diabetes mellitus with other specified complication 08/27/2019   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   Proteinuria 08/23/2019   Nephrotic syndrome 08/23/2019   DM (diabetes mellitus) type I uncontrolled with renal manifestation 08/23/2019   AKI (acute kidney injury) 08/21/2019   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    Hyponatremia    Acidosis    Type 1 diabetes mellitus with hyperglycemia 10/12/2005   PCP:  [facility]:   [id number] [place], [place] - [address] AT [facility] [address] [place] [address] Phone: [phone] Fax: [phone]     Social Determinants of Health (SDOH) Social History: SDOH Screenings   Food Insecurity: No Food Insecurity (10/14/2022)  Housing: Low Risk  (10/14/2022)  Transportation Needs: No Transportation Needs (10/14/2022)  Utilities: Not At Risk (10/14/2022)  Tobacco Use: Medium Risk (10/13/2022)   SDOH Interventions:     Readmission Risk Interventions    10/16/2022   12:40 PM 08/31/2022   11:37 AM 08/25/2022    9:46 AM  Readmission Risk Prevention Plan  Transportation Screening Complete Complete Complete  Medication Review Oceanographer) Complete Complete Complete  PCP or Specialist appointment within 3-5 days of discharge Complete Complete Complete  HRI or Home Care Consult   Complete  SW Recovery Care/Counseling Consult Complete Complete Complete  Palliative Care Screening Not Applicable Not Applicable Not Applicable  Skilled Nursing Facility Not Applicable Not Applicable Not Applicable

## 2022-10-16 NOTE — Progress Notes (Signed)
PHARMACY CONSULT NOTE  Pharmacy Consult for Electrolyte Monitoring and Replacement   Recent Labs: Potassium (mmol/L)  Date Value  10/16/2022 3.6   Magnesium (mg/dL)  Date Value  64/33/2951 2.0   Calcium (mg/dL)  Date Value  88/41/6606 7.8 (L)   Albumin (g/dL)  Date Value  30/16/0109 <1.5 (L)   Phosphorus (mg/dL)  Date Value  32/35/5732 2.9   Sodium (mmol/L)  Date Value  10/16/2022 136   Assessment: 22 y.o female with significant multiple PMH of uncontrolled TIDM, CKD stage IIIa with minimal-change disease and nephrotic syndrome, secondary hyperparathyroidism, hypoalbuminemia, HLD, HTN, polysubstance abuse (tobacco abuse, marijuana use), recent pulmonary embolism with right heart strain and multiple pulmonary infarcts, and cavitary lesions who presented to the ED  on 10/13/22 with chief complaints of hemoptysis and dizziness. Scr trending up but at baseline.   Diuresis: IV Lasix 20 mg daily  Goal of Therapy:  Electrolytes within normal limits   Plan:  --No electrolyte replacement indicated at this time --Patient care transferred from PCCM to [facility]. Will discontinue electrolyte consult at this time. Defer further ordering of labs and electrolyte replacement to primary team --Pharmacy will continue to follow along peripherally  Tressie Ellis 10/16/2022 7:59 AM

## 2022-10-16 NOTE — Consult Note (Signed)
NAME: Catherine [name]  DOB: [birth year]  MRN: [id number]  Date/Time: 10/16/2022 10:16 AM  REQUESTING PROVIDER: Dr.Kumar Subjective:  REASON FOR CONSULT: pneumonia ? Catherine [name] is a 22 y.o. with a history of IDDM diagnosed when 22 yrs old. 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, nodular cavitary lesions since feb 2024, Increased PAP,readmitted between 2/28-3/4 Presents to ED on 4/11 with dizziness, weakness, hemoptysis Vitals in the ED  10/12/22 08:38  BP 109/75  Temp 98.2 F (36.8 C)  Pulse Rate 89  Resp 18  SpO2 97 %    Latest Reference Range & Units 10/12/22 08:47  WBC 4.0 - 10.5 K/uL 17.1 (H)  Hemoglobin 12.0 - 15.0 g/dL 40.9  HCT 81.1 - 91.4 % 42.0  Platelets 150 - 400 K/uL 533 (H)  Creatinine 0.44 - 1.00 mg/dL 7.82 (H)    Pt has had recurrent hospitalization/visit to ED She has been treated with multiple courses of antibiotics with minimal  relief She has been sob She takes tacrolimus and steroids- compliance unclear  Past Medical History:  Diagnosis Date   Diabetes mellitus without complication    Dyslipidemia    Hypoalbuminemia    Marijuana abuse    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   Nephrotic syndrome 08/23/2019   Tobacco dependence     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  Tobacco Use    Smoking status: Former    Types: E-cigarettes    Quit date: 07/2022    Years since quitting: 0.2   Smokeless tobacco: Never   Tobacco comments:    Quit substance use since the beginning of the year   Vaping Use   Vaping Use: Former  Substance and Sexual Activity   Alcohol use: Not Currently   Drug use: Yes    Types: Marijuana    Comment: Quit using marijuana approx earlier this year   Sexual activity: Not Currently  Other Topics Concern   Not on file  Social History Narrative   Not on file   Social Determinants of Health   Financial Resource Strain: Not on file  Food Insecurity: No Food Insecurity (10/14/2022)   Hunger Vital Sign    Worried About Running Out of Food in the Last Year: Never true    Ran Out of Food in the Last Year: Never true  Transportation Needs: No Transportation Needs (10/14/2022)   PRAPARE - Administrator, Civil Service (Medical): No    Lack of Transportation (Non-Medical): No  Physical Activity: Not on file  Stress: Not on file  Social Connections: Not on file  Intimate Partner Violence: Not At Risk (10/14/2022)   Humiliation, Afraid, Rape, and Kick questionnaire    Fear of Current or Ex-Partner: No    Emotionally Abused: No    Physically Abused: No    Sexually Abused:  No    History reviewed. No pertinent family history. No Known Allergies I? Current Facility-Administered Medications  Medication Dose Route Frequency Provider Last Rate Last Admin   0.9 %  sodium chloride infusion  250 mL Intravenous Continuous Jimmye Norman, NP   Stopped at 10/16/22 [phone]   acetaminophen (TYLENOL) tablet 650 mg  650 mg Oral Q4H PRN Erin Fulling, MD   650 mg at 10/16/22 0310   benzonatate (TESSALON) capsule 100 mg  100 mg Oral TID PRN Gillis Santa, MD   100 mg at 10/15/22 1228   ceFEPIme (MAXIPIME) 2 g in sodium chloride 0.9 % 100 mL IVPB  2 g Intravenous Q12H Orson Aloe, RPH 200 mL/hr at 10/16/22 0700 Infusion Verify at 10/16/22 0700    Chlorhexidine Gluconate Cloth 2 % PADS 6 each  6 each Topical Daily Erin Fulling, MD   6 each at 10/15/22 1431   chlorpheniramine-HYDROcodone (TUSSIONEX) 10-8 MG/5ML suspension 5 mL  5 mL Oral Q12H PRN Gillis Santa, MD       docusate sodium (COLACE) capsule 100 mg  100 mg Oral BID PRN Jimmye Norman, NP       feeding supplement (ENSURE ENLIVE / ENSURE PLUS) liquid 237 mL  237 mL Oral TID BM Gillis Santa, MD   237 mL at 10/15/22 1506   furosemide (LASIX) injection 20 mg  20 mg Intravenous Daily Jimmye Norman, NP   20 mg at 10/15/22 1035   guaiFENesin (MUCINEX) 12 hr tablet 600 mg  600 mg Oral BID Gillis Santa, MD   600 mg at 10/15/22 2201   heparin ADULT infusion 100 units/mL (25000 units/25mL)  1,150 Units/hr Intravenous Continuous Erin Fulling, MD 11.5 mL/hr at 10/16/22 0700 1,150 Units/hr at 10/16/22 0700   ibuprofen (ADVIL) tablet 800 mg  800 mg Oral Q6H PRN Erin Fulling, MD       insulin aspart (novoLOG) injection 0-20 Units  0-20 Units Subcutaneous Q4H Jimmye Norman, NP   4 Units at 10/16/22 0500   insulin glargine-yfgn (SEMGLEE) injection 15 Units  15 Units Subcutaneous QHS Gillis Santa, MD   15 Units at 10/15/22 2201   ipratropium-albuterol (DUONEB) 0.5-2.5 (3) MG/3ML nebulizer solution 3 mL  3 mL Nebulization Q6H PRN Jimmye Norman, NP       menthol-cetylpyridinium (CEPACOL) lozenge 3 mg  1 lozenge Oral PRN Gillis Santa, MD       methylPREDNISolone sodium succinate (SOLU-MEDROL) 40 mg/mL injection 40 mg  40 mg Intravenous Daily Jimmye Norman, NP   40 mg at 10/15/22 1037   ondansetron (ZOFRAN) injection 4 mg  4 mg Intravenous Q6H PRN Jimmye Norman, NP   4 mg at 10/16/22 0310   polyethylene glycol (MIRALAX / GLYCOLAX) packet 17 g  17 g Oral Daily PRN Jimmye Norman, NP         Abtx:  Anti-infectives (From admission, onward)    Start     Dose/Rate Route Frequency Ordered Stop   10/15/22 0800  vancomycin (VANCOCIN)  IVPB 1000 mg/200 mL premix  Status:  Discontinued        1,000 mg 200 mL/hr over 60 Minutes Intravenous Every 24 hours 10/14/22 0645 10/14/22 1511   10/14/22 1848  ceFEPIme (MAXIPIME) 2 g in sodium chloride 0.9 % 100 mL IVPB        2 g 200 mL/hr over 30 Minutes Intravenous Every 12 hours 10/14/22 0954     10/14/22 0730  vancomycin (VANCOREADY) IVPB 1500 mg/300 mL  1,500 mg 150 mL/hr over 120 Minutes Intravenous  Once 10/14/22 0636 10/14/22 1229   10/14/22 0730  ceFEPIme (MAXIPIME) 2 g in sodium chloride 0.9 % 100 mL IVPB  Status:  Discontinued        2 g 200 mL/hr over 30 Minutes Intravenous Every 8 hours 10/14/22 0638 10/14/22 0954   10/14/22 0230  cefTRIAXone (ROCEPHIN) 2 g in sodium chloride 0.9 % 100 mL IVPB        2 g 200 mL/hr over 30 Minutes Intravenous  Once 10/14/22 0227 10/14/22 0357   10/14/22 0230  azithromycin (ZITHROMAX) 500 mg in sodium chloride 0.9 % 250 mL IVPB        500 mg 250 mL/hr over 60 Minutes Intravenous  Once 10/14/22 0227 10/14/22 0557       REVIEW OF SYSTEMS:  Const: negative fever, negative chills, negative weight loss Eyes: negative diplopia or visual changes, negative eye pain ENT: negative coryza, negative sore throat Resp: negative cough, hemoptysis, dyspnea Cards: negative for chest pain, palpitations, lower extremity edema GU: negative for frequency, dysuria and hematuria GI: Negative for abdominal pain, diarrhea, bleeding, constipation Skin: negative for rash and pruritus Heme: negative for easy bruising and gum/nose bleeding MS: negative for myalgias, arthralgias, back pain and muscle weakness Neurolo:negative for headaches, dizziness, vertigo, memory problems  Psych: negative for feelings of anxiety, depression  Endocrine: negative for thyroid, diabetes Allergy/Immunology- negative for any medication or food allergies ? Pertinent Positives include : Objective:  VITALS:  BP 98/76   Pulse 83   Temp 97.8 F (36.6 C) (Oral)   Resp  (!) 26   Ht 4\' 11"  (1.499 m)   Wt 64.9 kg   LMP  (LMP Unknown)   SpO2 100%   BMI 28.90 kg/m  LDA Foley Central line Other drainage tubes PHYSICAL EXAM:  General: Alert, cooperative, no distress, appears stated age.  Head: Normocephalic, without obvious abnormality, atraumatic. Eyes: Conjunctivae clear, anicteric sclerae. Pupils are equal ENT Nares normal. No drainage or sinus tenderness. Lips, mucosa, and tongue normal. No Thrush Neck: Supple, symmetrical, no adenopathy, thyroid: non tender no carotid bruit and no JVD. Back: No CVA tenderness. Lungs: Clear to auscultation bilaterally. No Wheezing or Rhonchi. No rales. Heart: Regular rate and rhythm, no murmur, rub or gallop. Abdomen: Soft, non-tender,not distended. Bowel sounds normal. No masses Extremities: atraumatic, no cyanosis. No edema. No clubbing Skin: No rashes or lesions. Or bruising Lymph: Cervical, supraclavicular normal. Neurologic: Grossly non-focal Pertinent Labs Lab Results CBC    Component Value Date/Time   WBC 20.8 (H) 10/16/2022 0604   RBC 4.11 10/16/2022 0604   HGB 11.0 (L) 10/16/2022 0604   HGB 12.6 09/08/2022 1047   HCT 34.7 (L) 10/16/2022 0604   PLT 335 10/16/2022 0604   PLT 221 09/08/2022 1047   MCV 84.4 10/16/2022 0604   MCH 26.8 10/16/2022 0604   MCHC 31.7 10/16/2022 0604   RDW 15.6 (H) 10/16/2022 0604   LYMPHSABS 4.2 (H) 10/13/2022 2314   MONOABS 1.6 (H) 10/13/2022 2314   EOSABS 0.1 10/13/2022 2314   BASOSABS 0.1 10/13/2022 2314       Latest Ref Rng & Units 10/16/2022    6:04 AM 10/15/2022    2:56 AM 10/14/2022    3:20 AM  CMP  Glucose 70 - 99 mg/dL 219  758  832   BUN 6 - 20 mg/dL 48  46  30   Creatinine 0.44 - 1.00 mg/dL 5.49  8.26  4.15   Sodium 135 -  145 mmol/L 136  132  132   Potassium 3.5 - 5.1 mmol/L 3.6  3.7  4.0   Chloride 98 - 111 mmol/L 108  107  103   CO2 22 - 32 mmol/L 21  18  19    Calcium 8.9 - 10.3 mg/dL 7.8  7.4  6.8       Microbiology: Recent Results (from  the past 240 hour(s))  SARS Coronavirus 2 by RT PCR (hospital order, performed in [facility] hospital lab) *cepheid single result test* Anterior Nasal Swab     Status: None   Collection Time: 10/12/22 11:44 AM   Specimen: Anterior Nasal Swab  Result Value Ref Range Status   SARS Coronavirus 2 by RT PCR NEGATIVE NEGATIVE Final    Comment: (NOTE) SARS-CoV-2 target nucleic acids are NOT DETECTED.  The SARS-CoV-2 RNA is generally detectable in upper and lower respiratory specimens during the acute phase of infection. The lowest concentration of SARS-CoV-2 viral copies this assay can detect is 250 copies / mL. A negative result does not preclude SARS-CoV-2 infection and should not be used as the sole basis for treatment or other patient management decisions.  A negative result may occur with improper specimen collection / handling, submission of specimen other than nasopharyngeal swab, presence of viral mutation(s) within the areas targeted by this assay, and inadequate number of viral copies (<250 copies / mL). A negative result must be combined with clinical observations, patient history, and epidemiological information.  Fact Sheet for Patients:   RoadLapTop.co.za  Fact Sheet for Healthcare Providers: http://kim-miller.com/  This test is not yet approved or  cleared by the [place] FDA and has been authorized for detection and/or diagnosis of SARS-CoV-2 by FDA under an Emergency Use Authorization (EUA).  This EUA will remain in effect (meaning this test can be used) for the duration of the COVID-19 declaration under Section 564(b)(1) of the Act, 21 U.S.C. section 360bbb-3(b)(1), unless the authorization is terminated or revoked sooner.  Performed at [facility], [address] [place], [place] [address]   Urine Culture     Status: Abnormal   Collection Time: 10/13/22 11:49 PM   Specimen: Urine, Clean Catch  Result  Value Ref Range Status   Specimen Description   Final    URINE, CLEAN CATCH Performed at [facility], [address], [place], [place] [address]    Special Requests   Final    NONE Performed at [facility], [address], [place], [place] [address]    Culture (A)  Final    <10,000 COLONIES/mL INSIGNIFICANT GROWTH Performed at [facility], [address] [place], [place] [address]    Report Status 10/15/2022 FINAL  Final  Resp panel by RT-PCR (RSV, Flu A&B, Covid) Anterior Nasal Swab     Status: None   Collection Time: 10/13/22 11:51 PM   Specimen: Anterior Nasal Swab  Result Value Ref Range Status   SARS Coronavirus 2 by RT PCR NEGATIVE NEGATIVE Final    Comment: (NOTE) SARS-CoV-2 target nucleic acids are NOT DETECTED.  The SARS-CoV-2 RNA is generally detectable in upper respiratory specimens during the acute phase of infection. The lowest concentration of SARS-CoV-2 viral copies this assay can detect is 138 copies/mL. A negative result does not preclude SARS-Cov-2 infection and should not be used as the sole basis for treatment or other patient management decisions. A negative result may occur with  improper specimen collection/handling, submission of specimen other than nasopharyngeal swab, presence  of viral mutation(s) within the areas targeted by this assay, and inadequate number of viral copies(<138 copies/mL). A negative result must be combined with clinical observations, patient history, and epidemiological information. The expected result is Negative.  Fact Sheet for Patients:  BloggerCourse.com  Fact Sheet for Healthcare Providers:  SeriousBroker.it  This test is no t yet approved or cleared by the [place] FDA and  has been authorized for detection and/or diagnosis of SARS-CoV-2 by FDA under an Emergency Use Authorization (EUA). This EUA will remain  in effect (meaning this test  can be used) for the duration of the COVID-19 declaration under Section 564(b)(1) of the Act, 21 U.S.C.section 360bbb-3(b)(1), unless the authorization is terminated  or revoked sooner.       Influenza A by PCR NEGATIVE NEGATIVE Final   Influenza B by PCR NEGATIVE NEGATIVE Final    Comment: (NOTE) The Xpert Xpress SARS-CoV-2/FLU/RSV plus assay is intended as an aid in the diagnosis of influenza from Nasopharyngeal swab specimens and should not be used as a sole basis for treatment. Nasal washings and aspirates are unacceptable for Xpert Xpress SARS-CoV-2/FLU/RSV testing.  Fact Sheet for Patients: BloggerCourse.com  Fact Sheet for Healthcare Providers: SeriousBroker.it  This test is not yet approved or cleared by the [place] FDA and has been authorized for detection and/or diagnosis of SARS-CoV-2 by FDA under an Emergency Use Authorization (EUA). This EUA will remain in effect (meaning this test can be used) for the duration of the COVID-19 declaration under Section 564(b)(1) of the Act, 21 U.S.C. section 360bbb-3(b)(1), unless the authorization is terminated or revoked.     Resp Syncytial Virus by PCR NEGATIVE NEGATIVE Final    Comment: (NOTE) Fact Sheet for Patients: BloggerCourse.com  Fact Sheet for Healthcare Providers: SeriousBroker.it  This test is not yet approved or cleared by the [place] FDA and has been authorized for detection and/or diagnosis of SARS-CoV-2 by FDA under an Emergency Use Authorization (EUA). This EUA will remain in effect (meaning this test can be used) for the duration of the COVID-19 declaration under Section 564(b)(1) of the Act, 21 U.S.C. section 360bbb-3(b)(1), unless the authorization is terminated or revoked.  Performed at [facility], [address] [place], [place] [address]   Blood culture (routine x 2)      Status: None (Preliminary result)   Collection Time: 10/14/22  2:47 AM   Specimen: BLOOD  Result Value Ref Range Status   Specimen Description BLOOD BLOOD RIGHT ARM  Final   Special Requests   Final    BOTTLES DRAWN AEROBIC AND ANAEROBIC Blood Culture adequate volume   Culture   Final    NO GROWTH 2 DAYS Performed at [facility], [address] [place], [place] [address]    Report Status PENDING  Incomplete  Blood culture (routine x 2)     Status: None (Preliminary result)   Collection Time: 10/14/22  2:47 AM   Specimen: BLOOD  Result Value Ref Range Status   Specimen Description BLOOD BLOOD RIGHT ARM  Final   Special Requests   Final    BOTTLES DRAWN AEROBIC AND ANAEROBIC Blood Culture adequate volume   Culture   Final    NO GROWTH 2 DAYS Performed at [facility], [address], [place], [place] [address]    Report Status PENDING  Incomplete  MRSA Next Gen by PCR, Nasal     Status: None   Collection Time: 10/14/22  4:34 AM   Specimen: Nasal Mucosa;  Nasal Swab  Result Value Ref Range Status   MRSA by PCR Next Gen NOT DETECTED NOT DETECTED Final    Comment: (NOTE) The GeneXpert MRSA Assay (FDA approved for NASAL specimens only), is one component of a comprehensive MRSA colonization surveillance program. It is not intended to diagnose MRSA infection nor to guide or monitor treatment for MRSA infections. Test performance is not FDA approved in patients less than 84 years old. Performed at [facility], [address] [place], [place] [address]   Respiratory (~20 pathogens) panel by PCR     Status: None   Collection Time: 10/15/22 11:53 AM   Specimen: Nasopharyngeal Swab; Respiratory  Result Value Ref Range Status   Adenovirus NOT DETECTED NOT DETECTED Final   Coronavirus 229E NOT DETECTED NOT DETECTED Final    Comment: (NOTE) The Coronavirus on the Respiratory Panel, DOES NOT test for the novel  Coronavirus (2019 nCoV)    Coronavirus  HKU1 NOT DETECTED NOT DETECTED Final   Coronavirus NL63 NOT DETECTED NOT DETECTED Final   Coronavirus OC43 NOT DETECTED NOT DETECTED Final   Metapneumovirus NOT DETECTED NOT DETECTED Final   Rhinovirus / Enterovirus NOT DETECTED NOT DETECTED Final   Influenza A NOT DETECTED NOT DETECTED Final   Influenza B NOT DETECTED NOT DETECTED Final   Parainfluenza Virus 1 NOT DETECTED NOT DETECTED Final   Parainfluenza Virus 2 NOT DETECTED NOT DETECTED Final   Parainfluenza Virus 3 NOT DETECTED NOT DETECTED Final   Parainfluenza Virus 4 NOT DETECTED NOT DETECTED Final   Respiratory Syncytial Virus NOT DETECTED NOT DETECTED Final   Bordetella pertussis NOT DETECTED NOT DETECTED Final   Bordetella Parapertussis NOT DETECTED NOT DETECTED Final   Chlamydophila pneumoniae NOT DETECTED NOT DETECTED Final   Mycoplasma pneumoniae NOT DETECTED NOT DETECTED Final    Comment: Performed at [facility], [address] [place], [place] [address]    IMAGING RESULTS:  MUTIFOCAL AREAS OF NODULARITY WITH SOME CAVITATION    Sub PLEURAL CYSTIC CHANGES LOWER LOBE RT I have personally reviewed the films ? 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 ?MCD n tacrolimus and steroids  ___steroids can cause  leucocytosis  IDDM- not well controlled  CKD Hypoalbuminemia ________________________________________________ Discussed with patient, requesting provider and pulmonologist Discussed with her mom Note:  This document was prepared using Dragon voice recognition software and may include unintentional dictation errors.

## 2022-10-16 NOTE — Progress Notes (Signed)
[facility]  ROUNDING NOTE   Subjective:   Catherine [name]  is a 22 y.o. 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 Thedore Mins.  Patient was last seen in office on 09/20/2022.  Patient seen sitting up in bed States she's ready for discharge Tolerating small meals 2L [place] Mild lower extremity edema Adequate urine output, 1.6 L Creatinine stable   Objective:  Vital signs in last 24 hours:  Temp:  [97.5 F (36.4 C)-98.4 F (36.9 C)] 97.8 F (36.6 C) (04/15 0818) Pulse Rate:  [81-114] 83 (04/15 0800) Resp:  [20-31] 26 (04/15 0800) BP: (76-105)/(53-90) 98/76 (04/15 0800) SpO2:  [93 %-100 %] 100 % (04/15 0800) Weight:  [64.9 kg] 64.9 kg (04/15 0500)  Weight change: 2 kg Filed Weights   10/14/22 0425 10/15/22 0409 10/16/22 0500  Weight: 60.2 kg 62.9 kg 64.9 kg    Intake/Output: I/O last 3 completed shifts: In: 2020.6 [P.O.:1200; I.V.:531.3; IV Piggyback:289.3] Out: 2225 [Urine:2225]   Intake/Output this shift:  No intake/output data recorded.  Physical Exam: General: NAD, resting comfortably  Head: Normocephalic, atraumatic. Moist oral mucosal membranes  Eyes: Anicteric  Lungs:  Diminished in bases, normal effort, NCO2  Heart: Regular rate and rhythm  Abdomen:  Soft, nontender  Extremities:  1+ peripheral edema.  Neurologic: Alert and oriented, moving all four extremities  Skin: No lesions  Access: None    Basic Metabolic Panel: Recent Labs  Lab 10/12/22 0847 10/13/22 2314 10/14/22 0320 10/15/22 0256 10/16/22 0604  NA 133* 133* 132* 132* 136  K 3.6 3.4* 4.0 3.7 3.6  CL 109  100 103 107 108  CO2 17* 22 19* 18* 21*  GLUCOSE 185* 214* 229* 226* 141*  BUN 48* 34* 30* 46* 48*  CREATININE 1.44* 1.29* 1.22* 1.32* 1.25*  CALCIUM 7.4* 7.3* 6.8* 7.4* 7.8*  MG  --   --  1.4* 2.2 2.0  PHOS  --   --  4.0 4.2 2.9     Liver Function Tests: Recent Labs  Lab 10/12/22 0847 10/13/22 2314  AST 18 18  ALT 11 12  ALKPHOS 113 115  BILITOT 0.4 0.6  PROT 4.2* 4.7*  ALBUMIN <1.5* <1.5*    Recent Labs  Lab 10/13/22 2314  LIPASE 27    No results for input(s): "AMMONIA" in the last 168 hours.  CBC: Recent Labs  Lab 10/12/22 0847 10/13/22 2314 10/14/22 0457 10/15/22 0256 10/16/22 0604  WBC 17.1* 19.0* 18.4* 23.9* 20.8*  NEUTROABS  --  12.8*  --   --   --   HGB 13.2 13.0 12.6 11.1* 11.0*  HCT 42.0 40.6 40.3 34.5* 34.7*  MCV 84.7 84.4 85.2 83.7 84.4  PLT 533* 349 330 321 335     Cardiac Enzymes: No results for input(s): "CKTOTAL", "CKMB", "CKMBINDEX", "TROPONINI" in the last 168 hours.  BNP: Invalid input(s): "POCBNP"  CBG: Recent Labs  Lab 10/15/22 1540 10/15/22 2007 10/16/22 0044 10/16/22 0446 10/16/22 0837  GLUCAP 291* 310* 309* 154* 87     Microbiology: Results for orders placed or performed during the hospital encounter of 10/13/22  Urine Culture     Status: Abnormal   Collection Time: 10/13/22 11:49 PM   Specimen:  Urine, Clean Catch  Result Value Ref Range Status   Specimen Description   Final    URINE, CLEAN CATCH Performed at [facility], [address], [place], [place] [address]    Special Requests   Final    NONE Performed at [facility], [address] [place], [place] [address]    Culture (A)  Final    <10,000 COLONIES/mL INSIGNIFICANT GROWTH Performed at [facility], [address], [place], [place] [address]    Report Status 10/15/2022 FINAL  Final  Resp panel by RT-PCR (RSV, Flu A&B, Covid) Anterior Nasal Swab     Status: None   Collection Time: 10/13/22 11:51 PM   Specimen: Anterior  Nasal Swab  Result Value Ref Range Status   SARS Coronavirus 2 by RT PCR NEGATIVE NEGATIVE Final    Comment: (NOTE) SARS-CoV-2 target nucleic acids are NOT DETECTED.  The SARS-CoV-2 RNA is generally detectable in upper respiratory specimens during the acute phase of infection. The lowest concentration of SARS-CoV-2 viral copies this assay can detect is 138 copies/mL. A negative result does not preclude SARS-Cov-2 infection and should not be used as the sole basis for treatment or other patient management decisions. A negative result may occur with  improper specimen collection/handling, submission of specimen other than nasopharyngeal swab, presence of viral mutation(s) within the areas targeted by this assay, and inadequate number of viral copies(<138 copies/mL). A negative result must be combined with clinical observations, patient history, and epidemiological information. The expected result is Negative.  Fact Sheet for Patients:  BloggerCourse.com  Fact Sheet for Healthcare Providers:  SeriousBroker.it  This test is no t yet approved or cleared by the [place] FDA and  has been authorized for detection and/or diagnosis of SARS-CoV-2 by FDA under an Emergency Use Authorization (EUA). This EUA will remain  in effect (meaning this test can be used) for the duration of the COVID-19 declaration under Section 564(b)(1) of the Act, 21 U.S.C.section 360bbb-3(b)(1), unless the authorization is terminated  or revoked sooner.       Influenza A by PCR NEGATIVE NEGATIVE Final   Influenza B by PCR NEGATIVE NEGATIVE Final    Comment: (NOTE) The Xpert Xpress SARS-CoV-2/FLU/RSV plus assay is intended as an aid in the diagnosis of influenza from Nasopharyngeal swab specimens and should not be used as a sole basis for treatment. Nasal washings and aspirates are unacceptable for Xpert Xpress SARS-CoV-2/FLU/RSV testing.  Fact Sheet for  Patients: BloggerCourse.com  Fact Sheet for Healthcare Providers: SeriousBroker.it  This test is not yet approved or cleared by the [place] FDA and has been authorized for detection and/or diagnosis of SARS-CoV-2 by FDA under an Emergency Use Authorization (EUA). This EUA will remain in effect (meaning this test can be used) for the duration of the COVID-19 declaration under Section 564(b)(1) of the Act, 21 U.S.C. section 360bbb-3(b)(1), unless the authorization is terminated or revoked.     Resp Syncytial Virus by PCR NEGATIVE NEGATIVE Final    Comment: (NOTE) Fact Sheet for Patients: BloggerCourse.com  Fact Sheet for Healthcare Providers: SeriousBroker.it  This test is not yet approved or cleared by the [place] FDA and has been authorized for detection and/or diagnosis of SARS-CoV-2 by FDA under an Emergency Use Authorization (EUA). This EUA will remain in effect (meaning this test can be used) for the duration of the COVID-19 declaration under Section 564(b)(1) of the Act, 21 U.S.C. section 360bbb-3(b)(1), unless the authorization is terminated or revoked.  Performed at [facility], [address] [place], [place] [address]   Blood culture (routine x 2)     Status: None (Preliminary result)   Collection Time: 10/14/22  2:47 AM   Specimen: BLOOD  Result Value Ref Range Status   Specimen Description BLOOD BLOOD RIGHT ARM  Final   Special Requests   Final    BOTTLES DRAWN AEROBIC AND ANAEROBIC Blood Culture adequate volume   Culture   Final    NO GROWTH 2 DAYS Performed at [facility], [address] [place], [place] [address]    Report Status PENDING  Incomplete  Blood culture (routine x 2)     Status: None (Preliminary result)   Collection Time: 10/14/22  2:47 AM   Specimen: BLOOD  Result Value Ref Range Status   Specimen Description  BLOOD BLOOD RIGHT ARM  Final   Special Requests   Final    BOTTLES DRAWN AEROBIC AND ANAEROBIC Blood Culture adequate volume   Culture   Final    NO GROWTH 2 DAYS Performed at [facility], [address] [place], [place] [address]    Report Status PENDING  Incomplete  MRSA Next Gen by PCR, Nasal     Status: None   Collection Time: 10/14/22  4:34 AM   Specimen: Nasal Mucosa; Nasal Swab  Result Value Ref Range Status   MRSA by PCR Next Gen NOT DETECTED NOT DETECTED Final    Comment: (NOTE) The GeneXpert MRSA Assay (FDA approved for NASAL specimens only), is one component of a comprehensive MRSA colonization surveillance program. It is not intended to diagnose MRSA infection nor to guide or monitor treatment for MRSA infections. Test performance is not FDA approved in patients less than 73 years old. Performed at [facility], [address] [place], [place] [address]   Respiratory (~20 pathogens) panel by PCR     Status: None   Collection Time: 10/15/22 11:53 AM   Specimen: Nasopharyngeal Swab; Respiratory  Result Value Ref Range Status   Adenovirus NOT DETECTED NOT DETECTED Final   Coronavirus 229E NOT DETECTED NOT DETECTED Final    Comment: (NOTE) The Coronavirus on the Respiratory Panel, DOES NOT test for the novel  Coronavirus (2019 nCoV)    Coronavirus HKU1 NOT DETECTED NOT DETECTED Final   Coronavirus NL63 NOT DETECTED NOT DETECTED Final   Coronavirus OC43 NOT DETECTED NOT DETECTED Final   Metapneumovirus NOT DETECTED NOT DETECTED Final   Rhinovirus / Enterovirus NOT DETECTED NOT DETECTED Final   Influenza A NOT DETECTED NOT DETECTED Final   Influenza B NOT DETECTED NOT DETECTED Final   Parainfluenza Virus 1 NOT DETECTED NOT DETECTED Final   Parainfluenza Virus 2 NOT DETECTED NOT DETECTED Final   Parainfluenza Virus 3 NOT DETECTED NOT DETECTED Final   Parainfluenza Virus 4 NOT DETECTED NOT DETECTED Final   Respiratory Syncytial Virus NOT DETECTED  NOT DETECTED Final   Bordetella pertussis NOT DETECTED NOT DETECTED Final   Bordetella Parapertussis NOT DETECTED NOT DETECTED Final   Chlamydophila pneumoniae NOT DETECTED NOT DETECTED Final   Mycoplasma pneumoniae NOT DETECTED NOT DETECTED Final    Comment: Performed at [facility], [address] [place], [place] [address]    Coagulation Studies: Recent Labs    10/13/22 2314 10/14/22 0457  LABPROT 17.9* 18.1*  INR 1.5* 1.5*     Urinalysis: Recent Labs    10/13/22 2314  COLORURINE YELLOW*  LABSPEC 1.037*  PHURINE 6.0  GLUCOSEU 150*  HGBUR SMALL*  BILIRUBINUR NEGATIVE  KETONESUR NEGATIVE  PROTEINUR >=300*  NITRITE NEGATIVE  LEUKOCYTESUR NEGATIVE       Imaging: ECHOCARDIOGRAM COMPLETE  Result Date: 10/16/2022    ECHOCARDIOGRAM REPORT   Patient Name:   JIL PENLAND Date of Exam: 10/15/2022 Medical Rec #:  [id number]           Height:       59.0 in Accession #:    [id number]          Weight:       138.7 lb Date of Birth:  05-08-2001           BSA:          1.579 m Patient Age:    21 years            BP:           94/75 mmHg Patient Gender: F                   HR:           87 bpm. Exam Location:  [facility] Procedure: 2D Echo Indications:     CHF I50.31  History:         Patient has prior history of Echocardiogram examinations, most                  recent 08/22/2022.  Sonographer:     Overton Mam RDCS Referring Phys:  [id number] Gillis Santa Diagnosing Phys: Chilton Si MD IMPRESSIONS  1. Left ventricular ejection fraction, by estimation, is 60 to 65%. The left ventricle has normal function. The left ventricle has no regional wall motion abnormalities. There is mild concentric left ventricular hypertrophy. Left ventricular diastolic parameters were normal. There is the interventricular septum is flattened in systole and diastole, consistent with right ventricular pressure and volume overload.  2. Right ventricular systolic function is mildly reduced. The right  ventricular size is severely enlarged. There is severely elevated pulmonary artery systolic pressure.  3. A small pericardial effusion is present. The pericardial effusion is localized near the right ventricle and anterior to the right ventricle. There is no evidence of cardiac tamponade.  4. The mitral valve is normal in structure. No evidence of mitral valve regurgitation. No evidence of mitral stenosis.  5. Tricuspid valve regurgitation is severe.  6. The aortic valve is tricuspid. Aortic valve regurgitation is not visualized. No aortic stenosis is present.  7. Pulmonic valve regurgitation is moderate.  8. The inferior vena cava is normal in size with greater than 50% respiratory variability, suggesting right atrial pressure of 3 mmHg. FINDINGS  Left Ventricle: Left ventricular ejection fraction, by estimation, is 60 to 65%. The left ventricle has normal function. The left ventricle has no regional wall motion abnormalities. The left ventricular internal cavity size was normal in size. There is  mild concentric left ventricular hypertrophy. The interventricular septum is flattened in systole and diastole, consistent with right ventricular pressure and volume overload. Left ventricular diastolic parameters were normal. Indeterminate filling pressures. Right Ventricle: The right ventricular size is severely enlarged. No increase in right ventricular wall thickness. Right ventricular systolic function is mildly reduced. There is severely elevated pulmonary artery systolic pressure. The tricuspid regurgitant velocity is 5.12 m/s, and with an assumed right atrial pressure of 3 mmHg, the estimated right ventricular systolic pressure is 107.9 mmHg. Left Atrium: Left atrial size was normal in size. Right Atrium: Right atrial size was normal in size. Pericardium: A small  pericardial effusion is present. The pericardial effusion is localized near the right ventricle and anterior to the right ventricle. There is excessive  respiratory variation in the tricuspid valve spectral Doppler velocities. There is no evidence of cardiac tamponade. Mitral Valve: The mitral valve is normal in structure. No evidence of mitral valve regurgitation. No evidence of mitral valve stenosis. Tricuspid Valve: The tricuspid valve is normal in structure. Tricuspid valve regurgitation is severe. No evidence of tricuspid stenosis. Aortic Valve: The aortic valve is tricuspid. Aortic valve regurgitation is not visualized. No aortic stenosis is present. Aortic valve peak gradient measures 5.0 mmHg. Pulmonic Valve: The pulmonic valve was normal in structure. Pulmonic valve regurgitation is moderate. No evidence of pulmonic stenosis. Aorta: The aortic root is normal in size and structure. Venous: The inferior vena cava is normal in size with greater than 50% respiratory variability, suggesting right atrial pressure of 3 mmHg. IAS/Shunts: No atrial level shunt detected by color flow Doppler.  LEFT VENTRICLE PLAX 2D LVIDd:         2.40 cm     Diastology LVIDs:         1.70 cm     LV e' medial:    8.92 cm/s LV PW:         1.30 cm     LV E/e' medial:  9.2 LV IVS:        1.10 cm     LV e' lateral:   10.80 cm/s LVOT diam:     1.70 cm     LV E/e' lateral: 7.6 LV SV:         25 LV SV Index:   16 LVOT Area:     2.27 cm  LV Volumes (MOD) LV vol d, MOD A4C: 18.0 ml LV vol s, MOD A4C: 7.3 ml LV SV MOD A4C:     18.0 ml RIGHT VENTRICLE RV Basal diam:  4.40 cm RV S prime:     11.20 cm/s TAPSE (M-mode): 1.9 cm LEFT ATRIUM           Index       RIGHT ATRIUM           Index LA diam:      2.70 cm 1.71 cm/m  RA Area:     12.60 cm LA Vol (A2C): 14.6 ml 9.25 ml/m  RA Volume:   28.30 ml  17.93 ml/m LA Vol (A4C): 6.9 ml  4.39 ml/m  AORTIC VALVE                 PULMONIC VALVE AV Area (Vmax): 1.62 cm     PV Vmax:          0.85 m/s AV Vmax:        112.00 cm/s  PV Peak grad:     2.9 mmHg AV Peak Grad:   5.0 mmHg     PR End Diast Vel: 25.50 msec LVOT Vmax:      79.80 cm/s   RVOT Peak  grad:   1 mmHg LVOT Vmean:     48.000 cm/s LVOT VTI:       0.111 m  AORTA Ao Root diam: 2.70 cm        PULMONARY ARTERY Ao Asc diam:  2.10 cm        MPA diam:        2.40 cm MITRAL VALVE               TRICUSPID VALVE MV Area (PHT): 4.39 cm  TR Peak grad:   104.9 mmHg MV Decel Time: 173 msec    TR Vmax:        512.00 cm/s MV E velocity: 81.80 cm/s MV A velocity: 77.60 cm/s  SHUNTS MV E/A ratio:  1.05        Systemic VTI:  0.11 m                            Systemic Diam: 1.70 cm Chilton Si MD Electronically signed by Chilton Si MD Signature Date/Time: 10/16/2022/6:06:16 AM    Final      Medications:    sodium chloride Stopped (10/16/22 0634)   ceFEPime (MAXIPIME) IV 200 mL/hr at 10/16/22 0700   heparin 1,150 Units/hr (10/16/22 0700)    Chlorhexidine Gluconate Cloth  6 each Topical Daily   feeding supplement  237 mL Oral TID BM   furosemide  20 mg Intravenous Daily   guaiFENesin  600 mg Oral BID   insulin aspart  0-20 Units Subcutaneous Q4H   insulin glargine-yfgn  15 Units Subcutaneous QHS   methylPREDNISolone (SOLU-MEDROL) injection  40 mg Intravenous Daily   acetaminophen, benzonatate, chlorpheniramine-HYDROcodone, docusate sodium, ibuprofen, ipratropium-albuterol, menthol-cetylpyridinium, ondansetron (ZOFRAN) IV, polyethylene glycol  Assessment/ Plan:  Ms. SUMAIYAH MARKERT is a 22 y.o.  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 Most recent urine with 2.3 grams of proteinuria on 09/20/2022. With hypoalbuminemia.  Baseline creatinine 1.08  Creatinine remained stable with adequate urine  output.  Continue to hold immunosuppressants.   Pulmonary embolism: with hypoalbuminemia and nephrotic syndrome.  Was discharged home subcu Lovenox and Coumadin Heparin drip in place.    Hypotension, possibly secondary to infection.  Successfully weaned off levo.  IV furosemide 20 mg ordered daily.   Secondary Hyperparathyroidism: not currently on an activated vitamin D.  Corrected calcium 9.8.  5.  Diabetes mellitus, type 1.  Not well-controlled.  With chronic kidney disease.  A1c 9.1 on 08/25/2022.  Glucose is elevated at times due to steroid use.  Sliding scale managed by primary team.   LOS: 2   4/15/20249:49 AM

## 2022-10-16 NOTE — Progress Notes (Signed)
[facility] Progress Note  Patient: Catherine [name]    ZOX:[id number]  DOA: 10/13/2022     Date of Service: the patient was seen and examined on 10/16/2022  Chief Complaint  Patient presents with   Dizziness   Brief hospital course: 22 y.o female with significant multiple PMH of uncontrolled TIDM, CKD stage IIIa with minimal-change disease and nephrotic syndrome, secondary hyperparathyroidism, hypoalbuminemia, HLD, HTN, polysubstance abuse (tobacco abuse, marijuana use), recent pulmonary embolism with right heart strain and multiple pulmonary infarcts, and cavitary lesions who presented to the ED  on 10/13/22 with chief complaints of hemoptysis and dizziness.   On review of her chart, patient has had multiple admissions and ED visit for multiple medical issues.  From 06/30/2022 to 07/05/2022 patient was admitted at [facility] due to AKI on CKD, proteinuria, hypoalbuminemia.  Ultrasound DVT was negative patient started on Eliquis for prophylactic anticoagulation due to concerns for hypercoagulability secondary to hypoalbuminemia/nephrotic syndrome.  Patient apparently ran out of Eliquis and was admitted between 08/22/2022 to 08/25/2022 with acute submassive PE with right heart strain as well as cavitary lesions.  Patient previously received rituximab with QuantiFERON gold performed on 07/05/2022 which was negative for.  She was also started on Bactrim for prophylaxis.  Patient underwent thrombolysis and mechanical thrombectomy on 08/22/2022.  On 08/30/2022, patient returned to ED with shortness of breath given repeat CT chest angiogram showed interval development of consolidation and airspace disease and bulky bilateral pulmonary embolism with occlusion in the right middle lobe suggesting infarct she was restarted on heparin drip and was transitioned to Lovenox 1 mg per cake twice daily bridging to Coumadin Coumadin with INR goal of 2-3.  Patient was seen by ID for pulmonary infiltrates/cavitary lesions and  pulmonologist who did not recommend bronchoscopy at that time due to high risk. Patient followed up with pulmonologist on 09/12/2022 who recommended CT chest high-resolution for further evaluation.  Patient was seen in the ED again on 09/20/2022, 09/25/2022 and 10/12/22 with hemoptysis and hyperglycemia and lower extremity edema.   ED Course: Initial vital signs showed HR of 86 beats/minute, BP 94/53 mm Hg, the RR 22 breaths/minute, and the oxygen saturation 100% on RA  and a temperature of 98.95F (36.7C).    Pertinent Labs/Diagnostics Findings: Na+/ K+: 133/3.4 glucose: 214 BUN/Cr.:  34/1.29 albumin < 1.5 WBC:19.0 Hgb/Hct: 13.0/40.6  PCT: negative <0.10 Lactic acid:1.6 COVID PCR: Negative, Troponin: 37 BNP: 1182.8 CXR> right pleural effusion and ill-defined airspace disease throughout the right lung   Patient given 1.5L of fluids and started on broad-spectrum abx for suspected sepsis due to elevated white count and abnormal chest x-ray.  Repeat chest x-ray showed worsening diffuse patchy airspace opacity.  Patient received IV Lasix for possible volume overload.  Patient remained hypotensive and therefore was started on Levophed. PCCM consulted.  Patient was admitted to the ICU on 4/13, Levophed weaned off, currently vital stable.  Transferred to [facility] hospitalist service on 10/15/2022.  Nephrology is following, ID will be consulted, further management as below.   Assessment and Plan: #Sepsis due to multifocal pneumonia Prior CT showed cavitary lesions of upper lobes right greater than left.QuantiFERON gold performed on 07/05/2022 negative 4/13 CT chest: Bilateral pneumonia, right middle lobe masslike lesion could be septic emboli, atypical infection or vasculitis. Small bilateral pleural effusion Blood culture NGTD, strep pneumo antigen negative, S/p Levophed in the ICU weaned off, vital signs stable now S/p 2 L of NS/LR & Cefepime/ Vancomycin/ Azithromycin F/u Legionella,  Monitor WBC/ fever  curve  continue cefepime 2 g IV every 12 hourly TTE shows LVEF 60 to 65%, severely enlarged right ventricle, severely elevated pulmonary hypertension, severe tricuspid valve regurgitation, moderate pulmonary valve regurgitation, Small pericardial effusion, no cardiac tamponade ID consulted, recommended lung biopsy and discussion with pulmonologist    #Pulmonary Embolism with recurrent Hemoptysis S/p thrombolysis and thrombectomy 08/22/22 recent CT on 4/11 re demonstrated near close if PE of the right lung with elevated RV to LV ratio and new diffuse groundglass opacities and small bilateral pleural effusion, and multiple cavitary lesions -Supplemental oxygen as needed, to maintain SpO2 > 90% -INR 1.5, was taking Lovenox and Coumadin due to failure on Eliquis  -continue Heparin gtt, transition to Coumadin when stable -PRN Bronchodilators  -Continue steroids -Smoking Cessation counseling      #AKI on CKD I Proteinuria I Hx of Nephrotic Syndrome w/ Minimal Change Disease  Baseline creatinine 0.64, Cr on admission 1.29 slighty improved from 1.48 on 09/05/22 potential pre-renal AKI in the setting of hypotension From chart review it appears that she has received Cytoxan, CellCept, and Rituximab in the past.  -Hold home tacrolimus, check tac trough is pending -s/p 1 L LR in ED, takes Torsemide PRN at home -Prior Renal ultrasound on w/ mild bilateral renal pelviectasis and caliectasis -On Prednisone at home. Has required steroid tapers in the past (self-tapers by checking urine dipstick at home),  -Daily renal function panel and Mg  -Monitor strict I&O -Daily weights -Avoid nephrotoxic agents  -Nephrology consult 4/13 started Lasix 20 mg IV daily  #Poorly Controlled Type 1 DM:  Diagnosed at age 74. Complicated by minimal change disease on tacrolimus and PRN prednisone for flares  Last A1c 9.1. Follows with [facility].  Started Semglee 15 units nightly, continue NovoLog sliding scale,  continue diabetic diet, monitor CBG. ( home dose Lantus 30 units nightly??)    Chronic Problems HTN: not currently on medication at home  HLD: no longer taking Atorvastatin   Body mass index is 28.01 kg/m.  Interventions:       Diet: Carb modified diet DVT Prophylaxis: Therapeutic Anticoagulation with heparin IV infusion    Advance goals of care discussion: Full code  Family Communication: family was not present at bedside, at the time of interview.  The pt provided permission to discuss medical plan with the family. Opportunity was given to ask question and all questions were answered satisfactorily.   Disposition:  Pt is from Home, admitted with sepsis, PNA, still has resp failure, SOb, which precludes a safe discharge. Discharge to Home, when clinically stable.  Subjective: No significant events overnight, patient breathing is improving, still has mild cough with phlegm production, no any other active issues.  Resting comfortably.   Physical Exam: General: NAD, lying comfortably, mild shortness of breath Appear in no distress, affect appropriate Eyes: PERRLA ENT: Oral Mucosa Clear, moist  Neck: no JVD,  Cardiovascular: S1 and S2 Present, no Murmur,  Respiratory: Good air entry bilaterally, tachypneic, bilateral crackles, no significant wheezing appreciated Abdomen: Bowel Sound present, Soft and no tenderness,  Skin: no rashes Extremities: 2+ Pedal edema, no calf tenderness Neurologic: without any new focal findings Gait not checked due to patient safety concerns  Vitals:   10/16/22 0900 10/16/22 1000 10/16/22 1100 10/16/22 1200  BP: 100/80 92/70 100/77 103/75  Pulse: 88 87 92 85  Resp: (!) 24 (!) 25 (!) 22 (!) 28  Temp:    98.3 F (36.8 C)  TempSrc:    Oral  SpO2: 99% 99% 95%  98%  Weight:      Height:        Intake/Output Summary (Last 24 hours) at 10/16/2022 1433 Last data filed at 10/16/2022 1200 Gross per 24 hour  Intake 643.7 ml  Output 1600 ml  Net  -956.3 ml   Filed Weights   10/14/22 0425 10/15/22 0409 10/16/22 0500  Weight: 60.2 kg 62.9 kg 64.9 kg    Data Reviewed: I have personally reviewed and interpreted daily labs, tele strips, imagings as discussed above. I reviewed all nursing notes, pharmacy notes, vitals, pertinent old records I have discussed plan of care as described above with RN and patient/family.  CBC: Recent Labs  Lab 10/12/22 0847 10/13/22 2314 10/14/22 0457 10/15/22 0256 10/16/22 0604  WBC 17.1* 19.0* 18.4* 23.9* 20.8*  NEUTROABS  --  12.8*  --   --   --   HGB 13.2 13.0 12.6 11.1* 11.0*  HCT 42.0 40.6 40.3 34.5* 34.7*  MCV 84.7 84.4 85.2 83.7 84.4  PLT 533* 349 330 321 335   Basic Metabolic Panel: Recent Labs  Lab 10/12/22 0847 10/13/22 2314 10/14/22 0320 10/15/22 0256 10/16/22 0604  NA 133* 133* 132* 132* 136  K 3.6 3.4* 4.0 3.7 3.6  CL 109 100 103 107 108  CO2 17* 22 19* 18* 21*  GLUCOSE 185* 214* 229* 226* 141*  BUN 48* 34* 30* 46* 48*  CREATININE 1.44* 1.29* 1.22* 1.32* 1.25*  CALCIUM 7.4* 7.3* 6.8* 7.4* 7.8*  MG  --   --  1.4* 2.2 2.0  PHOS  --   --  4.0 4.2 2.9    Studies: No results found.  Scheduled Meds:  Chlorhexidine Gluconate Cloth  6 each Topical Daily   feeding supplement  237 mL Oral TID BM   furosemide  20 mg Intravenous Daily   guaiFENesin  600 mg Oral BID   insulin aspart  0-20 Units Subcutaneous Q4H   insulin glargine-yfgn  15 Units Subcutaneous QHS   methylPREDNISolone (SOLU-MEDROL) injection  40 mg Intravenous Daily   Continuous Infusions:  sodium chloride Stopped (10/16/22 0634)   ceFEPime (MAXIPIME) IV 200 mL/hr at 10/16/22 0700   heparin 1,150 Units/hr (10/16/22 0700)   PRN Meds: acetaminophen, benzonatate, chlorpheniramine-HYDROcodone, docusate sodium, ibuprofen, ipratropium-albuterol, menthol-cetylpyridinium, ondansetron (ZOFRAN) IV, polyethylene glycol  Time spent: 50 minutes  Author: Gillis Santa. MD [facility] 10/16/2022 2:34 PM  To  reach On-call, see care teams to locate the attending and reach out to them via [email or web address]. If 7PM-7AM, please contact night-coverage If you still have difficulty reaching the attending provider, please page the [facility] (Director on Call) for [facility] on amion for assistance.

## 2022-10-16 NOTE — Progress Notes (Signed)
ANTICOAGULATION CONSULT NOTE   Pharmacy Consult for Heparin  Indication: pulmonary embolus  No Known Allergies  Patient Measurements: Height:  (149.9 cm) Weight: 64.9 kg (143 lb 1.3 oz) IBW/kg (Calculated) : 43.2 Heparin Dosing Weight: 55.9 kg   Vital Signs: Temp: 98.4 F (36.9 C) (04/15 0100) Temp Source: Oral (04/15 0100) BP: 91/77 (04/15 0630) Pulse Rate: 82 (04/15 0645)  Labs: Recent Labs    10/13/22 2314 10/14/22 0056 10/14/22 0320 10/14/22 0457 10/14/22 1346 10/15/22 0256 10/15/22 0948 10/15/22 1548 10/16/22 0604  HGB 13.0  --   --  12.6  --  11.1*  --   --  11.0*  HCT 40.6  --   --  40.3  --  34.5*  --   --  34.7*  PLT 349  --   --  330  --  321  --   --  335  APTT  --   --   --  30  --   --   --   --   --   LABPROT 17.9*  --   --  18.1*  --   --   --   --   --   INR 1.5*  --   --  1.5*  --   --   --   --   --   HEPARINUNFRC  --   --   --   --    < > 0.28* 0.39 0.31 0.33  CREATININE 1.29*  --  1.22*  --   --  1.32*  --   --  1.25*  TROPONINIHS 37* 36*  --   --   --   --   --   --   --    < > = values in this interval not displayed.     Estimated Creatinine Clearance: 57.8 mL/min (A) (by C-G formula based on SCr of 1.25 mg/dL (H)).   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 20 tablet 0 prn   benzonatate (TESSALON PERLES) 100 MG capsule Take 1 capsule (100 mg total) by mouth 3 (three) times daily as needed for cough. 30 capsule 1 prn   calcitRIOL (ROCALTROL) 0.25 MCG capsule Take 0.25 mcg by mouth as directed. Take 1  capsule (0.25 mcg) three times a week.   10/11/2022   chlorpheniramine-HYDROcodone (TUSSIONEX) 10-8 MG/5ML Take 5 mLs by mouth every 12 (twelve) hours as needed for cough. 115 mL 0 prm   glucagon 1 MG injection Inject 1 mg into the muscle as needed.   prn   insulin aspart (NOVOLOG) 100 UNIT/ML injection Inject 5-8 Units into the skin 3 (three) times daily before meals. 7.2 mL 2 10/13/2022   ketorolac (TORADOL) 10 MG tablet Take 1 tablet (10 mg total) by mouth every 6 (six) hours as needed. 20 tablet 0 prn   LANTUS 100 UNIT/ML injection Inject 0.15 mLs (15 Units total) into the skin at bedtime. 4.5 mL 2 10/13/2022   Multiple Vitamin (MULTIVITAMIN WITH MINERALS) TABS tablet Take 1 tablet by  mouth daily.   10/13/2022   [EXPIRED] oxyCODONE (ROXICODONE) 5 MG immediate release tablet Take 1 tablet (5 mg total) by mouth every 8 (eight) hours as needed for up to 2 days. 6 tablet 0 prn   tacrolimus (PROGRAF) 1 MG capsule Take 2 capsules (2 mg total) by mouth 2 (two) times daily. 120 capsule 2 10/13/2022   torsemide (DEMADEX) 20 MG tablet Take 1 tablet (20 mg total) by mouth daily. 30 tablet 0 10/13/2022   Vitamin D, Ergocalciferol, (DRISDOL) 1.25 MG (50000 UNIT) CAPS capsule Take 50,000 Units by mouth every 7 (seven) days.   Past Week   warfarin (COUMADIN) 5 MG tablet Take 1 tablet (5 mg total) by mouth daily. 30 tablet 2 10/13/2022 at 1900   blood glucose meter kit and supplies Dispense based on patient and insurance preference. Use up to four times daily as directed. (FOR ICD-10 E10.9, E11.9). 1 each 0    enoxaparin (LOVENOX) 60 MG/0.6ML injection Inject 0.575 mLs (57.5 mg total) into the skin every 12 (twelve) hours. (Patient not taking: Reported on 10/14/2022) 14 mL 0 Not Taking   feeding supplement, GLUCERNA SHAKE, (GLUCERNA SHAKE) LIQD Take 237 mLs by mouth 2 (two) times daily between meals.  0    sulfamethoxazole-trimethoprim (BACTRIM DS) 800-160 MG tablet Take 1 tablet by mouth every Monday, Wednesday, and  Friday. (Patient not taking: Reported on 10/14/2022) 12 tablet 2 Completed Course   torsemide (DEMADEX) 20 MG tablet Take 1 tablet (20 mg total) by mouth daily. 30 tablet 0     Assessment: Pharmacy consulted to dose heparin for PE in this 22 year old female admitted with sepsis.  Per PTA med list, pt was on lovenox 57.5 mg SQ Q12H at some point but was no longer taking.  Pt was on Warfarin 5 mg PO daily,  last dose on 4/12 @ 1900.   4/13 1346 HL 0.14  4/13 2117 HL 0.25 4/14 0256 HL 0.28 4/14 0948 HL 0.39 4/14 1548 HL 0.31 4/15 0604 HL 0.33, therapeutic X 3    Goal of Therapy:  Heparin level 0.3-0.7 units/ml Monitor platelets by anticoagulation protocol: Yes   Plan: Heparin level is therapeutic X 3.  Will continue heparin infusion at 1150 units/hr. Will transition to daily heparin levels. Next heparin level tomorrow AM CBC daily while on heparin.    Scherrie Gerlach, PharmD Clinical Pharmacist  10/16/2022 7:14 AM

## 2022-10-16 NOTE — Progress Notes (Signed)
Bishop Limbo, NP notified of CBG >400. She is aware that pt was treated with 20 units SQ insulin and pt also has 15 units semglee ordered for 2200. No new orders received at this time.

## 2022-10-16 NOTE — Consult Note (Incomplete)
NAME: Catherine [name]  DOB: [birth year]  MRN: [id number]  Date/Time: 10/16/2022 10:16 AM  REQUESTING PROVIDER: Dr.Kumar Subjective:  REASON FOR CONSULT: pneumonia   Catherine [name] is a 22 y.o. with a history of IDDM diagnosed when 22 yrs old. 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, nodular cavitary lesions since feb 2024, Increased PAP,readmitted between 2/28-3/4 Presents to ED on 4/11 with dizziness, weakness, hemoptysis Vitals in the ED  10/12/22 08:38  BP 109/75  Temp 98.2 F (36.8 C)  Pulse Rate 89  Resp 18  SpO2 97 %    Latest Reference Range & Units 10/12/22 08:47  WBC 4.0 - 10.5 K/uL 17.1 (H)  Hemoglobin 12.0 - 15.0 g/dL 69.6  HCT 29.5 - 28.4 % 42.0  Platelets 150 - 400 K/uL 533 (H)  Creatinine 0.44 - 1.00 mg/dL 1.32 (H)    Pt has had recurrent hospitalization/visit to ED She has been treated with multiple courses of antibiotics with minimal  relief She has been sob She takes tacrolimus and steroids- compliance unclear  Past Medical History:  Diagnosis Date  . Diabetes mellitus without complication   . Dyslipidemia   . Hypoalbuminemia   . Marijuana abuse   . 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 Nephrotic syndrome 08/23/2019  . Tobacco dependence     Past Surgical History:  Procedure Laterality Date  . PULMONARY THROMBECTOMY Bilateral 08/22/2022   Procedure: PULMONARY THROMBECTOMY;  Surgeon: Annice Needy, MD;  Location: [facility] INVASIVE CV LAB;  Service: Cardiovascular;  Laterality: Bilateral;    Social History   Socioeconomic History  . Marital status: Single    Spouse name: Not on file  . Number of children: Not on file  . Years of education: Not on file  . Highest education level: Not on file  Occupational History  . 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 Smoking status: Former    Types: E-cigarettes    Quit date: 07/2022    Years since quitting: 0.2  . Smokeless tobacco: Never  . Tobacco comments:    Quit substance use since the beginning of the year   Vaping Use  . Vaping Use: Former  Substance and Sexual Activity  . Alcohol use: Not Currently  . Drug use: Yes    Types: Marijuana    Comment: Quit using marijuana approx earlier this year  . Sexual activity: Not Currently  Other Topics Concern  . Not on file  Social History Narrative  . Not on file   Social Determinants of Health   Financial Resource Strain: Not on file  Food Insecurity: No Food Insecurity (10/14/2022)   Hunger Vital Sign   . Worried About Programme researcher, broadcasting/film/video in the Last Year: Never true   . Ran Out of Food in the Last Year: Never true  Transportation Needs: No Transportation Needs (10/14/2022)   PRAPARE - Transportation   . Lack of Transportation (Medical): No   . Lack of Transportation (Non-Medical): No  Physical Activity: Not on file  Stress: Not on file  Social Connections: Not on file  Intimate Partner Violence: Not At Risk (10/14/2022)   Humiliation, Afraid, Rape, and Kick questionnaire   . Fear of Current or Ex-Partner: No   . Emotionally Abused: No   . Physically Abused: No   . Sexually  Abused: No    History reviewed. No pertinent family history. No Known Allergies I  Current Facility-Administered Medications  Medication Dose Route Frequency Provider Last Rate Last Admin  . 0.9 %  sodium chloride infusion  250 mL Intravenous Continuous Jimmye Norman, NP   Stopped at 10/16/22 [phone]  . acetaminophen (TYLENOL) tablet 650 mg  650 mg Oral Q4H PRN Erin Fulling, MD   650 mg at 10/16/22 0310  . benzonatate (TESSALON) capsule 100 mg  100 mg Oral TID PRN Gillis Santa, MD   100 mg at 10/15/22 1228  . ceFEPIme (MAXIPIME) 2 g in sodium chloride 0.9 % 100 mL IVPB  2 g Intravenous Q12H Orson Aloe, RPH 200 mL/hr at 10/16/22  0700 Infusion Verify at 10/16/22 0700  . Chlorhexidine Gluconate Cloth 2 % PADS 6 each  6 each Topical Daily Erin Fulling, MD   6 each at 10/15/22 1431  . chlorpheniramine-HYDROcodone (TUSSIONEX) 10-8 MG/5ML suspension 5 mL  5 mL Oral Q12H PRN Gillis Santa, MD      . docusate sodium (COLACE) capsule 100 mg  100 mg Oral BID PRN Jimmye Norman, NP      . feeding supplement (ENSURE ENLIVE / ENSURE PLUS) liquid 237 mL  237 mL Oral TID BM Gillis Santa, MD   237 mL at 10/15/22 1506  . furosemide (LASIX) injection 20 mg  20 mg Intravenous Daily Jimmye Norman, NP   20 mg at 10/15/22 1035  . guaiFENesin (MUCINEX) 12 hr tablet 600 mg  600 mg Oral BID Gillis Santa, MD   600 mg at 10/15/22 2201  . heparin ADULT infusion 100 units/mL (25000 units/263mL)  1,150 Units/hr Intravenous Continuous Erin Fulling, MD 11.5 mL/hr at 10/16/22 0700 1,150 Units/hr at 10/16/22 0700  . ibuprofen (ADVIL) tablet 800 mg  800 mg Oral Q6H PRN Erin Fulling, MD      . insulin aspart (novoLOG) injection 0-20 Units  0-20 Units Subcutaneous Q4H Jimmye Norman, NP   4 Units at 10/16/22 0500  . insulin glargine-yfgn (SEMGLEE) injection 15 Units  15 Units Subcutaneous QHS Gillis Santa, MD   15 Units at 10/15/22 2201  . ipratropium-albuterol (DUONEB) 0.5-2.5 (3) MG/3ML nebulizer solution 3 mL  3 mL Nebulization Q6H PRN Jimmye Norman, NP      . menthol-cetylpyridinium (CEPACOL) lozenge 3 mg  1 lozenge Oral PRN Gillis Santa, MD      . methylPREDNISolone sodium succinate (SOLU-MEDROL) 40 mg/mL injection 40 mg  40 mg Intravenous Daily Jimmye Norman, NP   40 mg at 10/15/22 1037  . ondansetron (ZOFRAN) injection 4 mg  4 mg Intravenous Q6H PRN Jimmye Norman, NP   4 mg at 10/16/22 0310  . polyethylene glycol (MIRALAX / GLYCOLAX) packet 17 g  17 g Oral Daily PRN Jimmye Norman, NP         Abtx:  Anti-infectives (From admission, onward)    Start     Dose/Rate Route Frequency  Ordered Stop   10/15/22 0800  vancomycin (VANCOCIN) IVPB 1000 mg/200 mL premix  Status:  Discontinued        1,000 mg 200 mL/hr over 60 Minutes Intravenous Every 24 hours 10/14/22 0645 10/14/22 1511   10/14/22 1848  ceFEPIme (MAXIPIME) 2 g in sodium chloride 0.9 % 100 mL IVPB        2 g 200 mL/hr over 30 Minutes Intravenous Every 12 hours 10/14/22 0954     10/14/22 0730  vancomycin (VANCOREADY) IVPB 1500 mg/300  mL        1,500 mg 150 mL/hr over 120 Minutes Intravenous  Once 10/14/22 0636 10/14/22 1229   10/14/22 0730  ceFEPIme (MAXIPIME) 2 g in sodium chloride 0.9 % 100 mL IVPB  Status:  Discontinued        2 g 200 mL/hr over 30 Minutes Intravenous Every 8 hours 10/14/22 0638 10/14/22 0954   10/14/22 0230  cefTRIAXone (ROCEPHIN) 2 g in sodium chloride 0.9 % 100 mL IVPB        2 g 200 mL/hr over 30 Minutes Intravenous  Once 10/14/22 0227 10/14/22 0357   10/14/22 0230  azithromycin (ZITHROMAX) 500 mg in sodium chloride 0.9 % 250 mL IVPB        500 mg 250 mL/hr over 60 Minutes Intravenous  Once 10/14/22 0227 10/14/22 0557       REVIEW OF SYSTEMS:  Const: negative fever, negative chills, negative weight loss Eyes: negative diplopia or visual changes, negative eye pain ENT: negative coryza, negative sore throat Resp: negative cough, hemoptysis, dyspnea Cards: negative for chest pain, palpitations, lower extremity edema GU: negative for frequency, dysuria and hematuria GI: Negative for abdominal pain, diarrhea, bleeding, constipation Skin: negative for rash and pruritus Heme: negative for easy bruising and gum/nose bleeding MS: negative for myalgias, arthralgias, back pain and muscle weakness Neurolo:negative for headaches, dizziness, vertigo, memory problems  Psych: negative for feelings of anxiety, depression  Endocrine: negative for thyroid, diabetes Allergy/Immunology- negative for any medication or food allergies   Pertinent Positives include : Objective:  VITALS:  BP 98/76    Pulse 83   Temp 97.8 F (36.6 C) (Oral)   Resp (!) 26   Ht  (1.499 m)   Wt 64.9 kg   LMP  (LMP Unknown)   SpO2 100%   BMI 28.90 kg/m  LDA Foley Central line Other drainage tubes PHYSICAL EXAM:  General: Alert, cooperative, no distress, appears stated age.  Head: Normocephalic, without obvious abnormality, atraumatic. Eyes: Conjunctivae clear, anicteric sclerae. Pupils are equal ENT Nares normal. No drainage or sinus tenderness. Lips, mucosa, and tongue normal. No Thrush Neck: Supple, symmetrical, no adenopathy, thyroid: non tender no carotid bruit and no JVD. Back: No CVA tenderness. Lungs: Clear to auscultation bilaterally. No Wheezing or Rhonchi. No rales. Heart: Regular rate and rhythm, no murmur, rub or gallop. Abdomen: Soft, non-tender,not distended. Bowel sounds normal. No masses Extremities: atraumatic, no cyanosis. No edema. No clubbing Skin: No rashes or lesions. Or bruising Lymph: Cervical, supraclavicular normal. Neurologic: Grossly non-focal Pertinent Labs Lab Results CBC    Component Value Date/Time   WBC 20.8 (H) 10/16/2022 0604   RBC 4.11 10/16/2022 0604   HGB 11.0 (L) 10/16/2022 0604   HGB 12.6 09/08/2022 1047   HCT 34.7 (L) 10/16/2022 0604   PLT 335 10/16/2022 0604   PLT 221 09/08/2022 1047   MCV 84.4 10/16/2022 0604   MCH 26.8 10/16/2022 0604   MCHC 31.7 10/16/2022 0604   RDW 15.6 (H) 10/16/2022 0604   LYMPHSABS 4.2 (H) 10/13/2022 2314   MONOABS 1.6 (H) 10/13/2022 2314   EOSABS 0.1 10/13/2022 2314   BASOSABS 0.1 10/13/2022 2314       Latest Ref Rng & Units 10/16/2022    6:04 AM 10/15/2022    2:56 AM 10/14/2022    3:20 AM  CMP  Glucose 70 - 99 mg/dL 161  096  045   BUN 6 - 20 mg/dL 48  46  30   Creatinine 0.44 - 1.00 mg/dL  1.25  1.32  1.22   Sodium 135 - 145 mmol/L 136  132  132   Potassium 3.5 - 5.1 mmol/L 3.6  3.7  4.0   Chloride 98 - 111 mmol/L 108  107  103   CO2 22 - 32 mmol/L 21  18  19    Calcium 8.9 - 10.3 mg/dL 7.8  7.4   6.8       Microbiology: Recent Results (from the past 240 hour(s))  SARS Coronavirus 2 by RT PCR (hospital order, performed in [facility] hospital lab) *cepheid single result test* Anterior Nasal Swab     Status: None   Collection Time: 10/12/22 11:44 AM   Specimen: Anterior Nasal Swab  Result Value Ref Range Status   SARS Coronavirus 2 by RT PCR NEGATIVE NEGATIVE Final    Comment: (NOTE) SARS-CoV-2 target nucleic acids are NOT DETECTED.  The SARS-CoV-2 RNA is generally detectable in upper and lower respiratory specimens during the acute phase of infection. The lowest concentration of SARS-CoV-2 viral copies this assay can detect is 250 copies / mL. A negative result does not preclude SARS-CoV-2 infection and should not be used as the sole basis for treatment or other patient management decisions.  A negative result may occur with improper specimen collection / handling, submission of specimen other than nasopharyngeal swab, presence of viral mutation(s) within the areas targeted by this assay, and inadequate number of viral copies (<250 copies / mL). A negative result must be combined with clinical observations, patient history, and epidemiological information.  Fact Sheet for Patients:   RoadLapTop.co.za  Fact Sheet for Healthcare Providers: http://kim-miller.com/  This test is not yet approved or  cleared by the [place] FDA and has been authorized for detection and/or diagnosis of SARS-CoV-2 by FDA under an Emergency Use Authorization (EUA).  This EUA will remain in effect (meaning this test can be used) for the duration of the COVID-19 declaration under Section 564(b)(1) of the Act, 21 U.S.C. section 360bbb-3(b)(1), unless the authorization is terminated or revoked sooner.  Performed at [facility], [address] [place], [place] [address]   Urine Culture     Status: Abnormal   Collection Time: 10/13/22  11:49 PM   Specimen: Urine, Clean Catch  Result Value Ref Range Status   Specimen Description   Final    URINE, CLEAN CATCH Performed at [facility], [address] [place], [place] [address]    Special Requests   Final    NONE Performed at [facility], [address], [place], [place] [address]    Culture (A)  Final    <10,000 COLONIES/mL INSIGNIFICANT GROWTH Performed at [facility], [address], [place], [place] [address]    Report Status 10/15/2022 FINAL  Final  Resp panel by RT-PCR (RSV, Flu A&B, Covid) Anterior Nasal Swab     Status: None   Collection Time: 10/13/22 11:51 PM   Specimen: Anterior Nasal Swab  Result Value Ref Range Status   SARS Coronavirus 2 by RT PCR NEGATIVE NEGATIVE Final    Comment: (NOTE) SARS-CoV-2 target nucleic acids are NOT DETECTED.  The SARS-CoV-2 RNA is generally detectable in upper respiratory specimens during the acute phase of infection. The lowest concentration of SARS-CoV-2 viral copies this assay can detect is 138 copies/mL. A negative result does not preclude SARS-Cov-2 infection and should not be used as the sole basis for treatment or other patient management decisions. A negative result may occur with  improper  specimen collection/handling, submission of specimen other than nasopharyngeal swab, presence of viral mutation(s) within the areas targeted by this assay, and inadequate number of viral copies(<138 copies/mL). A negative result must be combined with clinical observations, patient history, and epidemiological information. The expected result is Negative.  Fact Sheet for Patients:  BloggerCourse.com  Fact Sheet for Healthcare Providers:  SeriousBroker.it  This test is no t yet approved or cleared by the [place] FDA and  has been authorized for detection and/or diagnosis of SARS-CoV-2 by FDA under an Emergency Use Authorization (EUA).  This EUA will remain  in effect (meaning this test can be used) for the duration of the COVID-19 declaration under Section 564(b)(1) of the Act, 21 U.S.C.section 360bbb-3(b)(1), unless the authorization is terminated  or revoked sooner.       Influenza A by PCR NEGATIVE NEGATIVE Final   Influenza B by PCR NEGATIVE NEGATIVE Final    Comment: (NOTE) The Xpert Xpress SARS-CoV-2/FLU/RSV plus assay is intended as an aid in the diagnosis of influenza from Nasopharyngeal swab specimens and should not be used as a sole basis for treatment. Nasal washings and aspirates are unacceptable for Xpert Xpress SARS-CoV-2/FLU/RSV testing.  Fact Sheet for Patients: BloggerCourse.com  Fact Sheet for Healthcare Providers: SeriousBroker.it  This test is not yet approved or cleared by the [place] FDA and has been authorized for detection and/or diagnosis of SARS-CoV-2 by FDA under an Emergency Use Authorization (EUA). This EUA will remain in effect (meaning this test can be used) for the duration of the COVID-19 declaration under Section 564(b)(1) of the Act, 21 U.S.C. section 360bbb-3(b)(1), unless the authorization is terminated or revoked.     Resp Syncytial Virus by PCR NEGATIVE NEGATIVE Final    Comment: (NOTE) Fact Sheet for Patients: BloggerCourse.com  Fact Sheet for Healthcare Providers: SeriousBroker.it  This test is not yet approved or cleared by the [place] FDA and has been authorized for detection and/or diagnosis of SARS-CoV-2 by FDA under an Emergency Use Authorization (EUA). This EUA will remain in effect (meaning this test can be used) for the duration of the COVID-19 declaration under Section 564(b)(1) of the Act, 21 U.S.C. section 360bbb-3(b)(1), unless the authorization is terminated or revoked.  Performed at [facility], [address]  [place], [place] [address]   Blood culture (routine x 2)     Status: None (Preliminary result)   Collection Time: 10/14/22  2:47 AM   Specimen: BLOOD  Result Value Ref Range Status   Specimen Description BLOOD BLOOD RIGHT ARM  Final   Special Requests   Final    BOTTLES DRAWN AEROBIC AND ANAEROBIC Blood Culture adequate volume   Culture   Final    NO GROWTH 2 DAYS Performed at [facility], [address], [place], [place] [address]    Report Status PENDING  Incomplete  Blood culture (routine x 2)     Status: None (Preliminary result)   Collection Time: 10/14/22  2:47 AM   Specimen: BLOOD  Result Value Ref Range Status   Specimen Description BLOOD BLOOD RIGHT ARM  Final   Special Requests   Final    BOTTLES DRAWN AEROBIC AND ANAEROBIC Blood Culture adequate volume   Culture   Final    NO GROWTH 2 DAYS Performed at [facility], [address] [place], [place] [address]    Report Status PENDING  Incomplete  MRSA Next Gen by PCR, Nasal     Status: None   Collection  Time: 10/14/22  4:34 AM   Specimen: Nasal Mucosa; Nasal Swab  Result Value Ref Range Status   MRSA by PCR Next Gen NOT DETECTED NOT DETECTED Final    Comment: (NOTE) The GeneXpert MRSA Assay (FDA approved for NASAL specimens only), is one component of a comprehensive MRSA colonization surveillance program. It is not intended to diagnose MRSA infection nor to guide or monitor treatment for MRSA infections. Test performance is not FDA approved in patients less than 64 years old. Performed at [facility], [address] [place], [place] [address]   Respiratory (~20 pathogens) panel by PCR     Status: None   Collection Time: 10/15/22 11:53 AM   Specimen: Nasopharyngeal Swab; Respiratory  Result Value Ref Range Status   Adenovirus NOT DETECTED NOT DETECTED Final   Coronavirus 229E NOT DETECTED NOT DETECTED Final    Comment: (NOTE) The Coronavirus on the Respiratory Panel, DOES NOT test  for the novel  Coronavirus (2019 nCoV)    Coronavirus HKU1 NOT DETECTED NOT DETECTED Final   Coronavirus NL63 NOT DETECTED NOT DETECTED Final   Coronavirus OC43 NOT DETECTED NOT DETECTED Final   Metapneumovirus NOT DETECTED NOT DETECTED Final   Rhinovirus / Enterovirus NOT DETECTED NOT DETECTED Final   Influenza A NOT DETECTED NOT DETECTED Final   Influenza B NOT DETECTED NOT DETECTED Final   Parainfluenza Virus 1 NOT DETECTED NOT DETECTED Final   Parainfluenza Virus 2 NOT DETECTED NOT DETECTED Final   Parainfluenza Virus 3 NOT DETECTED NOT DETECTED Final   Parainfluenza Virus 4 NOT DETECTED NOT DETECTED Final   Respiratory Syncytial Virus NOT DETECTED NOT DETECTED Final   Bordetella pertussis NOT DETECTED NOT DETECTED Final   Bordetella Parapertussis NOT DETECTED NOT DETECTED Final   Chlamydophila pneumoniae NOT DETECTED NOT DETECTED Final   Mycoplasma pneumoniae NOT DETECTED NOT DETECTED Final    Comment: Performed at [facility], [address] [place], [place] [address]    IMAGING RESULTS:  MUTIFOCAL AREAS OF NODULARITY WITH SOME CAVITATION    Sub PLEURAL CYSTIC CHANGES LOWER LOBE RT I have personally reviewed the films   Impression/Recommendation       ___________________________________________________ Discussed with patient, requesting provider Note:  This document was prepared using Dragon voice recognition software and may include unintentional dictation errors.

## 2022-10-17 DIAGNOSIS — N179 Acute kidney failure, unspecified: Secondary | ICD-10-CM

## 2022-10-17 DIAGNOSIS — J9601 Acute respiratory failure with hypoxia: Secondary | ICD-10-CM

## 2022-10-17 DIAGNOSIS — I959 Hypotension, unspecified: Secondary | ICD-10-CM | POA: Diagnosis not present

## 2022-10-17 DIAGNOSIS — R918 Other nonspecific abnormal finding of lung field: Secondary | ICD-10-CM

## 2022-10-17 DIAGNOSIS — I2724 Chronic thromboembolic pulmonary hypertension: Secondary | ICD-10-CM

## 2022-10-17 DIAGNOSIS — A419 Sepsis, unspecified organism: Secondary | ICD-10-CM | POA: Diagnosis not present

## 2022-10-17 DIAGNOSIS — I2729 Other secondary pulmonary hypertension: Secondary | ICD-10-CM | POA: Diagnosis not present

## 2022-10-17 DIAGNOSIS — I272 Pulmonary hypertension, unspecified: Secondary | ICD-10-CM

## 2022-10-17 DIAGNOSIS — A403 Sepsis due to Streptococcus pneumoniae: Secondary | ICD-10-CM

## 2022-10-17 DIAGNOSIS — I2699 Other pulmonary embolism without acute cor pulmonale: Secondary | ICD-10-CM | POA: Diagnosis not present

## 2022-10-17 DIAGNOSIS — I50810 Right heart failure, unspecified: Secondary | ICD-10-CM | POA: Diagnosis not present

## 2022-10-17 DIAGNOSIS — R042 Hemoptysis: Secondary | ICD-10-CM

## 2022-10-17 LAB — CBC
HCT: 35.3 % — ABNORMAL LOW (ref 36.0–46.0)
Hemoglobin: 11.3 g/dL — ABNORMAL LOW (ref 12.0–15.0)
MCH: 27 pg (ref 26.0–34.0)
MCHC: 32 g/dL (ref 30.0–36.0)
MCV: 84.2 fL (ref 80.0–100.0)
Platelets: 354 10*3/uL (ref 150–400)
RBC: 4.19 MIL/uL (ref 3.87–5.11)
RDW: 15.4 % (ref 11.5–15.5)
WBC: 21 10*3/uL — ABNORMAL HIGH (ref 4.0–10.5)
nRBC: 0.1 % (ref 0.0–0.2)

## 2022-10-17 LAB — BASIC METABOLIC PANEL
Anion gap: 7 (ref 5–15)
BUN: 50 mg/dL — ABNORMAL HIGH (ref 6–20)
CO2: 22 mmol/L (ref 22–32)
Calcium: 7.9 mg/dL — ABNORMAL LOW (ref 8.9–10.3)
Chloride: 108 mmol/L (ref 98–111)
Creatinine, Ser: 1.19 mg/dL — ABNORMAL HIGH (ref 0.44–1.00)
GFR, Estimated: >60 mL/min (ref >60)
Glucose, Bld: 86 mg/dL (ref 70–99)
Potassium: 3.7 mmol/L (ref 3.5–5.1)
Sodium: 137 mmol/L (ref 135–145)

## 2022-10-17 LAB — MAGNESIUM: Magnesium: 1.7 mg/dL (ref 1.7–2.4)

## 2022-10-17 LAB — HEPARIN LEVEL (UNFRACTIONATED): Heparin Unfractionated: 0.47 IU/mL (ref 0.30–0.70)

## 2022-10-17 LAB — PHOSPHORUS: Phosphorus: 3.3 mg/dL (ref 2.5–4.6)

## 2022-10-17 LAB — GLUCOSE, CAPILLARY
Glucose-Capillary: 121 mg/dL — ABNORMAL HIGH (ref 70–99)
Glucose-Capillary: 50 mg/dL — ABNORMAL LOW (ref 70–99)
Glucose-Capillary: 132 mg/dL — ABNORMAL HIGH (ref 70–99)
Glucose-Capillary: 55 mg/dL — ABNORMAL LOW (ref 70–99)
Glucose-Capillary: 196 mg/dL — ABNORMAL HIGH (ref 70–99)
Glucose-Capillary: 337 mg/dL — ABNORMAL HIGH (ref 70–99)
Glucose-Capillary: 363 mg/dL — ABNORMAL HIGH (ref 70–99)

## 2022-10-17 LAB — PROCALCITONIN: Procalcitonin: 0.52 ng/mL

## 2022-10-17 MED ORDER — VITAMIN D (ERGOCALCIFEROL) 1.25 MG (50000 UNIT) PO CAPS
50000.0000 [IU] | ORAL_CAPSULE | ORAL | Status: DC
  Administered 2022-10-17: 50000 [IU] via ORAL
  Filled 2022-10-17: qty 1

## 2022-10-17 MED ORDER — PREDNISONE 10 MG PO TABS: 10.0000 mg | ORAL_TABLET | Freq: Every day | ORAL | Status: DC

## 2022-10-17 MED ORDER — INSULIN ASPART 100 UNIT/ML IJ SOLN
0.0000 [IU] | Freq: Every day | INTRAMUSCULAR | Status: DC
  Administered 2022-10-17: 5 [IU] via SUBCUTANEOUS
  Administered 2022-10-18: 4 [IU] via SUBCUTANEOUS
  Filled 2022-10-17 (×2): qty 1

## 2022-10-17 MED ORDER — INSULIN ASPART 100 UNIT/ML IJ SOLN
0.0000 [IU] | Freq: Three times a day (TID) | INTRAMUSCULAR | Status: DC
  Administered 2022-10-17: 2 [IU] via SUBCUTANEOUS
  Administered 2022-10-17 – 2022-10-18 (×2): 7 [IU] via SUBCUTANEOUS
  Administered 2022-10-18 (×2): 5 [IU] via SUBCUTANEOUS
  Filled 2022-10-17 (×5): qty 1

## 2022-10-17 MED ORDER — PREDNISONE 20 MG PO TABS
30.0000 mg | ORAL_TABLET | Freq: Every day | ORAL | Status: DC
  Administered 2022-10-18: 30 mg via ORAL
  Filled 2022-10-17: qty 1

## 2022-10-17 MED ORDER — PREDNISONE 20 MG PO TABS: 20.0000 mg | ORAL_TABLET | Freq: Every day | ORAL | Status: DC

## 2022-10-17 MED ORDER — INSULIN ASPART 100 UNIT/ML IJ SOLN
5.0000 [IU] | Freq: Three times a day (TID) | INTRAMUSCULAR | Status: DC
  Administered 2022-10-17 – 2022-10-18 (×5): 5 [IU] via SUBCUTANEOUS
  Filled 2022-10-17 (×5): qty 1

## 2022-10-17 MED ORDER — INSULIN GLARGINE-YFGN 100 UNIT/ML ~~LOC~~ SOLN
20.0000 [IU] | Freq: Every day | SUBCUTANEOUS | Status: DC
  Administered 2022-10-17: 20 [IU] via SUBCUTANEOUS
  Filled 2022-10-17: qty 0.2

## 2022-10-17 MED ORDER — ORAL CARE MOUTH RINSE: 15.0000 mL | OROMUCOSAL | Status: DC | PRN

## 2022-10-17 NOTE — Progress Notes (Signed)
[facility]  ROUNDING NOTE   Subjective:   Catherine [name]  is a 22 y.o. 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 Thedore Mins.  Patient was last seen in office on 09/20/2022.  Patient resting in bed Remains on 2L [place] Mild lower extremity edema Adequate urine output, 1375 mL Able to sit in chair yesterday   Objective:  Vital signs in last 24 hours:  Temp:  [97.8 F (36.6 C)-98.9 F (37.2 C)] 98.9 F (37.2 C) (04/16 0400) Pulse Rate:  [84-114] 91 (04/16 0900) Resp:  [17-32] 22 (04/16 0900) BP: (87-114)/(58-93) 95/71 (04/16 0900) SpO2:  [94 %-100 %] 99 % (04/16 0900) Weight:  [63.1 kg] 63.1 kg (04/16 0441)  Weight change: -1.8 kg Filed Weights   10/15/22 0409 10/16/22 0500 10/17/22 0441  Weight: 62.9 kg 64.9 kg 63.1 kg    Intake/Output: I/O last 3 completed shifts: In: 642.5 [I.V.:542.5; IV Piggyback:99.9] Out: 1925 [Urine:1925]   Intake/Output this shift:  Total I/O In: 360.8 [I.V.:160.8; IV Piggyback:200] Out: -   Physical Exam: General: NAD, resting comfortably  Head: Normocephalic, atraumatic. Moist oral mucosal membranes  Eyes: Anicteric  Lungs:  Diminished in bases, normal effort, NCO2  Heart: Regular rate and rhythm  Abdomen:  Soft, nontender  Extremities:  1+ peripheral edema.  Neurologic: Alert and oriented, moving all four extremities  Skin: No lesions  Access: None    Basic Metabolic Panel: Recent Labs  Lab 10/13/22 2314 10/14/22 0320 10/15/22 0256 10/16/22 0604 10/17/22 0449  NA 133* 132* 132* 136 137  K 3.4* 4.0 3.7 3.6 3.7  CL 100 103 107 108 108  CO2  22 19* 18* 21* 22  GLUCOSE 214* 229* 226* 141* 86  BUN 34* 30* 46* 48* 50*  CREATININE 1.29* 1.22* 1.32* 1.25* 1.19*  CALCIUM 7.3* 6.8* 7.4* 7.8* 7.9*  MG  --  1.4* 2.2 2.0 1.7  PHOS  --  4.0 4.2 2.9 3.3     Liver Function Tests: Recent Labs  Lab 10/12/22 0847 10/13/22 2314  AST 18 18  ALT 11 12  ALKPHOS 113 115  BILITOT 0.4 0.6  PROT 4.2* 4.7*  ALBUMIN <1.5* <1.5*    Recent Labs  Lab 10/13/22 2314  LIPASE 27    No results for input(s): "AMMONIA" in the last 168 hours.  CBC: Recent Labs  Lab 10/13/22 2314 10/14/22 0457 10/15/22 0256 10/16/22 0604 10/17/22 0449  WBC 19.0* 18.4* 23.9* 20.8* 21.0*  NEUTROABS 12.8*  --   --   --   --   HGB 13.0 12.6 11.1* 11.0* 11.3*  HCT 40.6 40.3 34.5* 34.7* 35.3*  MCV 84.4 85.2 83.7 84.4 84.2  PLT 349 330 321 335 354     Cardiac Enzymes: No results for input(s): "CKTOTAL", "CKMB", "CKMBINDEX", "TROPONINI" in the last 168 hours.  BNP: Invalid input(s): "POCBNP"  CBG: Recent Labs  Lab 10/16/22 2346 10/17/22 0319 10/17/22 0724 10/17/22 0748 10/17/22 0817  GLUCAP 322* 121* 50* 55* 132*     Microbiology: Results for orders placed or performed during the hospital encounter of 10/13/22  Urine Culture     Status: Abnormal   Collection Time: 10/13/22 11:49 PM   Specimen: Urine, Clean Catch  Result Value Ref Range Status   Specimen Description   Final    URINE, CLEAN CATCH Performed at [facility], [address], [place], [place] [address]    Special Requests   Final    NONE Performed at [facility], [address] [place], [place] [address]    Culture (A)  Final    <10,000 COLONIES/mL INSIGNIFICANT GROWTH Performed at [facility], [address] [place], [place] [address]    Report Status 10/15/2022 FINAL  Final  Resp panel by RT-PCR (RSV, Flu A&B, Covid) Anterior Nasal Swab     Status: None   Collection Time: 10/13/22 11:51 PM   Specimen: Anterior Nasal Swab  Result Value  Ref Range Status   SARS Coronavirus 2 by RT PCR NEGATIVE NEGATIVE Final    Comment: (NOTE) SARS-CoV-2 target nucleic acids are NOT DETECTED.  The SARS-CoV-2 RNA is generally detectable in upper respiratory specimens during the acute phase of infection. The lowest concentration of SARS-CoV-2 viral copies this assay can detect is 138 copies/mL. A negative result does not preclude SARS-Cov-2 infection and should not be used as the sole basis for treatment or other patient management decisions. A negative result may occur with  improper specimen collection/handling, submission of specimen other than nasopharyngeal swab, presence of viral mutation(s) within the areas targeted by this assay, and inadequate number of viral copies(<138 copies/mL). A negative result must be combined with clinical observations, patient history, and epidemiological information. The expected result is Negative.  Fact Sheet for Patients:  BloggerCourse.com  Fact Sheet for Healthcare Providers:  SeriousBroker.it  This test is no t yet approved or cleared by the [place] FDA and  has been authorized for detection and/or diagnosis of SARS-CoV-2 by FDA under an Emergency Use Authorization (EUA). This EUA will remain  in effect (meaning this test can be used) for the duration of the COVID-19 declaration under Section 564(b)(1) of the Act, 21 U.S.C.section 360bbb-3(b)(1), unless the authorization is terminated  or revoked sooner.       Influenza A by PCR NEGATIVE NEGATIVE Final   Influenza B by PCR NEGATIVE NEGATIVE Final    Comment: (NOTE) The Xpert Xpress SARS-CoV-2/FLU/RSV plus assay is intended as an aid in the diagnosis of influenza from Nasopharyngeal swab specimens and should not be used as a sole basis for treatment. Nasal washings and aspirates are unacceptable for Xpert Xpress SARS-CoV-2/FLU/RSV testing.  Fact Sheet for  Patients: BloggerCourse.com  Fact Sheet for Healthcare Providers: SeriousBroker.it  This test is not yet approved or cleared by the [place] FDA and has been authorized for detection and/or diagnosis of SARS-CoV-2 by FDA under an Emergency Use Authorization (EUA). This EUA will remain in effect (meaning this test can be used) for the duration of the COVID-19 declaration under Section 564(b)(1) of the Act, 21 U.S.C. section 360bbb-3(b)(1), unless the authorization is terminated or revoked.     Resp Syncytial Virus by PCR NEGATIVE NEGATIVE Final    Comment: (NOTE) Fact Sheet for Patients: BloggerCourse.com  Fact Sheet for Healthcare Providers: SeriousBroker.it  This test is not yet approved or cleared by the [place] FDA and has been authorized for detection and/or diagnosis of SARS-CoV-2 by FDA under an Emergency Use Authorization (EUA). This EUA will remain in effect (meaning this test can be used) for the duration of the COVID-19 declaration under Section 564(b)(1) of the Act, 21 U.S.C. section 360bbb-3(b)(1), unless the authorization is terminated or revoked.  Performed at [facility], [address] [place], [place] [address]   Blood culture (routine x 2)     Status: None (Preliminary result)   Collection Time: 10/14/22  2:47 AM   Specimen: BLOOD  Result Value Ref Range Status   Specimen Description BLOOD BLOOD RIGHT ARM  Final   Special Requests   Final    BOTTLES DRAWN AEROBIC AND ANAEROBIC Blood Culture adequate volume   Culture   Final    NO GROWTH 3 DAYS Performed at [facility], [address], [place], [place] [address]    Report Status PENDING  Incomplete  Blood culture (routine x 2)     Status: None (Preliminary result)   Collection Time: 10/14/22  2:47 AM   Specimen: BLOOD  Result Value Ref Range Status   Specimen Description  BLOOD BLOOD RIGHT ARM  Final   Special Requests   Final    BOTTLES DRAWN AEROBIC AND ANAEROBIC Blood Culture adequate volume   Culture   Final    NO GROWTH 3 DAYS Performed at [facility], [address] [place], [place] [address]    Report Status PENDING  Incomplete  MRSA Next Gen by PCR, Nasal     Status: None   Collection Time: 10/14/22  4:34 AM   Specimen: Nasal Mucosa; Nasal Swab  Result Value Ref Range Status   MRSA by PCR Next Gen NOT DETECTED NOT DETECTED Final    Comment: (NOTE) The GeneXpert MRSA Assay (FDA approved for NASAL specimens only), is one component of a comprehensive MRSA colonization surveillance program. It is not intended to diagnose MRSA infection nor to guide or monitor treatment for MRSA infections. Test performance is not FDA approved in patients less than 53 years old. Performed at [facility], [address] [place], [place] [address]   Respiratory (~20 pathogens) panel by PCR     Status: None   Collection Time: 10/15/22 11:53 AM   Specimen: Nasopharyngeal Swab; Respiratory  Result Value Ref Range Status   Adenovirus NOT DETECTED NOT DETECTED Final   Coronavirus 229E NOT DETECTED NOT DETECTED Final    Comment: (NOTE) The Coronavirus on the Respiratory Panel, DOES NOT test for the novel  Coronavirus (2019 nCoV)    Coronavirus HKU1 NOT DETECTED NOT DETECTED Final   Coronavirus NL63 NOT DETECTED NOT DETECTED Final   Coronavirus OC43 NOT DETECTED NOT DETECTED Final   Metapneumovirus NOT DETECTED NOT DETECTED Final   Rhinovirus / Enterovirus NOT DETECTED NOT DETECTED Final   Influenza A NOT DETECTED NOT DETECTED Final   Influenza B NOT DETECTED NOT DETECTED Final   Parainfluenza Virus 1 NOT DETECTED NOT DETECTED Final   Parainfluenza Virus 2 NOT DETECTED NOT DETECTED Final   Parainfluenza Virus 3 NOT DETECTED NOT DETECTED Final   Parainfluenza Virus 4 NOT DETECTED NOT DETECTED Final   Respiratory Syncytial Virus NOT DETECTED  NOT DETECTED Final   Bordetella pertussis NOT DETECTED NOT DETECTED Final   Bordetella Parapertussis NOT DETECTED NOT DETECTED Final   Chlamydophila pneumoniae NOT DETECTED NOT DETECTED Final   Mycoplasma pneumoniae NOT DETECTED NOT DETECTED Final    Comment: Performed at [facility], [address], [place], [place] [address]    Coagulation Studies: No results for input(s): "LABPROT", "INR" in the last 72 hours.   Urinalysis: No results for input(s): "COLORURINE", "LABSPEC", "PHURINE", "GLUCOSEU", "HGBUR", "BILIRUBINUR", "KETONESUR", "PROTEINUR", "UROBILINOGEN", "NITRITE", "LEUKOCYTESUR" in the last 72 hours.  Invalid input(s): "APPERANCEUR"     Imaging: ECHOCARDIOGRAM COMPLETE  Result Date: 10/16/2022    ECHOCARDIOGRAM REPORT   Patient Name:   KALEIA LONGHI Date of Exam: 10/15/2022 Medical Rec #:  [id number]           Height:       59.0 in Accession #:    [id number]          Weight:       138.7 lb Date of Birth:  09/22/2000           BSA:          1.579 m Patient Age:    21 years            BP:           94/75 mmHg Patient Gender: F                   HR:           87 bpm. Exam Location:  [facility] Procedure: 2D Echo Indications:     CHF I50.31  History:         Patient has prior history of Echocardiogram examinations, most                  recent 08/22/2022.  Sonographer:     Overton Mam RDCS Referring Phys:  [id number] Gillis Santa Diagnosing Phys: Chilton Si MD IMPRESSIONS  1. Left ventricular ejection fraction, by estimation, is 60 to 65%. The left ventricle has normal function. The left ventricle has no regional wall motion abnormalities. There is mild concentric left ventricular hypertrophy. Left ventricular diastolic parameters were normal. There is the interventricular septum is flattened in systole and diastole, consistent with right ventricular pressure and volume overload.  2. Right ventricular systolic function is mildly reduced. The right ventricular size is severely  enlarged. There is severely elevated pulmonary artery systolic pressure.  3. A small pericardial effusion is present. The pericardial effusion is localized near the right ventricle and anterior to the right ventricle. There is no evidence of cardiac tamponade.  4. The mitral valve is normal in structure. No evidence of mitral valve regurgitation. No evidence of mitral stenosis.  5. Tricuspid valve regurgitation is severe.  6. The aortic valve is tricuspid. Aortic valve regurgitation is not visualized. No aortic stenosis is present.  7. Pulmonic valve regurgitation is moderate.  8. The inferior vena cava is normal in size with greater than 50% respiratory variability, suggesting right atrial pressure of 3 mmHg. FINDINGS  Left Ventricle: Left ventricular ejection fraction, by estimation, is 60 to 65%. The left ventricle has normal function. The left ventricle has no regional wall motion abnormalities. The left ventricular internal cavity size was normal in size. There is  mild concentric left ventricular hypertrophy. The interventricular septum is flattened in systole and diastole, consistent with right ventricular pressure and volume overload. Left ventricular diastolic parameters were normal. Indeterminate filling pressures. Right Ventricle: The right ventricular size is severely enlarged. No increase in right ventricular wall thickness. Right ventricular systolic function is mildly reduced. There is severely elevated pulmonary artery systolic pressure. The tricuspid regurgitant velocity is 5.12 m/s, and with an assumed right atrial pressure of 3 mmHg, the estimated right ventricular systolic pressure is 107.9 mmHg. Left Atrium: Left atrial size was normal in size. Right Atrium: Right atrial size was normal in size. Pericardium: A small pericardial effusion is present. The pericardial effusion is localized near the right ventricle and anterior to the right ventricle. There is excessive respiratory variation in the  tricuspid valve spectral Doppler velocities. There is no evidence of cardiac tamponade. Mitral Valve: The mitral valve is normal in structure. No evidence of mitral valve regurgitation. No evidence of mitral valve stenosis. Tricuspid Valve: The tricuspid valve is normal in structure. Tricuspid valve regurgitation is severe. No evidence of tricuspid stenosis. Aortic Valve: The aortic valve is tricuspid. Aortic valve regurgitation is not visualized. No aortic stenosis is present. Aortic valve peak gradient measures 5.0 mmHg. Pulmonic Valve: The pulmonic valve was normal in structure. Pulmonic valve regurgitation is moderate. No evidence of pulmonic stenosis. Aorta: The aortic root is normal in size and structure. Venous: The inferior vena cava is normal in size with greater than 50% respiratory variability, suggesting right atrial pressure of 3 mmHg. IAS/Shunts: No atrial level shunt detected by color flow Doppler.  LEFT VENTRICLE PLAX 2D LVIDd:         2.40 cm     Diastology LVIDs:         1.70 cm     LV e' medial:    8.92 cm/s LV PW:         1.30 cm     LV E/e' medial:  9.2 LV IVS:        1.10 cm     LV e' lateral:   10.80 cm/s LVOT diam:     1.70 cm     LV E/e' lateral: 7.6 LV SV:         25 LV SV Index:   16 LVOT Area:     2.27 cm  LV Volumes (MOD) LV vol d, MOD A4C: 18.0 ml LV vol s, MOD A4C: 7.3 ml LV SV MOD A4C:     18.0 ml RIGHT VENTRICLE RV Basal diam:  4.40 cm RV S prime:     11.20 cm/s TAPSE (M-mode): 1.9 cm LEFT ATRIUM           Index       RIGHT ATRIUM           Index LA diam:      2.70 cm 1.71 cm/m  RA Area:     12.60 cm LA Vol (A2C): 14.6 ml 9.25 ml/m  RA Volume:   28.30 ml  17.93 ml/m LA Vol (A4C): 6.9 ml  4.39 ml/m  AORTIC VALVE                 PULMONIC VALVE AV Area (Vmax): 1.62 cm     PV Vmax:          0.85 m/s AV Vmax:        112.00 cm/s  PV Peak grad:     2.9 mmHg AV Peak Grad:   5.0 mmHg     PR End Diast Vel: 25.50 msec LVOT Vmax:      79.80 cm/s   RVOT Peak grad:   1 mmHg LVOT Vmean:      48.000 cm/s LVOT VTI:       0.111 m  AORTA Ao Root diam: 2.70 cm        PULMONARY ARTERY Ao Asc diam:  2.10 cm        MPA diam:        2.40 cm MITRAL VALVE               TRICUSPID VALVE MV Area (PHT): 4.39 cm    TR Peak grad:   104.9 mmHg MV Decel Time: 173 msec    TR Vmax:  512.00 cm/s MV E velocity: 81.80 cm/s MV A velocity: 77.60 cm/s  SHUNTS MV E/A ratio:  1.05        Systemic VTI:  0.11 m                            Systemic Diam: 1.70 cm Chilton Si MD Electronically signed by Chilton Si MD Signature Date/Time: 10/16/2022/6:06:16 AM    Final      Medications:    sodium chloride 10 mL/hr at 10/17/22 0824   ceFEPime (MAXIPIME) IV Stopped (10/17/22 1610)   heparin 1,150 Units/hr (10/17/22 0824)    Chlorhexidine Gluconate Cloth  6 each Topical Daily   feeding supplement  237 mL Oral TID BM   furosemide  20 mg Intravenous Daily   guaiFENesin  600 mg Oral BID   insulin aspart  0-5 Units Subcutaneous QHS   insulin aspart  0-9 Units Subcutaneous TID WC   insulin aspart  5 Units Subcutaneous TID WC   insulin glargine-yfgn  15 Units Subcutaneous QHS   methylPREDNISolone (SOLU-MEDROL) injection  40 mg Intravenous Daily   Vitamin D (Ergocalciferol)  50,000 Units Oral Q7 days   acetaminophen, benzonatate, chlorpheniramine-HYDROcodone, docusate sodium, ibuprofen, ipratropium-albuterol, menthol-cetylpyridinium, ondansetron (ZOFRAN) IV, mouth rinse, polyethylene glycol  Assessment/ Plan:  Ms. EBONE ALCIVAR is a 22 y.o.  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 Most recent urine with 2.3  grams of proteinuria on 09/20/2022. With hypoalbuminemia.  Baseline creatinine 1.08  Renal function remains stable, great UOP noted. Will continue to hold Tacrolimus due to possible infection. Will continue solumedrol for now.    Pulmonary embolism: with hypoalbuminemia and nephrotic syndrome.  Was discharged home subcu Lovenox and Coumadin Heparin drip in place. Pulmonology consulted and feel patient is at high risk for exsanguination if tissue biopsy pursued.     Hypotension, possibly secondary to infection.  Successfully weaned off levo.  IV furosemide 20 mg ordered daily.   Secondary Hyperparathyroidism: not currently on an activated vitamin D.  Corrected calcium acceptable  5.  Diabetes mellitus, type 1.  Not well-controlled.  With chronic kidney disease.  A1c 9.1 on 08/25/2022.  Glucose is elevated at times due to steroid use.  Sliding scale managed by primary team.   LOS: 3   4/16/202410:39 AM

## 2022-10-17 NOTE — Progress Notes (Addendum)
Inpatient Diabetes Program Recommendations  AACE/ADA: New Consensus Statement on Inpatient Glycemic Control (2015)  Target Ranges:  Prepandial:   less than 140 mg/dL      Peak postprandial:   less than 180 mg/dL (1-2 hours)      Critically ill patients:  140 - 180 mg/dL   Lab Results  Component Value Date   GLUCAP 132 (H) 10/17/2022   HGBA1C 9.1 (H) 08/25/2022    Review of Glycemic Control  Latest Reference Range & Units 10/16/22 19:49 10/16/22 20:16 10/16/22 21:38 10/16/22 23:46 10/17/22 03:19 10/17/22 07:24 10/17/22 07:48 10/17/22 08:17  Glucose-Capillary 70 - 99 mg/dL 161 (H) 096 (H) 045 (H) 322 (H) 121 (H) 50 (L) 55 (L) 132 (H)   Diabetes history: Type 1 DM  Outpatient Diabetes medications:  Lantus 30 units q HS, Novolog 5-8 units tid with meals  ** Novolog 1 units for every 8 carbs, Novolog 1 units for every 50 mg/dL >409 mg/dL [facility] endocrinology Current orders for Inpatient glycemic control:  Novolog resistant q 4 hours Semglee 15 units daily  Inpatient Diabetes Program Recommendations:    Note history of Type 1 DM. This is more difficult to control with steroids. Recommend reducing Novolog correction to sensitive tid with meals and HS and add Novolog meal coverage 5 units tid with meals. At home patient covers 1 unit/8 grams of CHO. Hopefully this will prevent the spikes of blood sugars in the evenings. Also consider increasing Semglee to 20 units daily.   Thanks,  Beryl Meager, RN, BC-ADM Inpatient Diabetes Coordinator Pager [phone]  (8a-5p)

## 2022-10-17 NOTE — Progress Notes (Signed)
Date of Admission:  10/13/2022      ID: Catherine [name] is a 22 y.o. female Principal Problem:   Sepsis associated hypotension    Subjective: Patient is out of ICU Sitting comfortably in bed No distress States she is feeling better  Medications:   Chlorhexidine Gluconate Cloth  6 each Topical Daily   furosemide  20 mg Intravenous Daily   guaiFENesin  600 mg Oral BID   insulin aspart  0-5 Units Subcutaneous QHS   insulin aspart  0-9 Units Subcutaneous TID WC   insulin aspart  5 Units Subcutaneous TID WC   insulin glargine-yfgn  20 Units Subcutaneous QHS   [START ON 10/18/2022] predniSONE  30 mg Oral Q breakfast   Followed by   Melene Muller ON 10/21/2022] predniSONE  20 mg Oral Q breakfast   Followed by   Melene Muller ON 10/24/2022] predniSONE  10 mg Oral Q breakfast   Vitamin D (Ergocalciferol)  50,000 Units Oral Q7 days    Objective: Vital signs in last 24 hours: Patient Vitals for the past 24 hrs:  BP Temp Temp src Pulse Resp SpO2 Weight  10/17/22 1600 91/71 -- -- 95 (!) 30 96 % --  10/17/22 1500 107/77 -- -- 98 (!) 28 95 % --  10/17/22 1300 104/67 -- -- 97 (!) 24 96 % --  10/17/22 1200 93/80 -- -- 99 (!) 28 92 % --  10/17/22 1100 96/70 -- -- (!) 126 (!) 31 95 % --  10/17/22 1000 98/69 -- -- 96 -- 94 % --  10/17/22 0900 95/71 -- -- 91 (!) 22 99 % --  10/17/22 0800 99/85 -- -- (!) 114 (!) 23 100 % --  10/17/22 0700 94/66 -- -- 84 (!) 25 100 % --  10/17/22 0600 93/63 -- -- 84 (!) 26 99 % --  10/17/22 0500 92/68 -- -- 86 (!) 24 99 % --  10/17/22 0441 -- -- -- 92 (!) 24 96 % 63.1 kg  10/17/22 0400 95/71 98.9 F (37.2 C) Oral 88 (!) 23 97 % --  10/17/22 0340 -- -- -- 87 (!) 27 97 % --  10/17/22 0300 94/64 -- -- 90 (!) 25 97 % --  10/17/22 0200 95/61 -- -- 85 17 97 % --  10/17/22 0100 97/65 -- -- 88 (!) 23 96 % --  10/17/22 0000 96/69 98.2 F (36.8 C) Oral 91 (!) 24 97 % --  10/16/22 2300 93/65 -- -- 99 (!) 29 98 % --  10/16/22 2200 (!) 87/67 -- -- 99 (!) 25 97 % --   10/16/22 2102 -- -- -- 94 (!) 31 98 % --  10/16/22 2100 100/72 -- -- 96 (!) 32 97 % --  10/16/22 2000 107/73 -- -- 95 (!) 26 100 % --  10/16/22 1900 (!) 109/93 97.8 F (36.6 C) Oral 91 (!) 30 99 % --  10/16/22 1805 (!) 105/92 -- -- (!) 105 (!) 25 94 % --  10/16/22 1800 -- -- -- 96 (!) 22 97 % --      PHYSICAL EXAM:  General: Alert, cooperative, no distress, appears stated age.  Head: Normocephalic, without obvious abnormality, atraumatic. Eyes: Conjunctivae clear, anicteric sclerae. Pupils are equal ENT Nares normal. No drainage or sinus tenderness. Lips, mucosa, and tongue normal. No Thrush Neck: Supple, symmetrical, no adenopathy, thyroid: non tender no carotid bruit and no JVD.  Lungs: Bilateral air entry Few crepitation scattered on both sides  heart: Tachycardia some ED. Abdomen: Soft, non-tender,not  distended. Bowel sounds normal. No masses Extremities: Some edema of the extremities   skin: No rashes or lesions. Or bruising Lymph: Cervical, supraclavicular normal. Neurologic: Grossly non-focal  Lab Results    Latest Ref Rng & Units 10/17/2022    4:49 AM 10/16/2022    6:04 AM 10/15/2022    2:56 AM  CBC  WBC 4.0 - 10.5 K/uL 21.0  20.8  23.9   Hemoglobin 12.0 - 15.0 g/dL 40.1  02.7  25.3   Hematocrit 36.0 - 46.0 % 35.3  34.7  34.5   Platelets 150 - 400 K/uL 354  335  321        Latest Ref Rng & Units 10/17/2022    4:49 AM 10/16/2022    6:04 AM 10/15/2022    2:56 AM  CMP  Glucose 70 - 99 mg/dL 86  664  403   BUN 6 - 20 mg/dL 50  48  46   Creatinine 0.44 - 1.00 mg/dL 4.74  2.59  5.63   Sodium 135 - 145 mmol/L 137  136  132   Potassium 3.5 - 5.1 mmol/L 3.7  3.6  3.7   Chloride 98 - 111 mmol/L 108  108  107   CO2 22 - 32 mmol/L Calcium 8.9 - 10.3 mg/dL 7.9  7.8  7.4       Microbiology: 10/14/2022 blood culture no growth 10/14/2022 nares MRSA no growth Respiratory panel by PCR negative   Studies/Results: CT chest multifocal areas of nodularity  with some cavitation right more than left Subpleural cystic changes right more than left   Assessment/Plan: 22 year old female with history of insulin-dependent diabetes mellitus, minimal-change disease causing nephrotic syndrome, recent massive pulmonary embolism needing mechanical thrombectomy, causing bilateral pulmonary infarcts right more than left with some cavitation.  She presented with weakness dizziness shortness of breath and cough.  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

## 2022-10-17 NOTE — Consult Note (Addendum)
NAME:  Catherine [name], MRN:  [id number], DOB:  20-Mar-2001, LOS: 3 ADMISSION DATE:  10/13/2022, CONSULTATION DATE:  10/17/2022 REFERRING MD:  Gillis Santa, MD, CHIEF COMPLAINT:  Pulmonary Infiltrates   History of Present Illness:  Catherine [name] is a 22 year old female with a past medical history of CKD secondary to nephrotic syndrome from minimal change disease and diabetic nephropathy (secondary to T1DM) who presents to the hospital on 10/13/2022 secondary to hemoptysis.   Patient has had multiple recent admissions secondary to pulmonary embolism.  She was admitted to [facility] over New Year's (06/30/2022 - 07/05/2022) at which point she was discharged on Eliquis for prophylaxis.  She reports running out of the Eliquis and not taking it after that.  She then presented to our hospital on 20 February with shortness of breath at which point she was diagnosed with pulmonary embolism.  She was found to have cavitary lung lesions on her chest CT. I saw her in consultation during said admission on 08/22/2022 at which point a workup for cavitary pulmonary lesions has returned negative (beta D glucan, Aspergillus galactomannan, cryptococcal antigen, ANA, c-ANCA, p-ANCA, Quant gold, respiratory cultures, and blood cultures).  She underwent thrombectomy and was discharged on Eliquis for the management of her PE only to represent on 08/30/2022 again with shortness of breath. Repeat CT scan also showed a right lower lobe infiltrate in addition to her cavitary lesions. She was seen in consultation and felt that the risk of procedural intervention (bronchoscopy) greatly outweigh the benefits.  She was started on broad-spectrum antibiotics and again infectious workup was unrevealing. 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  Patient was discharged on 09/05/2022 on coumadin with improvement.  I saw her in clinic on 09/12/2022  where she reported exertional dyspnea, but overall improvement in her symptoms. She was tolerating coumadin well at the time. In the interim, she was seen on 09/20/2022 for hemoptysis in the ED at which point the patient declined admission. She was seen on 09/25/2022 secondary to hyperglycemia and discharged home.   Patient then re-presented on 10/13/2022 secondary to hemoptysis. She was initially admitted to the ICU and subsequently transferred to the care of the hospitalist service for further management. Patient reports coming in due to streaks of blood mixed in with sputum. She denies any chest pain or tightness, denies any fevers, chills or night sweats. She continues to have exertional dyspnea, but this is improved compared to prior.  Pulmonology is consulted to help guide the management of the pulmonary infiltrates and ground glass noted on the chest CT.  Pertinent  Medical History   The patient has a history of diabetes as well as minimal-change disease and diabetic nephropathy resulting in CKD.  She is followed by nephrology outpatient for minimal change disease. Patient is maintained on prednisone and tacrolimus for immune suppression for her minimal change disease, and had previously received Rituximab. QuantGOLD sent on 07/05/2022 was negative. She was also started on Bactrim for prophylaxis. She also has a history of T1DM as well as recently diagnosed PE.   Patient lives with her grandfather. She was previously working Librarian, academic. Her grandfather has a dog that stays outside. She has no birds and no other exposures. Patient reports vaping marijuana products as well as nicotine products but had quit.  No other inhalational exposures reported.  Review of Systems  Constitutional:  Positive for malaise/fatigue. Negative for  chills, fever and weight loss.  Respiratory:  Positive for cough, hemoptysis and shortness of breath.   Cardiovascular:  Negative for chest pain.  Skin:  Negative for  rash.    Objective   Blood pressure 99/85, pulse (!) 114, temperature 98.9 F (37.2 C), temperature source Oral, resp. rate (!) 23, height 4\' 11"  (1.499 m), weight 63.1 kg, SpO2 100 %.        Intake/Output Summary (Last 24 hours) at 10/17/2022 0911 Last data filed at 10/17/2022 0824 Gross per 24 hour  Intake 751.53 ml  Output 1375 ml  Net -623.47 ml   Filed Weights   10/15/22 0409 10/16/22 0500 10/17/22 0441  Weight: 62.9 kg 64.9 kg 63.1 kg    Examination: Physical Exam Constitutional:      Appearance: Normal appearance.  HENT:     Head: Normocephalic.     Nose: Nose normal.     Mouth/Throat:     Mouth: Mucous membranes are moist.  Cardiovascular:     Rate and Rhythm: Regular rhythm. Tachycardia present.     Pulses: Normal pulses.     Heart sounds: Normal heart sounds.  Pulmonary:     Breath sounds: Rales (over the bases) present.  Neurological:     General: No focal deficit present.     Mental Status: She is alert and oriented to person, place, and time. Mental status is at baseline.      Assessment & Plan:   #Hemoptysis #Pulmonary masses #Pulmonary infarction #Cavitary lesion of lung   Initial chest CT on my review is notable for multiple foci of cavitary lesions in addition to mosaic attenuation with ground glass opacities.  Repeat chest CT again notable for the cavitary lesions in addition to an infiltrate/consolidation in the right lower lobe as well as ground glass opacities. There is also a finding of fine honey combs in the RLL that appears progressed from prior. Patient has no environmental exposures, thou does report a previous history of smoking and e-cigarette (quit February 2024).  Patient again presents with hemoptysis and continued shortness of breath with exertion. She is admitted to the medicine service for further workup. I have reviewed her multiple CT scans at length, and have asked our chest radiology colleagues to review the CT's and assist with  the differential diagnosis.   On my review of the literature, I have found case reports documenting cases with minimal change disease and sarcoidosis, but no associations with other interstitial lung diseases is reported.  On review of the images with our chest radiologists, the findings on the CT (dilated pulmonary arteries, webbing, ground glass, etc...) are consistent with findings seen in [facility] which is high on the differential given the findings of RV dysfunction and pulmonary hypertension on her echocardiogram. Overall, the differential diagnosis for Ms. Yoon' findings include infectious (bacterial, AFB, fungal, septic emboli) and non-infectious etiologies ([facility], sarcoidosis, infarct secondary to PE, vasculitis, isolated pauci-immune pulmonary capillaritis, organizing pneumonia, and langerhans cell histiocytosis).  Infectious workup has so far been negative, and she was seen again by Dr. Rivka Safer from ID and a repeat infectious workup has been sent. The auto-immune workup has been negative so far, and she remains on immune suppression, currently on methyl-prednisolone.  Certainly tissue biopsy and/or a BAL would help [place] to establish the diagnosis, but the patient's hemodynamics (RV dysfunction & elevated PASP) are compromised and she would not tolerate moderate sedation and/or anesthesia with high risk of cardiovascular collapse. Given her anti-coagulation and extremely elevated PA pressures  a transbronchial biopsy is contra-indicated as it could result in exsanguination. I would appreciate input from cardiology for workup of RV dysfunction/CTEPH as well as risk assessment and optimization.   Finally, Ms. Goldring is requesting a transfer to [facility], and our team will be available to help facilitate hand-off and/or transition of care with our pulmonology colleagues at any tertiary care center. If said transfer were to materialize, I would recommend considering a center with [facility] expertise.  This  plan of care was discussed with the patient, the patient's attending physician (Dr. Lucianne Muss) as well as our consulting colleagues from cardiology and infectious diseases (Dr. Okey Dupre and Dr. Rivka Safer)  Raechel Chute, MD [facility] 10/17/2022 9:11 AM   Labs   CBC: Recent Labs  Lab 10/13/22 2314 10/14/22 0457 10/15/22 0256 10/16/22 0604 10/17/22 0449  WBC 19.0* 18.4* 23.9* 20.8* 21.0*  NEUTROABS 12.8*  --   --   --   --   HGB 13.0 12.6 11.1* 11.0* 11.3*  HCT 40.6 40.3 34.5* 34.7* 35.3*  MCV 84.4 85.2 83.7 84.4 84.2  PLT 349 330 321 335 354    Basic Metabolic Panel: Recent Labs  Lab 10/13/22 2314 10/14/22 0320 10/15/22 0256 10/16/22 0604 10/17/22 0449  NA 133* 132* 132* 136 137  K 3.4* 4.0 3.7 3.6 3.7  CL 100 103 107 108 108  CO2 22 19* 18* 21* 22  GLUCOSE 214* 229* 226* 141* 86  BUN 34* 30* 46* 48* 50*  CREATININE 1.29* 1.22* 1.32* 1.25* 1.19*  CALCIUM 7.3* 6.8* 7.4* 7.8* 7.9*  MG  --  1.4* 2.2 2.0 1.7  PHOS  --  4.0 4.2 2.9 3.3   GFR: Estimated Creatinine Clearance: 59.9 mL/min (A) (by C-G formula based on SCr of 1.19 mg/dL (H)). Recent Labs  Lab 10/12/22 0847 10/13/22 2314 10/14/22 0247 10/14/22 0320 10/14/22 0457 10/14/22 2058 10/15/22 0256 10/16/22 0604 10/17/22 0449  PROCALCITON <0.10 <0.10  --   --   --   --   --  0.52  --   WBC 17.1* 19.0*  --   --  18.4*  --  23.9* 20.8* 21.0*  LATICACIDVEN  --   --  1.6 2.0*  --  1.5  --   --   --     Liver Function Tests: Recent Labs  Lab 10/12/22 0847 10/13/22 2314  AST 18 18  ALT 11 12  ALKPHOS 113 115  BILITOT 0.4 0.6  PROT 4.2* 4.7*  ALBUMIN <1.5* <1.5*   Recent Labs  Lab 10/13/22 2314  LIPASE 27   No results for input(s): "AMMONIA" in the last 168 hours.  ABG    Component Value Date/Time   PHART 7.10 (LL) 07/29/2019 0321   PCO2ART <19.0 (LL) 07/29/2019 0321   PO2ART 164 (H) 07/29/2019 0321   HCO3 15.7 (L) 09/25/2022 1304   ACIDBASEDEF 10.4 (H) 09/25/2022 1304   O2SAT  49.6 09/25/2022 1304     Coagulation Profile: Recent Labs  Lab 10/12/22 0805 10/13/22 2314 10/14/22 0457  INR 1.8* 1.5* 1.5*    Cardiac Enzymes: No results for input(s): "CKTOTAL", "CKMB", "CKMBINDEX", "TROPONINI" in the last 168 hours.  HbA1C: Hgb A1c MFr Bld  Date/Time Value Ref Range Status  08/25/2022 04:04 AM 9.1 (H) 4.8 - 5.6 % Final    Comment:    (NOTE) Pre diabetes:          5.7%-6.4%  Diabetes:              >6.4%  Glycemic control for   <7.0% adults with diabetes   09/04/2021 01:06 PM 13.1 (H) 4.8 - 5.6 % Final    Comment:    (NOTE) **Verified by repeat analysis**         Prediabetes: 5.7 - 6.4         Diabetes: >6.4         Glycemic control for adults with diabetes: <7.0     CBG: Recent Labs  Lab 10/16/22 2346 10/17/22 0319 10/17/22 0724 10/17/22 0748 10/17/22 0817  GLUCAP 322* 121* 50* 55* 132*    Past Medical History:  She,  has a past medical history of Diabetes mellitus without complication, Dyslipidemia, Hypoalbuminemia, Marijuana abuse, Minimal change disease (08/23/2019), Nephrotic syndrome (08/23/2019), and Tobacco dependence.   Surgical History:   Past Surgical History:  Procedure Laterality Date   PULMONARY THROMBECTOMY Bilateral 08/22/2022   Procedure: PULMONARY THROMBECTOMY;  Surgeon: Annice Needy, MD;  Location: [facility] INVASIVE CV LAB;  Service: Cardiovascular;  Laterality: Bilateral;     Social History:   reports that she quit smoking about 3 months ago. Her smoking use included e-cigarettes. She has never used smokeless tobacco. She reports that she does not currently use alcohol. She reports current drug use. Drug: Marijuana.   Family History:  Her family history is not on file.   Allergies No Known Allergies   Home Medications  Prior to Admission medications   Medication Sig Start Date End Date Taking? Authorizing Provider  acetaminophen (TYLENOL) 325 MG tablet Take 2 tablets (650 mg total) by mouth every 6 (six) hours  as needed for mild pain or headache. 09/05/22  Yes Djan, Scarlette Calico, MD  benzonatate (TESSALON PERLES) 100 MG capsule Take 1 capsule (100 mg total) by mouth 3 (three) times daily as needed for cough. 08/30/22  Yes Loleta Rose, MD  calcitRIOL (ROCALTROL) 0.25 MCG capsule Take 0.25 mcg by mouth as directed. Take 1 capsule (0.25 mcg) three times a week. 09/20/22  Yes [provider]  chlorpheniramine-HYDROcodone (TUSSIONEX) 10-8 MG/5ML Take 5 mLs by mouth every 12 (twelve) hours as needed for cough. 08/30/22  Yes Loleta Rose, MD  glucagon 1 MG injection Inject 1 mg into the muscle as needed. 02/26/19  Yes [provider]  insulin aspart (NOVOLOG) 100 UNIT/ML injection Inject 5-8 Units into the skin 3 (three) times daily before meals. 09/06/21 10/14/22 Yes Darlin Priestly, MD  ketorolac (TORADOL) 10 MG tablet Take 1 tablet (10 mg total) by mouth every 6 (six) hours as needed. 09/25/22  Yes Merwyn Katos, MD  LANTUS 100 UNIT/ML injection Inject 0.15 mLs (15 Units total) into the skin at bedtime. 09/05/22 12/04/22 Yes DjanScarlette Calico, MD  Multiple Vitamin (MULTIVITAMIN WITH MINERALS) TABS tablet Take 1 tablet by mouth daily. 08/25/22  Yes Darlin Priestly, MD  tacrolimus (PROGRAF) 1 MG capsule Take 2 capsules (2 mg total) by mouth 2 (two) times daily. 08/25/22 11/23/22 Yes Darlin Priestly, MD  torsemide (DEMADEX) 20 MG tablet Take 1 tablet (20 mg total) by mouth daily. 10/12/22 11/11/22 Yes Georga Hacking, MD  Vitamin D, Ergocalciferol, (DRISDOL) 1.25 MG (50000 UNIT) CAPS capsule Take 50,000 Units by mouth every 7 (seven) days. 07/10/22 07/10/23 Yes [provider]  warfarin (COUMADIN) 5 MG tablet Take 1 tablet (5 mg total) by mouth daily. 10/05/22  Yes Rickard Patience, MD  blood glucose meter kit and supplies Dispense based on patient and insurance preference. Use up to four times daily as directed. (FOR ICD-10 E10.9, E11.9).  09/06/21   Darlin Priestly, MD  enoxaparin (LOVENOX) 60 MG/0.6ML injection Inject 0.575 mLs (57.5 mg total)  into the skin every 12 (twelve) hours. Patient not taking: Reported on 10/14/2022 09/18/22   Rickard Patience, MD  feeding supplement, GLUCERNA SHAKE, (GLUCERNA SHAKE) LIQD Take 237 mLs by mouth 2 (two) times daily between meals. 08/25/22   Darlin Priestly, MD  sulfamethoxazole-trimethoprim (BACTRIM DS) 800-160 MG tablet Take 1 tablet by mouth every Monday, Wednesday, and Friday. Patient not taking: Reported on 10/14/2022 08/25/22 11/23/22  Darlin Priestly, MD  torsemide (DEMADEX) 20 MG tablet Take 1 tablet (20 mg total) by mouth daily. 08/25/22 09/24/22  Darlin Priestly, MD      I spent 85 minutes caring for this patient today, including preparing to see the patient, obtaining a medical history , reviewing a separately obtained history, performing a medically appropriate examination and/or evaluation, counseling and educating the patient/family/caregiver, referring and communicating with other health care professionals (not separately reported), documenting clinical information in the electronic health record, and independently interpreting results (not separately reported/billed) and communicating results to the patient/family/caregiver

## 2022-10-17 NOTE — Progress Notes (Signed)
ANTICOAGULATION CONSULT NOTE   Pharmacy Consult for Heparin  Indication: pulmonary embolus  No Known Allergies  Patient Measurements: Height:  (149.9 cm) Weight: 63.1 kg (139 lb 1.8 oz) IBW/kg (Calculated) : 43.2 Heparin Dosing Weight: 55.9 kg   Vital Signs: Temp: 98.9 F (37.2 C) (04/16 0400) Temp Source: Oral (04/16 0400) BP: 92/68 (04/16 0500) Pulse Rate: 86 (04/16 0500)  Labs: Recent Labs    10/15/22 0256 10/15/22 0948 10/15/22 1548 10/16/22 0604 10/17/22 0449  HGB 11.1*  --   --  11.0* 11.3*  HCT 34.5*  --   --  34.7* 35.3*  PLT 321  --   --  335 354  HEPARINUNFRC 0.28*   < > 0.31 0.33 0.47  CREATININE 1.32*  --   --  1.25* 1.19*   < > = values in this interval not displayed.     Estimated Creatinine Clearance: 59.9 mL/min (A) (by C-G formula based on SCr of 1.19 mg/dL (H)).   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 20 tablet 0 prn   benzonatate (TESSALON PERLES) 100 MG capsule Take 1 capsule (100 mg total) by mouth 3 (three) times daily as needed for cough. 30 capsule 1 prn   calcitRIOL (ROCALTROL) 0.25 MCG capsule Take 0.25 mcg by mouth as directed. Take 1 capsule (0.25 mcg) three times a week.   10/11/2022   chlorpheniramine-HYDROcodone (TUSSIONEX) 10-8 MG/5ML Take 5 mLs by mouth every 12 (twelve) hours as needed for cough. 115 mL 0 prm   glucagon 1 MG injection Inject 1 mg into the muscle as needed.   prn   insulin aspart (NOVOLOG) 100 UNIT/ML injection Inject 5-8 Units into the skin 3 (three) times daily before meals. 7.2  mL 2 10/13/2022   ketorolac (TORADOL) 10 MG tablet Take 1 tablet (10 mg total) by mouth every 6 (six) hours as needed. 20 tablet 0 prn   LANTUS 100 UNIT/ML injection Inject 0.15 mLs (15 Units total) into the skin at bedtime. 4.5 mL 2 10/13/2022   Multiple Vitamin (MULTIVITAMIN WITH MINERALS) TABS tablet Take 1 tablet by mouth daily.   10/13/2022   [EXPIRED] oxyCODONE (ROXICODONE) 5 MG immediate release tablet Take 1 tablet (5 mg total) by mouth every 8 (eight) hours as needed for up to 2 days. 6 tablet 0 prn   tacrolimus (PROGRAF) 1 MG capsule Take 2 capsules (2 mg total) by mouth 2 (two) times daily. 120 capsule 2 10/13/2022   torsemide (DEMADEX) 20 MG tablet Take 1 tablet (20 mg total) by mouth daily. 30 tablet 0 10/13/2022   Vitamin D, Ergocalciferol, (DRISDOL) 1.25 MG (50000 UNIT) CAPS capsule Take 50,000 Units by mouth every 7 (seven) days.   Past Week   warfarin (COUMADIN) 5 MG tablet Take 1 tablet (5 mg total) by mouth daily. 30 tablet 2 10/13/2022 at 1900   blood glucose meter kit and supplies Dispense based on patient and insurance preference. Use up to four times daily as directed. (FOR ICD-10 E10.9, E11.9). 1 each 0    enoxaparin (  LOVENOX) 60 MG/0.6ML injection Inject 0.575 mLs (57.5 mg total) into the skin every 12 (twelve) hours. (Patient not taking: Reported on 10/14/2022) 14 mL 0 Not Taking   feeding supplement, GLUCERNA SHAKE, (GLUCERNA SHAKE) LIQD Take 237 mLs by mouth 2 (two) times daily between meals.  0    sulfamethoxazole-trimethoprim (BACTRIM DS) 800-160 MG tablet Take 1 tablet by mouth every Monday, Wednesday, and Friday. (Patient not taking: Reported on 10/14/2022) 12 tablet 2 Completed Course   torsemide (DEMADEX) 20 MG tablet Take 1 tablet (20 mg total) by mouth daily. 30 tablet 0     Assessment: Pharmacy consulted to dose heparin for PE in this 22 year old female admitted with sepsis.  Per PTA med list, pt was on lovenox 57.5 mg SQ Q12H at some point but was no longer taking.  Pt  was on Warfarin 5 mg PO daily,  last dose on 4/12 @ 1900.   4/13 1346 HL 0.14  4/13 2117 HL 0.25 4/14 0256 HL 0.28 4/14 0948 HL 0.39 4/14 1548 HL 0.31 4/15 0604 HL 0.33, therapeutic X 3 4/16 0449 HL 0.47, therapeutic x 4   Goal of Therapy:  Heparin level 0.3-0.7 units/ml Monitor platelets by anticoagulation protocol: Yes   Plan: Heparin level is therapeutic X 4  Will continue heparin infusion at 1150 units/hr. Next heparin level tomorrow AM CBC daily while on heparin.   Otelia Sergeant, PharmD, [facility] 10/17/2022 6:06 AM

## 2022-10-17 NOTE — Progress Notes (Signed)
[facility] Progress Note  Patient: Catherine [name]    ZOX:[id number]  DOA: 10/13/2022     Date of Service: the patient was seen and examined on 10/17/2022  Chief Complaint  Patient presents with   Dizziness   Brief hospital course: 22 y.o female with significant multiple PMH of uncontrolled TIDM, CKD stage IIIa with minimal-change disease and nephrotic syndrome, secondary hyperparathyroidism, hypoalbuminemia, HLD, HTN, polysubstance abuse (tobacco abuse, marijuana use), recent pulmonary embolism with right heart strain and multiple pulmonary infarcts, and cavitary lesions who presented to the ED  on 10/13/22 with chief complaints of hemoptysis and dizziness.   On review of her chart, patient has had multiple admissions and ED visit for multiple medical issues.  From 06/30/2022 to 07/05/2022 patient was admitted at [facility] due to AKI on CKD, proteinuria, hypoalbuminemia.  Ultrasound DVT was negative patient started on Eliquis for prophylactic anticoagulation due to concerns for hypercoagulability secondary to hypoalbuminemia/nephrotic syndrome.  Patient apparently ran out of Eliquis and was admitted between 08/22/2022 to 08/25/2022 with acute submassive PE with right heart strain as well as cavitary lesions.  Patient previously received rituximab with QuantiFERON gold performed on 07/05/2022 which was negative for.  She was also started on Bactrim for prophylaxis.  Patient underwent thrombolysis and mechanical thrombectomy on 08/22/2022.  On 08/30/2022, patient returned to ED with shortness of breath given repeat CT chest angiogram showed interval development of consolidation and airspace disease and bulky bilateral pulmonary embolism with occlusion in the right middle lobe suggesting infarct she was restarted on heparin drip and was transitioned to Lovenox 1 mg per cake twice daily bridging to Coumadin Coumadin with INR goal of 2-3.  Patient was seen by ID for pulmonary infiltrates/cavitary lesions and  pulmonologist who did not recommend bronchoscopy at that time due to high risk. Patient followed up with pulmonologist on 09/12/2022 who recommended CT chest high-resolution for further evaluation.  Patient was seen in the ED again on 09/20/2022, 09/25/2022 and 10/12/22 with hemoptysis and hyperglycemia and lower extremity edema.   ED Course: Initial vital signs showed HR of 86 beats/minute, BP 94/53 mm Hg, the RR 22 breaths/minute, and the oxygen saturation 100% on RA  and a temperature of 98.16F (36.7C).    Pertinent Labs/Diagnostics Findings: Na+/ K+: 133/3.4 glucose: 214 BUN/Cr.:  34/1.29 albumin < 1.5 WBC:19.0 Hgb/Hct: 13.0/40.6  PCT: negative <0.10 Lactic acid:1.6 COVID PCR: Negative, Troponin: 37 BNP: 1182.8 CXR> right pleural effusion and ill-defined airspace disease throughout the right lung   Patient given 1.5L of fluids and started on broad-spectrum abx for suspected sepsis due to elevated white count and abnormal chest x-ray.  Repeat chest x-ray showed worsening diffuse patchy airspace opacity.  Patient received IV Lasix for possible volume overload.  Patient remained hypotensive and therefore was started on Levophed. PCCM consulted.  Patient was admitted to the ICU on 4/13, Levophed weaned off, currently vital stable.  Transferred to [facility] hospitalist service on 10/15/2022.  Nephrology is following, ID will be consulted, further management as below.   Assessment and Plan: #Sepsis due to multifocal pneumonia Prior CT showed cavitary lesions of upper lobes right greater than left.QuantiFERON gold performed on 07/05/2022 negative 4/13 CT chest: Bilateral pneumonia, right middle lobe masslike lesion could be septic emboli, atypical infection or vasculitis. Small bilateral pleural effusion Blood culture NGTD, strep pneumo antigen negative, Legionella negative S/p Levophed in the ICU weaned off, vital signs stable now S/p 2 L of NS/LR & Cefepime/ Vancomycin/ Azithromycin Monitor WBC/ fever  curve continue  cefepime 2 g IV every 12 hourly TTE shows LVEF 60 to 65%, severely enlarged right ventricle, severely elevated pulmonary hypertension, severe tricuspid valve regurgitation, moderate pulmonary valve regurgitation, Small pericardial effusion, no cardiac tamponade ID consulted, recommended lung biopsy and discussion with pulmonologist Bronchoscopy cannot be done in the setting due to right ventricular failure, severe pulm hypertension and tricuspid regurgitation.  Recommended to transfer to [facility] D/w cardiology, recommended that patient ((eventually will need a RHC -Recommend VQ scan for evaluation CTEPH -Small pericardial effusion was noted on echocardiogram without tamponade -Continued on furosemide 20 mg IV daily -avoid over dieresis  -Can consider repeat limited echo closer to discharge to reevaluate effusion -Patient requested transfer to [facility] to have continued workup completed there as her other providers are there)) 4/16 [facility] does not have any beds today, so we will try again tomorrow  #Pulmonary Embolism with recurrent Hemoptysis S/p thrombolysis and thrombectomy 08/22/22 recent CT on 4/11 re demonstrated near close if PE of the right lung with elevated RV to LV ratio and new diffuse groundglass opacities and small bilateral pleural effusion, and multiple cavitary lesions -Supplemental oxygen as needed, to maintain SpO2 > 90% -INR 1.5, was taking Lovenox and Coumadin due to failure on Eliquis  -continue Heparin gtt, transition to Coumadin when stable -PRN Bronchodilators  -s/p Solu-Medrol 40 mg for 3 days, started tapering prednisone from 4/17 -Smoking Cessation counseling      #AKI on CKD I Proteinuria I Hx of Nephrotic Syndrome w/ Minimal Change Disease  Baseline creatinine 0.64, Cr on admission 1.29 slighty improved from 1.48 on 09/05/22 potential pre-renal AKI in the setting of hypotension From chart review it appears that she has received Cytoxan, CellCept, and Rituximab  in the past.  -Hold home tacrolimus, check tac trough is pending -s/p 1 L LR in ED, takes Torsemide PRN at home -Prior Renal ultrasound on w/ mild bilateral renal pelviectasis and caliectasis -On Prednisone at home. Has required steroid tapers in the past (self-tapers by checking urine dipstick at home),  -Daily renal function panel and Mg  -Monitor strict I&O -Daily weights -Avoid nephrotoxic agents  -Nephrology consult 4/13 started Lasix 20 mg IV daily  # Poorly Controlled Type 1 DM:  Diagnosed at age 69. Complicated by minimal change disease on tacrolimus and PRN prednisone for flares  Last A1c 9.1. Follows with [facility] Endocrinology.  4/16 increased Semglee 20 units nightly, NovoLog 5 units 3 times daily and NovoLog sliding scale, continue diabetic diet, monitor CBG. ( home dose Lantus 30 units nightly??)   Vitamin D deficiency: started vitamin D 50,000 units p.o. weekly, follow with PCP to repeat vitamin D level after 3 to 6 months.  Chronic Problems HTN: not currently on medication at home  HLD: no longer taking Atorvastatin    Body mass index is 28.01 kg/m.  Interventions:       Diet: Carb modified diet DVT Prophylaxis: Therapeutic Anticoagulation with heparin IV infusion    Advance goals of care discussion: Full code  Family Communication: family was not present at bedside, at the time of interview.  The pt provided permission to discuss medical plan with the family. Opportunity was given to ask question and all questions were answered satisfactorily.   Disposition:  Pt is from Home, admitted with sepsis, PNA, still on Hep IV infusion, may need lung biopsy, and right heart cath, IV antibiotics, which precludes a safe discharge. Transfer to [facility], no beds available today, we will try tomorrow a.m. We will inform patient that  [facility] does not have any beds available today, and then patient/family can decide if they want to be transferred to another hospital.    Subjective: No  significant events overnight, patient breathing is improving, saturating 92% on room air.  Still having some cough and generalized weakness, no any other complaints.  Physical Exam: General: NAD, lying comfortably, mild shortness of breath Appear in no distress, affect appropriate Eyes: PERRLA ENT: Oral Mucosa Clear, moist  Neck: no JVD,  Cardiovascular: S1 and S2 Present, no Murmur,  Respiratory: Good air entry bilaterally, tachypneic, bilateral crackles, no significant wheezing appreciated Abdomen: Bowel Sound present, Soft and no tenderness,  Skin: no rashes Extremities: 1-2+ Pedal edema, no calf tenderness Neurologic: without any new focal findings Gait not checked due to patient safety concerns  Vitals:   10/17/22 1000 10/17/22 1100 10/17/22 1200 10/17/22 1300  BP: 98/69 96/70 93/80  104/67  Pulse: 96 (!) 126 99 97  Resp:  (!) 31 (!) 28 (!) 24  Temp:      TempSrc:      SpO2: 94% 95% 92% 96%  Weight:      Height:        Intake/Output Summary (Last 24 hours) at 10/17/2022 1349 Last data filed at 10/17/2022 1156 Gross per 24 hour  Intake 709.16 ml  Output 450 ml  Net 259.16 ml   Filed Weights   10/15/22 0409 10/16/22 0500 10/17/22 0441  Weight: 62.9 kg 64.9 kg 63.1 kg    Data Reviewed: I have personally reviewed and interpreted daily labs, tele strips, imagings as discussed above. I reviewed all nursing notes, pharmacy notes, vitals, pertinent old records I have discussed plan of care as described above with RN and patient/family.  CBC: Recent Labs  Lab 10/13/22 2314 10/14/22 0457 10/15/22 0256 10/16/22 0604 10/17/22 0449  WBC 19.0* 18.4* 23.9* 20.8* 21.0*  NEUTROABS 12.8*  --   --   --   --   HGB 13.0 12.6 11.1* 11.0* 11.3*  HCT 40.6 40.3 34.5* 34.7* 35.3*  MCV 84.4 85.2 83.7 84.4 84.2  PLT 349 330 321 335 354   Basic Metabolic Panel: Recent Labs  Lab 10/13/22 2314 10/14/22 0320 10/15/22 0256 10/16/22 0604 10/17/22 0449  NA 133* 132* 132* 136 137   K 3.4* 4.0 3.7 3.6 3.7  CL 100 103 107 108 108  CO2 22 19* 18* 21* 22  GLUCOSE 214* 229* 226* 141* 86  BUN 34* 30* 46* 48* 50*  CREATININE 1.29* 1.22* 1.32* 1.25* 1.19*  CALCIUM 7.3* 6.8* 7.4* 7.8* 7.9*  MG  --  1.4* 2.2 2.0 1.7  PHOS  --  4.0 4.2 2.9 3.3    Studies: No results found.  Scheduled Meds:  Chlorhexidine Gluconate Cloth  6 each Topical Daily   feeding supplement  237 mL Oral TID BM   furosemide  20 mg Intravenous Daily   guaiFENesin  600 mg Oral BID   insulin aspart  0-5 Units Subcutaneous QHS   insulin aspart  0-9 Units Subcutaneous TID WC   insulin aspart  5 Units Subcutaneous TID WC   insulin glargine-yfgn  20 Units Subcutaneous QHS   methylPREDNISolone (SOLU-MEDROL) injection  40 mg Intravenous Daily   Vitamin D (Ergocalciferol)  50,000 Units Oral Q7 days   Continuous Infusions:  sodium chloride Stopped (10/17/22 1018)   ceFEPime (MAXIPIME) IV Stopped (10/17/22 8295)   heparin 1,150 Units/hr (10/17/22 1156)   PRN Meds: acetaminophen, benzonatate, chlorpheniramine-HYDROcodone, docusate sodium, ibuprofen, ipratropium-albuterol, menthol-cetylpyridinium, ondansetron (ZOFRAN) IV, mouth rinse,  polyethylene glycol  Time spent: 50 minutes  Author: Gillis Santa. MD [facility] 10/17/2022 1:49 PM  To reach On-call, see care teams to locate the attending and reach out to them via [email or web address]. If 7PM-7AM, please contact night-coverage If you still have difficulty reaching the attending provider, please page the [facility] (Director on Call) for [facility] on amion for assistance.

## 2022-10-17 NOTE — Progress Notes (Signed)
Patient alert, on room air. Tolerating diet,1 assist to bedside commode. Patient being tx to floor. Report given to [facility].

## 2022-10-17 NOTE — Consult Note (Addendum)
Cardiology Consultation   Patient ID: Catherine [name] MRN: [id number]; DOB: Aug 02, 2000  Admit date: 10/13/2022 Date of Consult: 10/17/2022  PCP:  [facility]   [facility] Providers Cardiologist:  None      New consult done by Dr End  Patient Profile:   Catherine [name] is a 22 y.o. 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  History of Present Illness:   Catherine [name] is a 22 year old female with previously mentioned significant past medical history. She has multiple recent admissions secondary to pulmonary embolism.  She was admitted to [facility] over 2 years (06/21/2022 - 07/05/2022) which she was discharged on Eliquis.  She reports she ran out of Eliquis and was not taking it after that.  She reports to [facility] on February 24 shortness of breath she was diagnosed with pulmonary embolism.  She was found to have cavitary lung lesions on her chest CT.  She was followed up with pulmonary on 08/22/2022 for workup for cavitary pulmonary lesions had returned negative.  She underwent a thrombectomy and was discharged on Eliquis for the management of her PE only to be present on 08/2822 with continued shortness of breath.  Repeat CT scan showed right lower lobe infiltrate in addition to her cavitary lesions.  She was then seen in consultation by pulmonary and felt the risk of procedural intervention with bronchoscopy greatly outweigh the  benefits.  She was subsequently started on broad-spectrum antibiotics and again infectious workup was unrevealing.  She was followed by infectious disease specialist felt the lesions were secondary to lung infarction from her pulmonary embolism.  She was discharged from the facility again on 09/05/2022 on Coumadin.  She presented to the [facility] emergency room on 10/12/2022 with bilateral leg swelling since Monday.  She stated that she had been taking her torsemide but noticed worsening swelling and decreased urine output along with her current diagnosis of PE which she was on warfarin.  She stated she had chronic dyspnea that was unchanged today.  Denied any other associated symptoms of fevers and chills or chest discomfort.  Patient received IV torsemide and oxycodone and was scheduled follow-up with pulmonary and nephrology.  She was subsequently discharged home.  She presented back to the [facility] emergency department on 10/13/2022 with dizziness,fever of 100, nonproductive cough, chest tightness, shortness of breath, and hemoptysis.  She feels like the chest pain and shortness of breath feels similar to her previous episodes of pulmonary embolism.  She had recently been followed up as an outpatient and was recommended to increase her torsemide for the next 3 days.  Patient was found to be hypotensive hyperglycemic and increased lower extremity edema.  He was given 1/2 L of fluids and started on broad-spectrum antibiotics for suspected sepsis due to elevated white count and abnormal chest x-ray.  Repeat chest x-ray showed worsening diffuse patchy airspace opacity.  She received IV Lasix for possible volume overload.  She remained hypotensive and therefore was started on Levophed.  Critical care was consulted for further management.  Initial vital signs: Blood pressure 90/23, pulse  56, respirations 22, temperature 98  Pertinent labs: WBCs of 19, sodium 133, potassium 3.4, blood glucose of 214, BUN 34, serum  creatinine 1.29, calcium 7.3, INR 1.5, lactic acid of 2, BNP 1182.8, magnesium 1.4, high-sensitivity troponin 37 and 36,  Imaging: Chest x-ray revealed unchanged appearance of the chest with right pleural effusion and ill-defined airspace disease throughout the right lung; CT of the chest revealed large geographic area of progressive groundglass attenuation within the right upper lobe, small area of progressive subpleural groundglass attenuation is noted within the left upper lobe disease may represent alveolar hemorrhage, infection, or edema, multifocal areas of nodular masslike architectural distortion of internal cavitation are again noted, new area of nodular masslike architectural distortion within the right middle lobe and superior segment of the right lower lobe, unchanged small right pleural effusion and trace left pleural effusion, subpleural cystic changes are again seen within both lungs  Cardiology was consulted today for questionable right ventricular failure, severe tricuspid regurgitation, severely elevated pulmonary hypertension noted on echocardiogram.  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 Authorizing Provider  acetaminophen (TYLENOL) 325 MG tablet Take 2 tablets (650 mg total) by mouth every 6 (six) hours as needed for mild pain or headache. 09/05/22  Yes Djan, Scarlette Calico, MD  benzonatate (TESSALON PERLES) 100 MG capsule Take 1 capsule (100 mg total) by  mouth 3 (three) times daily as needed for cough. 08/30/22  Yes Loleta Rose, MD  calcitRIOL (ROCALTROL) 0.25 MCG capsule Take 0.25 mcg by mouth as directed. Take 1 capsule (0.25 mcg) three times a week. 09/20/22  Yes [provider]  chlorpheniramine-HYDROcodone (TUSSIONEX) 10-8 MG/5ML Take 5 mLs by mouth every 12 (twelve) hours as needed for cough. 08/30/22  Yes Loleta Rose, MD  glucagon 1 MG injection Inject 1 mg into the muscle as needed. 02/26/19  Yes [provider]  insulin aspart (NOVOLOG) 100 UNIT/ML injection Inject 5-8 Units into the skin 3 (three) times daily before meals. 09/06/21 10/14/22 Yes Darlin Priestly, MD  ketorolac (TORADOL) 10 MG tablet Take 1 tablet (10 mg total) by mouth every 6 (six) hours as needed. 09/25/22  Yes Merwyn Katos, MD  LANTUS 100 UNIT/ML injection Inject 0.15 mLs (15 Units total) into the skin at bedtime. 09/05/22 12/04/22 Yes DjanScarlette Calico, MD  Multiple Vitamin (MULTIVITAMIN WITH MINERALS) TABS tablet Take 1 tablet by mouth daily. 08/25/22  Yes Darlin Priestly, MD  tacrolimus (PROGRAF) 1 MG capsule Take 2 capsules (2 mg total) by mouth 2 (two) times daily. 08/25/22 11/23/22 Yes Darlin Priestly, MD  torsemide (DEMADEX) 20 MG tablet Take 1 tablet (20 mg total) by mouth daily. 10/12/22 11/11/22 Yes Georga Hacking, MD  Vitamin D, Ergocalciferol, (DRISDOL) 1.25 MG (50000 UNIT) CAPS capsule Take 50,000 Units by mouth every 7 (seven) days. 07/10/22 07/10/23 Yes [provider]  warfarin (COUMADIN) 5 MG tablet Take 1 tablet (5 mg total) by mouth daily. 10/05/22  Yes Rickard Patience, MD  blood glucose meter kit and supplies Dispense based on patient and insurance preference. Use up to four times daily as directed. (FOR ICD-10 E10.9, E11.9). 09/06/21   Darlin Priestly, MD  enoxaparin (LOVENOX) 60 MG/0.6ML injection Inject 0.575 mLs (57.5 mg total) into the skin every 12 (twelve) hours. Patient not taking: Reported on 10/14/2022 09/18/22   Rickard Patience, MD  feeding supplement, GLUCERNA SHAKE,  (GLUCERNA SHAKE) LIQD Take 237 mLs by mouth 2 (two) times daily between meals. 08/25/22   Darlin Priestly, MD  sulfamethoxazole-trimethoprim (BACTRIM DS) 800-160 MG tablet Take 1 tablet by mouth every Monday, Wednesday, and Friday. Patient not taking: Reported on 10/14/2022 08/25/22 11/23/22  Darlin Priestly, MD  torsemide (DEMADEX) 20 MG tablet Take 1 tablet (20 mg total) by mouth daily. 08/25/22 09/24/22  Darlin Priestly, MD    Inpatient Medications: Scheduled Meds:  Chlorhexidine Gluconate Cloth  6 each Topical Daily   feeding supplement  237 mL Oral TID BM   furosemide  20 mg Intravenous Daily   guaiFENesin  600 mg Oral BID   insulin aspart  0-20 Units Subcutaneous Q4H   insulin glargine-yfgn  15 Units Subcutaneous QHS   methylPREDNISolone (SOLU-MEDROL) injection  40 mg Intravenous Daily   Continuous Infusions:  sodium chloride 10 mL/hr at 10/16/22 1800   ceFEPime (MAXIPIME) IV 2 g (10/17/22 0605)   heparin 1,150 Units/hr (10/17/22 0600)   PRN Meds: acetaminophen, benzonatate, chlorpheniramine-HYDROcodone, docusate sodium, ibuprofen, ipratropium-albuterol, menthol-cetylpyridinium, ondansetron (ZOFRAN) IV, polyethylene glycol  Allergies:   No Known Allergies  Social History:   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  Tobacco Use   Smoking status: Former    Types: E-cigarettes    Quit date: 07/2022    Years since quitting: 0.2   Smokeless tobacco: Never   Tobacco comments:    Quit substance use since the beginning of the year   Vaping Use   Vaping Use: Former  Substance and Sexual Activity   Alcohol use: Not Currently   Drug use: Yes    Types: Marijuana    Comment: Quit using marijuana approx earlier this year   Sexual activity: Not Currently  Other Topics Concern   Not on  file  Social History Narrative   Not on file   Social Determinants of Health   Financial Resource Strain: Not on file  Food Insecurity: No Food Insecurity (10/14/2022)   Hunger Vital Sign    Worried About Running Out of Food in the Last Year: Never true    Ran Out of Food in the Last Year: Never true  Transportation Needs: No Transportation Needs (10/14/2022)   PRAPARE - Administrator, Civil Service (Medical): No    Lack of Transportation (Non-Medical): No  Physical Activity: Not on file  Stress: Not on file  Social Connections: Not on file  Intimate Partner Violence: Not At Risk (10/14/2022)   Humiliation, Afraid, Rape, and Kick questionnaire    Fear of Current or Ex-Partner: No    Emotionally Abused: No    Physically Abused: No    Sexually Abused: No    Family History:   History reviewed. No pertinent family history.   ROS:  Please see the history of present illness.  Review of Systems  Constitutional:  Positive for fever and malaise/fatigue.  Respiratory:  Positive for cough, hemoptysis and shortness of breath.   Cardiovascular:  Positive for leg swelling.  Gastrointestinal:  Positive for abdominal pain, nausea and vomiting.  Neurological:  Positive for dizziness and weakness.    All other ROS reviewed and negative.     Physical Exam/Data:   Vitals:   10/17/22 0400 10/17/22 0441 10/17/22 0500 10/17/22 0600  BP: 95/71  92/68 93/63  Pulse: 88 92 86 84  Resp: (!) 23 (!) 24 (!) 24 (!) 26  Temp: 98.9 F (37.2 C)     TempSrc: Oral     SpO2: 97% 96% 99% 99%  Weight:  63.1 kg    Height:        Intake/Output Summary (Last 24 hours) at 10/17/2022 0737 Last data filed at 10/17/2022 0600 Gross per 24 hour  Intake 390.73 ml  Output 1375 ml  Net -984.27 ml      10/17/2022    4:41 AM 10/16/2022    5:00 AM 10/15/2022    4:09 AM  Last 3 Weights  Weight (lbs) 139 lb 1.8 oz 143 lb 1.3 oz 138 lb 10.7 oz  Weight (kg) 63.1 kg 64.9 kg 62.9 kg     Body mass index  is 28.1 kg/m.  General:  Well nourished, well developed, in no acute distress HEENT: normal Neck: + JVD Vascular: No carotid bruits; Distal pulses 2+ bilaterally Cardiac:  normal S1, S2; RRR; no murmur  Lungs: Diminished to auscultation bilaterally, no wheezing, rhonchi or rales, respirations are unlabored at rest on 2 L of O2 via nasal cannula Abd: soft, nontender, no hepatomegaly  Ext: 1+ edema Musculoskeletal:  No deformities, BUE and BLE strength normal and equal Skin: warm and dry  Neuro:  CNs 2-12 intact, no focal abnormalities noted Psych:  Normal affect   EKG:  The EKG was personally reviewed and demonstrates: Study from 10/16/2022 revealed sinus tach with a rate of 109 with nonspecific T wave changes in lateral leads Telemetry:  Telemetry was personally reviewed and demonstrates:  sinus rates 80-90  Relevant CV Studies:   TTE 10/15/22  1. Left ventricular ejection fraction, by estimation, is 60 to 65%. The  left ventricle has normal function. The left ventricle has no regional  wall motion abnormalities. There is mild concentric left ventricular  hypertrophy. Left ventricular diastolic  parameters were normal. There is the interventricular septum is flattened  in systole and diastole, consistent with right ventricular pressure and  volume overload.   2. Right ventricular systolic function is mildly reduced. The right  ventricular size is severely enlarged. There is severely elevated  pulmonary artery systolic pressure.   3. A small pericardial effusion is present. The pericardial effusion is  localized near the right ventricle and anterior to the right ventricle.  There is no evidence of cardiac tamponade.   4. The mitral valve is normal in structure. No evidence of mitral valve  regurgitation. No evidence of mitral stenosis.   5. Tricuspid valve regurgitation is severe.   6. The aortic valve is tricuspid. Aortic valve regurgitation is not  visualized. No aortic stenosis  is present.   7. Pulmonic valve regurgitation is moderate.   8. The inferior vena cava is normal in size with greater than 50%  respiratory variability, suggesting right atrial pressure of 3 mmHg.    TTE 08/22/22  1. Left ventricular ejection fraction, by estimation, is 60 to 65%. The  left ventricle has normal function.  The left ventricle has no regional  wall motion abnormalities. There is mild left ventricular hypertrophy.  Left ventricular diastolic parameters  are consistent with Grade I diastolic dysfunction (impaired relaxation).  There is the interventricular septum is flattened in systole and diastole,  consistent with right ventricular pressure and volume overload.   2. Right ventricular systolic function demonstrates moderate hypokinesis  of the free wall with sparing of the apex consistent with acute cor  pulmonale (McConnell's sign). The right ventricular size is moderately  enlarged. Moderately increased right  ventricular wall thickness. There is severely elevated pulmonary artery  systolic pressure. The estimated right ventricular systolic pressure is  94.4 mmHg.   3. A small pericardial effusion is present.   4. The mitral valve is normal in structure. No evidence of mitral valve  regurgitation. No evidence of mitral stenosis.   5. Tricuspid valve regurgitation is moderate to severe.   6. The aortic valve is tricuspid. Aortic valve regurgitation is not  visualized. No aortic stenosis is present.   7. The inferior vena cava is normal in size with greater than 50%  respiratory variability, suggesting right atrial pressure of 3 mmHg.    Laboratory Data:  High Sensitivity Troponin:   Recent Labs  Lab 09/20/22 1808 09/25/22 1159 09/25/22 1620 10/13/22 2314 10/14/22 0056  TROPONINIHS 15 15 16  37* 36*     Chemistry Recent Labs  Lab 10/15/22 0256 10/16/22 0604 10/17/22 0449  NA 132* 136 137  K 3.7 3.6 3.7  CL 107 108 108  CO2 18* 21* 22  GLUCOSE 226* 141*  86  BUN 46* 48* 50*  CREATININE 1.32* 1.25* 1.19*  CALCIUM 7.4* 7.8* 7.9*  MG 2.2 2.0 1.7  GFRNONAA 59* >60 >60  ANIONGAP 7 7 7     Recent Labs  Lab 10/12/22 0847 10/13/22 2314  PROT 4.2* 4.7*  ALBUMIN <1.5* <1.5*  AST 18 18  ALT 11 12  ALKPHOS 113 115  BILITOT 0.4 0.6   Lipids No results for input(s): "CHOL", "TRIG", "HDL", "LABVLDL", "LDLCALC", "CHOLHDL" in the last 168 hours.  Hematology Recent Labs  Lab 10/15/22 0256 10/16/22 0604 10/17/22 0449  WBC 23.9* 20.8* 21.0*  RBC 4.12 4.11 4.19  HGB 11.1* 11.0* 11.3*  HCT 34.5* 34.7* 35.3*  MCV 83.7 84.4 84.2  MCH 26.9 26.8 27.0  MCHC 32.2 31.7 32.0  RDW 15.5 15.6* 15.4  PLT 321 335 354   Thyroid No results for input(s): "TSH", "FREET4" in the last 168 hours.  BNP Recent Labs  Lab 10/12/22 0847 10/13/22 2314  BNP 966.2* 1,182.8*    DDimer No results for input(s): "DDIMER" in the last 168 hours.   Radiology/Studies:  ECHOCARDIOGRAM COMPLETE  Result Date: 10/16/2022    ECHOCARDIOGRAM REPORT   Patient Name:   SAREE KROGH Date of Exam: 10/15/2022 Medical Rec #:  [id number]           Height:       59.0 in Accession #:    [id number]          Weight:       138.7 lb Date of Birth:  January 16, 2001           BSA:          1.579 m Patient Age:    21 years            BP:           94/75 mmHg Patient Gender: F  HR:           87 bpm. Exam Location:  [facility] Procedure: 2D Echo Indications:     CHF I50.31  History:         Patient has prior history of Echocardiogram examinations, most                  recent 08/22/2022.  Sonographer:     Overton Mam RDCS Referring Phys:  [id number] Gillis Santa Diagnosing Phys: Chilton Si MD IMPRESSIONS  1. Left ventricular ejection fraction, by estimation, is 60 to 65%. The left ventricle has normal function. The left ventricle has no regional wall motion abnormalities. There is mild concentric left ventricular hypertrophy. Left ventricular diastolic parameters were normal. There  is the interventricular septum is flattened in systole and diastole, consistent with right ventricular pressure and volume overload.  2. Right ventricular systolic function is mildly reduced. The right ventricular size is severely enlarged. There is severely elevated pulmonary artery systolic pressure.  3. A small pericardial effusion is present. The pericardial effusion is localized near the right ventricle and anterior to the right ventricle. There is no evidence of cardiac tamponade.  4. The mitral valve is normal in structure. No evidence of mitral valve regurgitation. No evidence of mitral stenosis.  5. Tricuspid valve regurgitation is severe.  6. The aortic valve is tricuspid. Aortic valve regurgitation is not visualized. No aortic stenosis is present.  7. Pulmonic valve regurgitation is moderate.  8. The inferior vena cava is normal in size with greater than 50% respiratory variability, suggesting right atrial pressure of 3 mmHg. FINDINGS  Left Ventricle: Left ventricular ejection fraction, by estimation, is 60 to 65%. The left ventricle has normal function. The left ventricle has no regional wall motion abnormalities. The left ventricular internal cavity size was normal in size. There is  mild concentric left ventricular hypertrophy. The interventricular septum is flattened in systole and diastole, consistent with right ventricular pressure and volume overload. Left ventricular diastolic parameters were normal. Indeterminate filling pressures. Right Ventricle: The right ventricular size is severely enlarged. No increase in right ventricular wall thickness. Right ventricular systolic function is mildly reduced. There is severely elevated pulmonary artery systolic pressure. The tricuspid regurgitant velocity is 5.12 m/s, and with an assumed right atrial pressure of 3 mmHg, the estimated right ventricular systolic pressure is 107.9 mmHg. Left Atrium: Left atrial size was normal in size. Right Atrium: Right  atrial size was normal in size. Pericardium: A small pericardial effusion is present. The pericardial effusion is localized near the right ventricle and anterior to the right ventricle. There is excessive respiratory variation in the tricuspid valve spectral Doppler velocities. There is no evidence of cardiac tamponade. Mitral Valve: The mitral valve is normal in structure. No evidence of mitral valve regurgitation. No evidence of mitral valve stenosis. Tricuspid Valve: The tricuspid valve is normal in structure. Tricuspid valve regurgitation is severe. No evidence of tricuspid stenosis. Aortic Valve: The aortic valve is tricuspid. Aortic valve regurgitation is not visualized. No aortic stenosis is present. Aortic valve peak gradient measures 5.0 mmHg. Pulmonic Valve: The pulmonic valve was normal in structure. Pulmonic valve regurgitation is moderate. No evidence of pulmonic stenosis. Aorta: The aortic root is normal in size and structure. Venous: The inferior vena cava is normal in size with greater than 50% respiratory variability, suggesting right atrial pressure of 3 mmHg. IAS/Shunts: No atrial level shunt detected by color flow Doppler.  LEFT VENTRICLE PLAX 2D LVIDd:  2.40 cm     Diastology LVIDs:         1.70 cm     LV e' medial:    8.92 cm/s LV PW:         1.30 cm     LV E/e' medial:  9.2 LV IVS:        1.10 cm     LV e' lateral:   10.80 cm/s LVOT diam:     1.70 cm     LV E/e' lateral: 7.6 LV SV:         25 LV SV Index:   16 LVOT Area:     2.27 cm  LV Volumes (MOD) LV vol d, MOD A4C: 18.0 ml LV vol s, MOD A4C: 7.3 ml LV SV MOD A4C:     18.0 ml RIGHT VENTRICLE RV Basal diam:  4.40 cm RV S prime:     11.20 cm/s TAPSE (M-mode): 1.9 cm LEFT ATRIUM           Index       RIGHT ATRIUM           Index LA diam:      2.70 cm 1.71 cm/m  RA Area:     12.60 cm LA Vol (A2C): 14.6 ml 9.25 ml/m  RA Volume:   28.30 ml  17.93 ml/m LA Vol (A4C): 6.9 ml  4.39 ml/m  AORTIC VALVE                 PULMONIC VALVE AV  Area (Vmax): 1.62 cm     PV Vmax:          0.85 m/s AV Vmax:        112.00 cm/s  PV Peak grad:     2.9 mmHg AV Peak Grad:   5.0 mmHg     PR End Diast Vel: 25.50 msec LVOT Vmax:      79.80 cm/s   RVOT Peak grad:   1 mmHg LVOT Vmean:     48.000 cm/s LVOT VTI:       0.111 m  AORTA Ao Root diam: 2.70 cm        PULMONARY ARTERY Ao Asc diam:  2.10 cm        MPA diam:        2.40 cm MITRAL VALVE               TRICUSPID VALVE MV Area (PHT): 4.39 cm    TR Peak grad:   104.9 mmHg MV Decel Time: 173 msec    TR Vmax:        512.00 cm/s MV E velocity: 81.80 cm/s MV A velocity: 77.60 cm/s  SHUNTS MV E/A ratio:  1.05        Systemic VTI:  0.11 m                            Systemic Diam: 1.70 cm Chilton Si MD Electronically signed by Chilton Si MD Signature Date/Time: 10/16/2022/6:06:16 AM    Final    CT Chest High Resolution  Result Date: 10/14/2022 CLINICAL DATA:  Hemoptysis, dizziness and nephrotic syndrome. Recent sub massive pulmonary embolus with right heart dysfunction. Nephrotic syndrome. EXAM: CT CHEST WITHOUT CONTRAST TECHNIQUE: Multidetector CT imaging of the chest was performed following the standard protocol without intravenous contrast. High resolution imaging of the lungs, as well as inspiratory and expiratory imaging, was performed. RADIATION DOSE REDUCTION: This exam was performed  according to the departmental dose-optimization program which includes automated exposure control, adjustment of the mA and/or kV according to patient size and/or use of iterative reconstruction technique. COMPARISON:  10/12/2022 and 09/25/2022 and 08/22/2022. FINDINGS: Cardiovascular: Mild cardiac enlargement. Small pericardial effusion appears similar to previous exam. Mediastinum/Nodes: Thyroid gland, trachea, and esophagus are unremarkable. No enlarged mediastinal or axillary lymph nodes. Hilar structures are suboptimally evaluated due to lack of IV contrast. Lungs/Pleura: Small right pleural effusion and trace left  pleural effusion are again noted. There is a large geographic area of progressive ground-glass attenuation involving the right upper lobe, image 35/2. A patchy area of ground-glass attenuation is noted within the medial left upper lobe which is also increased from the previous exam more generalized diffuse ground-glass attenuation is noted within the right middle lobe and both lower lobes. -Peripheral, nodular consolidation with central cavitation within the right upper lobe measures 2.1 x 1.1 cm, image 38/2. Formally 2.1 by 0.7 cm. -Within the posterolateral right lung base there is a subpleural cavitary process measuring 3.0 x 1.8 cm, image 43/2. Previously 2.8 x 1.7 cm. -Consolidation with central cavitation involving the right middle lobe is again identified. This measures 5.5 x 4.3 cm, image 64/2. On the previous exam this area measured 6.1 x 3.7 cm. -New rounded area of masslike consolidation within the central right middle lobe extending across the posterior major fissure into the superior segment of right lower lobe measures 2.3 x 2.8 cm, image 61/2. -Similar appearance of subpleural consolidation within the posteromedial right base, image 85/2. Subpleural cystic changes are identified overlying bilateral posterior lung bases and to a lesser extent the subpleural left apex, image 17/2. Upper Abdomen: No acute abnormality. Musculoskeletal: No chest wall mass or suspicious bone lesions identified. IMPRESSION: 1. Large geographic area of progressive ground-glass attenuation is identified within the right upper lobe. Smaller area of progressive subpleural ground-glass attenuation is noted within the left upper lobe. These areas may represent foci of alveolar hemorrhage, infection, or edema. 2. Multifocal areas of nodular and masslike architectural distortion with internal cavitation are again noted. The largest areas in the right middle lobe. As mention previously these may represent areas of pulmonary infarct  with internal necrosis. Differential considerations include septic emboli, atypical infection or vasculitis. 3. New area of nodular masslike architectural distortion within the right middle lobe and superior segment of the right lower lobe (centered around the major fissure). This may also represent an area of evolving pulmonary infarct, embolic phenomenon, infection or vasculitis. 4. Unchanged small right pleural effusion and trace left pleural effusion. 5. Subpleural cystic changes are again seen within both lung bases. Etiology is indeterminate. Cannot exclude underlying fibrotic interstitial lung disease. Electronically Signed   By: Signa Kell M.D.   On: 10/14/2022 11:05   CT HEAD WO CONTRAST ( )  Result Date: 10/14/2022 CLINICAL DATA:  Syncope/presyncope. EXAM: CT HEAD WITHOUT CONTRAST TECHNIQUE: Contiguous axial images were obtained from the base of the skull through the vertex without intravenous contrast. RADIATION DOSE REDUCTION: This exam was performed according to the departmental dose-optimization program which includes automated exposure control, adjustment of the mA and/or kV according to patient size and/or use of iterative reconstruction technique. COMPARISON:  None Available. FINDINGS: Brain: No evidence of acute infarction, hemorrhage, hydrocephalus, extra-axial collection or mass lesion/mass effect. Vascular: No hyperdense vessel or unexpected calcification. Skull: Normal. Negative for fracture or focal lesion. Sinuses/Orbits: No acute finding. IMPRESSION: Negative head CT. Electronically Signed   By: Audry Riles.D.  On: 10/14/2022 04:38   DG Chest Portable 1 View  Result Date: 10/14/2022 CLINICAL DATA:  worsening SOB EXAM: PORTABLE CHEST 1 VIEW COMPARISON:  Chest x-ray 10/13/2022 FINDINGS: The heart and mediastinal contours are unchanged. Interval worsening of diffuse patchy airspace opacities within the right lung. No pulmonary edema. No pleural effusion. No pneumothorax. No  acute osseous abnormality. IMPRESSION: Interval worsening of diffuse patchy airspace opacities within the right lung. Electronically Signed   By: Tish Frederickson M.D.   On: 10/14/2022 01:51   DG Chest Port 1 View  Result Date: 10/13/2022 CLINICAL DATA:  Pain. EXAM: PORTABLE CHEST 1 VIEW COMPARISON:  Radiograph and CT yesterday FINDINGS: Similar right pleural effusion, the left pleural effusion on CT is not well-defined by radiograph. Ill-defined airspace disease throughout the right lung, similar in radiographic appearance. The cavitary components on CT are not well-defined by radiograph. Stable heart size and mediastinal contours. No pneumothorax. Remote right clavicle fracture. IMPRESSION: Unchanged appearance of the chest with right pleural effusion and ill-defined airspace disease throughout the right lung. Cavitary components on CT are not well-defined by radiograph. Electronically Signed   By: Narda Rutherford M.D.   On: 10/13/2022 23:17     Assessment and Plan:   Sepsis due to multifocal pneumonia -Previous CT revealed cavitary lesion of the right upper lobes greater than the left, QuantiFERON gold performed 07/05/2022 that resulted as negative -Repeat CT on 4/13 revealed bilateral pneumonia, right middle lobe masslike lesion could be septic emboli, atypical infection or vasculitis with small bilateral pleural effusion -Continues to be followed by pulmonary -Previously required Levophed which was weaned off -Received 2 L of normal saline and LR with broad-spectrum antibiotics cefepime/vancomycin/azithromycin -ID was consulted.  Recommended lung biopsy -Patient was seen by pulmonary this morning and has concerns about patient being too high risk for procedure -Continued on antibiotic therapy -Management per IM  Pulmonary embolism with hemoptysis -Status post thrombolysis and thrombectomy 08/22/22 -Recent CT on 10/12/2022 demonstrated near occlusive pulmonary embolism of the right intralobular  artery and occlusive thrombus of the right middle lobe lobular artery -on Heparin drip while in th hospital, previously on Warfarin and lovenox -On initial diagnosis placed was discharged from the facility on apixaban once that she had completed a prescription of apixaban she had to get any refills and was left unprotected without anticoagulation for a undetermined period of time. -hgb 11.3 -daily cbc  AKI on CKD with proteinuria and history of nephrotic syndrome with minimal-change disease -baseline serum creatine 1.08 -serum creatinine today 1.19 -followed by Nephrology -monitor urine output, -984 output in the last 24 hours -Tacrolimus remains on hold due to possible infection -continued on solumedrol therapy -daily bmp -monitor/trend/replete electrolytes -currently on furosemide 20 mg IVP daily  Poorly controlled type 1 diabetes -poorly controlled with chronic kidney disease -A1c 9.1 08/25/22 -management per IM  Pulmonary hypertension/severe TR/pericardial effusion noted on echocardiogram -Eventually will need a RHC -Recommend VQ scan for evaluation CTEPH -Small pericardial effusion was noted on echocardiogram without tamponade -Continued on furosemide 20 mg IV daily -avoid over dieresis  -Can consider repeat limited echo closer to discharge to reevaluate effusion -Patient requested transfer to [facility] to have continued workup completed there as her other providers are there  Hypertension with previous hypotension -Blood pressure remains soft -Blood pressure 98/66 -Not currently on antihypertensive medication -Levophed drip has been weaned off -Vital signs per unit protocol  Hyperlipidemia -Previously had been taking atorvastatin but is no longer on any medications -Direct LDL 171 12/13/2021  Risk Assessment/Risk Scores:        New York Heart Association (NYHA) Functional Class NYHA Class III        For questions or updates, please contact [facility] Please consult [email or web address] for contact info under    Signed, Jaskarn Schweer, NP  10/17/2022 7:37 AM

## 2022-10-18 ENCOUNTER — Inpatient Hospital Stay: Payer: Medicaid Other

## 2022-10-18 DIAGNOSIS — A419 Sepsis, unspecified organism: Secondary | ICD-10-CM | POA: Diagnosis not present

## 2022-10-18 DIAGNOSIS — I2699 Other pulmonary embolism without acute cor pulmonale: Secondary | ICD-10-CM | POA: Diagnosis not present

## 2022-10-18 DIAGNOSIS — I959 Hypotension, unspecified: Secondary | ICD-10-CM | POA: Diagnosis not present

## 2022-10-18 DIAGNOSIS — I2724 Chronic thromboembolic pulmonary hypertension: Secondary | ICD-10-CM | POA: Diagnosis not present

## 2022-10-18 LAB — GLUCOSE, CAPILLARY
Glucose-Capillary: 326 mg/dL — ABNORMAL HIGH (ref 70–99)
Glucose-Capillary: 266 mg/dL — ABNORMAL HIGH (ref 70–99)
Glucose-Capillary: 254 mg/dL — ABNORMAL HIGH (ref 70–99)
Glucose-Capillary: 348 mg/dL — ABNORMAL HIGH (ref 70–99)

## 2022-10-18 LAB — MAGNESIUM: Magnesium: 1.8 mg/dL (ref 1.7–2.4)

## 2022-10-18 LAB — BASIC METABOLIC PANEL
Anion gap: 5 (ref 5–15)
BUN: 47 mg/dL — ABNORMAL HIGH (ref 6–20)
CO2: 20 mmol/L — ABNORMAL LOW (ref 22–32)
Calcium: 7.8 mg/dL — ABNORMAL LOW (ref 8.9–10.3)
Chloride: 108 mmol/L (ref 98–111)
Creatinine, Ser: 1.35 mg/dL — ABNORMAL HIGH (ref 0.44–1.00)
GFR, Estimated: 57 mL/min — ABNORMAL LOW (ref >60)
Glucose, Bld: 433 mg/dL — ABNORMAL HIGH (ref 70–99)
Potassium: 4.4 mmol/L (ref 3.5–5.1)
Sodium: 133 mmol/L — ABNORMAL LOW (ref 135–145)

## 2022-10-18 LAB — TOXOPLASMA ANTIBODIES- IGG AND  IGM
Toxoplasma Antibody- IgM: <3 AU/mL (ref 0.0–7.9)
Toxoplasma IgG Ratio: <3 IU/mL (ref 0.0–7.1)

## 2022-10-18 LAB — CBC
HCT: 34.3 % — ABNORMAL LOW (ref 36.0–46.0)
Hemoglobin: 10.8 g/dL — ABNORMAL LOW (ref 12.0–15.0)
MCH: 27 pg (ref 26.0–34.0)
MCHC: 31.5 g/dL (ref 30.0–36.0)
MCV: 85.8 fL (ref 80.0–100.0)
Platelets: 342 10*3/uL (ref 150–400)
RBC: 4 MIL/uL (ref 3.87–5.11)
RDW: 15.7 % — ABNORMAL HIGH (ref 11.5–15.5)
WBC: 18.5 10*3/uL — ABNORMAL HIGH (ref 4.0–10.5)
nRBC: 0 % (ref 0.0–0.2)

## 2022-10-18 LAB — PHOSPHORUS: Phosphorus: 3 mg/dL (ref 2.5–4.6)

## 2022-10-18 LAB — ANGIOTENSIN CONVERTING ENZYME: Angiotensin-Converting Enzyme: 64 U/L (ref 14–82)

## 2022-10-18 LAB — SEDIMENTATION RATE: Sed Rate: 42 mm/hr — ABNORMAL HIGH (ref 0–20)

## 2022-10-18 LAB — C-REACTIVE PROTEIN: CRP: <0.5 mg/dL (ref <1.0)

## 2022-10-18 LAB — HEPARIN LEVEL (UNFRACTIONATED): Heparin Unfractionated: 0.47 IU/mL (ref 0.30–0.70)

## 2022-10-18 LAB — CULTURE, BLOOD (ROUTINE X 2): Special Requests: ADEQUATE

## 2022-10-18 MED ORDER — HEPARIN (PORCINE) 25000 UT/250ML-% IV SOLN: 1150.0000 [IU]/h | INTRAVENOUS | Status: DC

## 2022-10-18 MED ORDER — FUROSEMIDE 10 MG/ML IJ SOLN
40.0000 mg | Freq: Once | INTRAMUSCULAR | Status: DC
  Filled 2022-10-18: qty 4

## 2022-10-18 MED ORDER — INSULIN GLARGINE-YFGN 100 UNIT/ML ~~LOC~~ SOLN
28.0000 [IU] | Freq: Every day | SUBCUTANEOUS | Status: DC
  Administered 2022-10-18: 28 [IU] via SUBCUTANEOUS
  Filled 2022-10-18: qty 0.28

## 2022-10-18 MED ORDER — SODIUM CHLORIDE 0.9% FLUSH
3.0000 mL | Freq: Two times a day (BID) | INTRAVENOUS | Status: DC
  Administered 2022-10-18: 3 mL via INTRAVENOUS

## 2022-10-18 NOTE — Plan of Care (Signed)
As per Dr. Lucianne Muss to assist with plan of care.  I have reviewed the consult note by Dr. Raechel Chute yesterday.  I concur with Dr. Doreene Adas assessment as delineated in his very detailed consultation note.  The patient is high risk for bronchoscopy at this point and the risk/benefit ratio is skewed towards more risk of exsanguination than benefit of any potential diagnosis.  Have discussed the case with Dr. Rivka Safer as well and the likelihood of the findings on CT being infectious is low not withstanding the patient's immunocompromised status.  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  The patient first and foremost needs management in a center that can handle CTEPH issues.  From the pulmonary standpoint she is now stable for transfer to such facility.  It appears that cardiology would do a right heart cath in the morning if the patient is still in-house.  This could be done without stopping anticoagulation.  I have ordered additional studies to hopefully shed light on her current findings.  Gailen Shelter, MD Advanced Bronchoscopy PCCM [facility]    *This note was dictated using voice recognition software/Dragon.  Despite best efforts to proofread, errors can occur which can change the meaning. Any transcriptional errors that result from this process are unintentional and may not be fully corrected at the time of dictation.

## 2022-10-18 NOTE — Progress Notes (Signed)
Inpatient Diabetes Program Recommendations  AACE/ADA: New Consensus Statement on Inpatient Glycemic Control (2015)  Target Ranges:  Prepandial:   less than 140 mg/dL      Peak postprandial:   less than 180 mg/dL (1-2 hours)      Critically ill patients:  140 - 180 mg/dL   Lab Results  Component Value Date   GLUCAP 266 (H) 10/18/2022   HGBA1C 9.1 (H) 08/25/2022    Review of Glycemic Control  Latest Reference Range & Units 10/17/22 20:51 10/18/22 08:15 10/18/22 12:00  Glucose-Capillary 70 - 99 mg/dL 161 (H) 096 (H) 045 (H)   Diabetes history: Type 1 DM  Outpatient Diabetes medications:  Lantus 30 units q HS, Novolog 5-8 units tid with meals  ** Novolog 1 units for every 8 carbs, Novolog 1 units for every 50 mg/dL >409 mg/dL [facility] endocrinology Current orders for Inpatient glycemic control:  Novolog sensitive tid with meals and HS Semglee 20 units q HS Novolog 5 units tid with meals  Prednisone 30 mg q AM (3 doses for 3 days- then further taper) Inpatient Diabetes Program Recommendations:    Please consider increasing Semglee to 28 units q HS.  Thanks,  Beryl Meager, RN, BC-ADM Inpatient Diabetes Coordinator Pager [phone]  (8a-5p)

## 2022-10-18 NOTE — Progress Notes (Deleted)
New patient visit   Patient: Catherine [name]   DOB: [birth year]   21 y.o. Female  MRN: [id number] Visit Date: 10/19/2022  Today's healthcare provider: Ronnald Ramp, MD   No chief complaint on file.  Subjective    Catherine [name] is a 22 y.o. female who presents today as a new patient to establish care.  HPI   Encounter to Establish Care Patient presents to establish care  Introduced myself and my role as primary care physician  We reviewed patient's medical, surgical, and social history and medications as listed below    PMHX   Type 1 DM Medications: Lantus 15units before bed, Novolog 5-8 units TID before meals    Chronic thromboembolic Pulmonary HTN  PE Medications: warfarin 5mg   Follows with heme/onc Dr. Janyth Contes     Minimal Change Disease  Nephrotic Syndrome 2/2 to T1DM Medications: prednisone 50mg , tacrolimus, bactrim and torsemide 20mg   Follows with nephrology, Dr. 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  Tobacco Use   Smoking status: Former    Types: E-cigarettes    Quit date: 07/2022    Years since quitting: 0.2   Smokeless tobacco: Never   Tobacco comments:    Quit substance use since the beginning of the year   Vaping Use   Vaping Use: Former  Substance and Sexual Activity   Alcohol use: Not Currently   Drug use: Yes    Types: Marijuana    Comment: Quit using marijuana approx earlier this year   Sexual activity: Not Currently  Other Topics Concern   Not on file  Social History Narrative   Not on file   Social Determinants of Health   Financial Resource Strain: Not on file  Food Insecurity: No Food Insecurity (10/14/2022)   Hunger Vital Sign    Worried About Running Out of Food in the Last Year: Never true    Ran Out of Food in the Last Year: Never true  Transportation Needs: No Transportation Needs (10/14/2022)   PRAPARE - Administrator, Civil Service (Medical): No    Lack of Transportation (Non-Medical): No  Physical Activity: Not on file  Stress: Not on file  Social Connections: Not on file   Facility-Administered Medications Prior to Visit  Medication Dose Route Frequency Provider   0.9 %  sodium chloride infusion  250 mL Intravenous Continuous Ouma,  Hubbard Hartshorn, NP   acetaminophen (TYLENOL) tablet 650 mg  650 mg Oral Q4H PRN Erin Fulling, MD   benzonatate (TESSALON) capsule 100 mg  100 mg Oral TID PRN Gillis Santa, MD   Chlorhexidine Gluconate Cloth 2 % PADS 6 each  6 each Topical Daily Erin Fulling, MD   chlorpheniramine-HYDROcodone (TUSSIONEX) 10-8 MG/5ML suspension 5 mL  5 mL Oral Q12H PRN Gillis Santa, MD   docusate sodium (COLACE) capsule 100 mg  100 mg Oral BID PRN Jimmye Norman, NP   furosemide (LASIX) injection 40 mg  40 mg Intravenous Once Laurey Morale, MD   guaiFENesin (MUCINEX) 12 hr tablet 600 mg  600 mg Oral BID Gillis Santa, MD   heparin ADULT infusion 100 units/mL (25000 units/24mL)   1,150 Units/hr Intravenous Continuous Erin Fulling, MD   ibuprofen (ADVIL) tablet 800 mg  800 mg Oral Q6H PRN Erin Fulling, MD   insulin aspart (novoLOG) injection 0-5 Units  0-5 Units Subcutaneous QHS Gillis Santa, MD   insulin aspart (novoLOG) injection 0-9 Units  0-9 Units Subcutaneous TID WC Gillis Santa, MD   insulin aspart (novoLOG) injection 5 Units  5 Units Subcutaneous TID WC Gillis Santa, MD   insulin glargine-yfgn (SEMGLEE) injection 28 Units  28 Units Subcutaneous QHS Gillis Santa, MD   ipratropium-albuterol (DUONEB) 0.5-2.5 (3) MG/3ML nebulizer solution 3 mL  3 mL Nebulization Q6H PRN Jimmye Norman, NP   menthol-cetylpyridinium (CEPACOL) lozenge 3 mg  1 lozenge Oral PRN Gillis Santa, MD   ondansetron [facility]) injection 4 mg  4 mg Intravenous Q6H PRN Jimmye Norman, NP   Oral care mouth rinse  15 mL Mouth Rinse PRN Gillis Santa, MD   polyethylene glycol (MIRALAX / GLYCOLAX) packet 17 g  17 g Oral Daily PRN Jimmye Norman, NP   predniSONE (DELTASONE) tablet 30 mg  30 mg Oral Q breakfast Gillis Santa, MD   Followed by   Melene Muller ON 10/21/2022] predniSONE (DELTASONE) tablet 20 mg  20 mg Oral Q breakfast Gillis Santa, MD   Followed by   Melene Muller ON 10/24/2022] predniSONE (DELTASONE) tablet 10 mg  10 mg Oral Q breakfast Gillis Santa, MD   sodium chloride flush (NS) 0.9 % injection 3 mL  3 mL Intravenous Q12H Laurey Morale, MD   Vitamin D (Ergocalciferol) (DRISDOL) 1.25 MG (50000 UNIT) capsule 50,000 Units  50,000 Units Oral Q7 days Gillis Santa, MD   Outpatient Medications Prior to Visit  Medication Sig   acetaminophen (TYLENOL) 325 MG tablet Take 2 tablets (650 mg total) by mouth every 6 (six) hours as needed for mild pain or headache.   benzonatate (TESSALON PERLES) 100 MG capsule Take 1 capsule (100 mg total) by mouth 3 (three) times daily as needed for cough.   blood glucose meter kit and supplies Dispense based on patient and insurance preference.  Use up to four times daily as directed. (FOR ICD-10 E10.9, E11.9).   calcitRIOL (ROCALTROL) 0.25 MCG capsule Take 0.25 mcg by mouth as directed. Take 1 capsule (0.25 mcg) three times a week.   chlorpheniramine-HYDROcodone (TUSSIONEX) 10-8 MG/5ML Take 5 mLs by mouth every 12 (twelve) hours as needed for cough.   enoxaparin (LOVENOX) 60 MG/0.6ML injection Inject 0.575 mLs (57.5 mg total) into the skin every 12 (twelve) hours. (Patient not taking: Reported on 10/14/2022)   feeding supplement, GLUCERNA SHAKE, (GLUCERNA SHAKE) LIQD Take 237 mLs by mouth 2 (two) times daily between meals.   glucagon 1 MG injection Inject 1 mg into  the muscle as needed.   insulin aspart (NOVOLOG) 100 UNIT/ML injection Inject 5-8 Units into the skin 3 (three) times daily before meals.   ketorolac (TORADOL) 10 MG tablet Take 1 tablet (10 mg total) by mouth every 6 (six) hours as needed.   LANTUS 100 UNIT/ML injection Inject 0.15 mLs (15 Units total) into the skin at bedtime.   Multiple Vitamin (MULTIVITAMIN WITH MINERALS) TABS tablet Take 1 tablet by mouth daily.   sulfamethoxazole-trimethoprim (BACTRIM DS) 800-160 MG tablet Take 1 tablet by mouth every Monday, Wednesday, and Friday. (Patient not taking: Reported on 10/14/2022)   tacrolimus (PROGRAF) 1 MG capsule Take 2 capsules (2 mg total) by mouth 2 (two) times daily.   torsemide (DEMADEX) 20 MG tablet Take 1 tablet (20 mg total) by mouth daily.   torsemide (DEMADEX) 20 MG tablet Take 1 tablet (20 mg total) by mouth daily.   Vitamin D, Ergocalciferol, (DRISDOL) 1.25 MG (50000 UNIT) CAPS capsule Take 50,000 Units by mouth every 7 (seven) days.   warfarin (COUMADIN) 5 MG tablet Take 1 tablet (5 mg total) by mouth daily.   No Known Allergies  Immunization History  Administered Date(s) Administered   HPV 9-valent 10/07/2015   HPV Quadrivalent 04/12/2017   Hepatitis B, Dialysis 11/11/2019   Influenza, Seasonal, Injecte, Preservative Fre 04/18/2007, 05/12/2008    Influenza,inj,Quad PF,6+ Mos 05/13/2012, 04/07/2013, 06/11/2014, 05/10/2015, 04/12/2017   Influenza,inj,quad, With Preservative 04/17/2016   Influenza-Unspecified 05/01/2016   Pneumococcal Conjugate-13 01/14/2017   Pneumococcal Polysaccharide-23 11/11/2019    Health Maintenance  Topic Date Due   COVID-19 Vaccine (1) Never done   FOOT EXAM  Never done   OPHTHALMOLOGY EXAM  Never done   HPV VACCINES (3 - Risk 3-dose series) 08/13/2017   DTaP/Tdap/Td (1 - Tdap) Never done   PAP-Cervical Cytology Screening  Never done   PAP SMEAR-Modifier  Never done   INFLUENZA VACCINE  02/01/2023   HEMOGLOBIN A1C  02/23/2023   Diabetic kidney evaluation - Urine ACR  09/04/2023   Diabetic kidney evaluation - eGFR measurement  10/18/2023   Hepatitis C Screening  Completed   HIV Screening  Completed    Patient Care Team: Center, [facility] as PCP - General  Review of Systems  {Labs  Heme  Chem  Endocrine  Serology  Results Review (optional):23779}   Objective    LMP  (LMP Unknown)  {Show previous vital signs (optional):23777}  Physical Exam ***  Depression Screen    09/08/2022   11:05 AM  PHQ 2/9 Scores  Exception Documentation Patient refusal   No results found for any visits on 10/19/22.  Assessment & Plan     ***  No follow-ups on file.     {provider attestation***:1}   Ronnald Ramp, MD  [facility] [phone] (phone) [phone] (fax)  [facility]

## 2022-10-18 NOTE — Consult Note (Signed)
Advanced Heart Failure Team Consult Note   Primary Physician: [facility] PCP-Cardiologist:  None  Reason for Consultation: Dr. Okey Dupre  HPI:    Catherine [name] is seen today for evaluation of pulmonary hypertension/RV failure at the request of Dr. Okey Dupre.   22 y.o. with history of type 1 diabetes, minimal change disease with nephrotic syndrome and CKD stage 3a, and submassive PE in 2/24 and subsequent changes suggestive of chronic thromboembolic disease was admitted with dyspnea, hempotysis, cough, fever.    Patient is followed by nephrology at [facility] for minimal change disease.  She has nephrotic syndrome and has been on prednisone and tacrolimus.    In 12/23, she was admitted at [facility] with AKI and noted to have marked hypoalbuminemia and proteinuria. She was started on Eliquis as prophylaxis for venous thromboembolism in setting of nephrotic syndrome.  However, she ran out at home.  In 2/24, she was admitted at [facility] with acute submassive PE with concern for pulmonary infarcts.  She underwent thrombolysis and mechanical thrombectomy.  She was again started on Eliquis.  She returned to [facility] later in 2/24 with dyspnea, RLL infiltrates on CT with cavitary lesions thought to be due to pulmonary infarcts.  She was transitioned to Lovenox/warfarin. On 10/12/22, she was een in the ER with edema and dyspnea and sent home.  On 10/12/22, she was back to the ER with fever to 100, cough, hemoptysis, and chest pain along with dyspnea.  BP was low and she was transiently on norepinephrine (now off).  CXR showed diffuse patchy multifocal airspace disease.  She was initially given IV fluid and broad spectrum antibiotics.    Most recent CTA chest was on 10/12/22, showing near occlusive PE right interlobar artery, totally occluded RML artery.  HRCT chest on 10/14/22 showed LUL, RUL ground glass, areas of cavitation in the RML and RLL consistent with pulmonary infarction.  Echo in 4/24 showed EF  60-65%, mild LVH, D-shaped interventricular septum, severely enlarged RV with moderate RV dysfunction, PASP 108 mmHg, IVC not significantly dilated.   Patient has been seen by ID and pulmonary.  Suspicion is that the nodular and cavitary lesions are likely from pulmonary infarctions rather than infectious in etiology.  Antibiotics have been stopped. She is immunocompromised with tacrolimus and steroids at home, so there is concern for fungal/nocardial/viral/PJP superinfection. ID have recommended bronchoscopy with BAL.   Review of Systems: All systems reviewed and negative except as per HPI.   Home Medications Prior to Admission medications   Medication Sig Start Date End Date Taking? Authorizing Provider  acetaminophen (TYLENOL) 325 MG tablet Take 2 tablets (650 mg total) by mouth every 6 (six) hours as needed for mild pain or headache. 09/05/22  Yes Djan, Scarlette Calico, MD  benzonatate (TESSALON PERLES) 100 MG capsule Take 1 capsule (100 mg total) by mouth 3 (three) times daily as needed for cough. 08/30/22  Yes Loleta Rose, MD  calcitRIOL (ROCALTROL) 0.25 MCG capsule Take 0.25 mcg by mouth as directed. Take 1 capsule (0.25 mcg) three times a week. 09/20/22  Yes [provider]  chlorpheniramine-HYDROcodone (TUSSIONEX) 10-8 MG/5ML Take 5 mLs by mouth every 12 (twelve) hours as needed for cough. 08/30/22  Yes Loleta Rose, MD  glucagon 1 MG injection Inject 1 mg into the muscle as needed. 02/26/19  Yes [provider]  insulin aspart (NOVOLOG) 100 UNIT/ML injection Inject 5-8 Units into the skin 3 (three) times daily before meals. 09/06/21 10/14/22 Yes Darlin Priestly, MD  ketorolac (TORADOL) 10 MG tablet Take 1 tablet (10 mg total) by mouth every 6 (six) hours as needed. 09/25/22  Yes Merwyn Katos, MD  LANTUS 100 UNIT/ML injection Inject 0.15 mLs (15 Units total) into the skin at bedtime. 09/05/22 12/04/22 Yes DjanScarlette Calico, MD  Multiple Vitamin (MULTIVITAMIN WITH MINERALS) TABS tablet Take 1  tablet by mouth daily. 08/25/22  Yes Darlin Priestly, MD  tacrolimus (PROGRAF) 1 MG capsule Take 2 capsules (2 mg total) by mouth 2 (two) times daily. 08/25/22 11/23/22 Yes Darlin Priestly, MD  torsemide (DEMADEX) 20 MG tablet Take 1 tablet (20 mg total) by mouth daily. 10/12/22 11/11/22 Yes Georga Hacking, MD  Vitamin D, Ergocalciferol, (DRISDOL) 1.25 MG (50000 UNIT) CAPS capsule Take 50,000 Units by mouth every 7 (seven) days. 07/10/22 07/10/23 Yes [provider]  warfarin (COUMADIN) 5 MG tablet Take 1 tablet (5 mg total) by mouth daily. 10/05/22  Yes Rickard Patience, MD  blood glucose meter kit and supplies Dispense based on patient and insurance preference. Use up to four times daily as directed. (FOR ICD-10 E10.9, E11.9). 09/06/21   Darlin Priestly, MD  enoxaparin (LOVENOX) 60 MG/0.6ML injection Inject 0.575 mLs (57.5 mg total) into the skin every 12 (twelve) hours. Patient not taking: Reported on 10/14/2022 09/18/22   Rickard Patience, MD  feeding supplement, GLUCERNA SHAKE, (GLUCERNA SHAKE) LIQD Take 237 mLs by mouth 2 (two) times daily between meals. 08/25/22   Darlin Priestly, MD  sulfamethoxazole-trimethoprim (BACTRIM DS) 800-160 MG tablet Take 1 tablet by mouth every Monday, Wednesday, and Friday. Patient not taking: Reported on 10/14/2022 08/25/22 11/23/22  Darlin Priestly, MD  torsemide (DEMADEX) 20 MG tablet Take 1 tablet (20 mg total) by mouth daily. 08/25/22 09/24/22  Darlin Priestly, MD    Past Medical History: 1. Type 1 diabetes 2. Minimal change disease: Complicated by nephrotic syndrome.  Has been on tacrolimus and prednisone at home. CKD stage 3a at baseline.  3. H/o PE, chronic thromboembolic disease: 2/24 submassive PE with pulmonary infarctions treated with thrombolysis and mechanical thrombectomy.  Since that time, appears to have developed chronic thromboembolic disease.  - Echo (4/24): EF 60-65%, mild LVH, D-shaped interventricular septum, severely enlarged RV with moderate RV dysfunction, PASP 108 mmHg, IVC not significantly  dilated.  - CTA chest (4/24): Near occlusive PE right interlobar artery, totally occluded RML artery.  - HRCT chest (4/24): LUL, RUL ground glass, areas of cavitation in the RML and RLL consistent with pulmonary infarction.   Past Surgical History: Past Surgical History:  Procedure Laterality Date   PULMONARY THROMBECTOMY Bilateral 08/22/2022   Procedure: PULMONARY THROMBECTOMY;  Surgeon: Annice Needy, MD;  Location: [facility] INVASIVE CV LAB;  Service: Cardiovascular;  Laterality: Bilateral;    Family History: History reviewed. No pertinent family history.  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  Tobacco Use   Smoking status: Former    Types: E-cigarettes    Quit date: 07/2022    Years since quitting: 0.2   Smokeless tobacco: Never   Tobacco comments:    Quit substance use since the beginning of the year   Vaping Use   Vaping Use: Former  Substance and Sexual Activity   Alcohol use: Not Currently  Drug use: Yes    Types: Marijuana    Comment: Quit using marijuana approx earlier this year   Sexual activity: Not Currently  Other Topics Concern   Not on file  Social History Narrative   Not on file   Social Determinants of Health   Financial Resource Strain: Not on file  Food Insecurity: No Food Insecurity (10/14/2022)   Hunger Vital Sign    Worried About Running Out of Food in the Last Year: Never true    Ran Out of Food in the Last Year: Never true  Transportation Needs: No Transportation Needs (10/14/2022)   PRAPARE - Administrator, Civil Service (Medical): No    Lack of Transportation (Non-Medical): No  Physical Activity: Not on file  Stress: Not on file  Social Connections: Not on file    Allergies:  No Known  Allergies  Objective:    Vital Signs:   Temp:  [97.6 F (36.4 C)-98.2 F (36.8 C)] 97.7 F (36.5 C) (04/17 0752) Pulse Rate:  [81-126] 81 (04/17 0752) Resp:  [16-31] 16 (04/17 0752) BP: (90-107)/(60-80) 107/73 (04/17 0752) SpO2:  [92 %-100 %] 99 % (04/17 0752) Weight:  [62.6 kg] 62.6 kg (04/17 0735) Last BM Date : 10/17/22  Weight change: Filed Weights   10/16/22 0500 10/17/22 0441 10/18/22 0735  Weight: 64.9 kg 63.1 kg 62.6 kg    Intake/Output:   Intake/Output Summary (Last 24 hours) at 10/18/2022 1010 Last data filed at 10/18/2022 0600 Gross per 24 hour  Intake 267.47 ml  Output --  Net 267.47 ml      Physical Exam    General:  Well appearing. No resp difficulty HEENT: normal Neck: supple. JVP 10-12 cm. Carotids 2+ bilat; no bruits. No lymphadenopathy or thyromegaly appreciated. Cor: PMI nondisplaced. Regular rate & rhythm with loud P2. No rubs, gallops.  1/6 HSM LLSB.  Lungs: clear Abdomen: soft, nontender, nondistended. No hepatosplenomegaly. No bruits or masses. Good bowel sounds. Extremities: no cyanosis, clubbing, rash. 1+ edema 1/2 to knees bilaterally.  Neuro: alert & orientedx3, cranial nerves grossly intact. moves all 4 extremities w/o difficulty. Affect pleasant   Telemetry   NSR (personally reviewed)  EKG    NSR, right superior axis, RVH (personally reviewed)  Labs   Basic Metabolic Panel: Recent Labs  Lab 10/14/22 0320 10/15/22 0256 10/16/22 0604 10/17/22 0449 10/18/22 0547  NA 132* 132* 136 137 133*  K 4.0 3.7 3.6 3.7 4.4  CL 103 107 108 108 108  CO2 19* 18* 21* 22 20*  GLUCOSE 229* 226* 141* 86 433*  BUN 30* 46* 48* 50* 47*  CREATININE 1.22* 1.32* 1.25* 1.19* 1.35*  CALCIUM 6.8* 7.4* 7.8* 7.9* 7.8*  MG 1.4* 2.2 2.0 1.7 1.8  PHOS 4.0 4.2 2.9 3.3 3.0    Liver Function Tests: Recent Labs  Lab 10/12/22 0847 10/13/22 2314  AST 18 18  ALT 11 12  ALKPHOS 113 115  BILITOT 0.4 0.6  PROT 4.2* 4.7*  ALBUMIN <1.5* <1.5*   Recent  Labs  Lab 10/13/22 2314  LIPASE 27   No results for input(s): "AMMONIA" in the last 168 hours.  CBC: Recent Labs  Lab 10/13/22 2314 10/14/22 0457 10/15/22 0256 10/16/22 0604 10/17/22 0449 10/18/22 0547  WBC 19.0* 18.4* 23.9* 20.8* 21.0* 18.5*  NEUTROABS 12.8*  --   --   --   --   --   HGB 13.0 12.6 11.1* 11.0* 11.3* 10.8*  HCT 40.6 40.3 34.5* 34.7* 35.3* 34.3*  MCV 84.4 85.2 83.7 84.4 84.2 85.8  PLT 349 330 321 335 354 342    Cardiac Enzymes: No results for input(s): "CKTOTAL", "CKMB", "CKMBINDEX", "TROPONINI" in the last 168 hours.  BNP: BNP (last 3 results) Recent Labs    09/25/22 1158 10/12/22 0847 10/13/22 2314  BNP 645.3* 966.2* 1,182.8*    ProBNP (last 3 results) No results for input(s): "PROBNP" in the last 8760 hours.   CBG: Recent Labs  Lab 10/17/22 0817 10/17/22 1106 10/17/22 1553 10/17/22 2051 10/18/22 0815  GLUCAP 132* 196* 337* 363* 326*    Coagulation Studies: No results for input(s): "LABPROT", "INR" in the last 72 hours.   Imaging   No results found.   Medications:     Current Medications:  Chlorhexidine Gluconate Cloth  6 each Topical Daily   furosemide  40 mg Intravenous Once   guaiFENesin  600 mg Oral BID   insulin aspart  0-5 Units Subcutaneous QHS   insulin aspart  0-9 Units Subcutaneous TID WC   insulin aspart  5 Units Subcutaneous TID WC   insulin glargine-yfgn  28 Units Subcutaneous QHS   predniSONE  30 mg Oral Q breakfast   Followed by   Melene Muller ON 10/21/2022] predniSONE  20 mg Oral Q breakfast   Followed by   Melene Muller ON 10/24/2022] predniSONE  10 mg Oral Q breakfast   Vitamin D (Ergocalciferol)  50,000 Units Oral Q7 days    Infusions:  sodium chloride Stopped (10/17/22 1018)   heparin 1,150 Units/hr (10/18/22 0220)      Assessment/Plan   1. Chronic thromboembolic pulmonary disease:  Patient had submassive PE in 2/24 treated with thrombolysis and mechanical thrombectomy.  Trigger was likely nephrotic  syndrome with hypoalbuminemia (due to minimal change disease).  She had been off her prophylactic Eliquis at the time.  She now has CTA on 10/12/22 showing a near occlusive PE right interlobar artery, totally occluded RML artery.  High resolution CT chest shows changes of chronic thromboembolic disease with ground glass in the upper lobes and cavitary lesions likely due to pulmonary infarction. Echo shows severe pulmonary hypertension with RV failure; EF 60-65%, mild LVH, D-shaped interventricular septum, severely enlarged RV with moderate RV dysfunction, PASP 108 mmHg, IVC not significantly dilated. 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 Her pulmonary hypertension/RV dysfunction is quite significant, and I think this needs to be addressed in a center that specializes in treatment of CTEPH.  I know that [facility] has this capacity, not sure about [facility]. Would arrange transfer. She does have JVD today suggesting a degree of volume overload.  - Continue heparin gtt.  - If she remains at [facility], I will set up RHC tomorrow to formally assess filling pressures and PA pressure. Will do through a brachial vein without stopping anticoagulation.  We do not have the capacity in the [facility] ICU yet to manage Ernestine Conrad that this left in place. We discussed risks/benefits of RHC and she agrees to procedure if not transferred prior.  - With severe PAH, which I expect based on echo, would be concerned about treatment with systemic pulmonary vasodilators (such as riociguat which is often used as temporizing treatment in CTEPH) given extensive pulmonary infarction/cavitation which could lead to significant V/Q mismatch and drop in oxygen saturation. Inhaled NO would be an option for acute management, but needs transfer to higher acuity center to get this.  - She had Lasix 20 mg IV this morning, will give another 40 mg IV this afternoon and reassess based  on RHC tomorrow.  2. AKI on CKD stage 3a: Patient has underlying  minimal change disease with nephrotic syndrome.  Creatinine 1.19 => 1.35.   - Carefully diurese, will give another dose IV Lasix this evening and repeat BMET in am.  3. Minimal change disease: She was on tacrolimus and steroids at home.  Tacrolimus held with ?infection.  - Continue steroids, per nephrology.  4. ID: Patient has been evaluated by ID, initially concern for infection/sepsis and was transiently on norepinephrine.  BP now stable.  She was on broad spectrum antibiotics, but these have been stopped.  It is suspected that her nodular and cavitary lesions on CT are due to pulmonary infarction rather than infection.  However, cannot rule out super-infection in an immunosuppressed state.  - She remains off abx for now.  - ID recommend bronchoscopy with BAL, sending also for evaluation of AFB, fungus, PJP, viruses. I think that she is too high risk for this at this center.  Would recommend transfer to higher level of care to do this.  5. Pulmonary: Stable oxygen saturation on 3L [place].  Patient has cavitary lesions and GGO on HRCT chest done this admission.  Suspect chronic thromboembolic disease with pulmonary infarctions/cavitation.  As above, cannot rule out superinfection in immunocompromised state.  - Gentle diuresis with RV failure.  - No abx for now.  - Eventual bronch/BAL but high risk and think needs higher level of care for this.   Length of Stay: 4  Marca Ancona, MD  10/18/2022, 10:10 AM  Advanced Heart Failure Team Pager [phone] (M-F; 7a - 5p)  Please contact [facility] for night-coverage after hours (4p -7a ) and weekends on [email or web address]

## 2022-10-18 NOTE — Progress Notes (Signed)
[facility]  ROUNDING NOTE   Subjective:   Catherine [name]  is a 22 y.o. 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 Thedore Mins.  Patient was last seen in office on 09/20/2022.  Patient seen laying in bed, has been moved to MedSurg bed Remains on 3 L nasal cannula Tolerating small meals Trace lower extremity edema present   Objective:  Vital signs in last 24 hours:  Temp:  [97.6 F (36.4 C)-98.2 F (36.8 C)] 97.7 F (36.5 C) (04/17 0752) Pulse Rate:  [81-99] 81 (04/17 0752) Resp:  [16-30] 16 (04/17 0752) BP: (90-107)/(60-80) 107/73 (04/17 0752) SpO2:  [92 %-100 %] 99 % (04/17 0752) Weight:  [62.6 kg] 62.6 kg (04/17 0735)  Weight change:  Filed Weights   10/16/22 0500 10/17/22 0441 10/18/22 0735  Weight: 64.9 kg 63.1 kg 62.6 kg    Intake/Output: I/O last 3 completed shifts: In: 754.7 [I.V.:554.7; IV Piggyback:200] Out: 200 [Urine:200]   Intake/Output this shift:  No intake/output data recorded.  Physical Exam: General: NAD, resting comfortably  Head: Normocephalic, atraumatic. Moist oral mucosal membranes  Eyes: Anicteric  Lungs:  Diminished in bases, normal effort, NCO2  Heart: Regular rate and rhythm  Abdomen:  Soft, nontender  Extremities: Trace peripheral edema.  Neurologic: Alert and oriented, moving all four extremities  Skin: No lesions  Access: None    Basic Metabolic Panel: Recent Labs  Lab 10/14/22 0320 10/15/22 0256 10/16/22 0604 10/17/22 0449 10/18/22 0547  NA 132* 132* 136 137 133*  K 4.0 3.7 3.6 3.7 4.4  CL 103 107 108 108 108  CO2 19* 18* 21* 22 20*   GLUCOSE 229* 226* 141* 86 433*  BUN 30* 46* 48* 50* 47*  CREATININE 1.22* 1.32* 1.25* 1.19* 1.35*  CALCIUM 6.8* 7.4* 7.8* 7.9* 7.8*  MG 1.4* 2.2 2.0 1.7 1.8  PHOS 4.0 4.2 2.9 3.3 3.0     Liver Function Tests: Recent Labs  Lab 10/12/22 0847 10/13/22 2314  AST 18 18  ALT 11 12  ALKPHOS 113 115  BILITOT 0.4 0.6  PROT 4.2* 4.7*  ALBUMIN <1.5* <1.5*    Recent Labs  Lab 10/13/22 2314  LIPASE 27    No results for input(s): "AMMONIA" in the last 168 hours.  CBC: Recent Labs  Lab 10/13/22 2314 10/14/22 0457 10/15/22 0256 10/16/22 0604 10/17/22 0449 10/18/22 0547  WBC 19.0* 18.4* 23.9* 20.8* 21.0* 18.5*  NEUTROABS 12.8*  --   --   --   --   --   HGB 13.0 12.6 11.1* 11.0* 11.3* 10.8*  HCT 40.6 40.3 34.5* 34.7* 35.3* 34.3*  MCV 84.4 85.2 83.7 84.4 84.2 85.8  PLT 349 330 321 335 354 342     Cardiac Enzymes: No results for input(s): "CKTOTAL", "CKMB", "CKMBINDEX", "TROPONINI" in the last 168 hours.  BNP: Invalid input(s): "POCBNP"  CBG: Recent Labs  Lab 10/17/22 0817 10/17/22 1106 10/17/22 1553 10/17/22 2051 10/18/22 0815  GLUCAP 132* 196* 337* 363* 326*     Microbiology: Results for orders placed or performed during the hospital encounter of 10/13/22  Urine Culture     Status: Abnormal   Collection Time: 10/13/22 11:49 PM   Specimen: Urine, Clean Catch  Result Value Ref Range Status   Specimen Description   Final    URINE, CLEAN CATCH Performed at [facility], [address], [place], [place] [address]    Special Requests   Final    NONE Performed at [facility], [address] [place], [place] [address]    Culture (A)  Final    <10,000 COLONIES/mL INSIGNIFICANT GROWTH Performed at [facility], [address], [place], [place] [address]    Report Status 10/15/2022 FINAL  Final  Resp panel by RT-PCR (RSV, Flu A&B, Covid) Anterior Nasal Swab     Status: None   Collection Time: 10/13/22 11:51 PM   Specimen:  Anterior Nasal Swab  Result Value Ref Range Status   SARS Coronavirus 2 by RT PCR NEGATIVE NEGATIVE Final    Comment: (NOTE) SARS-CoV-2 target nucleic acids are NOT DETECTED.  The SARS-CoV-2 RNA is generally detectable in upper respiratory specimens during the acute phase of infection. The lowest concentration of SARS-CoV-2 viral copies this assay can detect is 138 copies/mL. A negative result does not preclude SARS-Cov-2 infection and should not be used as the sole basis for treatment or other patient management decisions. A negative result may occur with  improper specimen collection/handling, submission of specimen other than nasopharyngeal swab, presence of viral mutation(s) within the areas targeted by this assay, and inadequate number of viral copies(<138 copies/mL). A negative result must be combined with clinical observations, patient history, and epidemiological information. The expected result is Negative.  Fact Sheet for Patients:  BloggerCourse.com  Fact Sheet for Healthcare Providers:  SeriousBroker.it  This test is no t yet approved or cleared by the [place] FDA and  has been authorized for detection and/or diagnosis of SARS-CoV-2 by FDA under an Emergency Use Authorization (EUA). This EUA will remain  in effect (meaning this test can be used) for the duration of the COVID-19 declaration under Section 564(b)(1) of the Act, 21 U.S.C.section 360bbb-3(b)(1), unless the authorization is terminated  or revoked sooner.       Influenza A by PCR NEGATIVE NEGATIVE Final   Influenza B by PCR NEGATIVE NEGATIVE Final    Comment: (NOTE) The Xpert Xpress SARS-CoV-2/FLU/RSV plus assay is intended as an aid in the diagnosis of influenza from Nasopharyngeal swab specimens and should not be used as a sole basis for treatment. Nasal washings and aspirates are unacceptable for Xpert Xpress SARS-CoV-2/FLU/RSV testing.  Fact  Sheet for Patients: BloggerCourse.com  Fact Sheet for Healthcare Providers: SeriousBroker.it  This test is not yet approved or cleared by the [place] FDA and has been authorized for detection and/or diagnosis of SARS-CoV-2 by FDA under an Emergency Use Authorization (EUA). This EUA will remain in effect (meaning this test can be used) for the duration of the COVID-19 declaration under Section 564(b)(1) of the Act, 21 U.S.C. section 360bbb-3(b)(1), unless the authorization is terminated or revoked.     Resp Syncytial Virus by PCR NEGATIVE NEGATIVE Final    Comment: (NOTE) Fact Sheet for Patients: BloggerCourse.com  Fact Sheet for Healthcare Providers: SeriousBroker.it  This test is not yet approved or cleared by the [place] FDA and has been authorized for detection and/or diagnosis of SARS-CoV-2 by FDA under an Emergency Use Authorization (EUA). This EUA will remain in effect (meaning this test can be used) for the duration of the COVID-19 declaration under Section 564(b)(1) of the Act, 21 U.S.C. section 360bbb-3(b)(1), unless the authorization is terminated or revoked.  Performed at [facility], [address] [place], [place] [address]   Blood culture (routine x 2)     Status: None (Preliminary result)   Collection Time: 10/14/22  2:47 AM   Specimen: BLOOD  Result Value Ref Range Status   Specimen Description BLOOD BLOOD RIGHT ARM  Final   Special Requests   Final    BOTTLES DRAWN AEROBIC AND ANAEROBIC Blood Culture adequate volume   Culture   Final    NO GROWTH 4 DAYS Performed at [facility], [address] [place], [place] [address]    Report Status PENDING  Incomplete  Blood culture (routine x 2)     Status: None (Preliminary result)   Collection Time: 10/14/22  2:47 AM   Specimen: BLOOD  Result Value Ref Range Status   Specimen  Description BLOOD BLOOD RIGHT ARM  Final   Special Requests   Final    BOTTLES DRAWN AEROBIC AND ANAEROBIC Blood Culture adequate volume   Culture   Final    NO GROWTH 4 DAYS Performed at [facility], [address], [place], [place] [address]    Report Status PENDING  Incomplete  MRSA Next Gen by PCR, Nasal     Status: None   Collection Time: 10/14/22  4:34 AM   Specimen: Nasal Mucosa; Nasal Swab  Result Value Ref Range Status   MRSA by PCR Next Gen NOT DETECTED NOT DETECTED Final    Comment: (NOTE) The GeneXpert MRSA Assay (FDA approved for NASAL specimens only), is one component of a comprehensive MRSA colonization surveillance program. It is not intended to diagnose MRSA infection nor to guide or monitor treatment for MRSA infections. Test performance is not FDA approved in patients less than 10 years old. Performed at [facility], [address] [place], [place] [address]   Respiratory (~20 pathogens) panel by PCR     Status: None   Collection Time: 10/15/22 11:53 AM   Specimen: Nasopharyngeal Swab; Respiratory  Result Value Ref Range Status   Adenovirus NOT DETECTED NOT DETECTED Final   Coronavirus 229E NOT DETECTED NOT DETECTED Final    Comment: (NOTE) The Coronavirus on the Respiratory Panel, DOES NOT test for the novel  Coronavirus (2019 nCoV)    Coronavirus HKU1 NOT DETECTED NOT DETECTED Final   Coronavirus NL63 NOT DETECTED NOT DETECTED Final   Coronavirus OC43 NOT DETECTED NOT DETECTED Final   Metapneumovirus NOT DETECTED NOT DETECTED Final   Rhinovirus / Enterovirus NOT DETECTED NOT DETECTED Final   Influenza A NOT DETECTED NOT DETECTED Final   Influenza B NOT DETECTED NOT DETECTED Final   Parainfluenza Virus 1 NOT DETECTED NOT DETECTED Final   Parainfluenza Virus 2 NOT DETECTED NOT DETECTED Final   Parainfluenza Virus 3 NOT DETECTED NOT DETECTED Final   Parainfluenza Virus 4 NOT DETECTED NOT DETECTED Final   Respiratory Syncytial Virus  NOT DETECTED NOT DETECTED Final   Bordetella pertussis NOT DETECTED NOT DETECTED Final   Bordetella Parapertussis NOT DETECTED NOT DETECTED Final   Chlamydophila pneumoniae NOT DETECTED NOT DETECTED Final   Mycoplasma pneumoniae NOT DETECTED NOT DETECTED Final    Comment: Performed at [facility], [address], [place], [place] [address]    Coagulation Studies: No results for input(s): "LABPROT", "INR" in the last 72 hours.   Urinalysis: No results for input(s): "COLORURINE", "LABSPEC", "PHURINE", "GLUCOSEU", "HGBUR", "BILIRUBINUR", "KETONESUR", "PROTEINUR", "UROBILINOGEN", "NITRITE", "LEUKOCYTESUR" in the last 72 hours.  Invalid input(s): "APPERANCEUR"     Imaging: DG Chest  Port 1 View  Result Date: 10/18/2022 CLINICAL DATA:  Shortness of breath EXAM: PORTABLE CHEST 1 VIEW COMPARISON:  CXR 10/14/22, CT chest 10/14/22 FINDINGS: Small bilateral pleural effusions. No pneumothorax. Redemonstrated are extensive bilateral patchy airspace opacities which appear similar to prior exam. Unchanged cardiac and mediastinal contours. No radiographically apparent displaced rib fractures. Visualized upper abdomen is unremarkable chronic right clavicular fracture. IMPRESSION: 1. Small bilateral pleural effusions. 2. Extensive bilateral patchy airspace opacities which appear similar to prior exam and are better characterized on prior chest CT. Electronically Signed   By: Lorenza Cambridge M.D.   On: 10/18/2022 10:44     Medications:    sodium chloride Stopped (10/17/22 1018)   heparin 1,150 Units/hr (10/18/22 0220)    Chlorhexidine Gluconate Cloth  6 each Topical Daily   furosemide  40 mg Intravenous Once   guaiFENesin  600 mg Oral BID   insulin aspart  0-5 Units Subcutaneous QHS   insulin aspart  0-9 Units Subcutaneous TID WC   insulin aspart  5 Units Subcutaneous TID WC   insulin glargine-yfgn  28 Units Subcutaneous QHS   predniSONE  30 mg Oral Q breakfast   Followed by   Melene Muller ON  10/21/2022] predniSONE  20 mg Oral Q breakfast   Followed by   Melene Muller ON 10/24/2022] predniSONE  10 mg Oral Q breakfast   sodium chloride flush  3 mL Intravenous Q12H   Vitamin D (Ergocalciferol)  50,000 Units Oral Q7 days   acetaminophen, benzonatate, chlorpheniramine-HYDROcodone, docusate sodium, ibuprofen, ipratropium-albuterol, menthol-cetylpyridinium, ondansetron (ZOFRAN) IV, mouth rinse, polyethylene glycol  Assessment/ Plan:  [name] is a 22 y.o.  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 Most recent urine with 2.3 grams of proteinuria on 09/20/2022. With hypoalbuminemia.  Baseline creatinine 1.08  Renal function stable.  Tacrolimus remains held due to possible infectious source.  Will continue to monitor.   Pulmonary embolism: with hypoalbuminemia and nephrotic syndrome.  Was discharged home subcu Lovenox and Coumadin Heparin drip in place. Pulmonology consulted and and following    Hypotension, possibly secondary to infection.  Successfully weaned off levo.  IV furosemide increased to 40 mg daily.   Secondary Hyperparathyroidism: not currently on an activated vitamin D.  Will continue to monitor bone minerals during this admission.  No overt concerns noted at this time.  5.  Diabetes mellitus, type 1.  Not well-controlled.  With chronic kidney disease.  A1c 9.1 on 08/25/2022.  Glucose elevated due to steroid use.     LOS: 4   4/17/202411:00 AM

## 2022-10-18 NOTE — Progress Notes (Signed)
Date of Admission:  10/13/2022      ID: Catherine [name] is a 22 y.o. female Principal Problem:   Sepsis associated hypotension Active Problems:   Pulmonary infarct   CTEPH (chronic thromboembolic pulmonary hypertension)   Severe pulmonary hypertension  Friend at bed side  Subjective:  Sitting comfortably in bed No distress States she is feeling better  Medications:   Chlorhexidine Gluconate Cloth  6 each Topical Daily   furosemide  40 mg Intravenous Once   guaiFENesin  600 mg Oral BID   insulin aspart  0-5 Units Subcutaneous QHS   insulin aspart  0-9 Units Subcutaneous TID WC   insulin aspart  5 Units Subcutaneous TID WC   insulin glargine-yfgn  28 Units Subcutaneous QHS   predniSONE  30 mg Oral Q breakfast   Followed by   Melene Muller ON 10/21/2022] predniSONE  20 mg Oral Q breakfast   Followed by   Melene Muller ON 10/24/2022] predniSONE  10 mg Oral Q breakfast   sodium chloride flush  3 mL Intravenous Q12H   Vitamin D (Ergocalciferol)  50,000 Units Oral Q7 days    Objective: Vital signs in last 24 hours: Patient Vitals for the past 24 hrs:  BP Temp Temp src Pulse Resp SpO2 Weight  10/18/22 1158 105/70 98 F (36.7 C) -- 81 20 100 % --  10/18/22 0752 107/73 97.7 F (36.5 C) -- 81 16 99 % --  10/18/22 0735 -- -- -- -- -- -- 62.6 kg  10/18/22 0402 107/67 98.2 F (36.8 C) -- 91 18 100 % --  10/17/22 2300 90/60 98 F (36.7 C) Oral 93 18 93 % --  10/17/22 2050 92/68 97.6 F (36.4 C) -- 95 18 97 % --  10/17/22 1753 103/79 97.8 F (36.6 C) Oral 92 (!) 24 100 % --  10/17/22 1700 107/72 -- -- 94 (!) 23 96 % --  10/17/22 1600 91/71 -- -- 95 (!) 30 96 % --  10/17/22 1500 107/77 -- -- 98 (!) 28 95 % --      PHYSICAL EXAM:  General: Alert, cooperative, no distress, appears stated age.  Lungs: Bilateral air entry Few crepitation scattered on both sides  heart: Tachycardia Abdomen: Soft, non-tender,not distended. Bowel sounds normal. No masses Extremities: Some edema of the  extremities   skin: No rashes or lesions. Or bruising Lymph: Cervical, supraclavicular normal. Neurologic: Grossly non-focal  Lab Results    Latest Ref Rng & Units 10/18/2022    5:47 AM 10/17/2022    4:49 AM 10/16/2022    6:04 AM  CBC  WBC 4.0 - 10.5 K/uL 18.5  21.0  20.8   Hemoglobin 12.0 - 15.0 g/dL 16.1  09.6  04.5   Hematocrit 36.0 - 46.0 % 34.3  35.3  34.7   Platelets 150 - 400 K/uL 342  354  335        Latest Ref Rng & Units 10/18/2022    5:47 AM 10/17/2022    4:49 AM 10/16/2022    6:04 AM  CMP  Glucose 70 - 99 mg/dL 409  86  811   BUN 6 - 20 mg/dL 47  50  48   Creatinine 0.44 - 1.00 mg/dL 9.14  7.82  9.56   Sodium 135 - 145 mmol/L 133  137  136   Potassium 3.5 - 5.1 mmol/L 4.4  3.7  3.6   Chloride 98 - 111 mmol/L 108  108  108   CO2 22 - 32 mmol/L  Calcium 8.9 - 10.3 mg/dL 7.8  7.9  7.8       Microbiology: 10/14/2022 blood culture no growth 10/14/2022 nares MRSA no growth Respiratory panel by PCR negative  EKG  EKG RVH Studies/Results: CT chest multifocal areas of nodularity with some cavitation right more than left Subpleural cystic changes right more than left   Assessment/Plan: 22 year old female with history of insulin-dependent diabetes mellitus, minimal-change disease causing nephrotic syndrome, recent massive pulmonary embolism needing mechanical thrombectomy, causing bilateral pulmonary infarcts right more than left with some cavitation.  She presented with weakness dizziness shortness of breath and cough.  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 The nodular and cavitary lesions are very likely pulmonary infarcts As she is immunocompromised due to tacrolimus and steroids we may need to  rule out secondary opportunistic infection like nocardia and fungal infection, but clinically no evidence to suggest that Labs sent for beta D glucan, fungal antibodies- pending Toxo,  crypto, quantiferon gold  neg This is less likely to be regular bacterial infection She is off antibiotics  She is going to need a diagnostic test like bronchoscopy with BAL and the fluid to be sent for AFB fungal viral culture and PJP  But because of increased right-sided pressure and the need for a monitored setting pulmonologist do not think it is safe to do Bronch here and also will need cardiac anesthesia Pt will have to go to a tertiary center- for further management   CKD Secondary to minimal change disease Nephrotic syndrome on steroids which is being tapered  She was taking tacrolimus at home Anemia  Insulin-dependent diabetes mellitus poorly controlled On insulin  Discussed the management with the patient , Dr.Kumar, Dr.Gonzalez

## 2022-10-18 NOTE — Discharge Summary (Signed)
[facility] Discharge Summary   Patient: Catherine [name] JYN:[id number]  PCP: [facility]  Date of admission: 10/13/2022   Date of discharge:  10/18/2022     Discharge Diagnoses:  Principal Problem:   Sepsis associated hypotension Active Problems:   Pulmonary infarct   CTEPH (chronic thromboembolic pulmonary hypertension)   Severe pulmonary hypertension   Admitted From: Home Disposition:  [facility]  Accepting physician: Dr Aubery Lapping (pulmonologist)   Recommendations for Outpatient Follow-up:  Follow instructions after discharge from [facility] Follow up LABS/TEST:     Diet recommendation: Cardiac and Carb modified diet  Activity: The patient is advised to gradually reintroduce usual activities, as tolerated  Discharge Condition: stable  Code Status: Full code   History of present illness: As per the H and P dictated on admission Hospital Course:  22 y.o female with significant multiple PMH of uncontrolled TIDM, CKD stage IIIa with minimal-change disease and nephrotic syndrome, secondary hyperparathyroidism, hypoalbuminemia, HLD, HTN, polysubstance abuse (tobacco abuse, marijuana use), recent pulmonary embolism with right heart strain and multiple pulmonary infarcts, and cavitary lesions who presented to the ED  on 10/13/22 with chief complaints of hemoptysis and dizziness.   On review of her chart, patient has had multiple admissions and ED visit for multiple medical issues.  From 06/30/2022 to 07/05/2022 patient was admitted at [facility] due to AKI on CKD, proteinuria, hypoalbuminemia.  Ultrasound DVT was negative patient started on Eliquis for prophylactic anticoagulation due to concerns for hypercoagulability secondary to hypoalbuminemia/nephrotic syndrome.  Patient apparently ran out of Eliquis and was admitted between 08/22/2022 to 08/25/2022 with acute submassive PE with right heart strain as well as cavitary lesions.  Patient previously received  rituximab with QuantiFERON gold performed on 07/05/2022 which was negative for.  She was also started on Bactrim for prophylaxis.  Patient underwent thrombolysis and mechanical thrombectomy on 08/22/2022.  On 08/30/2022, patient returned to ED with shortness of breath given repeat CT chest angiogram showed interval development of consolidation and airspace disease and bulky bilateral pulmonary embolism with occlusion in the right middle lobe suggesting infarct she was restarted on heparin drip and was transitioned to Lovenox 1 mg per cake twice daily bridging to Coumadin Coumadin with INR goal of 2-3.  Patient was seen by ID for pulmonary infiltrates/cavitary lesions and pulmonologist who did not recommend bronchoscopy at that time due to high risk. Patient followed up with pulmonologist on 09/12/2022 who recommended CT chest high-resolution for further evaluation.  Patient was seen in the ED again on 09/20/2022, 09/25/2022 and 10/12/22 with hemoptysis and hyperglycemia and lower extremity edema.   ED Course: Initial vital signs showed HR of 86 beats/minute, BP 94/53 mm Hg, the RR 22 breaths/minute, and the oxygen saturation 100% on RA  and a temperature of 98.66F (36.7C).    Pertinent Labs/Diagnostics Findings: Na+/ K+: 133/3.4 glucose: 214 BUN/Cr.:  34/1.29 albumin < 1.5 WBC:19.0 Hgb/Hct: 13.0/40.6  PCT: negative <0.10 Lactic acid:1.6 COVID PCR: Negative, Troponin: 37 BNP: 1182.8 CXR> right pleural effusion and ill-defined airspace disease throughout the right lung   Patient given 1.5L of fluids and started on broad-spectrum abx for suspected sepsis due to elevated white count and abnormal chest x-ray.  Repeat chest x-ray showed worsening diffuse patchy airspace opacity.  Patient received IV Lasix for possible volume overload.  Patient remained hypotensive and therefore was started on Levophed. PCCM consulted.   Patient was admitted to the ICU on 4/13, Levophed weaned off, currently vital stable.  Transferred  to [facility]  hospitalist service on 10/15/2022.  Nephrology is following, ID will be consulted, further management as below.   Assessment and Plan: # Sepsis due to multifocal pneumonia Prior CT showed cavitary lesions of upper lobes right greater than left. QuantiFERON gold performed on 07/05/2022 negative. On 4/13 CT chest: Bilateral pneumonia, right middle lobe masslike lesion could be septic emboli, atypical infection or vasculitis. Small bilateral pleural effusion. Blood culture NGTD, strep pneumo antigen negative, Legionella negative.  S/p Levophed in the ICU weaned off, vital signs stable now S/p 2 L of NS/LR & Cefepime/ Vancomycin/ Azithromycin S/p Cefepime 2 g IV every 12 hourly d/c'd on 4/16 as per ID TTE shows LVEF 60 to 65%, severely enlarged right ventricle, severely elevated pulmonary hypertension, severe tricuspid valve regurgitation, moderate pulmonary valve regurgitation, Small pericardial effusion, no cardiac tamponade ID consulted, recommended lung biopsy and discussion with pulmonologist Bronchoscopy cannot be done in the setting due to right ventricular failure, severe pulm hypertension and tricuspid regurgitation.  Recommended to transfer to [facility] D/w cardiology, recommended that patient ((eventually will need a RHC, Recommend VQ scan for evaluation CTEPH. Small pericardial effusion was noted on echocardiogram without tamponade. Continued on furosemide 20 mg IV daily -avoid over dieresis. Can consider repeat limited echo closer to discharge to reevaluate effusion. -Patient requested transfer to [facility] to have continued workup completed there as her other providers are there)) I called [facility] on 4/16 and 4/17 but does not have any beds, 4/17 today patient got accepted at [facility] by Dr. Aubery Lapping pulmonologist for management of CTEPH   #Pulmonary Embolism with recurrent Hemoptysis S/p thrombolysis and thrombectomy 08/22/22 recent CT on 4/11 re demonstrated near close if PE of the right lung with  elevated RV to LV ratio and new diffuse groundglass opacities and small bilateral pleural effusion, and multiple cavitary lesions Supplemental oxygen as needed, to maintain SpO2 > 90% INR 1.5, was taking Lovenox and Coumadin due to failure on Eliquis  continue Heparin gtt, transition to Coumadin when stable PRN Bronchodilators  s/p Solu-Medrol 40 mg for 3 days, started tapering prednisone from 4/17.  Prednisone can be restarted if needed. Smoking Cessation counseling  #AKI on CKD I Proteinuria I Hx of Nephrotic Syndrome w/ Minimal Change Disease  Baseline creatinine 0.64, Cr on admission 1.29 slighty improved from 1.48 on 09/05/22, potential pre-renal AKI in the setting of hypotension From chart review it appears that she has received Cytoxan, CellCept, and Rituximab in the past. Hold home tacrolimus for now, trough level 0.8 low s/p 1 L LR in ED, takes Torsemide PRN at home -Prior Renal ultrasound on w/ mild bilateral renal pelviectasis and caliectasis -Prednisone at home?Marland Kitchen Has required steroid tapers in the past (self-tapers by checking urine dipstick at home), start prednisone on discharge if needed Nephrology consult, On 4/13 started Lasix 20 mg IV daily and On 4/17 lasix 40 mg IV x 1 dose given by cardio  # Poorly Controlled Type 1 DM:  Diagnosed at age 49. Complicated by minimal change disease on tacrolimus and PRN prednisone for flares, Last A1c 9.1. Follows with [facility] End[facility].  4/17 increased Semglee 28 units nightly, NovoLog 5 units 3 times daily and NovoLog sliding scale, continue diabetic diet, monitor CBG. ( home dose Lantus 15 units nightly)   Vitamin D deficiency: started vitamin D 50,000 units p.o. weekly, follow with PCP to repeat vitamin D level after 3 to 6 months. Chronic Problems HTN: not currently on medication at home  HLD: no longer taking Atorvastatin   Body  mass index is 27.87 kg/m.  Nutrition Interventions:   On the day of the discharge the patient's vitals  were stable, and no other acute medical condition were reported by patient. the patient was felt safe to be discharge to [facility] under pulmonologist Dr. Otto Herb,  Theron Arista   Consultants: PCCM, Nephrology, Cards and ID Procedures: None  Discharge Exam: General: Appear in mild resp distress, no Rash; Oral Mucosa Clear, moist. Cardiovascular: S1 and S2 Present, no Murmur, Respiratory: increased  respiratory effort, good Air entry b/l, mild Crackles, no wheezes Abdomen: Bowel Sound present, Soft and no tenderness, no hernia Extremities: no Pedal edema, no calf tenderness Neurology: alert and oriented to time, place, and person affect appropriate.  Filed Weights   10/16/22 0500 10/17/22 0441 10/18/22 0735  Weight: 64.9 kg 63.1 kg 62.6 kg   Vitals:   10/18/22 1158 10/18/22 1534  BP: 105/70 100/61  Pulse: 81 85  Resp: 20 16  Temp: 98 F (36.7 C) 97.8 F (36.6 C)  SpO2: 100% 100%    DISCHARGE MEDICATION: Allergies as of 10/18/2022   No Known Allergies      Medication List     STOP taking these medications    ketorolac 10 MG tablet Commonly known as: TORADOL   oxyCODONE 5 MG immediate release tablet Commonly known as: Roxicodone       TAKE these medications    acetaminophen 325 MG tablet Commonly known as: TYLENOL Take 2 tablets (650 mg total) by mouth every 6 (six) hours as needed for mild pain or headache.   benzonatate 100 MG capsule Commonly known as: Tessalon Perles Take 1 capsule (100 mg total) by mouth 3 (three) times daily as needed for cough.   blood glucose meter kit and supplies Dispense based on patient and insurance preference. Use up to four times daily as directed. (FOR ICD-10 E10.9, E11.9).   calcitRIOL 0.25 MCG capsule Commonly known as: ROCALTROL Take 0.25 mcg by mouth as directed. Take 1 capsule (0.25 mcg) three times a week.   chlorpheniramine-HYDROcodone 10-8 MG/5ML Commonly known as: TUSSIONEX Take 5 mLs by mouth every 12 (twelve)  hours as needed for cough.   enoxaparin 60 MG/0.6ML injection Commonly known as: LOVENOX Inject 0.575 mLs (57.5 mg total) into the skin every 12 (twelve) hours.   feeding supplement (GLUCERNA SHAKE) Liqd Take 237 mLs by mouth 2 (two) times daily between meals.   glucagon 1 MG injection Inject 1 mg into the muscle as needed.   heparin 16109 UT/250ML infusion Inject 1,150 Units/hr into the vein continuous.   insulin aspart 100 UNIT/ML injection Commonly known as: novoLOG Inject 5-8 Units into the skin 3 (three) times daily before meals.   Lantus 100 UNIT/ML injection Generic drug: insulin glargine Inject 0.15 mLs (15 Units total) into the skin at bedtime.   multivitamin with minerals Tabs tablet Take 1 tablet by mouth daily.   sulfamethoxazole-trimethoprim 800-160 MG tablet Commonly known as: BACTRIM DS Take 1 tablet by mouth every Monday, Wednesday, and Friday.   tacrolimus 1 MG capsule Commonly known as: PROGRAF Take 2 capsules (2 mg total) by mouth 2 (two) times daily.   torsemide 20 MG tablet Commonly known as: DEMADEX Take 1 tablet (20 mg total) by mouth daily.   torsemide 20 MG tablet Commonly known as: DEMADEX Take 1 tablet (20 mg total) by mouth daily.   Vitamin D (Ergocalciferol) 1.25 MG (50000 UNIT) Caps capsule Commonly known as: DRISDOL Take 50,000 Units by mouth every 7 (seven) days.  warfarin 5 MG tablet Commonly known as: COUMADIN Take 1 tablet (5 mg total) by mouth daily.       No Known Allergies Discharge Instructions     Call MD for:  difficulty breathing, headache or visual disturbances   Complete by: As directed    Call MD for:  extreme fatigue   Complete by: As directed    Call MD for:  persistant dizziness or light-headedness   Complete by: As directed    Call MD for:  severe uncontrolled pain   Complete by: As directed    Call MD for:  temperature >100.4   Complete by: As directed    Diet - low sodium heart healthy   Complete by:  As directed    Diet Carb Modified   Complete by: As directed    Discharge instructions   Complete by: As directed    Follow instructions after discharge from [facility]   Increase activity slowly   Complete by: As directed        The results of significant diagnostics from this hospitalization (including imaging, microbiology, ancillary and laboratory) are listed below for reference.    Significant Diagnostic Studies: DG Chest Port 1 View  Result Date: 10/18/2022 CLINICAL DATA:  Shortness of breath EXAM: PORTABLE CHEST 1 VIEW COMPARISON:  CXR 10/14/22, CT chest 10/14/22 FINDINGS: Small bilateral pleural effusions. No pneumothorax. Redemonstrated are extensive bilateral patchy airspace opacities which appear similar to prior exam. Unchanged cardiac and mediastinal contours. No radiographically apparent displaced rib fractures. Visualized upper abdomen is unremarkable chronic right clavicular fracture. IMPRESSION: 1. Small bilateral pleural effusions. 2. Extensive bilateral patchy airspace opacities which appear similar to prior exam and are better characterized on prior chest CT. Electronically Signed   By: Lorenza Cambridge M.D.   On: 10/18/2022 10:44   ECHOCARDIOGRAM COMPLETE  Result Date: 10/16/2022    ECHOCARDIOGRAM REPORT   Patient Name:   LEDIA HANFORD Date of Exam: 10/15/2022 Medical Rec #:  [id number]           Height:       59.0 in Accession #:    [id number]          Weight:       138.7 lb Date of Birth:  2000/09/26           BSA:          1.579 m Patient Age:    21 years            BP:           94/75 mmHg Patient Gender: F                   HR:           87 bpm. Exam Location:  [facility] Procedure: 2D Echo Indications:     CHF I50.31  History:         Patient has prior history of Echocardiogram examinations, most                  recent 08/22/2022.  Sonographer:     Overton Mam RDCS Referring Phys:  [id number] Gillis Santa Diagnosing Phys: Chilton Si MD IMPRESSIONS  1. Left ventricular  ejection fraction, by estimation, is 60 to 65%. The left ventricle has normal function. The left ventricle has no regional wall motion abnormalities. There is mild concentric left ventricular hypertrophy. Left ventricular diastolic parameters were normal. There is the interventricular septum is flattened in systole and diastole, consistent  with right ventricular pressure and volume overload.  2. Right ventricular systolic function is mildly reduced. The right ventricular size is severely enlarged. There is severely elevated pulmonary artery systolic pressure.  3. A small pericardial effusion is present. The pericardial effusion is localized near the right ventricle and anterior to the right ventricle. There is no evidence of cardiac tamponade.  4. The mitral valve is normal in structure. No evidence of mitral valve regurgitation. No evidence of mitral stenosis.  5. Tricuspid valve regurgitation is severe.  6. The aortic valve is tricuspid. Aortic valve regurgitation is not visualized. No aortic stenosis is present.  7. Pulmonic valve regurgitation is moderate.  8. The inferior vena cava is normal in size with greater than 50% respiratory variability, suggesting right atrial pressure of 3 mmHg. FINDINGS  Left Ventricle: Left ventricular ejection fraction, by estimation, is 60 to 65%. The left ventricle has normal function. The left ventricle has no regional wall motion abnormalities. The left ventricular internal cavity size was normal in size. There is  mild concentric left ventricular hypertrophy. The interventricular septum is flattened in systole and diastole, consistent with right ventricular pressure and volume overload. Left ventricular diastolic parameters were normal. Indeterminate filling pressures. Right Ventricle: The right ventricular size is severely enlarged. No increase in right ventricular wall thickness. Right ventricular systolic function is mildly reduced. There is severely elevated pulmonary  artery systolic pressure. The tricuspid regurgitant velocity is 5.12 m/s, and with an assumed right atrial pressure of 3 mmHg, the estimated right ventricular systolic pressure is 107.9 mmHg. Left Atrium: Left atrial size was normal in size. Right Atrium: Right atrial size was normal in size. Pericardium: A small pericardial effusion is present. The pericardial effusion is localized near the right ventricle and anterior to the right ventricle. There is excessive respiratory variation in the tricuspid valve spectral Doppler velocities. There is no evidence of cardiac tamponade. Mitral Valve: The mitral valve is normal in structure. No evidence of mitral valve regurgitation. No evidence of mitral valve stenosis. Tricuspid Valve: The tricuspid valve is normal in structure. Tricuspid valve regurgitation is severe. No evidence of tricuspid stenosis. Aortic Valve: The aortic valve is tricuspid. Aortic valve regurgitation is not visualized. No aortic stenosis is present. Aortic valve peak gradient measures 5.0 mmHg. Pulmonic Valve: The pulmonic valve was normal in structure. Pulmonic valve regurgitation is moderate. No evidence of pulmonic stenosis. Aorta: The aortic root is normal in size and structure. Venous: The inferior vena cava is normal in size with greater than 50% respiratory variability, suggesting right atrial pressure of 3 mmHg. IAS/Shunts: No atrial level shunt detected by color flow Doppler.  LEFT VENTRICLE PLAX 2D LVIDd:         2.40 cm     Diastology LVIDs:         1.70 cm     LV e' medial:    8.92 cm/s LV PW:         1.30 cm     LV E/e' medial:  9.2 LV IVS:        1.10 cm     LV e' lateral:   10.80 cm/s LVOT diam:     1.70 cm     LV E/e' lateral: 7.6 LV SV:         25 LV SV Index:   16 LVOT Area:     2.27 cm  LV Volumes (MOD) LV vol d, MOD A4C: 18.0 ml LV vol s, MOD A4C: 7.3 ml LV SV  MOD A4C:     18.0 ml RIGHT VENTRICLE RV Basal diam:  4.40 cm RV S prime:     11.20 cm/s TAPSE (M-mode): 1.9 cm LEFT ATRIUM            Index       RIGHT ATRIUM           Index LA diam:      2.70 cm 1.71 cm/m  RA Area:     12.60 cm LA Vol (A2C): 14.6 ml 9.25 ml/m  RA Volume:   28.30 ml  17.93 ml/m LA Vol (A4C): 6.9 ml  4.39 ml/m  AORTIC VALVE                 PULMONIC VALVE AV Area (Vmax): 1.62 cm     PV Vmax:          0.85 m/s AV Vmax:        112.00 cm/s  PV Peak grad:     2.9 mmHg AV Peak Grad:   5.0 mmHg     PR End Diast Vel: 25.50 msec LVOT Vmax:      79.80 cm/s   RVOT Peak grad:   1 mmHg LVOT Vmean:     48.000 cm/s LVOT VTI:       0.111 m  AORTA Ao Root diam: 2.70 cm        PULMONARY ARTERY Ao Asc diam:  2.10 cm        MPA diam:        2.40 cm MITRAL VALVE               TRICUSPID VALVE MV Area (PHT): 4.39 cm    TR Peak grad:   104.9 mmHg MV Decel Time: 173 msec    TR Vmax:        512.00 cm/s MV E velocity: 81.80 cm/s MV A velocity: 77.60 cm/s  SHUNTS MV E/A ratio:  1.05        Systemic VTI:  0.11 m                            Systemic Diam: 1.70 cm Chilton Si MD Electronically signed by Chilton Si MD Signature Date/Time: 10/16/2022/6:06:16 AM    Final    CT Chest High Resolution  Result Date: 10/14/2022 CLINICAL DATA:  Hemoptysis, dizziness and nephrotic syndrome. Recent sub massive pulmonary embolus with right heart dysfunction. Nephrotic syndrome. EXAM: CT CHEST WITHOUT CONTRAST TECHNIQUE: Multidetector CT imaging of the chest was performed following the standard protocol without intravenous contrast. High resolution imaging of the lungs, as well as inspiratory and expiratory imaging, was performed. RADIATION DOSE REDUCTION: This exam was performed according to the departmental dose-optimization program which includes automated exposure control, adjustment of the mA and/or kV according to patient size and/or use of iterative reconstruction technique. COMPARISON:  10/12/2022 and 09/25/2022 and 08/22/2022. FINDINGS: Cardiovascular: Mild cardiac enlargement. Small pericardial effusion appears similar to previous  exam. Mediastinum/Nodes: Thyroid gland, trachea, and esophagus are unremarkable. No enlarged mediastinal or axillary lymph nodes. Hilar structures are suboptimally evaluated due to lack of IV contrast. Lungs/Pleura: Small right pleural effusion and trace left pleural effusion are again noted. There is a large geographic area of progressive ground-glass attenuation involving the right upper lobe, image 35/2. A patchy area of ground-glass attenuation is noted within the medial left upper lobe which is also increased from the previous exam more generalized diffuse ground-glass attenuation is noted within  the right middle lobe and both lower lobes. -Peripheral, nodular consolidation with central cavitation within the right upper lobe measures 2.1 x 1.1 cm, image 38/2. Formally 2.1 by 0.7 cm. -Within the posterolateral right lung base there is a subpleural cavitary process measuring 3.0 x 1.8 cm, image 43/2. Previously 2.8 x 1.7 cm. -Consolidation with central cavitation involving the right middle lobe is again identified. This measures 5.5 x 4.3 cm, image 64/2. On the previous exam this area measured 6.1 x 3.7 cm. -New rounded area of masslike consolidation within the central right middle lobe extending across the posterior major fissure into the superior segment of right lower lobe measures 2.3 x 2.8 cm, image 61/2. -Similar appearance of subpleural consolidation within the posteromedial right base, image 85/2. Subpleural cystic changes are identified overlying bilateral posterior lung bases and to a lesser extent the subpleural left apex, image 17/2. Upper Abdomen: No acute abnormality. Musculoskeletal: No chest wall mass or suspicious bone lesions identified. IMPRESSION: 1. Large geographic area of progressive ground-glass attenuation is identified within the right upper lobe. Smaller area of progressive subpleural ground-glass attenuation is noted within the left upper lobe. These areas may represent foci of  alveolar hemorrhage, infection, or edema. 2. Multifocal areas of nodular and masslike architectural distortion with internal cavitation are again noted. The largest areas in the right middle lobe. As mention previously these may represent areas of pulmonary infarct with internal necrosis. Differential considerations include septic emboli, atypical infection or vasculitis. 3. New area of nodular masslike architectural distortion within the right middle lobe and superior segment of the right lower lobe (centered around the major fissure). This may also represent an area of evolving pulmonary infarct, embolic phenomenon, infection or vasculitis. 4. Unchanged small right pleural effusion and trace left pleural effusion. 5. Subpleural cystic changes are again seen within both lung bases. Etiology is indeterminate. Cannot exclude underlying fibrotic interstitial lung disease. Electronically Signed   By: Signa Kell M.D.   On: 10/14/2022 11:05   CT HEAD WO CONTRAST ( )  Result Date: 10/14/2022 CLINICAL DATA:  Syncope/presyncope. EXAM: CT HEAD WITHOUT CONTRAST TECHNIQUE: Contiguous axial images were obtained from the base of the skull through the vertex without intravenous contrast. RADIATION DOSE REDUCTION: This exam was performed according to the departmental dose-optimization program which includes automated exposure control, adjustment of the mA and/or kV according to patient size and/or use of iterative reconstruction technique. COMPARISON:  None Available. FINDINGS: Brain: No evidence of acute infarction, hemorrhage, hydrocephalus, extra-axial collection or mass lesion/mass effect. Vascular: No hyperdense vessel or unexpected calcification. Skull: Normal. Negative for fracture or focal lesion. Sinuses/Orbits: No acute finding. IMPRESSION: Negative head CT. Electronically Signed   By: Tiburcio Pea M.D.   On: 10/14/2022 04:38   DG Chest Portable 1 View  Result Date: 10/14/2022 CLINICAL DATA:  worsening  SOB EXAM: PORTABLE CHEST 1 VIEW COMPARISON:  Chest x-ray 10/13/2022 FINDINGS: The heart and mediastinal contours are unchanged. Interval worsening of diffuse patchy airspace opacities within the right lung. No pulmonary edema. No pleural effusion. No pneumothorax. No acute osseous abnormality. IMPRESSION: Interval worsening of diffuse patchy airspace opacities within the right lung. Electronically Signed   By: Tish Frederickson M.D.   On: 10/14/2022 01:51   DG Chest Port 1 View  Result Date: 10/13/2022 CLINICAL DATA:  Pain. EXAM: PORTABLE CHEST 1 VIEW COMPARISON:  Radiograph and CT yesterday FINDINGS: Similar right pleural effusion, the left pleural effusion on CT is not well-defined by radiograph. Ill-defined airspace disease throughout the  right lung, similar in radiographic appearance. The cavitary components on CT are not well-defined by radiograph. Stable heart size and mediastinal contours. No pneumothorax. Remote right clavicle fracture. IMPRESSION: Unchanged appearance of the chest with right pleural effusion and ill-defined airspace disease throughout the right lung. Cavitary components on CT are not well-defined by radiograph. Electronically Signed   By: Narda Rutherford M.D.   On: 10/13/2022 23:17   CT Chest W Contrast  Result Date: 10/12/2022 CLINICAL DATA:  Pneumonia suspected EXAM: CT CHEST WITH CONTRAST TECHNIQUE: Multidetector CT imaging of the chest was performed during intravenous contrast administration. RADIATION DOSE REDUCTION: This exam was performed according to the departmental dose-optimization program which includes automated exposure control, adjustment of the mA and/or kV according to patient size and/or use of iterative reconstruction technique. CONTRAST:  75mL OMNIPAQUE IOHEXOL 300 MG/ML  SOLN COMPARISON:  Multiple priors, most recent chest CT dated September 25, 2022 FINDINGS: Cardiovascular: Unchanged near occlusive pulmonary embolus of the distal right pulmonary artery extending  into the interlobar and middle lobe lobar artery. Additional scattered areas of segmental and subsegmental pulmonary embolus seen. Normal heart size. Trace pericardial effusion. Dilated main pulmonary artery, measuring up to 3.2 cm. Elevated RV to LV ratio of 1.6, similar to prior. Mediastinum/Nodes: No enlarged lymph nodes seen in the chest. Thyroid and esophagus are unremarkable. Lungs/Pleura: Central airways are patent. Peripheral wedge-shaped area of consolidation of the right middle lobe with new associated cavitation, overall decreased in size when compared with the prior exam but increased in density. Additional nodular opacity of the right upper lobe measuring 2.1 x 0.9 cm on series 3, image 61, previously 2.2 x 1.1 cm, decreased in size with new cavitation. Other previously cavitary seen lesions are unchanged when compared with the prior exam. New diffuse ground-glass opacities. New small bilateral pleural effusions. Decreased patchy consolidations of the right upper lobe. Similar patchy consolidations of the right-greater-than-left lung bases. Similar cystic change of the costophrenic angles. Upper Abdomen: No acute abnormality. Musculoskeletal: No chest wall abnormality. No acute or significant osseous findings. IMPRESSION: 1. Unchanged near occlusive pulmonary embolus of the right interlobar artery and occlusive thrombus of the right middle lobe lobar artery. Additional scattered bilateral areas of segmental and subsegmental pulmonary emboli again seen, similar to prior. 2. Elevated RV to LV ratio, similar to prior exam, consistent with right heart dysfunction. 3. New diffuse ground-glass opacities and small bilateral pleural effusions, likely due to pulmonary edema. 4. Multiple cavitary lesions again seen. A right middle lobe consolidation and right upper lobe nodular opacity demonstrates new cavitation. Other previously cavitary seen lesions are unchanged when compared with the prior exam. Differential  considerations include, evolving pulmonary infarcts, septic emboli, atypical infection, or vasculitis such as GPA. 5. Decreased patchy consolidations of the right upper lobe. Similar patchy consolidations of the right-greater-than-left lung bases. Findings are likely due to infectious or inflammatory etiology. 6. Similar cystic change of the costophrenic angles. Consider dedicated ILD protocol CT after resolution of acute symptoms for better evaluation. Electronically Signed   By: Allegra Lai M.D.   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 Central cavitation area in the right mid lung is better demonstrated by comparison chest CT. Small right effusion suspected blunting the right costophrenic angle. Heart is enlarged.  No superimposed edema pattern. Negative for pneumothorax. Trachea midline. Old right mid clavicle fracture noted. IMPRESSION: 1. Worsening diffuse right lung and left lower lobe airspace process concerning for multifocal pneumonia. 2. Small right effusion. 3. Cardiomegaly without CHF. Electronically Signed   By: Judie Petit.  Shick M.D.   On: 10/12/2022 10:45   CT Angio Chest PE W and/or Wo Contrast  Result Date: 09/25/2022 CLINICAL DATA:  High probability for PE. Back pain and shortness of breath. EXAM: CT ANGIOGRAPHY CHEST WITH CONTRAST TECHNIQUE: Multidetector CT imaging of the chest was performed using the standard protocol during bolus administration of intravenous contrast. Multiplanar CT image reconstructions and MIPs were obtained to evaluate the vascular anatomy. RADIATION DOSE REDUCTION: This exam was performed according to the departmental dose-optimization program which includes automated exposure control, adjustment of the mA and/or kV  according to patient size and/or use of iterative reconstruction technique. CONTRAST:  75mL OMNIPAQUE IOHEXOL 350 MG/ML SOLN COMPARISON:  CT angiogram chest 09/20/2022 FINDINGS: Cardiovascular: There is adequate opacification of the pulmonary arteries to the segmental level. There are bilateral lower lobar, segmental and subsegmental pulmonary emboli. There also segmental and subsegmental pulmonary emboli in the bilateral upper lobes. Overall appearance is similar to prior the heart is mildly enlarged there is a small pericardial effusion, unchanged. Aorta is normal in size. Mediastinum/Nodes: Right hilar lymphadenopathy has increased now measuring 2.7 x 2.3 cm (previously 2.4 x 2.1 cm). Subcarinal lymphadenopathy is increased now measuring 14 mm short axis (previously 11 mm). There is increasing soft tissue density in the anterior mediastinum. The esophagus and visualized thyroid gland are within normal limits. Lungs/Pleura: Right middle lobe consolidation is unchanged. There are new patchy and nodular areas of airspace and ground-glass opacities throughout the right upper lobe worrisome for infection/inflammation. Previously identified cystic/cavitary lesions are again seen predominantly in the right upper lobe. Largest measures up to 3.3 cm and has minimally decreased in size. There is no pneumothorax. Trachea and central airways are patent. No pleural effusion. Upper Abdomen: No acute abnormality. Musculoskeletal: There is a healed right clavicular fracture. No acute fractures are seen. Review of the MIP images confirms the above findings. IMPRESSION: 1. Unchanged bilateral lower lobar, segmental and subsegmental pulmonary emboli as well as segmental and subsegmental pulmonary emboli in the upper lobes. 2. New patchy and nodular areas of airspace and ground-glass opacities throughout the right upper lobe worrisome for infection/inflammation. 3. Unchanged right middle lobe consolidation. 4. Cystic/cavitary  lesions in the right upper lobe are stable to slightly decreased in size. 5. Increasing right hilar and subcarinal lymphadenopathy. 6. Increasing soft tissue density in the anterior mediastinum. This may represent thymic hyperplasia, but other etiologies are not excluded. 7. Stable cardiomegaly and small pericardial effusion. Electronically Signed   By: Darliss Cheney M.D.   On: 09/25/2022 15:20   DG Chest 2 View  Result Date: 09/25/2022 CLINICAL DATA:  Shortness of breath EXAM: CHEST - 2 VIEW COMPARISON:  Chest radiograph 08/30/2022 FINDINGS: Stable cardiomediastinal contours. There are heterogeneous opacities throughout the right lung. There is a cavitary lesion in the mid lung. The left lung appears clear. No pneumothorax or pleural effusion. No acute finding in the visualized skeleton. IMPRESSION: Nonspecific heterogeneous opacities throughout the right lung with at least 1 cavitary lesion. Electronically Signed   By: Emmaline Kluver M.D.   On: 09/25/2022 13:25   CT Angio Chest PE W and/or Wo Contrast  Result Date: 09/20/2022 CLINICAL DATA:  Concern for pulmonary embolism. EXAM: CT ANGIOGRAPHY CHEST WITH CONTRAST TECHNIQUE:  Multidetector CT imaging of the chest was performed using the standard protocol during bolus administration of intravenous contrast. Multiplanar CT image reconstructions and MIPs were obtained to evaluate the vascular anatomy. RADIATION DOSE REDUCTION: This exam was performed according to the departmental dose-optimization program which includes automated exposure control, adjustment of the mA and/or kV according to patient size and/or use of iterative reconstruction technique. CONTRAST:  75mL OMNIPAQUE IOHEXOL 350 MG/ML SOLN COMPARISON:  Chest CT dated 08/30/2022. FINDINGS: Cardiovascular: Top-normal cardiac size. Small pericardial effusion measuring 5 mm in thickness anterior to the heart. There is dilatation of the right heart chambers. Retrograde flow of contrast from the right  atrium into the IVC suggestive of right heart dysfunction. The thoracic aorta is unremarkable. There is dilatation of the main pulmonary trunk suggestive of pulmonary hypertension. Relatively similar appearance or minimally improved bilateral pulmonary emboli compared to the CT of 08/30/2022. There is near occlusive embolus involving the right middle lobe branch. The RV/LV ratio is 1.6 in keeping with right heart straining. Mediastinum/Nodes: Right hilar adenopathy. The esophagus is grossly unremarkable. No mediastinal fluid collection. Lungs/Pleura: Large area of consolidation in the right middle lobe slightly decreased in size since the prior CT. Additional scattered ground-glass and nodular densities as seen previously. Additional cavitary lesions bilaterally similar to prior CT. Probable trace bilateral pleural effusions. No pneumothorax. The central airways are patent. Upper Abdomen: No acute abnormality. Musculoskeletal: No chest wall abnormality. No acute or significant osseous findings. Review of the MIP images confirms the above findings. IMPRESSION: 1. Relatively similar appearance or minimally improved bilateral pulmonary emboli compared to the CT of 08/30/2022. There is near occlusive embolus involving the right middle lobe branch. There is evidence of right heart straining. 2. Large area of consolidation in the right middle lobe slightly decreased in size since the prior CT. Additional scattered ground-glass and nodular densities as well as cavitary lesions bilaterally similar to prior CT. 3. Probable trace bilateral pleural effusions. These results will be called to the ordering clinician or representative by the Radiologist Assistant, and communication documented in the PACS or [employer]. Electronically Signed   By: Elgie Collard M.D.   On: 09/20/2022 18:05    Microbiology: Recent Results (from the past 240 hour(s))  SARS Coronavirus 2 by RT PCR (hospital order, performed in [facility]  hospital lab) *cepheid single result test* Anterior Nasal Swab     Status: None   Collection Time: 10/12/22 11:44 AM   Specimen: Anterior Nasal Swab  Result Value Ref Range Status   SARS Coronavirus 2 by RT PCR NEGATIVE NEGATIVE Final    Comment: (NOTE) SARS-CoV-2 target nucleic acids are NOT DETECTED.  The SARS-CoV-2 RNA is generally detectable in upper and lower respiratory specimens during the acute phase of infection. The lowest concentration of SARS-CoV-2 viral copies this assay can detect is 250 copies / mL. A negative result does not preclude SARS-CoV-2 infection and should not be used as the sole basis for treatment or other patient management decisions.  A negative result may occur with improper specimen collection / handling, submission of specimen other than nasopharyngeal swab, presence of viral mutation(s) within the areas targeted by this assay, and inadequate number of viral copies (<250 copies / mL). A negative result must be combined with clinical observations, patient history, and epidemiological information.  Fact Sheet for Patients:   RoadLapTop.co.za  Fact Sheet for Healthcare Providers: http://kim-miller.com/  This test is not yet approved or  cleared by the [place] FDA and has  been authorized for detection and/or diagnosis of SARS-CoV-2 by FDA under an Emergency Use Authorization (EUA).  This EUA will remain in effect (meaning this test can be used) for the duration of the COVID-19 declaration under Section 564(b)(1) of the Act, 21 U.S.C. section 360bbb-3(b)(1), unless the authorization is terminated or revoked sooner.  Performed at [facility], [address], [place], [place] [address]   Urine Culture     Status: Abnormal   Collection Time: 10/13/22 11:49 PM   Specimen: Urine, Clean Catch  Result Value Ref Range Status   Specimen Description   Final    URINE, CLEAN CATCH Performed at  [facility], [address], [place], [place] [address]    Special Requests   Final    NONE Performed at [facility], [address] [place], [place] [address]    Culture (A)  Final    <10,000 COLONIES/mL INSIGNIFICANT GROWTH Performed at [facility], [address], [place], [place] [address]    Report Status 10/15/2022 FINAL  Final  Resp panel by RT-PCR (RSV, Flu A&B, Covid) Anterior Nasal Swab     Status: None   Collection Time: 10/13/22 11:51 PM   Specimen: Anterior Nasal Swab  Result Value Ref Range Status   SARS Coronavirus 2 by RT PCR NEGATIVE NEGATIVE Final    Comment: (NOTE) SARS-CoV-2 target nucleic acids are NOT DETECTED.  The SARS-CoV-2 RNA is generally detectable in upper respiratory specimens during the acute phase of infection. The lowest concentration of SARS-CoV-2 viral copies this assay can detect is 138 copies/mL. A negative result does not preclude SARS-Cov-2 infection and should not be used as the sole basis for treatment or other patient management decisions. A negative result may occur with  improper specimen collection/handling, submission of specimen other than nasopharyngeal swab, presence of viral mutation(s) within the areas targeted by this assay, and inadequate number of viral copies(<138 copies/mL). A negative result must be combined with clinical observations, patient history, and epidemiological information. The expected result is Negative.  Fact Sheet for Patients:  BloggerCourse.com  Fact Sheet for Healthcare Providers:  SeriousBroker.it  This test is no t yet approved or cleared by the [place] FDA and  has been authorized for detection and/or diagnosis of SARS-CoV-2 by FDA under an Emergency Use Authorization (EUA). This EUA will remain  in effect (meaning this test can be used) for the duration of the COVID-19 declaration under Section 564(b)(1) of the  Act, 21 U.S.C.section 360bbb-3(b)(1), unless the authorization is terminated  or revoked sooner.       Influenza A by PCR NEGATIVE NEGATIVE Final   Influenza B by PCR NEGATIVE NEGATIVE Final    Comment: (NOTE) The Xpert Xpress SARS-CoV-2/FLU/RSV plus assay is intended as an aid in the diagnosis of influenza from Nasopharyngeal swab specimens and should not be used as a sole basis for treatment. Nasal washings and aspirates are unacceptable for Xpert Xpress SARS-CoV-2/FLU/RSV testing.  Fact Sheet for Patients: BloggerCourse.com  Fact Sheet for Healthcare Providers: SeriousBroker.it  This test is not yet approved or cleared by the [place] FDA and has been authorized for detection and/or diagnosis of SARS-CoV-2 by FDA under an Emergency Use Authorization (EUA). This EUA will remain in effect (meaning this test can be used) for the duration of the COVID-19 declaration under Section 564(b)(1) of the Act, 21 U.S.C. section 360bbb-3(b)(1), unless the authorization is terminated or revoked.     Resp Syncytial Virus by PCR NEGATIVE NEGATIVE  Final    Comment: (NOTE) Fact Sheet for Patients: BloggerCourse.com  Fact Sheet for Healthcare Providers: SeriousBroker.it  This test is not yet approved or cleared by the [place] FDA and has been authorized for detection and/or diagnosis of SARS-CoV-2 by FDA under an Emergency Use Authorization (EUA). This EUA will remain in effect (meaning this test can be used) for the duration of the COVID-19 declaration under Section 564(b)(1) of the Act, 21 U.S.C. section 360bbb-3(b)(1), unless the authorization is terminated or revoked.  Performed at [facility], [address] [place], [place] [address]   Blood culture (routine x 2)     Status: None (Preliminary result)   Collection Time: 10/14/22  2:47 AM   Specimen: BLOOD   Result Value Ref Range Status   Specimen Description BLOOD BLOOD RIGHT ARM  Final   Special Requests   Final    BOTTLES DRAWN AEROBIC AND ANAEROBIC Blood Culture adequate volume   Culture   Final    NO GROWTH 4 DAYS Performed at [facility], [address], [place], [place] [address]    Report Status PENDING  Incomplete  Blood culture (routine x 2)     Status: None (Preliminary result)   Collection Time: 10/14/22  2:47 AM   Specimen: BLOOD  Result Value Ref Range Status   Specimen Description BLOOD BLOOD RIGHT ARM  Final   Special Requests   Final    BOTTLES DRAWN AEROBIC AND ANAEROBIC Blood Culture adequate volume   Culture   Final    NO GROWTH 4 DAYS Performed at [facility], [address] [place], [place] [address]    Report Status PENDING  Incomplete  MRSA Next Gen by PCR, Nasal     Status: None   Collection Time: 10/14/22  4:34 AM   Specimen: Nasal Mucosa; Nasal Swab  Result Value Ref Range Status   MRSA by PCR Next Gen NOT DETECTED NOT DETECTED Final    Comment: (NOTE) The GeneXpert MRSA Assay (FDA approved for NASAL specimens only), is one component of a comprehensive MRSA colonization surveillance program. It is not intended to diagnose MRSA infection nor to guide or monitor treatment for MRSA infections. Test performance is not FDA approved in patients less than 46 years old. Performed at [facility]: [facility], [address] [place], [place] [address]   Respiratory (~20 pathogens) panel by PCR     Status: None   Collection Time: 10/15/22 11:53 AM   Specimen: Nasopharyngeal Swab; Respiratory  Result Value Ref Range Status   Adenovirus NOT DETECTED NOT DETECTED Final   Coronavirus 229E NOT DETECTED NOT DETECTED Final    Comment: (NOTE) The Coronavirus on the Respiratory Panel, DOES NOT test for the novel  Coronavirus (2019 nCoV)    Coronavirus HKU1 NOT DETECTED NOT DETECTED Final   Coronavirus NL63 NOT DETECTED NOT DETECTED Final    Coronavirus OC43 NOT DETECTED NOT DETECTED Final   Metapneumovirus NOT DETECTED NOT DETECTED Final   Rhinovirus / Enterovirus NOT DETECTED NOT DETECTED Final   Influenza A NOT DETECTED NOT DETECTED Final   Influenza B NOT DETECTED NOT DETECTED Final   Parainfluenza Virus 1 NOT DETECTED NOT DETECTED Final   Parainfluenza Virus 2 NOT DETECTED NOT DETECTED Final   Parainfluenza Virus 3 NOT DETECTED NOT DETECTED Final   Parainfluenza Virus 4 NOT DETECTED NOT DETECTED Final   Respiratory Syncytial Virus NOT DETECTED NOT DETECTED Final   Bordetella pertussis NOT DETECTED NOT DETECTED Final   Bordetella  Parapertussis NOT DETECTED NOT DETECTED Final   Chlamydophila pneumoniae NOT DETECTED NOT DETECTED Final   Mycoplasma pneumoniae NOT DETECTED NOT DETECTED Final    Comment: Performed at [facility], [address], [place], [place] [address]     Labs: CBC: Recent Labs  Lab 10/13/22 2314 10/14/22 0457 10/15/22 0256 10/16/22 0604 10/17/22 0449 10/18/22 0547  WBC 19.0* 18.4* 23.9* 20.8* 21.0* 18.5*  NEUTROABS 12.8*  --   --   --   --   --   HGB 13.0 12.6 11.1* 11.0* 11.3* 10.8*  HCT 40.6 40.3 34.5* 34.7* 35.3* 34.3*  MCV 84.4 85.2 83.7 84.4 84.2 85.8  PLT 349 330 321 335 354 342   Basic Metabolic Panel: Recent Labs  Lab 10/14/22 0320 10/15/22 0256 10/16/22 0604 10/17/22 0449 10/18/22 0547  NA 132* 132* 136 137 133*  K 4.0 3.7 3.6 3.7 4.4  CL 103 107 108 108 108  CO2 19* 18* 21* 22 20*  GLUCOSE 229* 226* 141* 86 433*  BUN 30* 46* 48* 50* 47*  CREATININE 1.22* 1.32* 1.25* 1.19* 1.35*  CALCIUM 6.8* 7.4* 7.8* 7.9* 7.8*  MG 1.4* 2.2 2.0 1.7 1.8  PHOS 4.0 4.2 2.9 3.3 3.0   Liver Function Tests: Recent Labs  Lab 10/12/22 0847 10/13/22 2314  AST 18 18  ALT 11 12  ALKPHOS 113 115  BILITOT 0.4 0.6  PROT 4.2* 4.7*  ALBUMIN <1.5* <1.5*   Recent Labs  Lab 10/13/22 2314  LIPASE 27   No results for input(s): "AMMONIA" in the last 168 hours. Cardiac Enzymes: No  results for input(s): "CKTOTAL", "CKMB", "CKMBINDEX", "TROPONINI" in the last 168 hours. BNP (last 3 results) Recent Labs    09/25/22 1158 10/12/22 0847 10/13/22 2314  BNP 645.3* 966.2* 1,182.8*   CBG: Recent Labs  Lab 10/17/22 1553 10/17/22 2051 10/18/22 0815 10/18/22 1200 10/18/22 1537  GLUCAP 337* 363* 326* 266* 254*    Time spent: 35 minutes  Signed:  Gillis Santa  [facility] 10/18/2022 5:14 PM

## 2022-10-18 NOTE — Plan of Care (Signed)

## 2022-10-18 NOTE — Progress Notes (Signed)
[facility] Progress Note  Patient: Catherine [name]    ZOX:[id number]  DOA: 10/13/2022     Date of Service: the patient was seen and examined on 10/18/2022  Chief Complaint  Patient presents with   Dizziness   Brief hospital course: 22 y.o female with significant multiple PMH of uncontrolled TIDM, CKD stage IIIa with minimal-change disease and nephrotic syndrome, secondary hyperparathyroidism, hypoalbuminemia, HLD, HTN, polysubstance abuse (tobacco abuse, marijuana use), recent pulmonary embolism with right heart strain and multiple pulmonary infarcts, and cavitary lesions who presented to the ED  on 10/13/22 with chief complaints of hemoptysis and dizziness.   On review of her chart, patient has had multiple admissions and ED visit for multiple medical issues.  From 06/30/2022 to 07/05/2022 patient was admitted at [facility] due to AKI on CKD, proteinuria, hypoalbuminemia.  Ultrasound DVT was negative patient started on Eliquis for prophylactic anticoagulation due to concerns for hypercoagulability secondary to hypoalbuminemia/nephrotic syndrome.  Patient apparently ran out of Eliquis and was admitted between 08/22/2022 to 08/25/2022 with acute submassive PE with right heart strain as well as cavitary lesions.  Patient previously received rituximab with QuantiFERON gold performed on 07/05/2022 which was negative for.  She was also started on Bactrim for prophylaxis.  Patient underwent thrombolysis and mechanical thrombectomy on 08/22/2022.  On 08/30/2022, patient returned to ED with shortness of breath given repeat CT chest angiogram showed interval development of consolidation and airspace disease and bulky bilateral pulmonary embolism with occlusion in the right middle lobe suggesting infarct she was restarted on heparin drip and was transitioned to Lovenox 1 mg per cake twice daily bridging to Coumadin Coumadin with INR goal of 2-3.  Patient was seen by ID for pulmonary infiltrates/cavitary lesions and  pulmonologist who did not recommend bronchoscopy at that time due to high risk. Patient followed up with pulmonologist on 09/12/2022 who recommended CT chest high-resolution for further evaluation.  Patient was seen in the ED again on 09/20/2022, 09/25/2022 and 10/12/22 with hemoptysis and hyperglycemia and lower extremity edema.   ED Course: Initial vital signs showed HR of 86 beats/minute, BP 94/53 mm Hg, the RR 22 breaths/minute, and the oxygen saturation 100% on RA  and a temperature of 98.12F (36.7C).    Pertinent Labs/Diagnostics Findings: Na+/ K+: 133/3.4 glucose: 214 BUN/Cr.:  34/1.29 albumin < 1.5 WBC:19.0 Hgb/Hct: 13.0/40.6  PCT: negative <0.10 Lactic acid:1.6 COVID PCR: Negative, Troponin: 37 BNP: 1182.8 CXR> right pleural effusion and ill-defined airspace disease throughout the right lung   Patient given 1.5L of fluids and started on broad-spectrum abx for suspected sepsis due to elevated white count and abnormal chest x-ray.  Repeat chest x-ray showed worsening diffuse patchy airspace opacity.  Patient received IV Lasix for possible volume overload.  Patient remained hypotensive and therefore was started on Levophed. PCCM consulted.  Patient was admitted to the ICU on 4/13, Levophed weaned off, currently vital stable.  Transferred to [facility] hospitalist service on 10/15/2022.  Nephrology is following, ID will be consulted, further management as below.   Assessment and Plan: #Sepsis due to multifocal pneumonia Prior CT showed cavitary lesions of upper lobes right greater than left.QuantiFERON gold performed on 07/05/2022 negative 4/13 CT chest: Bilateral pneumonia, right middle lobe masslike lesion could be septic emboli, atypical infection or vasculitis. Small bilateral pleural effusion Blood culture NGTD, strep pneumo antigen negative, Legionella negative S/p Levophed in the ICU weaned off, vital signs stable now S/p 2 L of NS/LR & Cefepime/ Vancomycin/ Azithromycin Monitor WBC/ fever  curve continue  cefepime 2 g IV every 12 hourly TTE shows LVEF 60 to 65%, severely enlarged right ventricle, severely elevated pulmonary hypertension, severe tricuspid valve regurgitation, moderate pulmonary valve regurgitation, Small pericardial effusion, no cardiac tamponade ID consulted, recommended lung biopsy and discussion with pulmonologist Bronchoscopy cannot be done in the setting due to right ventricular failure, severe pulm hypertension and tricuspid regurgitation.  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 Aubery Lapping pulmonologist for management of CTEPH   #Pulmonary Embolism with recurrent Hemoptysis S/p thrombolysis and thrombectomy 08/22/22 recent CT on 4/11 re demonstrated near close if PE of the right lung with elevated RV to LV ratio and new diffuse groundglass opacities and small bilateral pleural effusion, and multiple cavitary lesions -Supplemental oxygen as needed, to maintain SpO2 > 90% -INR 1.5, was taking Lovenox and Coumadin due to failure on Eliquis  -continue Heparin gtt, transition to Coumadin when stable -PRN Bronchodilators  -s/p Solu-Medrol 40 mg for 3 days, started tapering prednisone from 4/17 -Smoking Cessation counseling      #AKI on CKD I Proteinuria I Hx of Nephrotic Syndrome w/ Minimal Change Disease  Baseline creatinine 0.64, Cr on admission 1.29 slighty improved from 1.48 on 09/05/22 potential pre-renal AKI in the setting of  hypotension From chart review it appears that she has received Cytoxan, CellCept, and Rituximab in the past.  -Hold home tacrolimus, check tac trough is pending -s/p 1 L LR in ED, takes Torsemide PRN at home -Prior Renal ultrasound on w/ mild bilateral renal pelviectasis and caliectasis -On Prednisone at home. Has required steroid tapers in the past (self-tapers by checking urine dipstick at home),  -Daily renal function panel and Mg  -Monitor strict I&O -Daily weights -Avoid nephrotoxic agents  -Nephrology consult 4/13 started Lasix 20 mg IV daily 4/17 lasix 40 mg IV x 1 dose given by cardio  # Poorly Controlled Type 1 DM:  Diagnosed at age 72. Complicated by minimal change disease on tacrolimus and PRN prednisone for flares  Last A1c 9.1. Follows with [facility].  4/16 increased Semglee 20 units nightly, NovoLog 5 units 3 times daily and NovoLog sliding scale, continue diabetic diet, monitor CBG. ( home dose Lantus 30 units nightly??)   Vitamin D deficiency: started vitamin D 50,000 units p.o. weekly, follow with PCP to repeat vitamin D level after 3 to 6 months.  Chronic Problems HTN: not currently on medication at home  HLD: no longer taking Atorvastatin    Body mass index is 28.01 kg/m.  Interventions:       Diet: Carb modified diet DVT Prophylaxis: Therapeutic Anticoagulation with heparin IV infusion    Advance goals of care discussion: Full code  Family Communication: family was not present at bedside, at the time of interview.  The pt provided permission to discuss medical plan with the family. Opportunity was given to ask question and all questions were answered satisfactorily.   Disposition:  Pt is from Home, admitted with sepsis, PNA, still on Hep IV infusion, may need lung biopsy, and  right heart cath, s/p IV antibiotics, which precludes a safe discharge. 4/17 patient got accepted at [facility] so will be transferred there for bed will be  available.   Subjective: No significant events overnight, patient was feeling worsening of shortness of breath, patient was placed on oxygen since last night.  Denies any chest pain or palpitation, no any other active issues.   Physical Exam: General: NAD, lying comfortably, mild shortness of breath Appear in no distress, affect appropriate Eyes: PERRLA ENT: Oral Mucosa Clear, moist  Neck: no JVD,  Cardiovascular: S1 and S2 Present, no Murmur,  Respiratory: Good air entry bilaterally, tachypneic, bilateral crackles, no significant wheezing appreciated Abdomen: Bowel Sound present, Soft and no tenderness,  Skin: no rashes Extremities: 1-2+ Pedal edema, no calf tenderness Neurologic: without any new focal findings Gait not checked due to patient safety concerns  Vitals:   10/18/22 0735 10/18/22 0752 10/18/22 1158 10/18/22 1534  BP:  107/73 105/70 100/61  Pulse:  81 81 85  Resp:  Temp:  97.7 F (36.5 C) 98 F (36.7 C) 97.8 F (36.6 C)  TempSrc:    Oral  SpO2:  99% 100% 100%  Weight: 62.6 kg     Height:        Intake/Output Summary (Last 24 hours) at 10/18/2022 1702 Last data filed at 10/18/2022 1044 Gross per 24 hour  Intake 152.81 ml  Output --  Net 152.81 ml   Filed Weights   10/16/22 0500 10/17/22 0441 10/18/22 0735  Weight: 64.9 kg 63.1 kg 62.6 kg    Data Reviewed: I have personally reviewed and interpreted daily labs, tele strips, imagings as discussed above. I reviewed all nursing notes, pharmacy notes, vitals, pertinent old records I have discussed plan of care as described above with RN and patient/family.  CBC: Recent Labs  Lab 10/13/22 2314 10/14/22 0457 10/15/22 0256 10/16/22 0604 10/17/22 0449 10/18/22 0547  WBC 19.0* 18.4* 23.9* 20.8* 21.0* 18.5*  NEUTROABS 12.8*  --   --   --   --   --   HGB 13.0 12.6 11.1* 11.0* 11.3* 10.8*  HCT 40.6 40.3 34.5* 34.7* 35.3* 34.3*  MCV 84.4 85.2 83.7 84.4 84.2 85.8  PLT 349 330 321 335 354 342    Basic Metabolic Panel: Recent Labs  Lab 10/14/22 0320 10/15/22 0256 10/16/22 0604 10/17/22 0449 10/18/22 0547  NA 132* 132* 136 137 133*  K 4.0 3.7 3.6 3.7 4.4  CL 103 107 108 108 108  CO2 19* 18* 21* 22 20*  GLUCOSE 229* 226* 141* 86 433*  BUN 30* 46* 48* 50* 47*  CREATININE 1.22* 1.32* 1.25* 1.19* 1.35*  CALCIUM 6.8* 7.4* 7.8* 7.9* 7.8*  MG 1.4* 2.2 2.0 1.7 1.8  PHOS 4.0 4.2 2.9 3.3 3.0    Studies: DG Chest Port 1 View  Result Date: 10/18/2022 CLINICAL DATA:  Shortness of breath EXAM: PORTABLE CHEST 1 VIEW COMPARISON:  CXR 10/14/22, CT chest 10/14/22 FINDINGS: Small bilateral pleural effusions. No pneumothorax. Redemonstrated are extensive bilateral patchy airspace opacities which appear similar to prior exam. Unchanged cardiac and mediastinal contours. No radiographically apparent displaced rib fractures. Visualized upper abdomen is unremarkable chronic right clavicular fracture. IMPRESSION: 1. Small bilateral pleural effusions. 2. Extensive bilateral patchy airspace opacities which appear similar to prior exam and are better characterized on prior chest CT. Electronically Signed   By: Lorenza Cambridge M.D.   On: 10/18/2022 10:44    Scheduled Meds:  Chlorhexidine Gluconate Cloth  6 each  Topical Daily   furosemide  40 mg Intravenous Once   guaiFENesin  600 mg Oral BID   insulin aspart  0-5 Units Subcutaneous QHS   insulin aspart  0-9 Units Subcutaneous TID WC   insulin aspart  5 Units Subcutaneous TID WC   insulin glargine-yfgn  28 Units Subcutaneous QHS   predniSONE  30 mg Oral Q breakfast   Followed by   Melene Muller ON 10/21/2022] predniSONE  20 mg Oral Q breakfast   Followed by   Melene Muller ON 10/24/2022] predniSONE  10 mg Oral Q breakfast   sodium chloride flush  3 mL Intravenous Q12H   Vitamin D (Ergocalciferol)  50,000 Units Oral Q7 days   Continuous Infusions:  sodium chloride Stopped (10/17/22 1018)   heparin 1,150 Units/hr (10/18/22 0220)   PRN Meds: acetaminophen,  benzonatate, chlorpheniramine-HYDROcodone, docusate sodium, ibuprofen, ipratropium-albuterol, menthol-cetylpyridinium, ondansetron (ZOFRAN) IV, mouth rinse, polyethylene glycol  Time spent: 55 minutes  Author: Gillis Santa. MD [facility] 10/18/2022 5:02 PM  To reach On-call, see care teams to locate the attending and reach out to them via [email or web address]. If 7PM-7AM, please contact night-coverage If you still have difficulty reaching the attending provider, please page the [facility] (Director on Call) for [facility] on amion for assistance.

## 2022-10-18 NOTE — Progress Notes (Signed)
ANTICOAGULATION CONSULT NOTE   Pharmacy Consult for Heparin  Indication: pulmonary embolus  No Known Allergies  Patient Measurements: Height:  (149.9 cm) Weight: 63.1 kg (139 lb 1.8 oz) IBW/kg (Calculated) : 43.2 Heparin Dosing Weight: 55.9 kg   Vital Signs: Temp: 98.2 F (36.8 C) (04/17 0402) Temp Source: Oral (04/16 2300) BP: 107/67 (04/17 0402) Pulse Rate: 91 (04/17 0402)  Labs: Recent Labs    10/16/22 0604 10/17/22 0449 10/18/22 0547  HGB 11.0* 11.3* 10.8*  HCT 34.7* 35.3* 34.3*  PLT 335 354 342  HEPARINUNFRC 0.33 0.47 0.47  CREATININE 1.25* 1.19*  --      Estimated Creatinine Clearance: 59.9 mL/min (A) (by C-G formula based on SCr of 1.19 mg/dL (H)).   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 20 tablet 0 prn   benzonatate (TESSALON PERLES) 100 MG capsule Take 1 capsule (100 mg total) by mouth 3 (three) times daily as needed for cough. 30 capsule 1 prn   calcitRIOL (ROCALTROL) 0.25 MCG capsule Take 0.25 mcg by mouth as directed. Take 1 capsule (0.25 mcg) three times a week.   10/11/2022   chlorpheniramine-HYDROcodone (TUSSIONEX) 10-8 MG/5ML Take 5 mLs by mouth every 12 (twelve) hours as needed for cough. 115 mL 0 prm   glucagon 1 MG injection Inject 1 mg into the muscle as needed.   prn   insulin aspart (NOVOLOG) 100 UNIT/ML injection Inject 5-8 Units into the skin 3 (three) times daily before meals. 7.2 mL 2 10/13/2022   ketorolac (TORADOL) 10 MG tablet Take 1 tablet (10 mg total) by mouth every 6 (six) hours as needed. 20 tablet 0  prn   LANTUS 100 UNIT/ML injection Inject 0.15 mLs (15 Units total) into the skin at bedtime. 4.5 mL 2 10/13/2022   Multiple Vitamin (MULTIVITAMIN WITH MINERALS) TABS tablet Take 1 tablet by mouth daily.   10/13/2022   [EXPIRED] oxyCODONE (ROXICODONE) 5 MG immediate release tablet Take 1 tablet (5 mg total) by mouth every 8 (eight) hours as needed for up to 2 days. 6 tablet 0 prn   tacrolimus (PROGRAF) 1 MG capsule Take 2 capsules (2 mg total) by mouth 2 (two) times daily. 120 capsule 2 10/13/2022   torsemide (DEMADEX) 20 MG tablet Take 1 tablet (20 mg total) by mouth daily. 30 tablet 0 10/13/2022   Vitamin D, Ergocalciferol, (DRISDOL) 1.25 MG (50000 UNIT) CAPS capsule Take 50,000 Units by mouth every 7 (seven) days.   Past Week   warfarin (COUMADIN) 5 MG tablet Take 1 tablet (5 mg total) by mouth daily. 30 tablet 2 10/13/2022 at 1900   blood glucose meter kit and supplies Dispense based on patient and insurance preference. Use up to four times daily as directed. (FOR ICD-10 E10.9, E11.9). 1 each 0    enoxaparin (LOVENOX) 60 MG/0.6ML injection Inject 0.575 mLs (57.5 mg total) into the skin every 12 (twelve) hours. (Patient not taking: Reported on 10/14/2022) 14 mL 0 Not Taking   feeding supplement, GLUCERNA SHAKE, (GLUCERNA SHAKE) LIQD Take 237 mLs by mouth  2 (two) times daily between meals.  0    sulfamethoxazole-trimethoprim (BACTRIM DS) 800-160 MG tablet Take 1 tablet by mouth every Monday, Wednesday, and Friday. (Patient not taking: Reported on 10/14/2022) 12 tablet 2 Completed Course   torsemide (DEMADEX) 20 MG tablet Take 1 tablet (20 mg total) by mouth daily. 30 tablet 0     Assessment: Pharmacy consulted to dose heparin for PE in this 22 year old female admitted with sepsis.  Per PTA med list, pt was on lovenox 57.5 mg SQ Q12H at some point but was no longer taking.  Pt was on Warfarin 5 mg PO daily,  last dose on 4/12 @ 1900.   4/13 1346 HL 0.14  4/13 2117 HL 0.25 4/14 0256 HL 0.28 4/14 0948 HL  0.39 4/14 1548 HL 0.31 4/15 0604 HL 0.33, therapeutic X 3 4/16 0449 HL 0.47, therapeutic x 4 4/17 0547 HL 0.47, therapeutic x 5   Goal of Therapy:  Heparin level 0.3-0.7 units/ml Monitor platelets by anticoagulation protocol: Yes   Plan: Heparin level is therapeutic X 5  Will continue heparin infusion at 1150 units/hr. Next heparin level tomorrow AM CBC daily while on heparin.   Otelia Sergeant, PharmD, [facility] 10/18/2022 6:31 AM

## 2022-10-19 ENCOUNTER — Ambulatory Visit: Payer: Medicaid Other | Admitting: Family Medicine

## 2022-10-19 LAB — IGG 4: IgG, Subclass 4: 9 mg/dL (ref 2–96)

## 2022-10-19 LAB — RHEUMATOID FACTOR: Rheumatoid fact SerPl-aCnc: <10 IU/mL (ref <14.0)

## 2022-10-19 LAB — CULTURE, BLOOD (ROUTINE X 2)

## 2022-10-19 SURGERY — RIGHT HEART CATH
Anesthesia: Moderate Sedation

## 2022-10-20 LAB — FUNGITELL BETA-D-GLUCAN
Fungitell Value:: <31.25 pg/mL
Result Name:: NEGATIVE

## 2022-10-20 LAB — MISC LABCORP TEST (SEND OUT): Labcorp test code: 164284

## 2022-10-22 LAB — IMMUNOGLOBULINS A/E/G/M, SERUM
IgA: 187 mg/dL (ref 87–352)
IgE (Immunoglobulin E), Serum: 58 IU/mL (ref 6–495)
IgG (Immunoglobin G), Serum: 343 mg/dL — ABNORMAL LOW (ref 586–1602)
IgM (Immunoglobulin M), Srm: 173 mg/dL (ref 26–217)

## 2022-10-24 ENCOUNTER — Ambulatory Visit: Payer: Medicaid Other | Admitting: Student in an Organized Health Care Education/Training Program

## 2022-11-07 ENCOUNTER — Inpatient Hospital Stay: Payer: Medicaid Other

## 2022-11-13 ENCOUNTER — Ambulatory Visit: Payer: Medicaid Other | Admitting: Family Medicine

## 2022-11-17 ENCOUNTER — Encounter: Payer: Self-pay | Admitting: Student in an Organized Health Care Education/Training Program

## 2022-11-17 ENCOUNTER — Ambulatory Visit (INDEPENDENT_AMBULATORY_CARE_PROVIDER_SITE_OTHER): Payer: Medicaid Other | Admitting: Student in an Organized Health Care Education/Training Program

## 2022-11-17 VITALS — BP 120/78 | HR 73 | Temp 97.8°F | Ht 59.0 in | Wt 118.0 lb

## 2022-11-17 DIAGNOSIS — I2609 Other pulmonary embolism with acute cor pulmonale: Secondary | ICD-10-CM

## 2022-11-17 DIAGNOSIS — J9611 Chronic respiratory failure with hypoxia: Secondary | ICD-10-CM | POA: Diagnosis not present

## 2022-11-17 DIAGNOSIS — I2724 Chronic thromboembolic pulmonary hypertension: Secondary | ICD-10-CM | POA: Diagnosis not present

## 2022-11-17 DIAGNOSIS — E1021 Type 1 diabetes mellitus with diabetic nephropathy: Secondary | ICD-10-CM | POA: Diagnosis not present

## 2022-11-17 DIAGNOSIS — I2782 Chronic pulmonary embolism: Secondary | ICD-10-CM | POA: Diagnosis not present

## 2022-11-18 NOTE — Progress Notes (Signed)
Assessment & Plan:   1. CTEPH (chronic thromboembolic pulmonary hypertension) 2. 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 3. 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 Chronic hypoxic respiratory failure  Patient has a history of nephrotic syndrome secondary to minimal change disease and diabetes, complicated by venous thromboembolism and the subsequent development of CTEPH. She successfully underwent pulmonary endarterectomy on 10/26/2022 at [facility] and is presenting today for follow up. Patient also had nodularity on her chest CT that was worked up with a LLL wedge biopsy during the same surgery.  While the differential for the nodularity noted on CT was broad (infectious vs non-infectious), wedge biopsy does not show any granulomatous inflammation, vasculitis, or interstitial lung disease. The biopsy is consistent with CTEPH, with findings that are consistent with smoking related lung fibrosis. Patient has abstained from smoking or vaping for a few months now. Given all this, I am inclined to monitor the nodularity and abstain from further workup.  As for her chronic hypoxic respiratory failure secondary to CTEPH, she underwent pulmonary endarterectomy at [facility] and is scheduled for follow up in August. She will require repeat echocardiography. She is compliant with Sildenafil which she will continue.  As to her choice of anti-coagulation, I am concerned that Apixaban is not the ideal choice for her. While it is not clear if her PE was secondary to failure of apixaban or if it was secondary to non-compliance, review of literature suggests better evidence for warfarin in the setting of VTE secondary to minimal change disease. I have reached out to our hematology colleagues and to the patient's PVD team at [facility] to clarify this prior to making any changes. Dr. Brent Bulla from [facility] agrees that Warfarin is likely better for [facility] in her  situation.  -continue with oxygen via nasal cannula -switch anti-coagulation from Eliquis to Warfarin, will reach out to hematology -continue Sildenafil -follow up with PVD clinic at [facility] as previously scheduled -follow up with cardiothoracic surgery at [facility] as previously scheduled   Return in about 5 weeks (around 12/22/2022).  I spent 30 minutes caring for this patient today, including preparing to see the patient, obtaining a medical history , reviewing a separately obtained history, performing a medically appropriate examination and/or evaluation, counseling and educating the patient/family/caregiver, referring and communicating with other health care professionals (not separately reported), documenting clinical information in the electronic health record, and independently interpreting results (not separately reported/billed) and communicating results to the patient/family/caregiver  Catherine Chute, MD [facility] 11/18/2022 11:33 AM    End of visit medications:  No orders of the defined types were placed in this encounter.    Current Outpatient Medications:    acetaminophen (TYLENOL) 325 MG tablet, Take 2 tablets (650 mg total) by mouth every 6 (six) hours as needed for mild pain or headache., Disp: 20 tablet, Rfl: 0   benzonatate (TESSALON PERLES) 100 MG capsule, Take 1 capsule (100 mg total) by mouth 3 (three) times daily as needed for cough., Disp: 30 capsule, Rfl: 1   blood glucose meter kit and supplies, Dispense based on patient and insurance preference. Use up to four times daily as directed. (FOR ICD-10 E10.9, E11.9)., Disp: 1 each, Rfl: 0   calcitRIOL (ROCALTROL) 0.25 MCG capsule, Take 0.25 mcg by mouth as directed. Take 1 capsule (0.25 mcg) three times a week., Disp: , Rfl:    chlorpheniramine-HYDROcodone (TUSSIONEX) 10-8 MG/5ML, Take 5 mLs by mouth every 12 (  twelve) hours as needed for cough., Disp: 115 mL, Rfl: 0   feeding supplement, GLUCERNA SHAKE,  (GLUCERNA SHAKE) LIQD, Take 237 mLs by mouth 2 (two) times daily between meals., Disp: , Rfl: 0   glucagon 1 MG injection, Inject 1 mg into the muscle as needed., Disp: , Rfl:    heparin 16109 UT/250ML infusion, Inject 1,150 Units/hr into the vein continuous., Disp: , Rfl:    LANTUS 100 UNIT/ML injection, Inject 0.15 mLs (15 Units total) into the skin at bedtime., Disp: 4.5 mL, Rfl: 2   Multiple Vitamin (MULTIVITAMIN WITH MINERALS) TABS tablet, Take 1 tablet by mouth daily., Disp: , Rfl:    tacrolimus (PROGRAF) 1 MG capsule, Take 2 capsules (2 mg total) by mouth 2 (two) times daily., Disp: 120 capsule, Rfl: 2   Vitamin D, Ergocalciferol, (DRISDOL) 1.25 MG (50000 UNIT) CAPS capsule, Take 50,000 Units by mouth every 7 (seven) days., Disp: , Rfl:    warfarin (COUMADIN) 5 MG tablet, Take 1 tablet (5 mg total) by mouth daily., Disp: 30 tablet, Rfl: 2   enoxaparin (LOVENOX) 60 MG/0.6ML injection, Inject 0.575 mLs (57.5 mg total) into the skin every 12 (twelve) hours. (Patient not taking: Reported on 10/14/2022), Disp: 14 mL, Rfl: 0   insulin aspart (NOVOLOG) 100 UNIT/ML injection, Inject 5-8 Units into the skin 3 (three) times daily before meals., Disp: 7.2 mL, Rfl: 2   sulfamethoxazole-trimethoprim (BACTRIM DS) 800-160 MG tablet, Take 1 tablet by mouth every Monday, Wednesday, and Friday. (Patient not taking: Reported on 10/14/2022), Disp: 12 tablet, Rfl: 2   torsemide (DEMADEX) 20 MG tablet, Take 1 tablet (20 mg total) by mouth daily., Disp: 30 tablet, Rfl: 0   Subjective:   PATIENT ID: Catherine [name] GENDER: female DOB: October 22, 2000, MRN: [id number]  Chief Complaint  Patient presents with   Follow-up    HPI  Catherine [name] is a 22 year old female with a past medical history of CKD secondary to nephrotic syndrome from minimal change disease and diabetic nephropathy (secondary to T1DM) and recently diagnosed with CTEPH s/p pulmonary endarterectomy who presents to clinic for follow up.   Patient has  had multiple recent admissions secondary to pulmonary embolism.  She was admitted to [facility] over New Year's (06/30/2022 - 07/05/2022) at which point she was discharged on Eliquis for prophylaxis.  She reports running out of the Eliquis and not taking it after that.  She then presented to our hospital on 20 February with shortness of breath and diagnosed with pulmonary embolism. She was also found to have cavitary lung lesions on her chest CT. I saw her in consultation during said admission on 08/22/2022 at which point a workup for cavitary pulmonary lesions returned negative (beta D glucan, Aspergillus galactomannan, cryptococcal antigen, ANA, c-ANCA, p-ANCA, Quant gold, respiratory cultures, and blood cultures). She underwent thrombectomy and was discharged on Eliquis for the management of her PE only to represent on 08/30/2022 again with shortness of breath. Repeat CT scan also showed a right lower lobe infiltrate in addition to her cavitary lesions. She was seen in consultation and felt that the risk of procedural intervention (bronchoscopy) greatly outweigh the benefits.  She was started on broad-spectrum antibiotics and again infectious workup was unrevealing. Patient was discharged on 09/05/2022 on coumadin with improvement.   I saw her in clinic on 09/12/2022 where she reported exertional dyspnea, but overall improvement in her symptoms. She was tolerating coumadin well at the time. Patient then re-presented on 10/13/2022 secondary to hemoptysis. She  was initially admitted to the ICU and subsequently transferred to the care of the hospitalist service for further management. Patient reports coming in due to streaks of blood mixed in with sputum. She denies any chest pain or tightness, denies any fevers, chills or night sweats. She continues to have exertional dyspnea. Repeat TTE 10/15/2022 showed an enlarged RV with severely elevated PASP. She was seen in consultation by pulmonology, cardiology, and infectious  disease. Her chest CT images were also reviewed by our chest radiologist. Overall picture (nodularity on CT, elevated PASP, dilated RV) was highly concerning for CTEPH, and the patient was arranged to be transferred to [facility] for a CTEPH evaluation.  She was transferred on 10/18/2022 to [facility] where she was evaluated by their pulmonary vascular disease team as well as cardiothoracic surgery team. RHC performed on 10/20/2022 showed: RA 18, RV 106/6, PA 104/48 (66), PCWP (on the left) was 18. CO was 2.9, CI was 1.9, PVR was 16.6 woods units, SVR was 28. She then underwent pulmonary endarterectomy on 10/26/2022 - she had bronchoscopy (with BAL) and a LLL wedge biopsy performed during the same procedure. Patient recovered well from the procedure and was discharged home on 3 liters of nasal cannula. She was switched from Warfarin to Eliquis at [facility] on discharge. She was also started on Sildenafil with which she is compliant.  Repeat auto-immune workup performed at [facility] during said hospitalization was again negative, as was the infectious workup.   LLL wedge biopsy 10/26/2022  A. Lung, right superior segment; wedge resection:   - Mild to moderate pulmonary arterial hypertension with focal intraluminal organized thrombi and recanalization; features consistent with thromboembolic pulmonary hypertension.  - Patchy interstitial fibrosis with associated intraalveolar macrophages. See comment. - Granulomatous inflammation not identified.   Comment: The lung tissue demonstrates patchy interstitial fibrosis with associated intraalveolar macrophages, the histologic features raise the possibility of smocking-related interstitial fibrosis (SRIF), clinical correlation is recommended.   B. Pulmonary artery; endarterectomy:    - Fragments of degenerating and focally organized thrombus.   Pulmonology is consulted to help guide the management of the pulmonary infiltrates and ground glass noted on the chest  CT.  Ancillary information including prior medications, full medical/surgical/family/social histories, and PFTs (when available) are listed below and have been reviewed.   Review of Systems  Constitutional:  Negative for chills, fever, malaise/fatigue and weight loss.  Respiratory:  Positive for shortness of breath. Negative for cough, hemoptysis, sputum production and wheezing.   Cardiovascular:  Negative for chest pain and leg swelling.  Skin:  Negative for rash.     Objective:   Vitals:   11/17/22 1014  BP: 120/78  Pulse: 73  Temp: 97.8 F (36.6 C)  TempSrc: Temporal  SpO2: 100%  Weight: 118 lb (53.5 kg)  Height: 4\' 11"  (1.499 m)   100% on 3 L per [place]  BMI Readings from Last 3 Encounters:  11/17/22 23.83 kg/m  10/18/22 27.87 kg/m  10/12/22 24.24 kg/m   Wt Readings from Last 3 Encounters:  11/17/22 118 lb (53.5 kg)  10/18/22 138 lb (62.6 kg)  10/12/22 120 lb (54.4 kg)    Physical Exam Constitutional:      Appearance: Normal appearance.  HENT:     Head: Normocephalic.     Nose: Nose normal.     Mouth/Throat:     Mouth: Mucous membranes are moist.  Cardiovascular:     Rate and Rhythm: Normal rate and regular rhythm.     Comments: Midline sternotomy scar  healing. Pulmonary:     Effort: Pulmonary effort is normal. No respiratory distress.     Breath sounds: No wheezing, rhonchi or rales.  Abdominal:     Palpations: Abdomen is soft.  Neurological:     General: No focal deficit present.     Mental Status: She is alert and oriented to person, place, and time. Mental status is at baseline.      Ancillary Information    Past Medical History:  Diagnosis Date   Diabetes mellitus without complication (HCC)    Dyslipidemia    Hypoalbuminemia    Marijuana abuse    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  Tobacco Use   Smoking status: Former    Types: E-cigarettes    Quit date: 07/2022    Years since quitting: 0.3   Smokeless tobacco: Never   Tobacco comments:    Quit substance use since the beginning of the year   Vaping Use   Vaping Use: Former  Substance and Sexual Activity   Alcohol use: Not Currently   Drug use: Yes    Types: Marijuana    Comment: Quit using marijuana approx earlier this year   Sexual activity: Not Currently  Other Topics Concern   Not on file  Social History Narrative   Not on file   Social Determinants of Health   Financial Resource Strain: Not on file  Food Insecurity: No Food Insecurity (10/14/2022)   Hunger Vital Sign    Worried About Running Out of Food in the Last Year: Never true    Ran Out of Food in the Last Year: Never true  Transportation Needs: No Transportation Needs (10/14/2022)   PRAPARE - Administrator, Civil Service (Medical): No    Lack of Transportation (Non-Medical): No  Physical Activity: Not on file  Stress: Not on file  Social Connections: Not on file  Intimate Partner Violence: Not At Risk (10/14/2022)   Humiliation, Afraid, Rape, and Kick questionnaire    Fear of Current or Ex-Partner: No    Emotionally Abused: No    Physically Abused: No    Sexually Abused: No     No Known Allergies   CBC    Component Value Date/Time   WBC 18.5 (H) 10/18/2022 0547   RBC 4.00 10/18/2022 0547   HGB 10.8 (L) 10/18/2022 0547   HGB 12.6 09/08/2022 1047   HCT 34.3 (L) 10/18/2022 0547    PLT 342 10/18/2022 0547   PLT 221 09/08/2022 1047   MCV 85.8 10/18/2022 0547   MCH 27.0 10/18/2022 0547   MCHC 31.5 10/18/2022 0547   RDW 15.7 (H) 10/18/2022 0547   LYMPHSABS 4.2 (H) 10/13/2022 2314   MONOABS 1.6 (H) 10/13/2022 2314   EOSABS 0.1 10/13/2022 2314   BASOSABS 0.1 10/13/2022 2314    Pulmonary Functions Testing Results:  No data to display          Outpatient Medications Prior to Visit  Medication Sig Dispense Refill   acetaminophen (TYLENOL) 325 MG tablet Take 2 tablets (650 mg total) by mouth every 6 (six) hours as needed for mild pain or headache. 20 tablet 0   benzonatate (TESSALON PERLES) 100 MG capsule Take 1 capsule (100 mg total) by mouth 3 (three) times daily as needed for cough. 30 capsule 1   blood glucose meter kit and supplies Dispense based on patient and insurance preference. Use up to four times daily as directed. (FOR ICD-10 E10.9, E11.9). 1 each 0   calcitRIOL (ROCALTROL) 0.25 MCG capsule Take 0.25 mcg by mouth as directed. Take 1 capsule (0.25 mcg) three times a week.     chlorpheniramine-HYDROcodone (TUSSIONEX) 10-8 MG/5ML Take 5 mLs by mouth every 12 (twelve) hours as needed for cough. 115 mL 0   feeding supplement, GLUCERNA SHAKE, (GLUCERNA SHAKE) LIQD Take 237 mLs by mouth 2 (two) times daily between meals.  0   glucagon 1 MG injection Inject 1 mg into the muscle as needed.     heparin 02725 UT/250ML infusion Inject 1,150 Units/hr into the vein continuous.     LANTUS 100 UNIT/ML injection Inject 0.15 mLs (15 Units total) into the skin at bedtime. 4.5 mL 2   Multiple Vitamin (MULTIVITAMIN WITH MINERALS) TABS tablet Take 1 tablet by mouth daily.     tacrolimus (PROGRAF) 1 MG capsule Take 2 capsules (2 mg total) by mouth 2 (two) times daily. 120 capsule 2   Vitamin D, Ergocalciferol, (DRISDOL) 1.25 MG (50000 UNIT) CAPS capsule Take 50,000 Units by mouth every 7 (seven) days.     warfarin (COUMADIN) 5 MG tablet Take 1 tablet (5 mg total) by mouth  daily. 30 tablet 2   enoxaparin (LOVENOX) 60 MG/0.6ML injection Inject 0.575 mLs (57.5 mg total) into the skin every 12 (twelve) hours. (Patient not taking: Reported on 10/14/2022) 14 mL 0   insulin aspart (NOVOLOG) 100 UNIT/ML injection Inject 5-8 Units into the skin 3 (three) times daily before meals. 7.2 mL 2   sulfamethoxazole-trimethoprim (BACTRIM DS) 800-160 MG tablet Take 1 tablet by mouth every Monday, Wednesday, and Friday. (Patient not taking: Reported on 10/14/2022) 12 tablet 2   torsemide (DEMADEX) 20 MG tablet Take 1 tablet (20 mg total) by mouth daily. 30 tablet 0   torsemide (DEMADEX) 20 MG tablet Take 1 tablet (20 mg total) by mouth daily. 30 tablet 0   No facility-administered medications prior to visit.

## 2022-11-20 ENCOUNTER — Encounter: Payer: Self-pay | Admitting: Student in an Organized Health Care Education/Training Program

## 2022-11-20 DIAGNOSIS — I2609 Other pulmonary embolism with acute cor pulmonale: Secondary | ICD-10-CM

## 2022-11-21 ENCOUNTER — Ambulatory Visit: Payer: Medicaid Other | Admitting: Family Medicine

## 2022-11-21 ENCOUNTER — Encounter: Payer: Self-pay | Admitting: Family Medicine

## 2022-11-21 VITALS — BP 132/89 | HR 78 | Temp 97.2°F | Resp 16 | Ht 59.0 in | Wt 116.0 lb

## 2022-11-21 DIAGNOSIS — I10 Essential (primary) hypertension: Secondary | ICD-10-CM | POA: Diagnosis not present

## 2022-11-21 DIAGNOSIS — E1021 Type 1 diabetes mellitus with diabetic nephropathy: Secondary | ICD-10-CM

## 2022-11-21 DIAGNOSIS — F419 Anxiety disorder, unspecified: Secondary | ICD-10-CM

## 2022-11-21 DIAGNOSIS — Z7689 Persons encountering health services in other specified circumstances: Secondary | ICD-10-CM | POA: Diagnosis not present

## 2022-11-21 DIAGNOSIS — E1065 Type 1 diabetes mellitus with hyperglycemia: Secondary | ICD-10-CM | POA: Diagnosis not present

## 2022-11-21 DIAGNOSIS — J9601 Acute respiratory failure with hypoxia: Secondary | ICD-10-CM

## 2022-11-21 DIAGNOSIS — F5102 Adjustment insomnia: Secondary | ICD-10-CM | POA: Insufficient documentation

## 2022-11-21 MED ORDER — CLOTRIMAZOLE 1 % EX CREA: 1.0000 | TOPICAL_CREAM | Freq: Two times a day (BID) | CUTANEOUS | 0 refills | Status: AC

## 2022-11-21 MED ORDER — "BD INSULIN SYRINGE U/F 31G X 5/16"" 0.3 ML MISC": 6 refills | Status: DC

## 2022-11-21 MED ORDER — TRAZODONE HCL 50 MG PO TABS: 25.0000 mg | ORAL_TABLET | Freq: Every evening | ORAL | 0 refills | Status: DC | PRN

## 2022-11-21 MED ORDER — HYDROXYZINE PAMOATE 25 MG PO CAPS
25.0000 mg | ORAL_CAPSULE | Freq: Three times a day (TID) | ORAL | 2 refills | Status: DC | PRN
Start: 2022-11-21 — End: 2024-01-29

## 2022-11-21 NOTE — Assessment & Plan Note (Signed)
Chronic  Intermittent increased episodes of anxiety  Prescribed PRN hydroxyzine 25mg  TID

## 2022-11-21 NOTE — Progress Notes (Signed)
I,Joseline E Rosas,acting as a scribe for [employer], MD.,have documented all relevant documentation on the behalf of Ronnald Ramp, MD,as directed by  Ronnald Ramp, MD while in the presence of Ronnald Ramp, MD.  New patient visit   Patient: Catherine [name]   DOB: 05-Aug-2000   21 y.o. Female  MRN: [id number] Visit Date: 11/21/2022  Today's healthcare provider: Ronnald Ramp, MD   Chief Complaint  Patient presents with   Establish Care   Insomnia   Rash   Subjective    Catherine [name] is a 22 y.o. female who presents today as a new patient to establish care.  HPI   Encounter to Establish Care Patient presents to establish care  Introduced myself and my role as primary care physician  We reviewed patient's medical, surgical, and social history and medications as listed below    PMHX   Last annual physical: Unknown   T1DM  Medications: Lantus 24 units at bedtime ,Novolog injects unit base on sugar level (sliding scale)   Follows with endocrinology, Dr. Elliot Gurney, [facility]   CKD IIIA  Nephrotic Syndrome  Hx of Minimal Change Dz  Medications: Tacrolimus, Prednisone 20mg  daily  Followed by Dr. Thedore Mins   Chronic Pulmonary HTN with Thromboembolism  Medications: Warfarin 5mg  changed to eliquis 5 mg Patient currently on 2L of oxygen. Followed by Dr. Earnestine Mealing but is switching to [facility] with Dr. Brent Bulla  Her sats have been in the 95-97%, on 2-3L with ambulation   Reports she has been doing better since discharged from the hospital, walking and watching glucose, she is hoping to wean off of oxygen   Social Hx  Tobacco use: not currently, vaped for 3 months, denies hx of cigarette smoke  Alcohol Use : none Illicit drug use: none   Concerns for Today:   Insomnia Patient reports that she is not sleeping well Reports that taking melatonin caused her to stay awake Reports that she sleeps from 12 am to 3 am and  then gets up and goes back to sleep at 5 am and gets up at 8:30 am She has been avoiding day time naps to help with sleeping at night  She has been on trazodone at night while in the hospital   Anxiety: reports that she has been taking hydroxyzine as needed for anxiety    Rash: Patient complains of rash involving the groin. Rash started 1 week ago. Appearance of rash at onset: Color of lesion(s): brown. Rash has not changed over time Initial distribution: groin, left side.  Discomfort associated with rash: causes no discomfort.  Associated symptoms: none. Denies: abdominal pain, fever, and sore throat. Patient has not had previous evaluation of rash. Patient has not had previous treatment. .Patient has not had contacts with similar rash. Patient has not had new exposures (soaps, lotions, laundry detergents, foods, medications, plants, insects or animals.) Patient notice rash after hospital discharge.   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 No pertinent family history. 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  Tobacco Use   Smoking status: Former    Types: E-cigarettes    Quit date: 07/2022    Years since quitting: 0.3   Smokeless tobacco: Never   Tobacco comments:    Quit substance use since the beginning of the year   Vaping Use   Vaping Use: Former  Substance and Sexual Activity   Alcohol use: Not Currently   Drug use: Yes    Types: Marijuana    Comment: Quit using marijuana approx earlier this year   Sexual activity: Not Currently  Other Topics Concern   Not on file  Social History Narrative   Not on file   Social Determinants of Health   Financial Resource Strain: Not on file  Food Insecurity: No Food Insecurity (10/14/2022)   Hunger Vital Sign    Worried About Running Out of Food in the Last Year: Never true    Ran Out of Food in the Last Year: Never true  Transportation Needs: No Transportation Needs (10/14/2022)   PRAPARE - Administrator, Civil Service (Medical): No    Lack of Transportation (Non-Medical): No  Physical Activity: Not on file  Stress: Not on file  Social Connections: Not on file   Outpatient Medications Prior to Visit  Medication Sig   acetaminophen (TYLENOL) 325 MG tablet Take 2 tablets (650 mg total) by mouth every 6 (six) hours as needed for mild pain or headache.   apixaban (ELIQUIS) 5 MG TABS tablet Take 5 mg by mouth 2 (two) times daily.   aspirin 81 MG chewable tablet Chew 81 mg by mouth daily.   atorvastatin (LIPITOR) 40 MG tablet Take 40 mg by mouth daily.   blood glucose meter kit and supplies Dispense based on patient and insurance preference. Use up to four times daily as directed. (FOR ICD-10 E10.9, E11.9).   calcitRIOL (ROCALTROL) 0.25 MCG capsule Take 0.25 mcg by mouth as directed. Take 1 capsule (0.25 mcg) three times a week.   famotidine (PEPCID) 20 MG tablet Take 20 mg by mouth daily as needed for indigestion or heartburn.   glucagon 1 MG injection Inject 1 mg into the  muscle as needed.   insulin aspart (NOVOLOG) 100 UNIT/ML injection Inject 5-8 Units into the skin 3 (three) times daily before meals.   LANTUS 100 UNIT/ML injection Inject 0.15 mLs (15 Units total) into the skin at bedtime.   Multiple Vitamin (MULTIVITAMIN WITH MINERALS) TABS tablet Take 1 tablet by mouth daily.   ondansetron (ZOFRAN-ODT) 4 MG disintegrating tablet Take 4 mg by mouth every 8 (eight) hours as needed for nausea.   oxyCODONE (OXY IR/ROXICODONE) 5 MG immediate release tablet Take 5 mg by mouth every 6 (six) hours as needed for breakthrough pain.   predniSONE (DELTASONE) 10 MG tablet 20 mg daily with breakfast.   sildenafil (REVATIO) 20 MG tablet Take 20 mg by mouth.   sulfamethoxazole-trimethoprim (BACTRIM DS) 800-160 MG tablet Take 1 tablet by mouth 3 (  three) times a week.   tacrolimus (PROGRAF) 1 MG capsule Take 2 capsules (2 mg total) by mouth 2 (two) times daily.   torsemide (DEMADEX) 20 MG tablet Take 1 tablet (20 mg total) by mouth daily. (Patient taking differently: Take 20 mg by mouth daily as needed.)   Vitamin D, Ergocalciferol, (DRISDOL) 1.25 MG (50000 UNIT) CAPS capsule Take 50,000 Units by mouth every 7 (seven) days.   [DISCONTINUED] BD INSULIN SYRINGE U/F 31G X 5/16" 0.3 ML MISC SMARTSIG:Injection 4 Times Daily   [DISCONTINUED] hydrOXYzine (VISTARIL) 25 MG capsule Take 25 mg by mouth 3 (three) times daily as needed for anxiety.   [DISCONTINUED] benzonatate (TESSALON PERLES) 100 MG capsule Take 1 capsule (100 mg total) by mouth 3 (three) times daily as needed for cough.   [DISCONTINUED] chlorpheniramine-HYDROcodone (TUSSIONEX) 10-8 MG/5ML Take 5 mLs by mouth every 12 (twelve) hours as needed for cough.   [DISCONTINUED] enoxaparin (LOVENOX) 60 MG/0.6ML injection Inject 0.575 mLs (57.5 mg total) into the skin every 12 (twelve) hours.   [DISCONTINUED] feeding supplement, GLUCERNA SHAKE, (GLUCERNA SHAKE) LIQD Take 237 mLs by mouth 2 (two) times daily between meals.    [DISCONTINUED] heparin 78295 UT/250ML infusion Inject 1,150 Units/hr into the vein continuous.   [DISCONTINUED] sulfamethoxazole-trimethoprim (BACTRIM DS) 800-160 MG tablet Take 1 tablet by mouth every Monday, Wednesday, and Friday. (Patient not taking: Reported on 10/14/2022)   [DISCONTINUED] warfarin (COUMADIN) 5 MG tablet Take 1 tablet (5 mg total) by mouth daily.   No facility-administered medications prior to visit.   No Known Allergies  Immunization History  Administered Date(s) Administered   HPV 9-valent 10/07/2015   HPV Quadrivalent 04/12/2017   Hepatitis B, Dialysis 11/11/2019   Influenza, Seasonal, Injecte, Preservative Fre 04/18/2007, 05/12/2008   Influenza,inj,Quad PF,6+ Mos 05/13/2012, 04/07/2013, 06/11/2014, 05/10/2015, 04/12/2017   Influenza,inj,quad, With Preservative 04/17/2016   Influenza-Unspecified 05/01/2016   Pneumococcal Conjugate-13 01/14/2017   Pneumococcal Polysaccharide-23 11/11/2019    Health Maintenance  Topic Date Due   FOOT EXAM  Never done   OPHTHALMOLOGY EXAM  Never done   HPV VACCINES (3 - Risk 3-dose series) 08/13/2017   DTaP/Tdap/Td (1 - Tdap) Never done   PAP-Cervical Cytology Screening  Never done   PAP SMEAR-Modifier  Never done   COVID-19 Vaccine (1) 12/03/2022 (Originally 02/28/2005)   INFLUENZA VACCINE  02/01/2023   HEMOGLOBIN A1C  02/23/2023   Diabetic kidney evaluation - Urine ACR  09/04/2023   Diabetic kidney evaluation - eGFR measurement  10/18/2023   Hepatitis C Screening  Completed   HIV Screening  Completed    Patient Care Team: Ronnald Ramp, MD as PCP - General (Family Medicine)  Review of Systems  All other systems reviewed and are negative.      Objective    BP 132/89 (BP Location: Left Arm, Patient Position: Sitting, Cuff Size: Normal)   Pulse 78   Temp (!) 97.2 F (36.2 C) (Oral)   Resp 16   Ht 4\' 11"  (1.499 m)   Wt 116 lb (52.6 kg)   SpO2 98% Comment: 2L of oxygen  BMI 23.43 kg/m     Physical Exam Vitals reviewed.  Constitutional:      General: She is not in acute distress.    Appearance: Normal appearance. She is not ill-appearing, toxic-appearing or diaphoretic.  Eyes:     Conjunctiva/sclera: Conjunctivae normal.  Cardiovascular:     Rate and Rhythm: Normal rate and regular rhythm.     Pulses: Normal pulses.     Heart sounds: Murmur  heard.     No friction rub.  Pulmonary:     Effort: Pulmonary effort is normal. No respiratory distress.     Breath sounds: Normal breath sounds. No stridor. No wheezing, rhonchi or rales.     Comments: Patient appears to be breathing comfortably on 2 L via [place]  Chest:       Comments: Post surgical scar, well healing without erythema nor drainage   Musculoskeletal:     Right lower leg: No edema.     Left lower leg: No edema.  Skin:    Findings: Rash present. No erythema.  Neurological:     Mental Status: She is alert and oriented to person, place, and time.     Depression Screen    11/21/2022    1:22 PM 09/08/2022   11:05 AM  PHQ 2/9 Scores  PHQ - 2 Score 0   Exception Documentation  Patient refusal   No results found for any visits on 11/21/22.  Assessment & Plan      Problem List Items Addressed This Visit       Cardiovascular and Mediastinum   HTN (hypertension)    Controlled BP at goal Continue current medications at current doses No medications changes today        Relevant Medications   apixaban (ELIQUIS) 5 MG TABS tablet   aspirin 81 MG chewable tablet   atorvastatin (LIPITOR) 40 MG tablet   sildenafil (REVATIO) 20 MG tablet     Respiratory   Acute hypoxic respiratory failure (HCC)    Patient has continued on supplemental oxygen, stable on 2L today  Recommended continued follow up with pulmonology as scheduled         Endocrine   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    Chronic, last creatinine 1.35 April 2024 Stable  Continue follow  up as scheduled with nephrology  Continue tacolimus 2mg  BID        Relevant Medications   aspirin 81 MG chewable tablet   atorvastatin (LIPITOR) 40 MG tablet   Type 1 diabetes mellitus with hyperglycemia (HCC)    Chronic  Endocrinology follow up as scheduled  Continue Lantus and novolog current doses  Refills sent for insulin supplies       Relevant Medications   aspirin 81 MG chewable tablet   atorvastatin (LIPITOR) 40 MG tablet   BD INSULIN SYRINGE U/F 31G X 5/16" 0.3 ML MISC     Other   Establishing care with new doctor, encounter for - Primary    Welcomed patient to [facility]  Reviewed patient's medical history, medications, surgical and social history Discussed roles and expectations for primary care physician-patient relationship Recommended patient schedule annual preventative examinations        Adjustment insomnia    New problem since hospital discharge  Reports trazodone helped  Prescribed trazodone 50mg  nightly PRN one week after starting hydroxyzine 25mg  for anxiety PRN       Relevant Medications   traZODone (DESYREL) 50 MG tablet   Anxiety    Chronic  Intermittent increased episodes of anxiety  Prescribed PRN hydroxyzine 25mg  TID       Relevant Medications   hydrOXYzine (VISTARIL) 25 MG capsule   traZODone (DESYREL) 50 MG tablet     Return in about 1 month (around 12/22/2022) for CPE,PAP.       The entirety of the information documented in the History of Present Illness, Review of Systems and  Physical Exam were personally obtained by me. Portions of this information were initially documented by Hetty Ely, CMA . I, Ronnald Ramp, MD have reviewed the documentation above for thoroughness and accuracy.      Ronnald Ramp, MD  [facility] [phone] (phone) [phone] (fax)  [facility]

## 2022-11-21 NOTE — Assessment & Plan Note (Signed)
Welcomed patient to [facility]  Reviewed patient's medical history, medications, surgical and social history Discussed roles and expectations for primary care physician-patient relationship Recommended patient schedule annual preventative examinations   

## 2022-11-21 NOTE — Assessment & Plan Note (Signed)
New problem since hospital discharge  Reports trazodone helped  Prescribed trazodone 50mg  nightly PRN one week after starting hydroxyzine 25mg  for anxiety PRN

## 2022-11-21 NOTE — Patient Instructions (Addendum)
It was a pleasure meeting you today!  Welcome to [facility].  I look forward to taking part in your care as your new primary care physician.    Summary of our discussion today:   I have sent in refills for your hydroxyzine 25mg  and Trazodone to use as needed for anxiety and sleep. Please make sure to give a week or more of using the hydroxyzine before adding in the trazodone.  We will plan to complete labs for your physical next month (June)  Please apply the prescribed Clotrimazole twice daily to the affected area to help heal the rash     You should return to our clinic in 1 month for annual physical.   Best Wishes,   Dr. Roxan Hockey

## 2022-11-21 NOTE — Assessment & Plan Note (Signed)
Patient has continued on supplemental oxygen, stable on 2L today  Recommended continued follow up with pulmonology as scheduled

## 2022-11-21 NOTE — Assessment & Plan Note (Signed)
Chronic, last creatinine 1.35 April 2024 Stable  Continue follow up as scheduled with nephrology  Continue tacolimus 2mg  BID

## 2022-11-21 NOTE — Assessment & Plan Note (Signed)
Chronic  Endocrinology follow up as scheduled  Continue Lantus and novolog current doses  Refills sent for insulin supplies

## 2022-11-21 NOTE — Assessment & Plan Note (Signed)
Chronic  Follows with pulmonology  Continue Eliquis 5mg  BID,follow up with specialist as scheduled

## 2022-11-21 NOTE — Assessment & Plan Note (Signed)
Controlled BP at goal Continue current medications at current doses No medications changes today   

## 2022-11-22 ENCOUNTER — Telehealth: Payer: Self-pay

## 2022-11-22 NOTE — Telephone Encounter (Signed)
I can provide a note stating that patient was seen 11/21/22 to establish care.   I would need a visit (can be virtual) to discuss nature of her work and job duties that she can no longer perform due to current condition. We would also need to discuss plans for return to work or will she be pursuing long term disability due to health conditions.   Please schedule patient for next available. Ok to work into reserve slot before 11A or 4P

## 2022-11-22 NOTE — Telephone Encounter (Signed)
Copied from CRM [id number]. Topic: General - Inquiry >> Nov 22, 2022  3:49 PM De Blanch wrote: Reason for CRM: Pt stated she was seen in the office yesterday and is requesting a work note. She stated she would like to ask PCP if she is able to write a work note, saying she is unable to work and is on oxygen. Pt requested that this be sent to her via MyChart.  Please advise.

## 2022-12-01 ENCOUNTER — Inpatient Hospital Stay: Payer: Medicaid Other

## 2022-12-01 ENCOUNTER — Encounter: Payer: Self-pay | Admitting: Oncology

## 2022-12-01 ENCOUNTER — Inpatient Hospital Stay: Payer: Medicaid Other | Attending: Oncology | Admitting: Oncology

## 2022-12-01 VITALS — BP 129/86 | HR 81 | Temp 98.4°F | Resp 18 | Wt 112.1 lb

## 2022-12-01 DIAGNOSIS — I2699 Other pulmonary embolism without acute cor pulmonale: Secondary | ICD-10-CM | POA: Diagnosis present

## 2022-12-01 DIAGNOSIS — Z79899 Other long term (current) drug therapy: Secondary | ICD-10-CM | POA: Diagnosis not present

## 2022-12-01 DIAGNOSIS — E538 Deficiency of other specified B group vitamins: Secondary | ICD-10-CM | POA: Diagnosis not present

## 2022-12-01 DIAGNOSIS — I2609 Other pulmonary embolism with acute cor pulmonale: Secondary | ICD-10-CM

## 2022-12-01 DIAGNOSIS — D508 Other iron deficiency anemias: Secondary | ICD-10-CM

## 2022-12-01 DIAGNOSIS — N049 Nephrotic syndrome with unspecified morphologic changes: Secondary | ICD-10-CM

## 2022-12-01 DIAGNOSIS — D509 Iron deficiency anemia, unspecified: Secondary | ICD-10-CM | POA: Insufficient documentation

## 2022-12-01 DIAGNOSIS — I2724 Chronic thromboembolic pulmonary hypertension: Secondary | ICD-10-CM

## 2022-12-01 DIAGNOSIS — F17290 Nicotine dependence, other tobacco product, uncomplicated: Secondary | ICD-10-CM | POA: Diagnosis not present

## 2022-12-01 DIAGNOSIS — Z7901 Long term (current) use of anticoagulants: Secondary | ICD-10-CM | POA: Insufficient documentation

## 2022-12-01 LAB — RETIC PANEL
Immature Retic Fract: 22 % — ABNORMAL HIGH (ref 2.3–15.9)
RBC.: 3.71 MIL/uL — ABNORMAL LOW (ref 3.87–5.11)
Retic Count, Absolute: 57.9 10*3/uL (ref 19.0–186.0)
Retic Ct Pct: 1.6 % (ref 0.4–3.1)
Reticulocyte Hemoglobin: 27.2 pg — ABNORMAL LOW (ref >27.9)

## 2022-12-01 LAB — CBC WITH DIFFERENTIAL/PLATELET
Abs Immature Granulocytes: 0.03 10*3/uL (ref 0.00–0.07)
Basophils Absolute: 0.1 10*3/uL (ref 0.0–0.1)
Basophils Relative: 1 %
Eosinophils Absolute: 0.1 10*3/uL (ref 0.0–0.5)
Eosinophils Relative: 2 %
HCT: 30 % — ABNORMAL LOW (ref 36.0–46.0)
Hemoglobin: 9.4 g/dL — ABNORMAL LOW (ref 12.0–15.0)
Immature Granulocytes: 0 %
Lymphocytes Relative: 25 %
Lymphs Abs: 1.7 10*3/uL (ref 0.7–4.0)
MCH: 25.5 pg — ABNORMAL LOW (ref 26.0–34.0)
MCHC: 31.3 g/dL (ref 30.0–36.0)
MCV: 81.3 fL (ref 80.0–100.0)
Monocytes Absolute: 0.6 10*3/uL (ref 0.1–1.0)
Monocytes Relative: 8 %
Neutro Abs: 4.3 10*3/uL (ref 1.7–7.7)
Neutrophils Relative %: 64 %
Platelets: 447 10*3/uL — ABNORMAL HIGH (ref 150–400)
RBC: 3.69 MIL/uL — ABNORMAL LOW (ref 3.87–5.11)
RDW: 15.9 % — ABNORMAL HIGH (ref 11.5–15.5)
WBC: 6.7 10*3/uL (ref 4.0–10.5)
nRBC: 0 % (ref 0.0–0.2)

## 2022-12-01 LAB — FERRITIN: Ferritin: 143 ng/mL (ref 11–307)

## 2022-12-01 LAB — IRON AND TIBC
Iron: 27 ug/dL — ABNORMAL LOW (ref 28–170)
Saturation Ratios: 8 % — ABNORMAL LOW (ref 10.4–31.8)
TIBC: 326 ug/dL (ref 250–450)
UIBC: 299 ug/dL

## 2022-12-01 LAB — VITAMIN B12: Vitamin B-12: 186 pg/mL (ref 180–914)

## 2022-12-01 LAB — PROTIME-INR
INR: 1.3 — ABNORMAL HIGH (ref 0.8–1.2)
Prothrombin Time: 16.8 seconds — ABNORMAL HIGH (ref 11.4–15.2)

## 2022-12-01 LAB — FOLATE: Folate: 16.6 ng/mL (ref >5.9)

## 2022-12-01 MED ORDER — WARFARIN SODIUM 2 MG PO TABS: 6.0000 mg | ORAL_TABLET | Freq: Every day | ORAL | 0 refills | Status: DC

## 2022-12-01 MED ORDER — IRON-VITAMIN C 65-125 MG PO TABS: 1.0000 | ORAL_TABLET | Freq: Every day | ORAL | 2 refills | Status: DC

## 2022-12-01 MED ORDER — VITAMIN B-12 1000 MCG PO TABS: 1000.0000 ug | ORAL_TABLET | Freq: Every day | ORAL | 2 refills | Status: AC

## 2022-12-01 NOTE — Assessment & Plan Note (Signed)
Lab Results  Component Value Date   HGB 9.4 (L) 12/01/2022   TIBC 326 12/01/2022   IRONPCTSAT 8 (L) 12/01/2022   FERRITIN 143 12/01/2022    Recommend patient to start oral iron supplementation.

## 2022-12-01 NOTE — Assessment & Plan Note (Signed)
On Tacrolimus, and prednisone tapering course.. Patient is on Bactrim for PCP prophylaxis given long-term steroid use. Continue follow up with nephrology

## 2022-12-01 NOTE — Assessment & Plan Note (Signed)
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  DOACs have limited data for effectiveness in nephrotic syndrome.  I had a discussion with Dr.Dgayli who also had a discussion with [facility] Dr.Rajagopal. Effectiveness of DOAC is also limited in treating CTEPH  Therefore I recommend to switch to Coumadin. I recommend patient to stop Eliquis, start Lovenox 1 mg/kg twice daily bridging to Coumadin.  Patient declines Lovenox injections.  Therefore I recommend alternative option with continue Eliquis 5 mg twice daily, bridging to Coumadin 6 mg daily.Will check INR level on 12/04/2022.  Will continue Eliquis until INR is above 2. Patient agrees with the plan and voices understanding.  Coumadin prescription sent to pharmacy.

## 2022-12-01 NOTE — Assessment & Plan Note (Signed)
Recommend patient to start B12 supplementation.

## 2022-12-01 NOTE — Assessment & Plan Note (Signed)
On  Sildenafil  Continue follow-up with pulmonology.

## 2022-12-01 NOTE — Progress Notes (Signed)
Hematology/Oncology Consult note [phone] [phone]        REFERRING PROVIDER: Brett Albino*   CHIEF COMPLAINTS/REASON FOR VISIT:  Follow up for bilateral pulmonary embolism, due to nephrotic syndrome.    ASSESSMENT & PLAN:   Pulmonary embolism (HCC) Pulm embolism due to hypercoagulability secondary to nephrotic syndrome/hypoalbuminemia She received mechanical embolectomy during last admission and had a recurrent pulmonary embolism despite taking Eliquis.  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  DOACs have limited data for effectiveness in nephrotic syndrome.  I had a discussion with Dr.Dgayli who also had a discussion with [facility] Dr.Rajagopal. Effectiveness of DOAC is also limited in treating CTEPH  Therefore I recommend to switch to Coumadin. I recommend patient to stop Eliquis, start Lovenox 1 mg/kg twice daily bridging to Coumadin.  Patient declines Lovenox injections.  Therefore I recommend alternative option with continue Eliquis 5 mg twice daily, bridging to Coumadin 6 mg daily.Will check INR level on 12/04/2022.  Will continue Eliquis until INR is above 2. Patient agrees with the plan and voices understanding.  Coumadin prescription sent to pharmacy.  Nephrotic syndrome On Tacrolimus, and prednisone tapering course.. Patient is on Bactrim for PCP prophylaxis given long-term steroid use. 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  Vitamin B12 deficiency Recommend patient to start B12 supplementation.   IDA (iron deficiency anemia) Lab Results  Component Value Date   HGB 9.4 (L) 12/01/2022   TIBC 326 12/01/2022   IRONPCTSAT 8 (L) 12/01/2022   FERRITIN 143 12/01/2022    Recommend patient to start oral iron supplementation.    Orders Placed This Encounter  Procedures   Vitamin B12    Standing Status:   Future    Number of  Occurrences:   1    Standing Expiration Date:   12/01/2023   Folate    Standing Status:   Future    Number of Occurrences:   1    Standing Expiration Date:   12/01/2023   CBC with Differential/Platelet    Standing Status:   Future    Number of Occurrences:   1    Standing Expiration Date:   12/01/2023   Ferritin    Standing Status:   Future    Number of Occurrences:   1    Standing Expiration Date:   06/02/2023   Iron and TIBC    Standing Status:   Future    Number of Occurrences:   1    Standing Expiration Date:   12/01/2023   Retic Panel    Standing Status:   Future    Number of Occurrences:   1    Standing Expiration Date:   12/01/2023   Protime-INR    Standing Status:   Future    Number of Occurrences:   1    Standing Expiration Date:   12/01/2023   Follow up TBD All questions were answered. The patient knows to call the clinic with any problems, questions or concerns.  Rickard Patience, MD, PhD [facility] Hematology Oncology 12/01/2022   HISTORY OF PRESENTING ILLNESS:   Catherine [name] is a  22 y.o.  female presents for follow up of bilateral pulmonary embolism 06/30/2022 - 07/05/2022 patient was admitted at [facility] due to AKI on CKD, proteinuria, hypoalbuminemia Ultrasound of bilateral lower extremity was negative for DVT. Patient was started on Eliquis 5 mg twice daily for prophylactic anticoagulation due to the concern of hypercoagulability  secondary to hypoalbuminemia/nephrotic syndrome.  Patient took 30 days of Eliquis and ran out after that.   08/22/2022 - 08/25/2022, patient was hospitalized at [facility] due to shortness of breath, 08/22/2021, CT chest PE: Showed acute submassive PE with right heart strain.  Multifocal bilateral upper lobe nodules-favor infectious process.  Multifocal groundglass opacities with subpleural sparing likely reflects combination of pulmonary hemorrhage and/for any infectious or inflammatory etiology. Patient was treated with heparin drip, transition to  Eliquis. She underwent thrombolysis and mechanical embolectomy by Dr. Wyn Quaker on 08/22/2022.   08/30/2022, patient returned to the emergency room due to shortness of breath. Repeat CT chest angiogram showed interval development of confluent patchy and posterior consolidation airspace disease in the right middle lobe suggesting infarct. Bulky bilateral pulmonary embolism again noted, occlusive in right middle lobe lobar branch and segmental branches to the right lower lobe, similar to prior. Medial segmental and subsegmental branches to the right lower lobe are patent on the current study despite being occluded previously. Small volume occlusive thrombus in a subsegmental right upper lobe branches similar to prior.  No change in the nonocclusive thrombus in the segmental branches to the left lower lobe.  Enlargement of pulmonary outflow tract/main pulmonary arteries.  Cavitary lesions in the lateral and posterior right upper lobe.  Nodular and patchy areas of consolidative airspace disease Patient was restarted on heparin drip and was transitioned to Lovenox 1mg /kg BID bridging to coumadin with INR goal 2-3  Cavitary lesion, Patient was seen by infectious disease physician for pulmonary infiltrates.  Pulmonologist does not recommend bronchoscopy due to high risk.     INTERVAL HISTORY Catherine [name] is a 22 y.o. female who has above history reviewed by me today presents for follow up visit for recurrent bilateral pulmonary embolism.   10/18/2022 - 11/05/2022.  Patient was hospitalized, initially at [facility] for septic shock presumably from pneumonia, she required Levophed vasopressor, covered with vancomycin and cefepime, azithromycin.  She had an echocardiogram done which showed low right atrial pressure but high RSVP with mild reduction in RV systolic function, concerned about pulmonary hypertension and was transferred to [facility] to undergo pulmonary endarterectomy [10/26/22] for her  cardiothoracic diagnosis of  chronic thromboembolic pulmonary hypertension [CTEPH]. She tolerated procedure.  Patient was on heparin drip which bridged to Eliquis at discharge. She is currently taking Sildenafil   10/26/22 right superior segment wedge resection showed - Mild to moderate pulmonary arterial hypertension with focal intraluminal organized thrombi and recanalization; features consistent with thromboembolic pulmonary hypertension.  - Patchy interstitial fibrosis with associated intraalveolar macrophages. See comment. - Granulomatous inflammation not identified  She followed up with pulmonology and was seen by Dr.Dgayli on 11/17/2022. There was concern that the pathogen was not ideal choice for her. Dr.Dgayli communicated with with [facility] team and Dr.Rajagopal agrees that pulmonary is likely a better choice for her situation.  Patient today presents for transition to pulmonary.  She reports feeling well and be compliant on all her medications.  She is alone by herself today.  Her parents have been very involved in her care in the past.  I offered to call her parents during the visit and patient declined. She denies shortness of breath, chest pain.  She has also now establish care with primary care provider.  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   Nephrotic syndrome 08/23/2019   Pulmonary embolism (HCC) 08/30/2022   Sepsis associated hypotension (HCC) 10/14/2022   Severe sepsis with septic shock (CODE) (HCC) 09/26/2021   Tobacco dependence     SURGICAL HISTORY: Past Surgical History:  Procedure Laterality Date   PULMONARY THROMBECTOMY Bilateral 08/22/2022   Procedure: PULMONARY THROMBECTOMY;  Surgeon: Annice Needy, MD;  Location: [facility] INVASIVE CV LAB;  Service: Cardiovascular;   Laterality: Bilateral;    SOCIAL HISTORY: 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  Tobacco Use   Smoking status: Former    Types: E-cigarettes    Quit date: 07/2022    Years since quitting: 0.4   Smokeless tobacco: Never   Tobacco comments:    Quit substance use since the beginning of the year   Vaping Use   Vaping Use: Former  Substance and Sexual Activity   Alcohol use: Not Currently   Drug use: Yes    Types: Marijuana    Comment: Quit using marijuana approx earlier this year   Sexual activity: Not Currently  Other Topics Concern   Not on file  Social History Narrative   Not on file   Social Determinants of Health   Financial Resource Strain: Not on file  Food Insecurity: No Food Insecurity (10/14/2022)   Hunger Vital Sign    Worried About Running Out of Food in the Last Year: Never true    Ran Out of Food in the Last Year: Never true  Transportation Needs: No Transportation Needs (10/14/2022)   PRAPARE - Administrator, Civil Service (Medical): No    Lack of Transportation (Non-Medical): No  Physical Activity: Not on file  Stress: Not on file  Social Connections: Not on file  Intimate Partner Violence: Not At Risk (10/14/2022)   Humiliation, Afraid, Rape, and Kick questionnaire    Fear of Current or Ex-Partner: No    Emotionally Abused: No    Physically Abused: No    Sexually Abused: No    FAMILY HISTORY: History reviewed. No pertinent family history.  ALLERGIES:  has No Known Allergies.  MEDICATIONS:  Current Outpatient Medications  Medication Sig Dispense Refill   acetaminophen (TYLENOL) 325 MG tablet Take 2 tablets (650 mg total) by mouth every 6 (six) hours as needed for mild pain or headache. 20 tablet 0   apixaban (ELIQUIS)  5 MG TABS tablet Take 5 mg by mouth 2 (two) times daily.     atorvastatin (LIPITOR) 40 MG tablet Take 40 mg by mouth daily.     BD INSULIN SYRINGE U/F 31G X 5/16" 0.3 ML MISC SMARTSIG:Injection 4 Times Daily 100 each 6   blood glucose meter kit and supplies Dispense based on patient and insurance preference. Use up to four times daily as directed. (FOR ICD-10 E10.9, E11.9). 1 each 0   calcitRIOL (ROCALTROL) 0.25 MCG capsule Take 0.25 mcg by mouth as directed. Take 1 capsule (0.25 mcg) three times a week.     clotrimazole (CLOTRIMAZOLE ANTI-FUNGAL) 1 % cream Apply 1 Application topically 2 (two) times daily for 14 days. 60 g 0   hydrOXYzine (VISTARIL) 25 MG capsule Take 1 capsule (25 mg total) by mouth 3 (three) times daily as needed for  anxiety. 60 capsule 2   LANTUS 100 UNIT/ML injection Inject 0.15 mLs (15 Units total) into the skin at bedtime. 4.5 mL 2   Multiple Vitamin (MULTIVITAMIN WITH MINERALS) TABS tablet Take 1 tablet by mouth daily.     ondansetron (ZOFRAN-ODT) 4 MG disintegrating tablet Take 4 mg by mouth every 8 (eight) hours as needed for nausea.     oxyCODONE (OXY IR/ROXICODONE) 5 MG immediate release tablet Take 5 mg by mouth every 6 (six) hours as needed for breakthrough pain.     predniSONE (DELTASONE) 10 MG tablet 10 mg daily with breakfast.     sildenafil (REVATIO) 20 MG tablet Take 20 mg by mouth.     sulfamethoxazole-trimethoprim (BACTRIM DS) 800-160 MG tablet Take 1 tablet by mouth 3 (three) times a week.     traZODone (DESYREL) 50 MG tablet Take 0.5-1 tablets (25-50 mg total) by mouth at bedtime as needed for sleep. 30 tablet 0   Vitamin D, Ergocalciferol, (DRISDOL) 1.25 MG (50000 UNIT) CAPS capsule Take 50,000 Units by mouth every 7 (seven) days.     warfarin (COUMADIN) 2 MG tablet Take 3 tablets (6 mg total) by mouth daily. 90 tablet 0   famotidine (PEPCID) 20 MG tablet Take 20 mg by mouth daily as needed for indigestion or heartburn. (Patient not taking: Reported on  12/01/2022)     glucagon 1 MG injection Inject 1 mg into the muscle as needed. (Patient not taking: Reported on 12/01/2022)     insulin aspart (NOVOLOG) 100 UNIT/ML injection Inject 5-8 Units into the skin 3 (three) times daily before meals. 7.2 mL 2   torsemide (DEMADEX) 20 MG tablet Take 1 tablet (20 mg total) by mouth daily. (Patient not taking: Reported on 12/01/2022) 30 tablet 0   No current facility-administered medications for this visit.    Review of Systems  Constitutional:  Negative for appetite change, chills, fatigue and fever.  HENT:   Negative for hearing loss and voice change.   Eyes:  Negative for eye problems.  Respiratory:  Negative for chest tightness and cough.   Cardiovascular:  Negative for chest pain.  Gastrointestinal:  Negative for abdominal distention, abdominal pain and blood in stool.  Endocrine: Negative for hot flashes.  Genitourinary:  Negative for difficulty urinating and frequency.   Musculoskeletal:  Negative for arthralgias.  Skin:  Negative for itching and rash.  Neurological:  Negative for extremity weakness.  Hematological:  Negative for adenopathy.  Psychiatric/Behavioral:  Negative for confusion.    PHYSICAL EXAMINATION:  Vitals:   12/01/22 1018  BP: 129/86  Pulse: 81  Resp: 18  Temp: 98.4 F (36.9 C)   Filed Weights   12/01/22 1018  Weight: 112 lb 1.6 oz (50.8 kg)    Physical Exam  LABORATORY DATA:  I have reviewed the data as listed    Latest Ref Rng & Units 12/01/2022   10:42 AM 10/18/2022    5:47 AM 10/17/2022    4:49 AM  CBC  WBC 4.0 - 10.5 K/uL 6.7  18.5  21.0   Hemoglobin 12.0 - 15.0 g/dL 9.4  40.9  81.1   Hematocrit 36.0 - 46.0 % 30.0  34.3  35.3   Platelets 150 - 400 K/uL 447  342  354       Latest Ref Rng & Units 10/18/2022    5:47 AM 10/17/2022    4:49 AM 10/16/2022    6:04 AM  CMP  Glucose 70 - 99 mg/dL 914  86  782  BUN 6 - 20 mg/dL 47  50  48   Creatinine 0.44 - 1.00 mg/dL 1.61  0.96  0.45   Sodium 135 - 145  mmol/L 133  137  136   Potassium 3.5 - 5.1 mmol/L 4.4  3.7  3.6   Chloride 98 - 111 mmol/L 108  108  108   CO2 22 - 32 mmol/L 20  22  21    Calcium 8.9 - 10.3 mg/dL 7.8  7.9  7.8       RADIOGRAPHIC STUDIES: I have personally reviewed the radiological images as listed and agreed with the findings in the report. DG Chest Port 1 View  Result Date: 10/18/2022 CLINICAL DATA:  Shortness of breath EXAM: PORTABLE CHEST 1 VIEW COMPARISON:  CXR 10/14/22, CT chest 10/14/22 FINDINGS: Small bilateral pleural effusions. No pneumothorax. Redemonstrated are extensive bilateral patchy airspace opacities which appear similar to prior exam. Unchanged cardiac and mediastinal contours. No radiographically apparent displaced rib fractures. Visualized upper abdomen is unremarkable chronic right clavicular fracture. IMPRESSION: 1. Small bilateral pleural effusions. 2. Extensive bilateral patchy airspace opacities which appear similar to prior exam and are better characterized on prior chest CT. Electronically Signed   By: Lorenza Cambridge M.D.   On: 10/18/2022 10:44   ECHOCARDIOGRAM COMPLETE  Result Date: 10/16/2022    ECHOCARDIOGRAM REPORT   Patient Name:   Catherine [name] Date of Exam: 10/15/2022 Medical Rec #:  [id number]           Height:       59.0 in Accession #:    [id number]          Weight:       138.7 lb Date of Birth:  10-24-2000           BSA:          1.579 m Patient Age:    21 years            BP:           94/75 mmHg Patient Gender: F                   HR:           87 bpm. Exam Location:  [facility] Procedure: 2D Echo Indications:     CHF I50.31  History:         Patient has prior history of Echocardiogram examinations, most                  recent 08/22/2022.  Sonographer:     Overton Mam RDCS Referring Phys:  [id number] Gillis Santa Diagnosing Phys: Chilton Si MD IMPRESSIONS  1. Left ventricular ejection fraction, by estimation, is 60 to 65%. The left ventricle has normal function. The left ventricle has no  regional wall motion abnormalities. There is mild concentric left ventricular hypertrophy. Left ventricular diastolic parameters were normal. There is the interventricular septum is flattened in systole and diastole, consistent with right ventricular pressure and volume overload.  2. Right ventricular systolic function is mildly reduced. The right ventricular size is severely enlarged. There is severely elevated pulmonary artery systolic pressure.  3. A small pericardial effusion is present. The pericardial effusion is localized near the right ventricle and anterior to the right ventricle. There is no evidence of cardiac tamponade.  4. The mitral valve is normal in structure. No evidence of mitral valve regurgitation. No evidence of mitral stenosis.  5. Tricuspid valve regurgitation is severe.  6. The aortic valve is  tricuspid. Aortic valve regurgitation is not visualized. No aortic stenosis is present.  7. Pulmonic valve regurgitation is moderate.  8. The inferior vena cava is normal in size with greater than 50% respiratory variability, suggesting right atrial pressure of 3 mmHg. FINDINGS  Left Ventricle: Left ventricular ejection fraction, by estimation, is 60 to 65%. The left ventricle has normal function. The left ventricle has no regional wall motion abnormalities. The left ventricular internal cavity size was normal in size. There is  mild concentric left ventricular hypertrophy. The interventricular septum is flattened in systole and diastole, consistent with right ventricular pressure and volume overload. Left ventricular diastolic parameters were normal. Indeterminate filling pressures. Right Ventricle: The right ventricular size is severely enlarged. No increase in right ventricular wall thickness. Right ventricular systolic function is mildly reduced. There is severely elevated pulmonary artery systolic pressure. The tricuspid regurgitant velocity is 5.12 m/s, and with an assumed right atrial pressure of  3 mmHg, the estimated right ventricular systolic pressure is 107.9 mmHg. Left Atrium: Left atrial size was normal in size. Right Atrium: Right atrial size was normal in size. Pericardium: A small pericardial effusion is present. The pericardial effusion is localized near the right ventricle and anterior to the right ventricle. There is excessive respiratory variation in the tricuspid valve spectral Doppler velocities. There is no evidence of cardiac tamponade. Mitral Valve: The mitral valve is normal in structure. No evidence of mitral valve regurgitation. No evidence of mitral valve stenosis. Tricuspid Valve: The tricuspid valve is normal in structure. Tricuspid valve regurgitation is severe. No evidence of tricuspid stenosis. Aortic Valve: The aortic valve is tricuspid. Aortic valve regurgitation is not visualized. No aortic stenosis is present. Aortic valve peak gradient measures 5.0 mmHg. Pulmonic Valve: The pulmonic valve was normal in structure. Pulmonic valve regurgitation is moderate. No evidence of pulmonic stenosis. Aorta: The aortic root is normal in size and structure. Venous: The inferior vena cava is normal in size with greater than 50% respiratory variability, suggesting right atrial pressure of 3 mmHg. IAS/Shunts: No atrial level shunt detected by color flow Doppler.  LEFT VENTRICLE PLAX 2D LVIDd:         2.40 cm     Diastology LVIDs:         1.70 cm     LV e' medial:    8.92 cm/s LV PW:         1.30 cm     LV E/e' medial:  9.2 LV IVS:        1.10 cm     LV e' lateral:   10.80 cm/s LVOT diam:     1.70 cm     LV E/e' lateral: 7.6 LV SV:         25 LV SV Index:   16 LVOT Area:     2.27 cm  LV Volumes (MOD) LV vol d, MOD A4C: 18.0 ml LV vol s, MOD A4C: 7.3 ml LV SV MOD A4C:     18.0 ml RIGHT VENTRICLE RV Basal diam:  4.40 cm RV S prime:     11.20 cm/s TAPSE (M-mode): 1.9 cm LEFT ATRIUM           Index       RIGHT ATRIUM           Index LA diam:      2.70 cm 1.71 cm/m  RA Area:     12.60 cm LA Vol  (A2C): 14.6 ml 9.25 ml/m  RA Volume:  28.30 ml  17.93 ml/m LA Vol (A4C): 6.9 ml  4.39 ml/m  AORTIC VALVE                 PULMONIC VALVE AV Area (Vmax): 1.62 cm     PV Vmax:          0.85 m/s AV Vmax:        112.00 cm/s  PV Peak grad:     2.9 mmHg AV Peak Grad:   5.0 mmHg     PR End Diast Vel: 25.50 msec LVOT Vmax:      79.80 cm/s   RVOT Peak grad:   1 mmHg LVOT Vmean:     48.000 cm/s LVOT VTI:       0.111 m  AORTA Ao Root diam: 2.70 cm        PULMONARY ARTERY Ao Asc diam:  2.10 cm        MPA diam:        2.40 cm MITRAL VALVE               TRICUSPID VALVE MV Area (PHT): 4.39 cm    TR Peak grad:   104.9 mmHg MV Decel Time: 173 msec    TR Vmax:        512.00 cm/s MV E velocity: 81.80 cm/s MV A velocity: 77.60 cm/s  SHUNTS MV E/A ratio:  1.05        Systemic VTI:  0.11 m                            Systemic Diam: 1.70 cm Chilton Si MD Electronically signed by Chilton Si MD Signature Date/Time: 10/16/2022/6:06:16 AM    Final    CT Chest High Resolution  Result Date: 10/14/2022 CLINICAL DATA:  Hemoptysis, dizziness and nephrotic syndrome. Recent sub massive pulmonary embolus with right heart dysfunction. Nephrotic syndrome. EXAM: CT CHEST WITHOUT CONTRAST TECHNIQUE: Multidetector CT imaging of the chest was performed following the standard protocol without intravenous contrast. High resolution imaging of the lungs, as well as inspiratory and expiratory imaging, was performed. RADIATION DOSE REDUCTION: This exam was performed according to the departmental dose-optimization program which includes automated exposure control, adjustment of the mA and/or kV according to patient size and/or use of iterative reconstruction technique. COMPARISON:  10/12/2022 and 09/25/2022 and 08/22/2022. FINDINGS: Cardiovascular: Mild cardiac enlargement. Small pericardial effusion appears similar to previous exam. Mediastinum/Nodes: Thyroid gland, trachea, and esophagus are unremarkable. No enlarged mediastinal or axillary  lymph nodes. Hilar structures are suboptimally evaluated due to lack of IV contrast. Lungs/Pleura: Small right pleural effusion and trace left pleural effusion are again noted. There is a large geographic area of progressive ground-glass attenuation involving the right upper lobe, image 35/2. A patchy area of ground-glass attenuation is noted within the medial left upper lobe which is also increased from the previous exam more generalized diffuse ground-glass attenuation is noted within the right middle lobe and both lower lobes. -Peripheral, nodular consolidation with central cavitation within the right upper lobe measures 2.1 x 1.1 cm, image 38/2. Formally 2.1 by 0.7 cm. -Within the posterolateral right lung base there is a subpleural cavitary process measuring 3.0 x 1.8 cm, image 43/2. Previously 2.8 x 1.7 cm. -Consolidation with central cavitation involving the right middle lobe is again identified. This measures 5.5 x 4.3 cm, image 64/2. On the previous exam this area measured 6.1 x 3.7 cm. -New rounded area of masslike consolidation within the  central right middle lobe extending across the posterior major fissure into the superior segment of right lower lobe measures 2.3 x 2.8 cm, image 61/2. -Similar appearance of subpleural consolidation within the posteromedial right base, image 85/2. Subpleural cystic changes are identified overlying bilateral posterior lung bases and to a lesser extent the subpleural left apex, image 17/2. Upper Abdomen: No acute abnormality. Musculoskeletal: No chest wall mass or suspicious bone lesions identified. IMPRESSION: 1. Large geographic area of progressive ground-glass attenuation is identified within the right upper lobe. Smaller area of progressive subpleural ground-glass attenuation is noted within the left upper lobe. These areas may represent foci of alveolar hemorrhage, infection, or edema. 2. Multifocal areas of nodular and masslike architectural distortion with internal  cavitation are again noted. The largest areas in the right middle lobe. As mention previously these may represent areas of pulmonary infarct with internal necrosis. Differential considerations include septic emboli, atypical infection or vasculitis. 3. New area of nodular masslike architectural distortion within the right middle lobe and superior segment of the right lower lobe (centered around the major fissure). This may also represent an area of evolving pulmonary infarct, embolic phenomenon, infection or vasculitis. 4. Unchanged small right pleural effusion and trace left pleural effusion. 5. Subpleural cystic changes are again seen within both lung bases. Etiology is indeterminate. Cannot exclude underlying fibrotic interstitial lung disease. Electronically Signed   By: Signa Kell M.D.   On: 10/14/2022 11:05   CT HEAD WO CONTRAST ( )  Result Date: 10/14/2022 CLINICAL DATA:  Syncope/presyncope. EXAM: CT HEAD WITHOUT CONTRAST TECHNIQUE: Contiguous axial images were obtained from the base of the skull through the vertex without intravenous contrast. RADIATION DOSE REDUCTION: This exam was performed according to the departmental dose-optimization program which includes automated exposure control, adjustment of the mA and/or kV according to patient size and/or use of iterative reconstruction technique. COMPARISON:  None Available. FINDINGS: Brain: No evidence of acute infarction, hemorrhage, hydrocephalus, extra-axial collection or mass lesion/mass effect. Vascular: No hyperdense vessel or unexpected calcification. Skull: Normal. Negative for fracture or focal lesion. Sinuses/Orbits: No acute finding. IMPRESSION: Negative head CT. Electronically Signed   By: Tiburcio Pea M.D.   On: 10/14/2022 04:38   DG Chest Portable 1 View  Result Date: 10/14/2022 CLINICAL DATA:  worsening SOB EXAM: PORTABLE CHEST 1 VIEW COMPARISON:  Chest x-ray 10/13/2022 FINDINGS: The heart and mediastinal contours are  unchanged. Interval worsening of diffuse patchy airspace opacities within the right lung. No pulmonary edema. No pleural effusion. No pneumothorax. No acute osseous abnormality. IMPRESSION: Interval worsening of diffuse patchy airspace opacities within the right lung. Electronically Signed   By: Tish Frederickson M.D.   On: 10/14/2022 01:51   DG Chest Port 1 View  Result Date: 10/13/2022 CLINICAL DATA:  Pain. EXAM: PORTABLE CHEST 1 VIEW COMPARISON:  Radiograph and CT yesterday FINDINGS: Similar right pleural effusion, the left pleural effusion on CT is not well-defined by radiograph. Ill-defined airspace disease throughout the right lung, similar in radiographic appearance. The cavitary components on CT are not well-defined by radiograph. Stable heart size and mediastinal contours. No pneumothorax. Remote right clavicle fracture. IMPRESSION: Unchanged appearance of the chest with right pleural effusion and ill-defined airspace disease throughout the right lung. Cavitary components on CT are not well-defined by radiograph. Electronically Signed   By: Narda Rutherford M.D.   On: 10/13/2022 23:17   CT Chest W Contrast  Result Date: 10/12/2022 CLINICAL DATA:  Pneumonia suspected EXAM: CT CHEST WITH CONTRAST TECHNIQUE: Multidetector CT imaging of  the chest was performed during intravenous contrast administration. RADIATION DOSE REDUCTION: This exam was performed according to the departmental dose-optimization program which includes automated exposure control, adjustment of the mA and/or kV according to patient size and/or use of iterative reconstruction technique. CONTRAST:  75mL OMNIPAQUE IOHEXOL 300 MG/ML  SOLN COMPARISON:  Multiple priors, most recent chest CT dated September 25, 2022 FINDINGS: Cardiovascular: Unchanged near occlusive pulmonary embolus of the distal right pulmonary artery extending into the interlobar and middle lobe lobar artery. Additional scattered areas of segmental and subsegmental pulmonary  embolus seen. Normal heart size. Trace pericardial effusion. Dilated main pulmonary artery, measuring up to 3.2 cm. Elevated RV to LV ratio of 1.6, similar to prior. Mediastinum/Nodes: No enlarged lymph nodes seen in the chest. Thyroid and esophagus are unremarkable. Lungs/Pleura: Central airways are patent. Peripheral wedge-shaped area of consolidation of the right middle lobe with new associated cavitation, overall decreased in size when compared with the prior exam but increased in density. Additional nodular opacity of the right upper lobe measuring 2.1 x 0.9 cm on series 3, image 61, previously 2.2 x 1.1 cm, decreased in size with new cavitation. Other previously cavitary seen lesions are unchanged when compared with the prior exam. New diffuse ground-glass opacities. New small bilateral pleural effusions. Decreased patchy consolidations of the right upper lobe. Similar patchy consolidations of the right-greater-than-left lung bases. Similar cystic change of the costophrenic angles. Upper Abdomen: No acute abnormality. Musculoskeletal: No chest wall abnormality. No acute or significant osseous findings. IMPRESSION: 1. Unchanged near occlusive pulmonary embolus of the right interlobar artery and occlusive thrombus of the right middle lobe lobar artery. Additional scattered bilateral areas of segmental and subsegmental pulmonary emboli again seen, similar to prior. 2. Elevated RV to LV ratio, similar to prior exam, consistent with right heart dysfunction. 3. New diffuse ground-glass opacities and small bilateral pleural effusions, likely due to pulmonary edema. 4. Multiple cavitary lesions again seen. A right middle lobe consolidation and right upper lobe nodular opacity demonstrates new cavitation. Other previously cavitary seen lesions are unchanged when compared with the prior exam. Differential considerations include, evolving pulmonary infarcts, septic emboli, atypical infection, or vasculitis such as GPA.  5. Decreased patchy consolidations of the right upper lobe. Similar patchy consolidations of the right-greater-than-left lung bases. Findings are likely due to infectious or inflammatory etiology. 6. Similar cystic change of the costophrenic angles. Consider dedicated ILD protocol CT after resolution of acute symptoms for better evaluation. Electronically Signed   By: Allegra Lai M.D.   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 Central cavitation area in the right mid lung is better demonstrated by comparison chest CT. Small right effusion suspected blunting the right costophrenic angle. Heart is enlarged. No superimposed edema pattern. Negative for pneumothorax. Trachea midline. Old right mid clavicle fracture noted. IMPRESSION: 1. Worsening diffuse right lung and left lower lobe airspace process concerning for multifocal pneumonia. 2. Small right effusion. 3. Cardiomegaly without CHF. Electronically Signed   By: Judie Petit.  Shick M.D.   On: 10/12/2022 10:45   CT Angio Chest PE W and/or Wo Contrast  Result Date: 09/25/2022 CLINICAL DATA:  High probability for PE. Back pain and shortness of breath. EXAM: CT ANGIOGRAPHY CHEST WITH CONTRAST TECHNIQUE: Multidetector CT imaging of the chest was  performed using the standard protocol during bolus administration of intravenous contrast. Multiplanar CT image reconstructions and MIPs were obtained to evaluate the vascular anatomy. RADIATION DOSE REDUCTION: This exam was performed according to the departmental dose-optimization program which includes automated exposure control, adjustment of the mA and/or kV according to patient size and/or use of iterative reconstruction technique. CONTRAST:  75mL OMNIPAQUE IOHEXOL 350 MG/ML  SOLN COMPARISON:  CT angiogram chest 09/20/2022 FINDINGS: Cardiovascular: There is adequate opacification of the pulmonary arteries to the segmental level. There are bilateral lower lobar, segmental and subsegmental pulmonary emboli. There also segmental and subsegmental pulmonary emboli in the bilateral upper lobes. Overall appearance is similar to prior the heart is mildly enlarged there is a small pericardial effusion, unchanged. Aorta is normal in size. Mediastinum/Nodes: Right hilar lymphadenopathy has increased now measuring 2.7 x 2.3 cm (previously 2.4 x 2.1 cm). Subcarinal lymphadenopathy is increased now measuring 14 mm short axis (previously 11 mm). There is increasing soft tissue density in the anterior mediastinum. The esophagus and visualized thyroid gland are within normal limits. Lungs/Pleura: Right middle lobe consolidation is unchanged. There are new patchy and nodular areas of airspace and ground-glass opacities throughout the right upper lobe worrisome for infection/inflammation. Previously identified cystic/cavitary lesions are again seen predominantly in the right upper lobe. Largest measures up to 3.3 cm and has minimally decreased in size. There is no pneumothorax. Trachea and central airways are patent. No pleural effusion. Upper Abdomen: No acute abnormality. Musculoskeletal: There is a healed right clavicular fracture. No acute fractures are seen. Review of the MIP images confirms the above findings. IMPRESSION: 1. Unchanged bilateral lower lobar, segmental and subsegmental pulmonary emboli as well as segmental and subsegmental pulmonary emboli in the upper lobes. 2. New patchy and nodular areas of airspace and ground-glass opacities throughout the right upper lobe worrisome for infection/inflammation. 3. Unchanged right middle lobe consolidation. 4. Cystic/cavitary lesions in the right upper lobe are stable to slightly decreased in size. 5. Increasing right hilar and subcarinal  lymphadenopathy. 6. Increasing soft tissue density in the anterior mediastinum. This may represent thymic hyperplasia, but other etiologies are not excluded. 7. Stable cardiomegaly and small pericardial effusion. Electronically Signed   By: Darliss Cheney M.D.   On: 09/25/2022 15:20   DG Chest 2 View  Result Date: 09/25/2022 CLINICAL DATA:  Shortness of breath EXAM: CHEST - 2 VIEW COMPARISON:  Chest radiograph 08/30/2022 FINDINGS: Stable cardiomediastinal contours. There are heterogeneous opacities throughout the right lung. There is a cavitary lesion in the mid lung. The left lung appears clear. No pneumothorax or pleural effusion. No acute finding in the visualized skeleton. IMPRESSION: Nonspecific heterogeneous opacities throughout the right lung with at least 1 cavitary lesion. Electronically Signed   By: Emmaline Kluver M.D.   On: 09/25/2022 13:25   CT Angio Chest PE W and/or Wo Contrast  Result Date: 09/20/2022 CLINICAL DATA:  Concern for pulmonary embolism. EXAM: CT ANGIOGRAPHY CHEST WITH CONTRAST TECHNIQUE: Multidetector CT imaging of the chest was performed using the standard protocol during bolus administration of intravenous contrast. Multiplanar CT image reconstructions and MIPs were obtained to evaluate the vascular anatomy. RADIATION DOSE REDUCTION: This exam was performed according to the departmental dose-optimization program which includes automated exposure control, adjustment of the mA and/or kV according to patient size and/or use of iterative reconstruction technique. CONTRAST:  75mL OMNIPAQUE IOHEXOL 350 MG/ML SOLN COMPARISON:  Chest CT dated 08/30/2022. FINDINGS: Cardiovascular: Top-normal cardiac size. Small pericardial effusion measuring 5 mm in thickness anterior to  the heart. There is dilatation of the right heart chambers. Retrograde flow of contrast from the right atrium into the IVC suggestive of right heart dysfunction. The thoracic aorta is unremarkable. There is dilatation  of the main pulmonary trunk suggestive of pulmonary hypertension. Relatively similar appearance or minimally improved bilateral pulmonary emboli compared to the CT of 08/30/2022. There is near occlusive embolus involving the right middle lobe branch. The RV/LV ratio is 1.6 in keeping with right heart straining. Mediastinum/Nodes: Right hilar adenopathy. The esophagus is grossly unremarkable. No mediastinal fluid collection. Lungs/Pleura: Large area of consolidation in the right middle lobe slightly decreased in size since the prior CT. Additional scattered ground-glass and nodular densities as seen previously. Additional cavitary lesions bilaterally similar to prior CT. Probable trace bilateral pleural effusions. No pneumothorax. The central airways are patent. Upper Abdomen: No acute abnormality. Musculoskeletal: No chest wall abnormality. No acute or significant osseous findings. Review of the MIP images confirms the above findings. IMPRESSION: 1. Relatively similar appearance or minimally improved bilateral pulmonary emboli compared to the CT of 08/30/2022. There is near occlusive embolus involving the right middle lobe branch. There is evidence of right heart straining. 2. Large area of consolidation in the right middle lobe slightly decreased in size since the prior CT. Additional scattered ground-glass and nodular densities as well as cavitary lesions bilaterally similar to prior CT. 3. Probable trace bilateral pleural effusions. These results will be called to the ordering clinician or representative by the Radiologist Assistant, and communication documented in the PACS or [employer]. Electronically Signed   By: Elgie Collard M.D.   On: 09/20/2022 18:05

## 2022-12-04 ENCOUNTER — Telehealth: Payer: Self-pay

## 2022-12-04 ENCOUNTER — Inpatient Hospital Stay: Payer: Medicaid Other | Attending: Oncology

## 2022-12-04 ENCOUNTER — Other Ambulatory Visit: Payer: Self-pay | Admitting: Family Medicine

## 2022-12-04 DIAGNOSIS — Z7901 Long term (current) use of anticoagulants: Secondary | ICD-10-CM | POA: Diagnosis not present

## 2022-12-04 DIAGNOSIS — I2699 Other pulmonary embolism without acute cor pulmonale: Secondary | ICD-10-CM | POA: Insufficient documentation

## 2022-12-04 DIAGNOSIS — I2609 Other pulmonary embolism with acute cor pulmonale: Secondary | ICD-10-CM

## 2022-12-04 LAB — PROTIME-INR
INR: 1.6 — ABNORMAL HIGH (ref 0.8–1.2)
Prothrombin Time: 19.2 seconds — ABNORMAL HIGH (ref 11.4–15.2)

## 2022-12-04 NOTE — Telephone Encounter (Signed)
Called and spoke to patient's mother and informed her that patient's B12 and iron are low and that Dr. Cathie Hoops has sent in Rx for these to her pharmacy. Mother stated that she will pick up these Rx's.

## 2022-12-04 NOTE — Telephone Encounter (Signed)
-----   Message from Rickard Patience, MD sent at 12/01/2022 11:39 PM EDT ----- Low B12 and low iron panel I recommend her to take B12 and vitron c supplementation. Rx sent.

## 2022-12-04 NOTE — Telephone Encounter (Unsigned)
Medication Refill - Medication: sulfamethoxazole-trimethoprim (BACTRIM DS) 800-160 MG tablet [[id number]]   calcitRIOL (ROCALTROL) 0.25 MCG capsule [[id number]]   tacrolimus (PROGRAF) 1 MG capsule [[id number]]  DISCONTINUED    sildenafil (REVATIO) 20 MG tablet [[id number]]    Has the patient contacted their pharmacy? Yes.   (Agent: If no, request that the patient contact the pharmacy for the refill. If patient does not wish to contact the pharmacy document the reason why and proceed with request.) (Agent: If yes, when and what did the pharmacy advise?)  Preferred Pharmacy (with phone number or street name): [id number] [place], [place] - [address] AT [facility] [address] Phone: [phone]  Fax: [phone]   Has the patient been seen for an appointment in the last year OR does the patient have an upcoming appointment? Yes.    Agent: Please be advised that RX refills may take up to 3 business days. We ask that you follow-up with your pharmacy.

## 2022-12-05 NOTE — Telephone Encounter (Signed)
Requested medications are due for refill today.  Unsure  Requested medications are on the active medications list.  Tacrolimus is not the others are.  Last refill. Varied  Future visit scheduled.   yes  Notes to clinic.  3 medications are listed as historical.  Tacrolimus is not on med list and is not delegated.   Requested Prescriptions  Pending Prescriptions Disp Refills   sulfamethoxazole-trimethoprim (BACTRIM DS) 800-160 MG tablet      Sig: Take 1 tablet by mouth 3 (three) times a week.     Off-Protocol Failed - 12/04/2022 10:05 AM      Failed - Medication not assigned to a protocol, review manually.      Passed - Valid encounter within last 12 months    Recent Outpatient Visits           2 weeks ago Establishing care with new doctor, encounter for   [facility] Catherine [name], Catherine Cooler, MD       Future Appointments             In 3 weeks Catherine [name], Catherine Cooler, MD [facility]             calcitRIOL (ROCALTROL) 0.25 MCG capsule      Sig: Take 1 capsule (0.25 mcg total) by mouth as directed. Take 1 capsule (0.25 mcg) three times a week.     Endocrinology:  Vitamins - Vitamin D Supplementation - calcitriol Failed - 12/04/2022 10:05 AM      Failed - PTH in normal range and within 360 days    No results found for: "IOPTH", "PTHINTACTFNA", "PTH"       Failed - Ca in normal range and within 360 days    Calcium  Date Value Ref Range Status  10/18/2022 7.8 (L) 8.9 - 10.3 mg/dL Final         Passed - Phosphate in normal range and within 360 days    Phosphorus  Date Value Ref Range Status  10/18/2022 3.0 2.5 - 4.6 mg/dL Final    Comment:    Performed at [facility], [address], [place], [place] [address]         Passed - Valid encounter within last 12 months    Recent Outpatient Visits           2 weeks ago Establishing care with new doctor, encounter for   [facility] Catherine [name], Catherine Cooler, MD       Future Appointments             In 3 weeks Catherine [name], Catherine Cooler, MD [facility]             sildenafil (REVATIO) 20 MG tablet 10 tablet     Sig: Take 1 tablet (20 mg total) by mouth.     Urology: Erectile Dysfunction Agents Passed - 12/04/2022 10:05 AM      Passed - AST in normal range and within 360 days    AST  Date Value Ref Range Status  10/13/2022 18 15 - 41 U/L Final         Passed - ALT in normal range and within 360 days    ALT  Date Value Ref Range Status  10/13/2022 12 0 - 44 U/L Final         Passed - Last BP in normal range    BP Readings from Last 1 Encounters:  12/01/22 129/86  Passed - Valid encounter within last 12 months    Recent Outpatient Visits           2 weeks ago Establishing care with new doctor, encounter for   [facility] Catherine [name], [place], [place]       Future Appointments             In 3 weeks Catherine [name], Catherine Cooler, MD [facility]             tacrolimus (PROGRAF) 1 MG capsule 120 capsule 2    Sig: Take 2 capsules (2 mg total) by mouth 2 (two) times daily.     Not Delegated - Immunology: Immunosuppressive Agents - tacrolimus Failed - 12/04/2022 10:05 AM      Failed - This refill cannot be delegated      Passed - Valid encounter within last 12 months    Recent Outpatient Visits           2 weeks ago Establishing care with new doctor, encounter for   [facility] Catherine [name], Catherine Cooler, MD       Future Appointments             In 3 weeks Catherine [name], Catherine Cooler, MD [facility]

## 2022-12-05 NOTE — Telephone Encounter (Signed)
Pt is calling in checking on the status of this refill request. Please advise.

## 2022-12-06 ENCOUNTER — Telehealth: Payer: Self-pay

## 2022-12-06 MED ORDER — CALCITRIOL 0.25 MCG PO CAPS: 0.2500 ug | ORAL_CAPSULE | ORAL | 2 refills | Status: DC

## 2022-12-06 MED ORDER — SULFAMETHOXAZOLE-TRIMETHOPRIM 800-160 MG PO TABS: 1.0000 | ORAL_TABLET | ORAL | 0 refills | Status: DC

## 2022-12-06 MED ORDER — SILDENAFIL CITRATE 20 MG PO TABS: 20.0000 mg | ORAL_TABLET | Freq: Every day | ORAL | 1 refills | Status: AC

## 2022-12-06 MED ORDER — TACROLIMUS 1 MG PO CAPS: 2.0000 mg | ORAL_CAPSULE | Freq: Two times a day (BID) | ORAL | 2 refills | Status: AC

## 2022-12-06 NOTE — Telephone Encounter (Signed)
Called and spoke to patient's mother and informed her that patient should continue Eliquis 5mg  BID and Coumadin 6mg  daily. I also informed her that Dr. Cathie Hoops would like to repeat INR on 6/6 and that I will have scheduling schedule this and notify her through MyChart.

## 2022-12-06 NOTE — Telephone Encounter (Signed)
-----   Message from Rickard Patience, MD sent at 12/05/2022  8:38 PM EDT ----- Recommend patient to continue Eliquis 5mg  BID,  Continue coumadin 6mg  daily.  Repeat INR stat on 6/6

## 2022-12-07 ENCOUNTER — Inpatient Hospital Stay: Payer: Medicaid Other

## 2022-12-07 DIAGNOSIS — I2609 Other pulmonary embolism with acute cor pulmonale: Secondary | ICD-10-CM

## 2022-12-07 DIAGNOSIS — I2699 Other pulmonary embolism without acute cor pulmonale: Secondary | ICD-10-CM | POA: Diagnosis not present

## 2022-12-07 LAB — PROTIME-INR
INR: 2.7 — ABNORMAL HIGH (ref 0.8–1.2)
Prothrombin Time: 28.8 seconds — ABNORMAL HIGH (ref 11.4–15.2)

## 2022-12-08 ENCOUNTER — Telehealth: Payer: Self-pay

## 2022-12-08 ENCOUNTER — Other Ambulatory Visit: Payer: Self-pay

## 2022-12-08 DIAGNOSIS — I2609 Other pulmonary embolism with acute cor pulmonale: Secondary | ICD-10-CM

## 2022-12-08 NOTE — Telephone Encounter (Signed)
Spoke to pt's mother and informed her of MD recommendation. She verbalized understanding and will let Lesa know.   Please schedule lab (INR) on 6/11 @10 :30. Pt's mother aware and will let pt know.

## 2022-12-08 NOTE — Telephone Encounter (Signed)
-----   Message from Rickard Patience, MD sent at 12/08/2022  1:52 PM EDT ----- Please advise her to stop Eliquis. Continue coumadin 6mg  daily  Repeat INR on 6/11 thanks.

## 2022-12-12 ENCOUNTER — Inpatient Hospital Stay: Payer: Medicaid Other

## 2022-12-12 DIAGNOSIS — I2699 Other pulmonary embolism without acute cor pulmonale: Secondary | ICD-10-CM | POA: Diagnosis not present

## 2022-12-12 DIAGNOSIS — I2609 Other pulmonary embolism with acute cor pulmonale: Secondary | ICD-10-CM

## 2022-12-12 LAB — PROTIME-INR
INR: 3.6 — ABNORMAL HIGH (ref 0.8–1.2)
Prothrombin Time: 36.5 seconds — ABNORMAL HIGH (ref 11.4–15.2)

## 2022-12-13 ENCOUNTER — Other Ambulatory Visit: Payer: Self-pay | Admitting: Oncology

## 2022-12-13 ENCOUNTER — Telehealth: Payer: Self-pay

## 2022-12-13 ENCOUNTER — Other Ambulatory Visit: Payer: Self-pay

## 2022-12-13 DIAGNOSIS — I2609 Other pulmonary embolism with acute cor pulmonale: Secondary | ICD-10-CM

## 2022-12-13 DIAGNOSIS — I2724 Chronic thromboembolic pulmonary hypertension: Secondary | ICD-10-CM

## 2022-12-13 MED ORDER — WARFARIN SODIUM 1 MG PO TABS: 5.0000 mg | ORAL_TABLET | Freq: Every day | ORAL | 1 refills | Status: DC

## 2022-12-13 NOTE — Telephone Encounter (Signed)
Spoke to pt's mother, Kenney Houseman, informed her of coumadin dosage recommendation. Also informed her that we will send referral to coumadin clinic for further management. She verbalized understanding.

## 2022-12-13 NOTE — Telephone Encounter (Signed)
-----   Message from Rickard Patience, MD sent at 12/13/2022  3:09 PM EDT ----- Recommend her to take 4mg  today and tomorrow take 5mg  daily.  and I will send her a Rx of 1mg  tablets.  Repeat INR next week

## 2022-12-18 ENCOUNTER — Telehealth: Payer: Self-pay

## 2022-12-18 ENCOUNTER — Inpatient Hospital Stay: Payer: Medicaid Other

## 2022-12-18 DIAGNOSIS — I2609 Other pulmonary embolism with acute cor pulmonale: Secondary | ICD-10-CM

## 2022-12-18 DIAGNOSIS — I2699 Other pulmonary embolism without acute cor pulmonale: Secondary | ICD-10-CM | POA: Diagnosis not present

## 2022-12-18 LAB — PROTIME-INR
INR: 1.7 — ABNORMAL HIGH (ref 0.8–1.2)
Prothrombin Time: 20.6 seconds — ABNORMAL HIGH (ref 11.4–15.2)

## 2022-12-18 NOTE — Telephone Encounter (Signed)
Called and spoke to pt, informed of MD recommendation for coumadin:  Take 6mg  Mon, wed, Fri Take 5mg  Tues, thurs, sat, sun

## 2022-12-22 ENCOUNTER — Ambulatory Visit (INDEPENDENT_AMBULATORY_CARE_PROVIDER_SITE_OTHER): Payer: Medicaid Other | Admitting: Student in an Organized Health Care Education/Training Program

## 2022-12-22 ENCOUNTER — Encounter: Payer: Self-pay | Admitting: Student in an Organized Health Care Education/Training Program

## 2022-12-22 VITALS — BP 102/70 | HR 62 | Temp 98.0°F | Ht 59.0 in | Wt 109.8 lb

## 2022-12-22 DIAGNOSIS — I2724 Chronic thromboembolic pulmonary hypertension: Secondary | ICD-10-CM

## 2022-12-22 DIAGNOSIS — E1021 Type 1 diabetes mellitus with diabetic nephropathy: Secondary | ICD-10-CM | POA: Diagnosis not present

## 2022-12-22 DIAGNOSIS — I2699 Other pulmonary embolism without acute cor pulmonale: Secondary | ICD-10-CM

## 2022-12-22 DIAGNOSIS — J984 Other disorders of lung: Secondary | ICD-10-CM

## 2022-12-22 NOTE — Progress Notes (Signed)
Assessment & Plan:   1. CTEPH (chronic thromboembolic pulmonary hypertension) 2. 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 3. 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 Chronic hypoxic respiratory failure   Patient has a history of nephrotic syndrome secondary to minimal change disease and diabetes, complicated by venous thromboembolism and the subsequent development of CTEPH. She successfully underwent pulmonary endarterectomy on 10/26/2022 at [facility] and is presenting for follow up. Patient also had nodularity on her chest CT that was worked up with a LLL wedge biopsy during the same surgery.   While the differential for the nodularity noted on CT was broad (infectious vs non-infectious), wedge biopsy does not show any granulomatous inflammation, vasculitis, or interstitial lung disease. The biopsy is consistent with CTEPH, with findings that are consistent with smoking related lung fibrosis. Patient has abstained from smoking or vaping for a few months now. Given all this, I am inclined to monitor the nodularity and abstain from further workup.   As for her chronic hypoxic respiratory failure secondary to CTEPH, she underwent pulmonary endarterectomy at [facility] and is scheduled for follow up in August. She will require repeat echocardiography. She is compliant with Sildenafil which she will continue. She has discontinued her oxygen which is ok during the day given excellent oxygen saturation. I've asked her to use her nasal cannula nocturnally until she follows with the PVD team at [facility].   As to her choice of anti-coagulation, I was concerned that Apixaban is not the ideal choice for her. After communication with Dr. Brent Bulla from [facility] and Dr. Cathie Hoops from Oncology, she was switched to Warfarin with which she is compliant. This was imperative given literature showing warfarin is a better option for both CTEPH and VTE secondary to  minimal change disease.  -continue with oxygen via nasal cannula -continue warfarin -continue Sildenafil -follow up with PVD clinic at [facility] as previously scheduled -follow up with cardiothoracic surgery at [facility] as previously scheduled  No follow-ups on file. Patient will be seen at [facility]. I have encouraged her to schedule a follow up with [place] or to transition her care to [facility], whichever is easier for her and her care team.  I spent 30 minutes caring for this patient today, including preparing to see the patient, obtaining a medical history , reviewing a separately obtained history, performing a medically appropriate examination and/or evaluation, counseling and educating the patient/family/caregiver, ordering medications, tests, or procedures, referring and communicating with other health care professionals (not separately reported), and documenting clinical information in the electronic health record  Raechel Chute, MD [facility] 12/22/2022 12:35 PM    End of visit medications:  No orders of the defined types were placed in this encounter.    Current Outpatient Medications:    acetaminophen (TYLENOL) 325 MG tablet, Take 2 tablets (650 mg total) by mouth every 6 (six) hours as needed for mild pain or headache., Disp: 20 tablet, Rfl: 0   atorvastatin (LIPITOR) 40 MG tablet, Take 40 mg by mouth daily., Disp: , Rfl:    BD INSULIN SYRINGE U/F 31G X 5/16" 0.3 ML MISC, SMARTSIG:Injection 4 Times Daily, Disp: 100 each, Rfl: 6   blood glucose meter kit and supplies, Dispense based on patient and insurance preference. Use up to four times daily as directed. (FOR ICD-10 E10.9, E11.9)., Disp: 1 each, Rfl: 0   calcitRIOL (ROCALTROL) 0.25 MCG capsule, Take 1 capsule (0.25 mcg total) by mouth as  directed. Take 1 capsule (0.25 mcg) three times a week., Disp: 60 capsule, Rfl: 2   cyanocobalamin (VITAMIN B12) 1000 MCG tablet, Take 1 tablet (1,000 mcg total) by mouth daily., Disp: 30  tablet, Rfl: 2   famotidine (PEPCID) 20 MG tablet, Take 20 mg by mouth daily as needed for indigestion or heartburn., Disp: , Rfl:    glucagon 1 MG injection, Inject 1 mg into the muscle as needed., Disp: , Rfl:    hydrOXYzine (VISTARIL) 25 MG capsule, Take 1 capsule (25 mg total) by mouth 3 (three) times daily as needed for anxiety., Disp: 60 capsule, Rfl: 2   Iron-Vitamin C 65-125 MG TABS, Take 1 tablet by mouth daily., Disp: 30 tablet, Rfl: 2   Multiple Vitamin (MULTIVITAMIN WITH MINERALS) TABS tablet, Take 1 tablet by mouth daily., Disp: , Rfl:    ondansetron (ZOFRAN-ODT) 4 MG disintegrating tablet, Take 4 mg by mouth every 8 (eight) hours as needed for nausea., Disp: , Rfl:    oxyCODONE (OXY IR/ROXICODONE) 5 MG immediate release tablet, Take 5 mg by mouth every 6 (six) hours as needed for breakthrough pain., Disp: , Rfl:    predniSONE (DELTASONE) 10 MG tablet, 10 mg daily with breakfast., Disp: , Rfl:    sildenafil (REVATIO) 20 MG tablet, Take 1 tablet (20 mg total) by mouth daily., Disp: 90 tablet, Rfl: 1   sulfamethoxazole-trimethoprim (BACTRIM DS) 800-160 MG tablet, Take 1 tablet by mouth 3 (three) times a week., Disp: 45 tablet, Rfl: 0   tacrolimus (PROGRAF) 1 MG capsule, Take 2 capsules (2 mg total) by mouth 2 (two) times daily., Disp: 120 capsule, Rfl: 2   traZODone (DESYREL) 50 MG tablet, Take 0.5-1 tablets (25-50 mg total) by mouth at bedtime as needed for sleep., Disp: 30 tablet, Rfl: 0   Vitamin D, Ergocalciferol, (DRISDOL) 1.25 MG (50000 UNIT) CAPS capsule, Take 50,000 Units by mouth every 7 (seven) days., Disp: , Rfl:    warfarin (COUMADIN) 1 MG tablet, Take 5 tablets (5 mg total) by mouth daily., Disp: 60 tablet, Rfl: 1   insulin aspart (NOVOLOG) 100 UNIT/ML injection, Inject 5-8 Units into the skin 3 (three) times daily before meals., Disp: 7.2 mL, Rfl: 2   LANTUS 100 UNIT/ML injection, Inject 0.15 mLs (15 Units total) into the skin at bedtime., Disp: 4.5 mL, Rfl: 2   torsemide  (DEMADEX) 20 MG tablet, Take 1 tablet (20 mg total) by mouth daily. (Patient not taking: Reported on 12/01/2022), Disp: 30 tablet, Rfl: 0   Subjective:   PATIENT ID: Catherine [name] GENDER: female DOB: [birth year], MRN: [id number]  Chief Complaint  Patient presents with   Follow-up    No current sx.     HPI  Catherine [name] is a 22 year old female with a past medical history of CKD secondary to nephrotic syndrome from minimal change disease and diabetic nephropathy (secondary to T1DM) and recently diagnosed with CTEPH s/p pulmonary endarterectomy who presents to clinic for follow up.  Patient has had multiple recent admissions secondary to pulmonary embolism.  She was admitted to [facility] over New Year's (06/30/2022 - 07/05/2022) at which point she was discharged on Eliquis for prophylaxis.  She ran out of the Eliquis after that.  She then presented to our hospital on 20 February with shortness of breath and diagnosed with pulmonary embolism. She was also found to have cavitary lung lesions on her chest CT. I saw her in consultation during said admission on 08/22/2022 at which point a workup  for cavitary pulmonary lesions returned negative (beta D glucan, Aspergillus galactomannan, cryptococcal antigen, ANA, c-ANCA, p-ANCA, Quant gold, respiratory cultures, and blood cultures). She underwent thrombectomy and was discharged on Eliquis for the management of her PE only to re-present on 08/30/2022 again with shortness of breath. Repeat CT scan also showed a right lower lobe infiltrate in addition to her cavitary lesions. She was seen in consultation and felt that the risk of procedural intervention (bronchoscopy) greatly outweigh the benefits.  She was started on broad-spectrum antibiotics and again infectious workup was unrevealing. Patient was discharged on 09/05/2022 on coumadin with improvement.   I saw her in clinic on 09/12/2022 where she reported exertional dyspnea, but overall improvement in her  symptoms. She was tolerating coumadin well at the time. Patient then re-presented on 10/13/2022 secondary to hemoptysis. She was initially admitted to the ICU and subsequently transferred to the care of the hospitalist service for further management. Patient reports coming in due to streaks of blood mixed in with sputum. She denies any chest pain or tightness, denies any fevers, chills or night sweats. She continues to have exertional dyspnea. Repeat TTE 10/15/2022 showed an enlarged RV with severely elevated PASP. She was seen in consultation by pulmonology, cardiology, and infectious disease. Her chest CT images were also reviewed by our chest radiologist. Overall picture (nodularity on CT, elevated PASP, dilated RV) was highly concerning for CTEPH, and the patient was arranged to be transferred to [facility] for a CTEPH evaluation.   She was transferred on 10/18/2022 to [facility] where she was evaluated by their pulmonary vascular disease team as well as cardiothoracic surgery team. RHC performed on 10/20/2022 showed: RA 18, RV 106/6, PA 104/48 (66), PCWP (on the left) was 18. CO was 2.9, CI was 1.9, PVR was 16.6 woods units, SVR was 28. She then underwent pulmonary endarterectomy on 10/26/2022 - she had bronchoscopy (with BAL) and a LLL wedge biopsy performed during the same procedure. Patient recovered well from the procedure and was discharged home on 3 liters of nasal cannula. She was switched from Warfarin to Eliquis at [facility] on discharge. She was also started on Sildenafil with which she is compliant.   Repeat auto-immune workup performed at [facility] during said hospitalization was again negative, as was the infectious workup.   She is presenting today for follow up. She feels great overall. She's been measuring her oxygen at home and it had remained normal, so she discontinued her nasal cannula. She has more energy, less shortness of breath, and no chest pain. She remains abstinent from smoking and vape use.  She is also off the prednisone.   LLL wedge biopsy 10/26/2022   A. Lung, right superior segment; wedge resection:   - Mild to moderate pulmonary arterial hypertension with focal intraluminal organized thrombi and recanalization; features consistent with thromboembolic pulmonary hypertension.  - Patchy interstitial fibrosis with associated intraalveolar macrophages. See comment. - Granulomatous inflammation not identified.   Comment: The lung tissue demonstrates patchy interstitial fibrosis with associated intraalveolar macrophages, the histologic features raise the possibility of smocking-related interstitial fibrosis (SRIF), clinical correlation is recommended.   B. Pulmonary artery; endarterectomy:    - Fragments of degenerating and focally organized thrombus.    Ancillary information including prior medications, full medical/surgical/family/social histories, and PFTs (when available) are listed below and have been reviewed.   Review of Systems  Constitutional:  Negative for chills, fever, malaise/fatigue and weight loss.  Respiratory:  Negative for cough, hemoptysis, sputum production, shortness of breath and  wheezing.   Cardiovascular:  Negative for chest pain and leg swelling.  Skin:  Negative for rash.     Objective:   Vitals:   12/22/22 1036  BP: 102/70  Pulse: 62  Temp: 98 F (36.7 C)  TempSrc: Temporal  SpO2: 100%  Weight: 109 lb 12.8 oz (49.8 kg)  Height: 4\' 11"  (1.499 m)   100% on RA BMI Readings from Last 3 Encounters:  12/22/22 22.18 kg/m  12/01/22 22.64 kg/m  11/21/22 23.43 kg/m   Wt Readings from Last 3 Encounters:  12/22/22 109 lb 12.8 oz (49.8 kg)  12/01/22 112 lb 1.6 oz (50.8 kg)  11/21/22 116 lb (52.6 kg)    Physical Exam Constitutional:      Appearance: Normal appearance.  HENT:     Head: Normocephalic.     Nose: Nose normal.     Mouth/Throat:     Mouth: Mucous membranes are moist.  Cardiovascular:     Rate and Rhythm: Normal rate and  regular rhythm.     Comments: Midline sternotomy scar healing. Pulmonary:     Effort: Pulmonary effort is normal. No respiratory distress.     Breath sounds: No wheezing, rhonchi or rales.  Abdominal:     Palpations: Abdomen is soft.  Neurological:     General: No focal deficit present.     Mental Status: She is alert and oriented to person, place, and time. Mental status is at baseline.       Ancillary Information    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   Nephrotic syndrome 08/23/2019   Pulmonary embolism (HCC) 08/30/2022   Sepsis associated hypotension (HCC) 10/14/2022   Severe sepsis with septic shock (CODE) (HCC) 09/26/2021   Tobacco dependence      No family history on file.   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  Tobacco Use   Smoking status: Former    Types: E-cigarettes    Quit date: 07/2022    Years since quitting: 0.4   Smokeless tobacco: Never   Tobacco comments:    Quit substance use since the beginning of the year   Vaping Use   Vaping Use: Former  Substance and Sexual Activity   Alcohol use: Not Currently   Drug use: Yes    Types: Marijuana    Comment: Quit using marijuana approx earlier this year   Sexual activity: Not Currently  Other Topics Concern   Not on file  Social History Narrative   Not on  file   Social Determinants of Health   Financial Resource Strain: Not on file  Food Insecurity: No Food Insecurity (10/14/2022)   Hunger Vital Sign    Worried About Running Out of Food in the Last Year: Never true    Ran Out of Food in the Last Year: Never true  Transportation Needs: No Transportation Needs (10/14/2022)   PRAPARE - Administrator, Civil Service (Medical): No    Lack of Transportation (Non-Medical): No  Physical Activity: Not on file  Stress: Not on file  Social Connections: Not on file  Intimate Partner Violence: Not At Risk (10/14/2022)   Humiliation, Afraid, Rape, and Kick questionnaire    Fear of Current or Ex-Partner: No    Emotionally Abused: No    Physically Abused: No    Sexually Abused: No     No Known Allergies   CBC    Component Value Date/Time   WBC 6.7 12/01/2022 1042   RBC 3.71 (L) 12/01/2022 1042   RBC 3.69 (L) 12/01/2022 1042   HGB 9.4 (L) 12/01/2022 1042   HGB 12.6 09/08/2022 1047   HCT 30.0 (L) 12/01/2022 1042   PLT 447 (H) 12/01/2022 1042   PLT 221 09/08/2022 1047   MCV 81.3 12/01/2022 1042   MCH 25.5 (L) 12/01/2022 1042   MCHC 31.3 12/01/2022 1042   RDW 15.9 (H) 12/01/2022 1042   LYMPHSABS 1.7 12/01/2022 1042   MONOABS 0.6 12/01/2022 1042   EOSABS 0.1 12/01/2022 1042   BASOSABS 0.1 12/01/2022 1042    Pulmonary Functions Testing Results:     No data to display          Outpatient Medications Prior to Visit  Medication Sig Dispense Refill   acetaminophen (TYLENOL) 325 MG tablet Take 2 tablets (650 mg total) by mouth every 6 (six) hours as needed for mild pain or headache. 20 tablet 0   atorvastatin (LIPITOR) 40 MG tablet Take 40 mg by mouth daily.     BD INSULIN SYRINGE U/F 31G X 5/16" 0.3 ML MISC SMARTSIG:Injection 4 Times Daily 100 each 6   blood glucose meter kit and supplies Dispense based on patient and insurance preference. Use up to four times daily as directed. (FOR ICD-10 E10.9, E11.9). 1 each 0    calcitRIOL (ROCALTROL) 0.25 MCG capsule Take 1 capsule (0.25 mcg total) by mouth as directed. Take 1 capsule (0.25 mcg) three times a week. 60 capsule 2   cyanocobalamin (VITAMIN B12) 1000 MCG tablet Take 1 tablet (1,000 mcg total) by mouth daily. 30 tablet 2   famotidine (PEPCID) 20 MG tablet Take 20 mg by mouth daily as needed for indigestion or heartburn.     glucagon 1 MG injection Inject 1 mg into the muscle as needed.     hydrOXYzine (VISTARIL) 25 MG capsule Take 1 capsule (25 mg total) by mouth 3 (three) times daily as needed for anxiety. 60 capsule 2   Iron-Vitamin C 65-125 MG TABS Take 1 tablet by mouth daily. 30 tablet 2   Multiple Vitamin (MULTIVITAMIN WITH MINERALS) TABS tablet Take 1 tablet by mouth daily.     ondansetron (ZOFRAN-ODT) 4 MG disintegrating tablet Take 4 mg by mouth every 8 (eight) hours as needed for nausea.     oxyCODONE (OXY IR/ROXICODONE) 5 MG immediate release tablet Take 5 mg by mouth every 6 (six) hours as needed for breakthrough pain.     predniSONE (DELTASONE) 10 MG tablet 10 mg daily with breakfast.     sildenafil (REVATIO) 20 MG tablet Take 1 tablet (20 mg total) by mouth daily. 90 tablet 1   sulfamethoxazole-trimethoprim (BACTRIM DS) 800-160 MG tablet Take 1 tablet by mouth 3 (three) times a week. 45 tablet 0   tacrolimus (PROGRAF) 1 MG capsule Take 2 capsules (2 mg total) by mouth 2 (two) times  daily. 120 capsule 2   traZODone (DESYREL) 50 MG tablet Take 0.5-1 tablets (25-50 mg total) by mouth at bedtime as needed for sleep. 30 tablet 0   Vitamin D, Ergocalciferol, (DRISDOL) 1.25 MG (50000 UNIT) CAPS capsule Take 50,000 Units by mouth every 7 (seven) days.     warfarin (COUMADIN) 1 MG tablet Take 5 tablets (5 mg total) by mouth daily. 60 tablet 1   insulin aspart (NOVOLOG) 100 UNIT/ML injection Inject 5-8 Units into the skin 3 (three) times daily before meals. 7.2 mL 2   LANTUS 100 UNIT/ML injection Inject 0.15 mLs (15 Units total) into the skin at bedtime.  4.5 mL 2   torsemide (DEMADEX) 20 MG tablet Take 1 tablet (20 mg total) by mouth daily. (Patient not taking: Reported on 12/01/2022) 30 tablet 0   No facility-administered medications prior to visit.

## 2022-12-25 ENCOUNTER — Inpatient Hospital Stay: Payer: Medicaid Other

## 2022-12-25 ENCOUNTER — Telehealth: Payer: Self-pay

## 2022-12-25 ENCOUNTER — Other Ambulatory Visit: Payer: Self-pay

## 2022-12-25 DIAGNOSIS — I2609 Other pulmonary embolism with acute cor pulmonale: Secondary | ICD-10-CM

## 2022-12-25 DIAGNOSIS — I2699 Other pulmonary embolism without acute cor pulmonale: Secondary | ICD-10-CM | POA: Diagnosis not present

## 2022-12-25 LAB — PROTIME-INR
INR: 1.8 — ABNORMAL HIGH (ref 0.8–1.2)
Prothrombin Time: 20.8 seconds — ABNORMAL HIGH (ref 11.4–15.2)

## 2022-12-25 NOTE — Telephone Encounter (Signed)
-----   Message from Rickard Patience, MD sent at 12/25/2022  3:37 PM EDT ----- Recommend adjust coumadin to  Take 6mg  Mon, wed, Fri Sunday Take 5mg  Tues, thurs, sat  Repeat INR next week   zy

## 2022-12-25 NOTE — Telephone Encounter (Signed)
Called and spoke to patient's mother and informed her of the Coumadin dose/directions that Dr. Cathie Hoops recommends and that Dr. Cathie Hoops would like to repeat INR next week. Patient's mother gave verbal read back to dosage/directions and stated that she will inform patient of this information. I will get scheduling to schedule repeat INR for net week and contact mother/patient through MyChart,

## 2022-12-27 ENCOUNTER — Ambulatory Visit: Payer: Medicaid Other | Admitting: Family Medicine

## 2022-12-27 NOTE — Progress Notes (Unsigned)
I,Catherine [name],acting as a Neurosurgeon for [employer], MD.,have documented all relevant documentation on the behalf of Catherine Ramp, MD,as directed by  Catherine Ramp, MD while in the presence of Catherine Ramp, MD.   Complete physical exam   Patient: Catherine [name]   DOB: [birth year]   21 y.o. Female  MRN: [id number] Visit Date: 12/28/2022  Today's healthcare provider: Ronnald Ramp, MD   Chief Complaint  Patient presents with   Annual Exam   Subjective    Catherine [name] is a 22 y.o. female who presents today for a complete physical exam.  She reports consuming a general diet.  The patient reports going walking on a daily basis.  She generally feels well. She reports sleeping fairly well. She does have additional problems to discuss today.  HPI  Left foot Pain  Patient reports she was having some pain in her left foot and placed a lidocaine patch on it and she believes it made it raw/irritated. Reports it is located on the side of her foot and she used the patch 2 days ago.   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   Nephrotic syndrome 08/23/2019   Pulmonary embolism (HCC) 08/30/2022   Sepsis associated hypotension (HCC) 10/14/2022   Severe sepsis with septic shock (CODE) (HCC) 09/26/2021   Tobacco dependence    Past Surgical History:  Procedure Laterality Date   PULMONARY THROMBECTOMY Bilateral 08/22/2022   Procedure: PULMONARY THROMBECTOMY;  Surgeon: Annice Needy, MD;  Location: [facility];  Service: Cardiovascular;  Laterality: Bilateral;   Social History   Socioeconomic History   Marital status: Single    Spouse name: Not on file   Number of children: Not on file   Years of education: Not on file   Highest  education level: Not on file  Occupational History   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  Tobacco Use   Smoking status: Former    Types: E-cigarettes    Quit date: 07/2022    Years since quitting: 0.4   Smokeless tobacco: Never   Tobacco comments:    Quit substance use since the beginning of the year   Vaping Use   Vaping Use: Former  Substance and Sexual Activity   Alcohol use: Not Currently   Drug use: Yes    Types: Marijuana    Comment: Quit using marijuana approx earlier this year   Sexual activity: Not Currently  Other Topics Concern   Not on file  Social History Narrative   Not on file   Social Determinants of Health   Financial Resource Strain: Not on file  Food Insecurity: No Food Insecurity (10/14/2022)   Hunger Vital Sign    Worried About Running Out of Food in the Last Year: Never true    Ran Out of Food in the Last Year: Never true  Transportation Needs: No Transportation Needs (10/14/2022)   PRAPARE - Administrator, [name] Service (Medical): No    Lack of Transportation (Non-Medical): No  Physical Activity: Not on file  Stress: Not on file  Social Connections: Not on file  Intimate Partner Violence: Not At Risk (10/14/2022)   Humiliation, Afraid, Rape, and Kick questionnaire    Fear of Current or Ex-Partner: No    Emotionally Abused: No  Physically Abused: No    Sexually Abused: No   Family Status  Relation Name Status   Mother  Alive   Father  Alive   History reviewed. No pertinent family history. No Known Allergies  Patient Care Team: Catherine Ramp, MD as PCP - General (Family Medicine) Catherine Ramp, MD as PCP - Family Medicine (Family Medicine) Mosetta Pigeon, MD (Nephrology) Rickard Patience, MD as Consulting Physician (Oncology) Birdena Jubilee, MD as Consulting Physician (Internal Medicine) Burnis Kingfisher, MD as Consulting Physician (Endocrinology)   Medications: Outpatient Medications Prior to Visit   Medication Sig   acetaminophen (TYLENOL) 325 MG tablet Take 2 tablets (650 mg total) by mouth every 6 (six) hours as needed for mild pain or headache.   atorvastatin (LIPITOR) 40 MG tablet Take 40 mg by mouth daily.   BD INSULIN SYRINGE U/F 31G X 5/16" 0.3 ML MISC SMARTSIG:Injection 4 Times Daily   blood glucose meter kit and supplies Dispense based on patient and insurance preference. Use up to four times daily as directed. (FOR ICD-10 E10.9, E11.9).   calcitRIOL (ROCALTROL) 0.25 MCG capsule Take 1 capsule (0.25 mcg total) by mouth as directed. Take 1 capsule (0.25 mcg) three times a week.   cyanocobalamin (VITAMIN B12) 1000 MCG tablet Take 1 tablet (1,000 mcg total) by mouth daily.   famotidine (PEPCID) 20 MG tablet Take 20 mg by mouth daily as needed for indigestion or heartburn.   glucagon 1 MG injection Inject 1 mg into the muscle as needed.   hydrOXYzine (VISTARIL) 25 MG capsule Take 1 capsule (25 mg total) by mouth 3 (three) times daily as needed for anxiety.   insulin aspart (NOVOLOG) 100 UNIT/ML injection Inject 5-8 Units into the skin 3 (three) times daily before meals.   Iron-Vitamin C 65-125 MG TABS Take 1 tablet by mouth daily.   LANTUS 100 UNIT/ML injection Inject 0.15 mLs (15 Units total) into the skin at bedtime.   Multiple Vitamin (MULTIVITAMIN WITH MINERALS) TABS tablet Take 1 tablet by mouth daily.   ondansetron (ZOFRAN-ODT) 4 MG disintegrating tablet Take 4 mg by mouth every 8 (eight) hours as needed for nausea.   oxyCODONE (OXY IR/ROXICODONE) 5 MG immediate release tablet Take 5 mg by mouth every 6 (six) hours as needed for breakthrough pain.   predniSONE (DELTASONE) 10 MG tablet 10 mg daily with breakfast.   sildenafil (REVATIO) 20 MG tablet Take 1 tablet (20 mg total) by mouth daily.   sulfamethoxazole-trimethoprim (BACTRIM DS) 800-160 MG tablet Take 1 tablet by mouth 3 (three) times a week.   tacrolimus (PROGRAF) 1 MG capsule Take 2 capsules (2 mg total) by mouth 2 (two)  times daily.   torsemide (DEMADEX) 20 MG tablet Take 1 tablet (20 mg total) by mouth daily. (Patient not taking: Reported on 12/01/2022)   traZODone (DESYREL) 50 MG tablet Take 0.5-1 tablets (25-50 mg total) by mouth at bedtime as needed for sleep.   Vitamin D, Ergocalciferol, (DRISDOL) 1.25 MG (50000 UNIT) CAPS capsule Take 50,000 Units by mouth every 7 (seven) days.   warfarin (COUMADIN) 1 MG tablet Take 5 tablets (5 mg total) by mouth daily.   No facility-administered medications prior to visit.    Review of Systems    Objective    BP 115/75 (BP Location: Left Arm, Patient Position: Sitting, Cuff Size: Normal)   Pulse 87   Ht 4\' 11"  (1.499 m)   Wt 113 lb 4.8 oz (51.4 kg)   BMI 22.88 kg/m     Physical Exam  Vitals reviewed. Exam conducted with a chaperone present.  Constitutional:      General: She is not in acute distress.    Appearance: Normal appearance. She is not ill-appearing, toxic-appearing or diaphoretic.  HENT:     Head: Normocephalic and atraumatic.     Right Ear: Tympanic membrane and external ear normal. There is no impacted cerumen.     Left Ear: Tympanic membrane and external ear normal. There is no impacted cerumen.     Nose: Nose normal.     Mouth/Throat:     Pharynx: Oropharynx is clear.  Eyes:     General: No scleral icterus.    Extraocular Movements: Extraocular movements intact.     Conjunctiva/sclera: Conjunctivae normal.     Pupils: Pupils are equal, round, and reactive to light.  Cardiovascular:     Rate and Rhythm: Normal rate and regular rhythm.     Pulses: Normal pulses.     Heart sounds: Normal heart sounds. No murmur heard.    No friction rub. No gallop.  Pulmonary:     Effort: Pulmonary effort is normal. No respiratory distress.     Breath sounds: Normal breath sounds. No wheezing, rhonchi or rales.     Comments: Stable on room air Abdominal:     General: Bowel sounds are normal. There is no distension.     Palpations: Abdomen is soft.  There is no mass.     Tenderness: There is no abdominal tenderness. There is no guarding.  Genitourinary:    General: Normal vulva.     Pubic Area: No rash or pubic lice.      Labia:        Right: No rash, tenderness, lesion or injury.        Left: No rash, tenderness, lesion or injury.      Urethra: No urethral pain, urethral swelling or urethral lesion.     Vagina: No vaginal discharge, erythema, tenderness, bleeding or lesions.     Cervix: Normal.     Uterus: Normal.      Adnexa: Right adnexa normal and left adnexa normal.     Rectum: Normal.  Musculoskeletal:        General: No deformity.     Cervical back: Normal range of motion and neck supple. No rigidity.     Right lower leg: No edema.     Left lower leg: No edema.  Lymphadenopathy:     Cervical: No cervical adenopathy.  Skin:    General: Skin is warm.     Capillary Refill: Capillary refill takes less than 2 seconds.     Findings: No erythema or rash.  Neurological:     General: No focal deficit present.     Mental Status: She is alert and oriented to person, place, and time.     Motor: No weakness.     Gait: Gait normal.  Psychiatric:        Mood and Affect: Mood normal.        Behavior: Behavior normal.       Last depression screening scores    11/21/2022    1:22 PM 09/08/2022   11:05 AM  PHQ 2/9 Scores  PHQ - 2 Score 0   Exception Documentation  Patient refusal    Last fall risk screening    11/21/2022    1:22 PM  Fall Risk   Falls in the past year? 0  Number falls in past yr: 0  Injury with Fall? 0  Risk  for fall due to : No Fall Risks    Last Audit-C alcohol use screening    11/21/2022    1:23 PM  Alcohol Use Disorder Test (AUDIT)  1. How often do you have a drink containing alcohol? 0  2. How many drinks containing alcohol do you have on a typical day when you are drinking? 0  3. How often do you have six or more drinks on one occasion? 0  AUDIT-C Score 0   A score of 3 or more in women,  and 4 or more in men indicates increased risk for alcohol abuse, EXCEPT if all of the points are from question 1   No results found for any visits on 12/28/22.  Assessment & Plan    Routine Health Maintenance and Physical Exam  Immunization History  Administered Date(s) Administered   HPV 9-valent 10/07/2015   HPV Quadrivalent 04/12/2017   Hepatitis B, Dialysis 11/11/2019   Influenza, Seasonal, Injecte, Preservative Fre 04/18/2007, 05/12/2008   Influenza,inj,Quad PF,6+ Mos 05/13/2012, 04/07/2013, 06/11/2014, 05/10/2015, 04/12/2017   Influenza,inj,quad, With Preservative 04/17/2016   Influenza-Unspecified 05/01/2016   Pneumococcal Conjugate-13 01/14/2017   Pneumococcal Polysaccharide-23 11/11/2019    Health Maintenance  Topic Date Due   FOOT EXAM  Never done   OPHTHALMOLOGY EXAM  Never done   HPV VACCINES (3 - Risk 3-dose series) 08/13/2017   DTaP/Tdap/Td (1 - Tdap) Never done   PAP-Cervical Cytology Screening  Never done   PAP SMEAR-Modifier  Never done   COVID-19 Vaccine (1) 01/07/2023 (Originally 02/28/2005)   INFLUENZA VACCINE  02/01/2023   HEMOGLOBIN A1C  02/23/2023   Diabetic kidney evaluation - Urine ACR  09/04/2023   Diabetic kidney evaluation - eGFR measurement  10/18/2023   Hepatitis C Screening  Completed   HIV Screening  Completed    Problem List Items Addressed This Visit       Musculoskeletal and Integument   Contact dermatitis due to adhesives    Acute Left foot with small area of erythema on soles Recommended she apply Vaseline and wear soft socks with comfortable shoes and continue to monitor the area for any changes in redness or tenderness or the development of open sore/ulcer Diabetes foot exam was otherwise normal with 6/6 areas tested for monofilament assessment with normal findings         Other   Annual physical exam - Primary    Chronic conditions are stable  Patient was counseled on benefits of regular physical activity with goal of 150  minutes of moderate to vigurous intensity 4 days per week  Patient was counseled to consume well balanced diet of fruits, vegetables, limited saturated fats and limited sugary foods and beverages with emphasis on consuming 6-8 glasses of water daily  Screening recommended today: cervical cancer screening collected, DM foot exam completed         Relevant Orders   Cytology - PAP   RESOLVED: Screening for cervical cancer    Pap smear completed today        Return in about 2 months (around 03/06/2023) for CHRONIC F/U.       The entirety of the information documented in the History of Present Illness, Review of Systems and Physical Exam were personally obtained by me. Portions of this information were initially documented by Catherine [name],CMA. I, Catherine Ramp, MD have reviewed the documentation above for thoroughness and accuracy.      Catherine Ramp, MD  [facility] [phone] (phone) [phone] (fax)  [facility]

## 2022-12-28 ENCOUNTER — Encounter: Payer: Self-pay | Admitting: Family Medicine

## 2022-12-28 ENCOUNTER — Ambulatory Visit: Payer: Medicaid Other | Admitting: Family Medicine

## 2022-12-28 ENCOUNTER — Telehealth: Payer: Self-pay

## 2022-12-28 ENCOUNTER — Other Ambulatory Visit (HOSPITAL_COMMUNITY)
Admission: RE | Admit: 2022-12-28 | Discharge: 2022-12-28 | Disposition: A | Discharge status: Home / Self Care | Payer: Medicaid Other | Source: Ambulatory Visit | Attending: Family Medicine | Admitting: Family Medicine

## 2022-12-28 VITALS — BP 115/75 | HR 87 | Ht 59.0 in | Wt 113.3 lb

## 2022-12-28 DIAGNOSIS — Z0000 Encounter for general adult medical examination without abnormal findings: Secondary | ICD-10-CM | POA: Insufficient documentation

## 2022-12-28 DIAGNOSIS — Z124 Encounter for screening for malignant neoplasm of cervix: Secondary | ICD-10-CM | POA: Diagnosis not present

## 2022-12-28 DIAGNOSIS — L231 Allergic contact dermatitis due to adhesives: Secondary | ICD-10-CM | POA: Diagnosis not present

## 2022-12-28 NOTE — Patient Instructions (Addendum)
For your foot, please apply vaseline and wear comfortable socks. Make sure to exam the area of your foot daily for the next 7-10 days to make sure no sores develop. If this occurs, see me ASAP.     Health Maintenance, Female Adopting a healthy lifestyle and getting preventive care are important in promoting health and wellness. Ask your health care provider about: The right schedule for you to have regular tests and exams. Things you can do on your own to prevent diseases and keep yourself healthy. What should I know about diet, weight, and exercise? Eat a healthy diet  Eat a diet that includes plenty of vegetables, fruits, low-fat dairy products, and lean protein. Do not eat a lot of foods that are high in solid fats, added sugars, or sodium. Maintain a healthy weight Body mass index (BMI) is used to identify weight problems. It estimates body fat based on height and weight. Your health care provider can help determine your BMI and help you achieve or maintain a healthy weight. Get regular exercise Get regular exercise. This is one of the most important things you can do for your health. Most adults should: Exercise for at least 150 minutes each week. The exercise should increase your heart rate and make you sweat (moderate-intensity exercise). Do strengthening exercises at least twice a week. This is in addition to the moderate-intensity exercise. Spend less time sitting. Even light physical activity can be beneficial. Watch cholesterol and blood lipids Have your blood tested for lipids and cholesterol at 23 years of age, then have this test every 5 years. Have your cholesterol levels checked more often if: Your lipid or cholesterol levels are high. You are older than 23 years of age. You are at high risk for heart disease. What should I know about cancer screening? Depending on your health history and family history, you may need to have cancer screening at various ages. This may  include screening for: Breast cancer. Cervical cancer. Colorectal cancer. Skin cancer. Lung cancer. What should I know about heart disease, diabetes, and high blood pressure? Blood pressure and heart disease High blood pressure causes heart disease and increases the risk of stroke. This is more likely to develop in people who have high blood pressure readings or are overweight. Have your blood pressure checked: Every 3-5 years if you are 61-72 years of age. Every year if you are 49 years old or older. Diabetes Have regular diabetes screenings. This checks your fasting blood sugar level. Have the screening done: Once every three years after age 34 if you are at a normal weight and have a low risk for diabetes. More often and at a younger age if you are overweight or have a high risk for diabetes. What should I know about preventing infection? Hepatitis B If you have a higher risk for hepatitis B, you should be screened for this virus. Talk with your health care provider to find out if you are at risk for hepatitis B infection. Hepatitis C Testing is recommended for: Everyone born from [birth year] through 1966. Anyone with known risk factors for hepatitis C. Sexually transmitted infections (STIs) Get screened for STIs, including gonorrhea and chlamydia, if: You are sexually active and are younger than 23 years of age. You are older than 23 years of age and your health care provider tells you that you are at risk for this type of infection. Your sexual activity has changed since you were last screened, and you are at increased risk  for chlamydia or gonorrhea. Ask your health care provider if you are at risk. Ask your health care provider about whether you are at high risk for HIV. Your health care provider may recommend a prescription medicine to help prevent HIV infection. If you choose to take medicine to prevent HIV, you should first get tested for HIV. You should then be tested every 3 months  for as long as you are taking the medicine. Pregnancy If you are about to stop having your period (premenopausal) and you may become pregnant, seek counseling before you get pregnant. Take 400 to 800 micrograms (mcg) of folic acid every day if you become pregnant. Ask for birth control (contraception) if you want to prevent pregnancy. Osteoporosis and menopause Osteoporosis is a disease in which the bones lose minerals and strength with aging. This can result in bone fractures. If you are 62 years old or older, or if you are at risk for osteoporosis and fractures, ask your health care provider if you should: Be screened for bone loss. Take a calcium or vitamin D supplement to lower your risk of fractures. Be given hormone replacement therapy (HRT) to treat symptoms of menopause. Follow these instructions at home: Alcohol use Do not drink alcohol if: Your health care provider tells you not to drink. You are pregnant, may be pregnant, or are planning to become pregnant. If you drink alcohol: Limit how much you have to: 0-1 drink a day. Know how much alcohol is in your drink. In the [place] one drink equals one 12 oz bottle of beer (355 mL), one 5 oz glass of wine (148 mL), or one 1 oz glass of hard liquor (44 mL). Lifestyle Do not use any products that contain nicotine or tobacco. These products include cigarettes, chewing tobacco, and vaping devices, such as e-cigarettes. If you need help quitting, ask your health care provider. Do not use street drugs. Do not share needles. Ask your health care provider for help if you need support or information about quitting drugs. General instructions Schedule regular health, dental, and eye exams. Stay current with your vaccines. Tell your health care provider if: You often feel depressed. You have ever been abused or do not feel safe at home. Summary Adopting a healthy lifestyle and getting preventive care are important in promoting health and  wellness. Follow your health care provider's instructions about healthy diet, exercising, and getting tested or screened for diseases. Follow your health care provider's instructions on monitoring your cholesterol and blood pressure. This information is not intended to replace advice given to you by your health care provider. Make sure you discuss any questions you have with your health care provider. Document Revised: 11/08/2020 Document Reviewed: 11/08/2020 Elsevier Patient Education  2024 [employer].

## 2022-12-28 NOTE — Telephone Encounter (Signed)
Copied from CRM [id number]. Topic: General - Other >> Dec 28, 2022  1:16 PM Santiya F wrote: Reason for CRM: Pt is calling in because she needs a doctor's note sent to her through MyChart saying she was at her appointment today. Pt says she needs it when she returns to work tomorrow.

## 2022-12-28 NOTE — Telephone Encounter (Signed)
Note done and pt notified 

## 2022-12-28 NOTE — Assessment & Plan Note (Signed)
Acute Left foot with small area of erythema on soles Recommended she apply Vaseline and wear soft socks with comfortable shoes and continue to monitor the area for any changes in redness or tenderness or the development of open sore/ulcer Diabetes foot exam was otherwise normal with 6/6 areas tested for monofilament assessment with normal findings

## 2022-12-28 NOTE — Assessment & Plan Note (Signed)
Chronic conditions are stable  Patient was counseled on benefits of regular physical activity with goal of 150 minutes of moderate to vigurous intensity 4 days per week  Patient was counseled to consume well balanced diet of fruits, vegetables, limited saturated fats and limited sugary foods and beverages with emphasis on consuming 6-8 glasses of water daily  Screening recommended today: cervical cancer screening collected, DM foot exam completed

## 2022-12-28 NOTE — Assessment & Plan Note (Signed)
Pap smear completed today. 

## 2022-12-29 ENCOUNTER — Telehealth: Payer: Self-pay

## 2022-12-29 NOTE — Telephone Encounter (Signed)
Per coumadin clinic referral, VM was left but pt hasn't called back to schedule appt.   MyChart message sent to pt with coumadin clini number so she can contact them and set up appt.

## 2023-01-01 ENCOUNTER — Encounter: Payer: Self-pay | Admitting: Cardiology

## 2023-01-01 ENCOUNTER — Ambulatory Visit: Payer: Medicaid Other | Attending: Cardiology | Admitting: Cardiology

## 2023-01-01 ENCOUNTER — Inpatient Hospital Stay: Payer: Medicaid Other | Attending: Oncology

## 2023-01-01 ENCOUNTER — Telehealth: Payer: Self-pay

## 2023-01-01 VITALS — BP 118/88 | HR 85 | Ht 59.0 in | Wt 110.0 lb

## 2023-01-01 DIAGNOSIS — I2699 Other pulmonary embolism without acute cor pulmonale: Secondary | ICD-10-CM | POA: Diagnosis present

## 2023-01-01 DIAGNOSIS — I2609 Other pulmonary embolism with acute cor pulmonale: Secondary | ICD-10-CM

## 2023-01-01 DIAGNOSIS — Z79899 Other long term (current) drug therapy: Secondary | ICD-10-CM | POA: Diagnosis not present

## 2023-01-01 DIAGNOSIS — E782 Mixed hyperlipidemia: Secondary | ICD-10-CM | POA: Diagnosis not present

## 2023-01-01 DIAGNOSIS — I519 Heart disease, unspecified: Secondary | ICD-10-CM | POA: Diagnosis not present

## 2023-01-01 DIAGNOSIS — I2724 Chronic thromboembolic pulmonary hypertension: Secondary | ICD-10-CM | POA: Diagnosis not present

## 2023-01-01 LAB — LIPID PANEL
Cholesterol: 132 mg/dL (ref 0–200)
HDL: 58 mg/dL (ref >40)
LDL Cholesterol: 56 mg/dL (ref 0–99)
Total CHOL/HDL Ratio: 2.3 RATIO
Triglycerides: 90 mg/dL (ref <150)
VLDL: 18 mg/dL (ref 0–40)

## 2023-01-01 LAB — PROTIME-INR
INR: 1.6 — ABNORMAL HIGH (ref 0.8–1.2)
Prothrombin Time: 19.1 seconds — ABNORMAL HIGH (ref 11.4–15.2)

## 2023-01-01 NOTE — Telephone Encounter (Signed)
Please schedule lab on 7/8 @ 10:30am. Pt aware

## 2023-01-01 NOTE — Telephone Encounter (Signed)
-----   Message from Rickard Patience, MD sent at 01/01/2023  2:33 PM EDT ----- Recommend adjust coumadin to Take 6mg  Mon, Tues, Wed, Fri.Sat, Sunday Take 5mg  Thursdays.  Repeat INR next week

## 2023-01-01 NOTE — Progress Notes (Signed)
Cardiology Office Note:    Date:  01/01/2023   ID:  Catherine [name], DOB 03/20/2001, MRN [id number]  PCP:  Ronnald Ramp, MD   [facility] Providers Cardiologist:  Debbe Odea, MD     Referring MD: Rickard Patience, MD   Chief Complaint  Patient presents with   New Patient (Initial Visit)    Patient referred for anticoagulation management.  No cardiac history    History of Present Illness:    Catherine [name] is a 22 y.o. 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  Patient has a history of PE, diagnosed 08/22/2022, underwent mechanical thrombectomy and started on Eliquis.  Developed worsening shortness of breath, bilateral pulmonary embolism noted again.  Started on heparin at [facility], transferred to [facility] where pulmonary endarterectomy was performed for/2024.  Diagnosed with CTEPH, previously on Eliquis.  Followed up with hematology, Eliquis switched to Coumadin.  Follows up with pulmonary hypertension clinic at [facility].  Next appointment in 2 months.  Procedural TEE 10/26/2022 at [facility] showed normal EF over 55%.  Severe TR, moderate PR.  Severe RV dysfunction preprocedure, moderate TR, moderate RV dysfunction after procedure, no PFO.  LVEF normal both pre and postprocedure.  Patient overall feels well, denies chest pain or shortness of breath.  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   Nephrotic syndrome 08/23/2019   Pulmonary embolism (HCC) 08/30/2022   Sepsis associated hypotension (HCC) 10/14/2022   Severe sepsis with septic shock (CODE) (HCC) 09/26/2021   Tobacco dependence     Past Surgical History:  Procedure Laterality Date   PULMONARY  THROMBECTOMY Bilateral 08/22/2022   Procedure: PULMONARY THROMBECTOMY;  Surgeon: Annice Needy, MD;  Location: [facility] INVASIVE CV LAB;  Service: Cardiovascular;  Laterality: Bilateral;    Current Medications: Current Meds  Medication Sig   acetaminophen (TYLENOL) 325 MG tablet Take 2 tablets (650 mg total) by mouth every 6 (six) hours as needed for mild pain or headache.   atorvastatin (LIPITOR) 40 MG tablet Take 40 mg by mouth daily.   BD INSULIN SYRINGE U/F 31G X 5/16" 0.3 ML MISC SMARTSIG:Injection 4 Times Daily   blood glucose meter kit and supplies Dispense based on patient and insurance preference. Use up to four times daily as directed. (FOR ICD-10 E10.9, E11.9).   calcitRIOL (ROCALTROL) 0.25 MCG capsule Take 1 capsule (0.25 mcg total) by mouth as directed. Take 1 capsule (0.25 mcg) three times a week.   cyanocobalamin (VITAMIN B12) 1000 MCG tablet Take 1 tablet (1,000 mcg total) by mouth daily.   famotidine (PEPCID) 20 MG tablet Take 20 mg by mouth daily as needed for indigestion or heartburn.   glucagon 1 MG injection Inject 1 mg into the muscle as needed.   hydrOXYzine (VISTARIL) 25 MG capsule Take 1 capsule (25 mg total) by mouth 3 (three) times daily as needed for anxiety.   insulin aspart (NOVOLOG) 100 UNIT/ML injection Inject 5-8 Units into the skin 3 (three) times daily before meals.   Iron-Vitamin C 65-125 MG TABS Take 1 tablet by mouth daily.   LANTUS 100 UNIT/ML injection Inject 0.15 mLs (15 Units total) into the skin at bedtime.   Multiple Vitamin (MULTIVITAMIN WITH MINERALS) TABS tablet  Take 1 tablet by mouth daily.   ondansetron (ZOFRAN-ODT) 4 MG disintegrating tablet Take 4 mg by mouth every 8 (eight) hours as needed for nausea.   oxyCODONE (OXY IR/ROXICODONE) 5 MG immediate release tablet Take 5 mg by mouth every 6 (six) hours as needed for breakthrough pain.   sildenafil (REVATIO) 20 MG tablet Take 1 tablet (20 mg total) by mouth daily.   sulfamethoxazole-trimethoprim  (BACTRIM DS) 800-160 MG tablet Take 1 tablet by mouth 3 (three) times a week.   tacrolimus (PROGRAF) 1 MG capsule Take 2 capsules (2 mg total) by mouth 2 (two) times daily.   traZODone (DESYREL) 50 MG tablet Take 0.5-1 tablets (25-50 mg total) by mouth at bedtime as needed for sleep.   Vitamin D, Ergocalciferol, (DRISDOL) 1.25 MG (50000 UNIT) CAPS capsule Take 50,000 Units by mouth every 7 (seven) days.   warfarin (COUMADIN) 1 MG tablet Take 5 tablets (5 mg total) by mouth daily. (Patient taking differently: Take 5 mg by mouth daily. 6 mg Mon, Wed, Fri, Sun 5 Tu, Th, Sat)     Allergies:   Patient has no known allergies.   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  Tobacco Use   Smoking status: Former    Types: E-cigarettes    Quit date: 07/2022    Years since quitting: 0.4   Smokeless tobacco: Never   Tobacco comments:    Quit substance use since the beginning of the year   Vaping Use   Vaping Use: Former  Substance and Sexual Activity   Alcohol use: Not Currently   Drug use: Yes    Types: Marijuana    Comment: Quit using marijuana approx earlier this year   Sexual activity: Not Currently  Other Topics Concern   Not on file  Social History Narrative   Not on file   Social Determinants of Health   Financial Resource Strain: Not on file  Food Insecurity: No Food Insecurity (10/14/2022)   Hunger Vital Sign    Worried About Running Out of Food in the Last Year: Never true    Ran Out of Food in the Last Year: Never true  Transportation Needs: No Transportation Needs (10/14/2022)   PRAPARE - Administrator, Civil Service (Medical): No    Lack of Transportation (Non-Medical): No  Physical Activity: Not on file  Stress: Not on file  Social Connections:  Not on file     Family History: The patient's family history is not on file.  ROS:   Please see the history of present illness.     All other systems reviewed and are negative.  EKGs/Labs/Other Studies Reviewed:    The following studies were reviewed today:  EKG Interpretation Date/Time:  Monday January 01 2023 08:33:08 EDT Ventricular Rate:  85 PR Interval:  132 QRS Duration:  68 QT Interval:  390 QTC Calculation: 464 R Axis:   82  Text Interpretation: Normal sinus rhythm T wave abnormality, consider anterolateral ischemia Prolonged QT Confirmed by Debbe Odea ([id number]) on 01/01/2023 8:39:12 AM    Recent Labs: 10/13/2022: ALT 12; B Natriuretic Peptide 1,182.8 10/18/2022: BUN 47; Creatinine, Ser 1.35; Magnesium 1.8; Potassium 4.4; Sodium 133 12/01/2022: Hemoglobin 9.4; Platelets 447  Recent Lipid Panel  Component Value Date/Time   CHOL 382 (H) 09/06/2021 0500   TRIG 365 (H) 09/06/2021 0500   HDL 41 09/06/2021 0500   CHOLHDL 9.3 09/06/2021 0500   VLDL 73 (H) 09/06/2021 0500   LDLCALC 268 (H) 09/06/2021 0500     Risk Assessment/Calculations:             Physical Exam:    VS:  BP 118/88 (BP Location: Left Arm, Patient Position: Sitting, Cuff Size: Normal)   Pulse 85   Ht 4\' 11"  (1.499 m)   Wt 110 lb (49.9 kg)   SpO2 99%   BMI 22.22 kg/m     Wt Readings from Last 3 Encounters:  01/01/23 110 lb (49.9 kg)  12/28/22 113 lb 4.8 oz (51.4 kg)  12/22/22 109 lb 12.8 oz (49.8 kg)     GEN:  Well nourished, well developed in no acute distress HEENT: Normal NECK: No JVD; No carotid bruits LYMPHATICS: No lymphadenopathy CARDIAC: RRR, no murmurs, rubs, gallops RESPIRATORY:  Clear to auscultation without rales, wheezing or rhonchi  ABDOMEN: Soft, non-tender, non-distended MUSCULOSKELETAL:  No edema; No deformity  SKIN: Warm and dry NEUROLOGIC:  Alert and oriented x 3 PSYCHIATRIC:  Normal affect   ASSESSMENT:    1. CTEPH (chronic thromboembolic pulmonary  hypertension) (HCC)   2. Mixed hyperlipidemia   3. Dysfunction of right cardiac ventricle    PLAN:    In order of problems listed above:  CTEPH, follows up with pulmonary hypertension clinic at [facility], on sildenafil, keep appointment as scheduled.  Continue sildenafil, will obtain echocardiogram to evaluate RV pressures and RV function.  On Coumadin, INR monitored by hematology. Hyperlipidemia, on Lipitor 40 mg daily.  Obtain fasting lipid profile. Moderate RV dysfunction, echocardiogram as above.  If RV stays persistently reduced, consider referral to heart failure clinic.  Follow-up after echocardiogram.      Medication Adjustments/Labs and Tests Ordered: Current medicines are reviewed at length with the patient today.  Concerns regarding medicines are outlined above.  Orders Placed This Encounter  Procedures   Lipid panel   EKG 12-Lead   ECHOCARDIOGRAM COMPLETE   No orders of the defined types were placed in this encounter.   Patient Instructions  Medication Instructions:  The current medical regimen is effective;  continue present plan and medications.  *If you need a refill on your cardiac medications before your next appointment, please call your pharmacy*   Lab Work: LIPID (you can have this completed at the same time as your INR at the cancer center)   If you have labs (blood work) drawn today and your tests are completely normal, you will receive your results only by: MyChart Message (if you have MyChart) OR A paper copy in the mail If you have any lab test that is abnormal or we need to change your treatment, we will call you to review the results.   Testing/Procedures:  Echocardiogram - Your physician has requested that you have an echocardiogram. Echocardiography is a painless test that uses sound waves to create images of your heart. It provides your doctor with information about the size and shape of your heart and how well your heart's chambers and valves are  working. This procedure takes approximately one hour. There are no restrictions for this procedure.     Follow-Up: At [facility], you and your health needs are our priority.  As part of our continuing mission to provide you with exceptional heart care, we have created designated Provider Care Teams.  These Care Teams include your primary Cardiologist (physician) and Advanced Practice Providers (APPs -  Physician Assistants and Nurse Practitioners) who all work together to provide you with the care you need, when you need it.  We recommend signing up for the patient portal called "MyChart".  Sign up information is provided on this After Visit Summary.  MyChart is used to connect with patients for Virtual Visits (Telemedicine).  Patients are able to view lab/test results, encounter notes, upcoming appointments, etc.  Non-urgent messages can be sent to your provider as well.   To learn more about what you can do with MyChart, go to [email or web address].    Your next appointment:   2 month(s)  Provider:   Debbe Odea, MD       Signed, Debbe Odea, MD  01/01/2023 9:56 AM    [facility]

## 2023-01-01 NOTE — Patient Instructions (Addendum)
Medication Instructions:  The current medical regimen is effective;  continue present plan and medications.  *If you need a refill on your cardiac medications before your next appointment, please call your pharmacy*   Lab Work: LIPID (you can have this completed at the same time as your INR at the cancer center)   If you have labs (blood work) drawn today and your tests are completely normal, you will receive your results only by: MyChart Message (if you have MyChart) OR A paper copy in the mail If you have any lab test that is abnormal or we need to change your treatment, we will call you to review the results.   Testing/Procedures:  Echocardiogram - Your physician has requested that you have an echocardiogram. Echocardiography is a painless test that uses sound waves to create images of your heart. It provides your doctor with information about the size and shape of your heart and how well your heart's chambers and valves are working. This procedure takes approximately one hour. There are no restrictions for this procedure.     Follow-Up: At [facility], you and your health needs are our priority.  As part of our continuing mission to provide you with exceptional heart care, we have created designated Provider Care Teams.  These Care Teams include your primary Cardiologist (physician) and Advanced Practice Providers (APPs -  Physician Assistants and Nurse Practitioners) who all work together to provide you with the care you need, when you need it.  We recommend signing up for the patient portal called "MyChart".  Sign up information is provided on this After Visit Summary.  MyChart is used to connect with patients for Virtual Visits (Telemedicine).  Patients are able to view lab/test results, encounter notes, upcoming appointments, etc.  Non-urgent messages can be sent to your provider as well.   To learn more about what you can do with MyChart, go to [email or web address].     Your next appointment:   2 month(s)  Provider:   Debbe Odea, MD

## 2023-01-05 ENCOUNTER — Other Ambulatory Visit: Payer: Self-pay | Admitting: Oncology

## 2023-01-05 MED ORDER — WARFARIN SODIUM 2 MG PO TABS: 2.0000 mg | ORAL_TABLET | ORAL | 0 refills | Status: DC

## 2023-01-05 NOTE — Telephone Encounter (Signed)
How many tablets should I sent of 2mg  coumadin?

## 2023-01-08 ENCOUNTER — Inpatient Hospital Stay: Payer: Medicaid Other

## 2023-01-08 ENCOUNTER — Telehealth: Payer: Self-pay

## 2023-01-08 DIAGNOSIS — I2699 Other pulmonary embolism without acute cor pulmonale: Secondary | ICD-10-CM | POA: Diagnosis not present

## 2023-01-08 DIAGNOSIS — I2609 Other pulmonary embolism with acute cor pulmonale: Secondary | ICD-10-CM

## 2023-01-08 LAB — PROTIME-INR
INR: 1.6 — ABNORMAL HIGH (ref 0.8–1.2)
Prothrombin Time: 19 seconds — ABNORMAL HIGH (ref 11.4–15.2)

## 2023-01-08 NOTE — Telephone Encounter (Signed)
Called patient and informed her to take 7mg  daily on 7/8 and 7/9 and then starting on 7/10 6mg  daily. Also informed her that Dr.Yu would like to repeat INR on 7/11 or 7/12. Patient prefers 7/11 after 2:00pm.

## 2023-01-08 NOTE — Telephone Encounter (Signed)
-----   Message from Rickard Patience, MD sent at 01/08/2023  1:17 PM EDT ----- Please advise patient to take 7mg  daily for 7/8,7/9,  Starting 7/10 6mg  daily  Repeat INR 7/11 or 7/12

## 2023-01-09 ENCOUNTER — Other Ambulatory Visit: Payer: Self-pay

## 2023-01-09 DIAGNOSIS — I2609 Other pulmonary embolism with acute cor pulmonale: Secondary | ICD-10-CM

## 2023-01-09 LAB — CYTOLOGY - PAP
Chlamydia: NEGATIVE
Comment: NEGATIVE
Comment: NEGATIVE
Comment: NORMAL
Neisseria Gonorrhea: NEGATIVE
Trichomonas: NEGATIVE

## 2023-01-11 ENCOUNTER — Inpatient Hospital Stay: Payer: Medicaid Other

## 2023-01-11 DIAGNOSIS — I2609 Other pulmonary embolism with acute cor pulmonale: Secondary | ICD-10-CM

## 2023-01-11 DIAGNOSIS — I2699 Other pulmonary embolism without acute cor pulmonale: Secondary | ICD-10-CM | POA: Diagnosis not present

## 2023-01-11 LAB — PROTIME-INR
INR: 2.1 — ABNORMAL HIGH (ref 0.8–1.2)
Prothrombin Time: 23.5 seconds — ABNORMAL HIGH (ref 11.4–15.2)

## 2023-01-12 ENCOUNTER — Ambulatory Visit: Payer: Self-pay | Admitting: *Deleted

## 2023-01-12 ENCOUNTER — Other Ambulatory Visit: Payer: Self-pay

## 2023-01-12 ENCOUNTER — Telehealth: Payer: Self-pay

## 2023-01-12 DIAGNOSIS — I2609 Other pulmonary embolism with acute cor pulmonale: Secondary | ICD-10-CM

## 2023-01-12 NOTE — Telephone Encounter (Signed)
-----   Message from Rickard Patience sent at 01/11/2023  9:32 PM EDT ----- Please advise patient to continue Coumadin 6 mg daily. Please arrange patient to follow-up in 3 months, lab MD-CBC, CMP, iron TIBC ferritin. Please also arrange her to repeat INR in 2 weeks if she has not established with Coumadin clinic by then.  Thank you

## 2023-01-12 NOTE — Telephone Encounter (Signed)
  Chief Complaint: rash to face Symptoms: dry skin patches to bilateral cheeks and under chin  moderate itching  size of eraser head  Frequency: 2 weeks  Pertinent Negatives: Patient denies fever no redness no bumps Disposition: [] ED /[] Urgent Care (no appt availability in office) / [x] Appointment(In office/virtual)/ []  [facility]/ [] Home Care/ [] Refused Recommended Disposition /[] [facility]/ []  Follow-up with PCP Additional Notes:   Appt 01/15/23   Reason for Disposition  Localized rash present > 7 days  Answer Assessment - Initial Assessment Questions 1. APPEARANCE of RASH: "Describe the rash."      Dry skin patches 2. LOCATION: "Where is the rash located?"      Bilateral cheeks and under chin 3. NUMBER: "How many spots are there?"      3 4. SIZE: "How big are the spots?" (Inches, centimeters or compare to size of a coin)      Size of eraser head  5. ONSET: "When did the rash start?"      2 weeks  6. ITCHING: "Does the rash itch?" If Yes, ask: "How bad is the itch?"  (Scale 0-10; or none, mild, moderate, severe)     Moderate  7. PAIN: "Does the rash hurt?" If Yes, ask: "How bad is the pain?"  (Scale 0-10; or none, mild, moderate, severe)    - NONE (0): no pain    - MILD (1-3): doesn't interfere with normal activities     - MODERATE (4-7): interferes with normal activities or awakens from sleep     - SEVERE (8-10): excruciating pain, unable to do any normal activities     No pain  8. OTHER SYMPTOMS: "Do you have any other symptoms?" (e.g., fever)     No  9. PREGNANCY: "Is there any chance you are pregnant?" "When was your last menstrual period?"     na  Protocols used: Rash or Redness - Localized-A-AH

## 2023-01-12 NOTE — Telephone Encounter (Signed)
Called and spoke to patient's mother and informed her that patient should take Coumadon 6 mg daily per Dr. Cathie Hoops. I also informed mother that Dr. Cathie Hoops would like to follow-up in 3 months with labs. I asked mother if patient has established care with coumadin clinic and she states that she does not think so. I told her that in that case Dr. Cathie Hoops would like for patient to repeat PT/INR in 2 weeks. I will have scheduling schedule these appointments and notify patient through MyChart.

## 2023-01-15 ENCOUNTER — Encounter: Payer: Self-pay | Admitting: Family Medicine

## 2023-01-15 ENCOUNTER — Ambulatory Visit: Payer: Medicaid Other | Admitting: Family Medicine

## 2023-01-15 VITALS — BP 113/70 | HR 87 | Ht 59.0 in | Wt 115.3 lb

## 2023-01-15 DIAGNOSIS — R21 Rash and other nonspecific skin eruption: Secondary | ICD-10-CM

## 2023-01-15 MED ORDER — HYDROCORTISONE 2.5 % EX OINT
TOPICAL_OINTMENT | Freq: Two times a day (BID) | CUTANEOUS | 0 refills | Status: AC
Start: 2023-01-15 — End: ?

## 2023-01-15 NOTE — Progress Notes (Signed)
Established patient visit   Patient: Catherine [name]   DOB: 06-17-2001   21 y.o. Female  MRN: [id number] Visit Date: 01/15/2023  Today's healthcare provider: Ronnald Ramp, MD   Chief Complaint  Patient presents with   Rash   Subjective     HPI   Patient reports rash on her face and left arm. Patient reports first noticing 2 weeks ago on her face (cheeks and underneath her chin) associated with itching. Reports she though it would go away but now she is noticing it on her left arm as of 2-3 days ago. No swelling or redness associated. Patient reports she has used alcohol on both areas and it helped with the itching some. Patient reports nothing new since rash has appeared.  Last edited by Acey Lav, CMA on 01/15/2023  2:19 PM.      Face Rash  Discussed the use of AI scribe software for clinical note transcription with the patient, who gave verbal consent to proceed.  History of Present Illness   The patient presents with a two-week history of an itchy rash on the face, extending to the chin and arms. The rash has not responded to the patient's usual skincare regimen, which includes the use of Aquaphor. The patient denies any recent changes in medication, soap, detergent, or makeup use. The patient has not noticed anyone else in her household developing a similar rash. The patient has been spending time outdoors, but denies any correlation between sun exposure and the onset or worsening of the rash. The patient denies any associated oral or genital symptoms. The patient has been using over-the-counter 1% hydrocortisone cream for symptom management.       Medications: Outpatient Medications Prior to Visit  Medication Sig   acetaminophen (TYLENOL) 325 MG tablet Take 2 tablets (650 mg total) by mouth every 6 (six) hours as needed for mild pain or headache.   atorvastatin (LIPITOR) 40 MG tablet Take 40 mg by mouth daily.   BD INSULIN SYRINGE U/F 31G X 5/16" 0.3  ML MISC SMARTSIG:Injection 4 Times Daily   blood glucose meter kit and supplies Dispense based on patient and insurance preference. Use up to four times daily as directed. (FOR ICD-10 E10.9, E11.9).   calcitRIOL (ROCALTROL) 0.25 MCG capsule Take 1 capsule (0.25 mcg total) by mouth as directed. Take 1 capsule (0.25 mcg) three times a week.   cyanocobalamin (VITAMIN B12) 1000 MCG tablet Take 1 tablet (1,000 mcg total) by mouth daily.   famotidine (PEPCID) 20 MG tablet Take 20 mg by mouth daily as needed for indigestion or heartburn.   glucagon 1 MG injection Inject 1 mg into the muscle as needed.   hydrOXYzine (VISTARIL) 25 MG capsule Take 1 capsule (25 mg total) by mouth 3 (three) times daily as needed for anxiety.   Iron-Vitamin C 65-125 MG TABS Take 1 tablet by mouth daily.   Multiple Vitamin (MULTIVITAMIN WITH MINERALS) TABS tablet Take 1 tablet by mouth daily.   ondansetron (ZOFRAN-ODT) 4 MG disintegrating tablet Take 4 mg by mouth every 8 (eight) hours as needed for nausea.   oxyCODONE (OXY IR/ROXICODONE) 5 MG immediate release tablet Take 5 mg by mouth every 6 (six) hours as needed for breakthrough pain.   predniSONE (DELTASONE) 10 MG tablet 10 mg daily with breakfast.   sildenafil (REVATIO) 20 MG tablet Take 1 tablet (20 mg total) by mouth daily.   sulfamethoxazole-trimethoprim (BACTRIM DS) 800-160 MG tablet Take 1 tablet by mouth 3 (  three) times a week.   tacrolimus (PROGRAF) 1 MG capsule Take 2 capsules (2 mg total) by mouth 2 (two) times daily.   traZODone (DESYREL) 50 MG tablet Take 0.5-1 tablets (25-50 mg total) by mouth at bedtime as needed for sleep.   Vitamin D, Ergocalciferol, (DRISDOL) 1.25 MG (50000 UNIT) CAPS capsule Take 50,000 Units by mouth every 7 (seven) days.   warfarin (COUMADIN) 2 MG tablet Take 1 tablet (2 mg total) by mouth See admin instructions. Take 6mg  on Mondays, Tuesdays, Wednesdays, Fridays,Saturdays, Sundays, Take 5mg  on Thursdays.   insulin aspart (NOVOLOG) 100  UNIT/ML injection Inject 5-8 Units into the skin 3 (three) times daily before meals.   LANTUS 100 UNIT/ML injection Inject 0.15 mLs (15 Units total) into the skin at bedtime.   torsemide (DEMADEX) 20 MG tablet Take 1 tablet (20 mg total) by mouth daily. (Patient not taking: Reported on 12/01/2022)   No facility-administered medications prior to visit.    Review of Systems       Objective    BP 113/70 (BP Location: Left Arm, Patient Position: Sitting, Cuff Size: Normal)   Pulse 87   Ht 4\' 11"  (1.499 m)   Wt 115 lb 4.8 oz (52.3 kg)   SpO2 100%   BMI 23.29 kg/m      Physical Exam  Physical Exam   SKIN: Rash present on face and left arm, described as itchy. No rash noted on neck, chest, or back.       No results found for any visits on 01/15/23.  Assessment & Plan         Facial and Arm Rash: Itchy rash on face and arms for two weeks. No new medications, soaps, or detergents. No similar rash in close contacts. Possible dermatitis or fungal infection. Acute -Continue Aquaphor for moisturization. -Start over-the-counter 1% hydrocortisone cream twice daily. -If no improvement, use prescribed 2.5% hydrocortisone cream. -Return for follow-up if rash persists after two weeks of treatment.  General Health Maintenance: Noted sun exposure without sunscreen use. -Advised to use sunscreen when outdoors for extended periods.         Return in about 2 weeks (around 01/29/2023), or if symptoms worsen or fail to improve.         Ronnald Ramp, MD  [facility] [phone] (phone) [phone] (fax)  [facility]

## 2023-01-15 NOTE — Patient Instructions (Signed)
VISIT SUMMARY:  During your visit, we discussed the itchy rash on your face and arms that you've been experiencing for the past two weeks. We considered possible causes such as dermatitis or a fungal infection. We also discussed your general health, particularly your exposure to the sun without using sunscreen.  YOUR PLAN:  -FACIAL AND ARM RASH: This is a skin condition that's causing itching and discomfort. We're not sure of the exact cause yet, but it could be dermatitis (skin inflammation) or a fungal infection. You should continue using Aquaphor for moisturization and start applying an over-the-counter 1% hydrocortisone cream twice daily. If there's no improvement, use the prescribed 2.5% hydrocortisone cream. If the rash persists after two weeks of treatment, please return for a follow-up.  -SUN EXPOSURE: You've been spending time outdoors without using sunscreen. This can lead to sunburn and long-term skin damage. It's important to use sunscreen when you're outside for extended periods.  INSTRUCTIONS:  Please continue using Aquaphor and start applying the 1% hydrocortisone cream twice daily. If there's no improvement, switch to the prescribed 2.5% hydrocortisone cream. If the rash persists after two weeks, please return for a follow-up. Also, remember to use sunscreen when you're outdoors for extended periods.

## 2023-01-22 ENCOUNTER — Ambulatory Visit: Payer: Medicaid Other | Attending: Cardiology

## 2023-01-22 DIAGNOSIS — I2724 Chronic thromboembolic pulmonary hypertension: Secondary | ICD-10-CM

## 2023-01-22 DIAGNOSIS — I081 Rheumatic disorders of both mitral and tricuspid valves: Secondary | ICD-10-CM | POA: Diagnosis not present

## 2023-01-23 LAB — ECHOCARDIOGRAM COMPLETE
Area-P 1/2: 3.6 cm2
S' Lateral: 2.9 cm

## 2023-01-29 ENCOUNTER — Inpatient Hospital Stay: Payer: Medicaid Other

## 2023-01-29 DIAGNOSIS — I2699 Other pulmonary embolism without acute cor pulmonale: Secondary | ICD-10-CM | POA: Diagnosis not present

## 2023-01-29 DIAGNOSIS — I2609 Other pulmonary embolism with acute cor pulmonale: Secondary | ICD-10-CM

## 2023-01-29 LAB — PROTIME-INR
INR: 1.2 (ref 0.8–1.2)
Prothrombin Time: 15.4 seconds — ABNORMAL HIGH (ref 11.4–15.2)

## 2023-01-30 ENCOUNTER — Telehealth: Payer: Self-pay

## 2023-01-30 NOTE — Telephone Encounter (Signed)
-----   Message from Rickard Patience sent at 01/29/2023 11:22 PM EDT ----- Please clarify with patient to see if she has been taking coumadin 6mg  daily recently? Any skipped doses? Any new medication, antibiotics?

## 2023-01-30 NOTE — Telephone Encounter (Signed)
FYI, she has been taking coumadin as prescribed and no skipped doses or new medication, per pt

## 2023-01-31 ENCOUNTER — Telehealth: Payer: Self-pay | Admitting: Family Medicine

## 2023-01-31 ENCOUNTER — Other Ambulatory Visit: Payer: Self-pay

## 2023-01-31 ENCOUNTER — Ambulatory Visit: Payer: Medicaid Other | Admitting: Family Medicine

## 2023-01-31 ENCOUNTER — Encounter: Payer: Self-pay | Admitting: Family Medicine

## 2023-01-31 VITALS — BP 121/85 | HR 81 | Temp 98.2°F | Resp 12 | Ht 59.0 in | Wt 114.7 lb

## 2023-01-31 DIAGNOSIS — R21 Rash and other nonspecific skin eruption: Secondary | ICD-10-CM | POA: Diagnosis not present

## 2023-01-31 DIAGNOSIS — I2609 Other pulmonary embolism with acute cor pulmonale: Secondary | ICD-10-CM

## 2023-01-31 MED ORDER — ADAPALENE 0.1 % EX GEL: Freq: Every day | CUTANEOUS | 0 refills | Status: DC

## 2023-01-31 NOTE — Telephone Encounter (Signed)
I do not plan to manage patient's INR and coumadin. Would prefer she continue to follow with specialist.

## 2023-01-31 NOTE — Telephone Encounter (Signed)
Received message from [place] with [facility] family practice, informing us that Dr. Neita Garnet would prefer for specialist to continue to monitor INR/ coumadin.

## 2023-01-31 NOTE — Assessment & Plan Note (Signed)
Acute new issue  Minimally responsive to topical steroids, hydrocortisone 1 and 2.5%  Recommended using adapalene every evening  Counseled on importance of avoiding excessive sun exposure while using this product  Patient voiced understanding

## 2023-01-31 NOTE — Patient Instructions (Addendum)
Please start applying the Adapalene topical to your face in the evening before bed.   Make sure to wear hats during prolonged sun exposure as this can increase chance of skin discoloration while using this agent.   This may cause dryness of the skin so I recommend a gentle moisturizer for the face followed by a small amount of aquaphor   Moisturizer: Cerave Aveeno Eucerin

## 2023-01-31 NOTE — Telephone Encounter (Signed)
Elizabeth from [facility] called in states, Dr Bonita Quin wants to know if Dr Sharol Harness is gonna manage pt's INR and coumadin med.

## 2023-01-31 NOTE — Telephone Encounter (Signed)
Please schedule Lab (INR) on 8/2 @2 :30p.

## 2023-01-31 NOTE — Progress Notes (Signed)
Established patient visit   Patient: Catherine [name]   DOB: 2000-12-04   22 y.o. Female  MRN: [id number] Visit Date: 01/31/2023  Today's healthcare provider: Ronnald Ramp, MD   Chief Complaint  Patient presents with   Rash    She reports rash is better.    Subjective     HPI     Rash    Additional comments: She reports rash is better.       Last edited by Myles Lipps, CMA on 01/31/2023  2:28 PM.      Rash has been improved on her extremities but she still has some areas of fine papules on her face but they are no longer pruritic    Medications: Outpatient Medications Prior to Visit  Medication Sig   acetaminophen (TYLENOL) 325 MG tablet Take 2 tablets (650 mg total) by mouth every 6 (six) hours as needed for mild pain or headache.   atorvastatin (LIPITOR) 40 MG tablet Take 40 mg by mouth daily.   BD INSULIN SYRINGE U/F 31G X 5/16" 0.3 ML MISC SMARTSIG:Injection 4 Times Daily   blood glucose meter kit and supplies Dispense based on patient and insurance preference. Use up to four times daily as directed. (FOR ICD-10 E10.9, E11.9).   calcitRIOL (ROCALTROL) 0.25 MCG capsule Take 1 capsule (0.25 mcg total) by mouth as directed. Take 1 capsule (0.25 mcg) three times a week.   cyanocobalamin (VITAMIN B12) 1000 MCG tablet Take 1 tablet (1,000 mcg total) by mouth daily.   famotidine (PEPCID) 20 MG tablet Take 20 mg by mouth daily as needed for indigestion or heartburn.   glucagon 1 MG injection Inject 1 mg into the muscle as needed.   hydrocortisone 2.5 % ointment Apply topically 2 (two) times daily.   hydrOXYzine (VISTARIL) 25 MG capsule Take 1 capsule (25 mg total) by mouth 3 (three) times daily as needed for anxiety.   insulin aspart (NOVOLOG) 100 UNIT/ML injection Inject 5-8 Units into the skin 3 (three) times daily before meals.   Iron-Vitamin C 65-125 MG TABS Take 1 tablet by mouth daily.   LANTUS 100 UNIT/ML injection Inject 0.15 mLs (15  Units total) into the skin at bedtime.   Multiple Vitamin (MULTIVITAMIN WITH MINERALS) TABS tablet Take 1 tablet by mouth daily.   ondansetron (ZOFRAN-ODT) 4 MG disintegrating tablet Take 4 mg by mouth every 8 (eight) hours as needed for nausea.   oxyCODONE (OXY IR/ROXICODONE) 5 MG immediate release tablet Take 5 mg by mouth every 6 (six) hours as needed for breakthrough pain.   predniSONE (DELTASONE) 10 MG tablet 10 mg as needed.   sildenafil (REVATIO) 20 MG tablet Take 1 tablet (20 mg total) by mouth daily.   sulfamethoxazole-trimethoprim (BACTRIM DS) 800-160 MG tablet Take 1 tablet by mouth 3 (three) times a week.   tacrolimus (PROGRAF) 1 MG capsule Take 2 capsules (2 mg total) by mouth 2 (two) times daily.   torsemide (DEMADEX) 20 MG tablet Take 1 tablet (20 mg total) by mouth daily. (Patient taking differently: Take 20 mg by mouth as needed.)   traZODone (DESYREL) 50 MG tablet Take 0.5-1 tablets (25-50 mg total) by mouth at bedtime as needed for sleep.   Vitamin D, Ergocalciferol, (DRISDOL) 1.25 MG (50000 UNIT) CAPS capsule Take 50,000 Units by mouth every 7 (seven) days.   warfarin (COUMADIN) 2 MG tablet Take 1 tablet (2 mg total) by mouth See admin instructions. Take 6mg  on Mondays, Tuesdays, Wednesdays, Fridays,Saturdays, Sundays,  Take 5mg  on Thursdays.   No facility-administered medications prior to visit.    Review of Systems       Objective    BP 121/85 (BP Location: Left Arm, Patient Position: Sitting, Cuff Size: Normal)   Pulse 81   Temp 98.2 F (36.8 C) (Temporal)   Resp 12   Ht 4\' 11"  (1.499 m)   Wt 114 lb 11.2 oz (52 kg)   BMI 23.17 kg/m      Physical Exam  Skin: papules on bilateral cheeks without erythema, evidence of dry skin, no bleeding or drainage noted, bilateral forearms appear to have improved rash with minimal skin inflammation   No results found for any visits on 01/31/23.  Assessment & Plan     Problem List Items Addressed This Visit     Facial  rash - Primary    Acute new issue  Minimally responsive to topical steroids, hydrocortisone 1 and 2.5%  Recommended using adapalene every evening  Counseled on importance of avoiding excessive sun exposure while using this product  Patient voiced understanding        No follow-ups on file.         Ronnald Ramp, MD  [facility] [phone] (phone) [phone] (fax)  [facility]

## 2023-01-31 NOTE — Telephone Encounter (Signed)
Called [employer] and spoke to [place]. Called ot see if PCP is willing to continue to manage INR/ coumadin dosage. She has forwarded message to nurse. Ph number provided for callback with response.

## 2023-01-31 NOTE — Telephone Encounter (Signed)
Lanora Manis advised and reports she will make Dr.You aware

## 2023-02-02 ENCOUNTER — Telehealth: Payer: Self-pay

## 2023-02-02 ENCOUNTER — Inpatient Hospital Stay: Payer: Medicaid Other | Attending: Oncology

## 2023-02-02 DIAGNOSIS — I2699 Other pulmonary embolism without acute cor pulmonale: Secondary | ICD-10-CM | POA: Diagnosis present

## 2023-02-02 DIAGNOSIS — D509 Iron deficiency anemia, unspecified: Secondary | ICD-10-CM | POA: Insufficient documentation

## 2023-02-02 DIAGNOSIS — I2609 Other pulmonary embolism with acute cor pulmonale: Secondary | ICD-10-CM

## 2023-02-02 DIAGNOSIS — Z7901 Long term (current) use of anticoagulants: Secondary | ICD-10-CM | POA: Insufficient documentation

## 2023-02-02 DIAGNOSIS — N049 Nephrotic syndrome with unspecified morphologic changes: Secondary | ICD-10-CM | POA: Diagnosis not present

## 2023-02-02 LAB — PROTIME-INR
INR: 1.3 — ABNORMAL HIGH (ref 0.8–1.2)
Prothrombin Time: 16.7 seconds — ABNORMAL HIGH (ref 11.4–15.2)

## 2023-02-02 NOTE — Telephone Encounter (Signed)
-----   Message from Rickard Patience sent at 02/02/2023  2:41 PM EDT ----- Recommend her to take one dose of 10mg  coumadin today, then daily 8mg  coumadin.  Repeat INR stat on 02/05/23

## 2023-02-05 ENCOUNTER — Telehealth: Payer: Self-pay | Admitting: Family Medicine

## 2023-02-05 ENCOUNTER — Telehealth: Payer: Self-pay

## 2023-02-05 ENCOUNTER — Inpatient Hospital Stay: Payer: Medicaid Other

## 2023-02-05 DIAGNOSIS — R21 Rash and other nonspecific skin eruption: Secondary | ICD-10-CM

## 2023-02-05 DIAGNOSIS — I2609 Other pulmonary embolism with acute cor pulmonale: Secondary | ICD-10-CM

## 2023-02-05 DIAGNOSIS — I2699 Other pulmonary embolism without acute cor pulmonale: Secondary | ICD-10-CM | POA: Diagnosis not present

## 2023-02-05 LAB — PROTIME-INR
INR: 2.4 — ABNORMAL HIGH (ref 0.8–1.2)
Prothrombin Time: 26 seconds — ABNORMAL HIGH (ref 11.4–15.2)

## 2023-02-05 NOTE — Telephone Encounter (Signed)
Please move up appts in OCT to the week of 8/19 (on a lighter schedule day) . Please inform pt of appts.

## 2023-02-05 NOTE — Telephone Encounter (Signed)
Pt states that the medication that was called in for her: adapalene (ADAPALENE TREATMENT) 0.1 % gel  She is needing a Prior Authorization for the medication or is needing a different prescription called in for her. Please advise.

## 2023-02-05 NOTE — Telephone Encounter (Signed)
-----   Message from Rickard Patience sent at 02/05/2023  3:48 PM EDT ----- Continue coumadin 8mg  daily.  Please move her appt up to 2 weeks lab MD same day, cbc iron tibc ferritin  INR  zy

## 2023-02-07 ENCOUNTER — Other Ambulatory Visit: Payer: Self-pay | Admitting: Oncology

## 2023-02-07 MED ORDER — WARFARIN SODIUM 2 MG PO TABS: 8.0000 mg | ORAL_TABLET | Freq: Every day | ORAL | 0 refills | Status: DC

## 2023-02-12 ENCOUNTER — Other Ambulatory Visit: Payer: Self-pay | Admitting: Family Medicine

## 2023-02-12 MED ORDER — CLOTRIMAZOLE 1 % EX CREA
1.0000 | TOPICAL_CREAM | Freq: Two times a day (BID) | CUTANEOUS | 0 refills | Status: AC
Start: 2023-02-12 — End: ?

## 2023-02-12 NOTE — Telephone Encounter (Signed)
Please see which medicine is on the preferred list for medicaid patient's.

## 2023-02-12 NOTE — Progress Notes (Signed)
Rx updated for facial rash

## 2023-02-12 NOTE — Telephone Encounter (Signed)
New therapy sent to pharmacy to apply twice daily for 2 weeks.

## 2023-02-19 ENCOUNTER — Ambulatory Visit: Payer: Medicaid Other | Admitting: Oncology

## 2023-02-19 ENCOUNTER — Other Ambulatory Visit: Payer: Medicaid Other

## 2023-02-20 ENCOUNTER — Inpatient Hospital Stay: Payer: Medicaid Other

## 2023-02-20 ENCOUNTER — Encounter: Payer: Self-pay | Admitting: Oncology

## 2023-02-20 ENCOUNTER — Inpatient Hospital Stay (HOSPITAL_BASED_OUTPATIENT_CLINIC_OR_DEPARTMENT_OTHER): Payer: Medicaid Other | Admitting: Oncology

## 2023-02-20 VITALS — BP 107/71 | HR 91 | Temp 99.0°F | Resp 18 | Wt 115.6 lb

## 2023-02-20 DIAGNOSIS — I2609 Other pulmonary embolism with acute cor pulmonale: Secondary | ICD-10-CM | POA: Diagnosis not present

## 2023-02-20 DIAGNOSIS — N049 Nephrotic syndrome with unspecified morphologic changes: Secondary | ICD-10-CM

## 2023-02-20 DIAGNOSIS — I2699 Other pulmonary embolism without acute cor pulmonale: Secondary | ICD-10-CM | POA: Diagnosis not present

## 2023-02-20 DIAGNOSIS — D508 Other iron deficiency anemias: Secondary | ICD-10-CM | POA: Diagnosis not present

## 2023-02-20 LAB — CBC WITH DIFFERENTIAL (CANCER CENTER ONLY)
Abs Immature Granulocytes: 0.01 10*3/uL (ref 0.00–0.07)
Basophils Absolute: 0.1 10*3/uL (ref 0.0–0.1)
Basophils Relative: 1 %
Eosinophils Absolute: 0.2 10*3/uL (ref 0.0–0.5)
Eosinophils Relative: 3 %
HCT: 40.6 % (ref 36.0–46.0)
Hemoglobin: 13 g/dL (ref 12.0–15.0)
Immature Granulocytes: 0 %
Lymphocytes Relative: 31 %
Lymphs Abs: 2.1 10*3/uL (ref 0.7–4.0)
MCH: 25 pg — ABNORMAL LOW (ref 26.0–34.0)
MCHC: 32 g/dL (ref 30.0–36.0)
MCV: 78.1 fL — ABNORMAL LOW (ref 80.0–100.0)
Monocytes Absolute: 0.5 10*3/uL (ref 0.1–1.0)
Monocytes Relative: 8 %
Neutro Abs: 3.8 10*3/uL (ref 1.7–7.7)
Neutrophils Relative %: 57 %
Platelet Count: 373 10*3/uL (ref 150–400)
RBC: 5.2 MIL/uL — ABNORMAL HIGH (ref 3.87–5.11)
RDW: 17 % — ABNORMAL HIGH (ref 11.5–15.5)
WBC Count: 6.6 10*3/uL (ref 4.0–10.5)
nRBC: 0 % (ref 0.0–0.2)

## 2023-02-20 LAB — CMP (CANCER CENTER ONLY)
ALT: 26 U/L (ref 0–44)
AST: 29 U/L (ref 15–41)
Albumin: 4.2 g/dL (ref 3.5–5.0)
Alkaline Phosphatase: 113 U/L (ref 38–126)
Anion gap: 8 (ref 5–15)
BUN: 34 mg/dL — ABNORMAL HIGH (ref 6–20)
CO2: 18 mmol/L — ABNORMAL LOW (ref 22–32)
Calcium: 9.1 mg/dL (ref 8.9–10.3)
Chloride: 106 mmol/L (ref 98–111)
Creatinine: 1.12 mg/dL — ABNORMAL HIGH (ref 0.44–1.00)
GFR, Estimated: >60 mL/min (ref >60)
Glucose, Bld: 261 mg/dL — ABNORMAL HIGH (ref 70–99)
Potassium: 4.2 mmol/L (ref 3.5–5.1)
Sodium: 132 mmol/L — ABNORMAL LOW (ref 135–145)
Total Bilirubin: 0.4 mg/dL (ref 0.3–1.2)
Total Protein: 7.9 g/dL (ref 6.5–8.1)

## 2023-02-20 LAB — IRON AND TIBC
Iron: 50 ug/dL (ref 28–170)
Saturation Ratios: 12 % (ref 10.4–31.8)
TIBC: 435 ug/dL (ref 250–450)
UIBC: 385 ug/dL

## 2023-02-20 LAB — PROTIME-INR
INR: 1.9 — ABNORMAL HIGH (ref 0.8–1.2)
Prothrombin Time: 22.2 seconds — ABNORMAL HIGH (ref 11.4–15.2)

## 2023-02-20 LAB — FERRITIN: Ferritin: 28 ng/mL (ref 11–307)

## 2023-02-20 MED ORDER — WARFARIN SODIUM 2 MG PO TABS: 8.0000 mg | ORAL_TABLET | Freq: Every day | ORAL | 1 refills | Status: DC

## 2023-02-20 NOTE — Progress Notes (Signed)
Pt here for follow up. Reports that she is on Bactrim long-term. She says PCP is prescribing this "to fight off any infection". Did not specify if there was a specific infection.

## 2023-02-20 NOTE — Assessment & Plan Note (Addendum)
Lab Results  Component Value Date   HGB 13.0 02/20/2023   TIBC 435 02/20/2023   IRONPCTSAT 12 02/20/2023   FERRITIN 28 02/20/2023    Both hemoglobin and ferritin have improved.  She may stop iron supplementation.

## 2023-02-20 NOTE — Assessment & Plan Note (Signed)
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 Recommend patient to continue coumadin with goal of INR 2-3 Coumadin dosage has been adjusted to 8mg  due to recent start of bactrim Today's INR is 1.9, I recommend patient to take 10mg  daily coumadin today, and starting tomorrow, resume on daily 8mg .  Repeat INR in 4 weeks.

## 2023-02-20 NOTE — Progress Notes (Signed)
Hematology/Oncology Progress note [phone] [phone]           REFERRING PROVIDER: Brett Albino*   CHIEF COMPLAINTS/REASON FOR VISIT:  Follow up for bilateral pulmonary embolism, due to nephrotic syndrome.    ASSESSMENT & PLAN:   Pulmonary embolism (HCC) Pulm embolism due to hypercoagulability secondary to nephrotic syndrome/hypoalbuminemia She received mechanical embolectomy during last admission and had a recurrent pulmonary embolism despite taking Eliquis.  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 Recommend patient to continue coumadin with goal of INR 2-3 Coumadin dosage has been adjusted to 8mg  due to recent start of bactrim Today's INR is 1.9, I recommend patient to take 10mg  daily coumadin today, and starting tomorrow, resume on daily 8mg .  Repeat INR in 4 weeks.    Nephrotic syndrome On prograf now.  She takes bactrim 3 times per week for prophylaxis of pcp.   IDA (iron deficiency anemia) Lab Results  Component Value Date   HGB 13.0 02/20/2023   TIBC 435 02/20/2023   IRONPCTSAT 12 02/20/2023   FERRITIN 28 02/20/2023    Both hemoglobin and ferritin have improved.  She may stop iron supplementation.    Orders Placed This Encounter  Procedures   CMP (Cancer Center only)    Standing Status:   Future    Standing Expiration Date:   02/20/2024   CBC with Differential (Cancer Center Only)    Standing Status:   Future    Standing Expiration Date:   02/20/2024   Protime-INR    Standing Status:   Future    Standing Expiration Date:   02/20/2024   Follow up TBD All questions were answered. The patient knows to call the clinic with any problems, questions or concerns.  Rickard Patience, MD, PhD [facility] Hematology Oncology 02/20/2023   HISTORY OF PRESENTING ILLNESS:   Catherine [name] is a  22 y.o.  female presents for follow up of bilateral pulmonary  embolism 06/30/2022 - 07/05/2022 patient was admitted at [facility] due to AKI on CKD, proteinuria, hypoalbuminemia Ultrasound of bilateral lower extremity was negative for DVT. Patient was started on Eliquis 5 mg twice daily for prophylactic anticoagulation due to the concern of hypercoagulability secondary to hypoalbuminemia/nephrotic syndrome.  Patient took 30 days of Eliquis and ran out after that.   08/22/2022 - 08/25/2022, patient was hospitalized at [facility] due to shortness of breath, 08/22/2021, CT chest PE: Showed acute submassive PE with right heart strain.  Multifocal bilateral upper lobe nodules-favor infectious process.  Multifocal groundglass opacities with subpleural sparing likely reflects combination of pulmonary hemorrhage and/for any infectious or inflammatory etiology. Patient was treated with heparin drip, transition to Eliquis. She underwent thrombolysis and mechanical embolectomy by Dr. Wyn Quaker on 08/22/2022.   08/30/2022, patient returned to the emergency room due to shortness of breath. Repeat CT chest angiogram showed interval development of confluent patchy and posterior consolidation airspace disease in the right middle lobe suggesting infarct. Bulky bilateral pulmonary embolism again noted, occlusive in right middle lobe lobar branch and segmental branches to the right lower lobe, similar to prior. Medial segmental and subsegmental branches to the right lower lobe are patent on the current study despite being occluded previously. Small volume occlusive thrombus in a subsegmental right upper lobe branches similar to prior.  No change in the nonocclusive thrombus in the segmental branches to the left lower lobe.  Enlargement of pulmonary outflow tract/main pulmonary arteries.  Cavitary lesions in  the lateral and posterior right upper lobe.  Nodular and patchy areas of consolidative airspace disease Patient was restarted on heparin drip and was transitioned to Lovenox 1mg /kg BID bridging to coumadin  with INR goal 2-3  Cavitary lesion, Patient was seen by infectious disease physician for pulmonary infiltrates.  Pulmonologist does not recommend bronchoscopy due to high risk.   10/18/2022 - 11/05/2022.  Patient was hospitalized, initially at [facility] for septic shock presumably from pneumonia, she required Levophed vasopressor, covered with vancomycin and cefepime, azithromycin.  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 She tolerated procedure.  Patient was on heparin drip which bridged to Eliquis at discharge. She is currently taking Sildenafil   10/26/22 right superior segment wedge resection showed - Mild to moderate pulmonary arterial hypertension with focal intraluminal organized thrombi and recanalization; features consistent with thromboembolic pulmonary hypertension.  - Patchy interstitial fibrosis with associated intraalveolar macrophages. See comment. - Granulomatous inflammation not identified  She followed up with pulmonology and was seen by Dr.Dgayli on 11/17/2022. There was concern that the pathogen was not ideal choice for her. Dr.Dgayli communicated with with [facility] team and Dr.Rajagopal agrees that pulmonary is likely a better choice for her situation.  Patient today presents for transition to pulmonary.  She reports feeling well and be compliant on all her medications.  She is alone by herself today.  Her parents have been very involved in her care in the past.  I offered to call her parents during the visit and patient declined. She denies shortness of breath, chest pain.  She has also now establish care with primary care provider.  INTERVAL HISTORY Catherine [name] is  a 22 y.o. female who has above history reviewed by me today presents for follow up visit for recurrent bilateral pulmonary embolism.  She is on coumadin, reports compliance.  On Bactrim for pcp prophylaxis.  No new complaints.    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   Nephrotic syndrome 08/23/2019   Pulmonary embolism (HCC) 08/30/2022   Sepsis associated hypotension (HCC) 10/14/2022   Severe sepsis with septic shock (CODE) (HCC) 09/26/2021   Tobacco dependence     SURGICAL HISTORY: Past Surgical History:  Procedure Laterality Date   PULMONARY THROMBECTOMY Bilateral 08/22/2022   Procedure: PULMONARY THROMBECTOMY;  Surgeon: Annice Needy, MD;  Location: [facility] INVASIVE CV LAB;  Service: Cardiovascular;  Laterality: Bilateral;    SOCIAL HISTORY: 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  Tobacco Use   Smoking status: Former    Types: E-cigarettes    Quit date: 07/2022    Years since quitting: 0.6   Smokeless tobacco: Never   Tobacco comments:    Quit substance use since the beginning of the year   Vaping Use   Vaping status: Former  Substance and  Sexual Activity   Alcohol use: Not Currently   Drug use: Yes    Types: Marijuana    Comment: Quit using marijuana approx earlier this year   Sexual activity: Not Currently  Other Topics Concern   Not on file  Social History Narrative   Not on file   Social Determinants of Health   Financial Resource Strain: Patient Declined (10/18/2022)   Received from [facility], [facility]    Overall Financial Resource Strain (CARDIA)    Difficulty of Paying Living Expenses: Patient declined  Food Insecurity: Patient Declined (10/18/2022)   Received from [facility], [facility]   Hunger Vital Sign    Worried About Running Out of Food in the Last Year: Patient declined    Ran Out of Food in the Last Year: Patient declined  Transportation Needs: No Transportation Needs (10/19/2022)   Received from [facility], [facility]   PRAPARE - Transportation    In the past 12 months, has lack of transportation kept you from medical appointments or from getting medications?: No    Lack of Transportation (Non-Medical): No  Physical Activity: Not on file  Stress: Not on file  Social Connections: Not on file  Intimate Partner Violence: Not At Risk (10/14/2022)   Humiliation, Afraid, Rape, and Kick questionnaire    Fear of Current or Ex-Partner: No    Emotionally Abused: No    Physically Abused: No    Sexually Abused: No    FAMILY HISTORY: History reviewed. No pertinent family history.  ALLERGIES:  has No Known Allergies.  MEDICATIONS:  Current Outpatient Medications  Medication Sig Dispense Refill   acetaminophen (TYLENOL) 325 MG tablet Take 2 tablets (650 mg total) by mouth every 6 (six) hours as needed for mild pain or headache. 20 tablet 0   atorvastatin (LIPITOR) 40 MG tablet Take 40 mg by mouth daily.     BD INSULIN SYRINGE U/F 31G X 5/16" 0.3 ML MISC SMARTSIG:Injection 4 Times Daily 100 each 6   blood glucose meter kit and supplies Dispense based on patient and insurance preference. Use up to four times daily as directed. (FOR ICD-10 E10.9, E11.9). 1 each 0   calcitRIOL (ROCALTROL) 0.25 MCG capsule Take 1 capsule (0.25 mcg total) by mouth as directed. Take 1 capsule (0.25 mcg) three times a week. 60 capsule 2   clotrimazole (CLOTRIMAZOLE ANTI-FUNGAL) 1 % cream Apply 1 Application topically 2 (two) times daily.  30 g 0   cyanocobalamin (VITAMIN B12) 1000 MCG tablet Take 1 tablet (1,000 mcg total) by mouth daily. 30 tablet 2   famotidine (PEPCID) 20 MG tablet Take 20 mg by mouth daily as needed for indigestion or heartburn.     hydrocortisone 2.5 % ointment Apply topically 2 (two) times daily. 30 g 0   hydrOXYzine (VISTARIL) 25 MG capsule Take 1 capsule (25 mg total) by mouth 3 (three) times daily as needed for anxiety. 60 capsule 2   insulin aspart (NOVOLOG) 100 UNIT/ML injection Inject 5-8 Units into the skin 3 (three) times daily before meals. 7.2 mL 2   Iron-Vitamin C 65-125 MG TABS Take 1 tablet by mouth daily. 30 tablet 2   LANTUS 100 UNIT/ML injection Inject 0.15 mLs (15 Units total) into the skin at bedtime. 4.5 mL 2   Multiple Vitamin (MULTIVITAMIN WITH MINERALS) TABS tablet Take 1 tablet by mouth daily.     ondansetron (ZOFRAN-ODT) 4 MG disintegrating tablet Take  4 mg by mouth every 8 (eight) hours as needed for nausea.     oxyCODONE (OXY IR/ROXICODONE) 5 MG immediate release tablet Take 5 mg by mouth every 6 (six) hours as needed for breakthrough pain.     predniSONE (DELTASONE) 10 MG tablet 10 mg as needed.     sildenafil (REVATIO) 20 MG tablet Take 1 tablet (20 mg total) by mouth daily. 90 tablet 1   sulfamethoxazole-trimethoprim (BACTRIM DS) 800-160 MG tablet Take 1 tablet by mouth 3 (three) times a week. 45 tablet 0   tacrolimus (PROGRAF) 1 MG capsule Take 2 capsules (2 mg total) by mouth 2 (two) times daily. 120 capsule 2   torsemide (DEMADEX) 20 MG tablet Take 1 tablet (20 mg total) by mouth daily. (Patient taking differently: Take 20 mg by mouth as needed.) 30 tablet 0   traZODone (DESYREL) 50 MG tablet Take 0.5-1 tablets (25-50 mg total) by mouth at bedtime as needed for sleep. 30 tablet 0   Vitamin D, Ergocalciferol, (DRISDOL) 1.25 MG (50000 UNIT) CAPS capsule Take 50,000 Units by mouth every 7 (seven) days.     glucagon 1 MG injection Inject 1 mg into the muscle as needed. (Patient not  taking: Reported on 02/20/2023)     warfarin (COUMADIN) 2 MG tablet Take 4 tablets (8 mg total) by mouth daily. 120 tablet 1   No current facility-administered medications for this visit.    Review of Systems  Constitutional:  Negative for appetite change, chills, fatigue and fever.  HENT:   Negative for hearing loss and voice change.   Eyes:  Negative for eye problems.  Respiratory:  Negative for chest tightness and cough.   Cardiovascular:  Negative for chest pain.  Gastrointestinal:  Negative for abdominal distention, abdominal pain and blood in stool.  Endocrine: Negative for hot flashes.  Genitourinary:  Negative for difficulty urinating and frequency.   Musculoskeletal:  Negative for arthralgias.  Skin:  Negative for itching and rash.  Neurological:  Negative for extremity weakness.  Hematological:  Negative for adenopathy.  Psychiatric/Behavioral:  Negative for confusion.    PHYSICAL EXAMINATION:  Vitals:   02/20/23 1105  BP: 107/71  Pulse: 91  Resp: 18  Temp: 99 F (37.2 C)   Filed Weights   02/20/23 1105  Weight: 115 lb 9.6 oz (52.4 kg)    Physical Exam  LABORATORY DATA:  I have reviewed the data as listed    Latest Ref Rng & Units 02/20/2023   10:48 AM 12/01/2022   10:42 AM 10/18/2022    5:47 AM  CBC  WBC 4.0 - 10.5 K/uL 6.6  6.7  18.5   Hemoglobin 12.0 - 15.0 g/dL 57.8  9.4  46.9   Hematocrit 36.0 - 46.0 % 40.6  30.0  34.3   Platelets 150 - 400 K/uL 373  447  342       Latest Ref Rng & Units 02/20/2023   10:48 AM 10/18/2022    5:47 AM 10/17/2022    4:49 AM  CMP  Glucose 70 - 99 mg/dL 629  528  86   BUN 6 - 20 mg/dL 34  47  50   Creatinine 0.44 - 1.00 mg/dL 4.13  2.44  0.10   Sodium 135 - 145 mmol/L 132  133  137   Potassium 3.5 - 5.1 mmol/L 4.2  4.4  3.7   Chloride 98 - 111 mmol/L 106  108  108   CO2 22 - 32 mmol/L 18  20  22   Calcium 8.9 - 10.3 mg/dL 9.1  7.8  7.9   Total Protein 6.5 - 8.1 g/dL 7.9     Total Bilirubin 0.3 - 1.2 mg/dL 0.4      Alkaline Phos 38 - 126 U/L 113     AST 15 - 41 U/L 29     ALT 0 - 44 U/L 26         RADIOGRAPHIC STUDIES: I have personally reviewed the radiological images as listed and agreed with the findings in the report. ECHOCARDIOGRAM COMPLETE  Result Date: 01/23/2023    ECHOCARDIOGRAM REPORT   Patient Name:   Catherine [name] Date of Exam: 01/22/2023 Medical Rec #:  [id number]           Height:       59.0 in Accession #:    [id number]          Weight:       115.3 lb Date of Birth:  August 25, 2000           BSA:          1.459 m Patient Age:    21 years            BP:           118/88 mmHg Patient Gender: F                   HR:           77 bpm. Exam Location:  [place] Procedure: 2D Echo, 3D Echo, Cardiac Doppler, Color Doppler and Strain Analysis Indications:    CTEPH (chronic thromboembolic pulmonary hypertension) (HCC)                 [[id number]]                 ; I27.20 Pulmonary Hypertension  History:        Patient has prior history of Echocardiogram examinations, most                 recent 10/15/2022. Prior Cardiac Surgery, Pulmonary HTN; Risk                 Factors:Diabetes, Hypertension and Dyslipidemia.  Sonographer:    Ilda Mori RDCS, MHA Referring Phys: [id number] BRIAN AGBOR-ETANG IMPRESSIONS  1. Left ventricular ejection fraction, by estimation, is 60 to 65%. The left ventricle has normal function. The left ventricle has no regional wall motion abnormalities. Left ventricular diastolic parameters were normal. The average left ventricular global longitudinal strain is -17.1 %.  2. Right ventricular systolic function is normal. The right ventricular size is normal. Tricuspid regurgitation signal is inadequate for assessing PA pressure.  3. The mitral valve is normal in structure. Mild to moderate mitral valve regurgitation. No evidence of mitral stenosis.  4. The aortic valve is tricuspid. Aortic valve regurgitation is not visualized. No aortic stenosis is present.  5. The inferior vena cava is  normal in size with greater than 50% respiratory variability, suggesting right atrial pressure of 3 mmHg. FINDINGS  Left Ventricle: Left ventricular ejection fraction, by estimation, is 60 to 65%. The left ventricle has normal function. The left ventricle has no regional wall motion abnormalities. The average left ventricular global longitudinal strain is -17.1 %. The left ventricular internal cavity size was normal in size. There is no left ventricular hypertrophy. Left ventricular diastolic parameters were normal. Right Ventricle: The right ventricular size is normal. No increase in right ventricular wall thickness. Right  ventricular systolic function is normal. Tricuspid regurgitation signal is inadequate for assessing PA pressure. Left Atrium: Left atrial size was normal in size. Right Atrium: Right atrial size was normal in size. Pericardium: There is no evidence of pericardial effusion. Mitral Valve: The mitral valve is normal in structure. There is mild thickening of the mitral valve leaflet(s). Mild to moderate mitral valve regurgitation. No evidence of mitral valve stenosis. Tricuspid Valve: The tricuspid valve is normal in structure. Tricuspid valve regurgitation is mild . No evidence of tricuspid stenosis. Aortic Valve: The aortic valve is tricuspid. Aortic valve regurgitation is not visualized. No aortic stenosis is present. Pulmonic Valve: The pulmonic valve was normal in structure. Pulmonic valve regurgitation is not visualized. No evidence of pulmonic stenosis. Aorta: The aortic root is normal in size and structure. Venous: The inferior vena cava is normal in size with greater than 50% respiratory variability, suggesting right atrial pressure of 3 mmHg. IAS/Shunts: No atrial level shunt detected by color flow Doppler.  LEFT VENTRICLE PLAX 2D LVIDd:         4.80 cm Diastology LVIDs:         2.90 cm LV e' medial:    9.14 cm/s LV PW:         0.80 cm LV E/e' medial:  14.0 LV IVS:        0.80 cm LV e'  lateral:   18.70 cm/s                        LV E/e' lateral: 6.8                         2D Longitudinal Strain                        2D Strain GLS Avg:     -17.1 %                         3D Volume EF:                        3D EF:        52 %                        LV EDV:       110 ml                        LV ESV:       53 ml                        LV SV:        58 ml RIGHT VENTRICLE RV Basal diam:  3.30 cm RV Mid diam:    2.60 cm RV S prime:     10.60 cm/s TAPSE (M-mode): 0.8 cm LEFT ATRIUM             Index        RIGHT ATRIUM           Index LA diam:        3.40 cm 2.33 cm/m   RA Area:     10.10 cm LA Vol (A2C):   27.6 ml 18.91 ml/m  RA Volume:   19.60 ml  13.43 ml/m LA Vol (A4C):  22.6 ml 15.49 ml/m LA Biplane Vol: 24.6 ml 16.86 ml/m   AORTA Ao Asc diam: 2.40 cm MITRAL VALVE MV Area (PHT): 3.60 cm MV Decel Time: 211 msec MV E velocity: 128.00 cm/s MV A velocity: 77.10 cm/s MV E/A ratio:  1.66 Julien Nordmann MD Electronically signed by Julien Nordmann MD Signature Date/Time: 01/23/2023/7:42:48 AM    Final

## 2023-02-20 NOTE — Assessment & Plan Note (Signed)
On prograf now.  She takes bactrim 3 times per week for prophylaxis of pcp.

## 2023-02-21 NOTE — Progress Notes (Deleted)
Established patient visit   Patient: Catherine [name]   DOB: 11/17/2000   22 y.o. Female  MRN: [id number] Visit Date: 02/27/2023  Today's healthcare provider: Ronnald Ramp, MD   No chief complaint on file.  Subjective       Discussed the use of AI scribe software for clinical note transcription with the patient, who gave verbal consent to proceed.  History of Present Illness             Medications: Outpatient Medications Prior to Visit  Medication Sig   acetaminophen (TYLENOL) 325 MG tablet Take 2 tablets (650 mg total) by mouth every 6 (six) hours as needed for mild pain or headache.   atorvastatin (LIPITOR) 40 MG tablet Take 40 mg by mouth daily.   BD INSULIN SYRINGE U/F 31G X 5/16" 0.3 ML MISC SMARTSIG:Injection 4 Times Daily   blood glucose meter kit and supplies Dispense based on patient and insurance preference. Use up to four times daily as directed. (FOR ICD-10 E10.9, E11.9).   calcitRIOL (ROCALTROL) 0.25 MCG capsule Take 1 capsule (0.25 mcg total) by mouth as directed. Take 1 capsule (0.25 mcg) three times a week.   clotrimazole (CLOTRIMAZOLE ANTI-FUNGAL) 1 % cream Apply 1 Application topically 2 (two) times daily.   cyanocobalamin (VITAMIN B12) 1000 MCG tablet Take 1 tablet (1,000 mcg total) by mouth daily.   famotidine (PEPCID) 20 MG tablet Take 20 mg by mouth daily as needed for indigestion or heartburn.   glucagon 1 MG injection Inject 1 mg into the muscle as needed. (Patient not taking: Reported on 02/20/2023)   hydrocortisone 2.5 % ointment Apply topically 2 (two) times daily.   hydrOXYzine (VISTARIL) 25 MG capsule Take 1 capsule (25 mg total) by mouth 3 (three) times daily as needed for anxiety.   insulin aspart (NOVOLOG) 100 UNIT/ML injection Inject 5-8 Units into the skin 3 (three) times daily before meals.   Iron-Vitamin C 65-125 MG TABS Take 1 tablet by mouth daily.   LANTUS 100 UNIT/ML injection Inject 0.15 mLs (15 Units total) into  the skin at bedtime.   Multiple Vitamin (MULTIVITAMIN WITH MINERALS) TABS tablet Take 1 tablet by mouth daily.   ondansetron (ZOFRAN-ODT) 4 MG disintegrating tablet Take 4 mg by mouth every 8 (eight) hours as needed for nausea.   oxyCODONE (OXY IR/ROXICODONE) 5 MG immediate release tablet Take 5 mg by mouth every 6 (six) hours as needed for breakthrough pain.   predniSONE (DELTASONE) 10 MG tablet 10 mg as needed.   sildenafil (REVATIO) 20 MG tablet Take 1 tablet (20 mg total) by mouth daily.   sulfamethoxazole-trimethoprim (BACTRIM DS) 800-160 MG tablet Take 1 tablet by mouth 3 (three) times a week.   tacrolimus (PROGRAF) 1 MG capsule Take 2 capsules (2 mg total) by mouth 2 (two) times daily.   torsemide (DEMADEX) 20 MG tablet Take 1 tablet (20 mg total) by mouth daily. (Patient taking differently: Take 20 mg by mouth as needed.)   traZODone (DESYREL) 50 MG tablet Take 0.5-1 tablets (25-50 mg total) by mouth at bedtime as needed for sleep.   Vitamin D, Ergocalciferol, (DRISDOL) 1.25 MG (50000 UNIT) CAPS capsule Take 50,000 Units by mouth every 7 (seven) days.   warfarin (COUMADIN) 2 MG tablet Take 4 tablets (8 mg total) by mouth daily.   No facility-administered medications prior to visit.    Review of Systems  {Insert previous labs (optional):23779} {See past labs  Heme  Chem  Endocrine  Serology  Results Review (optional):1}   Objective    There were no vitals taken for this visit. {Insert last BP/Wt (optional):23777}{See vitals history (optional):1}   Physical Exam  ***  No results found for any visits on 02/27/23.  Assessment & Plan     Problem List Items Addressed This Visit   None   Assessment and Plan              No follow-ups on file.         Ronnald Ramp, MD  [facility] [phone] (phone) [phone] (fax)  [facility]

## 2023-02-22 ENCOUNTER — Ambulatory Visit: Payer: Medicaid Other | Admitting: Cardiology

## 2023-02-27 ENCOUNTER — Ambulatory Visit: Payer: Medicaid Other | Admitting: Family Medicine

## 2023-02-27 DIAGNOSIS — I10 Essential (primary) hypertension: Secondary | ICD-10-CM

## 2023-02-27 DIAGNOSIS — F419 Anxiety disorder, unspecified: Secondary | ICD-10-CM

## 2023-02-27 DIAGNOSIS — E1065 Type 1 diabetes mellitus with hyperglycemia: Secondary | ICD-10-CM

## 2023-02-27 DIAGNOSIS — R21 Rash and other nonspecific skin eruption: Secondary | ICD-10-CM

## 2023-03-12 ENCOUNTER — Ambulatory Visit: Payer: Medicaid Other | Admitting: Cardiology

## 2023-03-21 ENCOUNTER — Inpatient Hospital Stay (HOSPITAL_BASED_OUTPATIENT_CLINIC_OR_DEPARTMENT_OTHER): Payer: Medicaid Other | Admitting: Oncology

## 2023-03-21 ENCOUNTER — Encounter: Payer: Self-pay | Admitting: Oncology

## 2023-03-21 ENCOUNTER — Inpatient Hospital Stay: Payer: Medicaid Other | Attending: Oncology

## 2023-03-21 VITALS — BP 96/76 | HR 86 | Temp 98.4°F | Resp 18 | Wt 116.9 lb

## 2023-03-21 DIAGNOSIS — F17290 Nicotine dependence, other tobacco product, uncomplicated: Secondary | ICD-10-CM | POA: Diagnosis not present

## 2023-03-21 DIAGNOSIS — I2699 Other pulmonary embolism without acute cor pulmonale: Secondary | ICD-10-CM | POA: Diagnosis not present

## 2023-03-21 DIAGNOSIS — N049 Nephrotic syndrome with unspecified morphologic changes: Secondary | ICD-10-CM

## 2023-03-21 DIAGNOSIS — I2609 Other pulmonary embolism with acute cor pulmonale: Secondary | ICD-10-CM

## 2023-03-21 DIAGNOSIS — E538 Deficiency of other specified B group vitamins: Secondary | ICD-10-CM | POA: Diagnosis not present

## 2023-03-21 DIAGNOSIS — Z79899 Other long term (current) drug therapy: Secondary | ICD-10-CM | POA: Diagnosis not present

## 2023-03-21 DIAGNOSIS — D508 Other iron deficiency anemias: Secondary | ICD-10-CM

## 2023-03-21 DIAGNOSIS — D509 Iron deficiency anemia, unspecified: Secondary | ICD-10-CM | POA: Insufficient documentation

## 2023-03-21 LAB — CBC WITH DIFFERENTIAL (CANCER CENTER ONLY)
Abs Immature Granulocytes: 0.02 10*3/uL (ref 0.00–0.07)
Basophils Absolute: 0.1 10*3/uL (ref 0.0–0.1)
Basophils Relative: 1 %
Eosinophils Absolute: 0.1 10*3/uL (ref 0.0–0.5)
Eosinophils Relative: 2 %
HCT: 37.6 % (ref 36.0–46.0)
Hemoglobin: 12.1 g/dL (ref 12.0–15.0)
Immature Granulocytes: 0 %
Lymphocytes Relative: 25 %
Lymphs Abs: 1.8 10*3/uL (ref 0.7–4.0)
MCH: 25.7 pg — ABNORMAL LOW (ref 26.0–34.0)
MCHC: 32.2 g/dL (ref 30.0–36.0)
MCV: 79.8 fL — ABNORMAL LOW (ref 80.0–100.0)
Monocytes Absolute: 0.5 10*3/uL (ref 0.1–1.0)
Monocytes Relative: 7 %
Neutro Abs: 4.5 10*3/uL (ref 1.7–7.7)
Neutrophils Relative %: 65 %
Platelet Count: 315 10*3/uL (ref 150–400)
RBC: 4.71 MIL/uL (ref 3.87–5.11)
RDW: 16.4 % — ABNORMAL HIGH (ref 11.5–15.5)
WBC Count: 7 10*3/uL (ref 4.0–10.5)
nRBC: 0 % (ref 0.0–0.2)

## 2023-03-21 LAB — CMP (CANCER CENTER ONLY)
ALT: 14 U/L (ref 0–44)
AST: 18 U/L (ref 15–41)
Albumin: 3.6 g/dL (ref 3.5–5.0)
Alkaline Phosphatase: 103 U/L (ref 38–126)
Anion gap: 4 — ABNORMAL LOW (ref 5–15)
BUN: 30 mg/dL — ABNORMAL HIGH (ref 6–20)
CO2: 22 mmol/L (ref 22–32)
Calcium: 8.9 mg/dL (ref 8.9–10.3)
Chloride: 109 mmol/L (ref 98–111)
Creatinine: 0.9 mg/dL (ref 0.44–1.00)
GFR, Estimated: >60 mL/min (ref >60)
Glucose, Bld: 241 mg/dL — ABNORMAL HIGH (ref 70–99)
Potassium: 4.1 mmol/L (ref 3.5–5.1)
Sodium: 135 mmol/L (ref 135–145)
Total Bilirubin: 0.5 mg/dL (ref 0.3–1.2)
Total Protein: 7 g/dL (ref 6.5–8.1)

## 2023-03-21 LAB — PROTIME-INR
INR: 1.3 — ABNORMAL HIGH (ref 0.8–1.2)
Prothrombin Time: 15.9 s — ABNORMAL HIGH (ref 11.4–15.2)

## 2023-03-21 NOTE — Assessment & Plan Note (Addendum)
On prograf now.  She takes bactrim 3 times per week for prophylaxis of pcp.

## 2023-03-21 NOTE — Assessment & Plan Note (Signed)
Recommend patient to start B12 supplementation.

## 2023-03-21 NOTE — Assessment & Plan Note (Addendum)
Lab Results  Component Value Date   HGB 12.1 03/21/2023   TIBC 435 02/20/2023   IRONPCTSAT 12 02/20/2023   FERRITIN 28 02/20/2023    Both hemoglobin and ferritin have improved.  MCV is trending down. I recommend patient to resume Vitron C 1 tab daily.

## 2023-03-21 NOTE — Assessment & Plan Note (Addendum)
Pulmonary embolism due to hypercoagulability secondary to nephrotic syndrome/hypoalbuminemia She received mechanical embolectomy during last admission and had a recurrent pulmonary embolism despite taking Eliquis.  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 Recommend patient to continue coumadin with goal of INR 2-3 Coumadin dosage has been adjusted to 8mg  due to recent start of bactrim PCP prefers hematology to continue manage her coumadin dosing.  Today's INR is 1., I recommend patient to increase coumadin to 10mg  daily coumadin daily. Repeat INR in 1 week.

## 2023-03-21 NOTE — Progress Notes (Signed)
Hematology/Oncology Progress note [phone] [phone]           REFERRING PROVIDER: Brett Albino*   CHIEF COMPLAINTS/REASON FOR VISIT:  Follow up for bilateral pulmonary embolism, due to nephrotic syndrome.    ASSESSMENT & PLAN:   Pulmonary embolism (HCC) Pulmonary embolism due to hypercoagulability secondary to nephrotic syndrome/hypoalbuminemia She received mechanical embolectomy during last admission and had a recurrent pulmonary embolism despite taking Eliquis.  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 Recommend patient to continue coumadin with goal of INR 2-3 Coumadin dosage has been adjusted to 8mg  due to recent start of bactrim  Today's INR is 1., I recommend patient to increase coumadin to 10mg  daily coumadin daily. Repeat INR in 1 week.   Nephrotic syndrome On prograf now.  She takes bactrim 3 times per week for prophylaxis of pcp.   Vitamin B12 deficiency Recommend patient to start B12 supplementation.   IDA (iron deficiency anemia) Lab Results  Component Value Date   HGB 12.1 03/21/2023   TIBC 435 02/20/2023   IRONPCTSAT 12 02/20/2023   FERRITIN 28 02/20/2023    Both hemoglobin and ferritin have improved.  MCV is trending down. I recommend patient to resume Vitron C 1 tab daily.   Orders Placed This Encounter  Procedures   CBC with Differential (Cancer Center Only)    Standing Status:   Future    Standing Expiration Date:   03/20/2024   Ferritin    Standing Status:   Future    Standing Expiration Date:   03/20/2024   Iron and TIBC    Standing Status:   Future    Standing Expiration Date:   03/20/2024   Protime-INR    Standing Status:   Future    Standing Expiration Date:   03/20/2024   Follow up 4 weeks.  All questions were answered. The patient knows to call the clinic with any problems, questions or concerns.  Rickard Patience, MD, PhD [facility]  Health Hematology Oncology 03/21/2023   HISTORY OF PRESENTING ILLNESS:   Catherine [name] is a  22 y.o.  female presents for follow up of bilateral pulmonary embolism 06/30/2022 - 07/05/2022 patient was admitted at [facility] due to AKI on CKD, proteinuria, hypoalbuminemia Ultrasound of bilateral lower extremity was negative for DVT. Patient was started on Eliquis 5 mg twice daily for prophylactic anticoagulation due to the concern of hypercoagulability secondary to hypoalbuminemia/nephrotic syndrome.  Patient took 30 days of Eliquis and ran out after that.   08/22/2022 - 08/25/2022, patient was hospitalized at [facility] due to shortness of breath, 08/22/2021, CT chest PE: Showed acute submassive PE with right heart strain.  Multifocal bilateral upper lobe nodules-favor infectious process.  Multifocal groundglass opacities with subpleural sparing likely reflects combination of pulmonary hemorrhage and/for any infectious or inflammatory etiology. Patient was treated with heparin drip, transition to Eliquis. She underwent thrombolysis and mechanical embolectomy by Dr. Wyn Quaker on 08/22/2022.   08/30/2022, patient returned to the emergency room due to shortness of breath. Repeat CT chest angiogram showed interval development of confluent patchy and posterior consolidation airspace disease in the right middle lobe suggesting infarct. Bulky bilateral pulmonary embolism again noted, occlusive in right middle lobe lobar branch and segmental branches to the right lower lobe, similar to prior. Medial segmental and subsegmental branches to the right lower lobe are patent on the current study despite being occluded previously. Small volume occlusive thrombus in a subsegmental right  upper lobe branches similar to prior.  No change in the nonocclusive thrombus in the segmental branches to the left lower lobe.  Enlargement of pulmonary outflow tract/main pulmonary arteries.  Cavitary lesions in the lateral and posterior right upper  lobe.  Nodular and patchy areas of consolidative airspace disease Patient was restarted on heparin drip and was transitioned to Lovenox 1mg /kg BID bridging to coumadin with INR goal 2-3  Cavitary lesion, Patient was seen by infectious disease physician for pulmonary infiltrates.  Pulmonologist does not recommend bronchoscopy due to high risk.   10/18/2022 - 11/05/2022.  Patient was hospitalized, initially at [facility] for septic shock presumably from pneumonia, she required Levophed vasopressor, covered with vancomycin and cefepime, azithromycin.  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 She tolerated procedure.  Patient was on heparin drip which bridged to Eliquis at discharge. She is currently taking Sildenafil   10/26/22 right superior segment wedge resection showed - Mild to moderate pulmonary arterial hypertension with focal intraluminal organized thrombi and recanalization; features consistent with thromboembolic pulmonary hypertension.  - Patchy interstitial fibrosis with associated intraalveolar macrophages. See comment. - Granulomatous inflammation not identified  She followed up with pulmonology and was seen by Dr.Dgayli on 11/17/2022. There was concern that the pathogen was not ideal choice for her. Dr.Dgayli communicated with with [facility] team and Dr.Rajagopal agrees that pulmonary is likely a better choice for her situation.  Patient today presents for transition to pulmonary.  She reports feeling well and be compliant on all her medications.  She is alone by herself today.  Her parents have been very involved in her care in the past.  I offered to call her parents during the visit and  patient declined. She denies shortness of breath, chest pain.  She has also now establish care with primary care provider.  INTERVAL HISTORY Catherine [name] is a 22 y.o. female who has above history reviewed by me today presents for follow up visit for recurrent bilateral pulmonary embolism.  She is on coumadin 8mg  daily, reports compliance.  On Bactrim for pcp prophylaxis.  No new complaints. No bleeding events.    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   Nephrotic syndrome 08/23/2019   Pulmonary embolism (HCC) 08/30/2022   Sepsis associated hypotension (HCC) 10/14/2022   Severe sepsis with septic shock (CODE) (HCC) 09/26/2021   Tobacco dependence     SURGICAL HISTORY: Past Surgical History:  Procedure Laterality Date   PULMONARY THROMBECTOMY Bilateral 08/22/2022   Procedure: PULMONARY THROMBECTOMY;  Surgeon: Annice Needy, MD;  Location: [facility] INVASIVE CV LAB;  Service: Cardiovascular;  Laterality: Bilateral;    SOCIAL HISTORY: 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  Tobacco Use   Smoking status: Former    Types: E-cigarettes    Quit date: 07/2022  Years since quitting: 0.7   Smokeless tobacco: Never   Tobacco comments:    Quit substance use since the beginning of the year   Vaping Use   Vaping status: Former  Substance and Sexual Activity   Alcohol use: Not Currently   Drug use: Yes    Types: Marijuana    Comment: Quit using marijuana approx earlier this year   Sexual activity: Not Currently  Other Topics Concern   Not on file  Social History  Narrative   Not on file   Social Determinants of Health   Financial Resource Strain: Patient Declined (10/18/2022)   Received from [facility], [facility]   Overall Financial Resource Strain (CARDIA)    Difficulty of Paying Living Expenses: Patient declined  Food Insecurity: Patient Declined (10/18/2022)   Received from [facility], [facility]   Hunger Vital Sign    Worried About Running Out of Food in the Last Year: Patient declined    Ran Out of Food in the Last Year: Patient declined  Transportation Needs: No Transportation Needs (10/19/2022)   Received from [facility], [facility]   PRAPARE - Transportation    In the past 12 months, has lack of transportation kept you from medical appointments or from getting medications?: No    Lack of Transportation (Non-Medical): No  Physical Activity: Not on file  Stress: Not on file  Social Connections: Not on file  Intimate Partner Violence: Not At Risk (10/14/2022)   Humiliation, Afraid, Rape, and Kick questionnaire    Fear of Current or Ex-Partner: No    Emotionally Abused: No    Physically Abused: No    Sexually Abused: No    FAMILY HISTORY: History reviewed. No pertinent family history.  ALLERGIES:  has No Known Allergies.  MEDICATIONS:  Current Outpatient Medications  Medication Sig Dispense Refill   acetaminophen (TYLENOL) 325 MG tablet Take 2 tablets (650 mg total) by mouth every 6 (six) hours as needed for mild pain or headache. 20 tablet 0   atorvastatin (LIPITOR) 40 MG tablet Take 40 mg by mouth daily.     BD INSULIN SYRINGE U/F 31G X 5/16" 0.3 ML MISC SMARTSIG:Injection 4 Times Daily 100 each 6   blood glucose meter kit and supplies Dispense based on patient and insurance preference. Use up to four times daily as directed. (FOR ICD-10 E10.9, E11.9). 1 each 0   calcitRIOL (ROCALTROL) 0.25 MCG capsule Take 1 capsule  (0.25 mcg total) by mouth as directed. Take 1 capsule (0.25 mcg) three times a week. 60 capsule 2   clotrimazole (CLOTRIMAZOLE ANTI-FUNGAL) 1 % cream Apply 1 Application topically 2 (two) times daily. 30 g 0   cyanocobalamin (VITAMIN B12) 1000 MCG tablet Take 1 tablet (1,000 mcg total) by mouth daily. 30 tablet 2   famotidine (PEPCID) 20 MG tablet Take 20 mg by mouth daily as needed for indigestion or heartburn.     hydrocortisone 2.5 % ointment Apply topically 2 (two) times daily. 30 g 0   hydrOXYzine (VISTARIL) 25 MG capsule Take 1 capsule (25 mg total) by mouth 3 (three) times daily as needed for anxiety. 60 capsule 2   insulin aspart (NOVOLOG) 100 UNIT/ML injection Inject 5-8 Units into the skin 3 (three) times daily before meals. 7.2 mL 2   Iron-Vitamin C 65-125 MG TABS Take 1 tablet by mouth daily. 30 tablet 2   LANTUS 100 UNIT/ML injection Inject 0.15 mLs (  15 Units total) into the skin at bedtime. 4.5 mL 2   Multiple Vitamin (MULTIVITAMIN WITH MINERALS) TABS tablet Take 1 tablet by mouth daily.     ondansetron (ZOFRAN-ODT) 4 MG disintegrating tablet Take 4 mg by mouth every 8 (eight) hours as needed for nausea.     oxyCODONE (OXY IR/ROXICODONE) 5 MG immediate release tablet Take 5 mg by mouth every 6 (six) hours as needed for breakthrough pain.     predniSONE (DELTASONE) 10 MG tablet 10 mg as needed.     sildenafil (REVATIO) 20 MG tablet Take 1 tablet (20 mg total) by mouth daily. 90 tablet 1   sulfamethoxazole-trimethoprim (BACTRIM DS) 800-160 MG tablet Take 1 tablet by mouth 3 (three) times a week. 45 tablet 0   torsemide (DEMADEX) 20 MG tablet Take 1 tablet (20 mg total) by mouth daily. (Patient taking differently: Take 20 mg by mouth as needed.) 30 tablet 0   traZODone (DESYREL) 50 MG tablet Take 0.5-1 tablets (25-50 mg total) by mouth at bedtime as needed for sleep. 30 tablet 0   Vitamin D, Ergocalciferol, (DRISDOL) 1.25 MG (50000 UNIT) CAPS capsule Take 50,000 Units by mouth every 7  (seven) days.     warfarin (COUMADIN) 2 MG tablet Take 4 tablets (8 mg total) by mouth daily. 120 tablet 1   glucagon 1 MG injection Inject 1 mg into the muscle as needed. (Patient not taking: Reported on 02/20/2023)     No current facility-administered medications for this visit.    Review of Systems  Constitutional:  Negative for appetite change, chills, fatigue and fever.  HENT:   Negative for hearing loss and voice change.   Eyes:  Negative for eye problems.  Respiratory:  Negative for chest tightness and cough.   Cardiovascular:  Negative for chest pain.  Gastrointestinal:  Negative for abdominal distention, abdominal pain and blood in stool.  Endocrine: Negative for hot flashes.  Genitourinary:  Negative for difficulty urinating and frequency.   Musculoskeletal:  Negative for arthralgias.  Skin:  Negative for itching and rash.  Neurological:  Negative for extremity weakness.  Hematological:  Negative for adenopathy.  Psychiatric/Behavioral:  Negative for confusion.    PHYSICAL EXAMINATION:  Vitals:   03/21/23 1016  BP: 96/76  Pulse: 86  Resp: 18  Temp: 98.4 F (36.9 C)  SpO2: 99%   Filed Weights   03/21/23 1016  Weight: 116 lb 14.4 oz (53 kg)    Physical Exam Constitutional:      General: She is not in acute distress. HENT:     Head: Normocephalic and atraumatic.  Eyes:     General: No scleral icterus. Cardiovascular:     Rate and Rhythm: Normal rate.  Pulmonary:     Effort: Pulmonary effort is normal. No respiratory distress.  Abdominal:     General: Bowel sounds are normal. There is no distension.     Palpations: Abdomen is soft.  Musculoskeletal:        General: No deformity. Normal range of motion.     Cervical back: Normal range of motion and neck supple.  Skin:    General: Skin is warm and dry.     Findings: No erythema or rash.  Neurological:     Mental Status: She is alert and oriented to person, place, and time. Mental status is at baseline.   Psychiatric:        Mood and Affect: Mood normal.     LABORATORY DATA:  I have reviewed the data  as listed    Latest Ref Rng & Units 03/21/2023    9:53 AM 02/20/2023   10:48 AM 12/01/2022   10:42 AM  CBC  WBC 4.0 - 10.5 K/uL 7.0  6.6  6.7   Hemoglobin 12.0 - 15.0 g/dL 16.1  09.6  9.4   Hematocrit 36.0 - 46.0 % 37.6  40.6  30.0   Platelets 150 - 400 K/uL 315  373  447       Latest Ref Rng & Units 03/21/2023    9:53 AM 02/20/2023   10:48 AM 10/18/2022    5:47 AM  CMP  Glucose 70 - 99 mg/dL 045  409  811   BUN 6 - 20 mg/dL 30  34  47   Creatinine 0.44 - 1.00 mg/dL 9.14  7.82  9.56   Sodium 135 - 145 mmol/L 135  132  133   Potassium 3.5 - 5.1 mmol/L 4.1  4.2  4.4   Chloride 98 - 111 mmol/L 109  106  108   CO2 22 - 32 mmol/L 22  18  20    Calcium 8.9 - 10.3 mg/dL 8.9  9.1  7.8   Total Protein 6.5 - 8.1 g/dL 7.0  7.9    Total Bilirubin 0.3 - 1.2 mg/dL 0.5  0.4    Alkaline Phos 38 - 126 U/L 103  113    AST 15 - 41 U/L 18  29    ALT 0 - 44 U/L 14  26        RADIOGRAPHIC STUDIES: I have personally reviewed the radiological images as listed and agreed with the findings in the report. ECHOCARDIOGRAM COMPLETE  Result Date: 01/23/2023    ECHOCARDIOGRAM REPORT   Patient Name:   KESHANNA JANICKE Date of Exam: 01/22/2023 Medical Rec #:  [id number]           Height:       59.0 in Accession #:    [id number]          Weight:       115.3 lb Date of Birth:  [birth year]           BSA:          1.459 m Patient Age:    21 years            BP:           118/88 mmHg Patient Gender: F                   HR:           77 bpm. Exam Location:  [place] Procedure: 2D Echo, 3D Echo, Cardiac Doppler, Color Doppler and Strain Analysis Indications:    CTEPH (chronic thromboembolic pulmonary hypertension) (HCC)                 [132440]                 ; I27.20 Pulmonary Hypertension  History:        Patient has prior history of Echocardiogram examinations, most                 recent 10/15/2022. Prior Cardiac  Surgery, Pulmonary HTN; Risk                 Factors:Diabetes, Hypertension and Dyslipidemia.  Sonographer:    Ilda Mori RDCS, MHA Referring Phys: [id number] BRIAN AGBOR-ETANG IMPRESSIONS  1. Left ventricular ejection fraction, by estimation, is 60 to 65%. The  left ventricle has normal function. The left ventricle has no regional wall motion abnormalities. Left ventricular diastolic parameters were normal. The average left ventricular global longitudinal strain is -17.1 %.  2. Right ventricular systolic function is normal. The right ventricular size is normal. Tricuspid regurgitation signal is inadequate for assessing PA pressure.  3. The mitral valve is normal in structure. Mild to moderate mitral valve regurgitation. No evidence of mitral stenosis.  4. The aortic valve is tricuspid. Aortic valve regurgitation is not visualized. No aortic stenosis is present.  5. The inferior vena cava is normal in size with greater than 50% respiratory variability, suggesting right atrial pressure of 3 mmHg. FINDINGS  Left Ventricle: Left ventricular ejection fraction, by estimation, is 60 to 65%. The left ventricle has normal function. The left ventricle has no regional wall motion abnormalities. The average left ventricular global longitudinal strain is -17.1 %. The left ventricular internal cavity size was normal in size. There is no left ventricular hypertrophy. Left ventricular diastolic parameters were normal. Right Ventricle: The right ventricular size is normal. No increase in right ventricular wall thickness. Right ventricular systolic function is normal. Tricuspid regurgitation signal is inadequate for assessing PA pressure. Left Atrium: Left atrial size was normal in size. Right Atrium: Right atrial size was normal in size. Pericardium: There is no evidence of pericardial effusion. Mitral Valve: The mitral valve is normal in structure. There is mild thickening of the mitral valve leaflet(s). Mild to moderate mitral  valve regurgitation. No evidence of mitral valve stenosis. Tricuspid Valve: The tricuspid valve is normal in structure. Tricuspid valve regurgitation is mild . No evidence of tricuspid stenosis. Aortic Valve: The aortic valve is tricuspid. Aortic valve regurgitation is not visualized. No aortic stenosis is present. Pulmonic Valve: The pulmonic valve was normal in structure. Pulmonic valve regurgitation is not visualized. No evidence of pulmonic stenosis. Aorta: The aortic root is normal in size and structure. Venous: The inferior vena cava is normal in size with greater than 50% respiratory variability, suggesting right atrial pressure of 3 mmHg. IAS/Shunts: No atrial level shunt detected by color flow Doppler.  LEFT VENTRICLE PLAX 2D LVIDd:         4.80 cm Diastology LVIDs:         2.90 cm LV e' medial:    9.14 cm/s LV PW:         0.80 cm LV E/e' medial:  14.0 LV IVS:        0.80 cm LV e' lateral:   18.70 cm/s                        LV E/e' lateral: 6.8                         2D Longitudinal Strain                        2D Strain GLS Avg:     -17.1 %                         3D Volume EF:                        3D EF:        52 %  LV EDV:       110 ml                        LV ESV:       53 ml                        LV SV:        58 ml RIGHT VENTRICLE RV Basal diam:  3.30 cm RV Mid diam:    2.60 cm RV S prime:     10.60 cm/s TAPSE (M-mode): 0.8 cm LEFT ATRIUM             Index        RIGHT ATRIUM           Index LA diam:        3.40 cm 2.33 cm/m   RA Area:     10.10 cm LA Vol (A2C):   27.6 ml 18.91 ml/m  RA Volume:   19.60 ml  13.43 ml/m LA Vol (A4C):   22.6 ml 15.49 ml/m LA Biplane Vol: 24.6 ml 16.86 ml/m   AORTA Ao Asc diam: 2.40 cm MITRAL VALVE MV Area (PHT): 3.60 cm MV Decel Time: 211 msec MV E velocity: 128.00 cm/s MV A velocity: 77.10 cm/s MV E/A ratio:  1.66 Julien Nordmann MD Electronically signed by Julien Nordmann MD Signature Date/Time: 01/23/2023/7:42:48 AM    Final

## 2023-03-22 ENCOUNTER — Telehealth: Payer: Self-pay

## 2023-03-22 ENCOUNTER — Other Ambulatory Visit: Payer: Self-pay

## 2023-03-22 DIAGNOSIS — I2699 Other pulmonary embolism without acute cor pulmonale: Secondary | ICD-10-CM

## 2023-03-22 NOTE — Telephone Encounter (Signed)
-----   Message from Rickard Patience sent at 03/21/2023  9:42 PM EDT ----- Please advise patient to increase Coumadin dose to 10 mg daily.  Repeat INR in 1 week.

## 2023-03-22 NOTE — Telephone Encounter (Signed)
Please schedule lab on 9/25 @ 10 am.

## 2023-03-23 ENCOUNTER — Other Ambulatory Visit: Payer: Self-pay | Admitting: Oncology

## 2023-03-23 MED ORDER — WARFARIN SODIUM 5 MG PO TABS: 10.0000 mg | ORAL_TABLET | Freq: Every day | ORAL | 1 refills | Status: DC

## 2023-03-28 ENCOUNTER — Inpatient Hospital Stay: Payer: Medicaid Other

## 2023-03-28 DIAGNOSIS — I2699 Other pulmonary embolism without acute cor pulmonale: Secondary | ICD-10-CM

## 2023-03-28 LAB — PROTIME-INR
INR: 1.7 — ABNORMAL HIGH (ref 0.8–1.2)
Prothrombin Time: 20.3 seconds — ABNORMAL HIGH (ref 11.4–15.2)

## 2023-03-29 ENCOUNTER — Other Ambulatory Visit: Payer: Self-pay | Admitting: Oncology

## 2023-03-29 MED ORDER — WARFARIN SODIUM 1 MG PO TABS: 1.0000 mg | ORAL_TABLET | Freq: Every day | ORAL | 0 refills | Status: DC

## 2023-04-04 ENCOUNTER — Inpatient Hospital Stay: Payer: Medicaid Other | Attending: Oncology

## 2023-04-04 DIAGNOSIS — D509 Iron deficiency anemia, unspecified: Secondary | ICD-10-CM | POA: Insufficient documentation

## 2023-04-04 DIAGNOSIS — D508 Other iron deficiency anemias: Secondary | ICD-10-CM

## 2023-04-04 DIAGNOSIS — I2699 Other pulmonary embolism without acute cor pulmonale: Secondary | ICD-10-CM | POA: Insufficient documentation

## 2023-04-04 DIAGNOSIS — Z79899 Other long term (current) drug therapy: Secondary | ICD-10-CM | POA: Diagnosis not present

## 2023-04-04 LAB — CBC WITH DIFFERENTIAL (CANCER CENTER ONLY)
Abs Immature Granulocytes: 0.02 10*3/uL (ref 0.00–0.07)
Basophils Absolute: 0 10*3/uL (ref 0.0–0.1)
Basophils Relative: 0 %
Eosinophils Absolute: 0.2 10*3/uL (ref 0.0–0.5)
Eosinophils Relative: 3 %
HCT: 39.5 % (ref 36.0–46.0)
Hemoglobin: 13 g/dL (ref 12.0–15.0)
Immature Granulocytes: 0 %
Lymphocytes Relative: 31 %
Lymphs Abs: 2.1 10*3/uL (ref 0.7–4.0)
MCH: 25.9 pg — ABNORMAL LOW (ref 26.0–34.0)
MCHC: 32.9 g/dL (ref 30.0–36.0)
MCV: 78.7 fL — ABNORMAL LOW (ref 80.0–100.0)
Monocytes Absolute: 0.5 10*3/uL (ref 0.1–1.0)
Monocytes Relative: 7 %
Neutro Abs: 4.1 10*3/uL (ref 1.7–7.7)
Neutrophils Relative %: 59 %
Platelet Count: 336 10*3/uL (ref 150–400)
RBC: 5.02 MIL/uL (ref 3.87–5.11)
RDW: 15.7 % — ABNORMAL HIGH (ref 11.5–15.5)
WBC Count: 6.9 10*3/uL (ref 4.0–10.5)
nRBC: 0 % (ref 0.0–0.2)

## 2023-04-04 LAB — PROTIME-INR
INR: 1.7 — ABNORMAL HIGH (ref 0.8–1.2)
Prothrombin Time: 20.3 s — ABNORMAL HIGH (ref 11.4–15.2)

## 2023-04-04 LAB — IRON AND TIBC
Iron: 33 ug/dL (ref 28–170)
Saturation Ratios: 9 % — ABNORMAL LOW (ref 10.4–31.8)
TIBC: 363 ug/dL (ref 250–450)
UIBC: 330 ug/dL

## 2023-04-04 LAB — FERRITIN: Ferritin: 20 ng/mL (ref 11–307)

## 2023-04-05 ENCOUNTER — Telehealth: Payer: Self-pay

## 2023-04-05 NOTE — Telephone Encounter (Signed)
Error

## 2023-04-05 NOTE — Telephone Encounter (Signed)
Pt confirmed that she is taking 11 mg daily.

## 2023-04-05 NOTE — Telephone Encounter (Signed)
Morrie Sheldon please schedule lab on 10/9 @ 2p

## 2023-04-05 NOTE — Telephone Encounter (Signed)
-----   Message from Rickard Patience sent at 04/04/2023  9:12 PM EDT ----- Please clarify what dosage of coumadin is she taking, 10mg  daily or 11mg  daily?  Thanks.

## 2023-04-05 NOTE — Telephone Encounter (Signed)
Per Dr. Cathie Hoops increase coumadin to 12mg  daily and recheck next week. Pt aware.

## 2023-04-06 ENCOUNTER — Other Ambulatory Visit: Payer: Self-pay

## 2023-04-06 DIAGNOSIS — I2699 Other pulmonary embolism without acute cor pulmonale: Secondary | ICD-10-CM

## 2023-04-06 NOTE — Telephone Encounter (Signed)
please schedule lab (PT/INR) on 04/11/23 @ 2p. Pt aware of appt.

## 2023-04-11 ENCOUNTER — Inpatient Hospital Stay: Payer: Medicaid Other

## 2023-04-11 ENCOUNTER — Other Ambulatory Visit: Payer: Self-pay | Admitting: Family Medicine

## 2023-04-11 DIAGNOSIS — E1065 Type 1 diabetes mellitus with hyperglycemia: Secondary | ICD-10-CM

## 2023-04-11 DIAGNOSIS — I2699 Other pulmonary embolism without acute cor pulmonale: Secondary | ICD-10-CM

## 2023-04-11 LAB — PROTIME-INR
INR: 1.6 — ABNORMAL HIGH (ref 0.8–1.2)
Prothrombin Time: 19.3 s — ABNORMAL HIGH (ref 11.4–15.2)

## 2023-04-11 MED ORDER — "BD INSULIN SYRINGE U/F 31G X 5/16"" 0.3 ML MISC": 6 refills | Status: DC

## 2023-04-11 NOTE — Telephone Encounter (Signed)
Medication Refill - Medication: BD INSULIN SYRINGE U/F 31G X 5/16" 0.3 ML MISC [[id number]]   Has the patient contacted their pharmacy? Yes.     (Agent: If yes, when and what did the pharmacy advise?) Contact PCP   Preferred Pharmacy (with phone number or street name): [id number] - [place], [place] - [address] AT [address]   Has the patient been seen for an appointment in the last year OR does the patient have an upcoming appointment? Yes.    Agent: Please be advised that RX refills may take up to 3 business days. We ask that you follow-up with your pharmacy.

## 2023-04-11 NOTE — Telephone Encounter (Signed)
Requested Prescriptions  Pending Prescriptions Disp Refills   BD INSULIN SYRINGE U/F 31G X 5/16" 0.3 ML MISC 100 each 6    Sig: SMARTSIG:Injection 4 Times Daily     There is no refill protocol information for this order

## 2023-04-13 ENCOUNTER — Telehealth: Payer: Self-pay | Admitting: Family Medicine

## 2023-04-13 NOTE — Telephone Encounter (Signed)
insulin aspart (NOVOLOG) 100 UNIT/ML injection, unable to reorder. Routing for approval.

## 2023-04-13 NOTE — Telephone Encounter (Signed)
Medication Refill - Medication: insulin aspart (NOVOLOG) 100 UNIT/ML injection [[id number]  Has the patient contacted their pharmacy? Yes.   (   Preferred Pharmacy (with phone number or street name):  [id number] [place], [place] - [address] AT [facility] OF [address]  [address] [place] [address]  Phone: [phone] Fax: [phone]  Hours: Not open 24 hours      Has the patient been seen for an appointment in the last year OR does the patient have an upcoming appointment? Yes.    Agent: Please be advised that RX refills may take up to 3 business days. We ask that you follow-up with your pharmacy.

## 2023-04-17 ENCOUNTER — Telehealth: Payer: Self-pay

## 2023-04-17 ENCOUNTER — Ambulatory Visit: Payer: Medicaid Other | Admitting: Oncology

## 2023-04-17 ENCOUNTER — Other Ambulatory Visit: Payer: Medicaid Other

## 2023-04-17 DIAGNOSIS — E1065 Type 1 diabetes mellitus with hyperglycemia: Secondary | ICD-10-CM

## 2023-04-17 MED ORDER — BD INSULIN SYRINGE U/F 31G X 5/16" 0.3 ML MISC
6 refills | Status: DC
Start: 2023-04-17 — End: 2024-02-04

## 2023-04-17 MED ORDER — LANTUS 100 UNIT/ML ~~LOC~~ SOLN: 15.0000 [IU] | Freq: Every day | SUBCUTANEOUS | 2 refills | Status: AC

## 2023-04-17 MED ORDER — INSULIN ASPART 100 UNIT/ML IJ SOLN: 5.0000 [IU] | Freq: Three times a day (TID) | INTRAMUSCULAR | 2 refills | Status: DC

## 2023-04-17 NOTE — Telephone Encounter (Signed)
Reviewed encounters, patient has appt scheduled Nov 5th with endocrinology, last appt appears to have been in May with endocrinology in [place]   Will provide refill of lantus 15 units daily, syringes and novolog 5-8 units three times daily with meals until patient is able to establish with endocrinology    Rx sent to [id number]  Ronnald Ramp, MD  [facility]

## 2023-04-17 NOTE — Telephone Encounter (Signed)
I think this was refused by one of the providers bc they thought she was followed by endocrinology but I don't think she was seen there yet.   Copied from CRM [id number]. Topic: General - Other >> Apr 17, 2023 10:26 AM Franchot Heidelberg wrote: Reason for CRM: Pt called for update on refill request for her insulin, please advise

## 2023-04-17 NOTE — Addendum Note (Signed)
Addended by: Bing Neighbors on: 04/17/2023 04:50 PM   Modules accepted: Orders

## 2023-04-18 ENCOUNTER — Other Ambulatory Visit: Payer: Self-pay

## 2023-04-18 DIAGNOSIS — I2699 Other pulmonary embolism without acute cor pulmonale: Secondary | ICD-10-CM

## 2023-04-18 DIAGNOSIS — D509 Iron deficiency anemia, unspecified: Secondary | ICD-10-CM

## 2023-04-18 NOTE — Telephone Encounter (Signed)
Spoke with Kenney Houseman patient's mother and made aware of providers message.

## 2023-04-19 ENCOUNTER — Other Ambulatory Visit: Payer: Self-pay

## 2023-04-19 ENCOUNTER — Inpatient Hospital Stay: Payer: Medicaid Other

## 2023-04-19 ENCOUNTER — Inpatient Hospital Stay: Payer: Medicaid Other | Admitting: Oncology

## 2023-04-19 DIAGNOSIS — D509 Iron deficiency anemia, unspecified: Secondary | ICD-10-CM

## 2023-04-19 DIAGNOSIS — I2699 Other pulmonary embolism without acute cor pulmonale: Secondary | ICD-10-CM

## 2023-04-19 LAB — IRON AND TIBC
Iron: 29 ug/dL (ref 28–170)
Saturation Ratios: 7 % — ABNORMAL LOW (ref 10.4–31.8)
TIBC: 392 ug/dL (ref 250–450)
UIBC: 363 ug/dL

## 2023-04-19 LAB — CBC WITH DIFFERENTIAL (CANCER CENTER ONLY)
Abs Immature Granulocytes: 0.02 10*3/uL (ref 0.00–0.07)
Basophils Absolute: 0.1 10*3/uL (ref 0.0–0.1)
Basophils Relative: 1 %
Eosinophils Absolute: 0.2 10*3/uL (ref 0.0–0.5)
Eosinophils Relative: 2 %
HCT: 39.2 % (ref 36.0–46.0)
Hemoglobin: 13.1 g/dL (ref 12.0–15.0)
Immature Granulocytes: 0 %
Lymphocytes Relative: 25 %
Lymphs Abs: 1.7 10*3/uL (ref 0.7–4.0)
MCH: 26.6 pg (ref 26.0–34.0)
MCHC: 33.4 g/dL (ref 30.0–36.0)
MCV: 79.7 fL — ABNORMAL LOW (ref 80.0–100.0)
Monocytes Absolute: 0.4 10*3/uL (ref 0.1–1.0)
Monocytes Relative: 6 %
Neutro Abs: 4.4 10*3/uL (ref 1.7–7.7)
Neutrophils Relative %: 66 %
Platelet Count: 365 10*3/uL (ref 150–400)
RBC: 4.92 MIL/uL (ref 3.87–5.11)
RDW: 15.1 % (ref 11.5–15.5)
WBC Count: 6.7 10*3/uL (ref 4.0–10.5)
nRBC: 0 % (ref 0.0–0.2)

## 2023-04-19 LAB — PROTIME-INR
INR: 2.1 — ABNORMAL HIGH (ref 0.8–1.2)
Prothrombin Time: 23.4 s — ABNORMAL HIGH (ref 11.4–15.2)

## 2023-04-19 LAB — FERRITIN: Ferritin: 30 ng/mL (ref 11–307)

## 2023-04-20 ENCOUNTER — Other Ambulatory Visit: Payer: Self-pay | Admitting: Oncology

## 2023-04-20 MED ORDER — WARFARIN SODIUM 1 MG PO TABS: 1.0000 mg | ORAL_TABLET | ORAL | 0 refills | Status: DC

## 2023-04-20 MED ORDER — WARFARIN SODIUM 1 MG PO TABS: 1.0000 mg | ORAL_TABLET | Freq: Every day | ORAL | 0 refills | Status: DC

## 2023-04-20 MED ORDER — WARFARIN SODIUM 5 MG PO TABS: 10.0000 mg | ORAL_TABLET | Freq: Every day | ORAL | 5 refills | Status: DC

## 2023-04-24 ENCOUNTER — Ambulatory Visit: Payer: Medicaid Other | Attending: Cardiology | Admitting: Cardiology

## 2023-04-25 ENCOUNTER — Inpatient Hospital Stay: Payer: Medicaid Other | Admitting: Oncology

## 2023-04-30 ENCOUNTER — Inpatient Hospital Stay: Payer: Medicaid Other

## 2023-04-30 ENCOUNTER — Encounter: Payer: Self-pay | Admitting: Oncology

## 2023-04-30 ENCOUNTER — Inpatient Hospital Stay (HOSPITAL_BASED_OUTPATIENT_CLINIC_OR_DEPARTMENT_OTHER): Payer: Medicaid Other | Admitting: Oncology

## 2023-04-30 VITALS — BP 126/73 | HR 80 | Temp 98.1°F | Resp 18 | Wt 122.0 lb

## 2023-04-30 DIAGNOSIS — D508 Other iron deficiency anemias: Secondary | ICD-10-CM

## 2023-04-30 DIAGNOSIS — I2699 Other pulmonary embolism without acute cor pulmonale: Secondary | ICD-10-CM

## 2023-04-30 DIAGNOSIS — E538 Deficiency of other specified B group vitamins: Secondary | ICD-10-CM | POA: Diagnosis not present

## 2023-04-30 LAB — PROTIME-INR
INR: 2.2 — ABNORMAL HIGH (ref 0.8–1.2)
Prothrombin Time: 24.6 s — ABNORMAL HIGH (ref 11.4–15.2)

## 2023-04-30 NOTE — Progress Notes (Signed)
Hematology/Oncology Progress note [phone] [phone]      CHIEF COMPLAINTS/REASON FOR VISIT:  Follow up for bilateral pulmonary embolism, due to nephrotic syndrome.    ASSESSMENT & PLAN:   IDA (iron deficiency anemia) Lab Results  Component Value Date   HGB 13.1 04/19/2023   TIBC 392 04/19/2023   IRONPCTSAT 7 (L) 04/19/2023   FERRITIN 30 04/19/2023    Both hemoglobin and ferritin have improved.  MCV is trending down. I recommend patient to resume Vitron C 1 tab daily.  Vitamin B12 deficiency Recommend patient to continue  B12 supplementation.   Pulmonary embolism (HCC) Pulmonary embolism due to hypercoagulability secondary to nephrotic syndrome/hypoalbuminemia She received mechanical embolectomy during last admission and had a recurrent pulmonary embolism despite taking Eliquis.  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 Recommend patient to continue coumadin with goal of INR 2-3 Coumadin dosage has been adjusted to 8mg  due to recent start of bactrim PCP prefers hematology to continue manage her coumadin dosing.  Today's INR is 2.2, I recommend patient to continue  coumadin 12mg  daily coumadin daily.    Orders Placed This Encounter  Procedures   Protime-INR    Standing Status:   Future    Number of Occurrences:   1    Standing Expiration Date:   04/29/2024   CBC with Differential (Cancer Center Only)    Standing Status:   Future    Standing Expiration Date:   04/29/2024   Vitamin B12    Standing Status:   Future    Standing Expiration Date:   04/29/2024   Protime-INR    Standing Status:   Future    Standing Expiration Date:   04/29/2024   Follow up 4 weeks.  All questions were answered. The patient knows to call the clinic with any problems, questions or concerns.  Rickard Patience, MD, PhD [facility] Hematology Oncology 04/30/2023   HISTORY OF PRESENTING ILLNESS:    Catherine [name] is a  22 y.o.  female presents for follow up of bilateral pulmonary embolism 06/30/2022 - 07/05/2022 patient was admitted at [facility] due to AKI on CKD, proteinuria, hypoalbuminemia Ultrasound of bilateral lower extremity was negative for DVT. Patient was started on Eliquis 5 mg twice daily for prophylactic anticoagulation due to the concern of hypercoagulability secondary to hypoalbuminemia/nephrotic syndrome.  Patient took 30 days of Eliquis and ran out after that.   08/22/2022 - 08/25/2022, patient was hospitalized at [facility] due to shortness of breath, 08/22/2021, CT chest PE: Showed acute submassive PE with right heart strain.  Multifocal bilateral upper lobe nodules-favor infectious process.  Multifocal groundglass opacities with subpleural sparing likely reflects combination of pulmonary hemorrhage and/for any infectious or inflammatory etiology. Patient was treated with heparin drip, transition to Eliquis. She underwent thrombolysis and mechanical embolectomy by Dr. Wyn Quaker on 08/22/2022.   08/30/2022, patient returned to the emergency room due to shortness of breath. Repeat CT chest angiogram showed interval development of confluent patchy and posterior consolidation airspace disease in the right middle lobe suggesting infarct. Bulky bilateral pulmonary embolism again noted, occlusive in right middle lobe lobar branch and segmental branches to the right lower lobe, similar to prior. Medial segmental and subsegmental branches to the right lower lobe are patent on the current study despite being occluded previously. Small volume occlusive thrombus in a subsegmental right upper lobe branches similar to prior.  No change in the nonocclusive thrombus in the segmental  branches to the left lower lobe.  Enlargement of pulmonary outflow tract/main pulmonary arteries.  Cavitary lesions in the lateral and posterior right upper lobe.  Nodular and patchy areas of consolidative airspace disease Patient  was restarted on heparin drip and was transitioned to Lovenox 1mg /kg BID bridging to coumadin with INR goal 2-3  Cavitary lesion, Patient was seen by infectious disease physician for pulmonary infiltrates.  Pulmonologist does not recommend bronchoscopy due to high risk.   10/18/2022 - 11/05/2022.  Patient was hospitalized, initially at [facility] for septic shock presumably from pneumonia, she required Levophed vasopressor, covered with vancomycin and cefepime, azithromycin.  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 She tolerated procedure.  Patient was on heparin drip which bridged to Eliquis at discharge. She is currently taking Sildenafil   10/26/22 right superior segment wedge resection showed - Mild to moderate pulmonary arterial hypertension with focal intraluminal organized thrombi and recanalization; features consistent with thromboembolic pulmonary hypertension.  - Patchy interstitial fibrosis with associated intraalveolar macrophages. See comment. - Granulomatous inflammation not identified  She followed up with pulmonology and was seen by Dr.Dgayli on 11/17/2022. There was concern that the pathogen was not ideal choice for her. Dr.Dgayli communicated with with [facility] team and Dr.Rajagopal agrees that pulmonary is likely a better choice for her situation.  Patient today presents for transition to pulmonary.  She reports feeling well and be compliant on all her medications.  She is alone by herself today.  Her parents have been very involved in her care in the past.  I offered to call her parents during the visit and patient declined. She denies shortness of breath, chest pain.  She has  also now establish care with primary care provider.  INTERVAL HISTORY Catherine [name] is a 22 y.o. female who has above history reviewed by me today presents for follow up visit for recurrent bilateral pulmonary embolism.  She is on coumadin 12 mg daily, reports compliance.  She is now off Bactrim for pcp prophylaxis.  No new complaints. No bleeding events.    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   Nephrotic syndrome 08/23/2019   Pulmonary embolism (HCC) 08/30/2022   Sepsis associated hypotension (HCC) 10/14/2022   Severe sepsis with septic shock (CODE) (HCC) 09/26/2021   Tobacco dependence     SURGICAL HISTORY: Past Surgical History:  Procedure Laterality Date   PULMONARY THROMBECTOMY Bilateral 08/22/2022   Procedure: PULMONARY THROMBECTOMY;  Surgeon: Annice Needy, MD;  Location: [facility] INVASIVE CV LAB;  Service: Cardiovascular;  Laterality: Bilateral;    SOCIAL HISTORY: 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  Tobacco Use   Smoking status: Former    Types: E-cigarettes    Quit date: 07/2022    Years since quitting: 0.8   Smokeless tobacco: Never  Tobacco comments:    Quit substance use since the beginning of the year   Vaping Use   Vaping status: Former  Substance and Sexual Activity   Alcohol use: Not Currently   Drug use: Yes    Types: Marijuana    Comment: Quit using marijuana approx earlier this year   Sexual activity: Not Currently  Other Topics Concern   Not on file  Social History Narrative   Not on file   Social Determinants of Health    Financial Resource Strain: Patient Declined (10/18/2022)   Received from [facility], [facility]   Overall Financial Resource Strain (CARDIA)    Difficulty of Paying Living Expenses: Patient declined  Food Insecurity: Patient Declined (10/18/2022)   Received from [facility], [facility]   Hunger Vital Sign    Worried About Running Out of Food in the Last Year: Patient declined    Ran Out of Food in the Last Year: Patient declined  Transportation Needs: No Transportation Needs (10/19/2022)   Received from [facility], [facility]   PRAPARE - Transportation    In the past 12 months, has lack of transportation kept you from medical appointments or from getting medications?: No    Lack of Transportation (Non-Medical): No  Physical Activity: Not on file  Stress: Not on file  Social Connections: Not on file  Intimate Partner Violence: Not At Risk (10/14/2022)   Humiliation, Afraid, Rape, and Kick questionnaire    Fear of Current or Ex-Partner: No    Emotionally Abused: No    Physically Abused: No    Sexually Abused: No    FAMILY HISTORY: History reviewed. No pertinent family history.  ALLERGIES:  has No Known Allergies.  MEDICATIONS:  Current Outpatient Medications  Medication Sig Dispense Refill   acetaminophen (TYLENOL) 325 MG tablet Take 2 tablets (650 mg total) by mouth every 6 (six) hours as needed for mild pain or headache. 20 tablet 0   atorvastatin (LIPITOR) 40 MG tablet Take 40 mg by mouth daily.     BD INSULIN SYRINGE U/F 31G X 5/16" 0.3 ML MISC SMARTSIG:Injection 4 Times Daily 100 each 6   blood glucose meter kit and supplies Dispense based on patient and insurance preference. Use up to four times daily as directed. (FOR ICD-10 E10.9, E11.9). 1 each 0   calcitRIOL (ROCALTROL) 0.25 MCG capsule Take 1 capsule (0.25 mcg total) by mouth as directed. Take 1 capsule (0.25  mcg) three times a week. 60 capsule 2   clotrimazole (CLOTRIMAZOLE ANTI-FUNGAL) 1 % cream Apply 1 Application topically 2 (two) times daily. 30 g 0   cyanocobalamin (VITAMIN B12) 1000 MCG tablet Take 1 tablet (1,000 mcg total) by mouth daily. 30 tablet 2   famotidine (PEPCID) 20 MG tablet Take 20 mg by mouth daily as needed for indigestion or heartburn.     hydrocortisone 2.5 % ointment Apply topically 2 (two) times daily. 30 g 0   hydrOXYzine (VISTARIL) 25 MG capsule Take 1 capsule (25 mg total) by mouth 3 (three) times daily as needed for anxiety. 60 capsule 2   insulin aspart (NOVOLOG) 100 UNIT/ML injection Inject 5-8 Units into the skin 3 (three) times daily before meals. 10 mL 2   Iron-Vitamin C 65-125 MG TABS Take 1 tablet by mouth daily. 30 tablet 2   LANTUS 100 UNIT/ML injection Inject 0.15 mLs (15 Units total) into the skin at bedtime. 4.5 mL 2  Multiple Vitamin (MULTIVITAMIN WITH MINERALS) TABS tablet Take 1 tablet by mouth daily.     ondansetron (ZOFRAN-ODT) 4 MG disintegrating tablet Take 4 mg by mouth every 8 (eight) hours as needed for nausea.     oxyCODONE (OXY IR/ROXICODONE) 5 MG immediate release tablet Take 5 mg by mouth every 6 (six) hours as needed for breakthrough pain.     predniSONE (DELTASONE) 10 MG tablet 10 mg as needed.     sildenafil (REVATIO) 20 MG tablet Take 1 tablet (20 mg total) by mouth daily. 90 tablet 1   sulfamethoxazole-trimethoprim (BACTRIM DS) 800-160 MG tablet Take 1 tablet by mouth 3 (three) times a week. 45 tablet 0   torsemide (DEMADEX) 20 MG tablet Take 1 tablet (20 mg total) by mouth daily. (Patient taking differently: Take 20 mg by mouth as needed.) 30 tablet 0   traZODone (DESYREL) 50 MG tablet Take 0.5-1 tablets (25-50 mg total) by mouth at bedtime as needed for sleep. 30 tablet 0   Vitamin D, Ergocalciferol, (DRISDOL) 1.25 MG (50000 UNIT) CAPS capsule Take 50,000 Units by mouth every 7 (seven) days.     warfarin (COUMADIN) 1 MG tablet Take 1 tablet  (1 mg total) by mouth See admin instructions. Follow MD instruction for coumadin dosing. 60 tablet 0   warfarin (COUMADIN) 5 MG tablet Take 2 tablets (10 mg total) by mouth daily. 60 tablet 5   glucagon 1 MG injection Inject 1 mg into the muscle as needed. (Patient not taking: Reported on 02/20/2023)     No current facility-administered medications for this visit.    Review of Systems  Constitutional:  Negative for appetite change, chills, fatigue and fever.  HENT:   Negative for hearing loss and voice change.   Eyes:  Negative for eye problems.  Respiratory:  Negative for chest tightness and cough.   Cardiovascular:  Negative for chest pain.  Gastrointestinal:  Negative for abdominal distention, abdominal pain and blood in stool.  Endocrine: Negative for hot flashes.  Genitourinary:  Negative for difficulty urinating and frequency.   Musculoskeletal:  Negative for arthralgias.  Skin:  Negative for itching and rash.  Neurological:  Negative for extremity weakness.  Hematological:  Negative for adenopathy.  Psychiatric/Behavioral:  Negative for confusion.    PHYSICAL EXAMINATION:  Vitals:   04/30/23 1414  BP: 126/73  Pulse: 80  Resp: 18  Temp: 98.1 F (36.7 C)  SpO2: 98%   Filed Weights   04/30/23 1414  Weight: 122 lb (55.3 kg)    Physical Exam Constitutional:      General: She is not in acute distress. HENT:     Head: Normocephalic and atraumatic.  Eyes:     General: No scleral icterus. Cardiovascular:     Rate and Rhythm: Normal rate.  Pulmonary:     Effort: Pulmonary effort is normal. No respiratory distress.  Abdominal:     General: Bowel sounds are normal. There is no distension.     Palpations: Abdomen is soft.  Musculoskeletal:        General: No deformity. Normal range of motion.     Cervical back: Normal range of motion and neck supple.  Skin:    General: Skin is warm and dry.     Findings: No erythema or rash.  Neurological:     Mental Status: She is  alert and oriented to person, place, and time. Mental status is at baseline.  Psychiatric:        Mood and Affect: Mood normal.  LABORATORY DATA:  I have reviewed the data as listed    Latest Ref Rng & Units 04/19/2023    2:05 PM 04/04/2023   11:08 AM 03/21/2023    9:53 AM  CBC  WBC 4.0 - 10.5 K/uL 6.7  6.9  7.0   Hemoglobin 12.0 - 15.0 g/dL 74.2  59.5  63.8   Hematocrit 36.0 - 46.0 % 39.2  39.5  37.6   Platelets 150 - 400 K/uL 365  336  315       Latest Ref Rng & Units 03/21/2023    9:53 AM 02/20/2023   10:48 AM 10/18/2022    5:47 AM  CMP  Glucose 70 - 99 mg/dL 756  433  295   BUN 6 - 20 mg/dL 30  34  47   Creatinine 0.44 - 1.00 mg/dL 1.88  4.16  6.06   Sodium 135 - 145 mmol/L 135  132  133   Potassium 3.5 - 5.1 mmol/L 4.1  4.2  4.4   Chloride 98 - 111 mmol/L 109  106  108   CO2 22 - 32 mmol/L 22  18  20    Calcium 8.9 - 10.3 mg/dL 8.9  9.1  7.8   Total Protein 6.5 - 8.1 g/dL 7.0  7.9    Total Bilirubin 0.3 - 1.2 mg/dL 0.5  0.4    Alkaline Phos 38 - 126 U/L 103  113    AST 15 - 41 U/L 18  29    ALT 0 - 44 U/L 14  26        RADIOGRAPHIC STUDIES: I have personally reviewed the radiological images as listed and agreed with the findings in the report. No results found.

## 2023-04-30 NOTE — Assessment & Plan Note (Signed)
Pulmonary embolism due to hypercoagulability secondary to nephrotic syndrome/hypoalbuminemia She received mechanical embolectomy during last admission and had a recurrent pulmonary embolism despite taking Eliquis.  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 Recommend patient to continue coumadin with goal of INR 2-3 Coumadin dosage has been adjusted to 8mg  due to recent start of bactrim PCP prefers hematology to continue manage her coumadin dosing.  Today's INR is 2.2, I recommend patient to continue  coumadin 12mg  daily coumadin daily.

## 2023-04-30 NOTE — Assessment & Plan Note (Addendum)
Recommend patient to continue  B12 supplementation.

## 2023-04-30 NOTE — Assessment & Plan Note (Addendum)
Lab Results  Component Value Date   HGB 13.1 04/19/2023   TIBC 392 04/19/2023   IRONPCTSAT 7 (L) 04/19/2023   FERRITIN 30 04/19/2023    Both hemoglobin and ferritin have improved.  MCV is trending down. I recommend patient to resume Vitron C 1 tab daily.

## 2023-05-01 ENCOUNTER — Telehealth: Payer: Self-pay

## 2023-05-01 NOTE — Telephone Encounter (Signed)
Please schedule lab only (INR) on 11/12 @2pm . Pt aware of appt.

## 2023-05-01 NOTE — Telephone Encounter (Signed)
-----   Message from Rickard Patience sent at 04/30/2023  5:04 PM EDT ----- INR is therapeutic, continue coumadin 12mg  daily.  Please arrange her to repeat INR in 2 weeks. Thanks.

## 2023-05-08 ENCOUNTER — Telehealth: Payer: Self-pay

## 2023-05-08 NOTE — Telephone Encounter (Unsigned)
Copied from CRM [id number]. Topic: General - Inquiry >> May 08, 2023  2:53 PM Runell Gess P wrote: Reason for CRM: Pt called saying she needs a letter stating she does not need oxygen any longer and [employer] can come pick it up.  [employer] [phone]

## 2023-05-09 ENCOUNTER — Encounter: Payer: Self-pay | Admitting: Family Medicine

## 2023-05-09 NOTE — Telephone Encounter (Signed)
Letter sent via MyChart  Additional copy for print completed in communications   Ronnald Ramp, MD

## 2023-05-14 ENCOUNTER — Other Ambulatory Visit: Payer: Self-pay

## 2023-05-14 ENCOUNTER — Other Ambulatory Visit: Payer: Medicaid Other

## 2023-05-14 DIAGNOSIS — I2699 Other pulmonary embolism without acute cor pulmonale: Secondary | ICD-10-CM

## 2023-05-15 ENCOUNTER — Inpatient Hospital Stay: Payer: Medicaid Other | Attending: Oncology

## 2023-05-15 ENCOUNTER — Other Ambulatory Visit: Payer: Self-pay

## 2023-05-15 DIAGNOSIS — I2699 Other pulmonary embolism without acute cor pulmonale: Secondary | ICD-10-CM

## 2023-05-15 LAB — PROTIME-INR
INR: 2.2 — ABNORMAL HIGH (ref 0.8–1.2)
Prothrombin Time: 24.3 s — ABNORMAL HIGH (ref 11.4–15.2)

## 2023-05-16 ENCOUNTER — Telehealth: Payer: Self-pay

## 2023-05-16 NOTE — Telephone Encounter (Signed)
-----   Message from Rickard Patience sent at 05/15/2023  8:50 PM EST ----- Stable INR, continue coumadin 12mg  daily

## 2023-05-29 ENCOUNTER — Inpatient Hospital Stay: Payer: Medicaid Other | Admitting: Oncology

## 2023-05-29 ENCOUNTER — Inpatient Hospital Stay: Payer: Medicaid Other

## 2023-06-06 ENCOUNTER — Inpatient Hospital Stay: Payer: Medicaid Other

## 2023-06-14 ENCOUNTER — Inpatient Hospital Stay: Payer: Medicaid Other | Admitting: Oncology

## 2023-07-02 ENCOUNTER — Telehealth: Payer: Medicaid Other | Admitting: Family Medicine

## 2023-07-02 ENCOUNTER — Telehealth: Payer: Self-pay | Admitting: Family Medicine

## 2023-07-23 ENCOUNTER — Inpatient Hospital Stay: Payer: Medicaid Other | Admitting: Oncology

## 2023-07-23 ENCOUNTER — Inpatient Hospital Stay: Payer: Medicaid Other | Attending: Oncology

## 2023-07-23 ENCOUNTER — Encounter: Payer: Self-pay | Admitting: Oncology

## 2023-09-10 ENCOUNTER — Inpatient Hospital Stay: Attending: Oncology

## 2023-09-10 DIAGNOSIS — I2699 Other pulmonary embolism without acute cor pulmonale: Secondary | ICD-10-CM

## 2023-09-10 DIAGNOSIS — Z86711 Personal history of pulmonary embolism: Secondary | ICD-10-CM | POA: Diagnosis not present

## 2023-09-10 DIAGNOSIS — D509 Iron deficiency anemia, unspecified: Secondary | ICD-10-CM | POA: Insufficient documentation

## 2023-09-10 DIAGNOSIS — E538 Deficiency of other specified B group vitamins: Secondary | ICD-10-CM | POA: Insufficient documentation

## 2023-09-10 LAB — CBC WITH DIFFERENTIAL (CANCER CENTER ONLY)
Abs Immature Granulocytes: 0.01 10*3/uL (ref 0.00–0.07)
Basophils Absolute: 0.1 10*3/uL (ref 0.0–0.1)
Basophils Relative: 1 %
Eosinophils Absolute: 0.2 10*3/uL (ref 0.0–0.5)
Eosinophils Relative: 3 %
HCT: 41.4 % (ref 36.0–46.0)
Hemoglobin: 13.7 g/dL (ref 12.0–15.0)
Immature Granulocytes: 0 %
Lymphocytes Relative: 44 %
Lymphs Abs: 2.6 10*3/uL (ref 0.7–4.0)
MCH: 27.8 pg (ref 26.0–34.0)
MCHC: 33.1 g/dL (ref 30.0–36.0)
MCV: 84.1 fL (ref 80.0–100.0)
Monocytes Absolute: 0.3 10*3/uL (ref 0.1–1.0)
Monocytes Relative: 6 %
Neutro Abs: 2.8 10*3/uL (ref 1.7–7.7)
Neutrophils Relative %: 46 %
Platelet Count: 334 10*3/uL (ref 150–400)
RBC: 4.92 MIL/uL (ref 3.87–5.11)
RDW: 13.7 % (ref 11.5–15.5)
WBC Count: 5.9 10*3/uL (ref 4.0–10.5)
nRBC: 0 % (ref 0.0–0.2)

## 2023-09-10 LAB — VITAMIN B12: Vitamin B-12: 176 pg/mL — ABNORMAL LOW (ref 180–914)

## 2023-09-10 LAB — PROTIME-INR
INR: 1.3 — ABNORMAL HIGH (ref 0.8–1.2)
Prothrombin Time: 16.6 s — ABNORMAL HIGH (ref 11.4–15.2)

## 2023-09-17 ENCOUNTER — Other Ambulatory Visit: Payer: Self-pay

## 2023-09-17 ENCOUNTER — Inpatient Hospital Stay: Admitting: Oncology

## 2023-09-17 ENCOUNTER — Telehealth: Payer: Self-pay | Admitting: Oncology

## 2023-09-17 NOTE — Telephone Encounter (Signed)
 Patient called and said she missed her appointment today. She is requesting appointment Thursday before 11. Due to schedule please advise when to get her scheduled  Call back [phone]

## 2023-09-17 NOTE — Telephone Encounter (Signed)
 Ok to add on Thurs morning and Ok to use chemo slot. Add labs after MD

## 2023-09-17 NOTE — Telephone Encounter (Signed)
 She had labs last on 3/10, will she need repeat labs ?

## 2023-09-20 ENCOUNTER — Inpatient Hospital Stay

## 2023-09-20 ENCOUNTER — Inpatient Hospital Stay: Admitting: Oncology

## 2023-09-21 ENCOUNTER — Encounter: Payer: Self-pay | Admitting: Oncology

## 2023-09-29 IMAGING — DX DG CHEST 1V PORT
1 series · 1 of 1 positions shown · non-contrast
Comparison: Prior chest radiographs 09/19/2021 and earlier.

CLINICAL DATA: Provided history: Tachypnea, tachycardia. Abdominal
pain and nausea. History of diabetes.

EXAM:
PORTABLE CHEST 1 VIEW

[chest ap]
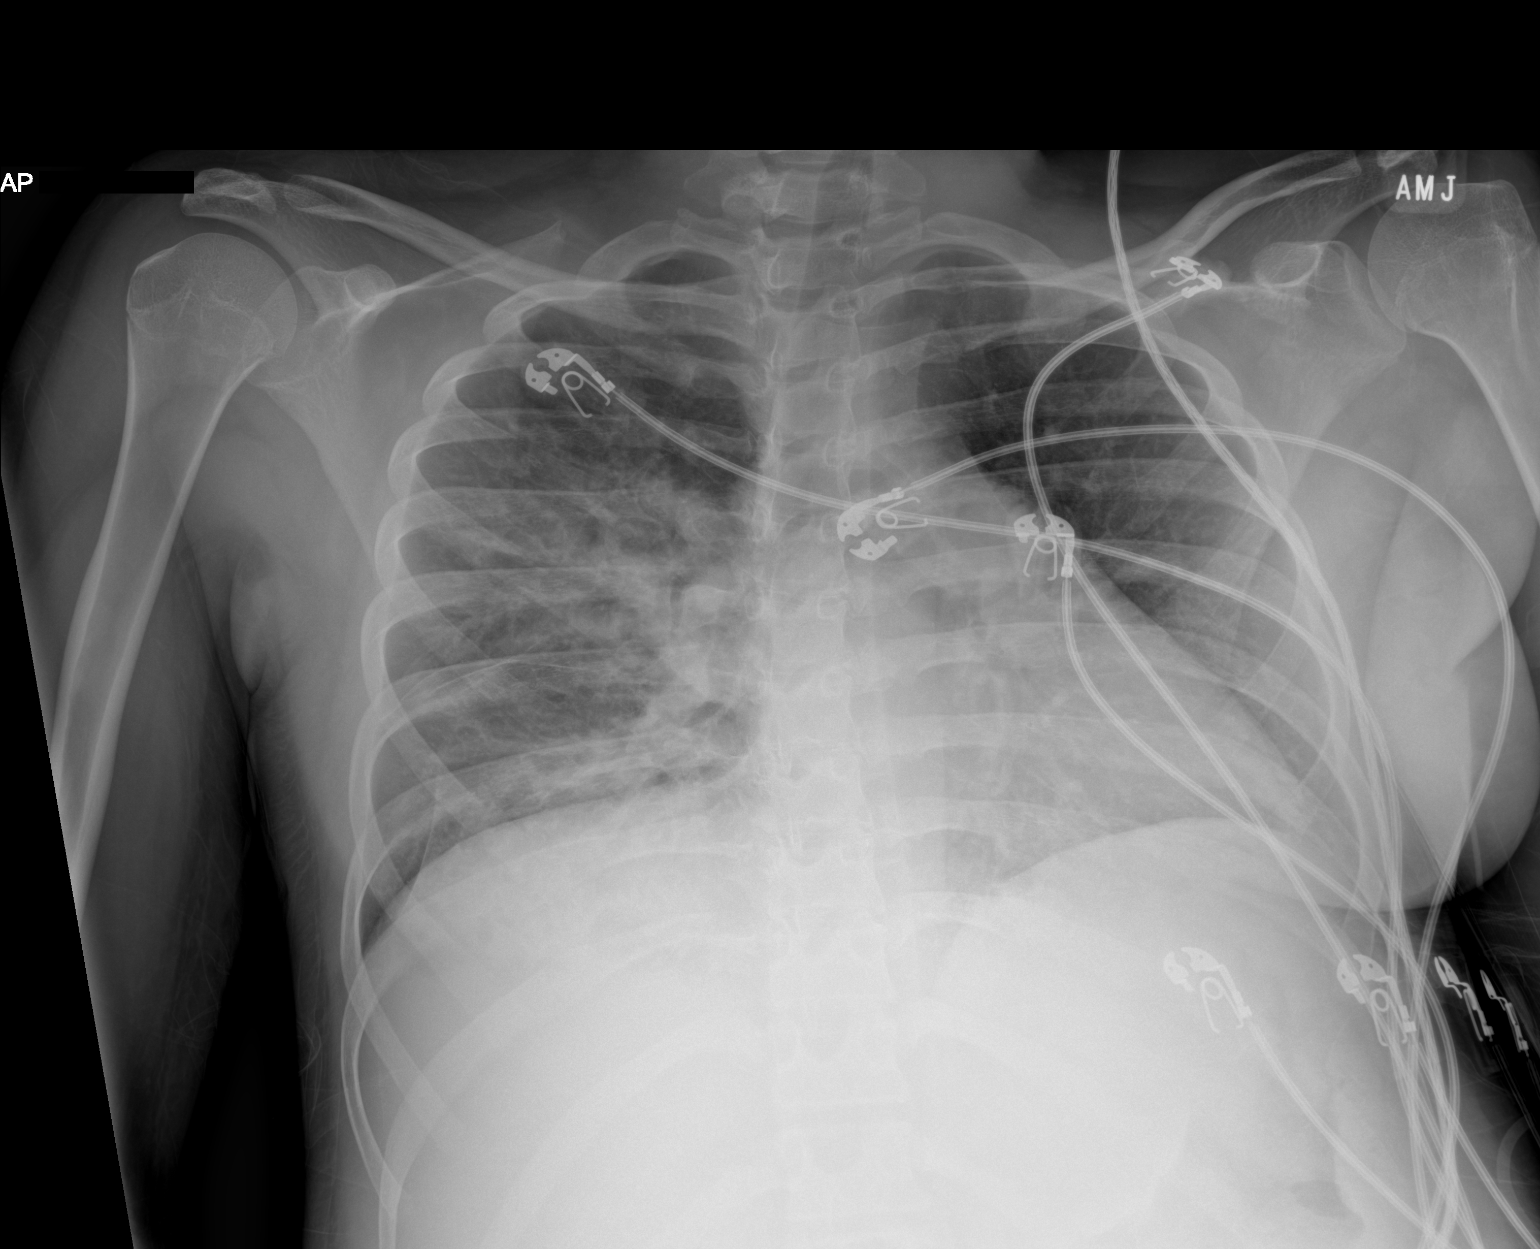

[1 of 1 positions shown; findings below may reference images not displayed]

FINDINGS: Heart size within normal limits. New from the prior chest
radiographs of 09/19/2021, there is airspace disease within the
right lung, greatest within the right mid-to-lower lung fields. No
appreciable airspace consolidation within the left lung. No evidence
of pleural effusion or pneumothorax. No acute bony abnormality
identified.
IMPRESSION: New from the prior chest radiographs of 09/19/2021, there is
airspace disease within the right lung (greatest within the right
mid-to-lower lung fields).

## 2023-09-30 IMAGING — DX DG CHEST 1V PORT
1 series · 1 of 1 positions shown · non-contrast
Comparison: 09/26/2021

CLINICAL DATA: Acute respiratory failure, diabetes

EXAM:
PORTABLE CHEST 1 VIEW

[chest ap]
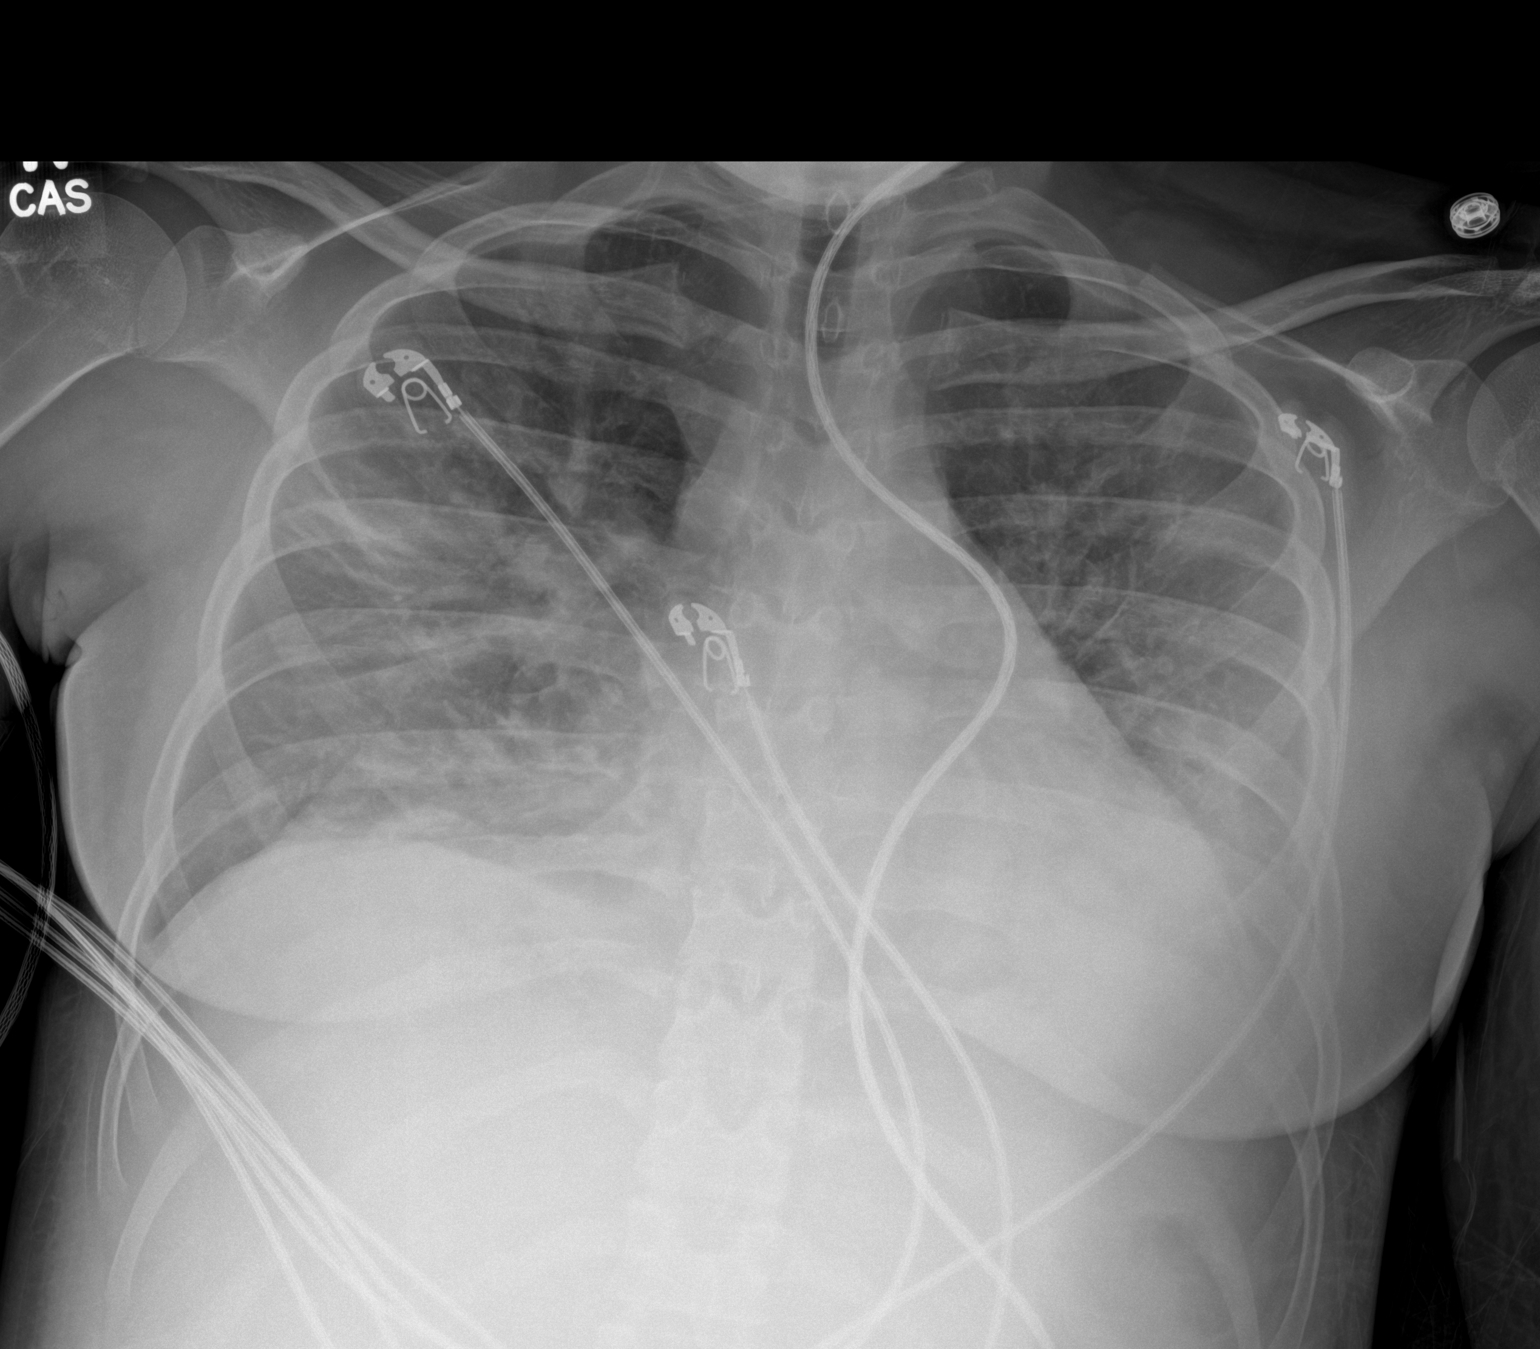

[1 of 1 positions shown; findings below may reference images not displayed]

FINDINGS: Slight worsening mid and lower lung patchy areas of airspace disease
and consolidation with further obscuration of the hemidiaphragms
concerning for bilateral pneumonia. Difficult to exclude small
developing effusions. Stable heart size and vascularity. No
pneumothorax. Trachea midline. No acute osseous finding.
IMPRESSION: Worsening patchy bilateral pneumonia pattern

## 2023-10-01 IMAGING — US US RENAL ARTERY DUPLEX
2 series · 13 of 25 positions shown · non-contrast
Comparison: None.

CLINICAL DATA: 20-year-old female admitted for pneumonia, group B
Streptococcus bacteremia, possible renal vein thrombosis

EXAM:
RENAL/URINARY TRACT ULTRASOUND
RENAL DUPLEX ULTRASOUND

[Series 1: us renal artery duplex limited · 11 of 70 slices shown]
[im 1/70]
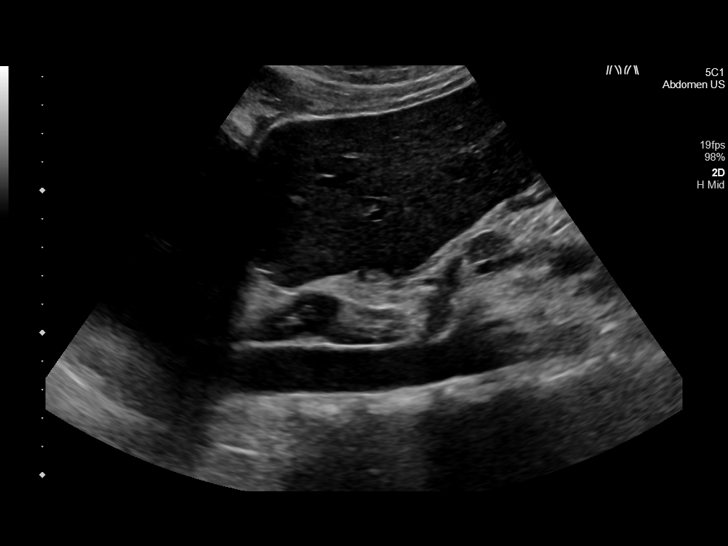
[im 7/70]
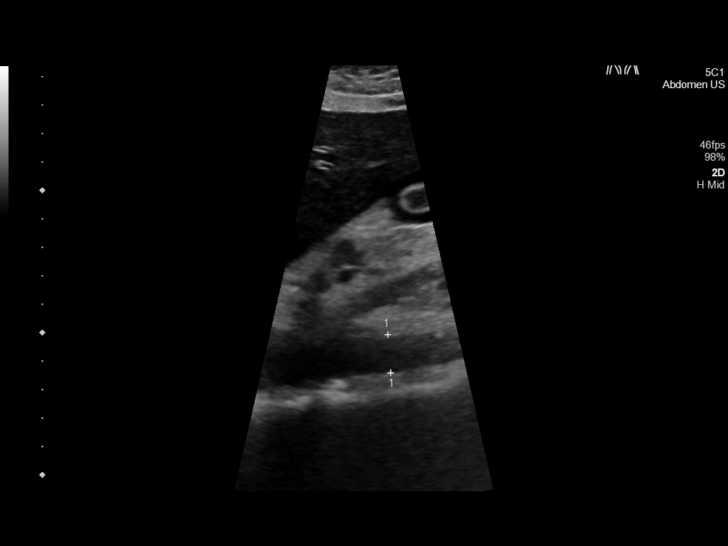
[im 14/70]
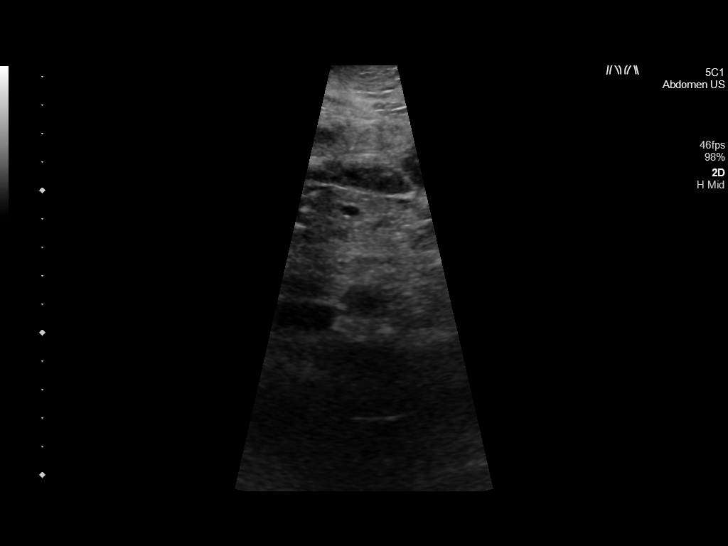
[im 21/70]
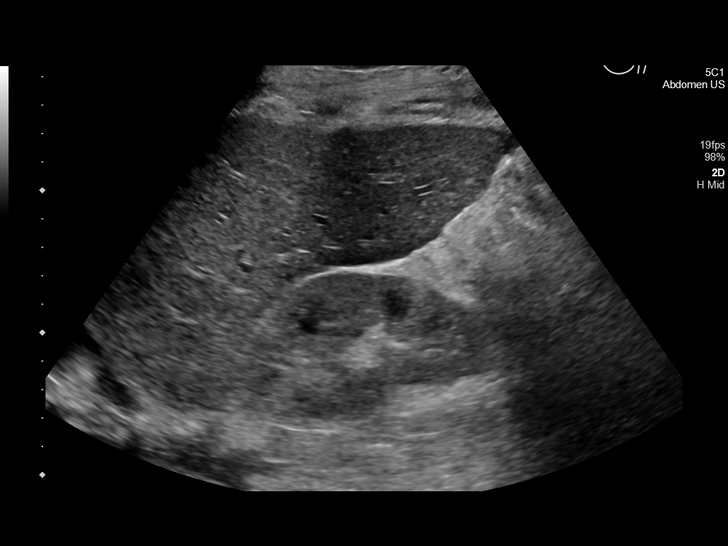
[im 28/70]
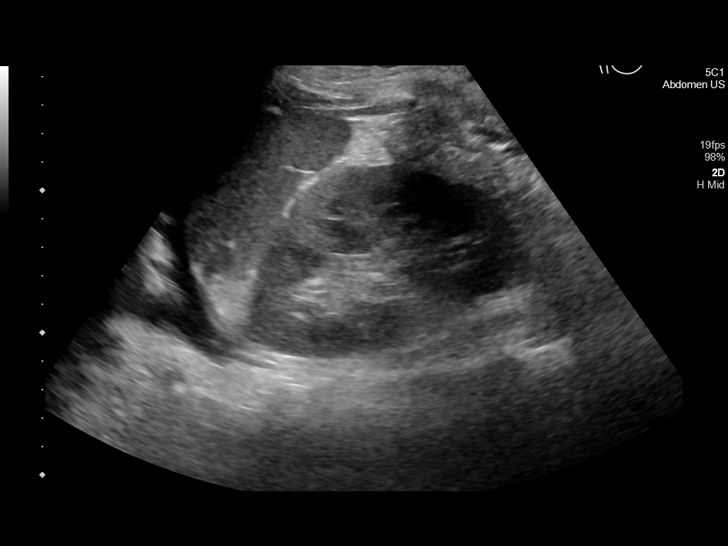
[im 35/70]
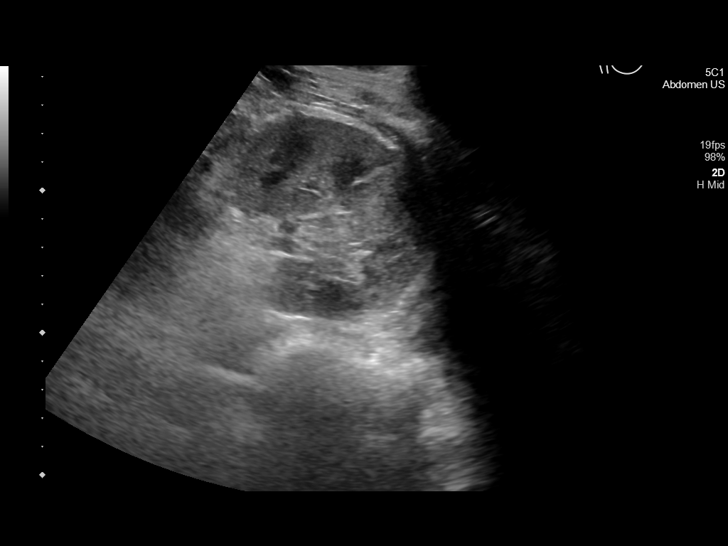
[im 42/70]
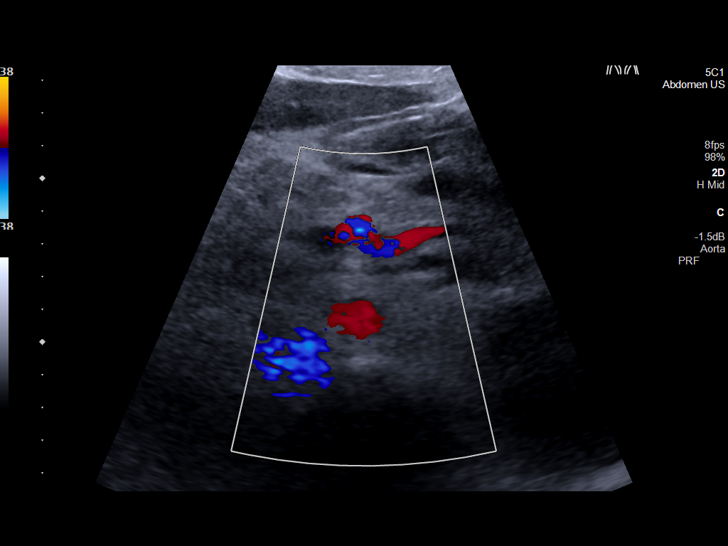
[im 49/70]
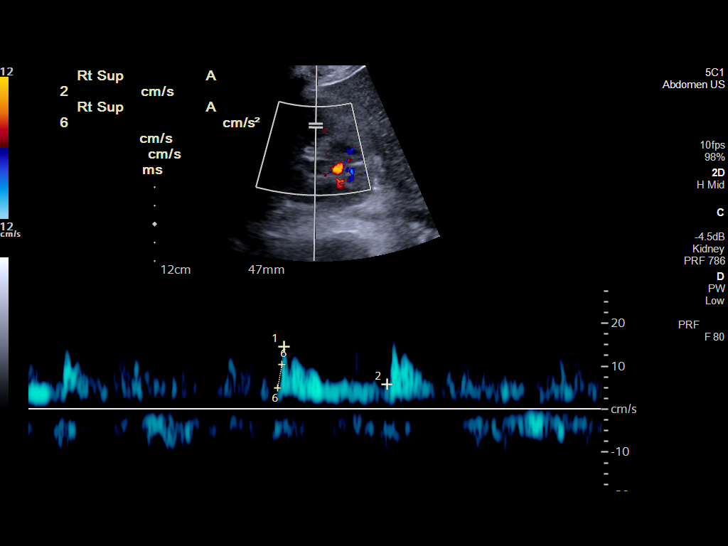
[im 56/70]
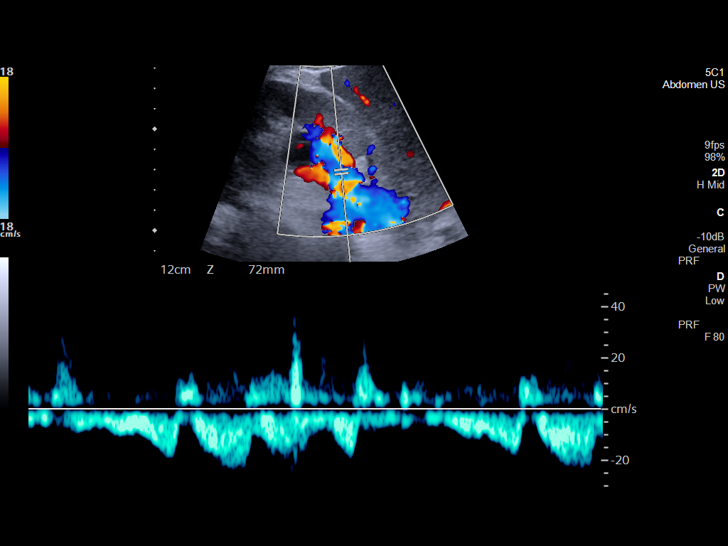
[im 63/70]
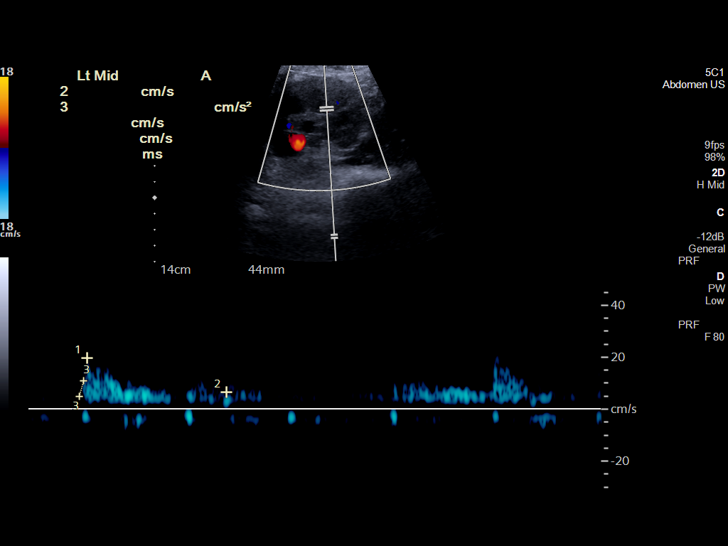
[im 70/70]
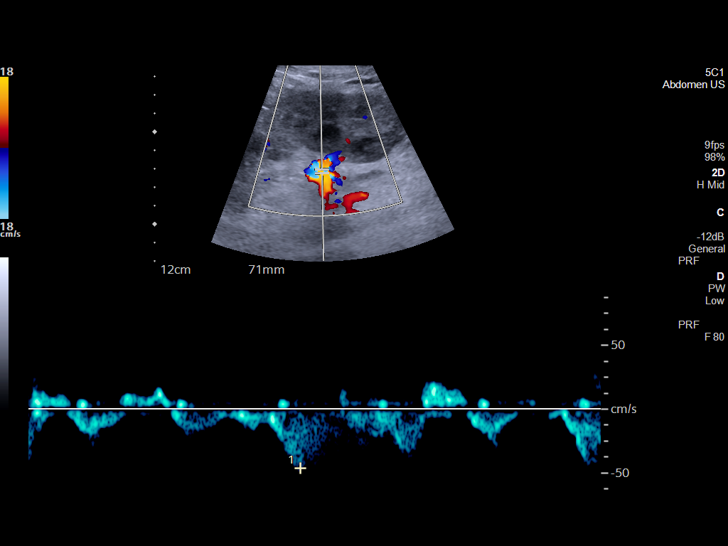

[Series 1001: us renal artery duplex · 2 of 13 slices shown]
[im 5/13]
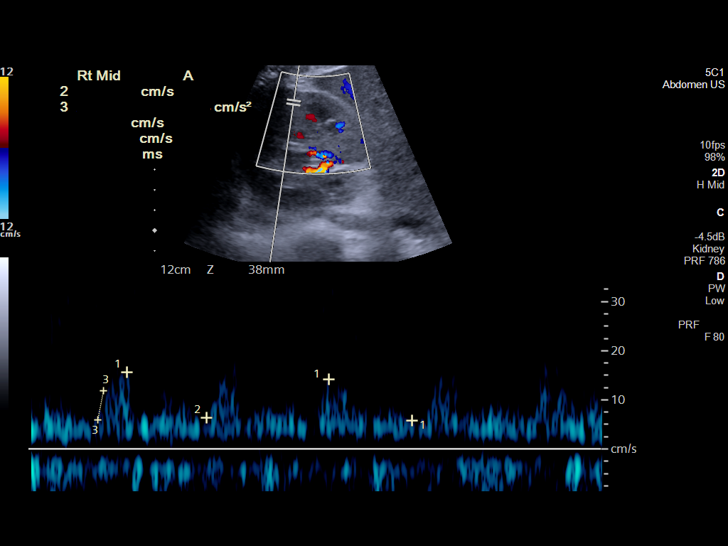
[im 13/13]
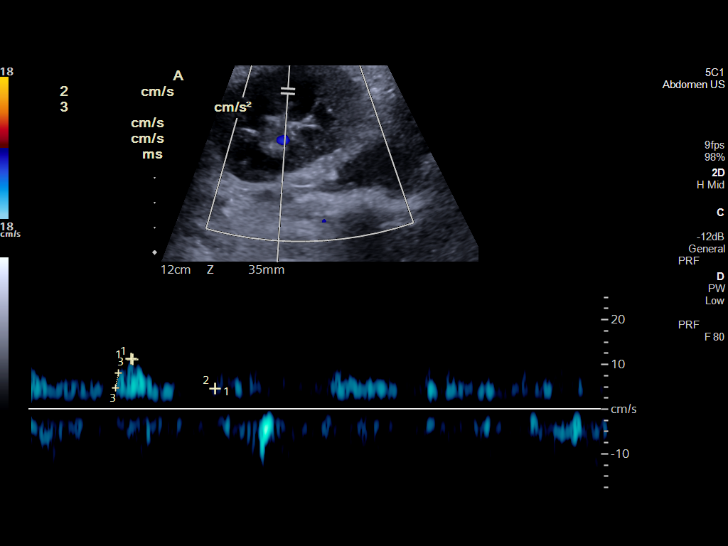

[13 of 25 positions shown; findings below may reference images not displayed]

FINDINGS: Right Kidney:

Length: 10.4 cm. Echogenicity similar to that of the adjacent liver.
No hydronephrosis. Flow in the hilum.

Left Kidney:

Length: 11.8 cm. Symmetric echogenicity to the right kidney. No
hydronephrosis. Flow in the hilum.

Bladder:  Unremarkable

RENAL DUPLEX ULTRASOUND

Right Renal Artery Velocities:

Origin:  56 cm/sec

Mid:  54 cm/sec

Hilum:  51 cm/sec

Interlobar:  17 cm/sec

Arcuate:  18 cm/sec

Left Renal Artery Velocities:

Origin:  53 cm/sec

Mid:  63 cm/sec

Hilum:  69 cm/sec

Interlobar:  27 cm/sec

Arcuate:  16 cm/sec

Aortic Velocity:  103 cm/sec

Right Renal-Aortic Ratios:

Origin:

Mid:

Hilum:

Interlobar:

Arcuate:

Left Renal-Aortic Ratios:

Origin:

Mid:

Hilum:

Interlobar:

Arcuate:

Additional:

Venous: Patent bilateral renal veins with respiratory phasicity and
biphasic, antegrade flow documented.

Resistive indices:

Right:

Left:
IMPRESSION: Directed duplex of the renal arteries demonstrates no evidence of
high-grade stenosis

Directed duplex of the renal veins demonstrates patent renal veins.

## 2023-10-23 ENCOUNTER — Other Ambulatory Visit: Payer: Self-pay | Admitting: Family Medicine

## 2023-10-24 NOTE — Telephone Encounter (Signed)
 Requested medication (s) are due for refill today:   Yes  Requested medication (s) are on the active medication list:   Yes  Future visit scheduled:   Yes 5/16 with Dr. Verdia Glad    Last ordered: 04/17/2023 10 ml, 2 refills  Unable to refill A1C  and OV due per protocol.   Looks like Catherine [name] receives most of care from Dr. Wilhelmenia Harada.   Not sure.   Requested Prescriptions  Pending Prescriptions Disp Refills   insulin  aspart (NOVOLOG ) 100 UNIT/ML injection [Pharmacy Med Name: INSULIN  ASPART 100/ML INJ,10ML] 10 mL 2    Sig: ADMINISTER 5 TO 8 UNITS UNDER THE SKIN THREE TIMES DAILY BEFORE MEALS     Endocrinology:  Diabetes - Insulins Failed - 10/24/2023 12:34 PM      Failed - HBA1C is between 0 and 7.9 and within 180 days    Hgb A1c MFr Bld  Date Value Ref Range Status  08/25/2022 9.1 (H) 4.8 - 5.6 % Final    Comment:    (NOTE) Pre diabetes:          5.7%-6.4%  Diabetes:              >6.4%  Glycemic control for   <7.0% adults with diabetes          Failed - Valid encounter within last 6 months    Recent Outpatient Visits   None

## 2023-11-09 ENCOUNTER — Encounter (HOSPITAL_COMMUNITY): Payer: Self-pay

## 2023-11-16 ENCOUNTER — Ambulatory Visit: Admitting: Family Medicine

## 2023-12-21 ENCOUNTER — Ambulatory Visit: Payer: Self-pay

## 2023-12-21 NOTE — Telephone Encounter (Signed)
 FYI Only or Action Required?: FYI only for provider.  Patient was last seen in primary care on 01/31/2023 by Mimi Alt, MD. Called Nurse Triage reporting Belepharitis and Eye Drainage. Symptoms began a week ago. Interventions attempted: OTC medications: tylenol  and Prescription medications: prescription eye drops. Symptoms are: bilateral upper eyelid swelling, eye redness with discharge and pain gradually worsening.  Triage Disposition: See HCP Within 4 Hours (Or PCP Triage)  Patient/caregiver understands and will follow disposition?: Yes                              Copied from CRM [id number]. Topic: Clinical - Red Word Triage >> Dec 21, 2023 11:37 AM El Gravely T wrote: Red Word that prompted transfer to Nurse Triage: Patient states both eyes are swelling, and pain, with some pus drainage. Reason for Disposition  MODERATE eye pain (e.g., interferes with normal activities)  Answer Assessment - Initial Assessment Questions 1. EYE DISCHARGE: Is the discharge in one or both eyes? What color is it? How much is there? When did the discharge start?      She states it started with the left first and now both.  Discharge started yesterday. She states it is a little bit at a time. She states she had the same symptoms last week and went to urgent care.  2. REDNESS OF SCLERA: Is the redness in one or both eyes? When did the redness start?      Both eyes, little bit of redness.  3. EYELIDS: Are the eyelids red or swollen? If Yes, ask: How much?      Top eyelids on both eyes swollen with redness. She states she can open her eyes, they are not swollen shut.  4. VISION: Is there any difficulty seeing clearly?      She states she can see clearly.  5. PAIN: Is there any pain? If Yes, ask: How bad is it? (Scale 1-10; or mild, moderate, severe)    - MILD (1-3): doesn't interfere with normal activities     - MODERATE (4-7): interferes with normal  activities or awakens from sleep    - SEVERE (8-10): excruciating pain, unable to do any normal activities       6/10.  6. CONTACT LENS: Do you wear contacts?     No.  7. OTHER SYMPTOMS: Do you have any other symptoms? (e.g., fever, runny nose, cough)     Patient denies runny nose, cough, fever.  8. PREGNANCY: Is there any chance you are pregnant? When was your last menstrual period?     LMP: a week ago.  Protocols used: Eye - Pus or Discharge-A-AH

## 2024-01-17 ENCOUNTER — Ambulatory Visit: Admitting: Family Medicine

## 2024-01-25 ENCOUNTER — Ambulatory Visit: Admitting: Family Medicine

## 2024-01-28 ENCOUNTER — Encounter: Payer: Self-pay | Admitting: Family Medicine

## 2024-01-28 ENCOUNTER — Ambulatory Visit: Admitting: Family Medicine

## 2024-01-28 ENCOUNTER — Other Ambulatory Visit (HOSPITAL_COMMUNITY)
Admission: RE | Admit: 2024-01-28 | Discharge: 2024-01-28 | Disposition: A | Discharge status: Home / Self Care | Source: Ambulatory Visit | Attending: Family Medicine | Admitting: Family Medicine

## 2024-01-28 VITALS — BP 103/75 | HR 85 | Temp 99.1°F | Ht 59.0 in | Wt 125.8 lb

## 2024-01-28 DIAGNOSIS — R87612 Low grade squamous intraepithelial lesion on cytologic smear of cervix (LGSIL): Secondary | ICD-10-CM

## 2024-01-28 DIAGNOSIS — F419 Anxiety disorder, unspecified: Secondary | ICD-10-CM

## 2024-01-28 DIAGNOSIS — Z7251 High risk heterosexual behavior: Secondary | ICD-10-CM | POA: Diagnosis not present

## 2024-01-28 DIAGNOSIS — L03211 Cellulitis of face: Secondary | ICD-10-CM

## 2024-01-28 MED ORDER — AMOXICILLIN-POT CLAVULANATE 875-125 MG PO TABS: 1.0000 | ORAL_TABLET | Freq: Two times a day (BID) | ORAL | 0 refills | Status: AC

## 2024-01-28 NOTE — Patient Instructions (Signed)
Caryn Section, MD, PA [address], [place], [place] [address] [phone]

## 2024-01-28 NOTE — Progress Notes (Signed)
 ACUTE VISIT   Patient: Catherine [name]   DOB: 01-14-2001   22 y.o. Female  MRN: [id number]   PCP: Sharma Coyer, MD  Chief Complaint  Patient presents with   Acute Visit    STD testing and has a painful spot below her nose down to her top left side of lip, stated that it started on Friday.  States that she has not used anything new or different on her face.  Can't smile and feels real tight.   Subjective    HPI HPI     Acute Visit    Additional comments: STD testing and has a painful spot below her nose down to her top left side of lip, stated that it started on Friday.  States that she has not used anything new or different on her face.  Can't smile and feels real tight.      Last edited by Terrel Powell CROME, CMA on 01/28/2024 11:15 AM.       Discussed the use of AI scribe software for clinical note transcription with the patient, who gave verbal consent to proceed.  History of Present Illness Catherine [name] is a 23 year old female who presents with a painful facial lesion and requests STI screening.  She has a painful lesion below her nose extending to the top side of her lips, which started three days ago. The area feels tight, and she is unable to smile. Initially, it was itchy before becoming painful. She has not used any treatments except for a cold sore medication once, suspecting it might be a cold sore, as she has had one before in the same area. The pain is localized, with no known bug bites or injuries in the area.  She is interested in STI screening following a recent trip to [place]. She has not experienced any symptoms such as discharge, pain, or itching but wants to ensure there are no infections. She requests testing for gonorrhea, chlamydia, trichomonas, bacterial vaginosis, yeast, HIV, and syphilis.  She has a history of type 1 diabetes, pulmonary embolism, hypertension, nephrotic syndrome, and minimal change disease. She is currently  taking warfarin for her history of pulmonary embolism.  She wants to see a therapist, influenced by her mother's positive experience with therapy. She reports feeling anxious and seeks a referral for mental health support.  No ear pain. No discharge, pain, or itching related to STI concerns.     Medications: Outpatient Medications Prior to Visit  Medication Sig   acetaminophen  (TYLENOL ) 325 MG tablet Take 2 tablets (650 mg total) by mouth every 6 (six) hours as needed for mild pain or headache.   BD INSULIN  SYRINGE U/F 31G X 5/16 0.3 ML MISC SMARTSIG:Injection 4 Times Daily   blood glucose meter kit and supplies Dispense based on patient and insurance preference. Use up to four times daily as directed. (FOR ICD-10 E10.9, E11.9).   clotrimazole  (CLOTRIMAZOLE  ANTI-FUNGAL) 1 % cream Apply 1 Application topically 2 (two) times daily.   cyanocobalamin  (VITAMIN B12) 1000 MCG tablet Take 1 tablet (1,000 mcg total) by mouth daily. (Patient taking differently: Take 1,000 mcg by mouth as needed.)   glucagon 1 MG injection Inject 1 mg into the muscle as needed.   hydrocortisone  2.5 % ointment Apply topically 2 (two) times daily.   hydrOXYzine  (VISTARIL ) 25 MG capsule Take 1 capsule (25 mg total) by mouth 3 (three) times daily as needed for anxiety.   insulin  aspart (NOVOLOG ) 100 UNIT/ML  injection ADMINISTER 5 TO 8 UNITS UNDER THE SKIN THREE TIMES DAILY BEFORE MEALS   LANTUS  100 UNIT/ML injection Inject 0.15 mLs (15 Units total) into the skin at bedtime.   ondansetron  (ZOFRAN -ODT) 4 MG disintegrating tablet Take 4 mg by mouth every 8 (eight) hours as needed for nausea.   torsemide  (DEMADEX ) 20 MG tablet Take 1 tablet (20 mg total) by mouth daily. (Patient taking differently: Take 20 mg by mouth as needed.)   warfarin (COUMADIN ) 1 MG tablet Take 1 tablet (1 mg total) by mouth See admin instructions. Follow MD instruction for coumadin  dosing.   warfarin (COUMADIN ) 5 MG tablet Take 2 tablets (10 mg total)  by mouth daily.   atorvastatin  (LIPITOR) 40 MG tablet Take 40 mg by mouth daily. (Patient not taking: Reported on 01/28/2024)   calcitRIOL  (ROCALTROL ) 0.25 MCG capsule Take 1 capsule (0.25 mcg total) by mouth as directed. Take 1 capsule (0.25 mcg) three times a week. (Patient not taking: Reported on 01/28/2024)   famotidine  (PEPCID ) 20 MG tablet Take 20 mg by mouth daily as needed for indigestion or heartburn. (Patient not taking: Reported on 01/28/2024)   Iron -Vitamin C  65-125 MG TABS Take 1 tablet by mouth daily. (Patient not taking: Reported on 01/28/2024)   Multiple Vitamin (MULTIVITAMIN WITH MINERALS) TABS tablet Take 1 tablet by mouth daily. (Patient not taking: Reported on 01/28/2024)   oxyCODONE  (OXY IR/ROXICODONE ) 5 MG immediate release tablet Take 5 mg by mouth every 6 (six) hours as needed for breakthrough pain. (Patient not taking: Reported on 01/28/2024)   predniSONE  (DELTASONE ) 10 MG tablet 10 mg as needed. (Patient not taking: Reported on 01/28/2024)   sulfamethoxazole -trimethoprim  (BACTRIM  DS) 800-160 MG tablet Take 1 tablet by mouth 3 (three) times a week. (Patient not taking: Reported on 01/28/2024)   traZODone  (DESYREL ) 50 MG tablet Take 0.5-1 tablets (25-50 mg total) by mouth at bedtime as needed for sleep. (Patient not taking: Reported on 01/28/2024)   No facility-administered medications prior to visit.        Objective    BP 103/75 (BP Location: Right Arm, Patient Position: Sitting, Cuff Size: Normal)   Pulse 85   Temp 99.1 F (37.3 C) (Oral)   Ht 4' 11 (1.499 m)   Wt 125 lb 12.8 oz (57.1 kg)   SpO2 100%   BMI 25.41 kg/m    Physical Exam Constitutional:      General: She is not in acute distress.    Appearance: Normal appearance. She is normal weight. She is not ill-appearing, toxic-appearing or diaphoretic.     Comments: Well groomed, calmly sitting   HENT:     Head:   Neurological:     Mental Status: She is alert.  Psychiatric:        Attention and Perception:  Attention and perception normal. She is attentive. She does not perceive auditory or visual hallucinations.        Mood and Affect: Mood and affect normal.        Speech: Speech normal.        Behavior: Behavior normal. Behavior is cooperative.        Thought Content: Thought content normal. Thought content is not paranoid or delusional. Thought content does not include homicidal or suicidal ideation. Thought content does not include homicidal or suicidal plan.        Judgment: Judgment normal.      Physical Exam VITALS: T- 99.0 HEENT: Swelling and tenderness below the nose extending to the upper lip.  No results found for any visits on 01/28/24.  Assessment & Plan      Assessment & Plan Facial Cellulitis Acute onset of painful swelling and tightness below the nose extending to the upper lip, started three days ago. Differential includes cellulitis possibly due to bacterial infection, potentially from a scratch or minor trauma. Consideration of antibiotic choice due to warfarin use. - Prescribe Augmentin  875 mg/125 mg twice daily for 7 days. - Advise emergency department visit if symptoms worsen, such as increased swelling, fever, or vision changes.  Sexually Transmitted Infection Screening Requested STI screening following a recent trip to [place]. Asymptomatic but desires testing for reassurance. Discussed testing options including swab and blood tests. - Order swab tests for gonorrhea, chlamydia, trichomonas, bacterial vaginosis, and yeast. - Order blood tests for HIV and syphilis.  Pap Smear Follow-up Previous Pap smear one year ago was abnormal, LSIL. Due for repeat Pap smear to monitor cervical health. - Perform repeat Pap smear and HPV genotyping.  Anxiety Expressed interest in therapy for anxiety, influenced by positive experiences reported by her mother. Discussed therapy options and referral process. - Provide contact information for Dr. Arti Kapoor for therapy  services. - Initiate referral to psychiatry for further evaluation and therapy options.      Return in about 2 weeks (around 02/11/2024) for facial cellulitis .        Rockie Agent, MD  [facility] [phone] (phone) [phone] (fax)  [facility]

## 2024-01-29 ENCOUNTER — Other Ambulatory Visit: Payer: Self-pay | Admitting: Family Medicine

## 2024-01-29 ENCOUNTER — Ambulatory Visit: Payer: Self-pay | Admitting: Family Medicine

## 2024-01-29 DIAGNOSIS — F419 Anxiety disorder, unspecified: Secondary | ICD-10-CM

## 2024-01-29 LAB — RPR: RPR Ser Ql: NONREACTIVE

## 2024-01-29 LAB — HIV ANTIBODY (ROUTINE TESTING W REFLEX): HIV Screen 4th Generation wRfx: NONREACTIVE

## 2024-01-30 ENCOUNTER — Ambulatory Visit: Admitting: Family Medicine

## 2024-02-03 ENCOUNTER — Other Ambulatory Visit: Payer: Self-pay | Admitting: Family Medicine

## 2024-02-03 DIAGNOSIS — E1065 Type 1 diabetes mellitus with hyperglycemia: Secondary | ICD-10-CM

## 2024-02-05 LAB — CYTOLOGY - PAP
Chlamydia: NEGATIVE
Comment: NEGATIVE
Comment: NEGATIVE
Comment: NEGATIVE
Comment: NORMAL
Diagnosis: UNDETERMINED — AB
High risk HPV: NEGATIVE
Neisseria Gonorrhea: NEGATIVE
Trichomonas: NEGATIVE

## 2024-02-13 ENCOUNTER — Ambulatory Visit: Admitting: Family Medicine

## 2024-03-31 ENCOUNTER — Ambulatory Visit: Admitting: Family Medicine

## 2024-04-09 ENCOUNTER — Ambulatory Visit (INDEPENDENT_AMBULATORY_CARE_PROVIDER_SITE_OTHER): Admitting: Family Medicine

## 2024-04-09 ENCOUNTER — Encounter: Payer: Self-pay | Admitting: Family Medicine

## 2024-04-09 VITALS — BP 111/77 | HR 86 | Temp 97.3°F | Ht 59.0 in | Wt 128.6 lb

## 2024-04-09 DIAGNOSIS — N926 Irregular menstruation, unspecified: Secondary | ICD-10-CM | POA: Diagnosis not present

## 2024-04-09 DIAGNOSIS — L309 Dermatitis, unspecified: Secondary | ICD-10-CM | POA: Diagnosis not present

## 2024-04-09 MED ORDER — NORETHINDRONE ACETATE 5 MG PO TABS: 5.0000 mg | ORAL_TABLET | Freq: Every day | ORAL | 0 refills | Status: DC

## 2024-04-09 MED ORDER — CETIRIZINE HCL 10 MG PO TABS
10.0000 mg | ORAL_TABLET | Freq: Every day | ORAL | 11 refills | Status: AC
Start: 2024-04-09 — End: ?

## 2024-04-09 MED ORDER — PREDNISONE 20 MG PO TABS: 20.0000 mg | ORAL_TABLET | Freq: Every day | ORAL | 0 refills | Status: DC

## 2024-04-09 NOTE — Patient Instructions (Signed)
 To keep you healthy, please keep in mind the following health maintenance items that you are due for:   Health Maintenance Due  Topic Date Due   COVID-19 Vaccine (1) Never done   FOOT EXAM  Never done   OPHTHALMOLOGY EXAM  Never done   HPV VACCINES (3 - Risk 3-dose series) 08/13/2017   DTaP/Tdap/Td (1 - Tdap) Never done   Hepatitis B Vaccines 19-59 Average Risk (2 of 3 - 19+ 3-dose series) 12/09/2019   Diabetic kidney evaluation - Urine ACR  04/18/2021   HEMOGLOBIN A1C  02/23/2023   Influenza Vaccine  02/01/2024   Diabetic kidney evaluation - eGFR measurement  03/20/2024     Best Wishes,   Dr. Lang

## 2024-04-09 NOTE — Progress Notes (Signed)
 "     Established patient visit   Patient: Catherine [name]   DOB: 10-Apr-2001   23 y.o. Female  MRN: [id number] Visit Date: 04/09/2024  Today's healthcare provider: Rockie Agent, MD   Chief Complaint  Patient presents with   Rash    Patient presents with rash on arms/ chest. Reports itching, redness and started Friday last week. Hydrocortisone  cream used for itching and seems to help but rash is still present    Subjective     HPI     Rash    Additional comments: Patient presents with rash on arms/ chest. Reports itching, redness and started Friday last week. Hydrocortisone  cream used for itching and seems to help but rash is still present       Last edited by Cherry Chiquita HERO, CMA on 04/09/2024  3:26 PM.       Discussed the use of AI scribe software for clinical note transcription with the patient, who gave verbal consent to proceed.  History of Present Illness Catherine [name] is a 23 year old female who presents with a rash on her chest and arms and mentions abnormal breakthrough bleeding on nexplanon for contraception.  The rash began last Friday and is characterized by pruritic, erythematous lesions located on her chest, arms, and slightly on her abdomen, appearing in linear and dotted patterns. Hydrocortisone  cream provides relief from itching, but the rash persists. She denies recent changes in medications, detergents, or soaps, but used a new spray while in [place], which she only used once. She stayed at someone else's house during her trip, but no one else has reported similar symptoms. No recent exposure to woods or poison ivy.  She takes several medications for her existing conditions, including Novolog , Lantus , warfarin, tacrolimus , sildenafil , and hydroxyzine  for anxiety. She also uses low-dose prednisone  as needed, but has not required it recently. Additionally, she takes atorvastatin  for cholesterol management.  She experiences irregular menstrual  cycles, with periods occurring every other week for the past month. She is currently using Nexplanon for birth control and has had it for a while. Her periods are described as 'real' and last about five days, with recent cycles occurring on the 29th of last month, the 12th, and the 24th of this month.     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   Nephrotic syndrome 08/23/2019   Pulmonary embolism (HCC) 08/30/2022   Sepsis associated hypotension (HCC) 10/14/2022   Severe sepsis with septic shock (CODE) (HCC) 09/26/2021   Tobacco dependence     Medications: Outpatient Medications Prior to Visit  Medication Sig   Continuous Glucose Sensor (DEXCOM G7 SENSOR) MISC 5 each by Other route every 14 (fourteen) days.   furosemide  (LASIX ) 20 MG tablet Take 20 mg by mouth daily.   sildenafil  (REVATIO ) 20 MG tablet Take 20 mg by mouth 3 (three) times daily.   trimethoprim -polymyxin b (POLYTRIM) ophthalmic solution Place 2 drops into both eyes every 6 (six) hours.   acetaminophen  (TYLENOL ) 325 MG tablet Take 2 tablets (650 mg total) by mouth every 6 (six) hours as needed for mild pain or headache.   atorvastatin  (LIPITOR) 40 MG tablet Take 40 mg by mouth daily. (Patient not taking: Reported on 01/28/2024)   BD INSULIN  SYRINGE ULTRAFINE 31G X 5/16 0.3 ML MISC  USE TO INJECT FOUR TIMES DAILY.   blood glucose meter kit and supplies Dispense based on patient and insurance preference. Use up to four times daily as directed. (FOR ICD-10 E10.9, E11.9).   calcitRIOL  (ROCALTROL ) 0.25 MCG capsule Take 1 capsule (0.25 mcg total) by mouth as directed. Take 1 capsule (0.25 mcg) three times a week. (Patient not taking: Reported on 01/28/2024)   clotrimazole  (CLOTRIMAZOLE  ANTI-FUNGAL) 1 % cream Apply 1 Application topically 2 (two) times  daily.   cyanocobalamin  (VITAMIN B12) 1000 MCG tablet Take 1 tablet (1,000 mcg total) by mouth daily. (Patient taking differently: Take 1,000 mcg by mouth as needed.)   glucagon 1 MG injection Inject 1 mg into the muscle as needed.   hydrocortisone  2.5 % ointment Apply topically 2 (two) times daily.   hydrOXYzine  (VISTARIL ) 25 MG capsule TAKE 1 CAPSULE(25 MG) BY MOUTH THREE TIMES DAILY AS NEEDED FOR ANXIETY   insulin  aspart (NOVOLOG ) 100 UNIT/ML injection ADMINISTER 5 TO 8 UNITS UNDER THE SKIN THREE TIMES DAILY BEFORE MEALS   Iron -Vitamin C  65-125 MG TABS Take 1 tablet by mouth daily. (Patient not taking: Reported on 01/28/2024)   LANTUS  100 UNIT/ML injection Inject 0.15 mLs (15 Units total) into the skin at bedtime.   Multiple Vitamin (MULTIVITAMIN WITH MINERALS) TABS tablet Take 1 tablet by mouth daily. (Patient not taking: Reported on 01/28/2024)   ondansetron  (ZOFRAN -ODT) 4 MG disintegrating tablet Take 4 mg by mouth every 8 (eight) hours as needed for nausea.   predniSONE  (DELTASONE ) 10 MG tablet 10 mg as needed. (Patient not taking: Reported on 01/28/2024)   sulfamethoxazole -trimethoprim  (BACTRIM  DS) 800-160 MG tablet Take 1 tablet by mouth 3 (three) times a week. (Patient not taking: Reported on 01/28/2024)   torsemide  (DEMADEX ) 20 MG tablet Take 1 tablet (20 mg total) by mouth daily. (Patient taking differently: Take 20 mg by mouth as needed.)   traZODone  (DESYREL ) 50 MG tablet Take 0.5-1 tablets (25-50 mg total) by mouth at bedtime as needed for sleep. (Patient not taking: Reported on 01/28/2024)   warfarin (COUMADIN ) 1 MG tablet Take 1 tablet (1 mg total) by mouth See admin instructions. Follow MD instruction for coumadin  dosing.   warfarin (COUMADIN ) 5 MG tablet Take 2 tablets (10 mg total) by mouth daily.   [DISCONTINUED] famotidine  (PEPCID ) 20 MG tablet Take 20 mg by mouth daily as needed for indigestion or heartburn. (Patient not taking: Reported on 01/28/2024)   [DISCONTINUED] oxyCODONE   (OXY IR/ROXICODONE ) 5 MG immediate release tablet Take 5 mg by mouth every 6 (six) hours as needed for breakthrough pain. (Patient not taking: Reported on 01/28/2024)   No facility-administered medications prior to visit.    Review of Systems      Objective    BP 111/77 (BP Location: Left Arm, Patient Position: Sitting, Cuff Size: Normal)   Pulse 86   Temp (!) 97.3 F (36.3 C) (Oral)   Ht 4' 11 (1.499 m)   Wt 128 lb 9.6 oz (58.3 kg)   LMP 04/02/2024 (Approximate)   SpO2 100%   BMI 25.97 kg/m  BP Readings from Last 3 Encounters:  04/09/24 111/77  01/28/24 103/75  04/30/23 126/73   Wt Readings from Last 3 Encounters:  04/09/24 128 lb 9.6 oz (58.3 kg)  01/28/24 125 lb 12.8 oz (57.1 kg)  04/30/23 122 lb (55.3 kg)        Physical Exam  Physical Exam SKIN: Rash present on chest, arms, and neck. No rash on legs.    No results found  for any visits on 04/09/24.  Assessment & Plan     Problem List Items Addressed This Visit   None Visit Diagnoses       Abnormal menstrual periods    -  Primary   Relevant Medications   norethindrone  (AYGESTIN ) 5 MG tablet     Dermatitis       Relevant Medications   predniSONE  (DELTASONE ) 20 MG tablet   cetirizine  (ZYRTEC ) 10 MG tablet       Assessment and Plan Assessment & Plan Contact dermatitis Contact dermatitis presenting as an itchy, red rash on the chest, arms, and neck, persistent despite hydrocortisone  cream. Possible trigger from a new spray used in [place]. Differential diagnosis includes contact dermatitis from jewelry or other allergens. Not associated with new medications, detergents, or soaps. - Prescribe prednisone  20 mg daily for 1 week. - Recommend cetirizine  10 mg daily for 2 weeks. - Advise re-evaluation if rash does not improve after completing prednisone .  Abnormal uterine bleeding Abnormal uterine bleeding with periods every other week for the past month. On second Nexplanon implant, possibly contributing  to irregular bleeding. Bleeding described as a full period lasting five days, with recent spotting. - Prescribe norethindrone  0.35 mg daily for 10 days    Work note given per patient request to return on 04/14/24 due to lack of sleep related to pruritus from the rash   Return in about 2 months (around 06/09/2024) for Chronic F/U.         Rockie Agent, MD  [facility] [phone] (phone) [phone] (fax)  [facility] "

## 2024-04-19 ENCOUNTER — Encounter: Payer: Self-pay | Admitting: Family Medicine

## 2024-04-26 ENCOUNTER — Other Ambulatory Visit: Payer: Self-pay | Admitting: Family Medicine

## 2024-04-26 DIAGNOSIS — L309 Dermatitis, unspecified: Secondary | ICD-10-CM

## 2024-04-28 ENCOUNTER — Ambulatory Visit: Payer: Self-pay

## 2024-04-28 NOTE — Telephone Encounter (Signed)
 FYI Only or Action Required?: Action required by provider: request for appointment and More prednisone .  Patient was last seen in primary care on 04/09/2024 by Sharma Coyer, MD.  Called Nurse Triage reporting Rash.  Symptoms began several weeks ago.  Interventions attempted: Prescription medications: Prednisone .  Symptoms are: unchanged.  Triage Disposition: See PCP When Office is Open (Within 3 Days)  Patient/caregiver understands and will follow disposition?: No, refuses disposition Reason for Disposition  Mild widespread rash  (Exception: Heat rash lasting 3 days or less.)  Answer Assessment - Initial Assessment Questions Patient states rash went away when taking Prednisone , and reappeared 3-5 days after finishing up prednisone . Patient states available appointments PCP has will not work for her and wants to try prednisone  again. Please advise. Callback [phone].   1. APPEARANCE of RASH: What does the rash look like? (e.g., blisters, dry flaky skin, red spots, redness, sores)     Dry, flakey  2. SIZE: How big are the spots? (e.g., tip of pen, eraser, coin; inches, centimeters)     Little spots  3. LOCATION: Where is the rash located?     Chest and arms  4. ONSET: When did the rash begin?     Over a week ago  5. FEVER: Do you have a fever? If Yes, ask: What is your temperature, how was it measured, and when did it start?     Denies  6. ITCHING: Does the rash itch? If Yes, ask: How bad is the itch? (Scale 1-10; or mild, moderate, severe)     Moderate, burns sometimes  7. CAUSE: What do you think is causing the rash?     Unsure  8. OTHER SYMPTOMS: Do you have any other symptoms? (e.g., dizziness, headache, sore throat, joint pain)       Denies  Protocols used: Rash or Redness - [facility]

## 2024-04-28 NOTE — Telephone Encounter (Signed)
 Requested medications are due for refill today.  unsure  Requested medications are on the active medications list.  no  Last refill. 04/09/2024  Future visit scheduled.   yes  Notes to clinic.  Refill not delegated.    Requested Prescriptions  Pending Prescriptions Disp Refills   predniSONE  (DELTASONE ) 20 MG tablet [Pharmacy Med Name: PREDNISONE  20MG  TABLETS] 7 tablet 0    Sig: TAKE 1 TABLET(20 MG) BY MOUTH DAILY WITH BREAKFAST FOR 7 DAYS     Not Delegated - Endocrinology:  Oral Corticosteroids Failed - 04/28/2024  2:32 PM      Failed - This refill cannot be delegated      Failed - Manual Review: Eye exam for IOP if prolonged treatment      Failed - Glucose (serum) in normal range and within 180 days    Glucose, Bld  Date Value Ref Range Status  03/21/2023 241 (H) 70 - 99 mg/dL Final    Comment:    Glucose reference range applies only to samples taken after fasting for at least 8 hours.   Glucose-Capillary  Date Value Ref Range Status  10/18/2022 348 (H) 70 - 99 mg/dL Final    Comment:    Glucose reference range applies only to samples taken after fasting for at least 8 hours.         Failed - K in normal range and within 180 days    Potassium  Date Value Ref Range Status  03/21/2023 4.1 3.5 - 5.1 mmol/L Final         Failed - Na in normal range and within 180 days    Sodium  Date Value Ref Range Status  03/21/2023 135 135 - 145 mmol/L Final         Failed - Bone Mineral Density or Dexa Scan completed in the last 2 years      Passed - Last BP in normal range    BP Readings from Last 1 Encounters:  04/09/24 111/77         Passed - Valid encounter within last 6 months    Recent Outpatient Visits           2 weeks ago Abnormal menstrual periods   [facility], Rockie, MD   3 months ago Facial cellulitis   [facility], Rockie, MD

## 2024-04-29 ENCOUNTER — Telehealth: Payer: Self-pay | Admitting: Family Medicine

## 2024-04-29 DIAGNOSIS — L309 Dermatitis, unspecified: Secondary | ICD-10-CM

## 2024-04-29 NOTE — Telephone Encounter (Signed)
 Copied from CRM [id number]. Topic: Referral - Question >> Apr 29, 2024  3:32 PM Deaijah H wrote: Reason for CRM: Patient would like to know if PCP can put in a referral for a dermatologist anywhere or back to [facility]   ----------------------------------------------------------------------- From previous Reason for Contact - Referral Request: Did the patient discuss referral with their provider in the last year?   (If No - schedule appointment) (If Yes - send message)  Appointment offered?    Type of order/referral and detailed reason for visit:   Preference of office, provider, location:   If referral order, have you been seen by this specialty before?   (If Yes, this issue or another issue? When? Where?  Can we respond through MyChart?     ----------------------------------------------------------------------- From previous Reason for Contact - Referral Question: Reason for CRM:

## 2024-04-30 NOTE — Telephone Encounter (Signed)
 Dermatology referral placed

## 2024-04-30 NOTE — Addendum Note (Signed)
 Addended by: SIMMONS-ROBINSON, Lyvia Mondesir L on: 04/30/2024 07:19 AM   Modules accepted: Orders

## 2024-05-01 ENCOUNTER — Ambulatory Visit (INDEPENDENT_AMBULATORY_CARE_PROVIDER_SITE_OTHER)

## 2024-05-01 DIAGNOSIS — R21 Rash and other nonspecific skin eruption: Secondary | ICD-10-CM

## 2024-05-01 DIAGNOSIS — L239 Allergic contact dermatitis, unspecified cause: Secondary | ICD-10-CM

## 2024-05-01 MED ORDER — TRIAMCINOLONE ACETONIDE 0.1 % EX CREA: TOPICAL_CREAM | CUTANEOUS | 2 refills | Status: DC

## 2024-05-01 MED ORDER — TRIAMCINOLONE ACETONIDE 0.1 % EX OINT: TOPICAL_OINTMENT | CUTANEOUS | 2 refills | Status: AC

## 2024-05-01 MED ORDER — CLOBETASOL PROPIONATE 0.05 % EX OINT: TOPICAL_OINTMENT | CUTANEOUS | 5 refills | Status: AC

## 2024-05-01 NOTE — Patient Instructions (Addendum)
 Moisturizer: Apply a moisturizer throughout the day and after bathing.  When you moisturize after bathing, this locks in the moisture.  This can lead to softer and smoother skin.  Body moisturizers come in ointments, creams, and lotions.  If you have dry skin, we recommend the use of ointments or creams rather than lotions.  In other words, something you scoop out of a jar rather than squirted out.  Ointments and creams are thicker and thus provide better moisturization.      Moisturizers Apply a moisturizer to your skin at least once a day (even if you do not bathe).   Cool moisturizers on your skin help with itching (to accomplish this, place your moisturizers and medicated creams in the refrigerator). In general, people with dry skin need a moisturizer that is scooped and not squirted.  - Ointments (petrolatum ointment): greasy, but are the best moisturizers Vaseline, Aquaphor  - Creams (thick, white cream that comes in a jar and is scooped with your hand) Cerave, Cetaphil, Eucerin, Vanicream  - Lotions (comes in a pump) is the weakest moisturizer but is an acceptable choice for the face if you have an oily face Cetaphil, Cerave, Curel, Neutrogena, Lubriderm, Aveeno    Due to recent changes in healthcare laws, you may see results of your pathology and/or laboratory studies on MyChart before the doctors have had a chance to review them. We understand that in some cases there may be results that are confusing or concerning to you. Please understand that not all results are received at the same time and often the doctors may need to interpret multiple results in order to provide you with the best plan of care or course of treatment. Therefore, we ask that you please give us  2 business days to thoroughly review all your results before contacting the office for clarification. Should we see a critical lab result, you will be contacted sooner.   If You Need Anything After Your Visit  If you have  any questions or concerns for your doctor, please call our main line at [phone] and press option 4 to reach your doctor's medical assistant. If no one answers, please leave a voicemail as directed and we will return your call as soon as possible. Messages left after 4 pm will be answered the following business day.   You may also send us  a message via MyChart. We typically respond to MyChart messages within 1-2 business days.  For prescription refills, please ask your pharmacy to contact our office. Our fax number is [phone].  If you have an urgent issue when the clinic is closed that cannot wait until the next business day, you can page your doctor at the number below.    Please note that while we do our best to be available for urgent issues outside of office hours, we are not available 24/7.   If you have an urgent issue and are unable to reach us , you may choose to seek medical care at your doctor's office, retail clinic, urgent care center, or emergency room.  If you have a medical emergency, please immediately call 911 or go to the emergency department.  Pager Numbers  - Dr. Hester: [phone]  - Dr. Jackquline: [phone]  - Dr. Claudene: [phone]   In the event of inclement weather, please call our main line at [phone] for an update on the status of any delays or closures.  Dermatology Medication Tips: Please keep the boxes that topical medications come in in order  to help keep track of the instructions about where and how to use these. Pharmacies typically print the medication instructions only on the boxes and not directly on the medication tubes.   If your medication is too expensive, please contact our office at [phone] option 4 or send us  a message through MyChart.   We are unable to tell what your co-pay for medications will be in advance as this is different depending on your insurance coverage. However, we may be able to find a substitute  medication at lower cost or fill out paperwork to get insurance to cover a needed medication.   If a prior authorization is required to get your medication covered by your insurance company, please allow us  1-2 business days to complete this process.  Drug prices often vary depending on where the prescription is filled and some pharmacies may offer cheaper prices.  The website www.goodrx.com contains coupons for medications through different pharmacies. The prices here do not account for what the cost may be with help from insurance (it may be cheaper with your insurance), but the website can give you the price if you did not use any insurance.  - You can print the associated coupon and take it with your prescription to the pharmacy.  - You may also stop by our office during regular business hours and pick up a GoodRx coupon card.  - If you need your prescription sent electronically to a different pharmacy, notify our office through [facility] or by phone at [phone] option 4.     Si Usted Necesita Algo Despus de Su Visita  Tambin puede enviarnos un mensaje a travs de Clinical cytogeneticist. Por lo general respondemos a los mensajes de MyChart en el transcurso de 1 a 2 das hbiles.  Para renovar recetas, por favor pida a su farmacia que se ponga en contacto con nuestra oficina. Randi lakes de fax es [place] [phone].  Si tiene un asunto urgente cuando la clnica est cerrada y que no puede esperar hasta el siguiente da hbil, puede llamar/localizar a su doctor(a) al nmero que aparece a continuacin.   Por favor, tenga en cuenta que aunque hacemos todo lo posible para estar disponibles para asuntos urgentes fuera del horario de [place], no estamos disponibles las 24 horas del da, los 7 [address]  [address] de la [place].   Si tiene un problema urgente y no puede comunicarse con nosotros, puede optar por buscar atencin mdica  en el consultorio de su doctor(a), en una clnica privada, en un centro de  atencin urgente o en una sala de emergencias.  Si tiene Engineer, drilling, por favor llame inmediatamente al 911 o vaya a la sala de emergencias.  Nmeros de bper  - Dr. Hester: [phone]  - Dra. Jackquline: [phone]  - Dr. Claudene: [phone]   En caso de inclemencias del tiempo, por favor llame a landry capes principal al [phone] para una actualizacin sobre el [place] de cualquier retraso o cierre.  Consejos para la medicacin en dermatologa: Por favor, guarde las cajas en las que vienen los medicamentos de uso tpico para ayudarle a seguir las instrucciones sobre dnde y cmo usarlos. Las farmacias generalmente imprimen las instrucciones del medicamento slo en las cajas y no directamente en los tubos del [place].   Si su medicamento es muy caro, por favor, pngase en contacto con landry rieger llamando al [phone] y presione la opcin 4 o envenos un mensaje a travs de Clinical cytogeneticist.   No podemos decirle cul ser su  copago por los medicamentos por adelantado ya que esto es diferente dependiendo de la cobertura de su seguro. Sin embargo, es posible que podamos encontrar un medicamento sustituto a Audiological scientist un formulario para que el seguro cubra el medicamento que se considera necesario.   Si se requiere una autorizacin previa para que su compaa de seguros malta su medicamento, por favor permtanos de 1 a 2 das hbiles para completar este proceso.  Los precios de los medicamentos varan con frecuencia dependiendo del Environmental consultant de dnde se surte la receta y alguna farmacias pueden ofrecer precios ms baratos.  El sitio web www.goodrx.com tiene cupones para medicamentos de Health and safety inspector. Los precios aqu no tienen en cuenta lo que podra costar con la ayuda del seguro (puede ser ms barato con su seguro), pero el sitio web puede darle el precio si no utiliz Tourist information centre manager.  - Puede imprimir el cupn correspondiente y llevarlo con su receta a la  farmacia.  - Tambin puede pasar por nuestra oficina durante el horario de atencin regular y Education officer, museum una tarjeta de cupones de GoodRx.  - Si necesita que su receta se enve electrnicamente a una farmacia diferente, informe a nuestra oficina a travs de MyChart de [place] o por telfono llamando al [phone] y presione la opcin 4.

## 2024-05-01 NOTE — Addendum Note (Signed)
 Addended by: RAYMUND DOMINO C on: 05/01/2024 03:03 PM   Modules accepted: Orders

## 2024-05-01 NOTE — Progress Notes (Signed)
    Subjective   Catherine [name] is a 23 y.o. female who presents for the following: Rash. Patient is new patient.  Today patient reports: Patient with rash at arms, chest is itchy started about a month ago. Was seen at PCP and prescribed prednisone  and allergy meds but came right back after finishing course of prednisone . Has tried OTC cortisone cream. Patient reports she is a type I diabetic and hx nephrotic syndrome. Patient denies any hx eczema.   Review of Systems:    No other skin or systemic complaints except as noted in HPI or Assessment and Plan.  The following portions of the chart were reviewed this encounter and updated as appropriate: medications, allergies, medical history  Relevant Medical History:  n/a   Objective  Well appearing patient in no apparent distress; mood and affect are within normal limits. Examination was performed of the: Focused Exam of: Arms, chest, neck   Examination notable for: erythematous papules of upper extremities. Erythematous patches of neck and chest w/ papules Examination limited by: Undergarments, Shoes or socks , Clothing, and Patient deferred removal                Assessment & Plan   Rash of uncertain etiology, favor ACD vs papular urticaria vs less likely atopic dermatitis/prurigo - undiagnosed new problem with uncertain prognosis  - Differential diagnosis, treatment options, prognosis, risk/ benefit, and side effects of treatment were discussed with the patient.  -Recommend gentle skin care. - Start clobetasol ointment 0.05% twice daily for up to 2 weeks to affected skin - for more severe areas  start triamcinolone cream 0.1% twice daily for up to 2 weeks to affected areas of skin - for more mild areas  Discussed side effect of potent topical steroids including atrophy, dyspigmentation, striae, telangectasia, folliculitis, loss of skin pigment, hair growth, tachyphylaxis, risk of systemic absorption with  missuse. -Advised patient if not improved at follow-up consider biopsy.    Level of service outlined above   Procedures, orders, diagnosis for this visit:    There are no diagnoses linked to this encounter.  Return to clinic: Return for 2-3 weeks rash follow-up , w/ Dr. Raymund.  I, Jacquelynn V. Wilfred, CMA, am acting as scribe for Lauraine JAYSON Raymund, MD.  Documentation: I have reviewed the above documentation for accuracy and completeness, and I agree with the above.  Lauraine JAYSON Raymund, MD

## 2024-05-02 ENCOUNTER — Telehealth: Payer: Self-pay | Admitting: Oncology

## 2024-05-02 NOTE — Telephone Encounter (Signed)
 Pt would like to re-establish care, please advise on scheduling.

## 2024-05-02 NOTE — Telephone Encounter (Signed)
 Error. Another phone note has been documented.

## 2024-05-02 NOTE — Telephone Encounter (Signed)
 Pt called and requested to schedule a lab appt for Monday 11/3.  I told pt I would have to send a message to MD and get the okay to schedule anything since the last time pt was here was in 2024.  Please advise

## 2024-05-05 NOTE — Telephone Encounter (Signed)
 Please schedule and notify pt of appts: MD followed by labs -next avial- to re-establish care

## 2024-05-21 ENCOUNTER — Ambulatory Visit

## 2024-06-17 ENCOUNTER — Other Ambulatory Visit: Payer: Self-pay | Admitting: Family Medicine

## 2024-06-17 DIAGNOSIS — E1065 Type 1 diabetes mellitus with hyperglycemia: Secondary | ICD-10-CM

## 2024-06-24 ENCOUNTER — Inpatient Hospital Stay: Attending: Oncology | Admitting: Oncology

## 2024-06-24 ENCOUNTER — Encounter: Payer: Self-pay | Admitting: Oncology

## 2024-06-24 ENCOUNTER — Inpatient Hospital Stay

## 2024-06-24 ENCOUNTER — Telehealth: Payer: Self-pay

## 2024-06-24 VITALS — BP 115/75 | HR 79 | Temp 97.5°F | Resp 18 | Wt 131.6 lb

## 2024-06-24 DIAGNOSIS — Z87891 Personal history of nicotine dependence: Secondary | ICD-10-CM | POA: Diagnosis not present

## 2024-06-24 DIAGNOSIS — E538 Deficiency of other specified B group vitamins: Secondary | ICD-10-CM | POA: Insufficient documentation

## 2024-06-24 DIAGNOSIS — Z7901 Long term (current) use of anticoagulants: Secondary | ICD-10-CM | POA: Diagnosis not present

## 2024-06-24 DIAGNOSIS — Z86711 Personal history of pulmonary embolism: Secondary | ICD-10-CM | POA: Diagnosis not present

## 2024-06-24 DIAGNOSIS — I2699 Other pulmonary embolism without acute cor pulmonale: Secondary | ICD-10-CM

## 2024-06-24 DIAGNOSIS — Z79899 Other long term (current) drug therapy: Secondary | ICD-10-CM | POA: Insufficient documentation

## 2024-06-24 DIAGNOSIS — D509 Iron deficiency anemia, unspecified: Secondary | ICD-10-CM | POA: Diagnosis present

## 2024-06-24 DIAGNOSIS — N049 Nephrotic syndrome with unspecified morphologic changes: Secondary | ICD-10-CM | POA: Insufficient documentation

## 2024-06-24 DIAGNOSIS — D508 Other iron deficiency anemias: Secondary | ICD-10-CM | POA: Diagnosis not present

## 2024-06-24 LAB — IRON AND TIBC
Iron: 47 ug/dL (ref 28–170)
Saturation Ratios: 15 % (ref 10.4–31.8)
TIBC: 312 ug/dL (ref 250–450)
UIBC: 265 ug/dL

## 2024-06-24 LAB — CBC WITH DIFFERENTIAL (CANCER CENTER ONLY)
Abs Immature Granulocytes: 0.02 K/uL (ref 0.00–0.07)
Basophils Absolute: 0.1 K/uL (ref 0.0–0.1)
Basophils Relative: 1 %
Eosinophils Absolute: 0.1 K/uL (ref 0.0–0.5)
Eosinophils Relative: 2 %
HCT: 38.2 % (ref 36.0–46.0)
Hemoglobin: 13.1 g/dL (ref 12.0–15.0)
Immature Granulocytes: 0 %
Lymphocytes Relative: 24 %
Lymphs Abs: 1.7 K/uL (ref 0.7–4.0)
MCH: 28.7 pg (ref 26.0–34.0)
MCHC: 34.3 g/dL (ref 30.0–36.0)
MCV: 83.6 fL (ref 80.0–100.0)
Monocytes Absolute: 0.4 K/uL (ref 0.1–1.0)
Monocytes Relative: 5 %
Neutro Abs: 5 K/uL (ref 1.7–7.7)
Neutrophils Relative %: 68 %
Platelet Count: 339 K/uL (ref 150–400)
RBC: 4.57 MIL/uL (ref 3.87–5.11)
RDW: 13.1 % (ref 11.5–15.5)
WBC Count: 7.4 K/uL (ref 4.0–10.5)
nRBC: 0 % (ref 0.0–0.2)

## 2024-06-24 LAB — CMP (CANCER CENTER ONLY)
ALT: 9 U/L (ref 0–44)
AST: 17 U/L (ref 15–41)
Albumin: 4 g/dL (ref 3.5–5.0)
Alkaline Phosphatase: 93 U/L (ref 38–126)
Anion gap: 10 (ref 5–15)
BUN: 15 mg/dL (ref 6–20)
CO2: 22 mmol/L (ref 22–32)
Calcium: 9.2 mg/dL (ref 8.9–10.3)
Chloride: 103 mmol/L (ref 98–111)
Creatinine: 0.83 mg/dL (ref 0.44–1.00)
GFR, Estimated: >60 mL/min
Glucose, Bld: 303 mg/dL — ABNORMAL HIGH (ref 70–99)
Potassium: 4.5 mmol/L (ref 3.5–5.1)
Sodium: 135 mmol/L (ref 135–145)
Total Bilirubin: 0.3 mg/dL (ref 0.0–1.2)
Total Protein: 7 g/dL (ref 6.5–8.1)

## 2024-06-24 LAB — VITAMIN B12: Vitamin B-12: 335 pg/mL (ref 180–914)

## 2024-06-24 LAB — PROTIME-INR
INR: 1.1 (ref 0.8–1.2)
Prothrombin Time: 14.3 s (ref 11.4–15.2)

## 2024-06-24 LAB — FERRITIN: Ferritin: 69 ng/mL (ref 11–307)

## 2024-06-24 MED ORDER — WARFARIN SODIUM 5 MG PO TABS: 10.0000 mg | ORAL_TABLET | Freq: Every day | ORAL | 5 refills | Status: AC

## 2024-06-24 MED ORDER — WARFARIN SODIUM 1 MG PO TABS: 1.0000 mg | ORAL_TABLET | ORAL | 0 refills | Status: AC

## 2024-06-24 NOTE — Assessment & Plan Note (Signed)
 Lab Results  Component Value Date   HGB 13.1 06/24/2024   TIBC 312 06/24/2024   IRONPCTSAT 15 06/24/2024   FERRITIN 69 06/24/2024    Both hemoglobin and ferritin are stable.

## 2024-06-24 NOTE — Telephone Encounter (Signed)
 Per Dr. Babara INR is subtherapeutic. Please advise patient to increase Coumadin  dosage to 12 mg daily. I have sent refills to her pharmacy. Please arrange patient to repeat INR stat weekly x 3. Follow-up appointment in 2 months. Lab MD CBC B12 INR   Called and spoke to pt's mother and informed her of recommendation. She said she will relay message to pt. Upcoming appt details given to pt. Pt also able to see appt on Mychart.

## 2024-06-24 NOTE — Assessment & Plan Note (Signed)
 Currently on tacrolimus  5 mg twice daily Recommend patient to follow-up with nephrology

## 2024-06-24 NOTE — Assessment & Plan Note (Signed)
Recommend patient to continue  B12 supplementation.

## 2024-06-24 NOTE — Assessment & Plan Note (Addendum)
 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  This was considered as Eliquis  failure.  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 Recommend patient to continue coumadin  with goal of INR 2-3 Coumadin  dosage has been adjusted to 8mg  due to recent start of bactrim  PCP prefers hematology to continue manage her coumadin  dosing.  Today's INR is 1.1 patient is subtherapeutic., I recommend patient to increase coumadin  dosage to 12mg  daily

## 2024-06-24 NOTE — Progress Notes (Signed)
 " Hematology/Oncology Progress note [phone] [phone]      CHIEF COMPLAINTS/REASON FOR VISIT:  Follow up for bilateral pulmonary embolism, due to nephrotic syndrome.    ASSESSMENT & PLAN:   Pulmonary embolism (HCC) Pulmonary embolism due to hypercoagulability secondary to nephrotic syndrome/hypoalbuminemia She received mechanical embolectomy during last admission and had a recurrent pulmonary embolism despite taking Eliquis .  This was considered as Eliquis  failure.  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 Recommend patient to continue coumadin  with goal of INR 2-3 Coumadin  dosage has been adjusted to 8mg  due to recent start of bactrim  PCP prefers hematology to continue manage her coumadin  dosing.  Today's INR is 1.1 patient is subtherapeutic., I recommend patient to increase coumadin  dosage to 12mg  daily   IDA (iron  deficiency anemia) Lab Results  Component Value Date   HGB 13.1 06/24/2024   TIBC 312 06/24/2024   IRONPCTSAT 15 06/24/2024   FERRITIN 69 06/24/2024    Both hemoglobin and ferritin are stable.   Nephrotic syndrome Currently on tacrolimus  5 mg twice daily Recommend patient to follow-up with nephrology  Vitamin B12 deficiency Recommend patient to continue  B12 supplementation.    Orders Placed This Encounter  Procedures   CBC with Differential (Cancer Center Only)    Standing Status:   Future    Number of Occurrences:   1    Expected Date:   06/24/2024    Expiration Date:   09/22/2024   CMP (Cancer Center only)    Standing Status:   Future    Number of Occurrences:   1    Expected Date:   06/24/2024    Expiration Date:   09/22/2024   Protime-INR    Standing Status:   Future    Number of Occurrences:   1    Expected Date:   06/24/2024    Expiration Date:   09/22/2024   Vitamin B12    Standing Status:   Future    Number of Occurrences:   1    Expected Date:   06/24/2024    Expiration Date:    09/22/2024   Ferritin    Standing Status:   Future    Number of Occurrences:   1    Expected Date:   06/24/2024    Expiration Date:   09/22/2024   Iron  and TIBC    Standing Status:   Future    Number of Occurrences:   1    Expected Date:   06/24/2024    Expiration Date:   09/22/2024   Follow up 4 weeks.  All questions were answered. The patient knows to call the clinic with any problems, questions or concerns.  Zelphia Cap, MD, PhD [facility] Hematology Oncology 06/24/2024   HISTORY OF PRESENTING ILLNESS:   Catherine [name] is a  23 y.o.  female presents for follow up of bilateral pulmonary embolism 06/30/2022 - 07/05/2022 patient was admitted at [facility] due to AKI on CKD, proteinuria, hypoalbuminemia Ultrasound of bilateral lower extremity was negative for DVT. Patient was started on Eliquis  5 mg twice daily for prophylactic anticoagulation due to the concern of hypercoagulability secondary to hypoalbuminemia/nephrotic syndrome.  Patient took 30 days of Eliquis  and ran out after that.   08/22/2022 - 08/25/2022, patient was hospitalized at [facility] due to shortness of breath, 08/22/2021, CT chest PE: Showed acute submassive PE with right heart strain.  Multifocal bilateral upper lobe nodules-favor infectious process.  Multifocal groundglass opacities with subpleural sparing  likely reflects combination of pulmonary hemorrhage and/for any infectious or inflammatory etiology. Patient was treated with heparin  drip, transition to Eliquis . She underwent thrombolysis and mechanical embolectomy by Dr. Marea on 08/22/2022.   08/30/2022, patient returned to the emergency room due to shortness of breath. Repeat CT chest angiogram showed interval development of confluent patchy and posterior consolidation airspace disease in the right middle lobe suggesting infarct. Bulky bilateral pulmonary embolism again noted, occlusive in right middle lobe lobar branch and segmental branches to the right lower lobe, similar to  prior. Medial segmental and subsegmental branches to the right lower lobe are patent on the current study despite being occluded previously. Small volume occlusive thrombus in a subsegmental right upper lobe branches similar to prior.  No change in the nonocclusive thrombus in the segmental branches to the left lower lobe.  Enlargement of pulmonary outflow tract/main pulmonary arteries.  Cavitary lesions in the lateral and posterior right upper lobe.  Nodular and patchy areas of consolidative airspace disease Patient was restarted on heparin  drip and was transitioned to Lovenox  1mg /kg BID bridging to coumadin  with INR goal 2-3  Cavitary lesion, Patient was seen by infectious disease physician for pulmonary infiltrates.  Pulmonologist does not recommend bronchoscopy due to high risk.   10/18/2022 - 11/05/2022.  Patient was hospitalized, initially at [facility] for septic shock presumably from pneumonia, she required Levophed  vasopressor, covered with vancomycin  and cefepime , azithromycin .  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 She tolerated procedure.  Patient was on heparin  drip which bridged to Eliquis  at discharge. She is currently taking Sildenafil    10/26/22 right superior segment wedge resection showed - Mild to moderate pulmonary arterial hypertension with focal intraluminal organized thrombi and recanalization; features consistent with thromboembolic pulmonary hypertension.  - Patchy interstitial fibrosis with associated intraalveolar macrophages. See comment. - Granulomatous inflammation not identified  She followed up with pulmonology and was seen by Dr.Dgayli on 11/17/2022. There was concern  that the pathogen was not ideal choice for her. Dr.Dgayli communicated with with [facility] team and Dr.Rajagopal agrees that pulmonary is likely a better choice for her situation.  Patient today presents for transition to pulmonary.  She reports feeling well and be compliant on all her medications.  She is alone by herself today.  Her parents have been very involved in her care in the past.  I offered to call her parents during the visit and patient declined. She denies shortness of breath, chest pain.  She has also now establish care with primary care provider.  INTERVAL HISTORY Catherine [name] is a 23 y.o. female who has above history reviewed by me today presents for follow up visit for recurrent bilateral pulmonary embolism.   Patient is not compliant with follow-up and was last seen by us  in March 2025. She is maintained on Coumadin  10 mg daily for anticoagulation. She has not had regular INR monitoring.  Her primary care provider has refilled her Coumadin  prescription. She has not missed any doses and denies bleeding or thrombotic symptoms.  Patient is on tacrolimus  for nephrotic syndrome.  Last seen nephrology in May 2025.  Patient has no new complaints.   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   Nephrotic syndrome 08/23/2019   Pulmonary embolism (HCC) 08/30/2022   Sepsis associated hypotension (HCC) 10/14/2022   Severe sepsis with septic shock (CODE) (HCC) 09/26/2021   Tobacco dependence     SURGICAL HISTORY: Past Surgical History:  Procedure Laterality Date   PULMONARY THROMBECTOMY Bilateral 08/22/2022   Procedure: PULMONARY THROMBECTOMY;  Surgeon: Marea Selinda RAMAN, MD;  Location: [facility] INVASIVE CV LAB;  Service: Cardiovascular;  Laterality: Bilateral;    SOCIAL HISTORY: Social  History   Socioeconomic History   Marital status: Single    Spouse name: Not on file   Number of children: Not on file   Years of education: Not on file   Highest education level: Not on file  Occupational History   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  Tobacco Use   Smoking status: Former    Types: E-cigarettes    Quit date: 07/2022    Years since quitting: 1.9   Smokeless tobacco: Never   Tobacco comments:    Quit substance use since the beginning of the year   Vaping Use   Vaping status: Former  Substance and Sexual Activity   Alcohol use: Not Currently   Drug use: Yes    Types: Marijuana    Comment: Quit using marijuana approx earlier this year   Sexual activity: Not Currently  Other Topics Concern   Not on file  Social History Narrative   Not on file   Social Drivers of Health   Tobacco Use: Medium Risk (06/24/2024)   Patient History    Smoking Tobacco Use: Former    Smokeless Tobacco Use: Never    Passive Exposure: Not on file  Financial Resource Strain: Patient Declined (10/18/2022)   Received from [facility]   Overall Financial Resource Strain (CARDIA)    Difficulty of Paying Living Expenses: Patient declined  Food Insecurity: Patient Declined (10/18/2022)   Received from [facility]   Epic    Within the past 12 months, you worried that your food would run out before you got the money to buy more.: Patient declined    Within the past 12 months, the food you bought just didn't last and you didn't have money to get more.: Patient declined  Transportation Needs: No Transportation Needs (10/19/2022)   Received from [facility] - Transportation    In the past 12 months, has lack of transportation kept you from medical appointments or from getting medications?: No    Lack of Transportation (Non-Medical): No  Physical Activity: Not on file  Stress: Not on file  Social Connections: Not on file  Intimate  Partner Violence: Not At Risk (10/14/2022)   Humiliation, Afraid, Rape, and Kick questionnaire    Fear of Current or Ex-Partner: No    Emotionally Abused: No    Physically Abused: No    Sexually Abused: No  Depression (PHQ2-9): Low Risk (01/15/2023)   Depression (PHQ2-9)    PHQ-2 Score: 0  Alcohol Screen: Low Risk (11/21/2022)   Alcohol Screen    Last Alcohol Screening Score (AUDIT): 0  Housing: Patient Declined (10/18/2022)   Received from [facility]   Epic    In the last 12 months, was there a time when you were not able to pay the mortgage or rent on time?: Patient declined    In the  past 12 months, how many times have you moved where you were living?: 1    At any time in the past 12 months, were you homeless or living in a shelter (including now)?: Patient declined  Utilities: Patient Declined (10/18/2022)   Received from [facility] Utilities    Threatened with loss of utilities: Patient declined  Health Literacy: Not on file    FAMILY HISTORY: History reviewed. No pertinent family history.  ALLERGIES:  has no known allergies.  MEDICATIONS:  Current Outpatient Medications  Medication Sig Dispense Refill   acetaminophen  (TYLENOL ) 325 MG tablet Take 2 tablets (650 mg total) by mouth every 6 (six) hours as needed for mild pain or headache. 20 tablet 0   blood glucose meter kit and supplies Dispense based on patient and insurance preference. Use up to four times daily as directed. (FOR ICD-10 E10.9, E11.9). 1 each 0   cetirizine  (ZYRTEC ) 10 MG tablet Take 1 tablet (10 mg total) by mouth daily. 30 tablet 11   clobetasol  ointment (TEMOVATE ) 0.05 % Apply 1 gram topically to affected area of skin twice daily. Stop once resolved and restart as needed for flares. Avoid use on face, armpits, groin unless otherwise indicated. 60 g 5   clotrimazole  (CLOTRIMAZOLE  ANTI-FUNGAL) 1 % cream Apply 1 Application topically 2 (two) times daily. 30 g 0    cyanocobalamin  (VITAMIN B12) 1000 MCG tablet Take 1 tablet (1,000 mcg total) by mouth daily. (Patient taking differently: Take 1,000 mcg by mouth as needed.) 30 tablet 2   furosemide  (LASIX ) 20 MG tablet Take 20 mg by mouth daily.     glucagon 1 MG injection Inject 1 mg into the muscle as needed.     hydrocortisone  2.5 % ointment Apply topically 2 (two) times daily. 30 g 0   hydrOXYzine  (VISTARIL ) 25 MG capsule TAKE 1 CAPSULE(25 MG) BY MOUTH THREE TIMES DAILY AS NEEDED FOR ANXIETY 60 capsule 2   insulin  aspart (NOVOLOG ) 100 UNIT/ML injection ADMINISTER 5 TO 8 UNITS UNDER THE SKIN THREE TIMES DAILY BEFORE MEALS 10 mL 2   Insulin  Syringe-Needle U-100 (INSULIN  SYRINGE .3CC/31GX5/16) 31G X 5/16 0.3 ML MISC USE TO INJECT FOUR TIMES DAILY 100 each 6   LANTUS  100 UNIT/ML injection Inject 0.15 mLs (15 Units total) into the skin at bedtime. 4.5 mL 2   norethindrone  (AYGESTIN ) 5 MG tablet Take 1 tablet (5 mg total) by mouth daily for 10 days. 10 tablet 0   ondansetron  (ZOFRAN -ODT) 4 MG disintegrating tablet Take 4 mg by mouth every 8 (eight) hours as needed for nausea.     torsemide  (DEMADEX ) 20 MG tablet Take 1 tablet (20 mg total) by mouth daily. (Patient taking differently: Take 20 mg by mouth as needed.) 30 tablet 0   triamcinolone  ointment (KENALOG ) 0.1 % Apply 7 grams twice daily to affected areas of skin. Stop once resolved and restart as needed for flares. Avoid use on face, armpits, groin unless otherwise indicated. 454 g 2   trimethoprim -polymyxin b (POLYTRIM) ophthalmic solution Place 2 drops into both eyes every 6 (six) hours.     warfarin (COUMADIN ) 5 MG tablet Take 2 tablets (10 mg total) by mouth daily. 60 tablet 5   atorvastatin  (LIPITOR) 40 MG tablet Take 40 mg by mouth daily. (Patient not taking: Reported on 06/24/2024)     calcitRIOL  (ROCALTROL ) 0.25 MCG capsule Take 1 capsule (0.25 mcg total) by mouth as directed. Take 1 capsule (0.25 mcg) three times a week. (Patient  not taking: Reported  on 06/24/2024) 60 capsule 2   Continuous Glucose Sensor (DEXCOM G7 SENSOR) MISC 5 each by Other route every 14 (fourteen) days. (Patient not taking: Reported on 06/24/2024)     Iron -Vitamin C  65-125 MG TABS Take 1 tablet by mouth daily. (Patient not taking: Reported on 06/24/2024) 30 tablet 2   Multiple Vitamin (MULTIVITAMIN WITH MINERALS) TABS tablet Take 1 tablet by mouth daily. (Patient not taking: Reported on 06/24/2024)     predniSONE  (DELTASONE ) 10 MG tablet 10 mg as needed. (Patient not taking: Reported on 06/24/2024)     predniSONE  (DELTASONE ) 20 MG tablet TAKE 1 TABLET(20 MG) BY MOUTH DAILY WITH BREAKFAST FOR 7 DAYS (Patient not taking: Reported on 06/24/2024) 7 tablet 0   sildenafil  (REVATIO ) 20 MG tablet Take 20 mg by mouth 3 (three) times daily. (Patient not taking: Reported on 06/24/2024)     sulfamethoxazole -trimethoprim  (BACTRIM  DS) 800-160 MG tablet Take 1 tablet by mouth 3 (three) times a week. (Patient not taking: Reported on 06/24/2024) 45 tablet 0   traZODone  (DESYREL ) 50 MG tablet Take 0.5-1 tablets (25-50 mg total) by mouth at bedtime as needed for sleep. (Patient not taking: Reported on 06/24/2024) 30 tablet 0   warfarin (COUMADIN ) 1 MG tablet Take 1 tablet (1 mg total) by mouth See admin instructions. Follow MD instruction for coumadin  dosing. (Patient not taking: Reported on 06/24/2024) 60 tablet 0   No current facility-administered medications for this visit.    Review of Systems  Constitutional:  Negative for appetite change, chills, fatigue and fever.  HENT:   Negative for hearing loss and voice change.   Eyes:  Negative for eye problems.  Respiratory:  Negative for chest tightness and cough.   Cardiovascular:  Negative for chest pain.  Gastrointestinal:  Negative for abdominal distention, abdominal pain and blood in stool.  Endocrine: Negative for hot flashes.  Genitourinary:  Negative for difficulty urinating and frequency.   Musculoskeletal:  Negative for  arthralgias.  Skin:  Negative for itching and rash.  Neurological:  Negative for extremity weakness.  Hematological:  Negative for adenopathy.  Psychiatric/Behavioral:  Negative for confusion.    PHYSICAL EXAMINATION:  Vitals:   06/24/24 1128  BP: 115/75  Pulse: 79  Resp: 18  Temp: (!) 97.5 F (36.4 C)  SpO2: 100%   Filed Weights   06/24/24 1128  Weight: 131 lb 9.6 oz (59.7 kg)    Physical Exam Constitutional:      General: She is not in acute distress. HENT:     Head: Normocephalic and atraumatic.  Eyes:     General: No scleral icterus. Cardiovascular:     Rate and Rhythm: Normal rate.  Pulmonary:     Effort: Pulmonary effort is normal. No respiratory distress.  Abdominal:     General: Bowel sounds are normal. There is no distension.     Palpations: Abdomen is soft.  Musculoskeletal:        General: No deformity. Normal range of motion.     Cervical back: Normal range of motion and neck supple.  Skin:    General: Skin is warm and dry.     Findings: No erythema or rash.  Neurological:     Mental Status: She is alert and oriented to person, place, and time. Mental status is at baseline.  Psychiatric:        Mood and Affect: Mood normal.     LABORATORY DATA:  I have reviewed the data as listed    Latest  Ref Rng & Units 06/24/2024   12:28 PM 09/10/2023   10:10 AM 04/19/2023    2:05 PM  CBC  WBC 4.0 - 10.5 K/uL 7.4  5.9  6.7   Hemoglobin 12.0 - 15.0 g/dL 86.8  86.2  86.8   Hematocrit 36.0 - 46.0 % 38.2  41.4  39.2   Platelets 150 - 400 K/uL 339  334  365       Latest Ref Rng & Units 06/24/2024   12:29 PM 03/21/2023    9:53 AM 02/20/2023   10:48 AM  CMP  Glucose 70 - 99 mg/dL 696  758  738   BUN 6 - 20 mg/dL 15  30  34   Creatinine 0.44 - 1.00 mg/dL 9.16  9.09  8.87   Sodium 135 - 145 mmol/L 135  135  132   Potassium 3.5 - 5.1 mmol/L 4.5  4.1  4.2   Chloride 98 - 111 mmol/L 103  109  106   CO2 22 - 32 mmol/L 22  22  18    Calcium  8.9 - 10.3 mg/dL 9.2   8.9  9.1   Total Protein 6.5 - 8.1 g/dL 7.0  7.0  7.9   Total Bilirubin 0.0 - 1.2 mg/dL 0.3  0.5  0.4   Alkaline Phos 38 - 126 U/L 93  103  113   AST 15 - 41 U/L 17  18  29    ALT 0 - 44 U/L 9  14  26        RADIOGRAPHIC STUDIES: I have personally reviewed the radiological images as listed and agreed with the findings in the report. No results found.       "

## 2024-06-30 ENCOUNTER — Ambulatory Visit: Admitting: Family Medicine

## 2024-06-30 ENCOUNTER — Encounter: Payer: Self-pay | Admitting: Family Medicine

## 2024-06-30 VITALS — BP 114/71 | HR 98 | Ht 59.0 in | Wt 131.3 lb

## 2024-06-30 DIAGNOSIS — Z794 Long term (current) use of insulin: Secondary | ICD-10-CM | POA: Diagnosis not present

## 2024-06-30 DIAGNOSIS — E538 Deficiency of other specified B group vitamins: Secondary | ICD-10-CM | POA: Diagnosis not present

## 2024-06-30 DIAGNOSIS — N050 Unspecified nephritic syndrome with minor glomerular abnormality: Secondary | ICD-10-CM | POA: Diagnosis not present

## 2024-06-30 DIAGNOSIS — E785 Hyperlipidemia, unspecified: Secondary | ICD-10-CM

## 2024-06-30 DIAGNOSIS — I152 Hypertension secondary to endocrine disorders: Secondary | ICD-10-CM | POA: Diagnosis not present

## 2024-06-30 DIAGNOSIS — E1021 Type 1 diabetes mellitus with diabetic nephropathy: Secondary | ICD-10-CM | POA: Diagnosis not present

## 2024-06-30 DIAGNOSIS — F5102 Adjustment insomnia: Secondary | ICD-10-CM

## 2024-06-30 DIAGNOSIS — F419 Anxiety disorder, unspecified: Secondary | ICD-10-CM | POA: Diagnosis not present

## 2024-06-30 DIAGNOSIS — L231 Allergic contact dermatitis due to adhesives: Secondary | ICD-10-CM

## 2024-06-30 DIAGNOSIS — E1065 Type 1 diabetes mellitus with hyperglycemia: Secondary | ICD-10-CM | POA: Diagnosis not present

## 2024-06-30 DIAGNOSIS — N049 Nephrotic syndrome with unspecified morphologic changes: Secondary | ICD-10-CM

## 2024-06-30 DIAGNOSIS — D508 Other iron deficiency anemias: Secondary | ICD-10-CM

## 2024-06-30 DIAGNOSIS — I2724 Chronic thromboembolic pulmonary hypertension: Secondary | ICD-10-CM

## 2024-06-30 MED ORDER — ATORVASTATIN CALCIUM 40 MG PO TABS: 40.0000 mg | ORAL_TABLET | Freq: Every day | ORAL | 3 refills | Status: AC

## 2024-06-30 MED ORDER — MULTI-VITAMIN/MINERALS PO TABS: 1.0000 | ORAL_TABLET | Freq: Every day | ORAL | 3 refills | Status: AC

## 2024-06-30 NOTE — Patient Instructions (Signed)
 To keep you healthy, please keep in mind the following health maintenance items that you are due for:   Health Maintenance Due  Topic Date Due   OPHTHALMOLOGY EXAM  Never done   Diabetic kidney evaluation - Urine ACR  04/18/2021   HEMOGLOBIN A1C  02/23/2023     Best Wishes,   Dr. Lang

## 2024-06-30 NOTE — Progress Notes (Unsigned)
" ° °  Established Patient Office Visit  Patient ID: Catherine [name], female    DOB: [birth year]  Age: 23 y.o. MRN: [id number] PCP: Sharma Coyer, MD  Chief Complaint  Patient presents with   Medical Management of Chronic Issues    Patient is present for f/u with pcp doing well overall     Subjective:     HPI  Discussed the use of AI scribe software for clinical note transcription with the patient, who gave verbal consent to proceed.  History of Present Illness    {History (Optional):23778}  ROS    Objective:     BP 114/71 (BP Location: Right Arm, Patient Position: Sitting, Cuff Size: Normal)   Pulse 98   Ht 4' 11 (1.499 m)   Wt 131 lb 4.8 oz (59.6 kg)   SpO2 100%   BMI 26.52 kg/m  BP Readings from Last 3 Encounters:  06/30/24 114/71  06/24/24 115/75  04/09/24 111/77   Wt Readings from Last 3 Encounters:  06/30/24 131 lb 4.8 oz (59.6 kg)  06/24/24 131 lb 9.6 oz (59.7 kg)  04/09/24 128 lb 9.6 oz (58.3 kg)      Physical Exam  {PhysExam Abridge (Optional):210964309} No results found for any visits on 06/30/24.  Last CBC Lab Results  Component Value Date   WBC 7.4 06/24/2024   HGB 13.1 06/24/2024   HCT 38.2 06/24/2024   MCV 83.6 06/24/2024   MCH 28.7 06/24/2024   RDW 13.1 06/24/2024   PLT 339 06/24/2024   Last metabolic panel Lab Results  Component Value Date   GLUCOSE 303 (H) 06/24/2024   NA 135 06/24/2024   K 4.5 06/24/2024   CL 103 06/24/2024   CO2 22 06/24/2024   BUN 15 06/24/2024   CREATININE 0.83 06/24/2024   GFRNONAA >60 06/24/2024   CALCIUM  9.2 06/24/2024   PHOS 3.0 10/18/2022   PROT 7.0 06/24/2024   ALBUMIN  4.0 06/24/2024   LABGLOB 2.9 08/23/2022   AGRATIO 0.2 (L) 08/23/2022   BILITOT 0.3 06/24/2024   ALKPHOS 93 06/24/2024   AST 17 06/24/2024   ALT 9 06/24/2024   ANIONGAP 10 06/24/2024   Last lipids Lab Results  Component Value Date   CHOL 132 01/01/2023   HDL 58 01/01/2023   LDLCALC 56 01/01/2023   TRIG 90  01/01/2023   CHOLHDL 2.3 01/01/2023   Last hemoglobin A1c Lab Results  Component Value Date   HGBA1C 9.1 (H) 08/25/2022      The ASCVD Risk score (Arnett DK, et al., 2019) failed to calculate for the following reasons:   The 2019 ASCVD risk score is only valid for ages 76 to 61   * - Cholesterol units were assumed    Assessment & Plan:   Problem List Items Addressed This Visit     Adjustment insomnia - Primary   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    Coyer Sharma, MD [facility]   "

## 2024-07-01 ENCOUNTER — Encounter: Payer: Self-pay | Admitting: Family Medicine

## 2024-07-01 ENCOUNTER — Inpatient Hospital Stay

## 2024-07-01 DIAGNOSIS — I2699 Other pulmonary embolism without acute cor pulmonale: Secondary | ICD-10-CM

## 2024-07-01 DIAGNOSIS — D509 Iron deficiency anemia, unspecified: Secondary | ICD-10-CM | POA: Diagnosis not present

## 2024-07-01 LAB — PROTIME-INR
INR: 2.6 — ABNORMAL HIGH (ref 0.8–1.2)
Prothrombin Time: 29.4 s — ABNORMAL HIGH (ref 11.4–15.2)

## 2024-07-01 LAB — MICROALBUMIN / CREATININE URINE RATIO
Creatinine, Urine: 239.8 mg/dL
Microalb/Creat Ratio: 99 mg/g{creat} — ABNORMAL HIGH (ref 0–29)
Microalbumin, Urine: 237.3 ug/mL

## 2024-07-01 LAB — SPECIMEN STATUS REPORT

## 2024-07-01 NOTE — Assessment & Plan Note (Signed)
 Chronic  Follows with hematology

## 2024-07-01 NOTE — Assessment & Plan Note (Signed)
 Chronic  Associated with minimal change disease, nephrotic syndrome Follows with nephrology  Lasix  20mg  daily

## 2024-07-01 NOTE — Assessment & Plan Note (Signed)
 Chronic  Continue PRN hydroxyzine  25mg  q8 hrs

## 2024-07-01 NOTE — Assessment & Plan Note (Signed)
 Dyslipidemia Chronic  Management with atorvastatin . Cholesterol levels not checked in a year.Pt prefers to hold off on checking cholesterol levels today due to upcoming labs with her specialist in the next few weeks  - Prescribed atorvastatin  40 mg for pt to resume

## 2024-07-01 NOTE — Assessment & Plan Note (Signed)
 Chronic  Follow up with nephrology as scheduled

## 2024-07-01 NOTE — Assessment & Plan Note (Signed)
 Vitamin B12 deficiency Chronic  Managed with B12 supplementation as needed. Hematologist monitors B12 levels. - Continue B12 supplementation as needed. - continue to follow up with hematology as scheduled

## 2024-07-01 NOTE — Assessment & Plan Note (Signed)
 Chronic  Follow up with nephrology  No prednisone  currently Continue tacrolimus  5mg  BID

## 2024-07-01 NOTE — Assessment & Plan Note (Signed)
 Nephrotic syndrome Chronic  Continue to follow up with nephrology as scheduled  Managed with Lasix  and torsemide  as needed for fluid retention. Calcitriol  discontinued by nephrology. - Continue Lasix  and torsemide  as needed for fluid retention. - continue tacrolimus  5mg  BID

## 2024-07-01 NOTE — Assessment & Plan Note (Signed)
 Resolved  Pt has been evaluated by derm and symptoms improved with clobetasol , antifungal topical and hydrocortisone  2.5%

## 2024-07-01 NOTE — Assessment & Plan Note (Signed)
 Chronic  INR goal 2-3 On coumadin  as directed by hematology  F/u with hematology as scheduled, last visit 06/24/2024 Currently on 12mg  of warfarin daily

## 2024-07-01 NOTE — Assessment & Plan Note (Signed)
 Type 1 diabetes mellitus Chronic  Managed with Lantus , Novolog , and Dexcom. Blood pressure is well-controlled. No recent eye exam, but one is scheduled. Foot exam performed with good pulses and sensation. - Continue Lantus  18units nightly (adding 1 unit to nightly dose every 3 days to max of 25 units for fasting glucose greater than 150), Novolog  1 unit for every 8grams of carbs and 1 unit for every 50 over 150 premeal glucose with max of 50 units per day, and Dexcom for continuous glucose management. - Pt will attend reported scheduled eye exam with [facility]  - Continue follow-up with endocrinology on January 14th

## 2024-07-08 ENCOUNTER — Inpatient Hospital Stay: Attending: Oncology

## 2024-07-08 DIAGNOSIS — Z86711 Personal history of pulmonary embolism: Secondary | ICD-10-CM | POA: Diagnosis present

## 2024-07-08 DIAGNOSIS — Z7901 Long term (current) use of anticoagulants: Secondary | ICD-10-CM | POA: Diagnosis present

## 2024-07-08 DIAGNOSIS — I2699 Other pulmonary embolism without acute cor pulmonale: Secondary | ICD-10-CM

## 2024-07-08 LAB — PROTIME-INR
INR: 3.2 — ABNORMAL HIGH (ref 0.8–1.2)
Prothrombin Time: 33.9 s — ABNORMAL HIGH (ref 11.4–15.2)

## 2024-07-09 ENCOUNTER — Ambulatory Visit: Payer: Self-pay | Admitting: Family Medicine

## 2024-07-15 ENCOUNTER — Other Ambulatory Visit: Payer: Self-pay

## 2024-07-15 ENCOUNTER — Telehealth: Payer: Self-pay | Admitting: *Deleted

## 2024-07-15 ENCOUNTER — Inpatient Hospital Stay

## 2024-07-15 DIAGNOSIS — I2699 Other pulmonary embolism without acute cor pulmonale: Secondary | ICD-10-CM

## 2024-07-15 DIAGNOSIS — Z86711 Personal history of pulmonary embolism: Secondary | ICD-10-CM | POA: Diagnosis not present

## 2024-07-15 LAB — PROTIME-INR
INR: 4.4 (ref 0.8–1.2)
Prothrombin Time: 44.1 s — ABNORMAL HIGH (ref 11.4–15.2)

## 2024-07-15 NOTE — Telephone Encounter (Addendum)
 [facility]- coag dept called with critical INR level of 4.4. read back process performed with lab tech. 1142-Dr. Yu/team informed of critical value. Read back process performed.  Per Almarie, RN Dr. Babara to made recommendations on coumadin  adjustments

## 2024-07-22 ENCOUNTER — Inpatient Hospital Stay

## 2024-07-22 DIAGNOSIS — I2699 Other pulmonary embolism without acute cor pulmonale: Secondary | ICD-10-CM

## 2024-07-22 DIAGNOSIS — Z86711 Personal history of pulmonary embolism: Secondary | ICD-10-CM | POA: Diagnosis not present

## 2024-07-22 LAB — PROTIME-INR
INR: 1.5 — ABNORMAL HIGH (ref 0.8–1.2)
Prothrombin Time: 18.7 s — ABNORMAL HIGH (ref 11.4–15.2)

## 2024-07-23 ENCOUNTER — Other Ambulatory Visit: Payer: Self-pay

## 2024-07-23 ENCOUNTER — Telehealth: Payer: Self-pay

## 2024-07-23 NOTE — Telephone Encounter (Signed)
 Called and spoke to patient's mother and informed her of Coumadin  dosage change per Dr. Babara. I formed mother that Coumadin  dose should be Coumadin  12mg  on Monday, Wednesday, Friday, Sunday and Coumadin  10mg  on Tuesday, Thursday, and Saturday. Patient's mother gave verbal understanding to this.

## 2024-07-25 ENCOUNTER — Other Ambulatory Visit: Payer: Self-pay

## 2024-07-25 DIAGNOSIS — I2699 Other pulmonary embolism without acute cor pulmonale: Secondary | ICD-10-CM

## 2024-07-29 ENCOUNTER — Inpatient Hospital Stay

## 2024-07-30 ENCOUNTER — Inpatient Hospital Stay

## 2024-07-30 DIAGNOSIS — I2699 Other pulmonary embolism without acute cor pulmonale: Secondary | ICD-10-CM

## 2024-07-30 DIAGNOSIS — Z86711 Personal history of pulmonary embolism: Secondary | ICD-10-CM | POA: Diagnosis not present

## 2024-07-30 LAB — PROTIME-INR
INR: 1.8 — ABNORMAL HIGH (ref 0.8–1.2)
Prothrombin Time: 21.8 s — ABNORMAL HIGH (ref 11.4–15.2)

## 2024-08-05 ENCOUNTER — Inpatient Hospital Stay

## 2024-08-05 DIAGNOSIS — I2699 Other pulmonary embolism without acute cor pulmonale: Secondary | ICD-10-CM

## 2024-08-05 LAB — PROTIME-INR
INR: 2.3 — ABNORMAL HIGH (ref 0.8–1.2)
Prothrombin Time: 26.7 s — ABNORMAL HIGH (ref 11.4–15.2)

## 2024-08-26 ENCOUNTER — Inpatient Hospital Stay

## 2024-08-26 ENCOUNTER — Inpatient Hospital Stay: Admitting: Oncology
